# Patient Record
Sex: Female | Born: 1965 | Race: White | Hispanic: No | Marital: Married | Smoking: Former smoker
Health system: Southern US, Community
[De-identification: ages and names within clinical notes are randomized; demographics above are authoritative.]

## PROBLEM LIST (undated history)

## (undated) ENCOUNTER — Ambulatory Visit: Discharge status: Absent without leave | Payer: Medicare Other

## (undated) DIAGNOSIS — J439 Emphysema, unspecified: Secondary | ICD-10-CM

## (undated) DIAGNOSIS — M1990 Unspecified osteoarthritis, unspecified site: Secondary | ICD-10-CM

## (undated) DIAGNOSIS — N3010 Interstitial cystitis (chronic) without hematuria: Secondary | ICD-10-CM

## (undated) DIAGNOSIS — K219 Gastro-esophageal reflux disease without esophagitis: Secondary | ICD-10-CM

## (undated) DIAGNOSIS — F329 Major depressive disorder, single episode, unspecified: Secondary | ICD-10-CM

## (undated) DIAGNOSIS — F419 Anxiety disorder, unspecified: Secondary | ICD-10-CM

## (undated) DIAGNOSIS — F50029 Anorexia nervosa, binge eating/purging type, unspecified: Secondary | ICD-10-CM

## (undated) DIAGNOSIS — G709 Myoneural disorder, unspecified: Secondary | ICD-10-CM

## (undated) DIAGNOSIS — IMO0002 Reserved for concepts with insufficient information to code with codable children: Secondary | ICD-10-CM

## (undated) DIAGNOSIS — J069 Acute upper respiratory infection, unspecified: Secondary | ICD-10-CM

## (undated) DIAGNOSIS — L309 Dermatitis, unspecified: Secondary | ICD-10-CM

## (undated) DIAGNOSIS — N189 Chronic kidney disease, unspecified: Secondary | ICD-10-CM

## (undated) DIAGNOSIS — T7840XA Allergy, unspecified, initial encounter: Secondary | ICD-10-CM

## (undated) DIAGNOSIS — I10 Essential (primary) hypertension: Secondary | ICD-10-CM

## (undated) DIAGNOSIS — F319 Bipolar disorder, unspecified: Secondary | ICD-10-CM

## (undated) DIAGNOSIS — F429 Obsessive-compulsive disorder, unspecified: Secondary | ICD-10-CM

## (undated) DIAGNOSIS — K228 Other specified diseases of esophagus: Secondary | ICD-10-CM

## (undated) DIAGNOSIS — I499 Cardiac arrhythmia, unspecified: Secondary | ICD-10-CM

## (undated) DIAGNOSIS — F32A Depression, unspecified: Secondary | ICD-10-CM

## (undated) DIAGNOSIS — K2281 Esophageal polyp: Secondary | ICD-10-CM

## (undated) DIAGNOSIS — F5002 Anorexia nervosa, binge eating/purging type: Secondary | ICD-10-CM

## (undated) HISTORY — DX: Esophageal polyp: K22.81

## (undated) HISTORY — PX: CHOLECYSTECTOMY: SHX55

## (undated) HISTORY — DX: Anxiety disorder, unspecified: F41.9

## (undated) HISTORY — DX: Allergy, unspecified, initial encounter: T78.40XA

## (undated) HISTORY — PX: OTHER SURGICAL HISTORY: SHX169

## (undated) HISTORY — PX: BLADDER SURGERY: SHX569

## (undated) HISTORY — DX: Myoneural disorder, unspecified: G70.9

## (undated) HISTORY — DX: Reserved for concepts with insufficient information to code with codable children: IMO0002

## (undated) HISTORY — DX: Gastro-esophageal reflux disease without esophagitis: K21.9

## (undated) HISTORY — DX: Chronic kidney disease, unspecified: N18.9

## (undated) HISTORY — DX: Acute upper respiratory infection, unspecified: J06.9

## (undated) HISTORY — PX: VOCAL CORD LATERALIZATION, ENDOSCOPIC APPROACH W/ MLB: SHX2664

## (undated) HISTORY — DX: Emphysema, unspecified: J43.9

## (undated) HISTORY — DX: Depression, unspecified: F32.A

## (undated) HISTORY — PX: COSMETIC SURGERY: SHX468

## (undated) HISTORY — DX: Dermatitis, unspecified: L30.9

---
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
## 1997-11-20 ENCOUNTER — Other Ambulatory Visit: Admission: RE | Admit: 1997-11-20 | Discharge: 1997-11-20 | Payer: Self-pay | Admitting: Obstetrics & Gynecology

## 1998-04-23 ENCOUNTER — Encounter: Admission: RE | Admit: 1998-04-23 | Discharge: 1998-04-23 | Payer: Self-pay | Admitting: Otolaryngology

## 1998-04-29 ENCOUNTER — Ambulatory Visit (HOSPITAL_BASED_OUTPATIENT_CLINIC_OR_DEPARTMENT_OTHER): Admission: RE | Admit: 1998-04-29 | Discharge: 1998-04-29 | Payer: Self-pay | Admitting: Otolaryngology

## 1998-05-13 ENCOUNTER — Encounter: Admission: RE | Admit: 1998-05-13 | Discharge: 1998-06-07 | Payer: Self-pay | Admitting: Otolaryngology

## 1999-03-26 ENCOUNTER — Other Ambulatory Visit: Admission: RE | Admit: 1999-03-26 | Discharge: 1999-03-26 | Payer: Self-pay | Admitting: Obstetrics and Gynecology

## 2000-06-08 ENCOUNTER — Other Ambulatory Visit: Admission: RE | Admit: 2000-06-08 | Discharge: 2000-06-08 | Payer: Self-pay | Admitting: Obstetrics and Gynecology

## 2000-12-24 ENCOUNTER — Emergency Department (HOSPITAL_COMMUNITY): Admission: EM | Admit: 2000-12-24 | Discharge: 2000-12-25 | Payer: Self-pay

## 2003-04-03 ENCOUNTER — Other Ambulatory Visit: Admission: RE | Admit: 2003-04-03 | Discharge: 2003-04-03 | Payer: Self-pay | Admitting: Obstetrics & Gynecology

## 2003-08-13 ENCOUNTER — Encounter (INDEPENDENT_AMBULATORY_CARE_PROVIDER_SITE_OTHER): Payer: Self-pay | Admitting: Specialist

## 2003-08-13 ENCOUNTER — Ambulatory Visit (HOSPITAL_COMMUNITY): Admission: RE | Admit: 2003-08-13 | Discharge: 2003-08-13 | Payer: Self-pay | Admitting: Obstetrics & Gynecology

## 2004-07-04 ENCOUNTER — Other Ambulatory Visit: Admission: RE | Admit: 2004-07-04 | Discharge: 2004-07-04 | Payer: Self-pay | Admitting: Obstetrics & Gynecology

## 2004-11-28 ENCOUNTER — Encounter (INDEPENDENT_AMBULATORY_CARE_PROVIDER_SITE_OTHER): Payer: Self-pay | Admitting: Specialist

## 2004-11-28 ENCOUNTER — Ambulatory Visit (HOSPITAL_COMMUNITY): Admission: RE | Admit: 2004-11-28 | Discharge: 2004-11-28 | Payer: Self-pay | Admitting: Urology

## 2004-11-28 ENCOUNTER — Ambulatory Visit (HOSPITAL_BASED_OUTPATIENT_CLINIC_OR_DEPARTMENT_OTHER): Admission: RE | Admit: 2004-11-28 | Discharge: 2004-11-28 | Payer: Self-pay | Admitting: Urology

## 2005-10-22 ENCOUNTER — Emergency Department (HOSPITAL_COMMUNITY): Admission: EM | Admit: 2005-10-22 | Discharge: 2005-10-22 | Payer: Self-pay | Admitting: Emergency Medicine

## 2006-06-05 ENCOUNTER — Inpatient Hospital Stay (HOSPITAL_COMMUNITY): Admission: AD | Admit: 2006-06-05 | Discharge: 2006-06-06 | Payer: Self-pay | Admitting: Obstetrics & Gynecology

## 2006-06-05 ENCOUNTER — Encounter (INDEPENDENT_AMBULATORY_CARE_PROVIDER_SITE_OTHER): Payer: Self-pay | Admitting: Obstetrics & Gynecology

## 2008-04-05 ENCOUNTER — Encounter: Admission: RE | Admit: 2008-04-05 | Discharge: 2008-04-05 | Payer: Self-pay | Admitting: Family Medicine

## 2010-03-15 ENCOUNTER — Emergency Department (HOSPITAL_COMMUNITY)
Admission: EM | Admit: 2010-03-15 | Discharge: 2010-03-15 | Disposition: A | Discharge status: Home / Self Care | Payer: BC Managed Care – PPO | Attending: Emergency Medicine | Admitting: Emergency Medicine

## 2010-03-15 DIAGNOSIS — Z79899 Other long term (current) drug therapy: Secondary | ICD-10-CM | POA: Insufficient documentation

## 2010-03-15 DIAGNOSIS — H9209 Otalgia, unspecified ear: Secondary | ICD-10-CM | POA: Insufficient documentation

## 2010-03-15 DIAGNOSIS — F411 Generalized anxiety disorder: Secondary | ICD-10-CM | POA: Insufficient documentation

## 2010-06-06 NOTE — Op Note (Signed)
[name], [name]                ACCOUNT NO.:  [id number]   MEDICAL RECORD NO.:  [id number]          PATIENT TYPE:  AMB   LOCATION:  [facility]                         FACILITY:  [facility]   PHYSICIAN:  Mark C. Vernie Ammons, M.D.  DATE OF BIRTH:  [birth year]   DATE OF PROCEDURE:  11/28/2004  DATE OF DISCHARGE:                                 OPERATIVE REPORT   PREOPERATIVE DIAGNOSIS:  Interstitial cystitis.   POSTOPERATIVE DIAGNOSIS:  Interstitial cystitis.   PROCEDURE:  Cystoscopy, urethral dilatation, hydrodistention and bladder  biopsy.   SURGEON:  Mark C. Vernie Ammons, M.D.   ANESTHESIA:  General.   SPECIMENS:  Cold cup biopsies of the right wall, left wall and posterior  floor of the bladder.   BLOOD LOSS:  Minimal.   DRAINS:  None.   COMPLICATIONS:  None.   INDICATIONS:  The patient is a 45 year old white female with frequency,  bladder discomfort with filling and a PUF score of 17. Cystoscopically she  was found to have no bladder lesions in the office. Her urodynamic study  revealed sensory urgency with low bladder capacity of 261 mL. She has been  tried on oral agents as well as intravesical installations without  improvement. She is brought to the OR today for the above-named procedures.  The patient understands the risks, complications, alternatives and has  elected to proceed.   DESCRIPTION OF OPERATION:  After informed consent, the patient was brought  to the major OR, placed on the table, administered general anesthesia and  then moved to the dorsal lithotomy position. The genitalia was sterilely  prepped and draped and the 21-French cystoscope with 12 degrees lens was  inserted in the bladder. The bladder was fully inspected and initially noted  to be free of any tumor, stones or inflammatory lesions. I then filled her  bladder to capacity under anesthesia with the water placed at 100 cm.  Drainage of the bladder then revealed bloody terminal effluent and  reinspection  revealed glomerulations and petechial hemorrhages. The bladder  capacity under anesthesia was 475 mL. I then inserted a 24-French Foley  catheter and hydrodistended the bladder to 100 cmH2O pressure for  approximately 10 minutes. I then drained the bladder, reinspected and noted  no perforation. Cold cup biopsies were then used to obtain biopsies from the  left wall, right wall and posterior floor of the bladder. These locations  were then cauterized with the Bugbee electrocautery. The bladder was then  drained and the cystoscope removed. Urethral dilatation was then undertaken.   The urethra was dilated with Sissy Hoff sounds from 20 to 30 [place] without  difficulty. I then instilled a mixture of Pyridium, Marcaine and Kenalog in  the bladder and injected Kenalog in the subtrigonal region using digital  guidance through the vagina. Gauze packing was placed in the vagina and the  patient was awakened and taken to the recovery room in stable satisfactory  condition. She tolerated the procedure well with no intraoperative  complications. Of note, at the end of her hydrodistention at continuous 100  cmH2O pressure, her bladder capacity was 500 mL.  She will be given a prescription for Pyridium Plus #40 and Tylox for pain  #36. She will return to my office in 4 weeks, sooner if needed.      Mark C. Vernie Ammons, M.D.  Electronically Signed     MCO/MEDQ  D:  11/28/2004  T:  11/28/2004  Job:  [id number]

## 2010-06-06 NOTE — Op Note (Signed)
NAME:  Jennifer [name], Jennifer [name]                            ACCOUNT NO.:  [id number]   MEDICAL RECORD NO.:  [id number]                   PATIENT TYPE:  AMB   LOCATION:  [facility]                                  FACILITY:  [facility]   PHYSICIAN:  Ilda Mori, M.D.                DATE OF BIRTH:  Jul 17, 1965   DATE OF PROCEDURE:  08/13/2003  DATE OF DISCHARGE:                                 OPERATIVE REPORT   PREOPERATIVE DIAGNOSIS:  Menorrhagia, possible intrauterine polyps.   POSTOPERATIVE DIAGNOSIS:  Menorrhagia.   OPERATION PERFORMED:  Hysterectomy, dilation and curettage.   SURGEON:  Ilda Mori, M.D.   ANESTHESIA:  General.   ESTIMATED BLOOD LOSS:  10 mL.   FINDINGS:  Uterine cavity appeared normal.  There were no polyps or fibroids  seen.  The tubal ostia were noted and appeared to be perfectly free.  The  uterus sounded to 9 cm.   INDICATIONS FOR PROCEDURE:  The patient is a 45 year old gravida 1, ab 1 who  has noted increasingly heavy periods over the last three months.  Several of  these periods were so heavy that they required her leaving work and were  accompanied with significant flooding and soiling of her clothes and  actually at one point, her bathroom floor.  Her hemoglobin remained in the  11 to 12 range despite these heavy episodes of bleeding.  She was treated  with hormonal therapy with some result but she felt that she was still  bleeding excessively.  A sonohistogram was performed in the office which  revealed possible small intrauterine polyps.  On the basis of the heavy  bleeding despite hormonal therapy and the findings of possible intrauterine  polyps, decisions was made to proceed with hysteroscopy.   DESCRIPTION OF PROCEDURE:  The patient was taken to the operating room and  placed in the supine position where general anesthesia was induced.  She was  then placed in dorsal lithotomy position and prepped and draped in sterile  fashion.  Bladder was emptied.   Examination under anesthesia revealed a  normal sized anterior uterus.  The anterior lip of the cervix was grasped  with a single toothed tenaculum.  The internal os was dilated to 23 [place].  The uterus was sounded to 9 cm.  A hysteroscope was introduced into the  intrauterine cavity and the endometrial cavity was viewed with the findings  noted above.  Following this, a polyp forcep was introduced and indeed, no  polyps were found and a sharp curettage was used to systemically curettage  the entire endometrial cavity.  The procedure was then terminated and the  patient left the operating room in good condition.  Ilda Mori, M.D.    RK/MEDQ  D:  08/13/2003  T:  08/13/2003  Job:  [id number]

## 2010-08-01 ENCOUNTER — Other Ambulatory Visit: Payer: Self-pay | Admitting: Obstetrics & Gynecology

## 2011-10-09 ENCOUNTER — Emergency Department (HOSPITAL_COMMUNITY): Payer: BC Managed Care – PPO

## 2011-10-09 ENCOUNTER — Encounter (HOSPITAL_COMMUNITY): Payer: Self-pay

## 2011-10-09 ENCOUNTER — Emergency Department (HOSPITAL_COMMUNITY)
Admission: EM | Admit: 2011-10-09 | Discharge: 2011-10-09 | Disposition: A | Discharge status: Home / Self Care | Payer: BC Managed Care – PPO | Attending: Emergency Medicine | Admitting: Emergency Medicine

## 2011-10-09 DIAGNOSIS — D259 Leiomyoma of uterus, unspecified: Secondary | ICD-10-CM | POA: Insufficient documentation

## 2011-10-09 DIAGNOSIS — Z79899 Other long term (current) drug therapy: Secondary | ICD-10-CM | POA: Insufficient documentation

## 2011-10-09 DIAGNOSIS — Z9089 Acquired absence of other organs: Secondary | ICD-10-CM | POA: Insufficient documentation

## 2011-10-09 HISTORY — DX: Anorexia nervosa, binge eating/purging type: F50.02

## 2011-10-09 HISTORY — DX: Anorexia nervosa, binge eating/purging type, unspecified: F50.029

## 2011-10-09 LAB — URINALYSIS, ROUTINE W REFLEX MICROSCOPIC
Bilirubin Urine: NEGATIVE
Glucose, UA: NEGATIVE mg/dL
Hgb urine dipstick: NEGATIVE
Protein, ur: NEGATIVE mg/dL

## 2011-10-09 LAB — CBC WITH DIFFERENTIAL/PLATELET
Eosinophils Relative: 1 % (ref 0–5)
HCT: 36.9 % (ref 36.0–46.0)
Lymphocytes Relative: 21 % (ref 12–46)
Lymphs Abs: 1.8 10*3/uL (ref 0.7–4.0)
MCV: 88.7 fL (ref 78.0–100.0)
Monocytes Absolute: 0.7 10*3/uL (ref 0.1–1.0)
RBC: 4.16 MIL/uL (ref 3.87–5.11)
RDW: 14.1 % (ref 11.5–15.5)
WBC: 8.3 10*3/uL (ref 4.0–10.5)

## 2011-10-09 LAB — WET PREP, GENITAL
Trich, Wet Prep: NONE SEEN
Yeast Wet Prep HPF POC: NONE SEEN

## 2011-10-09 LAB — PREGNANCY, URINE: Preg Test, Ur: NEGATIVE

## 2011-10-09 MED ORDER — HYDROCODONE-ACETAMINOPHEN 5-325 MG PO TABS: 1.0000 | ORAL_TABLET | ORAL | Status: AC | PRN

## 2011-10-09 MED ORDER — MORPHINE SULFATE 4 MG/ML IJ SOLN
4.0000 mg | Freq: Once | INTRAMUSCULAR | Status: AC
  Administered 2011-10-09: 4 mg via INTRAVENOUS
  Filled 2011-10-09: qty 1

## 2011-10-09 MED ORDER — SODIUM CHLORIDE 0.9 % IV SOLN
INTRAVENOUS | Status: DC
  Administered 2011-10-09: 12:00:00 via INTRAVENOUS

## 2011-10-09 MED ORDER — ONDANSETRON HCL 4 MG/2ML IJ SOLN
4.0000 mg | Freq: Once | INTRAMUSCULAR | Status: AC
  Administered 2011-10-09: 4 mg via INTRAVENOUS
  Filled 2011-10-09: qty 2

## 2011-10-09 NOTE — ED Notes (Signed)
Pt reports that she has not had a period since June, started her period on Wednesday and since has been having severe pain, cramping.  Ab has been swelling x4 months, went from size 8 to 12.   Last bm last night.  Vomited after dinner last night.

## 2011-10-10 NOTE — ED Provider Notes (Signed)
History     CSN: [id number]  Arrival date & time 10/09/11  1025   First MD Initiated Contact with Patient 10/09/11 1115      Chief Complaint  Patient presents with  . Abdominal Pain    (Consider location/radiation/quality/duration/timing/severity/associated sxs/prior treatment) HPI Comments: Jennifer [name] presents with low pelvic pain and cramping with her menses which started 2 days ago.  She reports moderate menstrual flow along with several missed periods,  Her last occuring 3 months ago.  She has noticed increased bloating in her lower abdomen over the past several months.  She reports she has had irregular periods for the past year.  She denies pregnancy,  Fevers, chills, vaginal discharge and changes in bowels,  Her last bm was last night and normal.  She does have chronic intermittent urinary symptoms with flares of her interstitial cystitis which is currently quiescent.  She denies nausea at this time,  But did have an episode of emesis last night when her pelvic cramping was particularly severe.  She has constant pressure with intermittent sharp stabs of non radiating pain in her lower pelvis.  The history is provided by the spouse and the patient.    Past Medical History  Diagnosis Date  . Binge-eating and purging type anorexia nervosa     Past Surgical History  Procedure Date  . Cholecystectomy   . Vocal cord lateralization, endoscopic approach w/ mlb     No family history on file.  History  Substance Use Topics  . Smoking status: Current Every Day Smoker    Types: Cigarettes  . Smokeless tobacco: Not on file  . Alcohol Use: No    OB History    Grav Para Term Preterm Abortions TAB SAB Ect Mult Living                  Review of Systems  Constitutional: Negative for fever and unexpected weight change.  HENT: Negative for congestion, sore throat and neck pain.   Eyes: Negative.   Respiratory: Negative for chest tightness and shortness of breath.     Cardiovascular: Negative for chest pain.  Gastrointestinal: Positive for vomiting. Negative for nausea, abdominal pain, diarrhea and constipation.  Genitourinary: Positive for dysuria, menstrual problem and pelvic pain. Negative for vaginal discharge.  Musculoskeletal: Negative for joint swelling and arthralgias.  Skin: Negative.  Negative for rash and wound.  Neurological: Negative for dizziness, weakness, light-headedness, numbness and headaches.  Hematological: Negative.   Psychiatric/Behavioral: The patient is nervous/anxious.     Allergies  Other and Penicillins  Home Medications   Current Outpatient Rx  Name Route Sig Dispense Refill  . XANAX PO Oral Take 1 tablet by mouth daily as needed. For anxiety    . ARIPIPRAZOLE 20 MG PO TABS Oral Take 20 mg by mouth daily.    Marland Kitchen CLOMIPRAMINE HCL 50 MG PO CAPS Oral Take 2,500 mg by mouth every morning.    . IBUPROFEN 200 MG PO TABS Oral Take 400 mg by mouth every 6 (six) hours as needed. For pain    . TRANEXAMIC ACID 650 MG PO TABS Oral Take 1,300 mg by mouth 3 (three) times daily.    Marland Kitchen HYDROCODONE-ACETAMINOPHEN 5-325 MG PO TABS Oral Take 1 tablet by mouth every 4 (four) hours as needed for pain. 15 tablet 0    BP 126/74  Pulse 77  Temp 98.6 F (37 C) (Oral)  Resp 16  Ht 5' 4.25" (1.632 m)  Wt 154 lb (69.854 kg)  BMI 26.23 kg/m2  SpO2 99%  LMP 10/07/2011  Physical Exam  Nursing note and vitals reviewed. Constitutional: She appears well-developed and well-nourished.  HENT:  Head: Normocephalic and atraumatic.  Eyes: Conjunctivae normal are normal.  Neck: Normal range of motion.  Cardiovascular: Normal rate, regular rhythm, normal heart sounds and intact distal pulses.   Pulmonary/Chest: Effort normal and breath sounds normal. She has no wheezes.  Abdominal: Soft. Bowel sounds are normal. There is tenderness in the suprapubic area. There is no rebound and no CVA tenderness.  Genitourinary: Vagina normal. Uterus is enlarged  and tender. Cervix exhibits no motion tenderness. Right adnexum displays no mass, no tenderness and no fullness. Left adnexum displays no mass, no tenderness and no fullness.  Musculoskeletal: Normal range of motion.  Neurological: She is alert.  Skin: Skin is warm and dry.  Psychiatric: She has a normal mood and affect.    ED Course  Procedures (including critical care time)  Labs Reviewed  URINALYSIS, ROUTINE W REFLEX MICROSCOPIC - Abnormal; Notable for the following:    Color, Urine STRAW (*)     Specific Gravity, Urine <1.005 (*)     All other components within normal limits  WET PREP, GENITAL - Abnormal; Notable for the following:    WBC, Wet Prep HPF POC FEW (*)     All other components within normal limits  CBC WITH DIFFERENTIAL  RPR  PREGNANCY, URINE  GC/CHLAMYDIA PROBE AMP, GENITAL   US Transvaginal Non-ob  10/09/2011  *RADIOLOGY REPORT*  Clinical Data: Abdominal pain.  TRANSABDOMINAL AND TRANSVAGINAL ULTRASOUND OF PELVIS Technique:  Both transabdominal and transvaginal ultrasound examinations of the pelvis were performed. Transabdominal technique was performed for global imaging of the pelvis including uterus, ovaries, adnexal regions, and pelvic cul-de-sac.  It was necessary to proceed with endovaginal exam following the transabdominal exam to visualize the uterus and ovaries in better detail.  Comparison:  None  Findings:  Uterus: Large, rounded, heterogeneous mass in the anterior aspect of the uterine fundus.  This measures 4.7 x 3.5 x 3.5 cm in maximum dimensions.  This has prominent internal blood flow with color Doppler.  The remainder of the uterus has a normal appearance.  Endometrium: Normal, measuring 2.7 mm in maximum thickness transvaginally.  This is not in contact with the fundal uterine mass.  Right ovary:  Normal appearance/no adnexal mass  Left ovary: Normal appearance/no adnexal mass  Other findings: No free fluid  IMPRESSION: 4.7 cm fundal uterine mass, most  likely representing a fibroid. Otherwise, normal examination.   Original Report Authenticated By: Darrol Angel, M.D.    US Pelvis Complete  10/09/2011  *RADIOLOGY REPORT*  Clinical Data: Abdominal pain.  TRANSABDOMINAL AND TRANSVAGINAL ULTRASOUND OF PELVIS Technique:  Both transabdominal and transvaginal ultrasound examinations of the pelvis were performed. Transabdominal technique was performed for global imaging of the pelvis including uterus, ovaries, adnexal regions, and pelvic cul-de-sac.  It was necessary to proceed with endovaginal exam following the transabdominal exam to visualize the uterus and ovaries in better detail.  Comparison:  None  Findings:  Uterus: Large, rounded, heterogeneous mass in the anterior aspect of the uterine fundus.  This measures 4.7 x 3.5 x 3.5 cm in maximum dimensions.  This has prominent internal blood flow with color Doppler.  The remainder of the uterus has a normal appearance.  Endometrium: Normal, measuring 2.7 mm in maximum thickness transvaginally.  This is not in contact with the fundal uterine mass.  Right ovary:  Normal appearance/no adnexal  mass  Left ovary: Normal appearance/no adnexal mass  Other findings: No free fluid  IMPRESSION: 4.7 cm fundal uterine mass, most likely representing a fibroid. Otherwise, normal examination.   Original Report Authenticated By: Darrol Angel, M.D.      1. Fibroid, uterine       MDM  Pt prescribed hydrocodone prn pelvic pain.  She was referred back to her gynecologist Dr Arlyce Dice.  Pt states she actually has an appt with his office in 4 days for an [place].  Advised she call him to let him know the results of todays [place] and to f/u with him.  Pt agreeable and understands plan.        Burgess Amor, PA 10/10/11 0830

## 2011-10-12 NOTE — ED Provider Notes (Signed)
Medical screening examination/treatment/procedure(s) were performed by non-physician practitioner and as supervising physician I was immediately available for consultation/collaboration.   Gearldine Looney L Breyon Blass, MD 10/12/11 0941 

## 2013-02-15 ENCOUNTER — Ambulatory Visit (INDEPENDENT_AMBULATORY_CARE_PROVIDER_SITE_OTHER): Payer: BC Managed Care – PPO | Admitting: Surgery

## 2013-02-15 ENCOUNTER — Encounter (INDEPENDENT_AMBULATORY_CARE_PROVIDER_SITE_OTHER): Payer: Self-pay

## 2013-02-15 ENCOUNTER — Encounter (INDEPENDENT_AMBULATORY_CARE_PROVIDER_SITE_OTHER): Payer: Self-pay | Admitting: Surgery

## 2013-02-15 VITALS — BP 124/81 | HR 80 | Temp 97.5°F | Resp 16 | Ht 64.0 in | Wt 174.6 lb

## 2013-02-15 DIAGNOSIS — L0231 Cutaneous abscess of buttock: Secondary | ICD-10-CM | POA: Insufficient documentation

## 2013-02-15 DIAGNOSIS — L03317 Cellulitis of buttock: Principal | ICD-10-CM

## 2013-02-15 HISTORY — DX: Cellulitis of buttock: L02.31

## 2013-02-15 MED ORDER — DOXYCYCLINE HYCLATE 50 MG PO CAPS: 100.0000 mg | ORAL_CAPSULE | Freq: Two times a day (BID) | ORAL | Status: DC

## 2013-02-15 NOTE — Patient Instructions (Signed)
Remove packing in 48 hours and cover with maxipad.  Take antibiotics until gone. Return 2 weeks.  Keep covered with pad or gauze.

## 2013-02-15 NOTE — Progress Notes (Signed)
Subjective:     Patient ID: Jennifer [name], female   DOB: 04-11-1965, 48 y.o.   MRN: [id number]  HPI Patient sent request of Noelle Redmon, PA-Cdo to swelling, redness and fluctuance left buttock distal to the anal verge.this is been a recurrent problem for her over the last few months. She's been treated with multiple rounds of antibiotics which help but once she is off antibiotics here as well as up and becomes red and painful. She has no history of this ever being drained.   Review of Systems  Constitutional: Negative for fever and chills.  HENT: Negative.   Respiratory: Negative.   Cardiovascular: Negative.   Gastrointestinal: Positive for rectal pain.  Musculoskeletal: Negative.   Skin: Negative.   Hematological: Negative.   Psychiatric/Behavioral: Negative.        Objective:   Physical Exam  Constitutional: She is oriented to person, place, and time. She appears well-developed and well-nourished.  Neck: Neck supple.  Genitourinary:     Musculoskeletal: Normal range of motion.  Lymphadenopathy:    She has no cervical adenopathy.  Neurological: She is alert and oriented to person, place, and time.  Psychiatric: She has a normal mood and affect. Her behavior is normal. Judgment and thought content normal.       Assessment:     Cellulitis with abscess left buttock    Plan:     Recommended incision and drainage. Discussed with patient and husband. They agree to proceed. Using Betadine as prep, the area cleaned and 1% lidocaine with epinephrine used for local anesthesia. Incision made over the area cavity entered with turbid appearing fluid but not frank pus. This was packed with quarter-inch packing. Dry dressing applied. Patient instructed remove packing in 48 hours. Doxycycline 100 mg by mouth twice a day for next 10 days. Cover with dry dressing. Out of work for the next 2 days. Return to clinic in 2 weeks for recheck. Call with worsening symptoms, fever or chills.

## 2013-02-27 ENCOUNTER — Encounter (INDEPENDENT_AMBULATORY_CARE_PROVIDER_SITE_OTHER): Payer: BC Managed Care – PPO | Admitting: Surgery

## 2013-03-06 ENCOUNTER — Encounter (INDEPENDENT_AMBULATORY_CARE_PROVIDER_SITE_OTHER): Payer: BC Managed Care – PPO | Admitting: Surgery

## 2013-03-07 ENCOUNTER — Other Ambulatory Visit (INDEPENDENT_AMBULATORY_CARE_PROVIDER_SITE_OTHER): Payer: Self-pay | Admitting: Surgery

## 2013-03-07 ENCOUNTER — Telehealth (INDEPENDENT_AMBULATORY_CARE_PROVIDER_SITE_OTHER): Payer: Self-pay

## 2013-03-07 NOTE — Telephone Encounter (Signed)
Patient states her wound has healed and she did not ask for refill of Doxycyline. She is doing good

## 2013-03-10 ENCOUNTER — Encounter (INDEPENDENT_AMBULATORY_CARE_PROVIDER_SITE_OTHER): Payer: Self-pay | Admitting: Surgery

## 2013-03-10 ENCOUNTER — Ambulatory Visit (INDEPENDENT_AMBULATORY_CARE_PROVIDER_SITE_OTHER): Payer: BC Managed Care – PPO | Admitting: Surgery

## 2013-03-10 VITALS — BP 116/74 | HR 80 | Temp 99.1°F | Resp 14 | Ht 64.25 in | Wt 174.0 lb

## 2013-03-10 DIAGNOSIS — L03317 Cellulitis of buttock: Principal | ICD-10-CM

## 2013-03-10 DIAGNOSIS — L0231 Cutaneous abscess of buttock: Secondary | ICD-10-CM

## 2013-03-10 NOTE — Progress Notes (Signed)
Subjective:     Patient ID: Jennifer [name], female   DOB: Apr 05, 1965, 48 y.o.   MRN: [id number]  HPI Patient returns 2 weeks after incision and drainage left buttock abscess. He is having mild discomfort but it is much better. She has completed her antibiotics. She feels  fairly well.  Review of Systems  Constitutional: Negative for fever and chills.  Respiratory: Negative.   Cardiovascular: Negative.   Psychiatric/Behavioral: Negative.        Objective:   Physical Exam  Constitutional: She appears well-developed and well-nourished.  HENT:  Head: Normocephalic.  Nose: Nose normal.  Genitourinary:          Assessment:     Abscess cellulitis left buttock improved    Plan:     Return as needed.

## 2013-03-10 NOTE — Patient Instructions (Signed)
Return as needed

## 2013-05-23 ENCOUNTER — Other Ambulatory Visit: Payer: Self-pay

## 2013-08-08 ENCOUNTER — Ambulatory Visit (INDEPENDENT_AMBULATORY_CARE_PROVIDER_SITE_OTHER): Payer: BC Managed Care – PPO | Admitting: General Surgery

## 2013-08-08 ENCOUNTER — Encounter (INDEPENDENT_AMBULATORY_CARE_PROVIDER_SITE_OTHER): Payer: Self-pay | Admitting: General Surgery

## 2013-08-08 VITALS — BP 128/81 | HR 76 | Temp 97.1°F | Resp 18 | Ht 64.0 in | Wt 172.2 lb

## 2013-08-08 DIAGNOSIS — L03317 Cellulitis of buttock: Principal | ICD-10-CM

## 2013-08-08 DIAGNOSIS — L0231 Cutaneous abscess of buttock: Secondary | ICD-10-CM

## 2013-08-08 MED ORDER — HYDROCODONE-ACETAMINOPHEN 5-325 MG PO TABS: 1.0000 | ORAL_TABLET | Freq: Four times a day (QID) | ORAL | Status: DC | PRN

## 2013-08-08 MED ORDER — DOXYCYCLINE HYCLATE 100 MG PO TABS: 100.0000 mg | ORAL_TABLET | Freq: Two times a day (BID) | ORAL | Status: DC

## 2013-08-08 NOTE — Patient Instructions (Signed)
Abscess Care After An abscess (also called a boil or furuncle) is an infected area that contains a collection of pus. Signs and symptoms of an abscess include pain, tenderness, redness, or hardness, or you may feel a moveable soft area under your skin. An abscess can occur anywhere in the body. The infection may spread to surrounding tissues causing cellulitis. A cut (incision) by the surgeon was made over your abscess and the pus was drained out. Gauze may have been packed into the space to provide a drain that will allow the cavity to heal from the inside outwards. The boil may be painful for 5 to 7 days. Most people with a boil do not have high fevers. Your abscess, if seen early, may not have localized, and may not have been lanced. If not, another appointment may be required for this if it does not get better on its own or with medications.  HOME CARE INSTRUCTIONS   Only take over-the-counter or prescription medicines for pain, discomfort, or fever as directed by your caregiver.   When you bathe, soak and then remove gauze & iodoform packs. You may then wash the wound gently with mild soapy water. Cover with gauze and change daily. Expect some blood tinged drainage. Marland Kitchen   SEEK IMMEDIATE MEDICAL CARE IF:   You develop increased pain, swelling, redness, drainage, or bleeding in the wound site.   You develop signs of generalized infection including muscle aches, chills, fever, or a general ill feeling.   An oral temperature above 102 F (38.9 C) develops, not controlled by medication.  See your caregiver for a recheck if you develop any of the symptoms described above. If medications (antibiotics) were prescribed, take them as directed.

## 2013-08-08 NOTE — Progress Notes (Signed)
Subjective:     Patient ID: Jennifer [name], female   DOB: 04-12-1965, 48 y.o.   MRN: [id number]  HPI 48 year old Caucasian female comes in with recurrent left inferior buttock swelling and discomfort. She initially saw Dr. Brantley Stage back in February of this year. She underwent incision and drainage in the office. She states that this has been an ongoing problem off and on for the past 4 years. She'll have intermittent swelling and a fair amount of pain in the area. Initially she was managed with multiple courses of antibiotics over the years. Most the time it'll resolve with antibiotics. It always occurs in the same location. It has never spontaneously drained. She has never had a colonoscopy. She denies any diarrhea or constipation. She denies any melena or hematochezia. She endorses weight gain. There is no family history of Crohn's or ulcerative colitis. She has had problems with uterine fibroids.  PMHx, PSHx, SOCHx, FAMHx, ALL reviewed and unchanged  Review of Systems A 8 point Review of systems was performed and all systems are negative except for what is mentioned in the history of present illness     Objective:   Physical Exam  Vitals reviewed. Constitutional: She is oriented to person, place, and time. She appears well-developed and well-nourished. No distress.  HENT:  Head: Normocephalic and atraumatic.  Right Ear: External ear normal.  Left Ear: External ear normal.  Eyes: Conjunctivae are normal. No scleral icterus.  Neck: No tracheal deviation present.  Cardiovascular: Normal rate and normal heart sounds.   Pulmonary/Chest: Effort normal and breath sounds normal. No stridor. No respiratory distress. She has no wheezes.  Abdominal: Soft. She exhibits no distension. There is no tenderness. There is no rebound.    Genitourinary:     Old L inf buttock scar with surrounding cellulitis for about 3cm circumferentially. Induration inf and medial to old scar. DRE - good tone. No  masses. Anoscopy - mild hemorrhoids, no fluctuance  Musculoskeletal: She exhibits no edema and no tenderness.  Neurological: She is alert and oriented to person, place, and time. She exhibits normal muscle tone.  Skin: Skin is warm and dry. No rash noted. She is not diaphoretic. No erythema.  Psychiatric: She has a normal mood and affect. Her behavior is normal. Judgment and thought content normal.       Assessment:     Recurrent left buttock abscess with cellulitis     Plan:     Its location is at least 4 cm from the anus so it is a little bit far to be consistent with a fistula. Moreover it doesn't have the classic fistula appearance. It has never spontaneously drained. I'm not sure of the etiology of her recurrent cellulitis and abscess. She does have some hair in the area.   Given the cellulitis and induration I recommended lancing the area again. Although I did not expect much purulent fluid. After obtaining verbal consent the area was prepped with chlor prep. 5 cc of 1% Xylocaine with epinephrine mixed with sodium bicarbonate was infiltrated around the area of the old incision. Then using an 11 blade I incised the old incision through the deep dermis and subcutaneous tissue. I was able to using a small pair of hemostats open up into a large cavity. It tracked superiorly. There wasn't much drainage. No cultures were obtained. Quarter-inch iodoform gauze was packed into the wound and cover with a gauze. She was given wound care instructions.  I gave her a prescription for doxycycline for  one week and a prescription for hydrocodone. We discussed importance of avoiding constipation.  If this occurs again I think the best course of action would be to get an MRI of her pelvis.  Leighton Ruff. Redmond Pulling, MD, FACS General, Bariatric, & Minimally Invasive Surgery [facility], [place]

## 2013-08-10 ENCOUNTER — Telehealth (INDEPENDENT_AMBULATORY_CARE_PROVIDER_SITE_OTHER): Payer: Self-pay

## 2013-08-10 NOTE — Telephone Encounter (Signed)
Pt s/pRecurrent left buttock abscess with cellulitis. Pt was given a Rx for doxycycline. Pt states that she has not been able to take this she has been vomiting since she started. Pt would like to know if Dr Redmond Pulling can call her in another abx. Advised pt that I would send Dr Redmond Pulling and let him know of her complications. Pt verbalized understanding. We will contact her as soon as we receive a response.

## 2013-08-11 MED ORDER — SULFAMETHOXAZOLE-TRIMETHOPRIM 800-160 MG PO TABS: 1.0000 | ORAL_TABLET | Freq: Two times a day (BID) | ORAL | Status: DC

## 2013-08-11 NOTE — Addendum Note (Signed)
Addended by: Redmond Pulling Marry Kusch M on: 08/11/2013 08:40 AM   Modules accepted: Orders, Medications

## 2013-08-11 NOTE — Telephone Encounter (Signed)
Interesting - she took doxycycline awhile back and didn't report any problems. pls let pt know to stop doxycycline and I sent in Rx for bactrim

## 2013-08-11 NOTE — Telephone Encounter (Signed)
Informed pt of Dr Dois Davenport message below. Pt verbalized understanding

## 2013-09-15 ENCOUNTER — Encounter (INDEPENDENT_AMBULATORY_CARE_PROVIDER_SITE_OTHER): Payer: BC Managed Care – PPO | Admitting: General Surgery

## 2013-09-21 ENCOUNTER — Encounter (INDEPENDENT_AMBULATORY_CARE_PROVIDER_SITE_OTHER): Payer: BC Managed Care – PPO | Admitting: General Surgery

## 2014-01-16 ENCOUNTER — Encounter (HOSPITAL_COMMUNITY): Payer: Self-pay

## 2014-01-16 ENCOUNTER — Emergency Department (HOSPITAL_COMMUNITY)
Admission: EM | Admit: 2014-01-16 | Discharge: 2014-01-16 | Disposition: A | Discharge status: Home / Self Care | Payer: BC Managed Care – PPO | Attending: Emergency Medicine | Admitting: Emergency Medicine

## 2014-01-16 DIAGNOSIS — Z720 Tobacco use: Secondary | ICD-10-CM | POA: Diagnosis not present

## 2014-01-16 DIAGNOSIS — R55 Syncope and collapse: Secondary | ICD-10-CM | POA: Insufficient documentation

## 2014-01-16 DIAGNOSIS — Z8659 Personal history of other mental and behavioral disorders: Secondary | ICD-10-CM | POA: Diagnosis not present

## 2014-01-16 DIAGNOSIS — Z79899 Other long term (current) drug therapy: Secondary | ICD-10-CM | POA: Insufficient documentation

## 2014-01-16 DIAGNOSIS — Z880 Allergy status to penicillin: Secondary | ICD-10-CM | POA: Diagnosis not present

## 2014-01-16 DIAGNOSIS — R110 Nausea: Secondary | ICD-10-CM | POA: Insufficient documentation

## 2014-01-16 LAB — CBC WITH DIFFERENTIAL/PLATELET
Basophils Absolute: 0 10*3/uL (ref 0.0–0.1)
Basophils Relative: 0 % (ref 0–1)
EOS ABS: 0.2 10*3/uL (ref 0.0–0.7)
EOS PCT: 2 % (ref 0–5)
HEMATOCRIT: 41.6 % (ref 36.0–46.0)
HEMOGLOBIN: 13.8 g/dL (ref 12.0–15.0)
LYMPHS ABS: 3.8 10*3/uL (ref 0.7–4.0)
LYMPHS PCT: 40 % (ref 12–46)
MCH: 29.7 pg (ref 26.0–34.0)
MCHC: 33.2 g/dL (ref 30.0–36.0)
MCV: 89.5 fL (ref 78.0–100.0)
MONO ABS: 0.7 10*3/uL (ref 0.1–1.0)
MONOS PCT: 7 % (ref 3–12)
Neutro Abs: 4.9 10*3/uL (ref 1.7–7.7)
Neutrophils Relative %: 51 % (ref 43–77)
PLATELETS: 237 10*3/uL (ref 150–400)
RBC: 4.65 MIL/uL (ref 3.87–5.11)
RDW: 13.4 % (ref 11.5–15.5)
WBC: 9.6 10*3/uL (ref 4.0–10.5)

## 2014-01-16 LAB — COMPREHENSIVE METABOLIC PANEL
ALBUMIN: 4.7 g/dL (ref 3.5–5.2)
ALT: 19 U/L (ref 0–35)
AST: 24 U/L (ref 0–37)
Alkaline Phosphatase: 85 U/L (ref 39–117)
Anion gap: 6 (ref 5–15)
BUN: 20 mg/dL (ref 6–23)
CALCIUM: 9.7 mg/dL (ref 8.4–10.5)
CO2: 28 mmol/L (ref 19–32)
CREATININE: 0.74 mg/dL (ref 0.50–1.10)
Chloride: 105 mEq/L (ref 96–112)
GFR calc Af Amer: >90 mL/min (ref >90)
Glucose, Bld: 122 mg/dL — ABNORMAL HIGH (ref 70–99)
Potassium: 3.7 mmol/L (ref 3.5–5.1)
Sodium: 139 mmol/L (ref 135–145)
Total Bilirubin: 0.6 mg/dL (ref 0.3–1.2)
Total Protein: 7.6 g/dL (ref 6.0–8.3)

## 2014-01-16 LAB — TROPONIN I: Troponin I: <0.03 ng/mL (ref <0.031)

## 2014-01-16 NOTE — ED Provider Notes (Signed)
CSN: [id number]     Arrival date & time 01/16/14  1957 History   First MD Initiated Contact with Patient 01/16/14 2031     Chief Complaint  Patient presents with  . Loss of Consciousness     (Consider location/radiation/quality/duration/timing/severity/associated sxs/prior Treatment) Patient is a 48 y.o. female presenting with syncope. The history is provided by the patient.  Loss of Consciousness Associated symptoms: nausea   Associated symptoms: no chest pain, no confusion, no diaphoresis, no fever, no shortness of breath and no vomiting    patient passed out while visiting her mother in the emergency department. Patient started to feel very lightheaded put her head between her legs and passed out while sitting in a chair did not fall no head injury. Patient does states she didn't have much to eat today. It was associated with some nausea but no vomiting. Patient now feels fine. No chest pain no shortness of breath. No recent history of passing out.  Past Medical History  Diagnosis Date  . Binge-eating and purging type anorexia nervosa    Past Surgical History  Procedure Laterality Date  . Cholecystectomy    . Vocal cord lateralization, endoscopic approach w/ mlb    . Bladder surgery     No family history on file. History  Substance Use Topics  . Smoking status: Current Every Day Smoker    Types: Cigarettes  . Smokeless tobacco: Not on file  . Alcohol Use: No   OB History    No data available     Review of Systems  Constitutional: Negative for fever and diaphoresis.  HENT: Negative for congestion.   Eyes: Negative for visual disturbance.  Respiratory: Negative for shortness of breath.   Cardiovascular: Positive for syncope. Negative for chest pain.  Gastrointestinal: Positive for nausea. Negative for vomiting and abdominal pain.  Genitourinary: Negative for dysuria.  Musculoskeletal: Negative for back pain and neck pain.  Skin: Negative for rash.  Neurological:  Positive for syncope.  Hematological: Does not bruise/bleed easily.  Psychiatric/Behavioral: Negative for confusion.      Allergies  Codeine; Other; and Penicillins  Home Medications   Prior to Admission medications   Medication Sig Start Date End Date Taking? Authorizing Provider  ABILIFY 20 MG tablet Take 20 mg by mouth daily. 01/04/14  Yes Historical Provider, MD  ALPRAZolam Duanne Moron) 1 MG tablet Take 1 mg by mouth 3 (three) times daily as needed. For anxiety 11/26/13  Yes Historical Provider, MD  clomiPRAMINE (ANAFRANIL) 50 MG capsule Take 50 mg by mouth every morning.    Yes Historical Provider, MD  rizatriptan (MAXALT) 10 MG tablet Take 10 mg by mouth as needed. For migraine 12/20/13  Yes Historical Provider, MD  HYDROcodone-acetaminophen (NORCO) 5-325 MG per tablet Take 1-2 tablets by mouth every 6 (six) hours as needed. Patient not taking: Reported on 01/16/2014 08/08/13   Gayland Curry, MD  sulfamethoxazole-trimethoprim (BACTRIM DS,SEPTRA DS) 800-160 MG per tablet Take 1 tablet by mouth 2 (two) times daily. Patient not taking: Reported on 01/16/2014 08/11/13   Gayland Curry, MD   BP 111/71 mmHg  Pulse 83  Temp(Src) 97.5 F (36.4 C) (Oral)  Resp 22  Ht 5\' 4"  (1.626 m)  Wt 160 lb (72.576 kg)  BMI 27.45 kg/m2  SpO2 97% Physical Exam  Constitutional: She is oriented to person, place, and time. She appears well-developed and well-nourished. No distress.  HENT:  Head: Normocephalic and atraumatic.  Mouth/Throat: Oropharynx is clear and moist.  Eyes: Conjunctivae and  EOM are normal. Pupils are equal, round, and reactive to light.  Neck: Normal range of motion. Neck supple.  Cardiovascular: Normal rate, regular rhythm and normal heart sounds.   Pulmonary/Chest: Effort normal and breath sounds normal. No respiratory distress.  Abdominal: Soft. Bowel sounds are normal. She exhibits no distension.  Musculoskeletal: Normal range of motion.  Neurological: She is alert and oriented to  person, place, and time. No cranial nerve deficit. She exhibits normal muscle tone. Coordination normal.  Skin: Skin is warm. No rash noted.  Nursing note and vitals reviewed.   ED Course  Procedures (including critical care time) Labs Review Labs Reviewed  COMPREHENSIVE METABOLIC PANEL - Abnormal; Notable for the following:    Glucose, Bld 122 (*)    All other components within normal limits  CBC WITH DIFFERENTIAL  TROPONIN I  CBG MONITORING, ED   Results for orders placed or performed during the hospital encounter of 01/16/14  CBC with Differential  Result Value Ref Range   WBC 9.6 4.0 - 10.5 K/uL   RBC 4.65 3.87 - 5.11 MIL/uL   Hemoglobin 13.8 12.0 - 15.0 g/dL   HCT 41.6 36.0 - 46.0 %   MCV 89.5 78.0 - 100.0 fL   MCH 29.7 26.0 - 34.0 pg   MCHC 33.2 30.0 - 36.0 g/dL   RDW 13.4 11.5 - 15.5 %   Platelets 237 150 - 400 K/uL   Neutrophils Relative % 51 43 - 77 %   Neutro Abs 4.9 1.7 - 7.7 K/uL   Lymphocytes Relative 40 12 - 46 %   Lymphs Abs 3.8 0.7 - 4.0 K/uL   Monocytes Relative 7 3 - 12 %   Monocytes Absolute 0.7 0.1 - 1.0 K/uL   Eosinophils Relative 2 0 - 5 %   Eosinophils Absolute 0.2 0.0 - 0.7 K/uL   Basophils Relative 0 0 - 1 %   Basophils Absolute 0.0 0.0 - 0.1 K/uL  Comprehensive metabolic panel  Result Value Ref Range   Sodium 139 135 - 145 mmol/L   Potassium 3.7 3.5 - 5.1 mmol/L   Chloride 105 96 - 112 mEq/L   CO2 28 19 - 32 mmol/L   Glucose, Bld 122 (H) 70 - 99 mg/dL   BUN 20 6 - 23 mg/dL   Creatinine, Ser 0.74 0.50 - 1.10 mg/dL   Calcium 9.7 8.4 - 10.5 mg/dL   Total Protein 7.6 6.0 - 8.3 g/dL   Albumin 4.7 3.5 - 5.2 g/dL   AST 24 0 - 37 U/L   ALT 19 0 - 35 U/L   Alkaline Phosphatase 85 39 - 117 U/L   Total Bilirubin 0.6 0.3 - 1.2 mg/dL   GFR calc non Af Amer >90 >90 mL/min   GFR calc Af Amer >90 >90 mL/min   Anion gap 6 5 - 15  Troponin I  Result Value Ref Range   Troponin I <0.03 <0.031 ng/mL     Imaging Review No results found.   EKG  Interpretation   Date/Time:  Tuesday January 16 2014 20:03:28 EST Ventricular Rate:  72 PR Interval:  151 QRS Duration: 99 QT Interval:  388 QTC Calculation: 425 R Axis:   73 Text Interpretation:  Sinus rhythm No previous ECGs available wandering  baseline Confirmed by Vandana Haman  MD, Sarrinah Gardin [phone]) on 01/16/2014 8:48:02  PM      MDM   Final diagnoses:  Vasovagal syncope    The patient was visiting her mother in the emergency department who  is sick. When patient started to feel very lightheaded and dizzy put her head between her legs but did pass out while sitting in a chair. Patient also she did not eat much today. No chest pain no shortness of breath. No headache. No focal neuro findings. Patient felt fine before hand.  How workup seems to be consistent with a vasovagal syncopal episode. Patient's labs without any significant abnormalities. EKG without any abnormalities. Patient feeling much better with fluid hydration. Patient's vital signs are normal.    Fredia Sorrow, MD 01/16/14 [phone]

## 2014-01-16 NOTE — Discharge Instructions (Signed)
Return for any recurrent passing out. Go home and rest hydrate yourself well. Workup here in the emergency department was negative.

## 2014-01-16 NOTE — ED Notes (Signed)
Pt was visiting with her mother in another ed room when she said she started feeling lightheaded and dizzy and pt was able to be assisted into a chair.  Pt states she has not eaten much today.

## 2014-01-16 NOTE — ED Notes (Signed)
Patient verbalizes understanding of discharge instructions, home care, and follow up care if needed. Patient out of department at this time with spouse at this time.

## 2014-01-17 LAB — CBG MONITORING, ED: GLUCOSE-CAPILLARY: 95 mg/dL (ref 70–99)

## 2014-04-24 ENCOUNTER — Telehealth: Payer: Self-pay | Admitting: Cardiology

## 2014-04-24 NOTE — Telephone Encounter (Signed)
Received records from [place] for appointment with Dr Percival Spanish on 06/07/14.  Records given to [facility] (medical records) for Dr Hochrein's schedule on 06/07/14.  lp

## 2014-04-27 ENCOUNTER — Other Ambulatory Visit: Payer: Self-pay | Admitting: *Deleted

## 2014-04-27 DIAGNOSIS — R002 Palpitations: Secondary | ICD-10-CM

## 2014-04-27 NOTE — Progress Notes (Signed)
24 hour holter monitor ordered by Dr Kelton Pillar for palpitations

## 2014-05-01 ENCOUNTER — Encounter: Payer: Self-pay | Admitting: *Deleted

## 2014-05-01 ENCOUNTER — Other Ambulatory Visit: Payer: Self-pay

## 2014-05-01 ENCOUNTER — Encounter (INDEPENDENT_AMBULATORY_CARE_PROVIDER_SITE_OTHER): Payer: BC Managed Care – PPO

## 2014-05-01 DIAGNOSIS — R002 Palpitations: Secondary | ICD-10-CM

## 2014-05-01 NOTE — Progress Notes (Signed)
Patient ID: Jennifer [name], female   DOB: [birth year], 49 y.o.   MRN: [id number] [employer] 24 hour holter monitor applied to patient.

## 2014-05-07 ENCOUNTER — Telehealth: Payer: Self-pay | Admitting: Cardiology

## 2014-05-07 NOTE — Telephone Encounter (Signed)
Spoke with patient and informed her of preliminary monitor report and she voiced understanding. Informed her that we can see if she can get a sooner new patient appointment with either Dr. Percival Spanish or a cardiologist in [place] as this is most convenient for her.   Will defer message to scheduler and Dr. Percival Spanish to review

## 2014-05-07 NOTE — Telephone Encounter (Signed)
Patient returned call. She c/o chest pain on and off - she first noticed this a few weeks ago, which prompted a visit to her PCP. She noticed it yesterday and woke up with chest pain this AM. She describes the pain as a squeezing, tightness with occasional shooting pains thru chest. She woke up this AM and felt sick/nauseous and felt like her heart was racing. She took 1/2 xanax this AM. 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 She wore a 24 hour monitor last week, placed at [facility], for palpitations/tachycardia - she reports her HR was 160s-180s one day while she was at school working.   Will defer message to monitor team at [facility] office to obtain report and send to Dr. Percival Spanish for review.  Patient has not had a cardiology eval yet but has OV in May with Dr. Percival Spanish

## 2014-05-07 NOTE — Telephone Encounter (Signed)
Left message to call back  

## 2014-05-07 NOTE — Telephone Encounter (Signed)
Pt called in stating that she had a monitor placed last week and since then she has been having periodic chest pain. She would like to know if she should come here to be seen or her PCP. 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 Please call  Thanks

## 2014-05-07 NOTE — Telephone Encounter (Signed)
Spoke with monitor staff at [facility]. Monitor showed SR and some ST. Average HR 88bpm No PVCS/extra beats. No arrhythmias

## 2014-05-07 NOTE — Telephone Encounter (Signed)
LMTCB

## 2014-05-08 ENCOUNTER — Emergency Department (HOSPITAL_COMMUNITY): Payer: BC Managed Care – PPO

## 2014-05-08 ENCOUNTER — Encounter (HOSPITAL_COMMUNITY): Payer: Self-pay

## 2014-05-08 ENCOUNTER — Telehealth: Payer: Self-pay | Admitting: Cardiology

## 2014-05-08 ENCOUNTER — Emergency Department (HOSPITAL_COMMUNITY)
Admission: EM | Admit: 2014-05-08 | Discharge: 2014-05-08 | Disposition: A | Discharge status: Home / Self Care | Payer: BC Managed Care – PPO | Attending: Emergency Medicine | Admitting: Emergency Medicine

## 2014-05-08 DIAGNOSIS — R002 Palpitations: Secondary | ICD-10-CM | POA: Diagnosis not present

## 2014-05-08 DIAGNOSIS — R0789 Other chest pain: Secondary | ICD-10-CM | POA: Diagnosis not present

## 2014-05-08 DIAGNOSIS — Z79899 Other long term (current) drug therapy: Secondary | ICD-10-CM | POA: Diagnosis not present

## 2014-05-08 DIAGNOSIS — M79601 Pain in right arm: Secondary | ICD-10-CM | POA: Diagnosis not present

## 2014-05-08 DIAGNOSIS — Z880 Allergy status to penicillin: Secondary | ICD-10-CM | POA: Insufficient documentation

## 2014-05-08 DIAGNOSIS — Z720 Tobacco use: Secondary | ICD-10-CM | POA: Insufficient documentation

## 2014-05-08 DIAGNOSIS — R079 Chest pain, unspecified: Secondary | ICD-10-CM | POA: Diagnosis present

## 2014-05-08 DIAGNOSIS — Z8659 Personal history of other mental and behavioral disorders: Secondary | ICD-10-CM | POA: Diagnosis not present

## 2014-05-08 LAB — CBC
HCT: 38.6 % (ref 36.0–46.0)
Hemoglobin: 12.8 g/dL (ref 12.0–15.0)
MCH: 29.8 pg (ref 26.0–34.0)
MCHC: 33.2 g/dL (ref 30.0–36.0)
MCV: 90 fL (ref 78.0–100.0)
PLATELETS: 175 10*3/uL (ref 150–400)
RBC: 4.29 MIL/uL (ref 3.87–5.11)
RDW: 13.4 % (ref 11.5–15.5)
WBC: 7 10*3/uL (ref 4.0–10.5)

## 2014-05-08 LAB — BASIC METABOLIC PANEL
Anion gap: 8 (ref 5–15)
BUN: 14 mg/dL (ref 6–23)
CALCIUM: 9.2 mg/dL (ref 8.4–10.5)
CO2: 28 mmol/L (ref 19–32)
CREATININE: 0.64 mg/dL (ref 0.50–1.10)
Chloride: 103 mmol/L (ref 96–112)
Glucose, Bld: 96 mg/dL (ref 70–99)
Potassium: 3.9 mmol/L (ref 3.5–5.1)
Sodium: 139 mmol/L (ref 135–145)

## 2014-05-08 LAB — TROPONIN I
Troponin I: <0.03 ng/mL (ref <0.031)
Troponin I: <0.03 ng/mL (ref <0.031)

## 2014-05-08 LAB — TSH: TSH: 0.99 u[IU]/mL (ref 0.350–4.500)

## 2014-05-08 MED ORDER — METOPROLOL SUCCINATE ER 25 MG PO TB24
12.5000 mg | ORAL_TABLET | ORAL | Status: AC
  Administered 2014-05-08: 12.5 mg via ORAL
  Filled 2014-05-08: qty 0.5

## 2014-05-08 MED ORDER — METOPROLOL SUCCINATE ER 25 MG PO TB24: 12.5000 mg | ORAL_TABLET | Freq: Every day | ORAL | Status: DC

## 2014-05-08 NOTE — Telephone Encounter (Signed)
Closed encounter °

## 2014-05-08 NOTE — Discharge Instructions (Signed)
Chest Pain (Nonspecific) °It is often hard to give a specific diagnosis for the cause of chest pain. There is always a chance that your pain could be related to something serious, such as a heart attack or a blood clot in the lungs. You need to follow up with your health care provider for further evaluation. °CAUSES  °· Heartburn. °· Pneumonia or bronchitis. °· Anxiety or stress. °· Inflammation around your heart (pericarditis) or lung (pleuritis or pleurisy). °· A blood clot in the lung. °· A collapsed lung (pneumothorax). It can develop suddenly on its own (spontaneous pneumothorax) or from trauma to the chest. °· Shingles infection (herpes zoster virus). °The chest wall is composed of bones, muscles, and cartilage. Any of these can be the source of the pain. °· The bones can be bruised by injury. °· The muscles or cartilage can be strained by coughing or overwork. °· The cartilage can be affected by inflammation and become sore (costochondritis). °DIAGNOSIS  °Lab tests or other studies may be needed to find the cause of your pain. Your health care provider may have you take a test called an ambulatory electrocardiogram (ECG). An ECG records your heartbeat patterns over a 24-hour period. You may also have other tests, such as: °· Transthoracic echocardiogram (TTE). During echocardiography, sound waves are used to evaluate how blood flows through your heart. °· Transesophageal echocardiogram (TEE). °· Cardiac monitoring. This allows your health care provider to monitor your heart rate and rhythm in real time. °· Holter monitor. This is a portable device that records your heartbeat and can help diagnose heart arrhythmias. It allows your health care provider to track your heart activity for several days, if needed. °· Stress tests by exercise or by giving medicine that makes the heart beat faster. °TREATMENT  °· Treatment depends on what may be causing your chest pain. Treatment may include: °· Acid blockers for  heartburn. °· Anti-inflammatory medicine. °· Pain medicine for inflammatory conditions. °· Antibiotics if an infection is present. °· You may be advised to change lifestyle habits. This includes stopping smoking and avoiding alcohol, caffeine, and chocolate. °· You may be advised to keep your head raised (elevated) when sleeping. This reduces the chance of acid going backward from your stomach into your esophagus. °Most of the time, nonspecific chest pain will improve within 2-3 days with rest and mild pain medicine.  °HOME CARE INSTRUCTIONS  °· If antibiotics were prescribed, take them as directed. Finish them even if you start to feel better. °· For the next few days, avoid physical activities that bring on chest pain. Continue physical activities as directed. °· Do not use any tobacco products, including cigarettes, chewing tobacco, or electronic cigarettes. °· Avoid drinking alcohol. °· Only take medicine as directed by your health care provider. °· Follow your health care provider's suggestions for further testing if your chest pain does not go away. °· Keep any follow-up appointments you made. If you do not go to an appointment, you could develop lasting (chronic) problems with pain. If there is any problem keeping an appointment, call to reschedule. °SEEK MEDICAL CARE IF:  °· Your chest pain does not go away, even after treatment. °· You have a rash with blisters on your chest. °· You have a fever. °SEEK IMMEDIATE MEDICAL CARE IF:  °· You have increased chest pain or pain that spreads to your arm, neck, jaw, back, or abdomen. °· You have shortness of breath. °· You have an increasing cough, or you cough   up blood. °· You have severe back or abdominal pain. °· You feel nauseous or vomit. °· You have severe weakness. °· You faint. °· You have chills. °This is an emergency. Do not wait to see if the pain will go away. Get medical help at once. Call your local emergency services (911 in [place] Do not drive  yourself to the hospital. °MAKE SURE YOU:  °· Understand these instructions. °· Will watch your condition. °· Will get help right away if you are not doing well or get worse. °Document Released: 10/15/2004 Document Revised: 01/10/2013 Document Reviewed: 08/11/2007 °ExitCare® Patient Information ©2015 [employer]. This information is not intended to replace advice given to you by your health care provider. Make sure you discuss any questions you have with your health care provider. ° ° °Palpitations °A palpitation is the feeling that your heartbeat is irregular or is faster than normal. It may feel like your heart is fluttering or skipping a beat. Palpitations are usually not a serious problem. However, in some cases, you may need further medical evaluation. °CAUSES  °Palpitations can be caused by: °· Smoking. °· Caffeine or other stimulants, such as diet pills or energy drinks. °· Alcohol. °· Stress and anxiety. °· Strenuous physical activity. °· Fatigue. °· Certain medicines. °· Heart disease, especially if you have a history of irregular heart rhythms (arrhythmias), such as atrial fibrillation, atrial flutter, or supraventricular tachycardia. °· An improperly working pacemaker or defibrillator. °DIAGNOSIS  °To find the cause of your palpitations, your health care provider will take your medical history and perform a physical exam. Your health care provider may also have you take a test called an ambulatory electrocardiogram (ECG). An ECG records your heartbeat patterns over a 24-hour period. You may also have other tests, such as: °· Transthoracic echocardiogram (TTE). During echocardiography, sound waves are used to evaluate how blood flows through your heart. °· Transesophageal echocardiogram (TEE). °· Cardiac monitoring. This allows your health care provider to monitor your heart rate and rhythm in real time. °· Holter monitor. This is a portable device that records your heartbeat and can help diagnose heart  arrhythmias. It allows your health care provider to track your heart activity for several days, if needed. °· Stress tests by exercise or by giving medicine that makes the heart beat faster. °TREATMENT  °Treatment of palpitations depends on the cause of your symptoms and can vary greatly. Most cases of palpitations do not require any treatment other than time, relaxation, and monitoring your symptoms. Other causes, such as atrial fibrillation, atrial flutter, or supraventricular tachycardia, usually require further treatment. °HOME CARE INSTRUCTIONS  °· Avoid: °¨ Caffeinated coffee, tea, soft drinks, diet pills, and energy drinks. °¨ Chocolate. °¨ Alcohol. °· Stop smoking if you smoke. °· Reduce your stress and anxiety. Things that can help you relax include: °¨ A method of controlling things in your body, such as your heartbeats, with your mind (biofeedback). °¨ Yoga. °¨ Meditation. °¨ Physical activity such as swimming, jogging, or walking. °· Get plenty of rest and sleep. °SEEK MEDICAL CARE IF:  °· You continue to have a fast or irregular heartbeat beyond 24 hours. °· Your palpitations occur more often. °SEEK IMMEDIATE MEDICAL CARE IF: °· You have chest pain or shortness of breath. °· You have a severe headache. °· You feel dizzy or you faint. °MAKE SURE YOU: °· Understand these instructions. °· Will watch your condition. °· Will get help right away if you are not doing well or get worse. °  Document Released: 01/03/2000 Document Revised: 01/10/2013 Document Reviewed: 03/06/2011 °ExitCare® Patient Information ©2015 [employer]. This information is not intended to replace advice given to you by your health care provider. Make sure you discuss any questions you have with your health care provider. ° ° °

## 2014-05-08 NOTE — ED Notes (Signed)
MD at bedside. 

## 2014-05-08 NOTE — ED Notes (Signed)
Pt was giving 324 asa prior to arrival

## 2014-05-08 NOTE — ED Provider Notes (Signed)
CSN: [id number]     Arrival date & time 05/08/14  5465 History  This chart was scribe for Jola Schmidt, MD by Judithann Sauger, ED Scribe. The patient was seen in room APA09/APA09 and the patient's care was started at 10:53 AM.    Chief Complaint  Patient presents with  . Chest Pain   The history is provided by the patient. No language interpreter was used.   HPI Comments: AVORY RAHIMI is a 49 y.o. female who presents to the Emergency Department complaining of intermittent CP onset a few weeks ago.  Many of the episodes before his been associated with palpitations.  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 her heart rate was noted to be 180 and at another time her heart rate was noted to be 160.  She was recently placed on Holter monitor by cardiologist palpation and this demonstrated only normal sinus rhythm and sinus tachycardia without arrhythmias or palpitations per the medical record.  She has not seen the cardiologist to this point.  This was a 24-hour monitor.  She states that at this time she also did have some symptoms of chest pain but they were not as severe as many of her episodes that are associated with palpitations.  She does admit to caffeine intake.  She was recently on some sort of diet pill which she has since stopped.  She does not regularly exerts herself.  She smokes tobacco.  She denies high blood pressure, high cholesterol, diabetes.  No family history of early heart disease.  She denies associated shortness of breath.  No diaphoresis.  Denies nausea or vomiting.  She did have some chest discomfort today and was felt like a sharp sensation in her right chest with radiation to her right arm.  Currently she is feeling better in the emergency department.  Past Medical History  Diagnosis Date  . Binge-eating and purging type anorexia nervosa    Past Surgical History  Procedure Laterality Date  . Cholecystectomy     . Vocal cord lateralization, endoscopic approach w/ mlb    . Bladder surgery     No family history on file. History  Substance Use Topics  . Smoking status: Current Every Day Smoker    Types: Cigarettes  . Smokeless tobacco: Not on file  . Alcohol Use: No   OB History    No data available     Review of Systems  Constitutional: Negative for fever and chills.  Cardiovascular: Positive for chest pain.  Musculoskeletal: Positive for arthralgias (inner right arm pain).  All other systems reviewed and are negative.     Allergies  Codeine; Other; and Penicillins  Home Medications   Prior to Admission medications   Medication Sig Start Date End Date Taking? Authorizing Provider  ABILIFY 20 MG tablet Take 20 mg by mouth daily. 01/04/14   Historical Provider, MD  ALPRAZolam Duanne Moron) 1 MG tablet Take 1 mg by mouth 3 (three) times daily as needed. For anxiety 11/26/13   Historical Provider, MD  clomiPRAMINE (ANAFRANIL) 50 MG capsule Take 50 mg by mouth every morning.     Historical Provider, MD  HYDROcodone-acetaminophen (NORCO) 5-325 MG per tablet Take 1-2 tablets by mouth every 6 (six) hours as needed. Patient not taking: Reported on 01/16/2014 08/08/13   Greer Pickerel, MD  rizatriptan (MAXALT) 10 MG tablet Take 10 mg by mouth as needed. For migraine 12/20/13  Historical Provider, MD  sulfamethoxazole-trimethoprim (BACTRIM DS,SEPTRA DS) 800-160 MG per tablet Take 1 tablet by mouth 2 (two) times daily. Patient not taking: Reported on 01/16/2014 08/11/13   Greer Pickerel, MD   BP 116/84 mmHg  Pulse 69  Temp(Src) 98.8 F (37.1 C) (Oral)  Resp 23  Ht 5\' 4"  (1.626 m)  Wt 150 lb (68.04 kg)  BMI 25.73 kg/m2  SpO2 100% Physical Exam  Constitutional: She is oriented to person, place, and time. She appears well-developed and well-nourished. No distress.  HENT:  Head: Normocephalic and atraumatic.  Eyes: EOM are normal.  Neck: Normal range of motion.  Cardiovascular: Normal rate, regular  rhythm and normal heart sounds.   Pulmonary/Chest: Effort normal and breath sounds normal.  Abdominal: Soft. She exhibits no distension. There is no tenderness.  Musculoskeletal: Normal range of motion.  Neurological: She is alert and oriented to person, place, and time.  Skin: Skin is warm and dry.  Psychiatric: She has a normal mood and affect. Judgment normal.  Nursing note and vitals reviewed.   ED Course  Procedures (including critical care time) DIAGNOSTIC STUDIES: Oxygen Saturation is 100% on RA, normal by my interpretation.    COORDINATION OF CARE: 11:06 AM- Pt advised of plan for treatment and pt agrees.    Labs Review Labs Reviewed  CBC  BASIC METABOLIC PANEL  TROPONIN I  TSH  TROPONIN I    Imaging Review Dg Chest 2 View  05/08/2014   CLINICAL DATA:  Right arm pain, mid sternal pain, smoker  EXAM: CHEST  2 VIEW  COMPARISON:  03/26/2009  FINDINGS: Cardiomediastinal silhouette is stable. No acute infiltrate or pleural effusion. No pulmonary edema. Mild degenerative changes mid thoracic spine.  IMPRESSION: No active cardiopulmonary disease.   Electronically Signed   By: Lahoma Crocker M.D.   On: 05/08/2014 11:25  I personally reviewed the imaging tests through PACS system I reviewed available ER/hospitalization records through the EMR    EKG Interpretation   Date/Time:  Tuesday May 08 2014 10:05:09 EDT Ventricular Rate:  70 PR Interval:  158 QRS Duration: 79 QT Interval:  374 QTC Calculation: 403 R Axis:   77 Text Interpretation:  Sinus rhythm No significant change was found  Confirmed by Salem Mastrogiovanni  MD, Myrakle Wingler ([id number]) on 05/08/2014 10:37:57 AM      MDM   Final diagnoses:  Chest pain, unspecified chest pain type  Palpitations    Palpitations are concerning for something such as A. fib or a flutter.  Despite her negative Holter monitor which is only worn for 24 hours I'll place her on low-dose beta blocker for rate control.  I recommended that she stop all  caffeine.  I recommended she not eat chocolate and avoid all other stimulants.  Patient understands importance of follow-up with a cardiologist.  She likely needs an echocardiogram as an outpatient.  She understands to return to the ER for any new or worsening symptoms.  No indication for additional workup in the emergency department today.  No indication for admission.  Patient is feeling great at this time and has no symptoms.  Doubt PE.  Doubt cardiomyopathy.  I personally performed the services described in this documentation, which was scribed in my presence. The recorded information has been reviewed and is accurate.      Jola Schmidt, MD 05/08/14 1538

## 2014-05-08 NOTE — ED Notes (Signed)
Complain of pain in right arm, shoulder and mid sternal chest pain. Pt states she has a history of a fast heart beat and is wearing a monitor now

## 2014-05-09 NOTE — Telephone Encounter (Signed)
Patient is scheduled as new patient with Dr. Bronson Ing on 05/25/14 LMTCB to follow up with patient on ED visit

## 2014-05-09 NOTE — Telephone Encounter (Signed)
OK to follow in [place] if that appt is sooner.   She was in the ED yesterday and there were no acute findings.

## 2014-05-25 ENCOUNTER — Ambulatory Visit (INDEPENDENT_AMBULATORY_CARE_PROVIDER_SITE_OTHER): Payer: BC Managed Care – PPO | Admitting: Cardiovascular Disease

## 2014-05-25 ENCOUNTER — Encounter: Payer: Self-pay | Admitting: Cardiovascular Disease

## 2014-05-25 VITALS — BP 108/78 | HR 86 | Ht 64.0 in | Wt 149.2 lb

## 2014-05-25 DIAGNOSIS — R002 Palpitations: Secondary | ICD-10-CM

## 2014-05-25 DIAGNOSIS — R000 Tachycardia, unspecified: Secondary | ICD-10-CM | POA: Diagnosis not present

## 2014-05-25 DIAGNOSIS — K219 Gastro-esophageal reflux disease without esophagitis: Secondary | ICD-10-CM

## 2014-05-25 DIAGNOSIS — R079 Chest pain, unspecified: Secondary | ICD-10-CM

## 2014-05-25 DIAGNOSIS — R0789 Other chest pain: Secondary | ICD-10-CM | POA: Insufficient documentation

## 2014-05-25 DIAGNOSIS — R072 Precordial pain: Secondary | ICD-10-CM

## 2014-05-25 DIAGNOSIS — F502 Bulimia nervosa: Secondary | ICD-10-CM

## 2014-05-25 HISTORY — DX: Palpitations: R00.2

## 2014-05-25 MED ORDER — OMEPRAZOLE 20 MG PO CPDR: 20.0000 mg | DELAYED_RELEASE_CAPSULE | Freq: Every day | ORAL | Status: DC

## 2014-05-25 MED ORDER — METOPROLOL SUCCINATE ER 25 MG PO TB24
25.0000 mg | ORAL_TABLET | Freq: Every day | ORAL | Status: DC
Start: 2014-05-25 — End: 2015-03-27

## 2014-05-25 NOTE — Progress Notes (Signed)
Patient ID: Jennifer [name], female   DOB: 03-16-1965, 49 y.o.   MRN: [id number]       CARDIOLOGY CONSULT NOTE  Patient ID: Jennifer [name] MRN: [id number] DOB/AGE: 1965/05/13 49 y.o.  Admit date: (Not on file) Primary Physician REDMON,NOELLE, PA-C  Reason for Consultation: palpitations and chest pain  HPI: The patient is a 49 year old woman who has been referred for chest pain and palpitations. She was recently evaluated in the ED on 4/19. ECG demonstrated normal sinus rhythm with no ischemic ST segment or T-wave abnormalities, nor any arrhythmias. She recently wore a Holter monitor which demonstrated sinus rhythm and sinus tachycardia with a maximum heart rate of 148 bpm. I reviewed all relevant labs and studies. Troponin, CBC, and basic metabolic panel as well as TSH were all normal. Chest x-ray was also normal.  Her problems began several months ago. She has been having retrosternal chest pain radiating to the right side of the chest and rapid palpitations. 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 Over spring break, she experienced retrosternal chest pain which she describes as a "severe burning sensation". A fellow teacher attached a Smart watch which demonstrated her heart rate to be 180 bpm. 20 minutes later it was 160 bpm. She has sought treatment at an urgent care on at least 2 occasions.   She's been having associated nausea and vomiting. Last Christmas, she had a syncopal episode when she took her mother to the ED but she had been on a diet pill and hadn't eaten in over 5 hours. She tells me that she had severe bulimia back in high school and had it for at least 10 years. Her palpitations usually occur in the morning. She used to drink 4 cups of caffeinated coffee daily but now only drinks decaffeinated coffee. She was recent started on Toprol-XL 12.5 mg daily and has had some alleviation of her symptoms.  Had GERD as a teenager and a hiatal hernia as a child.   Soc:  Married. 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 She smokes 4-5 cigarettes daily.     Allergies  Allergen Reactions  . Codeine Hives and Shortness Of Breath  . Meloxicam Other (See Comments)    Possible chest tightness - instructed by MD not to take  . Other Nausea Only    CODIENE-unable to enter under "Agent" for some reason  . Penicillins Hives, Swelling and Rash    Current Outpatient Prescriptions  Medication Sig Dispense Refill  . ABILIFY 20 MG tablet Take 20 mg by mouth daily.    Marland Kitchen ALPRAZolam (XANAX) 1 MG tablet Take 0.5-1 mg by mouth 3 (three) times daily as needed. For anxiety    . clomiPRAMINE (ANAFRANIL) 50 MG capsule Take 50 mg by mouth every morning.     . metoprolol succinate (TOPROL-XL) 25 MG 24 hr tablet Take 0.5 tablets (12.5 mg total) by mouth daily. 30 tablet 0  . Multiple Vitamin (MULTI-VITAMIN PO) Take 1 tablet by mouth daily.    . naproxen sodium (ANAPROX) 220 MG tablet Take 440 mg by mouth daily as needed (headache / pain).    . rizatriptan (MAXALT) 10 MG tablet Take 10 mg by mouth as needed. For migraine     No current facility-administered medications for this visit.    Past Medical History  Diagnosis Date  . Binge-eating and purging type anorexia nervosa     Past Surgical History  Procedure Laterality Date  . Cholecystectomy    . Vocal  cord lateralization, endoscopic approach w/ mlb    . Bladder surgery      History   Social History  . Marital Status: Married    Spouse Name: N/A  . Number of Children: N/A  . Years of Education: N/A   Occupational History  . Not on file.   Social History Main Topics  . Smoking status: Current Every Day Smoker -- 0.25 packs/day    Types: Cigarettes    Start date: 01/20/1988  . Smokeless tobacco: Not on file  . Alcohol Use: No  . Drug Use: No  . Sexual Activity: Yes    Birth Control/ Protection: None   Other Topics Concern  . Not on file   Social History Narrative     No family history of  premature CAD in 1st degree relatives.  Prior to Admission medications   Medication Sig Start Date End Date Taking? Authorizing Provider  ABILIFY 20 MG tablet Take 20 mg by mouth daily. 01/04/14  Yes Historical Provider, MD  ALPRAZolam Duanne Moron) 1 MG tablet Take 0.5-1 mg by mouth 3 (three) times daily as needed. For anxiety 11/26/13  Yes Historical Provider, MD  clomiPRAMINE (ANAFRANIL) 50 MG capsule Take 50 mg by mouth every morning.    Yes Historical Provider, MD  metoprolol succinate (TOPROL-XL) 25 MG 24 hr tablet Take 0.5 tablets (12.5 mg total) by mouth daily. 05/08/14  Yes Jola Schmidt, MD  Multiple Vitamin (MULTI-VITAMIN PO) Take 1 tablet by mouth daily.   Yes Historical Provider, MD  naproxen sodium (ANAPROX) 220 MG tablet Take 440 mg by mouth daily as needed (headache / pain).   Yes Historical Provider, MD  rizatriptan (MAXALT) 10 MG tablet Take 10 mg by mouth as needed. For migraine 12/20/13  Yes Historical Provider, MD     Review of systems complete and found to be negative unless listed above in HPI     Physical exam Blood pressure 108/78, pulse 86, height 5\' 4"  (1.626 m), weight 149 lb 3.2 oz (67.677 kg), SpO2 99 %. General: NAD Neck: No JVD, no thyromegaly or thyroid nodule.  Lungs: Faint, intermittent, expiratory wheezes at bases. CV: Nondisplaced PMI. Regular rate and rhythm, normal S1/S2, no S3/S4, no murmur.  No peripheral edema.  No carotid bruit.  Normal pedal pulses.  Abdomen: Soft, nontender, no hepatosplenomegaly, no distention.  Skin: Intact without lesions or rashes.  Neurologic: Alert and oriented x 3.  Psych: Normal affect. Extremities: No clubbing or cyanosis.  HEENT: Normal.   ECG: Most recent ECG reviewed.  Labs:   Lab Results  Component Value Date   WBC 7.0 05/08/2014   HGB 12.8 05/08/2014   HCT 38.6 05/08/2014   MCV 90.0 05/08/2014   PLT 175 05/08/2014   No results for input(s): NA, K, CL, CO2, BUN, CREATININE, CALCIUM, PROT, BILITOT, ALKPHOS,  ALT, AST, GLUCOSE in the last 168 hours.  Invalid input(s): LABALBU Lab Results  Component Value Date   TROPONINI <0.03 05/08/2014   No results found for: CHOL No results found for: HDL No results found for: LDLCALC No results found for: TRIG No results found for: CHOLHDL No results found for: LDLDIRECT       Studies: No results found.  ASSESSMENT AND PLAN:  1. Tachycardia/palpitations: While Holter monitoring demonstrated sinus tachycardia, I will obtain a 30 day event monitor to evaluate for other supraventricular arrhythmias such as AVNRT. 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 To help better alleviate her symptoms, I will increase Toprol-XL to  25 mg daily. I will obtain an echocardiogram to evaluate for structural heart disease.  2. Chest pain: Atypical for an ischemic heart disease etiology. Only risk factor appears to be tobacco abuse. She has been using naproxen chronically and had bulimia for at least 10 years. She likely has esophageal and gastric inflammation and perhaps ulceration. I will refer her to gastroenterology for an EGD. I will start low-dose omeprazole 20 mg daily. I have asked her to stop naproxen.  3. Tobacco abuse: Cessation counseling given.  Dispo: f/u 6-8 weeks.   Signed: Kate Sable, M.D., F.A.C.C.  05/25/2014, 8:26 AM

## 2014-05-25 NOTE — Patient Instructions (Signed)
Your physician recommends that you schedule a follow-up appointment in: June with Dr. Bronson Ing  Your physician has recommended that you wear an event monitor for 30 days. Event monitors are medical devices that record the heart's electrical activity. Doctors most often [place] these monitors to diagnose arrhythmias. Arrhythmias are problems with the speed or rhythm of the heartbeat. The monitor is a small, portable device. You can wear one while you do your normal daily activities. This is usually used to diagnose what is causing palpitations/syncope (passing out).  You have been referred to GI   Your physician has requested that you have an echocardiogram. Echocardiography is a painless test that uses sound waves to create images of your heart. It provides your doctor with information about the size and shape of your heart and how well your heart's chambers and valves are working. This procedure takes approximately one hour. There are no restrictions for this procedure.  Your physician has recommended you make the following change in your medication:   Stop taking  Naproxen Increase Toprol-XL to 25 mg Daily  Thank you for choosing [facility]!

## 2014-05-28 ENCOUNTER — Encounter: Payer: Self-pay | Admitting: Internal Medicine

## 2014-05-29 ENCOUNTER — Ambulatory Visit: Payer: BC Managed Care – PPO

## 2014-06-07 ENCOUNTER — Ambulatory Visit: Payer: BC Managed Care – PPO | Admitting: Cardiology

## 2014-06-12 ENCOUNTER — Ambulatory Visit: Payer: BC Managed Care – PPO | Admitting: Nurse Practitioner

## 2014-07-09 ENCOUNTER — Ambulatory Visit (HOSPITAL_COMMUNITY)
Admission: RE | Admit: 2014-07-09 | Discharge: 2014-07-09 | Disposition: A | Discharge status: Home / Self Care | Payer: BC Managed Care – PPO | Source: Ambulatory Visit | Attending: Cardiovascular Disease | Admitting: Cardiovascular Disease

## 2014-07-09 DIAGNOSIS — R079 Chest pain, unspecified: Secondary | ICD-10-CM

## 2014-07-09 DIAGNOSIS — R000 Tachycardia, unspecified: Secondary | ICD-10-CM

## 2014-07-12 ENCOUNTER — Ambulatory Visit: Payer: BC Managed Care – PPO | Admitting: Cardiovascular Disease

## 2014-07-18 ENCOUNTER — Ambulatory Visit: Payer: BC Managed Care – PPO | Admitting: Nurse Practitioner

## 2014-07-19 ENCOUNTER — Ambulatory Visit: Payer: BC Managed Care – PPO | Admitting: Cardiovascular Disease

## 2014-08-28 ENCOUNTER — Encounter: Payer: Self-pay | Admitting: Cardiovascular Disease

## 2014-08-28 ENCOUNTER — Ambulatory Visit (INDEPENDENT_AMBULATORY_CARE_PROVIDER_SITE_OTHER): Payer: BC Managed Care – PPO | Admitting: Cardiovascular Disease

## 2014-08-28 ENCOUNTER — Encounter (INDEPENDENT_AMBULATORY_CARE_PROVIDER_SITE_OTHER): Payer: Self-pay

## 2014-08-28 VITALS — BP 122/72 | HR 94 | Ht 64.0 in | Wt 144.0 lb

## 2014-08-28 DIAGNOSIS — R072 Precordial pain: Secondary | ICD-10-CM

## 2014-08-28 DIAGNOSIS — K219 Gastro-esophageal reflux disease without esophagitis: Secondary | ICD-10-CM

## 2014-08-28 DIAGNOSIS — R002 Palpitations: Secondary | ICD-10-CM | POA: Diagnosis not present

## 2014-08-28 DIAGNOSIS — R000 Tachycardia, unspecified: Secondary | ICD-10-CM

## 2014-08-28 DIAGNOSIS — Z716 Tobacco abuse counseling: Secondary | ICD-10-CM

## 2014-08-28 NOTE — Patient Instructions (Signed)
Your physician wants you to follow-up in: 6 months with Dr Virgina Jock will receive a reminder letter in the mail two months in advance. If you don't receive a letter, please call our office to schedule the follow-up appointment.    Your physician recommends that you continue on your current medications as directed. Please refer to the Current Medication list given to you today.  Thank you for choosing [facility] !        Smoking Cessation Quitting smoking is important to your health and has many advantages. However, it is not always easy to quit since nicotine is a very addictive drug. Oftentimes, people try 3 times or more before being able to quit. This document explains the best ways for you to prepare to quit smoking. Quitting takes hard work and a lot of effort, but you can do it. ADVANTAGES OF QUITTING SMOKING  You will live longer, feel better, and live better.  Your body will feel the impact of quitting smoking almost immediately.  Within 20 minutes, blood pressure decreases. Your pulse returns to its normal level.  After 8 hours, carbon monoxide levels in the blood return to normal. Your oxygen level increases.  After 24 hours, the chance of having a heart attack starts to decrease. Your breath, hair, and body stop smelling like smoke.  After 48 hours, damaged nerve endings begin to recover. Your sense of taste and smell improve.  After 72 hours, the body is virtually free of nicotine. Your bronchial tubes relax and breathing becomes easier.  After 2 to 12 weeks, lungs can hold more air. Exercise becomes easier and circulation improves.  The risk of having a heart attack, stroke, cancer, or lung disease is greatly reduced.  After 1 year, the risk of coronary heart disease is cut in half.  After 5 years, the risk of stroke falls to the same as a nonsmoker.  After 10 years, the risk of lung cancer is cut in half and the risk of other cancers  decreases significantly.  After 15 years, the risk of coronary heart disease drops, usually to the level of a nonsmoker.  If you are pregnant, quitting smoking will improve your chances of having a healthy baby.  The people you live with, especially any children, will be healthier.  You will have extra money to spend on things other than cigarettes. QUESTIONS TO THINK ABOUT BEFORE ATTEMPTING TO QUIT You may want to talk about your answers with your health care provider.  Why do you want to quit?  If you tried to quit in the past, what helped and what did not?  What will be the most difficult situations for you after you quit? How will you plan to handle them?  Who can help you through the tough times? Your family? Friends? A health care provider?  What pleasures do you get from smoking? What ways can you still get pleasure if you quit? Here are some questions to ask your health care provider:  How can you help me to be successful at quitting?  What medicine do you think would be best for me and how should I take it?  What should I do if I need more help?  What is smoking withdrawal like? How can I get information on withdrawal? GET READY  Set a quit date.  Change your environment by getting rid of all cigarettes, ashtrays, matches, and lighters in your home, car, or work. Do not let people smoke  in your home.  Review your past attempts to quit. Think about what worked and what did not. GET SUPPORT AND ENCOURAGEMENT You have a better chance of being successful if you have help. You can get support in many ways.  Tell your family, friends, and coworkers that you are going to quit and need their support. Ask them not to smoke around you.  Get individual, group, or telephone counseling and support. Programs are available at [employer] and health centers. Call your local health department for information about programs in your area.  Spiritual beliefs and practices may  help some smokers quit.  Download a "quit meter" on your computer to keep track of quit statistics, such as how long you have gone without smoking, cigarettes not smoked, and money saved.  Get a self-help book about quitting smoking and staying off tobacco. Lovelady yourself from urges to smoke. Talk to someone, go for a walk, or occupy your time with a task.  Change your normal routine. Take a different route to work. Drink tea instead of coffee. Eat breakfast in a different place.  Reduce your stress. Take a hot bath, exercise, or read a book.  Plan something enjoyable to do every day. Reward yourself for not smoking.  Explore interactive web-based programs that specialize in helping you quit. GET MEDICINE AND USE IT CORRECTLY Medicines can help you stop smoking and decrease the urge to smoke. Combining medicine with the above behavioral methods and support can greatly increase your chances of successfully quitting smoking.  Nicotine replacement therapy helps deliver nicotine to your body without the negative effects and risks of smoking. Nicotine replacement therapy includes nicotine gum, lozenges, inhalers, nasal sprays, and skin patches. Some may be available over-the-counter and others require a prescription.  Antidepressant medicine helps people abstain from smoking, but how this works is unknown. This medicine is available by prescription.  Nicotinic receptor partial agonist medicine simulates the effect of nicotine in your brain. This medicine is available by prescription. Ask your health care provider for advice about which medicines to use and how to use them based on your health history. Your health care provider will tell you what side effects to look out for if you choose to be on a medicine or therapy. Carefully read the information on the package. Do not use any other product containing nicotine while using a nicotine replacement product.    RELAPSE OR DIFFICULT SITUATIONS Most relapses occur within the first 3 months after quitting. Do not be discouraged if you start smoking again. Remember, most people try several times before finally quitting. You may have symptoms of withdrawal because your body is used to nicotine. You may crave cigarettes, be irritable, feel very hungry, cough often, get headaches, or have difficulty concentrating. The withdrawal symptoms are only temporary. They are strongest when you first quit, but they will go away within 10-14 days. To reduce the chances of relapse, try to:  Avoid drinking alcohol. Drinking lowers your chances of successfully quitting.  Reduce the amount of caffeine you consume. Once you quit smoking, the amount of caffeine in your body increases and can give you symptoms, such as a rapid heartbeat, sweating, and anxiety.  Avoid smokers because they can make you want to smoke.  Do not let weight gain distract you. Many smokers will gain weight when they quit, usually less than 10 pounds. Eat a healthy diet and stay active. You can always lose the weight gained  after you quit.  Find ways to improve your mood other than smoking. FOR MORE INFORMATION  www.smokefree.gov  Document Released: 12/30/2000 Document Revised: 05/22/2013 Document Reviewed: 04/16/2011 [facility] Patient Information 2015 [place], [place]. This information is not intended to replace advice given to you by your health care provider. Make sure you discuss any questions you have with your health care provider.

## 2014-08-28 NOTE — Progress Notes (Signed)
Patient ID: Jennifer [name], female   DOB: 15-Apr-1965, 49 y.o.   MRN: [id number]      SUBJECTIVE: The patient returns for follow-up after undergoing cardiovascular testing performed for the evaluation of tachycardia and palpitations.  30 day event monitoring demonstrated sinus rhythm and sinus tachycardia.  Echocardiogram demonstrated normal left ventricular function, LVEF 55-60%.  She is feeling much better now and seldom has palpitations. Since she stopped taking naproxen and started taking Tylenol as I had suggested, she no longer has chest pain. She tells me she has varicose veins in both legs but denies a dragging sensation and also denies claudication pain. She sometimes has foot numbness.   She wants to try to quit smoking as she understands its deleterious effects on her health.   Review of Systems: As per "subjective", otherwise negative.  Allergies  Allergen Reactions  . Codeine Hives and Shortness Of Breath  . Meloxicam Other (See Comments)    Possible chest tightness - instructed by MD not to take  . Other Nausea Only    CODIENE-unable to enter under "Agent" for some reason  . Penicillins Hives, Swelling and Rash    Current Outpatient Prescriptions  Medication Sig Dispense Refill  . ABILIFY 20 MG tablet Take 20 mg by mouth daily.    . clomiPRAMINE (ANAFRANIL) 50 MG capsule Take 50 mg by mouth every morning.     . metoprolol succinate (TOPROL-XL) 25 MG 24 hr tablet Take 1 tablet (25 mg total) by mouth daily. 30 tablet 11  . Multiple Vitamin (MULTI-VITAMIN PO) Take 1 tablet by mouth daily.    Marland Kitchen omeprazole (PRILOSEC) 20 MG capsule Take 1 capsule (20 mg total) by mouth daily. 30 capsule 11  . rizatriptan (MAXALT) 10 MG tablet Take 10 mg by mouth as needed. For migraine    . ALPRAZolam (XANAX) 1 MG tablet Take 0.5-1 mg by mouth 3 (three) times daily as needed. For anxiety    . RESTASIS 0.05 % ophthalmic emulsion Place 1 drop into both eyes 2 (two) times daily.      No  current facility-administered medications for this visit.    Past Medical History  Diagnosis Date  . Binge-eating and purging type anorexia nervosa     Past Surgical History  Procedure Laterality Date  . Cholecystectomy    . Vocal cord lateralization, endoscopic approach w/ mlb    . Bladder surgery      History   Social History  . Marital Status: Married    Spouse Name: N/A  . Number of Children: N/A  . Years of Education: N/A   Occupational History  . Not on file.   Social History Main Topics  . Smoking status: Current Every Day Smoker -- 0.25 packs/day    Types: Cigarettes    Start date: 01/20/1988  . Smokeless tobacco: Never Used  . Alcohol Use: No  . Drug Use: No  . Sexual Activity: Yes    Birth Control/ Protection: None   Other Topics Concern  . Not on file   Social History Narrative     Filed Vitals:   08/28/14 0821  BP: 122/72  Pulse: 94  Height: 5\' 4"  (1.626 m)  Weight: 144 lb (65.318 kg)  SpO2: 99%    PHYSICAL EXAM General: NAD HEENT: Normal. Neck: No JVD, no thyromegaly. Lungs: Clear to auscultation bilaterally with normal respiratory effort. CV: Nondisplaced PMI.  Regular rate and rhythm, normal S1/S2, no S3/S4, no murmur. No pretibial or periankle edema.  No  carotid bruit.  Normal pedal pulses.  Abdomen: Soft, nontender, no hepatosplenomegaly, no distention.  Neurologic: Alert and oriented x 3.  Psych: Normal affect. Skin: Normal. Musculoskeletal: Normal range of motion, no gross deformities. Extremities: No clubbing or cyanosis.   ECG: Most recent ECG reviewed.      ASSESSMENT AND PLAN: 1. 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 To help better alleviate her symptoms, I previously increased Toprol-XL to 25 mg daily. She is now symptomatically stable.  2. Chest pain/GERD: Prior symptoms were atypical for an ischemic heart disease etiology. Only risk factor appears to be tobacco  abuse. Since she stopped taking naproxen and started taking Tylenol as I had suggested, she no longer has chest pain. I will continue low-dose omeprazole 20 mg daily.  3. Tobacco abuse: Cessation counseling given. 15 minutes spent doing face-to-face counseling. Has tried Wellbutrin and nicotine patches in the past without success. Appears motivated to quit. 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  Dispo: f/u 6 months.   Kate Sable, M.D., F.A.C.C.

## 2014-10-08 ENCOUNTER — Telehealth: Payer: Self-pay | Admitting: Cardiovascular Disease

## 2014-10-08 NOTE — Telephone Encounter (Signed)
Pt states that since she has started taking her Metoprolol she is extremely exhausted, pls leave a message on VM if pt does not answer

## 2014-10-08 NOTE — Telephone Encounter (Signed)
PT states that she wakes up after sleeping well the night before still exhausted, and by 10-11 am in teh morning she feels like she is going to pass out from exhaustion. 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 She is so tired she cannot function/ concentrate. She does not have any other symptoms- She would like to know if she can take something else for her racing heart. Please advise.

## 2014-10-09 MED ORDER — DILTIAZEM HCL 30 MG PO TABS: 30.0000 mg | ORAL_TABLET | Freq: Two times a day (BID) | ORAL | Status: DC

## 2014-10-09 NOTE — Telephone Encounter (Signed)
Spoke with Pt. All instructions given. Pt. Voiced understanding. Pt. States she will call the office next week.

## 2014-10-09 NOTE — Telephone Encounter (Signed)
Can stop metoprolol, try diltiazem 30mg  twice a day and call us back in a week with update   Zandra Abts MD

## 2014-10-17 ENCOUNTER — Telehealth: Payer: Self-pay | Admitting: Cardiovascular Disease

## 2014-10-17 NOTE — Telephone Encounter (Signed)
Pt called stating that the medication switch to Cardizem is making her feel better

## 2014-10-17 NOTE — Telephone Encounter (Signed)
Will forward to Dr. Koneswaran  

## 2014-10-23 ENCOUNTER — Telehealth: Payer: Self-pay

## 2014-10-23 NOTE — Telephone Encounter (Signed)
Pt called to say she is very anxious, took her Xanax and was worried the cardizem was making her tired. 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

## 2014-10-23 NOTE — Telephone Encounter (Signed)
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

## 2015-02-21 ENCOUNTER — Ambulatory Visit: Payer: BC Managed Care – PPO | Admitting: Cardiovascular Disease

## 2015-03-27 ENCOUNTER — Ambulatory Visit (INDEPENDENT_AMBULATORY_CARE_PROVIDER_SITE_OTHER): Payer: BC Managed Care – PPO | Admitting: Cardiovascular Disease

## 2015-03-27 VITALS — BP 108/62 | HR 90 | Ht 64.0 in | Wt 140.0 lb

## 2015-03-27 DIAGNOSIS — R000 Tachycardia, unspecified: Secondary | ICD-10-CM | POA: Diagnosis not present

## 2015-03-27 DIAGNOSIS — R002 Palpitations: Secondary | ICD-10-CM | POA: Diagnosis not present

## 2015-03-27 DIAGNOSIS — F419 Anxiety disorder, unspecified: Secondary | ICD-10-CM

## 2015-03-27 DIAGNOSIS — F418 Other specified anxiety disorders: Secondary | ICD-10-CM | POA: Diagnosis not present

## 2015-03-27 DIAGNOSIS — F329 Major depressive disorder, single episode, unspecified: Secondary | ICD-10-CM

## 2015-03-27 DIAGNOSIS — F32A Depression, unspecified: Secondary | ICD-10-CM

## 2015-03-27 NOTE — Progress Notes (Signed)
Patient ID: Jennifer [name], female   DOB: [birth year], 50 y.o.   MRN: [id number]      SUBJECTIVE: The patient returns for follow-up of tachycardia and palpitations. She has been undergoing a lot of anxiety and stress and is depressed. She thinks her heart is okay. She has taken a 6 month leave of absence from work. She is tearful. She denies suicidal ideation.   Review of Systems: As per "subjective", otherwise negative.  Allergies  Allergen Reactions  . Codeine Hives and Shortness Of Breath  . Meloxicam Other (See Comments)    Possible chest tightness - instructed by MD not to take  . Other Nausea Only    CODIENE-unable to enter under "Agent" for some reason  . Penicillins Hives, Swelling and Rash    Current Outpatient Prescriptions  Medication Sig Dispense Refill  . ABILIFY 20 MG tablet Take 20 mg by mouth daily.    Marland Kitchen ALPRAZolam (XANAX) 1 MG tablet Take 0.5-1 mg by mouth 3 (three) times daily as needed. For anxiety    . clomiPRAMINE (ANAFRANIL) 50 MG capsule Take 50 mg by mouth every morning.     . diltiazem (CARDIZEM) 30 MG tablet Take 1 tablet (30 mg total) by mouth 2 (two) times daily. 60 tablet 6  . Multiple Vitamin (MULTI-VITAMIN PO) Take 1 tablet by mouth daily.    . RESTASIS 0.05 % ophthalmic emulsion Place 1 drop into both eyes 2 (two) times daily.     . rizatriptan (MAXALT) 10 MG tablet Take 10 mg by mouth as needed. For migraine     No current facility-administered medications for this visit.    Past Medical History  Diagnosis Date  . Binge-eating and purging type anorexia nervosa     Past Surgical History  Procedure Laterality Date  . Cholecystectomy    . Vocal cord lateralization, endoscopic approach w/ mlb    . Bladder surgery      Social History   Social History  . Marital Status: Married    Spouse Name: N/A  . Number of Children: N/A  . Years of Education: N/A   Occupational History  . Not on file.   Social History Main Topics  . Smoking status:  Current Every Day Smoker -- 0.25 packs/day    Types: Cigarettes    Start date: 01/20/1988  . Smokeless tobacco: Never Used  . Alcohol Use: No  . Drug Use: No  . Sexual Activity: Yes    Birth Control/ Protection: None   Other Topics Concern  . Not on file   Social History Narrative     Filed Vitals:   03/27/15 1608  BP: 108/62  Pulse: 90  Height: 5\' 4"  (1.626 m)  Weight: 140 lb (63.504 kg)  SpO2: 95%    PHYSICAL EXAM General: Tearful. HEENT: Normal. Neck: No JVD, no thyromegaly. Lungs: Clear to auscultation bilaterally with normal respiratory effort. CV: Nondisplaced PMI.  Regular rate and rhythm, normal S1/S2, no S3/S4, no murmur. No pretibial or periankle edema.    Abdomen: Soft, nontender, no distention.  Neurologic: Alert and oriented.  Psych: Tearful. Skin: Normal. Musculoskeletal: No gross deformities.  ECG: Most recent ECG reviewed.      ASSESSMENT AND PLAN: 1. Tachycardia/palpitations: It appears symptoms are due to severe anxiety and stress. 30 day event monitor demonstrated an inappropriate sinus tachycardia in 05/2014. To help better alleviate her symptoms, I previously increased Toprol-XL to 25 mg daily. She initially felt better but then complained of fatigue. This was  then switched to diltiazem but this caused her to feel fatigued as well. She is seeking to get treatment for anxiety and stress.  Dispo: f/u prn.  Kate Sable, M.D., F.A.C.C.

## 2015-03-27 NOTE — Patient Instructions (Signed)
Medication Instructions:  Your physician recommends that you continue on your current medications as directed. Please refer to the Current Medication list given to you today.   Labwork: NONE  Testing/Procedures: NONE  Follow-Up: Your physician recommends that you schedule a follow-up appointment in: AS NEEDED      Any Other Special Instructions Will Be Listed Below (If Applicable).     If you need a refill on your cardiac medications before your next appointment, please call your pharmacy.   

## 2015-12-03 ENCOUNTER — Encounter (HOSPITAL_COMMUNITY): Payer: Self-pay

## 2015-12-03 ENCOUNTER — Emergency Department (HOSPITAL_COMMUNITY)
Admission: EM | Admit: 2015-12-03 | Discharge: 2015-12-04 | Disposition: A | Discharge status: Other Acute Inpatient Hospital | Payer: BC Managed Care – PPO | Attending: Emergency Medicine | Admitting: Emergency Medicine

## 2015-12-03 DIAGNOSIS — Z79899 Other long term (current) drug therapy: Secondary | ICD-10-CM | POA: Diagnosis not present

## 2015-12-03 DIAGNOSIS — F3010 Manic episode without psychotic symptoms, unspecified: Secondary | ICD-10-CM

## 2015-12-03 DIAGNOSIS — F3160 Bipolar disorder, current episode mixed, unspecified: Secondary | ICD-10-CM | POA: Diagnosis not present

## 2015-12-03 DIAGNOSIS — F3112 Bipolar disorder, current episode manic without psychotic features, moderate: Secondary | ICD-10-CM | POA: Diagnosis not present

## 2015-12-03 DIAGNOSIS — Z9889 Other specified postprocedural states: Secondary | ICD-10-CM | POA: Diagnosis not present

## 2015-12-03 DIAGNOSIS — F419 Anxiety disorder, unspecified: Secondary | ICD-10-CM

## 2015-12-03 DIAGNOSIS — F429 Obsessive-compulsive disorder, unspecified: Secondary | ICD-10-CM | POA: Diagnosis not present

## 2015-12-03 DIAGNOSIS — R945 Abnormal results of liver function studies: Secondary | ICD-10-CM | POA: Diagnosis not present

## 2015-12-03 DIAGNOSIS — F319 Bipolar disorder, unspecified: Secondary | ICD-10-CM | POA: Diagnosis present

## 2015-12-03 DIAGNOSIS — F17210 Nicotine dependence, cigarettes, uncomplicated: Secondary | ICD-10-CM | POA: Insufficient documentation

## 2015-12-03 DIAGNOSIS — R7989 Other specified abnormal findings of blood chemistry: Secondary | ICD-10-CM

## 2015-12-03 DIAGNOSIS — Z720 Tobacco use: Secondary | ICD-10-CM

## 2015-12-03 HISTORY — DX: Bipolar disorder, unspecified: F31.9

## 2015-12-03 HISTORY — DX: Obsessive-compulsive disorder, unspecified: F42.9

## 2015-12-03 HISTORY — DX: Interstitial cystitis (chronic) without hematuria: N30.10

## 2015-12-03 LAB — CBC WITH DIFFERENTIAL/PLATELET
BASOS ABS: 0 10*3/uL (ref 0.0–0.1)
Basophils Relative: 0 %
EOS ABS: 0.2 10*3/uL (ref 0.0–0.7)
EOS PCT: 3 %
HCT: 39 % (ref 36.0–46.0)
Hemoglobin: 13.3 g/dL (ref 12.0–15.0)
Lymphocytes Relative: 30 %
Lymphs Abs: 2.1 10*3/uL (ref 0.7–4.0)
MCH: 30.3 pg (ref 26.0–34.0)
MCHC: 34.1 g/dL (ref 30.0–36.0)
MCV: 88.8 fL (ref 78.0–100.0)
Monocytes Absolute: 0.6 10*3/uL (ref 0.1–1.0)
Monocytes Relative: 8 %
Neutro Abs: 4.3 10*3/uL (ref 1.7–7.7)
Neutrophils Relative %: 59 %
PLATELETS: 163 10*3/uL (ref 150–400)
RBC: 4.39 MIL/uL (ref 3.87–5.11)
RDW: 13.3 % (ref 11.5–15.5)
WBC: 7.2 10*3/uL (ref 4.0–10.5)

## 2015-12-03 LAB — ACETAMINOPHEN LEVEL

## 2015-12-03 LAB — COMPREHENSIVE METABOLIC PANEL
ALT: 76 U/L — AB (ref 14–54)
AST: 48 U/L — AB (ref 15–41)
Albumin: 4.4 g/dL (ref 3.5–5.0)
Alkaline Phosphatase: 68 U/L (ref 38–126)
Anion gap: 11 (ref 5–15)
BUN: 14 mg/dL (ref 6–20)
CHLORIDE: 103 mmol/L (ref 101–111)
CO2: 24 mmol/L (ref 22–32)
CREATININE: 0.67 mg/dL (ref 0.44–1.00)
Calcium: 9.5 mg/dL (ref 8.9–10.3)
GFR calc Af Amer: >60 mL/min (ref >60)
GFR calc non Af Amer: >60 mL/min (ref >60)
Glucose, Bld: 119 mg/dL — ABNORMAL HIGH (ref 65–99)
Potassium: 3.9 mmol/L (ref 3.5–5.1)
SODIUM: 138 mmol/L (ref 135–145)
Total Bilirubin: 0.5 mg/dL (ref 0.3–1.2)
Total Protein: 7 g/dL (ref 6.5–8.1)

## 2015-12-03 LAB — SALICYLATE LEVEL

## 2015-12-03 LAB — RAPID URINE DRUG SCREEN, HOSP PERFORMED
AMPHETAMINES: NOT DETECTED
Barbiturates: NOT DETECTED
Benzodiazepines: POSITIVE — AB
Cocaine: NOT DETECTED
OPIATES: NOT DETECTED
TETRAHYDROCANNABINOL: NOT DETECTED

## 2015-12-03 LAB — HCG, QUANTITATIVE, PREGNANCY: hCG, Beta Chain, Quant, S: 1 m[IU]/mL (ref <5)

## 2015-12-03 LAB — ETHANOL: Alcohol, Ethyl (B): <5 mg/dL (ref <5)

## 2015-12-03 MED ORDER — ZOLPIDEM TARTRATE 5 MG PO TABS: 5.0000 mg | ORAL_TABLET | Freq: Every evening | ORAL | Status: DC | PRN

## 2015-12-03 MED ORDER — ASENAPINE MALEATE 5 MG SL SUBL: 10.0000 mg | SUBLINGUAL_TABLET | Freq: Once | SUBLINGUAL | Status: DC

## 2015-12-03 MED ORDER — ALPRAZOLAM 0.5 MG PO TABS
0.5000 mg | ORAL_TABLET | Freq: Three times a day (TID) | ORAL | Status: DC | PRN
  Administered 2015-12-03: 0.5 mg via ORAL
  Filled 2015-12-03: qty 1

## 2015-12-03 MED ORDER — ALUM & MAG HYDROXIDE-SIMETH 200-200-20 MG/5ML PO SUSP: 30.0000 mL | ORAL | Status: DC | PRN

## 2015-12-03 MED ORDER — CLOMIPRAMINE HCL 25 MG PO CAPS
100.0000 mg | ORAL_CAPSULE | Freq: Every morning | ORAL | Status: DC
  Administered 2015-12-04: 100 mg via ORAL
  Filled 2015-12-03: qty 4

## 2015-12-03 MED ORDER — LORAZEPAM 1 MG PO TABS
1.0000 mg | ORAL_TABLET | Freq: Four times a day (QID) | ORAL | Status: DC | PRN
  Administered 2015-12-04: 1 mg via ORAL
  Filled 2015-12-03: qty 1

## 2015-12-03 MED ORDER — ARIPIPRAZOLE 10 MG PO TABS
10.0000 mg | ORAL_TABLET | ORAL | Status: DC
  Administered 2015-12-04: 10 mg via ORAL
  Filled 2015-12-03: qty 1

## 2015-12-03 MED ORDER — FLUVOXAMINE MALEATE 100 MG PO TABS
100.0000 mg | ORAL_TABLET | Freq: Every day | ORAL | Status: DC
  Administered 2015-12-04: 100 mg via ORAL
  Filled 2015-12-03: qty 1

## 2015-12-03 MED ORDER — NAPROXEN SODIUM 275 MG PO TABS
412.5000 mg | ORAL_TABLET | Freq: Two times a day (BID) | ORAL | Status: DC | PRN
  Filled 2015-12-03: qty 2

## 2015-12-03 MED ORDER — ACETAMINOPHEN 325 MG PO TABS: 650.0000 mg | ORAL_TABLET | ORAL | Status: DC | PRN

## 2015-12-03 MED ORDER — HYDROXYZINE HCL 25 MG PO TABS
25.0000 mg | ORAL_TABLET | Freq: Four times a day (QID) | ORAL | Status: DC | PRN
  Administered 2015-12-04: 50 mg via ORAL
  Filled 2015-12-03: qty 2

## 2015-12-03 MED ORDER — ONDANSETRON HCL 4 MG PO TABS: 4.0000 mg | ORAL_TABLET | Freq: Three times a day (TID) | ORAL | Status: DC | PRN

## 2015-12-03 MED ORDER — ASENAPINE MALEATE 5 MG SL SUBL
5.0000 mg | SUBLINGUAL_TABLET | Freq: Once | SUBLINGUAL | Status: AC
  Administered 2015-12-03: 5 mg via SUBLINGUAL
  Filled 2015-12-03: qty 1

## 2015-12-03 MED ORDER — NICOTINE 21 MG/24HR TD PT24
21.0000 mg | MEDICATED_PATCH | Freq: Every day | TRANSDERMAL | Status: DC
  Filled 2015-12-03: qty 1

## 2015-12-03 MED ORDER — CYCLOSPORINE 0.05 % OP EMUL
1.0000 [drp] | Freq: Two times a day (BID) | OPHTHALMIC | Status: DC
  Administered 2015-12-03 – 2015-12-04 (×2): 1 [drp] via OPHTHALMIC
  Filled 2015-12-03 (×3): qty 1

## 2015-12-03 MED ORDER — HYDROXYZINE PAMOATE 25 MG PO CAPS
25.0000 mg | ORAL_CAPSULE | Freq: Four times a day (QID) | ORAL | Status: DC | PRN
  Filled 2015-12-03: qty 2

## 2015-12-03 NOTE — ED Notes (Signed)
Bed: [id number] Expected date:  Expected time:  Means of arrival:  Comments: Held for 29

## 2015-12-03 NOTE — ED Notes (Signed)
Pt has 3 belongings bags (clothing, purse, and sliver/glitter bag).

## 2015-12-03 NOTE — ED Notes (Signed)
Pt presents w/ a note from psychiatry PA.  Per note, Pt needs to be evaluated for severe bipolar mixed.  Pt is currently prescribed: -Luvox 100mg  q AM -Abilify 10mg  q day -Anafranil 50mg  q hs -Xanax 1.0mg  1/2 tid PRN

## 2015-12-03 NOTE — ED Notes (Signed)
Ice water given per pt request

## 2015-12-03 NOTE — ED Triage Notes (Signed)
Pt presents for psych evaluation.  Pt was directed to ED by [name].  Sts she has had several medication changes recently.  Pt reports that she has been manic x 2-3 weeks.  Hx of bipolar disorder, OCD, and anorexia.  Denies SI/HI/AVH.

## 2015-12-03 NOTE — ED Provider Notes (Signed)
[facility] DEPT Provider Note   CSN: [id number] Arrival date & time: 12/03/15  1044     History   Chief Complaint Chief Complaint  Patient presents with  . Psychiatric Evaluation    HPI Jennifer [name] is a 50 y.o. female with a PMHx of bipolar disorder, OCD, binge-eating and purging type anorexia, and a PSHx of cholecystectomy, who presents to the ED sent in from [facility] for evaluation of her bipolar disorder. Patient reports she's had several medication changes recently and she feels that her anxiety is gradually worsening. She noticed that this started approximately 1-2 months ago, and has become much more significant over the last several weeks. Symptoms include anxiety, stating that she feels "like her skin is crawling" and has periods of screaming fits, as well as insomnia and difficulty sleeping. She denies SI/HI/AVH, denies alcohol or drug use, but admits to being a cigarette smoker. She is currently taking Anafranil 100 mg every morning, Abilify 10 mg every morning, Luvox 100 mg every morning all of which she took at 5:30 this morning; she also takes Xanax 0.5 mg TID PRN anxiety and Vistaril 25-50 mg every 6 hours as needed for anxiety which she last took yesterday afternoon. Here voluntarily requesting help with her psychiatric issues.    The history is provided by the patient and medical records. No language interpreter was used.  Mental Health Problem  Presenting symptoms: no hallucinations, no homicidal ideas and no suicidal thoughts   Onset quality:  Gradual Duration:  2 months Timing:  Constant Progression:  Worsening Chronicity:  Recurrent Context: recent medication change   Treatment compliance:  All of the time Time since last psychoactive medication taken:  6 hours Relieved by:  None tried Worsened by:  Nothing Ineffective treatments:  None tried Associated symptoms: anxiety   Associated symptoms: no abdominal pain and no chest pain   Risk  factors: hx of mental illness     Past Medical History:  Diagnosis Date  . Binge-eating and purging type anorexia nervosa   . Bipolar disorder ([place])   . Cystitis, interstitial   . OCD (obsessive compulsive disorder)     Patient Active Problem List   Diagnosis Date Noted  . Palpitations 05/25/2014  . Chest pain 05/25/2014  . Cellulitis and abscess of buttock 02/15/2013    Past Surgical History:  Procedure Laterality Date  . BLADDER SURGERY    . CHOLECYSTECTOMY    . VOCAL CORD LATERALIZATION, ENDOSCOPIC APPROACH W/ MLB      OB History    No data available       Home Medications    Prior to Admission medications   Medication Sig Start Date End Date Taking? Authorizing Provider  ABILIFY 20 MG tablet Take 10 mg by mouth every morning.  01/04/14  Yes Historical Provider, MD  ALPRAZolam Duanne Moron) 1 MG tablet Take 0.5 mg by mouth 3 (three) times daily as needed for anxiety. For anxiety 11/26/13  Yes Historical Provider, MD  clomiPRAMINE (ANAFRANIL) 50 MG capsule Take 100 mg by mouth every morning.    Yes Historical Provider, MD  fluvoxaMINE (LUVOX) 100 MG tablet Take 100 mg by mouth every morning. 11/22/15  Yes Historical Provider, MD  hydrOXYzine (VISTARIL) 25 MG capsule Take 25-50 mg by mouth every 6 (six) hours as needed for anxiety.  11/28/15  Yes Historical Provider, MD  naproxen sodium (ANAPROX) 220 MG tablet Take 440 mg by mouth every 12 (twelve) hours as needed (pain).   Yes Historical Provider,  MD  RESTASIS 0.05 % ophthalmic emulsion Place 1 drop into both eyes 2 (two) times daily.  07/24/14  Yes Historical Provider, MD  diltiazem (CARDIZEM) 30 MG tablet Take 1 tablet (30 mg total) by mouth 2 (two) times daily. Patient not taking: Reported on 12/03/2015 10/09/14   Arnoldo Lenis, MD    Family History History reviewed. No pertinent family history.  Social History Social History  Substance Use Topics  . Smoking status: Current Every Day Smoker    Packs/day: 0.50     Types: Cigarettes    Start date: 01/20/1988  . Smokeless tobacco: Never Used  . Alcohol use No     Allergies   Codeine; Meloxicam; Other; and Penicillins   Review of Systems Review of Systems  Constitutional: Negative for chills and fever.  Respiratory: Negative for shortness of breath.   Cardiovascular: Negative for chest pain.  Gastrointestinal: Negative for abdominal pain, constipation, diarrhea, nausea and vomiting.  Genitourinary: Negative for dysuria, hematuria, vaginal bleeding and vaginal discharge.  Musculoskeletal: Negative for arthralgias and myalgias.  Skin: Negative for color change.  Allergic/Immunologic: Negative for immunocompromised state.  Neurological: Negative for weakness and numbness.  Psychiatric/Behavioral: Positive for sleep disturbance. Negative for confusion, hallucinations, homicidal ideas and suicidal ideas. The patient is nervous/anxious.    10 Systems reviewed and are negative for acute change except as noted in the HPI.   Physical Exam Updated Vital Signs BP 126/81 (BP Location: Right Arm)   Pulse 83   Temp 98.4 F (36.9 C) (Oral)   Resp 16   LMP 10/07/2011   SpO2 100%   Physical Exam  Constitutional: She is oriented to person, place, and time. Vital signs are normal. She appears well-developed and well-nourished.  Non-toxic appearance. No distress.  Afebrile, nontoxic, NAD  HENT:  Head: Normocephalic and atraumatic.  Mouth/Throat: Oropharynx is clear and moist and mucous membranes are normal.  Eyes: Conjunctivae and EOM are normal. Right eye exhibits no discharge. Left eye exhibits no discharge.  Neck: Normal range of motion. Neck supple.  Cardiovascular: Normal rate, regular rhythm, normal heart sounds and intact distal pulses.  Exam reveals no gallop and no friction rub.   No murmur heard. Pulmonary/Chest: Effort normal and breath sounds normal. No respiratory distress. She has no decreased breath sounds. She has no wheezes. She has no  rhonchi. She has no rales.  Abdominal: Soft. Normal appearance and bowel sounds are normal. She exhibits no distension. There is no tenderness. There is no rigidity, no rebound, no guarding, no CVA tenderness, no tenderness at McBurney's point and negative Murphy's sign.  Musculoskeletal: Normal range of motion.  Neurological: She is alert and oriented to person, place, and time. She has normal strength. No sensory deficit.  Skin: Skin is warm, dry and intact. No rash noted.  Psychiatric: Her mood appears anxious. Her speech is rapid and/or pressured. She is not actively hallucinating. She expresses no homicidal and no suicidal ideation. She expresses no suicidal plans and no homicidal plans.  Rapid and pressured speech, very anxious appearing. Denies SI/HI/AVH  Nursing note and vitals reviewed.    ED Treatments / Results  Labs (all labs ordered are listed, but only abnormal results are displayed) Labs Reviewed  COMPREHENSIVE METABOLIC PANEL - Abnormal; Notable for the following:       Result Value   Glucose, Bld 119 (*)    AST 48 (*)    ALT 76 (*)    All other components within normal limits  RAPID URINE  DRUG SCREEN, HOSP PERFORMED - Abnormal; Notable for the following:    Benzodiazepines POSITIVE (*)    All other components within normal limits  ACETAMINOPHEN LEVEL - Abnormal; Notable for the following:    Acetaminophen (Tylenol), Serum <10 (*)    All other components within normal limits  CBC WITH DIFFERENTIAL/PLATELET  ETHANOL  SALICYLATE LEVEL  HCG, QUANTITATIVE, PREGNANCY    EKG  EKG Interpretation None       Radiology No results found.  Procedures Procedures (including critical care time)  Medications Ordered in ED Medications  ARIPiprazole (ABILIFY) tablet 10 mg (not administered)  ALPRAZolam (XANAX) tablet 0.5 mg (0.5 mg Oral Given 12/03/15 1258)  clomiPRAMINE (ANAFRANIL) capsule 100 mg (not administered)  fluvoxaMINE (LUVOX) tablet 100 mg (not  administered)  naproxen sodium (ANAPROX) tablet 412.5 mg (not administered)  cycloSPORINE (RESTASIS) 0.05 % ophthalmic emulsion 1 drop (1 drop Both Eyes Not Given 12/03/15 1241)  hydrOXYzine (ATARAX/VISTARIL) tablet 25-50 mg (not administered)  alum & mag hydroxide-simeth (MAALOX/MYLANTA) 200-200-20 MG/5ML suspension 30 mL (not administered)  ondansetron (ZOFRAN) tablet 4 mg (not administered)  nicotine (NICODERM CQ - dosed in mg/24 hours) patch 21 mg (not administered)  zolpidem (AMBIEN) tablet 5 mg (not administered)  acetaminophen (TYLENOL) tablet 650 mg (not administered)     Initial Impression / Assessment and Plan / ED Course  I have reviewed the triage vital signs and the nursing notes.  Pertinent labs & imaging results that were available during my care of the patient were reviewed by me and considered in my medical decision making (see chart for details).  Clinical Course     50 y.o. female here for bipolar disorder issues, [facility]. Has been having more anxiousness and manic behaviors over the last 1-2 months, several med changes have been tried and she feels it's getting worse, [facility] provider sent her here for ongoing care/management. Denies SI/HI/AVH. Denies other complaints. Somewhat rapid and pressured speech but very cooperative. Will get clearance labs and then TTS consult to help with this patient's care.   1:36 PM EtOH neg. CBC w/diff WNL. CMP with mildly elevated LFTs but otherwise neg. Salicylate and acetaminophen levels WNL. HCG neg. UDS with +benzos otherwise unremarkable. Pt medically cleared at this time. Psych hold orders and home med orders placed. Please see TTS notes for further documentation of care/dispo. Pt stable at time of med clearance.    Final Clinical Impressions(s) / ED Diagnoses   Final diagnoses:  Manic behavior ([place])  Bipolar affective disorder, remission status unspecified ([place])  Tobacco use  Anxiety  Elevated LFTs    New  Prescriptions New Prescriptions   No medications on file     Zacarias Pontes, PA-C 12/03/15 [place], MD 12/04/15 2039

## 2015-12-03 NOTE — ED Notes (Signed)
Patient transferred from [place].  She is extremely anxious, hyper-verbal and restless.  She had multiple questions about what was going to happen.  She stated she has been seeing the same PA for a long time and they have made multiple medication changes.  Husband is present for part of the interview and states that he is really worried about her being on Xanax, states she stumbles around the house, slurs her speech and has fallen.  Patient states the Xanax makes her sleep so hard that she often urinates in her sleep.  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  She does not feel she is able to go back to work at this point.

## 2015-12-03 NOTE — BH Assessment (Signed)
[facility] Assessment Progress Note  Per Waylan Boga, DNP, this pt requires psychiatric hospitalization at this time.  The following facilities have been contacted to seek placement for this pt, with results as noted:  Beds available, information sent, decision pending:  [facility]   At capacity:  [facility]   Jalene Mullet, [place] Triage Specialist [phone]

## 2015-12-03 NOTE — ED Notes (Signed)
Per request, Pt given ice water, crackers, and cheese.

## 2015-12-03 NOTE — BH Assessment (Signed)
Assessment Note  Jennifer [name] is an 50 y.o. female with history of Bipolar Disorder, OCD, and eating disorder (purge/anorexia). Patient presents to [facility] referred by outpatient psychiatric provider [facility]). Patient is under the care of psychiatrist, Dr. Lynder Parents. Patient brought to [facility] voluntarily. She was driven to [facility] by her mother. Patient's sister was at bedside when this writer entered the room to complete the TTS assessment. Patient asked sister to leave.   Writer met with patient face to face. Sts that her medications prescribed by Dr. Lynder Parents are not working. Sts, "I feel like my skin in crawling". Sts 5-6 weeks ago she noticed a change in her "thought process". She was having difficult thinking and felt increasingly anxious. 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 Patient's medications were changed as well as dosage changes. She is unable to recall what medications were changed or dosages.   Patient denies SI. Sts that she has tried to harm herself in the past (early 20's). Trigger for past suicide attempts were related to a eating disorder. Sts that her eating disorder is not a constant but reoccurs when she becomes anxious. She purged last week 3-4 times. Patient denies self mutilating behaviors. Sts that she is depressed with symptoms such as loss of interest in usual pleasures, fatigue, and guilt. Appetite is fair. Patient sleeps 8 hours of sleep per night. No family history of mental health illness.   Patient denies HI. No history of assaultive or aggressive behaviors. No legal issues. No AVH's. No alcohol or drug use reported. She has family history of alcoholism (paternal side of family). Patient has a lot of family support (sister, spouse, mother, etc.).   Diagnosis: Bipolar Disorder, Manic, Severe; Depressive Disorder, Severe; OCD  Past Medical History:  Past Medical History:   Diagnosis Date  . Binge-eating and purging type anorexia nervosa   . Bipolar disorder ([place])   . Cystitis, interstitial   . OCD (obsessive compulsive disorder)     Past Surgical History:  Procedure Laterality Date  . BLADDER SURGERY    . CHOLECYSTECTOMY    . VOCAL CORD LATERALIZATION, ENDOSCOPIC APPROACH W/ MLB      Family History: History reviewed. No pertinent family history.  Social History:  reports that she has been smoking Cigarettes.  She started smoking about 27 years ago. She has been smoking about 0.50 packs per day. She has never used smokeless tobacco. She reports that she does not drink alcohol or use drugs.  Additional Social History:     CIWA: CIWA-Ar BP: 126/81 Pulse Rate: 83 COWS:    Allergies:  Allergies  Allergen Reactions  . Codeine Hives and Shortness Of Breath  . Meloxicam Other (See Comments)    Possible chest tightness - instructed by MD not to take  . Other Nausea Only    CODIENE-unable to enter under "Agent" for some reason  . Penicillins Hives, Swelling and Rash    Has patient had a PCN reaction causing immediate rash, facial/tongue/throat swelling, SOB or lightheadedness with hypotension: yes Has patient had a PCN reaction causing severe rash involving mucus membranes or skin necrosis: no Has patient had a PCN reaction that required hospitalization: no Has patient had a PCN reaction occurring within the last 10 years: no If all of the above answers are "NO", then may proceed with Cephalosporin use.;     Home Medications:  (Not in a hospital admission)  OB/GYN Status:  Patient's last menstrual period was 10/07/2011.  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) Is patient pregnant?: No Pregnancy Status:  No Living Arrangements: Other (Comment), Spouse/significant other Can pt return to current living arrangement?: Yes Admission Status: Voluntary Is patient capable of signing voluntary admission?: Yes Referral Source: Self/Family/Friend Insurance type:  ([place] )  Medical Screening Exam ([place]) Medical Exam completed:  (n/a)  Crisis Care Plan Living Arrangements: Other (Comment), Spouse/significant other Legal Guardian: Other: (no legal guardian ) Name of Psychiatrist:  ([name]; Clarene Critchley Hurst/Carey Cottle) Name of Therapist:  [name] )  Education Status Is patient currently in school?: No Current Grade:  (n/a) Highest grade of school patient has completed:  (BS in elementary education; 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 : No Suicidal Intent: No Has patient had any suicidal intent within the past 6 months prior to admission? : No Is patient at risk for suicide?: No Suicidal Plan?: No Has patient had any suicidal plan within the past 6 months prior to admission? : No Access to Means: No Previous Attempts/Gestures: Yes How many times?:  ("68 or 50 yrs old ...when I was anorexic") Other Self Harm Risks:  (denies ) Triggers for Past Attempts: Other (Comment) (eating disorder) Intentional Self Injurious Behavior: None Family Suicide History: No Recent stressful life event(s): Other (Comment), Financial Problems ("My job.Marland KitchenMarland KitchenI was taken out of work due to my mental illness") Persecutory voices/beliefs?: No Depression: Yes Depression Symptoms: Feeling angry/irritable, Feeling worthless/self pity, Loss of interest in usual pleasures, Guilt, Fatigue, Tearfulness, Isolating, Insomnia Substance abuse history and/or treatment for substance abuse?: No Suicide prevention  information given to non-admitted patients: Not applicable  Risk to Others within the past 6 months Homicidal Ideation: No Does patient have any lifetime risk of violence toward others beyond the six months prior to admission? : No Thoughts of Harm to Others: No Current Homicidal Intent: No Current Homicidal Plan: No Access to Homicidal Means: No Identified Victim:  (n/a) History of harm to others?: No Assessment of Violence: None Noted Violent Behavior Description:  (currently calm and cooperative ) Does patient have access to weapons?: No Criminal Charges Pending?: No Does patient have a court date: No Is patient on probation?: No  Psychosis Hallucinations: None noted Delusions: None noted  Mental Status Report Appearance/Hygiene: Disheveled Eye Contact: Good Motor Activity: Restlessness Speech: Rapid, Pressured, Logical/coherent Level of Consciousness: Restless Mood: Anxious Affect: Anxious Anxiety Level: Severe Thought Processes: Coherent, Relevant Judgement: Impaired Orientation: Person, Place, Time, Situation Obsessive Compulsive Thoughts/Behaviors: None  Cognitive Functioning Concentration: Decreased Memory: Recent Intact, Remote Intact IQ: Average Insight: Poor Impulse Control: Fair Appetite: Poor (hx of anorexia/purging; last week purged 3-4x's) Weight Loss:  ("I get hungry ..but my meds makes my appetite go up and down) Weight Gain:  (denies ) Sleep: No Change Total Hours of Sleep:  (8 hrs per night ) Vegetative Symptoms: None  ADLScreening [facility] Assessment Services) Patient's cognitive ability adequate to safely complete daily activities?: Yes Patient able to express need for assistance with ADLs?: Yes Independently performs ADLs?: Yes (appropriate for  developmental age)  Prior Inpatient Therapy Prior Inpatient Therapy: Yes Prior Therapy Dates:  ("yrs ago"..."I was in my 20's") Prior Therapy Facilty/Provider(s):  [facility] psychiatric floor (6th  floor)) Reason for Treatment:  (eating disorder, OCD, depression, med managment, anxiety )  Prior Outpatient Therapy Prior Outpatient Therapy: Yes Prior Therapy Dates:  (current) Prior Therapy Facilty/Provider(s):  [facility] Dr. Lynder Parents, Nada Libman; Carin Primrose) Reason for Treatment:  (Eye Movement Densenization Therapy with [name]) Does patient have an ACCT team?: No Does patient have Intensive In-House Services?  : No Does patient have [facility] services? : No Does patient have P4CC services?: No  ADL Screening (condition at time of admission) Patient's cognitive ability adequate to safely complete daily activities?: Yes Patient able to express need for assistance with ADLs?: Yes Independently performs ADLs?: Yes (appropriate for developmental age)             Advance Directives ([place]) Does patient have an advance directive?: No    Additional Information 1:1 In Past 12 Months?: No CIRT Risk: No Elopement Risk: No Does patient have medical clearance?: Yes     Disposition:  Disposition Initial Assessment Completed for this Encounter: Yes  On Site Evaluation by:   Reviewed with Physician:    Waldon Merl 12/03/2015 2:37 PM

## 2015-12-03 NOTE — ED Notes (Signed)
Gave report to psych ED

## 2015-12-03 NOTE — ED Notes (Signed)
Patient resting in bed. Pt pleasant and cooperative with care, and brightens on approach. Pt denies suicidal/homicidal ideations or plans to harm herself. Pt's husband in to visit and is noted to appear supportive and attentive to patient. Pt states "That Saphris medication really helped me to feel calm without knocking me out. I like that better than the Xanax". Pt denies needs currently.

## 2015-12-04 ENCOUNTER — Encounter (HOSPITAL_COMMUNITY): Payer: Self-pay | Admitting: *Deleted

## 2015-12-04 ENCOUNTER — Inpatient Hospital Stay (HOSPITAL_COMMUNITY)
Admission: AD | Admit: 2015-12-04 | Discharge: 2015-12-08 | DRG: 885 | Disposition: A | Discharge status: Home / Self Care | Payer: BC Managed Care – PPO | Source: Intra-hospital | Attending: Psychiatry | Admitting: Psychiatry

## 2015-12-04 DIAGNOSIS — F319 Bipolar disorder, unspecified: Secondary | ICD-10-CM | POA: Diagnosis present

## 2015-12-04 DIAGNOSIS — F17210 Nicotine dependence, cigarettes, uncomplicated: Secondary | ICD-10-CM | POA: Diagnosis present

## 2015-12-04 DIAGNOSIS — F3110 Bipolar disorder, current episode manic without psychotic features, unspecified: Secondary | ICD-10-CM

## 2015-12-04 DIAGNOSIS — F429 Obsessive-compulsive disorder, unspecified: Secondary | ICD-10-CM | POA: Diagnosis present

## 2015-12-04 DIAGNOSIS — Z9889 Other specified postprocedural states: Secondary | ICD-10-CM

## 2015-12-04 DIAGNOSIS — Z79899 Other long term (current) drug therapy: Secondary | ICD-10-CM | POA: Diagnosis not present

## 2015-12-04 DIAGNOSIS — Z888 Allergy status to other drugs, medicaments and biological substances status: Secondary | ICD-10-CM | POA: Diagnosis not present

## 2015-12-04 DIAGNOSIS — F3160 Bipolar disorder, current episode mixed, unspecified: Secondary | ICD-10-CM | POA: Diagnosis present

## 2015-12-04 DIAGNOSIS — Z880 Allergy status to penicillin: Secondary | ICD-10-CM | POA: Diagnosis not present

## 2015-12-04 DIAGNOSIS — F3112 Bipolar disorder, current episode manic without psychotic features, moderate: Secondary | ICD-10-CM | POA: Diagnosis not present

## 2015-12-04 HISTORY — DX: Bipolar disorder, current episode manic without psychotic features, unspecified: F31.10

## 2015-12-04 HISTORY — DX: Bipolar disorder, unspecified: F31.9

## 2015-12-04 MED ORDER — MAGNESIUM HYDROXIDE 400 MG/5ML PO SUSP
30.0000 mL | Freq: Every day | ORAL | Status: DC | PRN
  Administered 2015-12-06: 30 mL via ORAL
  Filled 2015-12-04: qty 30

## 2015-12-04 MED ORDER — ASENAPINE MALEATE 5 MG SL SUBL: 5.0000 mg | SUBLINGUAL_TABLET | Freq: Every day | SUBLINGUAL | Status: DC

## 2015-12-04 MED ORDER — LORAZEPAM 1 MG PO TABS
1.0000 mg | ORAL_TABLET | Freq: Four times a day (QID) | ORAL | Status: DC | PRN
  Administered 2015-12-04: 1 mg via ORAL
  Filled 2015-12-04: qty 1

## 2015-12-04 MED ORDER — NICOTINE 21 MG/24HR TD PT24
21.0000 mg | MEDICATED_PATCH | Freq: Every day | TRANSDERMAL | Status: DC
  Administered 2015-12-05: 21 mg via TRANSDERMAL
  Filled 2015-12-04 (×6): qty 1

## 2015-12-04 MED ORDER — ASENAPINE MALEATE 5 MG SL SUBL
5.0000 mg | SUBLINGUAL_TABLET | Freq: Two times a day (BID) | SUBLINGUAL | Status: DC
  Administered 2015-12-04 – 2015-12-06 (×4): 5 mg via SUBLINGUAL
  Filled 2015-12-04 (×7): qty 1

## 2015-12-04 MED ORDER — HYDROXYZINE HCL 10 MG PO TABS
10.0000 mg | ORAL_TABLET | Freq: Three times a day (TID) | ORAL | Status: DC | PRN
  Administered 2015-12-04: 10 mg via ORAL
  Filled 2015-12-04: qty 1

## 2015-12-04 MED ORDER — CYCLOSPORINE 0.05 % OP EMUL
1.0000 [drp] | Freq: Two times a day (BID) | OPHTHALMIC | Status: DC
  Administered 2015-12-05 – 2015-12-08 (×7): 1 [drp] via OPHTHALMIC
  Filled 2015-12-04 (×13): qty 1

## 2015-12-04 MED ORDER — ALUM & MAG HYDROXIDE-SIMETH 200-200-20 MG/5ML PO SUSP: 30.0000 mL | ORAL | Status: DC | PRN

## 2015-12-04 MED ORDER — FLUVOXAMINE MALEATE 100 MG PO TABS
100.0000 mg | ORAL_TABLET | Freq: Every day | ORAL | Status: DC
  Administered 2015-12-05: 100 mg via ORAL
  Filled 2015-12-04 (×2): qty 1

## 2015-12-04 MED ORDER — HYDROXYZINE HCL 10 MG PO TABS
10.0000 mg | ORAL_TABLET | Freq: Three times a day (TID) | ORAL | Status: DC | PRN
  Administered 2015-12-05: 10 mg via ORAL
  Filled 2015-12-04: qty 1

## 2015-12-04 MED ORDER — ONDANSETRON HCL 4 MG PO TABS: 4.0000 mg | ORAL_TABLET | Freq: Three times a day (TID) | ORAL | Status: DC | PRN

## 2015-12-04 MED ORDER — ASENAPINE MALEATE 5 MG SL SUBL: 5.0000 mg | SUBLINGUAL_TABLET | Freq: Two times a day (BID) | SUBLINGUAL | Status: DC

## 2015-12-04 MED ORDER — CLOMIPRAMINE HCL 25 MG PO CAPS: 100.0000 mg | ORAL_CAPSULE | Freq: Every day | ORAL | Status: DC

## 2015-12-04 MED ORDER — ASENAPINE MALEATE 5 MG SL SUBL
5.0000 mg | SUBLINGUAL_TABLET | Freq: Two times a day (BID) | SUBLINGUAL | Status: DC
  Administered 2015-12-04: 5 mg via SUBLINGUAL
  Filled 2015-12-04: qty 1

## 2015-12-04 MED ORDER — ACETAMINOPHEN 325 MG PO TABS: 650.0000 mg | ORAL_TABLET | ORAL | Status: DC | PRN

## 2015-12-04 MED ORDER — CLOMIPRAMINE HCL 25 MG PO CAPS
100.0000 mg | ORAL_CAPSULE | Freq: Every day | ORAL | Status: DC
  Filled 2015-12-04 (×3): qty 4

## 2015-12-04 NOTE — ED Notes (Signed)
Pt compliant with medication regimen. Pt behavior labile.  Pt anxious, rapid speech, tearful at times. Pt denies SI/HI. Special checks q 15 mins in place for safety. Video monitoring in place.

## 2015-12-04 NOTE — Progress Notes (Signed)
12/04/15 1309:  Pt was on the phone when LRT was making rounds, declined activities.  Victorino Sparrow, LRT/CTRS

## 2015-12-04 NOTE — BH Assessment (Signed)
Per Dr. Darleene Cleaver and Waylan Boga, DNP, patient meets criteria for INPT treatment. Patient accepted to [facility] [id number]. Support paperwork completed. Nursing report [phone]. Pending [place] transport to [facility].

## 2015-12-04 NOTE — ED Notes (Signed)
Nursing report given to Jinny Sanders, RN at [facility] Unit - reports will call this nurse when unit is ready for pt.

## 2015-12-04 NOTE — Consult Note (Signed)
[place] [facility] Consult   Reason for Consult:  Anxiety, obsessive thoughts, lack of sleep Referring Physician:  EDP Patient Identification: Jennifer [name] MRN:  [id number] Principal Diagnosis: Bipolar affective disorder [facility]) Diagnosis:   Patient Active Problem List   Diagnosis Date Noted  . OCD (obsessive compulsive disorder) [F42.9] 12/04/2015    Priority: High  . Bipolar affective disorder ([place]) [F31.9] 12/04/2015    Priority: High  . Palpitations [R00.2] 05/25/2014  . Chest pain [R07.9] 05/25/2014  . Cellulitis and abscess of buttock [L02.31, L03.317] 02/15/2013    Total Time spent with patient: 45 minutes  Subjective:   Jennifer [name] is a 50 y.o. female patient admitted with worsening anxiety.  HPI: Jennifer [name] is an 50 y.o. female who reports history of mental illness dating back to age 66. Patient currently receives care at [facility] for  Bipolar Disorder, OCD, and Bulimia. Patient presents to [facility] referred by outpatient psychiatric provider [name], PA-C. Patient reports that she has become more anxious, apprehensive, worried with associated racing thoughts, difficulty sleeping and irritability. She reports that her medications has been changed multiple times in the last few months and as a result, "I feel like my skin is crawling and my thought process is kind of disorganized".  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 Patient denies delusions, psychosis, SI/HI, drug or alcohol abuse.  Past Psychiatric History: as above  Risk to Self: Suicidal Ideation: No Suicidal Intent: No Is patient at risk for suicide?: No Suicidal Plan?: No Access to Means: No How many times?:  ("44 or 50 yrs old ...when I was anorexic") Other Self Harm Risks:  (denies ) Triggers for Past Attempts: Other (Comment) (eating disorder) Intentional Self Injurious Behavior: None Risk to Others:  Homicidal Ideation: No Thoughts of Harm to Others: No Current Homicidal Intent: No Current Homicidal Plan: No Access to Homicidal Means: No Identified Victim:  (n/a) History of harm to others?: No Assessment of Violence: None Noted Violent Behavior Description:  (currently calm and cooperative ) Does patient have access to weapons?: No Criminal Charges Pending?: No Does patient have a court date: No Prior Inpatient Therapy: Prior Inpatient Therapy: Yes Prior Therapy Dates:  ("yrs ago"..."I was in my 20's") Prior Therapy Facilty/Provider(s):  [facility] psychiatric floor (6th floor)) Reason for Treatment:  (eating disorder, OCD, depression, med managment, anxiety ) Prior Outpatient Therapy: Prior Outpatient Therapy: Yes Prior Therapy Dates:  (current) Prior Therapy Facilty/Provider(s):  [facility] Dr. Lynder Parents, Nada Libman; Carin Primrose) Reason for Treatment:  (Eye Movement Densenization Therapy with [name]) Does patient have an ACCT team?: No Does patient have Intensive In-House Services?  : No Does patient have [facility] services? : No Does patient have P4CC services?: No  Past Medical History:  Past Medical History:  Diagnosis Date  . Binge-eating and purging type anorexia nervosa   . Bipolar disorder ([place])   . Cystitis, interstitial   . OCD (obsessive compulsive disorder)     Past Surgical History:  Procedure Laterality Date  . BLADDER SURGERY    . CHOLECYSTECTOMY    . VOCAL CORD LATERALIZATION, ENDOSCOPIC APPROACH W/ MLB     Family History: History reviewed. No pertinent family history. Family Psychiatric  History: Alcoholism and Anorexia Social History:  History  Alcohol Use No     History  Drug Use No    Social History   Social History  .  Marital status: Married    Spouse name: N/A  . Number of children: N/A  . Years of education: N/A   Social History Main Topics  . Smoking status: Current Every Day Smoker    Packs/day: 0.50     Types: Cigarettes    Start date: 01/20/1988  . Smokeless tobacco: Never Used  . Alcohol use No  . Drug use: No  . Sexual activity: Yes    Birth control/ protection: None   Other Topics Concern  . None   Social History Narrative  . None   Additional Social History:    Allergies:   Allergies  Allergen Reactions  . Codeine Hives and Shortness Of Breath  . Meloxicam Other (See Comments)    Possible chest tightness - instructed by MD not to take  . Other Nausea Only    CODIENE-unable to enter under "Agent" for some reason  . Penicillins Hives, Swelling and Rash    Has patient had a PCN reaction causing immediate rash, facial/tongue/throat swelling, SOB or lightheadedness with hypotension: yes Has patient had a PCN reaction causing severe rash involving mucus membranes or skin necrosis: no Has patient had a PCN reaction that required hospitalization: no Has patient had a PCN reaction occurring within the last 10 years: no If all of the above answers are "NO", then may proceed with Cephalosporin use.;     Labs:  Results for orders placed or performed during the hospital encounter of 12/03/15 (from the past 48 hour(s))  CBC with Differential/Platelet     Status: None   Collection Time: 12/03/15 12:00 PM  Result Value Ref Range   WBC 7.2 4.0 - 10.5 K/uL   RBC 4.39 3.87 - 5.11 MIL/uL   Hemoglobin 13.3 12.0 - 15.0 g/dL   HCT 39.0 36.0 - 46.0 %   MCV 88.8 78.0 - 100.0 fL   MCH 30.3 26.0 - 34.0 pg   MCHC 34.1 30.0 - 36.0 g/dL   RDW 13.3 11.5 - 15.5 %   Platelets 163 150 - 400 K/uL   Neutrophils Relative % 59 %   Neutro Abs 4.3 1.7 - 7.7 K/uL   Lymphocytes Relative 30 %   Lymphs Abs 2.1 0.7 - 4.0 K/uL   Monocytes Relative 8 %   Monocytes Absolute 0.6 0.1 - 1.0 K/uL   Eosinophils Relative 3 %   Eosinophils Absolute 0.2 0.0 - 0.7 K/uL   Basophils Relative 0 %   Basophils Absolute 0.0 0.0 - 0.1 K/uL  Comprehensive metabolic panel     Status: Abnormal   Collection Time:  12/03/15 12:00 PM  Result Value Ref Range   Sodium 138 135 - 145 mmol/L   Potassium 3.9 3.5 - 5.1 mmol/L   Chloride 103 101 - 111 mmol/L   CO2 24 22 - 32 mmol/L   Glucose, Bld 119 (H) 65 - 99 mg/dL   BUN 14 6 - 20 mg/dL   Creatinine, Ser 0.67 0.44 - 1.00 mg/dL   Calcium 9.5 8.9 - 10.3 mg/dL   Total Protein 7.0 6.5 - 8.1 g/dL   Albumin 4.4 3.5 - 5.0 g/dL   AST 48 (H) 15 - 41 U/L   ALT 76 (H) 14 - 54 U/L   Alkaline Phosphatase 68 38 - 126 U/L   Total Bilirubin 0.5 0.3 - 1.2 mg/dL   GFR calc non Af Amer >60 >60 mL/min   GFR calc Af Amer >60 >60 mL/min    Comment: (NOTE) The eGFR has been calculated using  the CKD EPI equation. This calculation has not been validated in all clinical situations. eGFR's persistently <60 mL/min signify possible Chronic Kidney Disease.    Anion gap 11 5 - 15  Ethanol     Status: None   Collection Time: 12/03/15 12:00 PM  Result Value Ref Range   Alcohol, Ethyl (B) <5 <5 mg/dL    Comment:        LOWEST DETECTABLE LIMIT FOR SERUM ALCOHOL IS 5 mg/dL FOR MEDICAL PURPOSES ONLY   Salicylate level     Status: None   Collection Time: 12/03/15 12:00 PM  Result Value Ref Range   Salicylate Lvl <1.6 2.8 - 30.0 mg/dL  Acetaminophen level     Status: Abnormal   Collection Time: 12/03/15 12:00 PM  Result Value Ref Range   Acetaminophen (Tylenol), Serum <10 (L) 10 - 30 ug/mL    Comment:        THERAPEUTIC CONCENTRATIONS VARY SIGNIFICANTLY. A RANGE OF 10-30 ug/mL MAY BE AN EFFECTIVE CONCENTRATION FOR MANY PATIENTS. HOWEVER, SOME ARE BEST TREATED AT CONCENTRATIONS OUTSIDE THIS RANGE. ACETAMINOPHEN CONCENTRATIONS >150 ug/mL AT 4 HOURS AFTER INGESTION AND >50 ug/mL AT 12 HOURS AFTER INGESTION ARE OFTEN ASSOCIATED WITH TOXIC REACTIONS.   hCG, quantitative, pregnancy     Status: None   Collection Time: 12/03/15 12:00 PM  Result Value Ref Range   hCG, Beta Chain, Quant, S 1 <5 mIU/mL    Comment:          GEST. AGE      CONC.  (mIU/mL)   <=1 WEEK         5 - 50     2 WEEKS       50 - 500     3 WEEKS       100 - 10,000     4 WEEKS     1,000 - 30,000     5 WEEKS     3,500 - 115,000   6-8 WEEKS     12,000 - 270,000    12 WEEKS     15,000 - 220,000        FEMALE AND NON-PREGNANT FEMALE:     LESS THAN 5 mIU/mL   Rapid urine drug screen (hospital performed)     Status: Abnormal   Collection Time: 12/03/15 12:42 PM  Result Value Ref Range   Opiates NONE DETECTED NONE DETECTED   Cocaine NONE DETECTED NONE DETECTED   Benzodiazepines POSITIVE (A) NONE DETECTED   Amphetamines NONE DETECTED NONE DETECTED   Tetrahydrocannabinol NONE DETECTED NONE DETECTED   Barbiturates NONE DETECTED NONE DETECTED    Comment:        DRUG SCREEN FOR MEDICAL PURPOSES ONLY.  IF CONFIRMATION IS NEEDED FOR ANY PURPOSE, NOTIFY LAB WITHIN 5 DAYS.        LOWEST DETECTABLE LIMITS FOR URINE DRUG SCREEN Drug Class       Cutoff (ng/mL) Amphetamine      1000 Barbiturate      200 Benzodiazepine   109 Tricyclics       604 Opiates          300 Cocaine          300 THC              50     Current Facility-Administered Medications  Medication Dose Route Frequency Provider Last Rate Last Dose  . acetaminophen (TYLENOL) tablet 650 mg  650 mg Oral Q4H PRN Mercedes Camprubi-Soms, PA-C      .  alum & mag hydroxide-simeth (MAALOX/MYLANTA) 200-200-20 MG/5ML suspension 30 mL  30 mL Oral PRN Mercedes Camprubi-Soms, PA-C      . asenapine (SAPHRIS) sublingual tablet 5 mg  5 mg Sublingual BID Mckinzi Eriksen, MD      . clomiPRAMINE (ANAFRANIL) capsule 100 mg  100 mg Oral QHS Timia Casselman, MD      . cycloSPORINE (RESTASIS) 0.05 % ophthalmic emulsion 1 drop  1 drop Both Eyes BID Mercedes Camprubi-Soms, PA-C   1 drop at 12/04/15 0904  . fluvoxaMINE (LUVOX) tablet 100 mg  100 mg Oral Daily Mercedes Camprubi-Soms, PA-C   100 mg at 12/04/15 0903  . hydrOXYzine (ATARAX/VISTARIL) tablet 10 mg  10 mg Oral TID PRN Corena Pilgrim, MD      . LORazepam (ATIVAN) tablet 1 mg  1 mg Oral Q6H  PRN [name], NP   1 mg at 12/04/15 1017  . naproxen sodium (ANAPROX) tablet 412.5 mg  412.5 mg Oral Q12H PRN Mercedes Camprubi-Soms, PA-C      . nicotine (NICODERM CQ - dosed in mg/24 hours) patch 21 mg  21 mg Transdermal Daily Mercedes Camprubi-Soms, PA-C      . ondansetron (ZOFRAN) tablet 4 mg  4 mg Oral Q8H PRN Mercedes Camprubi-Soms, PA-C      . zolpidem (AMBIEN) tablet 5 mg  5 mg Oral QHS PRN Mercedes Camprubi-Soms, PA-C       Current Outpatient Prescriptions  Medication Sig Dispense Refill  . ABILIFY 20 MG tablet Take 10 mg by mouth every morning.     Marland Kitchen ALPRAZolam (XANAX) 1 MG tablet Take 0.5 mg by mouth 3 (three) times daily as needed for anxiety. For anxiety    . clomiPRAMINE (ANAFRANIL) 50 MG capsule Take 100 mg by mouth every morning.     . fluvoxaMINE (LUVOX) 100 MG tablet Take 100 mg by mouth every morning.    . hydrOXYzine (VISTARIL) 25 MG capsule Take 25-50 mg by mouth every 6 (six) hours as needed for anxiety.     . naproxen sodium (ANAPROX) 220 MG tablet Take 440 mg by mouth every 12 (twelve) hours as needed (pain).    . RESTASIS 0.05 % ophthalmic emulsion Place 1 drop into both eyes 2 (two) times daily.     Marland Kitchen diltiazem (CARDIZEM) 30 MG tablet Take 1 tablet (30 mg total) by mouth 2 (two) times daily. (Patient not taking: Reported on 12/03/2015) 60 tablet 6    Musculoskeletal: Strength & Muscle Tone: within normal limits Gait & Station: normal Patient leans: N/A  Psychiatric Specialty Exam: Physical Exam  Psychiatric: Judgment and thought content normal. Her mood appears anxious. Her speech is rapid and/or pressured. She is agitated and hyperactive. Cognition and memory are normal. She exhibits a depressed mood.    Review of Systems  Constitutional: Negative.   HENT: Negative.   Eyes: Negative.   Respiratory: Negative.   Cardiovascular: Negative.   Gastrointestinal: Negative.   Genitourinary: Negative.   Musculoskeletal: Negative.   Skin: Negative.    Neurological: Negative.   Endo/Heme/Allergies: Negative.   Psychiatric/Behavioral: Positive for depression. The patient is nervous/anxious and has insomnia.     Blood pressure 115/68, pulse 80, temperature 98.5 F (36.9 C), resp. rate 18, last menstrual period 10/07/2011, SpO2 100 %.There is no height or weight on file to calculate BMI.  General Appearance: Casual  Eye Contact:  Good  Speech:  Pressured  Volume:  Increased  Mood:  Anxious and Irritable  Affect:  Tearful  Thought Process:  Disorganized  Orientation:  Full (Time, Place, and Person)  Thought Content:  Logical  Suicidal Thoughts:  No  Homicidal Thoughts:  No  Memory:  Immediate;   Good Recent;   Good Remote;   Good  Judgement:  Intact  Insight:  Fair  Psychomotor Activity:  Increased and Restlessness  Concentration:  Concentration: Poor and Attention Span: Poor  Recall:  Good  Fund of Knowledge:  Good  Language:  Good  Akathisia:  No  Handed:  Right  AIMS (if indicated):     Assets:  Communication Skills Desire for Improvement Physical Health Social Support  ADL's:  Intact  Cognition:  WNL  Sleep:   poor     Treatment Plan Summary: Daily contact with patient to assess and evaluate symptoms and progress in treatment, Medication management. Discontinue Abilify-increased restlessness Start Saphris 78m S/L bid for mood Continue Luvox 1018mdaily for OCD Continue Anafranil 10027mhs for OCD Start Hydroxyzine 10 mg tid prn anxiety  Disposition: Recommend psychiatric Inpatient admission when medically cleared. Supportive therapy provided about ongoing stressors.  AkiCorena PilgrimD 12/04/2015 11:19 AM

## 2015-12-04 NOTE — Progress Notes (Signed)
Admission Note:  50 year female who presents voluntary, in no acute distress, for the treatment of Anxiety and Depression. Patient appears anxious with rapid, pressured speech. Patient was cooperative with admission process. Patient currently denies SI and contracts for safety upon admission. Patient denies current AVH.  Patient reports hx of AVH due to medication "5 weeks ago". Patient has since been taken off of that medication and has had no more episodes of AVH.  Patient reports frequent crying spells due to uncontrolled anxiety.  Patient reports OCD is getting out of control and verbalizes paranoia in regards to daily events such as worrying if her husband will make it home from work.  Patient reports paranoia is not normal behavior for her.  Patient identifies multiple stressors such as occupational concerns, financial issues, family conflict, and health issues.  Patient lives with her husband and identifies her husband and sister as her support systems. Patient has plans to return to her private residence with husband on discharge.  While at [facility], patient would like to "Get the anxiety under control. More stable" and "not be so worried and fear about arranging things in certain ways".  Skin was assessed and found to be clear of any abnormal marks apart from a superficial scratch to left arm. Patient searched and no contraband found, POC and unit policies explained and understanding verbalized. Consents obtained. Food and fluids offered and fluids accepted. Patient had no additional questions or concerns.

## 2015-12-04 NOTE — Tx Team (Signed)
Initial Treatment Plan 12/04/2015 6:07 PM [name] [id number]    PATIENT STRESSORS: Financial difficulties Health problems Marital or family conflict Occupational concerns   PATIENT STRENGTHS: Ability for insight Average or above average intelligence Communication skills Motivation for treatment/growth Supportive family/friends   PATIENT IDENTIFIED PROBLEMS: Depression  Anxiety  "Get the anxiety under control. More stable condition"  "Not be so worried and fear about arranging things in certain ways"               DISCHARGE CRITERIA:  Ability to meet basic life and health needs Improved stabilization in mood, thinking, and/or behavior Motivation to continue treatment in a less acute level of care Need for constant or close observation no longer present  PRELIMINARY DISCHARGE PLAN: Outpatient therapy Return to previous living arrangement Return to previous work or school arrangements  PATIENT/FAMILY INVOLVEMENT: This treatment plan has been presented to and reviewed with the patient, [name].  The patient and family have been given the opportunity to ask questions and make suggestions.  Dustin Flock, RN 12/04/2015, 6:07 PM

## 2015-12-04 NOTE — ED Notes (Signed)
[facility] on unit to transfer pt to [facility] Adult unit per MD order. Pt signed for personal property and property given to [facility] for transfer. Pt signed e-signature. Ambulatory off unit. 

## 2015-12-04 NOTE — ED Notes (Signed)
Received call from [facility], reports they are ready for pt at this time.

## 2015-12-04 NOTE — Progress Notes (Signed)
Adult Psychoeducational Group Note  Date:  12/04/2015 Time:  11:42 PM  Group Topic/Focus:  Wrap-Up Group:   The focus of this group is to help patients review their daily goal of treatment and discuss progress on daily workbooks.   Participation Level:  Active  Participation Quality:  Appropriate  Affect:  Appropriate  Cognitive:  Alert, Appropriate and Oriented  Insight: Appropriate  Engagement in Group:  Engaged  Modes of Intervention:  Discussion  Patient attended wrap-up group and did not wish to participated  Jennifer [name] W Jennifer [name] 0000000, 11:42 PM

## 2015-12-05 DIAGNOSIS — Z880 Allergy status to penicillin: Secondary | ICD-10-CM

## 2015-12-05 DIAGNOSIS — Z888 Allergy status to other drugs, medicaments and biological substances status: Secondary | ICD-10-CM

## 2015-12-05 DIAGNOSIS — F3160 Bipolar disorder, current episode mixed, unspecified: Principal | ICD-10-CM

## 2015-12-05 DIAGNOSIS — Z79899 Other long term (current) drug therapy: Secondary | ICD-10-CM

## 2015-12-05 MED ORDER — LORAZEPAM 1 MG PO TABS
1.0000 mg | ORAL_TABLET | Freq: Four times a day (QID) | ORAL | Status: AC | PRN
  Administered 2015-12-06: 1 mg via ORAL
  Filled 2015-12-05: qty 1

## 2015-12-05 MED ORDER — LORAZEPAM 1 MG PO TABS
1.0000 mg | ORAL_TABLET | Freq: Three times a day (TID) | ORAL | Status: AC
  Administered 2015-12-06 – 2015-12-07 (×3): 1 mg via ORAL
  Filled 2015-12-05 (×2): qty 1

## 2015-12-05 MED ORDER — THIAMINE HCL 100 MG/ML IJ SOLN: 100.0000 mg | Freq: Once | INTRAMUSCULAR | Status: DC

## 2015-12-05 MED ORDER — CLOMIPRAMINE HCL 25 MG PO CAPS
25.0000 mg | ORAL_CAPSULE | Freq: Every day | ORAL | Status: DC
  Filled 2015-12-05: qty 1

## 2015-12-05 MED ORDER — ADULT MULTIVITAMIN W/MINERALS CH
1.0000 | ORAL_TABLET | Freq: Every day | ORAL | Status: DC
  Administered 2015-12-05 – 2015-12-08 (×4): 1 via ORAL
  Filled 2015-12-05 (×7): qty 1

## 2015-12-05 MED ORDER — FLUVOXAMINE MALEATE 50 MG PO TABS
50.0000 mg | ORAL_TABLET | Freq: Every day | ORAL | Status: DC
  Administered 2015-12-06 – 2015-12-08 (×3): 50 mg via ORAL
  Filled 2015-12-05 (×5): qty 1

## 2015-12-05 MED ORDER — LORAZEPAM 1 MG PO TABS
1.0000 mg | ORAL_TABLET | Freq: Two times a day (BID) | ORAL | Status: AC
  Administered 2015-12-07 – 2015-12-08 (×2): 1 mg via ORAL
  Filled 2015-12-05 (×3): qty 1

## 2015-12-05 MED ORDER — LOPERAMIDE HCL 2 MG PO CAPS: 2.0000 mg | ORAL_CAPSULE | ORAL | Status: AC | PRN

## 2015-12-05 MED ORDER — LORAZEPAM 1 MG PO TABS
1.0000 mg | ORAL_TABLET | Freq: Once | ORAL | Status: AC
  Administered 2015-12-05: 1 mg via ORAL
  Filled 2015-12-05: qty 1

## 2015-12-05 MED ORDER — HYDROXYZINE HCL 25 MG PO TABS
25.0000 mg | ORAL_TABLET | Freq: Four times a day (QID) | ORAL | Status: AC | PRN
  Administered 2015-12-05 – 2015-12-06 (×2): 25 mg via ORAL
  Filled 2015-12-05 (×2): qty 1

## 2015-12-05 MED ORDER — LORAZEPAM 1 MG PO TABS
1.0000 mg | ORAL_TABLET | Freq: Four times a day (QID) | ORAL | Status: AC
  Administered 2015-12-05 – 2015-12-06 (×4): 1 mg via ORAL
  Filled 2015-12-05 (×4): qty 1

## 2015-12-05 MED ORDER — VITAMIN B-1 100 MG PO TABS
100.0000 mg | ORAL_TABLET | Freq: Every day | ORAL | Status: DC
  Administered 2015-12-06 – 2015-12-08 (×3): 100 mg via ORAL
  Filled 2015-12-05 (×5): qty 1

## 2015-12-05 MED ORDER — LORAZEPAM 1 MG PO TABS: 1.0000 mg | ORAL_TABLET | Freq: Every day | ORAL | Status: DC

## 2015-12-05 MED ORDER — VITAMIN B-1 100 MG PO TABS
100.0000 mg | ORAL_TABLET | Freq: Once | ORAL | Status: AC
  Administered 2015-12-05: 100 mg via ORAL
  Filled 2015-12-05 (×2): qty 1

## 2015-12-05 MED ORDER — ONDANSETRON 4 MG PO TBDP: 4.0000 mg | ORAL_TABLET | Freq: Four times a day (QID) | ORAL | Status: AC | PRN

## 2015-12-05 NOTE — Tx Team (Signed)
Interdisciplinary Treatment and Diagnostic Plan Update  12/05/2015 Time of Session: 12:06 PM  Jennifer [name] MRN: [id number]  Principal Diagnosis: Mixed bipolar I disorder ([place])  Secondary Diagnoses: Principal Problem:   Mixed bipolar I disorder (Jennifer [name]) Active Problems:   Bipolar affective disorder, current episode manic without psychotic symptoms (Jennifer [name])   Current Medications:  Current Facility-Administered Medications  Medication Dose Route Frequency Provider Last Rate Last Dose  . acetaminophen (TYLENOL) tablet 650 mg  650 mg Oral Q4H PRN [name], NP      . alum & mag hydroxide-simeth (MAALOX/MYLANTA) 200-200-20 MG/5ML suspension 30 mL  30 mL Oral PRN [name], NP      . asenapine (SAPHRIS) sublingual tablet 5 mg  5 mg Sublingual BID [name], NP   5 mg at 12/05/15 0754  . cycloSPORINE (RESTASIS) 0.05 % ophthalmic emulsion 1 drop  1 drop Both Eyes BID [name], NP   1 drop at 12/05/15 0755  . [START ON 12/06/2015] fluvoxaMINE (LUVOX) tablet 50 mg  50 mg Oral Daily [name], MD      . hydrOXYzine (ATARAX/VISTARIL) tablet 25 mg  25 mg Oral Q6H PRN [name], MD      . loperamide (IMODIUM) capsule 2-4 mg  2-4 mg Oral PRN [name], MD      . LORazepam (ATIVAN) tablet 1 mg  1 mg Oral Q6H PRN [name], MD      . LORazepam (ATIVAN) tablet 1 mg  1 mg Oral QID [name], MD   1 mg at 12/05/15 1143   Followed by  . [START ON 12/06/2015] LORazepam (ATIVAN) tablet 1 mg  1 mg Oral TID [name], MD       Followed by  . [START ON 12/07/2015] LORazepam (ATIVAN) tablet 1 mg  1 mg Oral BID [name], MD       Followed by  . [START ON 12/09/2015] LORazepam (ATIVAN) tablet 1 mg  1 mg Oral Daily [name], MD      . magnesium hydroxide (MILK OF MAGNESIA) suspension 30 mL  30 mL Oral Daily PRN [name], NP      . multivitamin with minerals tablet 1 tablet  1 tablet Oral Daily [name], MD   1 tablet at  12/05/15 1143  . nicotine (NICODERM CQ - dosed in mg/24 hours) patch 21 mg  21 mg Transdermal Daily [name], NP   21 mg at 12/05/15 0752  . ondansetron (ZOFRAN-ODT) disintegrating tablet 4 mg  4 mg Oral Q6H PRN [name], MD      . thiamine (B-1) injection 100 mg  100 mg Intramuscular Once [name], MD      . Derrill Memo ON 12/06/2015] thiamine (VITAMIN B-1) tablet 100 mg  100 mg Oral Daily [name], MD      . thiamine (VITAMIN B-1) tablet 100 mg  100 mg Oral Once [name], NP        PTA Medications: Prescriptions Prior to Admission  Medication Sig Dispense Refill Last Dose  . ABILIFY 20 MG tablet Take 10 mg by mouth every morning.    12/03/2015 at Unknown time  . ALPRAZolam (XANAX) 1 MG tablet Take 0.5 mg by mouth 3 (three) times daily as needed for anxiety. For anxiety   12/02/2015 at Unknown time  . clomiPRAMINE (ANAFRANIL) 50 MG capsule Take 100 mg by mouth every morning.  12/03/2015 at Unknown time  . diltiazem (CARDIZEM) 30 MG tablet Take 1 tablet (30 mg total) by mouth 2 (two) times daily. (Patient not taking: Reported on 12/03/2015) 60 tablet 6 Not Taking at Unknown time  . fluvoxaMINE (LUVOX) 100 MG tablet Take 100 mg by mouth every morning.   12/03/2015 at Unknown time  . hydrOXYzine (VISTARIL) 25 MG capsule Take 25-50 mg by mouth every 6 (six) hours as needed for anxiety.    12/02/2015 at 1400  . naproxen sodium (ANAPROX) 220 MG tablet Take 440 mg by mouth every 12 (twelve) hours as needed (pain).   Past Week at Unknown time  . RESTASIS 0.05 % ophthalmic emulsion Place 1 drop into both eyes 2 (two) times daily.    12/03/2015 at Unknown time    Treatment Modalities: Medication Management, Group therapy, Case management,  1 to 1 session with clinician, Psychoeducation, Recreational therapy.  Patient Stressors: Financial difficulties Health problems Marital or family conflict Occupational concerns  Patient Strengths: Ability for insight Average  or above average Architect for treatment/growth Supportive family/friends  Physician Treatment Plan for Primary Diagnosis: Mixed bipolar I disorder (Jennifer [name]) Long Term Goal(s): Improvement in symptoms so as ready for discharge  Short Term Goals: Ability to verbalize feelings will improve Ability to disclose and discuss suicidal ideas Ability to demonstrate self-control will improve Ability to identify and develop effective coping behaviors will improve Ability to verbalize feelings will improve Ability to disclose and discuss suicidal ideas Ability to demonstrate self-control will improve Ability to identify and develop effective coping behaviors will improve  Medication Management: Evaluate patient's response, side effects, and tolerance of medication regimen.  Therapeutic Interventions: 1 to 1 sessions, Unit Group sessions and Medication administration.  Evaluation of Outcomes: Not Met  Physician Treatment Plan for Secondary Diagnosis: Principal Problem:   Mixed bipolar I disorder (Jennifer [name]) Active Problems:   Bipolar affective disorder, current episode manic without psychotic symptoms (Jennifer [name])   Long Term Goal(s): Improvement in symptoms so as ready for discharge  Short Term Goals: Ability to verbalize feelings will improve Ability to disclose and discuss suicidal ideas Ability to demonstrate self-control will improve Ability to identify and develop effective coping behaviors will improve Ability to verbalize feelings will improve Ability to disclose and discuss suicidal ideas Ability to demonstrate self-control will improve Ability to identify and develop effective coping behaviors will improve  Medication Management: Evaluate patient's response, side effects, and tolerance of medication regimen.  Therapeutic Interventions: 1 to 1 sessions, Unit Group sessions and Medication administration.  Evaluation of Outcomes: Not Met   RN Treatment Plan  for Primary Diagnosis: Mixed bipolar I disorder (Jennifer [name]) Long Term Goal(s): Knowledge of disease and therapeutic regimen to maintain health will improve  Short Term Goals: Ability to participate in decision making will improve, Ability to verbalize feelings will improve and Ability to identify and develop effective coping behaviors will improve  Medication Management: RN will administer medications as ordered by provider, will assess and evaluate patient's response and provide education to patient for prescribed medication. RN will report any adverse and/or side effects to prescribing provider.  Therapeutic Interventions: 1 on 1 counseling sessions, Psychoeducation, Medication administration, Evaluate responses to treatment, Monitor vital signs and CBGs as ordered, Perform/monitor CIWA, COWS, AIMS and Fall Risk screenings as ordered, Perform wound care treatments as ordered.  Evaluation of Outcomes: Not Met   LCSW Treatment Plan for Primary Diagnosis: Mixed bipolar I disorder ([place]) Long Term Goal(s): Safe transition to appropriate next level  of care at discharge, Engage patient in therapeutic group addressing interpersonal concerns.  Short Term Goals: Engage patient in aftercare planning with referrals and resources, Increase emotional regulation, Facilitate acceptance of mental health diagnosis and concerns and Increase skills for wellness and recovery  Therapeutic Interventions: Assess for all discharge needs, 1 to 1 time with Social worker, Explore available resources and support systems, Assess for adequacy in community support network, Educate family and significant other(s) on suicide prevention, Complete Psychosocial Assessment, Interpersonal group therapy.  Evaluation of Outcomes: Not Met   Progress in Treatment: Attending groups: Pt is new to milieu, continuing to assess  Participating in groups: Pt is new to milieu, continuing to assess  Taking medication as prescribed: Yes, MD  continues to assess for medication changes as needed Toleration medication: Yes, no side effects reported at this time Family/Significant other contact made: Yes with husband Patient understands diagnosis: Continuing to assess Discussing patient identified problems/goals with staff: Yes Medical problems stabilized or resolved: Yes Denies suicidal/homicidal ideation: Yes Issues/concerns per patient self-inventory: None Other: N/A  New problem(s) identified: None identified at this time.   New Short Term/Long Term Goal(s): None identified at this time.   Discharge Plan or Barriers: Pt will return home and follow-up with outpatient services.   Reason for Continuation of Hospitalization: Anxiety Mania Medication stabilization Withdrawal symptoms  Estimated Length of Stay: 3-5 days  Attendees: Patient: 12/05/2015  12:06 PM  Physician: Dr. Parke Poisson 12/05/2015  12:06 PM  Nursing: Desma Paganini, RN 12/05/2015  12:06 PM  RN Care Manager: Lars Pinks, RN 12/05/2015  12:06 PM  Social Worker: Adriana Reams, LCSW; Erasmo Downer Drinkard, LCSW 12/05/2015  12:06 PM  Recreational Therapist:  12/05/2015  12:06 PM  Other: Catalina Pizza, NP; Samuel Jester, NP 12/05/2015  12:06 PM  Other:  12/05/2015  12:06 PM  Other: 12/05/2015  12:06 PM    Scribe for Treatment Team: Gladstone Lighter, LCSW 12/05/2015 12:06 PM

## 2015-12-05 NOTE — Progress Notes (Signed)
D: Pt denies SI/HI/AVH. Pt is pleasant and cooperative. Pt stated she felt a little better this evening.   A: Pt was offered support and encouragement. Pt was given scheduled medications. Pt was encourage to attend groups. Q 15 minute checks were done for safety.   R:Pt attends groups and interacts well with peers and staff. Pt is taking medication. Pt has no complaints.Pt receptive to treatment and safety maintained on unit.
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
## 2015-12-05 NOTE — [facility] Group Notes (Signed)
[facility] Group Notes:  (Nursing/MHT/Case Management/Adjunct)  Date:  12/05/2015  Time:  6:50 PM  Type of Therapy:  Psychoeducational Skills  Participation Level:  Did Not Attend  Participation Quality:  N/A  Affect:  N/A  Cognitive:  N/A  Insight:  None  Engagement in Group:  None  Modes of Intervention:  Activity, Discussion and Education  Summary of Progress/Problems: Patient was out of group due to meeting with counselor.  Gaylan Gerold E 12/05/2015, 6:50 PM

## 2015-12-05 NOTE — H&P (Addendum)
Psychiatric Admission Assessment Adult  Patient Identification: Jennifer [name] MRN:  [id number] Date of Evaluation:  12/05/2015 Chief Complaint:   " I feel like I am falling apart " Principal Diagnosis:  Bipolar Disorder, Mixed  Diagnosis:  Bipolar Disorder, Mixed  Patient Active Problem List   Diagnosis Date Noted  . OCD (obsessive compulsive disorder) [F42.9] 12/04/2015  . Bipolar affective disorder ([place]) [F31.9] 12/04/2015  . Bipolar affective disorder, current episode manic without psychotic symptoms ([place]) [F31.10] 12/04/2015  . Palpitations [R00.2] 05/25/2014  . Chest pain [R07.9] 05/25/2014  . Cellulitis and abscess of buttock [L02.31, L03.317] 02/15/2013   History of Present Illness: 50 year old female. She presented to ED due to worsening anxiety, a subjective feeling of " skin crawling", and difficulty functioning in her daily activities , including work, which she attributes to anxiety. She states her psychiatric medications " seem not be working anymore". She states " these last few months have been very difficult, I feel like I am falling apart". She denies any suicidal ideations, but has had some passive thoughts of dying due " to how bad my anxiety has been".  Patient presents with pressured speech and lability, often tearful during session.  Patient has been on psychiatric medications for years, mainly Xanax, Abilify,Luvox, and more recently ( 4 weeks or so) on Luvox . States she has been taking as prescribed, but does acknowledge she has been taking more Xanax than prescribed " trying to feel better ". She states that she realized that " I could be getting addicted to Xanax , so I stopped it" - last  took it 3-4 days ago. States, however, she was not using every day.  Currently no tremors, no diaphoresis, vitals stable, but reports feeling of " skin crawling", and presents somewhat restless , occasionally stutters .  Associated Signs/Symptoms: Depression Symptoms:  depressed  mood, anhedonia, insomnia, recurrent thoughts of death, erratic appetite  Reports a sense of excessive energy, which she attributes to anxiety, poor sleep (Hypo) Manic Symptoms:  Pressured speech , racing thoughts Anxiety Symptoms:  States she has been worrying excessively, has had panic attacks, mild agoraphobia Psychotic Symptoms: denies psychotic symptoms  PTSD Symptoms: Denies  Total Time spent with patient: 45 minutes  Past Psychiatric History: she has had several prior psychiatric admissions, for Eating Disorder- as a teenager and as a young adult ( [age], approximately  77)  for Anorexia,Bulimia. Last psychiatric admission more than 20 years ago  No history of suicide attempts or of self cutting, denies history of violence . No history of psychosis, describes long history of anxiety, describes both excessive worrying and panic. States she  has been diagnosed with Bipolar Disorder and with OCD.  Is the patient at risk to self? Yes.    Has the patient been a risk to self in the past 6 months? No.  Has the patient been a risk to self within the distant past? No.  Is the patient a risk to others? No.  Has the patient been a risk to others in the past 6 months? No.  Has the patient been a risk to others within the distant past? No.   Prior Inpatient Therapy:  as above  Prior Outpatient Therapy:  Dr. Curt Jews at [facility] is her outpatient psychiatrist , and sees [name] for psychotherapy.  Alcohol Screening: 1. How often do you have a drink containing alcohol?: Never 9. Have you or someone else been injured as a result of your drinking?: No  10. Has a relative or friend or a doctor or another health worker been concerned about your drinking or suggested you cut down?: No Alcohol Use Disorder Identification Test Final Score (AUDIT): 0 Brief Intervention: AUDIT score less than 7 or less-screening does not suggest unhealthy drinking-brief intervention not  indicated Substance Abuse History in the last 12 months:  Denies alcohol abuse, no drub abuse  Consequences of Substance Abuse: Denies  Previous Psychotropic Medications: has been on Abilify 10 mgrs daily,for years, Anafranil 100 mgrs QDAY , for years, Xanax 0.5 mgrs TID , for years ,  Luvox 100 mgrs QDAY ( x 5 weeks) , Vistaril PRNs  Psychological Evaluations:  No  Past Medical History:  Past Medical History:  Diagnosis Date  . Binge-eating and purging type anorexia nervosa   . Bipolar disorder ([place])   . Cystitis, interstitial   . OCD (obsessive compulsive disorder)     Past Surgical History:  Procedure Laterality Date  . BLADDER SURGERY    . CHOLECYSTECTOMY    . VOCAL CORD LATERALIZATION, ENDOSCOPIC APPROACH W/ MLB     Family History: father deceased, mother alive, remarried, has one sister Family Psychiatric  History: father was alcoholic, who passed away from complications of alcoholism when patient was 29, states there is a strong history of alcohol dependence in paternal family, a cousin committed suicide . Tobacco Screening:  Smokes half a pack per day Social History: married x 12 years, second husband, no children, works as Education officer, museum, currently on medical leave, denies legal issues, describes some financial difficulties  History  Alcohol Use No     History  Drug Use No    Additional Social History: Marital status: Married Number of Years Married: 12 What types of issues is patient dealing with in the relationship?: husband is very smart; but they drifted apart, however he is supportive and they love each other Does patient have children?: No  Allergies:   Allergies  Allergen Reactions  . Codeine Hives and Shortness Of Breath  . Meloxicam Other (See Comments)    Possible chest tightness - instructed by MD not to take  . Other Nausea Only    CODIENE-unable to enter under "Agent" for some reason  . Penicillins Hives, Swelling and Rash    Has patient had a PCN  reaction causing immediate rash, facial/tongue/throat swelling, SOB or lightheadedness with hypotension: yes Has patient had a PCN reaction causing severe rash involving mucus membranes or skin necrosis: no Has patient had a PCN reaction that required hospitalization: no Has patient had a PCN reaction occurring within the last 10 years: no If all of the above answers are "NO", then may proceed with Cephalosporin use.;    Lab Results:  Results for orders placed or performed during the hospital encounter of 12/03/15 (from the past 48 hour(s))  CBC with Differential/Platelet     Status: None   Collection Time: 12/03/15 12:00 PM  Result Value Ref Range   WBC 7.2 4.0 - 10.5 K/uL   RBC 4.39 3.87 - 5.11 MIL/uL   Hemoglobin 13.3 12.0 - 15.0 g/dL   HCT 39.0 36.0 - 46.0 %   MCV 88.8 78.0 - 100.0 fL   MCH 30.3 26.0 - 34.0 pg   MCHC 34.1 30.0 - 36.0 g/dL   RDW 13.3 11.5 - 15.5 %   Platelets 163 150 - 400 K/uL   Neutrophils Relative % 59 %   Neutro Abs 4.3 1.7 - 7.7 K/uL   Lymphocytes Relative 30 %  Lymphs Abs 2.1 0.7 - 4.0 K/uL   Monocytes Relative 8 %   Monocytes Absolute 0.6 0.1 - 1.0 K/uL   Eosinophils Relative 3 %   Eosinophils Absolute 0.2 0.0 - 0.7 K/uL   Basophils Relative 0 %   Basophils Absolute 0.0 0.0 - 0.1 K/uL  Comprehensive metabolic panel     Status: Abnormal   Collection Time: 12/03/15 12:00 PM  Result Value Ref Range   Sodium 138 135 - 145 mmol/L   Potassium 3.9 3.5 - 5.1 mmol/L   Chloride 103 101 - 111 mmol/L   CO2 24 22 - 32 mmol/L   Glucose, Bld 119 (H) 65 - 99 mg/dL   BUN 14 6 - 20 mg/dL   Creatinine, Ser 0.67 0.44 - 1.00 mg/dL   Calcium 9.5 8.9 - 10.3 mg/dL   Total Protein 7.0 6.5 - 8.1 g/dL   Albumin 4.4 3.5 - 5.0 g/dL   AST 48 (H) 15 - 41 U/L   ALT 76 (H) 14 - 54 U/L   Alkaline Phosphatase 68 38 - 126 U/L   Total Bilirubin 0.5 0.3 - 1.2 mg/dL   GFR calc non Af Amer >60 >60 mL/min   GFR calc Af Amer >60 >60 mL/min    Comment: (NOTE) The eGFR has been  calculated using the CKD EPI equation. This calculation has not been validated in all clinical situations. eGFR's persistently <60 mL/min signify possible Chronic Kidney Disease.    Anion gap 11 5 - 15  Ethanol     Status: None   Collection Time: 12/03/15 12:00 PM  Result Value Ref Range   Alcohol, Ethyl (B) <5 <5 mg/dL    Comment:        LOWEST DETECTABLE LIMIT FOR SERUM ALCOHOL IS 5 mg/dL FOR MEDICAL PURPOSES ONLY   Salicylate level     Status: None   Collection Time: 12/03/15 12:00 PM  Result Value Ref Range   Salicylate Lvl <5.7 2.8 - 30.0 mg/dL  Acetaminophen level     Status: Abnormal   Collection Time: 12/03/15 12:00 PM  Result Value Ref Range   Acetaminophen (Tylenol), Serum <10 (L) 10 - 30 ug/mL    Comment:        THERAPEUTIC CONCENTRATIONS VARY SIGNIFICANTLY. A RANGE OF 10-30 ug/mL MAY BE AN EFFECTIVE CONCENTRATION FOR MANY PATIENTS. HOWEVER, SOME ARE BEST TREATED AT CONCENTRATIONS OUTSIDE THIS RANGE. ACETAMINOPHEN CONCENTRATIONS >150 ug/mL AT 4 HOURS AFTER INGESTION AND >50 ug/mL AT 12 HOURS AFTER INGESTION ARE OFTEN ASSOCIATED WITH TOXIC REACTIONS.   hCG, quantitative, pregnancy     Status: None   Collection Time: 12/03/15 12:00 PM  Result Value Ref Range   hCG, Beta Chain, Quant, S 1 <5 mIU/mL    Comment:          GEST. AGE      CONC.  (mIU/mL)   <=1 WEEK        5 - 50     2 WEEKS       50 - 500     3 WEEKS       100 - 10,000     4 WEEKS     1,000 - 30,000     5 WEEKS     3,500 - 115,000   6-8 WEEKS     12,000 - 270,000    12 WEEKS     15,000 - 220,000        FEMALE AND NON-PREGNANT FEMALE:     LESS THAN 5  mIU/mL   Rapid urine drug screen (hospital performed)     Status: Abnormal   Collection Time: 12/03/15 12:42 PM  Result Value Ref Range   Opiates NONE DETECTED NONE DETECTED   Cocaine NONE DETECTED NONE DETECTED   Benzodiazepines POSITIVE (A) NONE DETECTED   Amphetamines NONE DETECTED NONE DETECTED   Tetrahydrocannabinol NONE DETECTED NONE  DETECTED   Barbiturates NONE DETECTED NONE DETECTED    Comment:        DRUG SCREEN FOR MEDICAL PURPOSES ONLY.  IF CONFIRMATION IS NEEDED FOR ANY PURPOSE, NOTIFY LAB WITHIN 5 DAYS.        LOWEST DETECTABLE LIMITS FOR URINE DRUG SCREEN Drug Class       Cutoff (ng/mL) Amphetamine      1000 Barbiturate      200 Benzodiazepine   878 Tricyclics       676 Opiates          300 Cocaine          300 THC              50     Blood Alcohol level:  Lab Results  Component Value Date   ETH <5 72/09/4707    Metabolic Disorder Labs:  No results found for: HGBA1C, MPG No results found for: PROLACTIN No results found for: CHOL, TRIG, HDL, CHOLHDL, VLDL, LDLCALC  Current Medications: Current Facility-Administered Medications  Medication Dose Route Frequency Provider Last Rate Last Dose  . acetaminophen (TYLENOL) tablet 650 mg  650 mg Oral Q4H PRN [name], NP      . alum & mag hydroxide-simeth (MAALOX/MYLANTA) 200-200-20 MG/5ML suspension 30 mL  30 mL Oral PRN [name], NP      . asenapine (SAPHRIS) sublingual tablet 5 mg  5 mg Sublingual BID [name], NP   5 mg at 12/05/15 0754  . clomiPRAMINE (ANAFRANIL) capsule 100 mg  100 mg Oral QHS [name], NP      . cycloSPORINE (RESTASIS) 0.05 % ophthalmic emulsion 1 drop  1 drop Both Eyes BID [name], NP   1 drop at 12/05/15 0755  . fluvoxaMINE (LUVOX) tablet 100 mg  100 mg Oral Daily [name], NP   100 mg at 12/05/15 0754  . hydrOXYzine (ATARAX/VISTARIL) tablet 10 mg  10 mg Oral TID PRN [name], NP   10 mg at 12/05/15 0150  . LORazepam (ATIVAN) tablet 1 mg  1 mg Oral Q6H PRN [name], NP   1 mg at 12/04/15 2314  . magnesium hydroxide (MILK OF MAGNESIA) suspension 30 mL  30 mL Oral Daily PRN [name], NP      . nicotine (NICODERM CQ - dosed in mg/24 hours) patch 21 mg  21 mg Transdermal Daily [name], NP   21 mg at 12/05/15 0752  . ondansetron (ZOFRAN) tablet 4 mg  4 mg Oral Q8H PRN  [name], NP       PTA Medications: Prescriptions Prior to Admission  Medication Sig Dispense Refill Last Dose  . ABILIFY 20 MG tablet Take 10 mg by mouth every morning.    12/03/2015 at Unknown time  . ALPRAZolam (XANAX) 1 MG tablet Take 0.5 mg by mouth 3 (three) times daily as needed for anxiety. For anxiety   12/02/2015 at Unknown time  . clomiPRAMINE (ANAFRANIL) 50 MG capsule Take 100 mg by mouth every morning.    12/03/2015 at Unknown time  . diltiazem (  CARDIZEM) 30 MG tablet Take 1 tablet (30 mg total) by mouth 2 (two) times daily. (Patient not taking: Reported on 12/03/2015) 60 tablet 6 Not Taking at Unknown time  . fluvoxaMINE (LUVOX) 100 MG tablet Take 100 mg by mouth every morning.   12/03/2015 at Unknown time  . hydrOXYzine (VISTARIL) 25 MG capsule Take 25-50 mg by mouth every 6 (six) hours as needed for anxiety.    12/02/2015 at 1400  . naproxen sodium (ANAPROX) 220 MG tablet Take 440 mg by mouth every 12 (twelve) hours as needed (pain).   Past Week at Unknown time  . RESTASIS 0.05 % ophthalmic emulsion Place 1 drop into both eyes 2 (two) times daily.    12/03/2015 at Unknown time    Musculoskeletal: Strength & Muscle Tone: within normal limits Gait & Station: normal Patient leans: N/A  Psychiatric Specialty Exam: Physical Exam  ROS  Blood pressure 117/81, pulse 87, temperature 97.8 F (36.6 C), temperature source Oral, resp. rate 16, height 5' 4"  (1.626 m), weight 147 lb 8 oz (66.9 kg), last menstrual period 10/07/2011.Body mass index is 25.32 kg/m.  General Appearance: Well Groomed  Eye Contact:  Good  Speech:  Pressured  Volume:  Normal  Mood:  labile and anxious   Affect:  Labile , intermittently tearful  Thought Process:  Reports " racing thoughts ", no overt loose associations   Orientation:  Full (Time, Place, and Person)  Thought Content:  no hallucinations, no delusions, not internally preoccupied   Suicidal Thoughts:  No denies any current suicidal or  ideations, denies self injurious ideations, contracts for safety on the unit  Homicidal Thoughts:  No denies any homicidal ideations  Memory:  recent and remote grossly intact   Judgement:  Fair  Insight:  Fair  Psychomotor Activity:  Vaguely restless  Concentration:  Concentration: Fair and Attention Span: Fair  Recall:  Good  Fund of Knowledge:  Good  Language:  Good  Akathisia:  Negative  Handed:  Right  AIMS (if indicated):     Assets:  Desire for Improvement Resilience Social Support  ADL's:  Intact  Cognition:  WNL  Sleep:  Number of Hours: 1    Treatment Plan Summary: Daily contact with patient to assess and evaluate symptoms and progress in treatment, Medication management, Plan inpatient admission and medications as below  Observation Level/Precautions:  15 minute checks  Laboratory:  HgbA1C, Lipid Panel, Prolactin, EKG , TSH, Hep C, Hep B serologies ( due to elevated LFTs)  Psychotherapy:  [place], group therapy   Medications:  We discussed options- has been started on Saphris which she states she feels is helping. Will D/C Clomipramine and D/C Luvox, as could be increasing mood instability, start Ativan Detox Protocol to minimize risk of BZD withdrawal  Consultations: as needed  Discharge Concerns:  -  Estimated LOS: 5-6 days   Other:     Physician Treatment Plan for Primary Diagnosis:  Bipolar Disorder, Mixed  Long Term Goal(s): Improvement in symptoms so as ready for discharge  Short Term Goals: Ability to verbalize feelings will improve, Ability to disclose and discuss suicidal ideas, Ability to demonstrate self-control will improve and Ability to identify and develop effective coping behaviors will improve  Physician Treatment Plan for Secondary Diagnosis: Active Problems:   Bipolar affective disorder, current episode manic without psychotic symptoms ([place])  Long Term Goal(s): Improvement in symptoms so as ready for discharge  Short Term Goals: Ability to  verbalize feelings will improve, Ability to disclose and  discuss suicidal ideas, Ability to demonstrate self-control will improve and Ability to identify and develop effective coping behaviors will improve  I certify that inpatient services furnished can reasonably be expected to improve the patient's condition.    Neita Garnet, MD 11/16/201710:28 AM

## 2015-12-05 NOTE — Progress Notes (Signed)
Nutrition Education Note  Pt attended group focusing on general, healthful nutrition education.  RD emphasized the importance of eating regular meals and snacks throughout the day. Consuming sugar-free beverages and incorporating fruits and vegetables into diet when possible. Provided examples of healthy snacks. Patient encouraged to leave group with a goal to improve nutrition/healthy eating.   Diet Order: Diet Heart Room service appropriate? Yes; Fluid consistency: Thin Pt is also offered choice of unit snacks mid-morning and mid-afternoon.  Pt is eating as desired.   If additional nutrition issues arise, please consult RD.    Jarome Matin, MS, RD, LDN Inpatient Clinical Dietitian Pager # [phone] After hours/weekend pager # [phone]

## 2015-12-05 NOTE — BHH Group Notes (Signed)
[facility] Mental Health Association Group Therapy 12/05/2015 1:15pm  Type of Therapy: Mental Health Association Presentation  Participation Level: Active  Participation Quality: Attentive  Affect: Appropriate  Cognitive: Oriented  Insight: Developing/Improving  Engagement in Therapy: Engaged  Modes of Intervention: Discussion, Education and Socialization  Summary of Progress/Problems: Mental Health Association ([place]) Speaker came to talk about his personal journey with substance abuse and addiction. The pt processed ways by which to relate to the speaker. [place] speaker provided handouts and educational information pertaining to groups and services offered by the [facility]. Pt was engaged in speaker's presentation and was receptive to resources provided.    Adriana Reams, LCSW 12/05/2015 1:28 PM

## 2015-12-05 NOTE — Progress Notes (Signed)
D:Pt has been anxious on the unit and speaks rapidly. She rates depression as a 4 and anxiety as a 7 on 0-10 scale with 10 being the most. Pt is pleasant and redirectable. Pt had been taking xanax is is currently on the ativan protocol.  A:Offered support, encouragement and 15 minute checks. R:Pt denies si and hi. Safety maintained on the unit.
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
## 2015-12-05 NOTE — BHH Suicide Risk Assessment (Signed)
[place] INPATIENT:  Family/Significant Other Suicide Prevention Education  Suicide Prevention Education:  Education Completed; Maebh Nour, Pt's husband [phone],  has been identified by the patient as the family member/significant other with whom the patient will be residing, and identified as the person(s) who will aid the patient in the event of a mental health crisis (suicidal ideations/suicide attempt).  With written consent from the patient, the family member/significant other has been provided the following suicide prevention education, prior to the and/or following the discharge of the patient.  The suicide prevention education provided includes the following:  Suicide risk factors  Suicide prevention and interventions  National Suicide Hotline telephone number  [facility] assessment telephone number  [facility] Emergency Assistance [place] and/or Residential Mobile Crisis Unit telephone number  Request made of family/significant other to:  Remove weapons (e.g., guns, rifles, knives), all items previously/currently identified as safety concern.    Remove drugs/medications (over-the-counter, prescriptions, illicit drugs), all items previously/currently identified as a safety concern.  The family member/significant other verbalizes understanding of the suicide prevention education information provided.  The family member/significant other agrees to remove the items of safety concern listed above.  [place] Lighter 12/05/2015, 11:52 AM

## 2015-12-06 LAB — LIPID PANEL
CHOL/HDL RATIO: 2.8 ratio
Cholesterol: 170 mg/dL (ref 0–200)
HDL: 61 mg/dL (ref >40)
LDL CALC: 90 mg/dL (ref 0–99)
Triglycerides: 95 mg/dL (ref <150)
VLDL: 19 mg/dL (ref 0–40)

## 2015-12-06 LAB — TSH: TSH: 1.392 u[IU]/mL (ref 0.350–4.500)

## 2015-12-06 MED ORDER — ASENAPINE MALEATE 5 MG SL SUBL
5.0000 mg | SUBLINGUAL_TABLET | Freq: Every day | SUBLINGUAL | Status: DC
Start: 2015-12-07 — End: 2015-12-08
  Administered 2015-12-07 – 2015-12-08 (×2): 5 mg via SUBLINGUAL
  Filled 2015-12-06 (×4): qty 1

## 2015-12-06 MED ORDER — ASENAPINE MALEATE 5 MG SL SUBL
10.0000 mg | SUBLINGUAL_TABLET | Freq: Every day | SUBLINGUAL | Status: DC
  Administered 2015-12-06 – 2015-12-07 (×2): 10 mg via SUBLINGUAL
  Filled 2015-12-06 (×4): qty 2

## 2015-12-06 NOTE — Progress Notes (Signed)
Recreation Therapy Notes  Date: 12/06/15 Time: 0930 Location: [address] Dayroom  Group Topic: Stress Management  Goal Area(s) Addresses:  Patient will verbalize importance of using healthy stress management.  Patient will identify positive emotions associated with healthy stress management.   Intervention: Stress Management  Activity :  Floating on a Cloud.  LRT introduced the stress management technique of guided imagery.  LRT read a script to allow patients to engage in the technique.  Patients were to follow along as LRT read script.  Education:  Stress Management, Discharge Planning.   Education Outcome: Acknowledges edcuation/In group clarification offered/Needs additional education  Clinical Observations/Feedback: Pt did not attend group.   Victorino Sparrow, LRT/CTRS         Victorino Sparrow A 12/06/2015 12:34 PM

## 2015-12-06 NOTE — Progress Notes (Signed)
Data. Patient denies SI/HI/AVH. Patient interacting well with staff and other patients. Patient reports she is seeing a,  "little girl outside the (unit) window, who was waving at me".. She also reports that see turned her head last evening and her roommate had, "two heads". Then when the lights were turned off, she states she saw, "shapes and a light show". She also reports  Being, "constipated", and received MOM. On her self assessment she reports 5/10 for depression, 4/10 for hopelessness and 5-6/10 for anxiety. Her goal for today is: "I want to work on coping skills for how to be less anxious".  Action. Emotional support and encouragement offered. Education provided on medication, indications and side effect. Q 15 minute checks done for safety. Response. Safety on the unit maintained through 15 minute checks.  Medications taken as prescribed. Attended groups. Remained calm and appropriate through out shift.

## 2015-12-06 NOTE — [facility] Group Notes (Signed)
[place] LCSW Group Therapy 12/06/2015 1:15pm  Type of Therapy: Group Therapy- Feelings Around Relapse and Recovery  Pt did not attend, declined invitation.   Adriana Reams, LCSW [phone] 12/06/2015 1:58 PM

## 2015-12-06 NOTE — Progress Notes (Signed)
[facility] MD Progress Note  12/06/2015 10:42 AM Jennifer [name]  MRN:  [id number] Subjective:  Patient reports she is feeling "OK" today, but reports yesterday had some visual hallucinations which were frightening to her- reports having seen people outside of her window, and seeing a cup moving on table . She states she has had auditory hallucinations in the past, but denies any prior history of visual hallucinations. At this time describes some ongoing anxiety, mood lability, but states she feels better than prior to admission and denies any suicidal ideations. Objective : I have discussed case with treatment team and have met with patient . She presents partially improved compared to admission , with a less labile affect and slower, less pressured speech. No disruptive or overtly agitated behaviors on unit . Some group attendance  Denies medication side effects. 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 At this time denies hallucinations, and does not present internally preoccupied . Currently no clear symptoms of BZD withdrawal- no tremors, no diaphoresis, no psychomotor agitation and vitals are stable. Sleep remains fair, but states it was better than prior night- states she had " about 4 or 5 hours of solid sleep". Labs reviewed- TSH, Lipid panel WNL Principal Problem: Mixed bipolar I disorder (HCC) Diagnosis:   Patient Active Problem List   Diagnosis Date Noted  . Mixed bipolar I disorder ([place]) [F31.60] 12/05/2015  . OCD (obsessive compulsive disorder) [F42.9] 12/04/2015  . Bipolar affective disorder ([place]) [F31.9] 12/04/2015  . Bipolar affective disorder, current episode manic without psychotic symptoms ([place]) [F31.10] 12/04/2015  . Palpitations [R00.2] 05/25/2014  . Chest pain [R07.9]  05/25/2014  . Cellulitis and abscess of buttock [L02.31, L03.317] 02/15/2013   Total Time spent with patient: 20 minutes  Past Medical History:  Past Medical History:  Diagnosis Date  . Binge-eating and purging type anorexia nervosa   . Bipolar disorder ([place])   . Cystitis, interstitial   . OCD (obsessive compulsive disorder)     Past Surgical History:  Procedure Laterality Date  . BLADDER SURGERY    . CHOLECYSTECTOMY    . VOCAL CORD LATERALIZATION, ENDOSCOPIC APPROACH W/ MLB     Family History: History reviewed. No pertinent family history.  Social History:  History  Alcohol Use No     History  Drug Use No    Social History   Social History  . Marital status: Married    Spouse name: N/A  . Number of children: N/A  . Years of education: N/A   Social History Main Topics  . Smoking status: Current Every Day Smoker    Packs/day: 0.50    Types: Cigarettes    Start date: 01/20/1988  . Smokeless tobacco: Never Used  . Alcohol use No  . Drug use: No  . Sexual activity: Yes    Birth control/ protection: None   Other Topics Concern  . None   Social History Narrative  . None   Additional Social History:   Sleep: Poor, partially improved compared to admission  Appetite:  Good  Current Medications: Current Facility-Administered Medications  Medication Dose Route Frequency Provider Last Rate Last Dose  . acetaminophen (TYLENOL) tablet 650 mg  650 mg Oral Q4H PRN [name], NP      . alum & mag hydroxide-simeth (MAALOX/MYLANTA) 200-200-20 MG/5ML suspension 30 mL  30 mL Oral PRN [name], NP      . asenapine (SAPHRIS) sublingual tablet 5 mg  5 mg Sublingual BID [name], NP   5 mg at 12/06/15 0804  . cycloSPORINE (RESTASIS) 0.05 % ophthalmic emulsion 1 drop  1 drop Both Eyes BID [name], NP   1 drop at 12/06/15 0811  . fluvoxaMINE (LUVOX) tablet 50 mg  50 mg Oral Daily [name], MD   50 mg at 12/06/15 0804  . hydrOXYzine  (ATARAX/VISTARIL) tablet 25 mg  25 mg Oral Q6H PRN [name], MD   25 mg at 12/05/15 2306  . loperamide (IMODIUM) capsule 2-4 mg  2-4 mg Oral PRN [name], MD      . LORazepam (ATIVAN) tablet 1 mg  1 mg Oral Q6H PRN [name], MD      . LORazepam (ATIVAN) tablet 1 mg  1 mg Oral TID [name], MD       Followed by  . [START ON 12/07/2015] LORazepam (ATIVAN) tablet 1 mg  1 mg Oral BID [name], MD       Followed by  . [START ON 12/09/2015] LORazepam (ATIVAN) tablet 1 mg  1 mg Oral Daily [name], MD      . magnesium hydroxide (MILK OF MAGNESIA) suspension 30 mL  30 mL Oral Daily PRN [name], NP   30 mL at 12/06/15 0843  . multivitamin with minerals tablet 1 tablet  1 tablet Oral Daily [name], MD   1 tablet at 12/06/15 0804  . nicotine (NICODERM CQ - dosed in mg/24 hours) patch 21 mg  21 mg Transdermal Daily [name], NP   21 mg at 12/05/15 0752  . ondansetron (ZOFRAN-ODT) disintegrating tablet 4 mg  4 mg Oral Q6H PRN [name], MD      . thiamine (B-1) injection 100 mg  100 mg Intramuscular Once [name], MD      . thiamine (VITAMIN B-1) tablet 100 mg  100 mg Oral Daily [name], MD   100 mg at 12/06/15 1610    Lab Results:  Results for orders placed or performed during the hospital encounter of 12/04/15 (from the past 48 hour(s))  Lipid panel     Status: None   Collection Time: 12/06/15  6:09 AM  Result Value Ref Range   Cholesterol 170 0 - 200 mg/dL   Triglycerides 95 <150 mg/dL   HDL 61 >40 mg/dL   Total CHOL/HDL Ratio 2.8 RATIO   VLDL 19 0 - 40 mg/dL   LDL Cholesterol 90 0 - 99 mg/dL    Comment:        Total Cholesterol/HDL:CHD Risk Coronary Heart Disease Risk Table                     Men   Women  1/2 Average Risk   3.4   3.3  Average Risk       5.0   4.4  2 X Average Risk   9.6   7.1  3 X Average Risk  23.4   11.0        Use the calculated Patient Ratio above and the CHD Risk Table to  determine the patient's CHD Risk.        ATP III CLASSIFICATION (LDL):  <100     mg/dL   Optimal  100-129  mg/dL  Near or Above                    Optimal  130-159  mg/dL   Borderline  698-028  mg/dL   High  >721     mg/dL   Very High Performed at [facility]   TSH     Status: None   Collection Time: 12/06/15  6:09 AM  Result Value Ref Range   TSH 1.392 0.350 - 4.500 uIU/mL    Comment: Performed by a 3rd Generation assay with a functional sensitivity of <=0.01 uIU/mL. Performed at [facility]     Blood Alcohol level:  Lab Results  Component Value Date   [facility] <5 12/03/2015    Metabolic Disorder Labs: No results found for: HGBA1C, MPG No results found for: PROLACTIN Lab Results  Component Value Date   CHOL 170 12/06/2015   TRIG 95 12/06/2015   HDL 61 12/06/2015   CHOLHDL 2.8 12/06/2015   VLDL 19 12/06/2015   LDLCALC 90 12/06/2015    Physical Findings: AIMS: Facial and Oral Movements Muscles of Facial Expression: None, normal Lips and Perioral Area: None, normal Jaw: None, normal Tongue: None, normal,Extremity Movements Upper (arms, wrists, hands, fingers): None, normal Lower (legs, knees, ankles, toes): None, normal, Trunk Movements Neck, shoulders, hips: None, normal, Overall Severity Severity of abnormal movements (highest score from questions above): None, normal Incapacitation due to abnormal movements: None, normal Patient's awareness of abnormal movements (rate only patient's report): No Awareness, Dental Status Current problems with teeth and/or dentures?: No Does patient usually wear dentures?: No  CIWA:  CIWA-Ar Total: 0 COWS:     Musculoskeletal: Strength & Muscle Tone: within normal limits- no tremors , no diaphoresis, vitals stable Gait & Station: normal Patient leans: N/A  Psychiatric Specialty Exam: Physical Exam  ROS denies headache, denies , denies chest pain, no shortness of breath, no vomiting .   Blood  pressure 111/71, pulse 96, temperature 98.6 F (37 C), temperature source Oral, resp. rate 18, height 5\' 4"  (1.626 m), weight 147 lb 8 oz (66.9 kg), last menstrual period 10/07/2011.Body mass index is 25.32 kg/m.  General Appearance: improved grooming   Eye Contact:  Good  Speech:  Remains increased, but not pressured, improved compared to admission  Volume:  Normal  Mood:  states she is feeling better, remains labile   Affect:  partially improved, less labile  Thought Process:  Better organized  Orientation:  Fully alert and attentive   Thought Content:  describes new onset visual hallucinations yesterday- states she saw people outside her window, and a cup moving accross a table- these experiences were yesterday , denies current visual or auditory hallucinations and does not appear internally preoccupied , no delusions expressed at this time  Suicidal Thoughts:  No- denies any suicidal ideations, denies any self injurious ideations, contracts for safety on the unit   Homicidal Thoughts:  No denies any homicidal or violent ideations  Memory:  recent and remote grossly intact   Judgement:  Fair  Insight:  Fair  Psychomotor Activity:  Normal- no tremors, no diaphoresis, no psychomotor restlessness   Concentration:  Concentration: Good and Attention Span: Good  Recall:  Good  Fund of Knowledge:  Good  Language:  Good  Akathisia:  Negative  Handed:  Right  AIMS (if indicated):     Assets:  Desire for Improvement Resilience Social Support  ADL's:  Intact  Cognition:  WNL  Sleep:  Number of Hours: 1  Assessment - patient reports feeling better, and does present calmer,less pressured, less labile, and reports improving sleep, although still poor to fair. She is not presenting with any clear symptoms of BZD withdrawal at this time, and vitals are stable. Of note, she reports a history of auditory hallucinations during past decompensations but denies any prior history of visual  hallucinations, which she experienced yesterday. Denies medication side effects, and is tolerating Saphris trial well thus far.  Treatment Plan Summary: Daily contact with patient to assess and evaluate symptoms and progress in treatment, Medication management, Plan inpatient treatment  and medications as below  Encourage group and milieu participation to work on coping skills and symptom reduction  Increase Saphris to 5 mgrs QAM SL and 10 mgrs QHS SL to address ongoing mood disorder and psychotic symptoms Continue Luvox 50 mgrs QDAY for history of OCD, anxiety Continue Ativan detox protocol to minimize risk of BZD withdrawal We discussed possible Head MRI , based on her history of new onset visual hallucinations- it should be noted patient denies and has no other neurological symptoms. She states she prefers not to have /defer  this test at this time.  Neita Garnet, MD 12/06/2015, 10:42 AM

## 2015-12-06 NOTE — Plan of Care (Signed)
Problem: Self-Concept: Goal: Level of anxiety will decrease Outcome: Progressing Patient has had brighter affect and been smiling this shift.

## 2015-12-06 NOTE — Progress Notes (Signed)
D: Patient reports seeing group of people through the window staring at her. Patient stated "I called my room mate to look through the window but the room mate states "I did not see anybody". Patient requested that the curtain taped to the wall so she can not look or see outside. Patient also complained that "I sometimes feels like I'm gonna trip when I walk". Denies pain, endorses VH stated "I see lights all over the room. Denies AH, SI. No behavioral issues noted.  A: Staff offered support as needed. Staff encouraged patient to call for help when needed. Due/prn meds given as ordered. Routine checks for safety maintained. Will continue to monitor patient for safety and stability. R: Patient remain safe.

## 2015-12-07 LAB — HEMOGLOBIN A1C
Hgb A1c MFr Bld: 5.4 % (ref 4.8–5.6)
Mean Plasma Glucose: 108 mg/dL

## 2015-12-07 LAB — HEPATITIS C ANTIBODY

## 2015-12-07 LAB — PROLACTIN: PROLACTIN: 13.7 ng/mL (ref 4.8–23.3)

## 2015-12-07 LAB — HEPATITIS B SURFACE ANTIGEN: HEP B S AG: NEGATIVE

## 2015-12-07 MED ORDER — TRAZODONE HCL 50 MG PO TABS
50.0000 mg | ORAL_TABLET | Freq: Every evening | ORAL | Status: DC | PRN
Start: 2015-12-07 — End: 2015-12-08
  Administered 2015-12-07: 50 mg via ORAL
  Filled 2015-12-07: qty 1

## 2015-12-07 NOTE — [facility] Group Notes (Signed)
Patient attend group. Her day was a 8. Patient said she learned 3 to 5 triggers. Anxiety for today, she need to work on her financial concerns, family concerns and how to work toward having a better day. She has been writing down her feelings in her journal, to  keep track her thoughts.

## 2015-12-07 NOTE — [facility] Group Notes (Signed)
Adult Psychoeducational Group Note  Date:  12/07/2015 Time:  10:15 PM  Group Topic/Focus:  Wrap-Up Group:   The focus of this group is to help patients review their daily goal of treatment and discuss progress on daily workbooks.   Participation Level:  Active  Participation Quality:  Appropriate and Attentive  Affect:  Appropriate  Cognitive:  Alert  Insight: Appropriate  Engagement in Group:  Engaged  Modes of Intervention:  Discussion and Education  Additional Comments:  Patient reported working to obtaining an adequate amount of sleep to assist with her treatment due to constantly feeling anxious.  Patient reported intending to work to sleep and utilizing relaxation techniques, such as reading to assist in the process.    [name] N 12/07/2015, 10:15 PM

## 2015-12-07 NOTE — [facility] Group Notes (Signed)
[place] Group Notes: (Clinical Social Work)   12/07/2015      Type of Therapy:  Group Therapy   Participation Level:  Did Not Attend despite MHT prompting   Selmer Dominion, LCSW 12/07/2015, 12:13 PM

## 2015-12-07 NOTE — Progress Notes (Signed)
D: Pt +ve VH, denies SI/HI/AH. Pt is pleasant and cooperative. Pt stated she had a good day, felt a little lonely. Pt appears to be minimizing her AVH to this writer, pt appears to present like she is preoccupied at times on the unit.   A: Pt was offered support and encouragement. Pt was given scheduled medications. Pt was encourage to attend groups. Q 15 minute checks were done for safety.   R:Pt attends groups and interacts well with peers and staff. Pt is taking medication. Pt has no complaints at this time.Pt receptive to treatment and safety maintained on unit.

## 2015-12-07 NOTE — Progress Notes (Signed)
[facility] MD Progress Note  12/07/2015 10:28 AM Jennifer [name]  MRN:  [id number] Subjective:  Patient reports she has been having ongoing symptoms of sadness, depression, but states she has been feeling better than before admission , and she states she does feel " like I am more stable". She reports insomnia. At this time denies suicidal ideations, and is future oriented - states " I just want to continue getting better so I can hopefully go home soon". Denies medication side effects. No akathisia reported . Objective : I have discussed case with treatment team and have met with patient . Patient presents improved compared to admission, but reports some ongoing sense of depression, which she attributes at least partially to being away from family and pet dog . 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 No disruptive or agitated behaviors, visible on unit, going to groups. Denies any further auditory or visual hallucinations, and does not present internally preoccupied or psychotic. Denies medication side effects. Reports ongoing insomnia. At this time no symptoms of BZD withdrawal- no tremors, no diaphoresis, no psychomotor restlessness or agitation. Labs reviewed as below.  Principal Problem: Mixed bipolar I disorder ([place]) Diagnosis:   Patient Active Problem List   Diagnosis Date Noted  . Mixed bipolar I disorder ([place]) [F31.60] 12/05/2015  . OCD (obsessive compulsive disorder) [F42.9] 12/04/2015  . Bipolar affective disorder ([place]) [F31.9] 12/04/2015  . Bipolar affective disorder, current episode manic without psychotic symptoms ([place]) [F31.10] 12/04/2015  . Palpitations [R00.2] 05/25/2014  . Chest pain [R07.9] 05/25/2014  . Cellulitis and abscess of buttock [L02.31, L03.317] 02/15/2013   Total Time spent with patient: 20 minutes  Past Medical History:  Past Medical History:  Diagnosis Date  .  Binge-eating and purging type anorexia nervosa   . Bipolar disorder ([place])   . Cystitis, interstitial   . OCD (obsessive compulsive disorder)     Past Surgical History:  Procedure Laterality Date  . BLADDER SURGERY    . CHOLECYSTECTOMY    . VOCAL CORD LATERALIZATION, ENDOSCOPIC APPROACH W/ MLB     Family History: History reviewed. No pertinent family history.  Social History:  History  Alcohol Use No     History  Drug Use No    Social History   Social History  . Marital status: Married    Spouse name: N/A  . Number of children: N/A  . Years of education: N/A   Social History Main Topics  . Smoking status: Current Every Day Smoker    Packs/day: 0.50    Types: Cigarettes    Start date: 01/20/1988  . Smokeless tobacco: Never Used  . Alcohol use No  . Drug use: No  . Sexual activity: Yes    Birth control/ protection: None   Other Topics Concern  . None   Social History Narrative  . None   Additional Social History:   Sleep: Poor  Appetite:  Good  Current Medications: Current Facility-Administered Medications  Medication Dose Route Frequency Provider Last Rate Last Dose  . acetaminophen (TYLENOL) tablet 650 mg  650 mg Oral Q4H PRN [name], NP      . alum & mag hydroxide-simeth (MAALOX/MYLANTA) 200-200-20 MG/5ML suspension 30 mL  30 mL Oral PRN [name], NP      . asenapine (SAPHRIS) sublingual tablet 10 mg  10 mg Sublingual QHS [name], MD   10 mg at 12/06/15 2121  .  asenapine (SAPHRIS) sublingual tablet 5 mg  5 mg Sublingual Daily [name], MD   5 mg at 12/07/15 0850  . cycloSPORINE (RESTASIS) 0.05 % ophthalmic emulsion 1 drop  1 drop Both Eyes BID [name], NP   1 drop at 12/07/15 0849  . fluvoxaMINE (LUVOX) tablet 50 mg  50 mg Oral Daily [name], MD   50 mg at 12/07/15 0849  . hydrOXYzine (ATARAX/VISTARIL) tablet 25 mg  25 mg Oral Q6H PRN [name], MD   25 mg at 12/06/15 2121  . loperamide (IMODIUM) capsule  2-4 mg  2-4 mg Oral PRN [name], MD      . LORazepam (ATIVAN) tablet 1 mg  1 mg Oral Q6H PRN [name], MD   1 mg at 12/06/15 2121  . LORazepam (ATIVAN) tablet 1 mg  1 mg Oral BID [name], MD       Followed by  . [START ON 12/09/2015] LORazepam (ATIVAN) tablet 1 mg  1 mg Oral Daily Marquitta Persichetti A Wilbert Schouten, MD      . magnesium hydroxide (MILK OF MAGNESIA) suspension 30 mL  30 mL Oral Daily PRN [name], NP   30 mL at 12/06/15 0843  . multivitamin with minerals tablet 1 tablet  1 tablet Oral Daily [name], MD   1 tablet at 12/07/15 0849  . nicotine (NICODERM CQ - dosed in mg/24 hours) patch 21 mg  21 mg Transdermal Daily [name], NP   21 mg at 12/05/15 0752  . ondansetron (ZOFRAN-ODT) disintegrating tablet 4 mg  4 mg Oral Q6H PRN [name], MD      . thiamine (B-1) injection 100 mg  100 mg Intramuscular Once Myer Peer Zylee Marchiano, MD      . thiamine (VITAMIN B-1) tablet 100 mg  100 mg Oral Daily [name], MD   100 mg at 12/07/15 0849  . traZODone (DESYREL) tablet 50 mg  50 mg Oral QHS PRN [name], MD        Lab Results:  Results for orders placed or performed during the hospital encounter of 12/04/15 (from the past 48 hour(s))  Hemoglobin A1c     Status: None   Collection Time: 12/06/15  6:09 AM  Result Value Ref Range   Hgb A1c MFr Bld 5.4 4.8 - 5.6 %    Comment: (NOTE)         Pre-diabetes: 5.7 - 6.4         Diabetes: >6.4         Glycemic control for adults with diabetes: <7.0    Mean Plasma Glucose 108 mg/dL    Comment: (NOTE) Performed At: [facility] [place], [place] [id number] Lindon Romp MD [id number] Performed at [facility]   Lipid panel     Status: None   Collection Time: 12/06/15  6:09 AM  Result Value Ref Range   Cholesterol 170 0 - 200 mg/dL   Triglycerides 95 <150 mg/dL   HDL 61 >40 mg/dL   Total CHOL/HDL Ratio 2.8 RATIO   VLDL 19 0 - 40 mg/dL   LDL  Cholesterol 90 0 - 99 mg/dL    Comment:        Total Cholesterol/HDL:CHD Risk Coronary Heart Disease Risk Table                     Men   Women  1/2 Average Risk   3.4   3.3  Average Risk       5.0   4.4  2 X Average Risk   9.6   7.1  3 X Average Risk  23.4   11.0        Use the calculated Patient Ratio above and the CHD Risk Table to determine the patient's CHD Risk.        ATP III CLASSIFICATION (LDL):  <100     mg/dL   Optimal  100-129  mg/dL   Near or Above                    Optimal  130-159  mg/dL   Borderline  160-189  mg/dL   High  >190     mg/dL   Very High Performed at [facility]   Prolactin     Status: None   Collection Time: 12/06/15  6:09 AM  Result Value Ref Range   Prolactin 13.7 4.8 - 23.3 ng/mL    Comment: (NOTE) Performed At: [facility] [place], [place] [id number] Lindon Romp MD IW:[id number] Performed at [facility]   TSH     Status: None   Collection Time: 12/06/15  6:09 AM  Result Value Ref Range   TSH 1.392 0.350 - 4.500 uIU/mL    Comment: Performed by a 3rd Generation assay with a functional sensitivity of <=0.01 uIU/mL. Performed at [facility]   Hepatitis C antibody     Status: None   Collection Time: 12/06/15  6:09 AM  Result Value Ref Range   HCV Ab <0.1 0.0 - 0.9 s/co ratio    Comment: (NOTE)                                  Negative:     < 0.8                             Indeterminate: 0.8 - 0.9                                  Positive:     > 0.9 The CDC recommends that a positive HCV antibody result be followed up with a HCV Nucleic Acid Amplification test (174081). Performed At: [facility] [place], [place] [id number] Lindon Romp MD [id number] Performed at [facility]   Hepatitis B surface antigen     Status: None   Collection Time: 12/06/15  6:09 AM  Result Value Ref Range   Hepatitis B Surface Ag  Negative Negative    Comment: (NOTE) Performed At: [facility] [place], [place] [id number] Lindon Romp MD [id number] Performed at [facility]     Blood Alcohol level:  Lab Results  Component Value Date   [facility] <5 96/28/3662    Metabolic Disorder Labs: Lab Results  Component Value Date   HGBA1C 5.4 12/06/2015   MPG 108 12/06/2015   Lab Results  Component Value Date   PROLACTIN 13.7 12/06/2015   Lab Results  Component Value Date   CHOL 170 12/06/2015   TRIG 95 12/06/2015   HDL 61 12/06/2015   CHOLHDL 2.8 12/06/2015   VLDL 19 12/06/2015  [place] 90 12/06/2015    Physical Findings: AIMS: Facial and Oral Movements Muscles of Facial Expression: None, normal Lips and Perioral Area: None, normal Jaw: None, normal Tongue: None, normal,Extremity Movements Upper (arms, wrists, hands, fingers): None, normal Lower (legs, knees, ankles, toes): None, normal, Trunk Movements Neck, shoulders, hips: None, normal, Overall Severity Severity of abnormal movements (highest score from questions above): None, normal Incapacitation due to abnormal movements: None, normal Patient's awareness of abnormal movements (rate only patient's report): No Awareness, Dental Status Current problems with teeth and/or dentures?: No Does patient usually wear dentures?: No  CIWA:  CIWA-Ar Total: 2 COWS:     Musculoskeletal: Strength & Muscle Tone: within normal limits- no tremors , no diaphoresis, vitals stable Gait & Station: normal Patient leans: N/A  Psychiatric Specialty Exam: Physical Exam  ROS denies headache, denies , denies chest pain, no shortness of breath, no vomiting . No RUQ pain or discomfort   Blood pressure 116/86, pulse 97, temperature 97.9 F (36.6 C), temperature source Oral, resp. rate 18, height _0  (1.626 m), weight 147 lb 8 oz (66.9 kg), last menstrual period 10/07/2011.Body mass index is 25.32 kg/m.  General Appearance:  improved grooming   Eye Contact:  Good  Speech:  Normal   Volume:  Normal  Mood: reports persistent depression,but states she is feeling better than on admission  Affect:  Constricted, but reactive and improved during session  Thought Process:  Linear, well organized   Orientation:  Fully alert and attentive   Thought Content:  Denies auditory or visual hallucinations, no delusions, not internally preoccupied   Suicidal Thoughts:  No- denies any suicidal ideations, denies any self injurious ideations, contracts for safety on the unit   Homicidal Thoughts:  No denies any homicidal or violent ideations  Memory:  recent and remote grossly intact   Judgement:  Improving   Insight:  Improving   Psychomotor Activity:  Normal- no tremors, no diaphoresis, no psychomotor restlessness   Concentration:  Concentration: Good and Attention Span: Good  Recall:  Good  Fund of Knowledge:  Good  Language:  Good  Akathisia:  Negative  Handed:  Right  AIMS (if indicated):     Assets:  Desire for Improvement Resilience Social Support  ADL's:  Intact  Cognition:  WNL  Sleep:  Number of Hours: 6.25   Assessment -patient presents some persistent depression, but improved compared to admission. No suicidal ideations . No further hallucinations and states visual hallucinations she had reported have not persisted and consisted of  one isolated episode two days ago . Insomnia is an ongoing issue. No current significant withdrawal symptoms. Tolerating medications well  Hep B/C serologies negative  Treatment Plan Summary: Daily contact with patient to assess and evaluate symptoms and progress in treatment, Medication management, Plan inpatient treatment  and medications as below  Encourage group and milieu participation to work on coping skills and symptom reduction  Continue S[place]ris  5 mgrs QAM SL and 10 mgrs QHS SL to address  mood disorder and psychotic symptoms Continue Luvox 50 mgrs QDAY for history of  OCD, anxiety Continue Ativan detox protocol to minimize risk of BZD withdrawal Start Trazodone 50 mgrs QHS PRN for insomnia- side effects discussed  Repeat LFTs in AM  Neita Garnet, MD 12/07/2015, 10:28 AMPatient ID: [name], female   DOB: 15-Mar-1965, 50 y.o.   MRN: [id number]

## 2015-12-07 NOTE — Progress Notes (Signed)
D: Patient's self inventory sheet: patient has poor sleep, recieved sleep medication.good  Appetite, normal energy level, poor concentration. Rated depression 5/10 (from no sleep per patient), hopeless 2/10, anxiety 2/10. SI/HI/AVH: Denies all, including AVH which she says she has not had for more than 24 hours. Physical complaints are jerks oor tremors when trying to sleep. Goal is "I need sleep meds". Plans to work on "ask for them again".   A: Medications administered, assessed medication knowledge and education given on medication regimen. Pt met with MD and agreed to try Trazadone for sleep. Emotional support and encouragement given patient. R: Denies SI and HI , contracts for safety. Safety maintained with 15 minute checks.

## 2015-12-08 LAB — HEPATIC FUNCTION PANEL
ALBUMIN: 4.9 g/dL (ref 3.5–5.0)
ALK PHOS: 70 U/L (ref 38–126)
ALT: 37 U/L (ref 14–54)
AST: 28 U/L (ref 15–41)
BILIRUBIN TOTAL: 0.5 mg/dL (ref 0.3–1.2)
Bilirubin, Direct: 0.1 mg/dL (ref 0.1–0.5)
Indirect Bilirubin: 0.4 mg/dL (ref 0.3–0.9)
Total Protein: 7.9 g/dL (ref 6.5–8.1)

## 2015-12-08 MED ORDER — ASENAPINE MALEATE 5 MG SL SUBL: 5.0000 mg | SUBLINGUAL_TABLET | Freq: Every day | SUBLINGUAL | 0 refills | Status: DC

## 2015-12-08 MED ORDER — NICOTINE 21 MG/24HR TD PT24: 21.0000 mg | MEDICATED_PATCH | Freq: Every day | TRANSDERMAL | 0 refills | Status: DC

## 2015-12-08 MED ORDER — TRAZODONE HCL 50 MG PO TABS: 50.0000 mg | ORAL_TABLET | Freq: Every evening | ORAL | 0 refills | Status: DC | PRN

## 2015-12-08 MED ORDER — FLUVOXAMINE MALEATE 50 MG PO TABS: 50.0000 mg | ORAL_TABLET | Freq: Every day | ORAL | 0 refills | Status: DC

## 2015-12-08 MED ORDER — ASENAPINE MALEATE 5 MG SL SUBL: 10.0000 mg | SUBLINGUAL_TABLET | Freq: Every day | SUBLINGUAL | 0 refills | Status: DC

## 2015-12-08 MED ORDER — HYDROXYZINE HCL 25 MG PO TABS: 25.0000 mg | ORAL_TABLET | Freq: Four times a day (QID) | ORAL | 0 refills | Status: DC | PRN

## 2015-12-08 NOTE — Progress Notes (Signed)
Patient reports that she has had a good day today and feels that her medications are working better for her. She has been informed and is aware that she has trazadone available for sleep and is encouraged to take it tonight. She has been up in the dayroom briefly and has been inn her room reading she reports. Support given and safety maintained on unit with 15 min checks

## 2015-12-08 NOTE — Progress Notes (Signed)
Patient in and out of bed and at nurses station with complaints of not being able to sleep. Patient requesting snacks, wanting to stay up and read, checking the time several times during the night. Patient offered a repeat dose of trazodone if ordered; however she declined medication. Support and encouragement offered and Q 15 minute checks continued. Corene Cornea NP aware.

## 2015-12-08 NOTE — Discharge Summary (Signed)
Physician Discharge Summary Note  Patient:  Jennifer [name] is an 50 y.o., female MRN:  [id number] DOB:  Oct 10, 1965 Patient phone:  [phone] (home)  Patient address:   [address] [place] [address],  Total Time spent with patient: 30 minutes  Date of Admission:  12/04/2015 Date of Discharge: 12/08/2015  Reason for Admission:    Principal Problem: Mixed bipolar I disorder [facility]) Discharge Diagnoses: Patient Active Problem List   Diagnosis Date Noted  . Mixed bipolar I disorder ([place]) [F31.60] 12/05/2015  . OCD (obsessive compulsive disorder) [F42.9] 12/04/2015  . Bipolar affective disorder ([place]) [F31.9] 12/04/2015  . Bipolar affective disorder, current episode manic without psychotic symptoms ([place]) [F31.10] 12/04/2015  . Palpitations [R00.2] 05/25/2014  . Chest pain [R07.9] 05/25/2014  . Cellulitis and abscess of buttock [L02.31, L03.317] 02/15/2013    Past Psychiatric History:  See HPI  Past Medical History:  Past Medical History:  Diagnosis Date  . Binge-eating and purging type anorexia nervosa   . Bipolar disorder ([place])   . Cystitis, interstitial   . OCD (obsessive compulsive disorder)     Past Surgical History:  Procedure Laterality Date  . BLADDER SURGERY    . CHOLECYSTECTOMY    . VOCAL CORD LATERALIZATION, ENDOSCOPIC APPROACH W/ MLB     Family History: History reviewed. No pertinent family history. Family Psychiatric  History: see HPI Social History:  History  Alcohol Use No     History  Drug Use No    Social History   Social History  . Marital status: Married    Spouse name: N/A  . Number of children: N/A  . Years of education: N/A   Social History Main Topics  . Smoking status: Current Every Day Smoker    Packs/day: 0.50    Types: Cigarettes    Start date: 01/20/1988  . Smokeless tobacco: Never Used  . Alcohol use No  . Drug use: No  . Sexual activity: Yes    Birth control/ protection: None   Other Topics Concern  . None   Social  History Narrative  . None    Hospital Course:   Jennifer [name], 50 year old female presented to ED due to worsening anxiety, a subjective feeling of " skin crawling", and difficulty functioning in her daily activities, stated her psychiatric medications " seem not be working anymore."   Jennifer [name] was admitted for Mixed bipolar I disorder ([place]) and crisis management.  Patient was treated with medications with their indications listed below in detail under Medication List.  Medical problems were identified and treated as needed.  Home medications were restarted as appropriate.  Improvement was monitored by observation and Jennifer [name] daily report of symptom reduction.  Emotional and mental status was monitored by daily self inventory reports completed by Jennifer [name] and clinical staff.  Patient reported continued improvement, denied any new concerns.  Patient had been compliant on medications and denied side effects.  Support and encouragement was provided.    Patient encouraged to attend groups to help with recognizing triggers of emotional crises and de-stabilizations.  Patient encouraged to attend group to help identify the positive things in life that would help in dealing with feelings of loss, depression and unhealthy or abusive tendencies.         Jennifer [name] was evaluated by the treatment team for stability and plans for continued recovery upon discharge.  Patient was offered further treatment options upon discharge including Residential, Intensive Outpatient and Outpatient  treatment. Patient will follow up with agency listed below for medication management and counseling.  Encouraged patient to maintain satisfactory support network and home environment.  Advised to adhere to medication compliance and outpatient treatment follow up.  Prescriptions provided.       Jennifer [name] motivation was an integral factor for scheduling further treatment.  Employment, transportation, bed  availability, health status, family support, and any pending legal issues were also considered during patient's hospital stay.  Upon completion of this admission the patient was both mentally and medically stable for discharge denying suicidal/homicidal ideation, auditory/visual/tactile hallucinations, delusional thoughts and paranoia.      Physical Findings: AIMS: Facial and Oral Movements Muscles of Facial Expression: None, normal Lips and Perioral Area: None, normal Jaw: None, normal Tongue: None, normal,Extremity Movements Upper (arms, wrists, hands, fingers): None, normal Lower (legs, knees, ankles, toes): None, normal, Trunk Movements Neck, shoulders, hips: None, normal, Overall Severity Severity of abnormal movements (highest score from questions above): None, normal Incapacitation due to abnormal movements: None, normal Patient's awareness of abnormal movements (rate only patient's report): No Awareness, Dental Status Current problems with teeth and/or dentures?: No Does patient usually wear dentures?: No  CIWA:  CIWA-Ar Total: 2 COWS:     Musculoskeletal: Strength & Muscle Tone: within normal limits Gait & Station: normal Patient leans: N/A  Psychiatric Specialty Exam: Physical Exam  Nursing note and vitals reviewed.   ROS  Blood pressure 104/78, pulse (!) 122, temperature 98.5 F (36.9 C), temperature source Oral, resp. rate 18, height 5\' 4"  (1.626 m), weight 66.9 kg (147 lb 8 oz), last menstrual period 10/07/2011.Body mass index is 25.32 kg/m.   Have you used any form of tobacco in the last 30 days? (Cigarettes, Smokeless Tobacco, Cigars, and/or Pipes): Yes  Has this patient used any form of tobacco in the last 30 days? (Cigarettes, Smokeless Tobacco, Cigars, and/or Pipes) Yes, N/A  Blood Alcohol level:  Lab Results  Component Value Date   ETH <5 [id number]    Metabolic Disorder Labs:  Lab Results  Component Value Date   HGBA1C 5.4 12/06/2015   MPG 108  12/06/2015   Lab Results  Component Value Date   PROLACTIN 13.7 12/06/2015   Lab Results  Component Value Date   CHOL 170 12/06/2015   TRIG 95 12/06/2015   HDL 61 12/06/2015   CHOLHDL 2.8 12/06/2015   VLDL 19 12/06/2015   LDLCALC 90 12/06/2015    See Psychiatric Specialty Exam and Suicide Risk Assessment completed by Attending Physician prior to discharge.  Discharge destination:  Home  Is patient on multiple antipsychotic therapies at discharge:  No   Has Patient had three or more failed trials of antipsychotic monotherapy by history:  No  Recommended Plan for Multiple Antipsychotic Therapies: NA     Medication List    STOP taking these medications   ABILIFY 20 MG tablet Generic drug:  ARIPiprazole   ALPRAZolam 1 MG tablet Commonly known as:  XANAX   clomiPRAMINE 50 MG capsule Commonly known as:  ANAFRANIL   diltiazem 30 MG tablet Commonly known as:  CARDIZEM   hydrOXYzine 25 MG capsule Commonly known as:  VISTARIL   naproxen sodium 220 MG tablet Commonly known as:  ANAPROX   RESTASIS 0.05 % ophthalmic emulsion Generic drug:  cycloSPORINE     TAKE these medications     Indication  asenapine 5 MG Subl 24 hr tablet Commonly known as:  SAPHRIS Place 2 tablets (10 mg total) under the tongue  at bedtime.  Indication:  mood stabilization   asenapine 5 MG Subl 24 hr tablet Commonly known as:  SAPHRIS Place 1 tablet (5 mg total) under the tongue daily. Start taking on:  12/09/2015  Indication:  mood stabilization   fluvoxaMINE 50 MG tablet Commonly known as:  LUVOX Take 1 tablet (50 mg total) by mouth daily. Start taking on:  12/09/2015 What changed:  medication strength  how much to take  when to take this  Indication:  Depression   hydrOXYzine 25 MG tablet Commonly known as:  ATARAX/VISTARIL Take 1 tablet (25 mg total) by mouth every 6 (six) hours as needed (anxiety/agitation or CIWA < or = 10).  Indication:  Anxiety Neurosis   nicotine 21  mg/24hr patch Commonly known as:  NICODERM CQ - dosed in mg/24 hours Place 1 patch (21 mg total) onto the skin daily. Start taking on:  12/09/2015  Indication:  Nicotine Addiction   traZODone 50 MG tablet Commonly known as:  DESYREL Take 1 tablet (50 mg total) by mouth at bedtime as needed for sleep.  Indication:  Trouble Sleeping      Follow-up Information    [name] Follow up.   Why:  11/30 at 10:00am with [facility] for therapy (this was the first available appointment due to the holiday; you are on a cancellation list as well if an appt comes available). 12/1 at 11:00am for medication management with [facility] information: [address] [place], [place]  [address]  Phone [phone] Fax [phone]          Follow-up recommendations:  Activity:  as tol Diet:  as tol  Comments:  1.  Take all your medications as prescribed.   2.  Report any adverse side effects to outpatient provider. 3.  Patient instructed to not use alcohol or illegal drugs while on prescription medicines. 4.  In the event of worsening symptoms, instructed patient to call 911, the crisis hotline or go to nearest emergency room for evaluation of symptoms.  Signed: Janett Labella, NP [facility] 12/08/2015, 9:34 AM   Patient seen, Suicide Assessment Completed.  Disposition Plan Reviewed

## 2015-12-08 NOTE — BHH Suicide Risk Assessment (Addendum)
[facility] Discharge Suicide Risk Assessment   Principal Problem: Mixed bipolar I disorder [facility]) Discharge Diagnoses:  Patient Active Problem List   Diagnosis Date Noted  . Mixed bipolar I disorder ([place]) [F31.60] 12/05/2015  . OCD (obsessive compulsive disorder) [F42.9] 12/04/2015  . Bipolar affective disorder ([place]) [F31.9] 12/04/2015  . Bipolar affective disorder, current episode manic without psychotic symptoms ([place]) [F31.10] 12/04/2015  . Palpitations [R00.2] 05/25/2014  . Chest pain [R07.9] 05/25/2014  . Cellulitis and abscess of buttock [L02.31, L03.317] 02/15/2013    Total Time spent with patient: 30 minutes  Musculoskeletal: Strength & Muscle Tone: within normal limits Gait & Station: normal Patient leans: N/A  Psychiatric Specialty Exam: ROS denies headache, no chest pain, no shortness of breath, no nausea, no vomiting   Blood pressure 104/78, pulse (!) 122, temperature 98.5 F (36.9 C), temperature source Oral, resp. rate 18, height 5\' 4"  (1.626 m), weight 147 lb 8 oz (66.9 kg), last menstrual period 10/07/2011.Body mass index is 25.32 kg/m.  General Appearance: Well Groomed  Eye Contact::  Good  Speech:  Normal Rate409  Volume:  Normal  Mood:  improved, states " I feel better than I have in a long time"  Affect:  Appropriate and reactive , brighter affect   Thought Process:  Linear  Orientation:  Full (Time, Place, and Person)  Thought Content:  denies hallucinations, no delusions, not internally preoccupied   Suicidal Thoughts:  No denies any suicidal ideations or any self injurious ideations   Homicidal Thoughts:  No denies any homicidal or violent ideations  Memory:  recent and remote grossly intact   Judgement:  Other:  improving   Insight:  improving   Psychomotor Activity:  Normal  Concentration:  Good  Recall:  Good  Fund of Knowledge:Good  Language: Good  Akathisia:  Negative  Handed:  Right  AIMS (if indicated):   no abnormal or involuntary movements noted    Assets:  Desire for Improvement Resilience Social Support  Sleep:  Number of Hours: 2.5  Cognition: WNL  ADL's:  Intact   Mental Status Per Nursing Assessment::   On Admission:  NA  Demographic Factors:  50 year old married female, lives with husband, Education officer, museum, currently taking time off work  Loss Factors: Job related stressors, financial difficulties   Historical Factors: History of Bipolar Disorder, has also been diagnosed with OCD in the past , history of eating disorder in the past   Risk Reduction Factors:   Sense of responsibility to family, Living with another person, especially a relative, Positive social support and Positive coping skills or problem solving skills  Continued Clinical Symptoms:  At this time patient is improved compared to admission- she presents alert, attentive, well related, mood improved and states she feels better than she has in a significant period of time, affect more reactive, brighter, no thought disorder, no suicidal ideations, no homicidal ideations, no hallucinations, no delusions, future oriented, looking forward to returning home and stating 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 At this time denies medication side effects, although reports Trazodone was not well tolerated , and did not help her insomnia.  Of note, patient has reported history of occasional " jerk" type movements at night time, states this is partly to not being able to sleep well in hospital environment and reacting to environmental lights and noises - currently not presenting with any abnormal or involuntary  movements or akathisia. Medication side effects have been reviewed, to include risk of akathisia or TD  Behavior on unit calm, in good control.  Cognitive Features That Contribute To Risk:  No gross cognitive deficits noted upon discharge. Is alert , attentive, and oriented x 3   Suicide Risk:  Mild:  Suicidal ideation of limited frequency, intensity, duration, and specificity.  There are no identifiable plans, no associated intent, mild dysphoria and related symptoms, good self-control (both objective and subjective assessment), few other risk factors, and identifiable protective factors, including available and accessible social support.  Follow-up Information    [name] Follow up.   Why:  11/30 at 10:00am with [facility] for therapy (this was the first available appointment due to the holiday; you are on a cancellation list as well if an appt comes available). 12/1 at 11:00am for medication management with [facility] information: [address] [place],   [address]  Phone [phone] Fax [phone]          Plan Of Care/Follow-up recommendations:  Activity:  as tolerated  Diet:  Regular Tests:  NA Other:  see below  Patient is requesting discharge and there are no current grounds for involuntary commitment  She is leaving unit in good spirits She is planning on returning home- states husband will pick her up later today  Plans to follow up as above and with [facility] ( PCP) for medical follow ups and treatment as needed   Neita Garnet, MD 12/08/2015, 9:28 AM

## 2015-12-08 NOTE — Progress Notes (Signed)
D: Patient's self inventory sheet: patient has fair sleep, received sleep medication without relief.good  Appetite, normal energy level, good concentration. Rated depression 0/10, hopeless 0/10, anxiety 0/10. SI/HI/AVH: Denies all, states that she has not had any hallucinations since the day she was admitted. Physical complaints are denied. Goal is "going home and getting back into my routine. Coping skills for my trigger points". Plans to work on "follow all rules and procedures".   A: Medications administered, assessed medication knowledge and education given on medication regimen.  Emotional support and encouragement given patient. R: Denies SI and HI , contracts for safety. Safety maintained with 15 minute checks.

## 2015-12-08 NOTE — BHH Group Notes (Signed)
Adult Therapy Group Note  Date: 12/08/2015  Time:  9:00-10:00AM  Group Topic/Focus: Healthy Engineer, manufacturing Esteem:   The focus of this group was to assist patients in identifying their current healthy supports and unhealthy supports, then discussing how to add additional healthy supports and reduce existing unhealthy ones.  The healthy additions included supports such as 12-step groups, individual therapy, psychiatrists, faith activities,  sponsors, group therapy, support groups, classes on mental health, and more.    Participation Level:  Active  Participation Quality:  Attentive and Sharing  Affect:  Blunted  Cognitive:  Appropriate  Insight: Good  Engagement in Group:  Engaged  Modes of Intervention:  Discussion and Support  Additional Comments:  The patient expressed that a current health support is her doctor's office at [place] as well as her EMDR therapist.  Her unhealthy support has until recently been her husband, but he is willing to understand and recently has started understanding more about her mental illness.  Other unhealthy supports include her mother-in-law and father-in-law who have been in conflict with her husband and parents about how to treat her.  Maretta Los, LCSW 12/08/2015  1:21 PM

## 2015-12-08 NOTE — Progress Notes (Signed)
Patient to discharged ambulatory to husband in lobby. All discharge paperwork given and signed, valuables returned. Prescriptions given. Patient able to verbalize understanding. Patient stable, denies SI/HI/AVH. Patient given opportunity to express concerns and ask questions.

## 2015-12-08 NOTE — Progress Notes (Signed)
  [facility] Adult Case Management Discharge Plan :  Will you be returning to the same living situation after discharge:  Yes,  with husband At discharge, do you have transportation home?: Yes,  family Do you have the ability to pay for your medications: Yes,  no issues  Release of information consent forms completed and in the chart;  Patient's signature needed at discharge.  Patient to Follow up at: Follow-up Information    [name] Follow up.   Why:  11/30 at 10:00am with [facility] for therapy (this was the first available appointment due to the holiday; you are on a cancellation list as well if an appt comes available). 12/1 at 11:00am for medication management with [facility] information: [address], [place], [place]  [address]  Phone [phone] Fax [phone]          Next level of care provider has access to [place] and Suicide Prevention discussed: Yes,  with patient and with husband  Have you used any form of tobacco in the last 30 days? (Cigarettes, Smokeless Tobacco, Cigars, and/or Pipes): Yes  Has patient been referred to the Quitline?: Yes, faxed on 12/08/15  Patient has been referred for addiction treatment: N/A  Maretta Los 12/08/2015, 2:13 PM

## 2016-06-11 LAB — BASIC METABOLIC PANEL
Creatinine: 0.9 (ref 0.5–1.1)
Glucose: 91
Potassium: 3.9 (ref 3.4–5.3)
SODIUM: 141 (ref 137–147)

## 2016-06-11 LAB — CBC AND DIFFERENTIAL: WBC: 6.5

## 2016-09-28 HISTORY — PX: ESOPHAGOGASTRODUODENOSCOPY: SHX1529

## 2017-01-14 ENCOUNTER — Other Ambulatory Visit: Payer: Self-pay | Admitting: Physician Assistant

## 2017-01-14 DIAGNOSIS — R1013 Epigastric pain: Secondary | ICD-10-CM

## 2017-01-14 DIAGNOSIS — Z86018 Personal history of other benign neoplasm: Secondary | ICD-10-CM

## 2017-01-14 DIAGNOSIS — R112 Nausea with vomiting, unspecified: Secondary | ICD-10-CM

## 2017-01-14 DIAGNOSIS — R1030 Lower abdominal pain, unspecified: Secondary | ICD-10-CM

## 2017-01-18 ENCOUNTER — Ambulatory Visit
Admission: RE | Admit: 2017-01-18 | Discharge: 2017-01-18 | Disposition: A | Discharge status: Home / Self Care | Payer: BC Managed Care – PPO | Source: Ambulatory Visit | Attending: Physician Assistant | Admitting: Physician Assistant

## 2017-01-18 DIAGNOSIS — Z86018 Personal history of other benign neoplasm: Secondary | ICD-10-CM

## 2017-01-18 DIAGNOSIS — R112 Nausea with vomiting, unspecified: Secondary | ICD-10-CM

## 2017-01-18 DIAGNOSIS — R1030 Lower abdominal pain, unspecified: Secondary | ICD-10-CM

## 2017-01-18 DIAGNOSIS — R1013 Epigastric pain: Secondary | ICD-10-CM

## 2017-01-18 MED ORDER — IOPAMIDOL (ISOVUE-300) INJECTION 61%
100.0000 mL | Freq: Once | INTRAVENOUS | Status: AC | PRN
  Administered 2017-01-18: 100 mL via INTRAVENOUS

## 2017-06-10 LAB — HM PAP SMEAR
HM Pap smear: NORMAL
HPV DNA: NEGATIVE

## 2017-09-27 DIAGNOSIS — F411 Generalized anxiety disorder: Secondary | ICD-10-CM | POA: Insufficient documentation

## 2017-10-19 ENCOUNTER — Ambulatory Visit: Payer: Self-pay | Admitting: Physician Assistant

## 2017-10-21 ENCOUNTER — Ambulatory Visit: Payer: BC Managed Care – PPO | Admitting: Psychiatry

## 2017-10-21 DIAGNOSIS — F411 Generalized anxiety disorder: Secondary | ICD-10-CM

## 2017-10-21 DIAGNOSIS — F3160 Bipolar disorder, current episode mixed, unspecified: Secondary | ICD-10-CM

## 2017-10-21 NOTE — Progress Notes (Signed)
      [facility] Counselor/Therapist Progress Note   Patient ID: Jennifer [name], MRN: [id number]  Date: 10/21/2017  Timespent: 50 minutes  Treatment Type: Individual  Subjective: Patient was present for session.  Patient was very agitated and upset during session.  She explained that she continues to have lots of issues with her husband.  Patient reported she is realizing that he is trying to keep her from interacting with her friends or do things in the evenings without him.  She realized during the day he watches her on a camera in their living area.  Patient stated she is not sure what she is going to do she is very upset and confused.  Patient was encouraged to take things one step at a time.  She was encouraged first to figure out what her options were before she made any decisions.  Patient was also encouraged to do some things that will help her prepare to leave the marriage if she needs to.  Different options of things that she could do around the house that would be helpful whether or not she stated left were discussed with patient.  Patient was encouraged to start focusing on completing those tasks to give herself more things to engage in during the day as well as movement.  Patient was also reminded of the importance of her CBT filters.  Patient identified some cognitive distortions that she was already having.  Different ways to deal with those were addressed in session and plans developed.  Interventions:CBT, Solution Focused and Supportive  Mental Status Exam:   Appearance:   Well Groomed     Behavior:  Agitated  Motor:  Restlestness  Speech/Language:   Normal Rate  Affect:  Appropriate  Mood:  anxious  Thought process:  Intact  Thought content:    Logical  Perceptual disturbances:    Normal  Orientation:  Full (Time, Place, and Person)  Attention:  Good  Concentration:  down slightly  Memory:  Immediate  Fund of knowledge:   Good  Insight:    Fair  Judgment:   Good   Impulse Control:  fair    Reported Symptoms: Panic attacks, anxiety, stomach issues including vomiting, sleep issues, and crying spells  Risk Assessment: Danger to Self:  Patient reported several times she has no intention of harming self.  She did report she is ready for different life Self-injurious Behavior: No Danger to Others: No Duty to Warn:no Physical Aggression / Violence:No  Access to Firearms a concern: No  Gang Involvement:No   Diagnosis:   ICD-10-CM   1. Generalized anxiety disorder F41.1   2. Mixed bipolar I disorder ([place]) F31.60      Plan: 1.  Patient is to practice skills discussed in session. 2.  Patient is to follow plans to engage in activities at the house during the day. 3.  Patient is to continue volunteering with the senior services. 4.  Patient is to take medications as directed. 5.  Patient is to participate in individual therapy 2-4 times per month as needed.  And follow-up with medication management appointments as needed. 6.  Patient is to go to the emergency room if she starts having suicidal ideation and does not feel she can maintain safety.  [name], [place]

## 2017-10-22 ENCOUNTER — Encounter: Payer: Self-pay | Admitting: Physician Assistant

## 2017-10-22 ENCOUNTER — Ambulatory Visit: Payer: BC Managed Care – PPO | Admitting: Physician Assistant

## 2017-10-22 DIAGNOSIS — G4700 Insomnia, unspecified: Secondary | ICD-10-CM | POA: Diagnosis not present

## 2017-10-22 DIAGNOSIS — F429 Obsessive-compulsive disorder, unspecified: Secondary | ICD-10-CM

## 2017-10-22 DIAGNOSIS — F319 Bipolar disorder, unspecified: Secondary | ICD-10-CM

## 2017-10-22 DIAGNOSIS — F411 Generalized anxiety disorder: Secondary | ICD-10-CM | POA: Diagnosis not present

## 2017-10-22 MED ORDER — HYDROXYZINE HCL 50 MG PO TABS: ORAL_TABLET | ORAL | 2 refills | Status: DC

## 2017-10-22 MED ORDER — FLUVOXAMINE MALEATE 100 MG PO TABS: ORAL_TABLET | ORAL | 1 refills | Status: DC

## 2017-10-22 MED ORDER — GABAPENTIN 600 MG PO TABS: 600.0000 mg | ORAL_TABLET | Freq: Four times a day (QID) | ORAL | 5 refills | Status: DC

## 2017-10-22 MED ORDER — CARIPRAZINE HCL 4.5 MG PO CAPS: 4.5000 mg | ORAL_CAPSULE | Freq: Every day | ORAL | 2 refills | Status: AC

## 2017-10-22 MED ORDER — CLONAZEPAM 0.5 MG PO TABS: 0.5000 mg | ORAL_TABLET | Freq: Two times a day (BID) | ORAL | 0 refills | Status: DC

## 2017-10-22 NOTE — Progress Notes (Signed)
[facility] Med Check  Patient ID: Jennifer [name],  MRN: [id number]  PCP: Lennie Odor, PA-C  Date of Evaluation: 10/22/2017 Time spent:15 minutes   HISTORY/CURRENT STATUS: HPI Dennys presents today for routine follow-up.  Since the last visit, she continues to have problems with vomiting which seems to be more linked to anxiety than anything else at this point. states she is not binging or purging frequently at all but does not give specifics.  We have increased her gabapentin which does seem to help with the anxiety somewhat.  She was having a lot of restlessness and tremor and jumpiness of her legs that was uncontrollable, since her last visit.  We decreased to the [place] which caused the symptoms to resolve. States she is still having a lot of problems with her husband.  He is very controlling and last night they had a conversation she told him she was planning to leave.  He is begging her to stay and that he will do anything possible to keep her.  He is even now saying that he will go for marriage counseling.  Patient states she does not know if she will stay she is very confused about what to do.  She is sleeping pretty well. She is not taking the Klonopin very often but does help when needed.  She had to call the on-call provider a few weeks ago to get instructions for severe panic attack.  At their advice, she increased the Klonopin to 2 pills equaling 1 mg which did help the panic attack at the time. Denies increased energy with decreased need for sleep, denies impulsivity, risky behavior, increased spending, increased libido, grandiosity. Denies specific suicidal thoughts with a plan but does state there are times when she wished she was not here to have to put up with everything that she puts up with. Individual Medical History/ Review of Systems: Changes? :No  Allergies: Codeine; Sonata [zaleplon]; Meloxicam; Other; and Penicillins  Current Medications:  Current Outpatient  Medications:  .  asenapine (SAPHRIS) 5 MG SUBL 24 hr tablet, Place 1 tablet (5 mg total) under the tongue daily., Disp: 30 tablet, Rfl: 0 .  asenapine (SAPHRIS) 5 MG SUBL 24 hr tablet, Place 2 tablets (10 mg total) under the tongue at bedtime., Disp: 60 tablet, Rfl: 0 .  Cariprazine HCl (VRAYLAR) 4.5 MG CAPS, Take 1 capsule (4.5 mg total) by mouth daily for 1 dose., Disp: 30 capsule, Rfl: 2 .  clonazePAM (KLONOPIN) 0.5 MG tablet, Take 1 tablet (0.5 mg total) by mouth 2 (two) times daily., Disp: 30 tablet, Rfl: 0 .  fluvoxaMINE (LUVOX) 100 MG tablet, Take 3 po qd, Disp: 270 tablet, Rfl: 1 .  gabapentin (NEURONTIN) 600 MG tablet, Take 1 tablet (600 mg total) by mouth 4 (four) times daily., Disp: 120 tablet, Rfl: 5 .  hydrOXYzine (ATARAX/VISTARIL) 50 MG tablet, 1-2 po q6hr prn, Disp: 90 tablet, Rfl: 2 .  nicotine (NICODERM CQ - DOSED IN MG/24 HOURS) 21 mg/24hr patch, Place 1 patch (21 mg total) onto the skin daily., Disp: 28 patch, Rfl: 0 .  traZODone (DESYREL) 50 MG tablet, Take 1 tablet (50 mg total) by mouth at bedtime as needed for sleep., Disp: 30 tablet, Rfl: 0 Medication Side Effects: Other: akathisia with high dose of Vraylar  Family Medical/ Social History: Changes? No  MENTAL HEALTH EXAM:  Last menstrual period 10/07/2011.There is no height or weight on file to calculate BMI.  General Appearance: Well Groomed  Eye Contact:  Good  Speech:  Clear and Coherent  Volume:  Normal  Mood:  Anxious  Affect:  Appropriate  Thought Process:  Coherent  Orientation:  Full (Time, Place, and Person)  Thought Content: Logical   Suicidal Thoughts:  Yes.  without intent/plan  Homicidal Thoughts:  No  Memory:  Immediate  Judgement:  Good  Insight:  Good  Psychomotor Activity:  Normal  Concentration:  Concentration: Good  Recall:  Good  Fund of Knowledge: Good  Language: Good  Akathisia:  No  AIMS (if indicated): not done  Assets:  Communication Skills  ADL's:  Intact  Cognition: WNL   Prognosis:  Good    DIAGNOSES:    ICD-10-CM   1. Bipolar I disorder ([place]) F31.9   2. Obsessive-compulsive disorder, unspecified type F42.9   3. Generalized anxiety disorder F41.1   4. Insomnia, unspecified type G47.00     RECOMMENDATIONS: Continue the current medications including the changes made between last visit and now. Vraylar is now 4.5 g daily.  Gabapentin 600 mg 1 4 times daily, or she may take 2 at a time, just not over 4 pills a day.  Continue Luvox 100 mg 3 daily, Klonopin 0.5 mg 1 or 2 p.o. twice daily as needed, hydroxyzine 50 mg 1 3 times daily as needed or may take 2 at a time if needed but not commonly.  Trazodone 50 mg 1-2 nightly as needed. Contract for safety. Continue seeing [name], [facility] for psychotherapy.  Return office visit in 1 month.    [name], PA-C

## 2017-11-04 ENCOUNTER — Ambulatory Visit: Payer: BC Managed Care – PPO | Admitting: Psychiatry

## 2017-11-11 ENCOUNTER — Telehealth: Payer: Self-pay | Admitting: Psychiatry

## 2017-11-11 ENCOUNTER — Other Ambulatory Visit: Payer: Self-pay | Admitting: Physician Assistant

## 2017-11-11 MED ORDER — TRAZODONE HCL 100 MG PO TABS: 100.0000 mg | ORAL_TABLET | Freq: Every evening | ORAL | 0 refills | Status: DC | PRN

## 2017-11-11 MED ORDER — CLONAZEPAM 0.5 MG PO TABS: 0.5000 mg | ORAL_TABLET | Freq: Two times a day (BID) | ORAL | 0 refills | Status: DC | PRN

## 2017-11-11 NOTE — Telephone Encounter (Signed)
Return patient's call.  Patient explained she was very upset because she had received a diagnosis from her physician that was very scary to her.  Patient was encouraged to think through what she can do to manage the diagnosis and to focus more on the immediate of taking care of it than anything else at this time.  Patient went on to explain it has increased her anxiety and depression.  Patient stated she was safe and did not want to go to a hospital at this time.  Patient was encouraged to consider partial hospitalization since she had extreme panic.  Patient agreed to talk with her husband about the possibility and see if finances would allow it.  Patient was given the number to call [facility] to discuss partial hospitalization program.

## 2017-11-18 ENCOUNTER — Ambulatory Visit: Payer: BC Managed Care – PPO | Admitting: Psychiatry

## 2017-11-26 ENCOUNTER — Ambulatory Visit: Payer: BC Managed Care – PPO | Admitting: Physician Assistant

## 2017-11-26 ENCOUNTER — Encounter: Payer: Self-pay | Admitting: Physician Assistant

## 2017-11-26 DIAGNOSIS — F319 Bipolar disorder, unspecified: Secondary | ICD-10-CM

## 2017-11-26 DIAGNOSIS — G4700 Insomnia, unspecified: Secondary | ICD-10-CM

## 2017-11-26 DIAGNOSIS — F411 Generalized anxiety disorder: Secondary | ICD-10-CM

## 2017-11-26 MED ORDER — BREXPIPRAZOLE 1 MG PO TABS: 1.0000 mg | ORAL_TABLET | Freq: Every day | ORAL | 1 refills | Status: DC

## 2017-11-26 MED ORDER — FLUVOXAMINE MALEATE 100 MG PO TABS: ORAL_TABLET | ORAL | 1 refills | Status: DC

## 2017-11-26 NOTE — Progress Notes (Signed)
[facility] Check  Patient ID: Jennifer [name],  MRN: [id number]  PCP: Lennie Odor, PA-C  Date of Evaluation: 11/26/2017 Time spent:25 minutes  Chief Complaint:  Chief Complaint    Follow-up    Seen along for routine follow-up.  HISTORY/CURRENT STATUS: HPI patient states that she is feeling good today.  There have been no issues that have helped up since the last visit.  She has been diagnosed with HSV-2.  She is on treatment and is feeling better after Valtrex.  She is upset with her husband because she was not told that he had this before they were married 10 years ago.  But states "it is okay I can change the past."  Biggest problem physically at this point is joint pain.  She states her right TMJ hurts all the time.  She has seen her dentist and they have given her for an lower mouth guards to wear at night but she is unable to do so because she pulls them out during the night.  She states she wakes up in her right jaw hurts really bad.  She is thinking that it is the medication that I am prescribing.  We weaned her off of the Vraylar this past week and she is now on [place].  We are not sure if there is improvement yet or not.  And I am uncertain that the medication is the cause of the problem.  She also has bilateral wrist pain bilateral elbow pain, bilateral knee pain.  I have asked her to see her PCP which she has done and had labs drawn.  The results are as below.  She denies any abnormal mouth or tongue movements.  No tics are reported.  Patient denies increased energy with decreased need for sleep, no increased talkativeness, no racing thoughts, no impulsivity or risky behaviors, no increased spending, no increased libido, no grandiosity.  Patient denies loss of interest in usual activities and is able to enjoy things.  Denies decreased energy or motivation.  Appetite has not changed.  No extreme sadness, tearfulness, or feelings of hopelessness.  Denies any changes in  concentration, making decisions or remembering things.  Denies suicidal or homicidal thoughts.  Anxiety seems to be well controlled and she is rarely having to use the Klonopin.  Individual Medical History/ Review of Systems: Changes? :Yes See above  Past medications for mental health diagnoses include: Risperdal, Seroquel, Prozac, Zoloft, Latuda, Wellbutrin, Lamictal, Depakote, Xanax, Ambien, trazodone, Trileptal, Luvox, Topamax, Elavil, Pamelor, BuSpar, doxazosin, prazosin, lithium, Vraylar, [name]  Allergies: Codeine; Sonata [zaleplon]; Meloxicam; Other; and Penicillins  Current Medications:  Current Outpatient Medications:  .  clonazePAM (KLONOPIN) 0.5 MG tablet, Take 1 tablet (0.5 mg total) by mouth 2 (two) times daily as needed for anxiety., Disp: 20 tablet, Rfl: 0 .  fluvoxaMINE (LUVOX) 100 MG tablet, Take 3 po qd, Disp: 270 tablet, Rfl: 1 .  gabapentin (NEURONTIN) 600 MG tablet, Take 1 tablet (600 mg total) by mouth 4 (four) times daily., Disp: 120 tablet, Rfl: 5 .  hydrOXYzine (ATARAX/VISTARIL) 50 MG tablet, 1-2 po q6hr prn, Disp: 90 tablet, Rfl: 2 .  traZODone (DESYREL) 100 MG tablet, Take 1 tablet (100 mg total) by mouth at bedtime as needed for sleep., Disp: 60 tablet, Rfl: 0 .  Brexpiprazole ([name]) 1 MG TABS, Take 1 tablet (1 mg total) by mouth daily., Disp: 30 tablet, Rfl: 1 .  nicotine (NICODERM CQ - DOSED IN MG/24 HOURS) 21 mg/24hr patch, Place 1 patch (21 mg  total) onto the skin daily., Disp: 28 patch, Rfl: 0 Medication Side Effects: joint pain  Family Medical/ Social History: Changes? No  MENTAL HEALTH EXAM:  Last menstrual period 10/07/2011.There is no height or weight on file to calculate BMI.  General Appearance: Well Groomed  Eye Contact:  Good  Speech:  Clear and Coherent  Volume:  Normal  Mood:  Euthymic  Affect:  Appropriate  Thought Process:  Goal Directed  Orientation:  Full (Time, Place, and Person)  Thought Content: Logical   Suicidal Thoughts:  No   Homicidal Thoughts:  No  Memory:  WNL  Judgement:  Good  Insight:  Good  Psychomotor Activity:  Normal  Concentration:  Concentration: Good  Recall:  Good  Fund of Knowledge: Good  Language: Good  Assets:  Desire for Improvement  ADL's:  Intact  Cognition: WNL  Prognosis:  Good  Labs 10/11/2017 CMP is within normal limits TSH is normal at 1.99  DIAGNOSES:    ICD-10-CM   1. Bipolar I disorder ([place]) F31.9   2. Insomnia, unspecified type G47.00   3. Generalized anxiety disorder F41.1     Receiving Psychotherapy: Yes With [name], [facility]   RECOMMENDATIONS: I spent 25 minutes with her and 50% of that time was spent discussing her medications and possible side effects.   We will continue [name] 1 mg daily, along with all other above medications. Continue using the mouthguard.  Use an anti-inflammatory as needed.  She knows to contact me if she starts having any abnormal muscle movements, rigidity, tremor.  In the meantime, will wait and hope that her symptoms improve with discontinuing the Vraylar. Continue psychotherapy with [name], LPC. Return in 2 weeks or sooner as needed.   [name], PA-C

## 2017-12-01 ENCOUNTER — Ambulatory Visit: Payer: BC Managed Care – PPO | Admitting: Psychiatry

## 2017-12-01 DIAGNOSIS — F411 Generalized anxiety disorder: Secondary | ICD-10-CM

## 2017-12-01 NOTE — Progress Notes (Signed)
      [facility] Counselor/Therapist Progress Note   Patient ID: Jennifer [name], MRN: [id number]  Date: 12/01/2017  Timespent: 48 minutes   Treatment Type: Individual   Reported Symptoms: Panic attacks, Sleep disturbance, Fatigue, Physical aches and pain and anxiety   Mental Status Exam:    Appearance:   Well Groomed     Behavior:  Appropriate  Motor:  Normal  Speech/Language:   Normal Rate  Affect:  Appropriate  Mood:  anxious  Thought process:  circumstantial  Thought content:    WNL  Sensory/Perceptual disturbances:    pain  Orientation:  oriented to person, place and time/date  Attention:  Good  Concentration:  Good  Memory:  Immediate;   Poor  Fund of knowledge:   Good  Insight:    Good  Judgment:   Fair  Impulse Control:  Fair     Risk Assessment: Danger to Self:  No Self-injurious Behavior: No Danger to Others: No Duty to Warn:no Physical Aggression / Violence:No  Access to Firearms a concern: No  Gang Involvement:No    Subjective: Patient was present for session.  Patient reported she was not feeling well and has had lots of different pain issues recently.  Patient explained she  had to miss appointments due to sickness and she is continuing to feel miserable.  Patient explained that much of that has to do with the issue created by her husband.  Patient explained since that time she has accepted the relationship is going to be more like roommates and she does not have to get so agitated over the things he says and does.  Patient was encouraged to continue that perspective and realize she cannot change him so she has to figure out how to make their situation functional.  Patient went on to share that she is still going to the senior center and that has been positive for her mood.  She is hoping that when she is healthy she will be able to join the gym that just up and down the street.  Patient went on to say she she is trying to put the right stuff in her  brain daily but recognizing she needs to get back to her CBT workbook.  Patient was encouraged to remember regularly that her perspective/CBT filters makes a huge difference in her mood so she has to work on putting in the right information.  Ways to help her do that on a daily basis were discussed with patient.   Interventions: Cognitive Behavioral Therapy and Solution-Oriented/Positive Psychology   Diagnosis:   ICD-10-CM   1. Generalized anxiety disorder F41.1      Plan: 1.  Patient to continue to engage in individual counseling 2-4 times a month or as needed. 2.  Patient to identify and apply CBT, coping skills learned in session to decrease  anxiety symptoms. 3.  Patient to contact this office, go to the local ED or call 911 if a crisis or emergency develops between visits.   [name], [place]

## 2017-12-06 ENCOUNTER — Telehealth: Payer: Self-pay | Admitting: Physician Assistant

## 2017-12-06 ENCOUNTER — Other Ambulatory Visit: Payer: Self-pay | Admitting: Physician Assistant

## 2017-12-06 NOTE — Telephone Encounter (Signed)
Please call pt and let her know I've discussed her case with Dr. Clovis Pu and he doesn't think the joint pain is coming from the meds.  Continue the Ibuprofen and will see what the labs show and I'll see her Wed.

## 2017-12-06 NOTE — Telephone Encounter (Signed)
Pt called and wanted you to know that she still doesn't feel any better. Talked to the dr. On call over the weekend. She will see you on wednesday

## 2017-12-07 LAB — CBC WITH DIFFERENTIAL/PLATELET
BASOS PCT: 0.9 %
Basophils Absolute: 60 cells/uL (ref 0–200)
Eosinophils Absolute: 221 cells/uL (ref 15–500)
Eosinophils Relative: 3.3 %
HCT: 38.6 % (ref 35.0–45.0)
HEMOGLOBIN: 13.3 g/dL (ref 11.7–15.5)
Lymphs Abs: 2157 cells/uL (ref 850–3900)
MCH: 29.8 pg (ref 27.0–33.0)
MCHC: 34.5 g/dL (ref 32.0–36.0)
MCV: 86.5 fL (ref 80.0–100.0)
MONOS PCT: 9.3 %
MPV: 11.7 fL (ref 7.5–12.5)
NEUTROS ABS: 3638 {cells}/uL (ref 1500–7800)
Neutrophils Relative %: 54.3 %
PLATELETS: 204 10*3/uL (ref 140–400)
RBC: 4.46 10*6/uL (ref 3.80–5.10)
RDW: 13 % (ref 11.0–15.0)
Total Lymphocyte: 32.2 %
WBC mixed population: 623 cells/uL (ref 200–950)
WBC: 6.7 10*3/uL (ref 3.8–10.8)

## 2017-12-07 LAB — COMPLETE METABOLIC PANEL WITH GFR
AG Ratio: 2 (calc) (ref 1.0–2.5)
ALBUMIN MSPROF: 4.3 g/dL (ref 3.6–5.1)
ALT: 48 U/L — ABNORMAL HIGH (ref 6–29)
AST: 39 U/L — ABNORMAL HIGH (ref 10–35)
Alkaline phosphatase (APISO): 73 U/L (ref 33–130)
BILIRUBIN TOTAL: 0.3 mg/dL (ref 0.2–1.2)
BUN: 14 mg/dL (ref 7–25)
CALCIUM: 9.4 mg/dL (ref 8.6–10.4)
CO2: 27 mmol/L (ref 20–32)
Chloride: 103 mmol/L (ref 98–110)
Creat: 0.76 mg/dL (ref 0.50–1.05)
GFR, EST AFRICAN AMERICAN: 105 mL/min/{1.73_m2} (ref >=60)
GFR, EST NON AFRICAN AMERICAN: 90 mL/min/{1.73_m2} (ref >=60)
GLUCOSE: 95 mg/dL (ref 65–99)
Globulin: 2.2 g/dL (calc) (ref 1.9–3.7)
Potassium: 4.4 mmol/L (ref 3.5–5.3)
Sodium: 137 mmol/L (ref 135–146)
TOTAL PROTEIN: 6.5 g/dL (ref 6.1–8.1)

## 2017-12-07 LAB — LACTATE DEHYDROGENASE: LDH: 212 U/L (ref 120–250)

## 2017-12-07 LAB — CK: Total CK: 91 U/L (ref 29–143)

## 2017-12-07 NOTE — Telephone Encounter (Signed)
Pt acknowledged information and will discuss at appt.

## 2017-12-08 ENCOUNTER — Ambulatory Visit: Payer: BC Managed Care – PPO | Admitting: Physician Assistant

## 2017-12-08 ENCOUNTER — Other Ambulatory Visit: Payer: Self-pay | Admitting: Physician Assistant

## 2017-12-08 MED ORDER — BREXPIPRAZOLE 2 MG PO TABS: 2.0000 mg | ORAL_TABLET | Freq: Every day | ORAL | 1 refills | Status: DC

## 2017-12-08 MED ORDER — BREXPIPRAZOLE 2 MG PO TABS: 30.0000 mg | ORAL_TABLET | Freq: Every day | ORAL | 1 refills | Status: DC

## 2017-12-14 ENCOUNTER — Encounter: Payer: Self-pay | Admitting: Psychiatry

## 2017-12-14 ENCOUNTER — Ambulatory Visit: Payer: BC Managed Care – PPO | Admitting: Psychiatry

## 2017-12-14 DIAGNOSIS — F411 Generalized anxiety disorder: Secondary | ICD-10-CM | POA: Diagnosis not present

## 2017-12-14 DIAGNOSIS — F3132 Bipolar disorder, current episode depressed, moderate: Secondary | ICD-10-CM

## 2017-12-14 NOTE — Progress Notes (Signed)
      [facility] Counselor/Therapist Progress Note  Patient ID: Jennifer [name], MRN: [id number],    Date: 12/14/2017  Time Spent: 49 minutes   Treatment Type: Individual Therapy  Reported Symptoms: Depressed mood, Anxious Mood and Somatic complaints  Mental Status Exam:  Appearance:   Well Groomed     Behavior:  Appropriate  Motor:  Normal  Speech/Language:   Normal Rate  Affect:  Congruent  Mood:  normal  Thought process:  normal  Thought content:    WNL  Sensory/Perceptual disturbances:    WNL  Orientation:  oriented to person, place and time/date  Attention:  Good  Concentration:  Good  Memory:  Immediate;   Poor  Fund of knowledge:   Good  Insight:    Good  Judgment:   Good  Impulse Control:  Good   Risk Assessment: Danger to Self:  No Self-injurious Behavior: No Danger to Others: No Duty to Warn:no Physical Aggression / Violence:No  Access to Firearms a concern: No  Gang Involvement:No   Subjective: Patient was present for session.  She reported she felt the medication was helping some but she had concerns about the future co-pays.  She reported she was working on that with the front staff.  Patient also shared that she has been trying to work on her thoughts and has new workbook that is trying to help her.  Patient was reminded that negative thoughts lead to negative chemistry in the brain and positive thoughts lead to positive brain chemistry so it is important she focuses on the positive thoughts.  Patient shared she is been having some dreams that were disturbing to her.  She wanted to work on them with E MDR.  Patient chose the picture of [place] Cerise being in her dream, suds level 8- cognition "I am not good enough" she felt hurt in her stomach.  Patient was able to reduce the level of disturbance to 2.  She acknowledged that she and he just did not need to be together and that she does not need to feel bad about that.  The importance of her continuing to work on the right  messages and trying to have more interaction with others were discussed with patient.  She agreed to try and follow through with plans.  Patient was given information about [facility] treatment facility to research and consider if needed in the future.  Interventions: Solution-Oriented/Positive Psychology,EMDR  Diagnosis:   ICD-10-CM   1. Generalized anxiety disorder F41.1   2. Bipolar affective disorder, currently depressed, moderate ([place]) F31.32     Plan: 1.  Patient to continue to engage in individual counseling 2-4 times a month or as needed. 2.  Patient to identify and apply CBT, coping skills learned in session to decrease depression and anxiety symptoms. 3.  Patient to contact this office, go to the local ED or call 911 if a crisis or emergency develops between visits.  [name], [place]

## 2017-12-29 ENCOUNTER — Telehealth: Payer: Self-pay | Admitting: Psychiatry

## 2017-12-29 ENCOUNTER — Ambulatory Visit: Payer: BC Managed Care – PPO | Admitting: Psychiatry

## 2017-12-29 NOTE — Telephone Encounter (Signed)
Patient called upset.  She shared that there have been several little incidents that have gotten her feeling overwhelmed.  Did some positive self talk and grounding exercises with patient.  Patient was able to remind herself of the truth and that she is okay.  She reported feeling calmer at the end of conversation and that she would be okay.  Her husband was calling so she had to get off the phone and talk with him.  She agreed to try and make sure she attends next session.

## 2018-01-10 ENCOUNTER — Telehealth: Payer: Self-pay | Admitting: Physician Assistant

## 2018-01-10 NOTE — Telephone Encounter (Signed)
Given information, she's going to add melatonin back in, it's been awhile since she's tried it. Says her OV is 01/31/2017. Verbalized understanding.

## 2018-01-10 NOTE — Telephone Encounter (Signed)
Pt. Called and said that she has been taking 400mg  of the trazadone. Not sleeping but two or three hours a night. Pt. Is having hallucintaions, seeing two dogs when she only has one dog. Pt is also falling down a lot. Doesn't know if it is from the rexalti

## 2018-01-10 NOTE — Telephone Encounter (Signed)
She should only take up to 200 mg of Trazodone. Is she doing Melatonin?  Add it if not. [name] might be keeping her up. Decrease [name] back to 1mg  for 4 days and then stop.  When is her next appt with me?  If not within the next month, need to make one asap.

## 2018-01-14 ENCOUNTER — Ambulatory Visit: Payer: BC Managed Care – PPO | Admitting: Psychiatry

## 2018-01-19 ENCOUNTER — Telehealth: Payer: Self-pay | Admitting: Psychiatry

## 2018-01-19 MED ORDER — PROPRANOLOL HCL 10 MG PO TABS: 10.0000 mg | ORAL_TABLET | Freq: Two times a day (BID) | ORAL | 0 refills | Status: DC | PRN

## 2018-01-19 MED ORDER — MIRTAZAPINE 15 MG PO TABS: ORAL_TABLET | ORAL | 0 refills | Status: DC

## 2018-01-19 NOTE — Telephone Encounter (Signed)
Pt reports that she has not been able to sleep for over 2 weeks and that she has slept only 2-3 hours the last several nights. She reports taking two Klonopin 0.5 mg tabs and sleeping only 3 hours. She reports that her step-father has been in ICU and is being tx'd to hospice. She reports racing thoughts, severe anxiety, HA's, n/v and "dry heeves." She reports that her legs have been twitching and she has been severely restless and she must be "moving all the time." Reports that these s/s have been unrelieved with Trazodone, Hydroxyzine, or Gabapentin 1200 mg. Pt reports that she has experienced VH. She denies SI.   Discussed that she may be experiencing akathisia secondary to [place], which she reports stopping about a week ago. Discussed that Propranolol may help relieve akathisia. Advised her to stop Propranolol if she feels dizzy or light-headed. Discussed that Remeron may be helpful for insomnia, anxiety, and n/v. Discussed that it can cause weight gain long-term. Pt agrees to trial of Remeron short-term to possibly help stabilize acute s/s. Advised her not to take Trazodone when taking Remeron. Scripts sent to [facility] in [place] since pt reports that her regular pharmacy is closed.

## 2018-01-20 ENCOUNTER — Other Ambulatory Visit: Payer: Self-pay | Admitting: Physician Assistant

## 2018-01-20 ENCOUNTER — Other Ambulatory Visit: Payer: Self-pay

## 2018-01-20 ENCOUNTER — Telehealth: Payer: Self-pay | Admitting: Physician Assistant

## 2018-01-20 MED ORDER — OLANZAPINE 5 MG PO TABS: 5.0000 mg | ORAL_TABLET | Freq: Every day | ORAL | 1 refills | Status: DC

## 2018-01-20 NOTE — Telephone Encounter (Signed)
Let's add Zyprexa.  I pend'ed it so please send after confirming her pharmacy.Yes on the Remeron.  Tell her to try not to read any side effects of any of these medicines because they all have them.

## 2018-01-20 NOTE — Telephone Encounter (Signed)
Pt appears to be hyperverbal on the phone. Was afraid to take Remeron because side effect is dry eyes, and she already has issues with her eyes being dry. Encouraged her to try tonight. Pt also asking if she should take something in place of [name], not sure she can wait till office visit on 01/13.   Please advise.

## 2018-01-20 NOTE — Telephone Encounter (Signed)
See other phone message  

## 2018-01-20 NOTE — Telephone Encounter (Signed)
Jennifer [name] has talked with her already.

## 2018-01-20 NOTE — Telephone Encounter (Signed)
Pt aware and rx sent to her pharmacy.

## 2018-01-20 NOTE — Telephone Encounter (Signed)
PATIENT HAS ENOUGH REXALTI UNTIL VISIT WITH TERESA, DOES NOT WANT TO TAKE OTHER MEDICATION, WANTS TO STAY ON REXALTI

## 2018-01-20 NOTE — Telephone Encounter (Signed)
FYI

## 2018-01-20 NOTE — Telephone Encounter (Signed)
Ok thanks [place].  Traci please let pt know to continue all the above and she and I will discuss further at [place] on 1/13. And of course call if has problems before then.

## 2018-01-20 NOTE — Telephone Encounter (Signed)
Jennifer [name] called the on call Dr. Wilburn Mylar with restless leg issues, but she reports that mood is not stabilized and she thinks perhaps she needs to be on a bipolar med.  Please call to discuss.  [phone].  Has appt 01/31/18.  She does also have an appt w/ [facility] tomorrow.

## 2018-01-21 ENCOUNTER — Ambulatory Visit: Payer: BC Managed Care – PPO | Admitting: Psychiatry

## 2018-01-21 ENCOUNTER — Encounter: Payer: Self-pay | Admitting: Psychiatry

## 2018-01-21 DIAGNOSIS — F3160 Bipolar disorder, current episode mixed, unspecified: Secondary | ICD-10-CM | POA: Diagnosis not present

## 2018-01-21 DIAGNOSIS — F411 Generalized anxiety disorder: Secondary | ICD-10-CM

## 2018-01-21 NOTE — Progress Notes (Signed)
[facility] Counselor/Therapist Progress Note  Patient ID: Jennifer [name], MRN: [id number],    Date: 01/21/2018  Time Spent: 51 minutes   Treatment Type: Individual Therapy  Reported Symptoms: Depressed mood, Anxious Mood, Sleep disturbance and Fatigue, focusing issues, pain issues, vomiting, racing thoughts, panic attacks, hallucinations  Mental Status Exam:  Appearance:   Casual     Behavior:  Sharing  Motor:  Normal  Speech/Language:   Normal Rate  Affect:  Congruent  Mood:  anxious  Thought process:  circumstantial   Thought content:    WNL  Sensory/Perceptual disturbances:    WNL but reported having some hallucinations over the week none current  Orientation:  oriented to person, place and time/date  Attention:  Good  Concentration:  Good  Memory:  WNL  Fund of knowledge:   Good  Insight:    Fair  Judgment:   Fair  Impulse Control:  Good   Risk Assessment: Danger to Self:  No Self-injurious Behavior: No Danger to Others: No Duty to Warn:no Physical Aggression / Violence:No  Access to Firearms a concern: No  Gang Involvement:No   Subjective: Patient was present for session.  Patient reported that things have been very difficult for her recently.  She shared that she has been unable to sleep for about a month and is going through some medication changes.  Patient explains she started having some visual hallucinations but is not having any currently and feels that they are connected to not sleeping.  She went on to share that she started back on her [name] and feels that it has slowed down her thoughts so they are no longer racing and she is hopeful that she will be getting more sleep.  Last night she would sleep an hour at a time and then wake up.  Discussed the importance of working on her thoughts prior to sleep.  Reminded patient the importance of getting on a regular schedule working and exercise and movement to help her body be more tired.  Continuing to work on  taking her medication as directed as well as repeating her positive affirmations regularly.  Patient was also encouraged to stay focused on putting in her spiritual affirmations since that seems to help her the most.  Patient was reminded that if things do not get better she could still go to the residential treatment facility discussed with her.  Patient stated at this point her husband is saying it is not an option for them but agreed that she will pursue it more if things were to get worse.  Patient also reported her stepfather is dying and she realizes that the stress from that has probably impacted her sleep as well.  Discussed the importance of making sure she tells him everything she needs to prior to his death.  Patient reported she is having positive time with him even though he can get very irritable quickly.  Patient was encouraged to remind herself that the irritability is coming from the pain and not to take it personal even though that is very difficult for her.  Interventions: Cognitive Behavioral Therapy and Solution-Oriented/Positive Psychology  Diagnosis:   ICD-10-CM   1. Generalized anxiety disorder F41.1   2. Mixed bipolar I disorder ([place]) F31.60     Plan: 1.  Patient to continue to engage in individual counseling 2-4 times a month or as needed. 2.  Patient to identify and apply CBT, coping skills learned in session to decrease depression and anxiety  symptoms. 3.  Patient to contact this office, go to the local ED or call 911 if a crisis or emergency develops between visits.  [name], [place]

## 2018-01-27 ENCOUNTER — Ambulatory Visit: Payer: BC Managed Care – PPO | Admitting: Psychiatry

## 2018-01-31 ENCOUNTER — Ambulatory Visit: Payer: BC Managed Care – PPO | Admitting: Physician Assistant

## 2018-01-31 ENCOUNTER — Encounter: Payer: Self-pay | Admitting: Physician Assistant

## 2018-01-31 DIAGNOSIS — F3160 Bipolar disorder, current episode mixed, unspecified: Secondary | ICD-10-CM | POA: Diagnosis not present

## 2018-01-31 DIAGNOSIS — F429 Obsessive-compulsive disorder, unspecified: Secondary | ICD-10-CM

## 2018-01-31 DIAGNOSIS — F411 Generalized anxiety disorder: Secondary | ICD-10-CM | POA: Diagnosis not present

## 2018-01-31 MED ORDER — BREXPIPRAZOLE 1 MG PO TABS: 1.0000 mg | ORAL_TABLET | Freq: Every day | ORAL | 2 refills | Status: DC

## 2018-01-31 MED ORDER — CLONAZEPAM 0.5 MG PO TABS: 0.5000 mg | ORAL_TABLET | Freq: Two times a day (BID) | ORAL | 1 refills | Status: DC | PRN

## 2018-01-31 NOTE — Progress Notes (Signed)
[facility] Med Check  Patient ID: Jennifer [name],  MRN: [id number]  PCP: Lennie Odor, PA-C  Date of Evaluation: 02/01/2018 Time spent:15 minutes  Chief Complaint:  Chief Complaint    Medication Refill      HISTORY/CURRENT STATUS: HPI  For routine med check.  Her step-father died last week.  She's been sick with flu, sinusitis, bronchitis, strep.  She's had diarrhea and had a fever last night, and vomited last night. Better now. "I don't know why I keep getting sick."   It's hard to know if she's better, the same, or worse.  But she did have akathisia sx since LOV.  She was advised to change to a different atypical antipsychotic but she did not want to because she is done well on the current 1.  The dose was decreased and she does not have that feeling of wanting to crawl out of her skin.  She has been sewing some which is a good sign that she is enjoying things.  She is unsure if energy and motivation are good or not because she is been so sick and has not felt like doing anything.  Her stepdad died last week.  It has been very hard for her but states he had been suffering and it was time for him to go.  Individual Medical History/ Review of Systems: Changes? :Yes  see above.   Past medications for mental health diagnoses include: Risperdal, Seroquel, Prozac, Zoloft, Latuda, Wellbutrin, Lamictal, Depakote, Xanax, Ambien, trazodone, Trileptal, Luvox, Topamax, Elavil, Pamelor, BuSpar, doxazosin, prazosin, lithium, Vraylar, [name] Allergies: Codeine; Sonata [zaleplon]; Meloxicam; Other; and Penicillins  Current Medications:  Current Outpatient Medications:  .  clonazePAM (KLONOPIN) 0.5 MG tablet, Take 1 tablet (0.5 mg total) by mouth 2 (two) times daily as needed for anxiety., Disp: 20 tablet, Rfl: 1 .  fluvoxaMINE (LUVOX) 100 MG tablet, Take 3 po qd, Disp: 270 tablet, Rfl: 1 .  gabapentin (NEURONTIN) 600 MG tablet, Take 1 tablet (600 mg total) by mouth 4 (four) times daily.,  Disp: 120 tablet, Rfl: 5 .  hydrOXYzine (ATARAX/VISTARIL) 50 MG tablet, 1-2 po q6hr prn, Disp: 90 tablet, Rfl: 2 .  traZODone (DESYREL) 100 MG tablet, Take 1 tablet (100 mg total) by mouth at bedtime as needed for sleep., Disp: 60 tablet, Rfl: 0 .  Brexpiprazole ([name]) 1 MG TABS, Take 1 tablet (1 mg total) by mouth daily., Disp: 30 tablet, Rfl: 2 .  nicotine (NICODERM CQ - DOSED IN MG/24 HOURS) 21 mg/24hr patch, Place 1 patch (21 mg total) onto the skin daily., Disp: 28 patch, Rfl: 0 Medication Side Effects: none  Family Medical/ Social History: Changes? Yes step dad died.   MENTAL HEALTH EXAM:  Last menstrual period 10/07/2011.There is no height or weight on file to calculate BMI.  General Appearance: Casual and Well Groomed  Eye Contact:  Good  Speech:  Clear and Coherent  Volume:  Normal  Mood:  Euthymic  Affect:  Appropriate  Thought Process:  Goal Directed  Orientation:  Full (Time, Place, and Person)  Thought Content: Logical   Suicidal Thoughts:  No  Homicidal Thoughts:  No  Memory:  WNL  Judgement:  Good  Insight:  Good  Psychomotor Activity:  Normal  Concentration:  Concentration: Good  Recall:  Good  Fund of Knowledge: Good  Language: Good  Assets:  Desire for Improvement  ADL's:  Intact  Cognition: WNL  Prognosis:  Good    DIAGNOSES:    ICD-10-CM   1. Mixed  bipolar I disorder ([place]) F31.60   2. Obsessive-compulsive disorder, unspecified type F42.9   3. Generalized anxiety disorder F41.1     Receiving Psychotherapy: Yes With [name], LPC.  RECOMMENDATIONS: Continue [name] 1 mg p.o. daily. Continue Klonopin 0.5 mg twice daily as needed. Continue Luvox 300 mg nightly. Continue Neurontin 600 mg 4 times daily. Continue trazodone 100 mg nightly as needed sleep Continue psychotherapy with [name], LPC  return in 4 weeks or sooner as needed.  [name], PA-C

## 2018-02-03 ENCOUNTER — Telehealth: Payer: Self-pay | Admitting: Psychiatry

## 2018-02-03 NOTE — Telephone Encounter (Signed)
Returned patient's call.  She reported that she was having a hard time due to sickness and the death of her stepfather.  She stated that she was trying to diet but it hasn't been going well.  Patient was encouraged to think about what she needs to be telling herself to get through this time.  She reported she was able to get worked in for an appointment with Clinician next week.

## 2018-02-07 ENCOUNTER — Encounter: Payer: Self-pay | Admitting: Psychiatry

## 2018-02-07 ENCOUNTER — Ambulatory Visit: Payer: BC Managed Care – PPO | Admitting: Psychiatry

## 2018-02-07 DIAGNOSIS — F411 Generalized anxiety disorder: Secondary | ICD-10-CM | POA: Diagnosis not present

## 2018-02-07 DIAGNOSIS — F3160 Bipolar disorder, current episode mixed, unspecified: Secondary | ICD-10-CM | POA: Diagnosis not present

## 2018-02-07 NOTE — Progress Notes (Signed)
      [facility] Counselor/Therapist Progress Note  Patient ID: Jennifer [name], MRN: [id number],    Date: 02/07/2018  Time Spent: 51 minutes  Treatment Type: Individual Therapy  Reported Symptoms: Depressed mood, Anxious Mood, Panic Attacks, Ruminations, Sleep disturbance and Fatigue  Mental Status Exam:  Appearance:   Well Groomed     Behavior:  Appropriate  Motor:  Normal  Speech/Language:   Normal Rate  Affect:  Congruent  Mood:  normal  Thought process:  circumstantial  Thought content:    WNL  Sensory/Perceptual disturbances:    WNL  Orientation:  oriented to person, place and time/date  Attention:  Good  Concentration:  Good  Memory:  Immediate;   [place] of knowledge:   Good  Insight:    Fair  Judgment:   Good  Impulse Control:  Good   Risk Assessment: Danger to Self:  No Self-injurious Behavior: No Danger to Others: No Duty to Warn:no Physical Aggression / Violence:No  Access to Firearms a concern: No  Gang Involvement:No   Subjective: Patient was present for session.  She reported she was doing much better currently.  She shared that she had strep throat, the flu, bronchitis, and a sinus infection all at the same time during her stepfather's viewing and funeral.  Patient explained that it left a lot of her feelings and frustrations because she could not get going and make it to all the advance.  Shared that her husband seemed to have the most difficulty with that which left her very frustrated.  Patient stated she was finally able to confront him and that helped her to feel some better.  Patient also explained that she goes back and forth on what she wants to do with the relationship on a regular basis.  Patient shared that she was very depressed during that time  and consider going to a treatment facility again.  She stated that at this time she felt safe and that she did not need to go anywhere.  Patient went on to explain she feels that he is realizing she has  to get out of the house and spend more time with friends, so that is helping.  Patient went on to share that she is having some conflicts with her mother that are bothering her.  Different ways to talk herself through those interactions were addressed with patient as well.  Patient was encouraged to stay focused on her self-care through diet exercise and her self talk.  She was reminded that she does best when she does not sit at home but gets out and interacts with others.  Patient is hopeful that as she gets healthier she will be able to do that again.  Patient also agreed to continue working on her journaling and her positive affirmations on a daily basis.  Interventions: Solution-Oriented/Positive Psychology  Diagnosis:   ICD-10-CM   1. Generalized anxiety disorder F41.1   2. Mixed bipolar I disorder ([place]) F31.60     Plan: 1.  Patient to continue to engage in individual counseling 2-4 times a month or as needed. 2.  Patient to identify and apply CBT, coping skills learned in session to decrease depression and anxiety symptoms. 3.  Patient to contact this office, go to the local ED or call 911 if a crisis or emergency develops between visits.  [name], [place]

## 2018-02-10 ENCOUNTER — Ambulatory Visit: Payer: BC Managed Care – PPO | Admitting: Psychiatry

## 2018-02-24 ENCOUNTER — Ambulatory Visit: Payer: BC Managed Care – PPO | Admitting: Psychiatry

## 2018-03-02 ENCOUNTER — Ambulatory Visit: Payer: BC Managed Care – PPO | Admitting: Physician Assistant

## 2018-03-02 ENCOUNTER — Encounter: Payer: Self-pay | Admitting: Physician Assistant

## 2018-03-02 DIAGNOSIS — F3160 Bipolar disorder, current episode mixed, unspecified: Secondary | ICD-10-CM

## 2018-03-02 DIAGNOSIS — G4700 Insomnia, unspecified: Secondary | ICD-10-CM

## 2018-03-02 DIAGNOSIS — F411 Generalized anxiety disorder: Secondary | ICD-10-CM | POA: Diagnosis not present

## 2018-03-02 MED ORDER — HYDROXYZINE HCL 50 MG PO TABS: ORAL_TABLET | ORAL | 5 refills | Status: DC

## 2018-03-02 MED ORDER — TRAZODONE HCL 100 MG PO TABS: 100.0000 mg | ORAL_TABLET | Freq: Every evening | ORAL | 5 refills | Status: DC | PRN

## 2018-03-02 MED ORDER — BREXPIPRAZOLE 1 MG PO TABS: 1.0000 mg | ORAL_TABLET | Freq: Every day | ORAL | 5 refills | Status: DC

## 2018-03-02 NOTE — Progress Notes (Signed)
[facility]  Patient ID: Jennifer [name],  MRN: [id number]  PCP: Lennie Odor, PA-C  Date of Evaluation: 03/02/2018 Time spent:15 minutes  Chief Complaint:  Chief Complaint    Follow-up      HISTORY/CURRENT STATUS: HPI Here for 1 month med check.   "I'm having a good day today.  But yesterday wasn't a good day. I have days I cry all day.  But I think the meds are doing pretty good I guess.  I have more good days than bad right now.  I'm real nervous b/c my disability hearing is next week.  I am afraid I will start throwing up if I get really nervous during the hearing.  I have warned my lawyer about that already."    She is able to enjoy some things including her sewing.  She will sometimes go a couple of days or even a week without doing it when she is having a bad day.  Like yesterday she did not want to get out of bed, she did not shower and wanted to be by herself.  Patient denies increased energy with decreased need for sleep, does have talkativeness, no racing thoughts, no impulsivity or risky behaviors, no increased spending, no increased libido, no grandiosity.  Every few days she has been binging more but no purging in the past few days.  No laxative use.  Denies muscle or joint pain, stiffness, or dystonia.  Denies dizziness, syncope, seizures, numbness, tingling, tremor, tics, unsteady gait, slurred speech, confusion.   Individual Medical History/ Review of Systems: Changes? :No  except has had more migraines in the past month.   Past medications for mental health diagnoses include: Risperdal, Seroquel, Prozac, Zoloft, Latuda, Wellbutrin, Lamictal, Depakote, Xanax, Ambien, trazodone, Trileptal, Luvox, Topamax, Elavil, Pamelor, BuSpar, doxazosin, prazosin, lithium, Vraylar, [name] Allergies: Codeine; Sonata [zaleplon]; Meloxicam; Other; and Penicillins  Allergies: Codeine; Sonata [zaleplon]; Meloxicam; Other; and Penicillins  Current Medications:   Current Outpatient Medications:  .  Brexpiprazole ([name]) 1 MG TABS, Take 1 tablet (1 mg total) by mouth daily., Disp: 30 tablet, Rfl: 5 .  clonazePAM (KLONOPIN) 0.5 MG tablet, Take 1 tablet (0.5 mg total) by mouth 2 (two) times daily as needed for anxiety., Disp: 20 tablet, Rfl: 1 .  fluvoxaMINE (LUVOX) 100 MG tablet, Take 3 po qd, Disp: 270 tablet, Rfl: 1 .  gabapentin (NEURONTIN) 600 MG tablet, Take 1 tablet (600 mg total) by mouth 4 (four) times daily., Disp: 120 tablet, Rfl: 5 .  hydrOXYzine (ATARAX/VISTARIL) 50 MG tablet, 1-2 po q6hr prn, Disp: 90 tablet, Rfl: 5 .  traZODone (DESYREL) 100 MG tablet, Take 1-2 tablets (100-200 mg total) by mouth at bedtime as needed for sleep., Disp: 60 tablet, Rfl: 5 .  nicotine (NICODERM CQ - DOSED IN MG/24 HOURS) 21 mg/24hr patch, Place 1 patch (21 mg total) onto the skin daily., Disp: 28 patch, Rfl: 0 Medication Side Effects: none  Family Medical/ Social History: Changes? No  MENTAL HEALTH EXAM:  Last menstrual period 10/07/2011.There is no height or weight on file to calculate BMI.  General Appearance: Casual and Well Groomed  Eye Contact:  Good  Speech:  Clear and Coherent and Talkative  Volume:  Normal  Mood:  Euthymic  Affect:  Appropriate  Thought Process:  Goal Directed  Orientation:  Full (Time, Place, and Person)  Thought Content: Logical   Suicidal Thoughts:  No  Homicidal Thoughts:  No  Memory:  WNL  Judgement:  Good  Insight:  Good  Psychomotor Activity:  Normal  Concentration:  Concentration: Good  Recall:  Good  Fund of Knowledge: Good  Language: Good  Assets:  Desire for Improvement  ADL's:  Intact  Cognition: WNL  Prognosis:  Good    DIAGNOSES:    ICD-10-CM   1. Mixed bipolar I disorder (HCC) F31.60   2. Insomnia, unspecified type G47.00   3. Generalized anxiety disorder F41.1     Receiving Psychotherapy: Yes With [name], LPC.   RECOMMENDATIONS: Overall, I think she is responding to her medications  well. Continue [name] 1 mg daily. Continue Klonopin 0.5 mg twice daily PRN. Continue Luvox 100 mg 3 daily. Continue gabapentin 600 mg 4 times daily. Continue hydroxyzine 50 mg 1-2 every 6 hours as needed anxiety. Continue trazodone 100 mg 1-2 nightly as needed. I agree with her need of disability and hope the hearing next week goes well. Return in 4 weeks.   [name], PA-C

## 2018-03-10 ENCOUNTER — Ambulatory Visit: Payer: BC Managed Care – PPO | Admitting: Psychiatry

## 2018-03-14 ENCOUNTER — Telehealth: Payer: Self-pay | Admitting: Physician Assistant

## 2018-03-14 NOTE — Telephone Encounter (Signed)
No I don't think it's the meds.  In fact, some of these meds help prevent migraines b/c they help you sleep better, decrease anxiety, and depression.  Have her talk to her neurologist or PCP about them.  I hope she feels better soon.

## 2018-03-14 NOTE — Telephone Encounter (Signed)
Caran called to report that she has had migraines for over a week with bad nausea.  She doesn't want to change medications because they seem to helping mentally.  But do you think the meds are cause the problem?  Please call to discuss. Appt 3/19

## 2018-03-14 NOTE — Telephone Encounter (Signed)
Her message didn't get sent correctly on what she was explaining, she has had increased nausea and vomiting. Just wanted to make you aware in case it is coming from Rexult, takes 1mg  at hs. Sounds like she also has increased stressors, court for disability which is causing migraines and nausea as well. Instructed to call back if symptoms persist. Pt agreed.

## 2018-03-15 ENCOUNTER — Telehealth: Payer: Self-pay | Admitting: Physician Assistant

## 2018-03-15 NOTE — Telephone Encounter (Signed)
She's been on the [place] for months.  I do not think it's the cause and I don't want her to stop it. Has she been binging/purging?  I'm glad she's seeing GI tomorrow.

## 2018-03-15 NOTE — Telephone Encounter (Signed)
Jennifer [name] still feeling very bad. Vomiting, having dry heaves, and hurting. Zofran doesn't seem to help. Made appt with Gertie Fey tomorrow. Please advise.

## 2018-03-16 NOTE — Telephone Encounter (Signed)
Spoke with pt and she has appt today with GI for her hiatal hernia. Admitted to binging/purging last week due to stress from court case. Instructed to continue medications as prescribed and to call back with an update. Pt does feel better this morning.

## 2018-03-17 ENCOUNTER — Ambulatory Visit: Payer: BC Managed Care – PPO | Admitting: Psychiatry

## 2018-03-17 ENCOUNTER — Encounter: Payer: Self-pay | Admitting: Psychiatry

## 2018-03-17 ENCOUNTER — Ambulatory Visit: Payer: BC Managed Care – PPO | Admitting: Physician Assistant

## 2018-03-17 DIAGNOSIS — F422 Mixed obsessional thoughts and acts: Secondary | ICD-10-CM

## 2018-03-17 DIAGNOSIS — F509 Eating disorder, unspecified: Secondary | ICD-10-CM

## 2018-03-17 DIAGNOSIS — F411 Generalized anxiety disorder: Secondary | ICD-10-CM

## 2018-03-17 DIAGNOSIS — G4700 Insomnia, unspecified: Secondary | ICD-10-CM | POA: Diagnosis not present

## 2018-03-17 DIAGNOSIS — F3160 Bipolar disorder, current episode mixed, unspecified: Secondary | ICD-10-CM

## 2018-03-17 DIAGNOSIS — F429 Obsessive-compulsive disorder, unspecified: Secondary | ICD-10-CM | POA: Diagnosis not present

## 2018-03-17 MED ORDER — LURASIDONE HCL 40 MG PO TABS: 40.0000 mg | ORAL_TABLET | Freq: Every day | ORAL | 1 refills | Status: DC

## 2018-03-17 NOTE — Progress Notes (Signed)
[facility]  Patient ID: Jennifer [name],  MRN: [id number]  PCP: Lennie Odor, PA-C  Date of Evaluation: 03/17/18 Time spent:25 minutes  Chief Complaint:  Chief Complaint    Follow-up      HISTORY/CURRENT STATUS: HPI Here for routine med check.  Jennifer [name], was having an 'episode' where "I thought I wasn't going to come down. I was overwhelmed and couldn't calm down.  I'm feeling better today.  Earnest Bailey talked to me and helped me get through it."    Patient denies loss of interest in usual activities and is able to enjoy things.  She continues to do so which is something she likes.  Denies decreased energy or motivation.  Appetite has not changed.  Yesterday she had sadness, was tearful.  But better now.  Denies any changes in concentration, making decisions or remembering things.  Denies suicidal or homicidal thoughts.  States she has been isolating a little bit more than normal.  Not wanting to go to church like she has been.  "I get like this sometimes though and I am trying not to isolate too much."   Patient denies increased energy with decreased need for sleep, but is more talkative, no racing thoughts, no impulsivity or risky behaviors, is really worried about disability, no increased spending, no increased libido, no grandiosity.  She had the disability hearing last week.  States she cried through it and almost vomited a couple of times she was so nervous.  Hopefully she will know something within a couple of months.  OCD is about the same.  Washes her hands "5,000 times a day." But it's no worse.  It is much better than it has been in the past and she does feel like she is responding to the SSRI.   Has joint pain in hands, LFF and wrist is worst . No dystonia. No muscle pain.  Denies dizziness, syncope, seizures, numbness, tingling, tremor, tics, unsteady gait, slurred speech, confusion.   Individual Medical History/ Review of Systems: Changes? :Yes  has a anal  fissure.  Worsening of binge/purge.  She's concerned that the [name] is making her have the urge.  She's read it can cause gambling and increase risk of binging and purging.   Her 'nerves have been torn up.' She had her hearing for disability. Has had more migraines.   Past medications for mental health diagnoses include: Risperdal, Seroquel, Prozac, Zoloft, Latuda, Wellbutrin, Lamictal, Depakote, Xanax, Ambien, trazodone, Trileptal, Luvox, Topamax, Elavil, Pamelor, BuSpar, doxazosin, prazosin, lithium, Vraylar, [name]  Allergies: Codeine; Sonata [zaleplon]; Meloxicam; Other; and Penicillins  Current Medications:  Current Outpatient Medications:  .  clonazePAM (KLONOPIN) 0.5 MG tablet, Take 1 tablet (0.5 mg total) by mouth 2 (two) times daily as needed for anxiety., Disp: 20 tablet, Rfl: 1 .  fluvoxaMINE (LUVOX) 100 MG tablet, Take 3 po qd, Disp: 270 tablet, Rfl: 1 .  gabapentin (NEURONTIN) 600 MG tablet, Take 1 tablet (600 mg total) by mouth 4 (four) times daily., Disp: 120 tablet, Rfl: 5 .  hydrOXYzine (ATARAX/VISTARIL) 50 MG tablet, 1-2 po q6hr prn, Disp: 90 tablet, Rfl: 5 .  traZODone (DESYREL) 100 MG tablet, Take 1-2 tablets (100-200 mg total) by mouth at bedtime as needed for sleep., Disp: 60 tablet, Rfl: 5 .  lurasidone (LATUDA) 40 MG TABS tablet, Take 1 tablet (40 mg total) by mouth daily with breakfast. Must take with food., Disp: 30 tablet, Rfl: 1 Medication Side Effects: none  Family Medical/ Social History: Changes? No  MENTAL  HEALTH EXAM:  Last menstrual period 10/07/2011.There is no height or weight on file to calculate BMI.  General Appearance: Casual and Well Groomed  Eye Contact:  Good  Speech:  Clear and Coherent  Volume:  Normal  Mood:  Euthymic  Affect:  Appropriate  Thought Process:  Goal Directed  Orientation:  Full (Time, Place, and Person)  Thought Content: Logical   Suicidal Thoughts:  No  Homicidal Thoughts:  No  Memory:  WNL  Judgement:  Good  Insight:   Good  Psychomotor Activity:  Normal  Concentration:  Concentration: Good  Recall:  Good  Fund of Knowledge: Good  Language: Good  Assets:  Desire for Improvement  ADL's:  Intact  Cognition: WNL  Prognosis:  Good    DIAGNOSES:    ICD-10-CM   1. Mixed bipolar I disorder (HCC) F31.60   2. Insomnia, unspecified type G47.00   3. Generalized anxiety disorder F41.1   4. Obsessive-compulsive disorder, unspecified type F42.9   5. Eating disorder, unspecified type F50.9     Receiving Psychotherapy: Yes    RECOMMENDATIONS: Rosea really wants to change the [name] to something else.  Maybe back to [place] Filter states she did well on it.  I do not have her paper chart in front of me so I cannot remember exactly the reason we went off of it and switched to [place].  It could have been a dose problem.  I recommend that we use Latuda instead of changing back to the [place].  She has not been on [place] that she can remember and I do not have it documented.  I have cautioned her however, to remember at this time she is having joint pain in her hands and that the nausea and vomiting, binge/purge is already a problem and if these continue it does not mean that the Latuda is causing the problem.  She has to remember that she has an eating disorder already.  She is also complaining of really dry skin and itching.  I have reminded her that if she does have continued itching and dryness that is not necessarily a reaction to the drug.  If she does get a rash tongue swelling, shortness of breath, of course call immediately or go to the ER. Start Latuda 20 mg q. evening with food (that point was stressed and she cannot throw up after she takes it or it will not help.  She verbalizes understanding) for 3 days and then 40 mg q. evening with food. Stop [name] after 3 days. Continue Klonopin 0.5 mg 1/2-1 twice daily as needed. Continue Luvox 100 mg 3 daily. Continue gabapentin 600 mg 4 times daily. Continue hydroxyzine  50 mg 4 times daily as needed. Continue trazodone 100 mg 1-2 nightly as needed. Continue psychotherapy with [name], LPC. Return in approximately 4 weeks.   [name], PA-C   This record has been created using [employer].  Chart creation errors have been sought, but may not always have been located and corrected. Such creation errors do not reflect on the standard of medical care.

## 2018-03-17 NOTE — Progress Notes (Signed)
      [facility] Counselor/Therapist Progress Note  Patient ID: Jennifer [name], MRN: [id number],    Date: 03/17/2018  Time Spent: 48 minutes  Treatment Type: Individual Therapy  Reported Symptoms: anxiety, obsessive thoughts, itching, crying spells, depression  Mental Status Exam:  Appearance:   Well Groomed     Behavior:  Appropriate  Motor:  Restlestness  Speech/Language:   Normal Rate  Affect:  Congruent  Mood:  anxious  Thought process:  circumstantial  Thought content:    Rumination  Sensory/Perceptual disturbances:    Flashback  Orientation:  oriented to person, place and time/date  Attention:  Good  Concentration:  Good  Memory:  Immediate;   [place] of knowledge:   Fair  Insight:    Fair  Judgment:   Fair  Impulse Control:  Fair   Risk Assessment: Danger to Self:  No Self-injurious Behavior: No Danger to Others: No Duty to Warn:no Physical Aggression / Violence:No  Access to Firearms a concern: No  Gang Involvement:No   Subjective: Patient was present for session.  Patient reported she has been struggling greatly.  She shared she is having lots of sickness some of what she is causing some of which she is not caused.  Patient explained she is going through new medication change and she is hopeful that things will get better.  Patient was encouraged to develop a new treatment plan in session and figure out how to get more focused.  It was agreed that more CBT skills would be discussed and practiced with patient.  Reviewed some of the different skills discussed in previous session with patient.  The importance of her focusing on her thoughts were to addressed.  Patient was given a list of cognitive distortions and encouraged just to start writing down the cognitive distortions as she recognizes them at this time.  It was agreed that ways to alter them would be discussed at further sessions.  Patient explained she gets very overwhelmed when too much homework is given  at one session.  Patient was able to recognize some of the patterns she has with cognitive distortions as she read the list and agreed to start writing them down regularly to discuss in session.  Interventions: Cognitive Behavioral Therapy and Solution-Oriented/Positive Psychology  Diagnosis:   ICD-10-CM   1. Mixed obsessional thoughts and acts F42.2   2. Generalized anxiety disorder F41.1     Plan: 1.  Patient to continue to engage in individual counseling 2-4 times a month or as needed. 2.  Patient to identify and apply CBT, coping skills learned in session to decrease depression and anxiety symptoms. 3.  Patient to contact this office, go to the local ED or call 911 if a crisis or emergency develops between visits.  [name], [place]    This record has been created using [employer].  Chart creation errors have been sought, but may not always have been located and corrected. Such creation errors do not reflect on the standard of medical care.

## 2018-03-18 ENCOUNTER — Encounter: Payer: Self-pay | Admitting: Physician Assistant

## 2018-03-21 ENCOUNTER — Telehealth: Payer: Self-pay | Admitting: Physician Assistant

## 2018-03-21 NOTE — Telephone Encounter (Signed)
Left voicemail with information and to call back tomorrow

## 2018-03-21 NOTE — Telephone Encounter (Signed)
Miyeko called to report that the Jennifer [name] is not working.  It is causing nausea, drunken stooper, wetting herself.  She went back to [place] and did much better.  Please call to discuss.  Next appt 04/07/18

## 2018-03-21 NOTE — Telephone Encounter (Signed)
Reassure her.  Continue [name].  Drink plenty of fluids and I think she has nausea medicine already said go ahead and take that if needed.  If she continues with enuresis, she should see her PCP to make sure she does not have a urinary tract infection.  That can cause these symptoms as well.

## 2018-03-22 NOTE — Telephone Encounter (Signed)
She has called again today multiple times crying. She's all over the place. Says latuda made her extremely sick on Thursday and Friday night, she only took for 2 nights. Instructed to continue [name] with the fluids per provider, says she can't do that. Wants to come in for an appointment, explained she was just seen on Thursday,26th . Recommend PCP, she says it's not anything related to that. Says to let you know that you know about everything.

## 2018-03-22 NOTE — Telephone Encounter (Signed)
Thank you :)

## 2018-03-22 NOTE — Telephone Encounter (Signed)
I called her back, FYI.

## 2018-03-28 ENCOUNTER — Telehealth: Payer: Self-pay

## 2018-03-28 NOTE — Telephone Encounter (Signed)
Prior authorization submitted for Latuda 40mg  approved effective 03/18/2018-03/18/2021  [place] aware

## 2018-03-31 ENCOUNTER — Ambulatory Visit: Payer: BC Managed Care – PPO | Admitting: Psychiatry

## 2018-04-07 ENCOUNTER — Ambulatory Visit: Payer: BC Managed Care – PPO | Admitting: Physician Assistant

## 2018-04-11 ENCOUNTER — Ambulatory Visit: Payer: BC Managed Care – PPO | Admitting: Psychiatry

## 2018-04-18 ENCOUNTER — Encounter: Payer: Self-pay | Admitting: Physician Assistant

## 2018-04-18 ENCOUNTER — Ambulatory Visit (INDEPENDENT_AMBULATORY_CARE_PROVIDER_SITE_OTHER): Payer: BC Managed Care – PPO | Admitting: Physician Assistant

## 2018-04-18 DIAGNOSIS — F3160 Bipolar disorder, current episode mixed, unspecified: Secondary | ICD-10-CM

## 2018-04-18 DIAGNOSIS — F411 Generalized anxiety disorder: Secondary | ICD-10-CM

## 2018-04-18 DIAGNOSIS — F422 Mixed obsessional thoughts and acts: Secondary | ICD-10-CM

## 2018-04-18 MED ORDER — GABAPENTIN 600 MG PO TABS: 600.0000 mg | ORAL_TABLET | Freq: Four times a day (QID) | ORAL | 5 refills | Status: DC

## 2018-04-18 MED ORDER — FLUVOXAMINE MALEATE 100 MG PO TABS: ORAL_TABLET | ORAL | 1 refills | Status: DC

## 2018-04-18 MED ORDER — CLONAZEPAM 0.5 MG PO TABS: 0.5000 mg | ORAL_TABLET | Freq: Two times a day (BID) | ORAL | 1 refills | Status: DC | PRN

## 2018-04-18 MED ORDER — LURASIDONE HCL 40 MG PO TABS: 40.0000 mg | ORAL_TABLET | Freq: Every day | ORAL | 1 refills | Status: DC

## 2018-04-18 NOTE — Progress Notes (Signed)
[facility]  Patient ID: Jennifer [name],  MRN: [id number]  PCP: Lennie Odor, PA-C  Date of Evaluation: 04/18/2018 Time spent:15 minutes  Chief Complaint:  Chief Complaint    Follow-up     Virtual Visit via Telephone Note  I connected with Jennifer [name] on 04/18/18 at  2:00 PM EDT by telephone and verified that I am speaking with the correct person using two identifiers.   I discussed the limitations, risks, security and privacy concerns of performing an evaluation and management service by telephone and the availability of in person appointments. I also discussed with the patient that there may be a patient responsible charge related to this service. The patient expressed understanding and agreed to proceed.     HISTORY/CURRENT STATUS: HPI For routine med check.  Jennifer [name] reports that she has been sick with a sinus/respiratory infection for the past couple of weeks.  She did have a fever initially but that improved.  She has seen her PCP and was put on Septra and Singulair.  She states she is feeling some better today.  At the last visit, we changed back from [place] to [place].  States that she thinks it is working really well and wants to continue it.  She is eating when she takes it in has no history of purging since the last visit.  Also denies binging.  As far as her mood goes, she states she is doing pretty well. Patient denies loss of interest in usual activities and is able to enjoy things.  Denies decreased energy or motivation.  Appetite has not changed.  No extreme sadness, tearfulness, or feelings of hopelessness.  Denies any changes in concentration, making decisions or remembering things.  Denies suicidal or homicidal thoughts.  Patient denies increased energy with decreased need for sleep, no increased talkativeness, no racing thoughts, no impulsivity or risky behaviors, no increased spending, no increased libido, no grandiosity.  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  She is happy that she can be helpful in that way, during the coronavirus pandemic.  Denies muscle or joint pain, stiffness, or dystonia.  Denies dizziness, syncope, seizures, numbness, tingling, tremor, tics, unsteady gait, slurred speech, confusion.   Individual Medical History/ Review of Systems: Changes? :Yes has respiratory/sinus infection  Past medications for mental health diagnoses include: Risperdal, Seroquel, Prozac, Zoloft, Latuda, Wellbutrin, Lamictal, Depakote, Xanax, Ambien, trazodone, Trileptal, Luvox, Topamax, Elavil, Pamelor, BuSpar, doxazosin, prazosin, lithium, Vraylar, [name]  Allergies: Codeine; Sonata [zaleplon]; Meloxicam; Other; and Penicillins  Current Medications:  Current Outpatient Medications:  .  gabapentin (NEURONTIN) 600 MG tablet, Take 1 tablet (600 mg total) by mouth 4 (four) times daily., Disp: 120 tablet, Rfl: 5 .  hydrOXYzine (ATARAX/VISTARIL) 50 MG tablet, 1-2 po q6hr prn, Disp: 90 tablet, Rfl: 5 .  lurasidone (LATUDA) 40 MG TABS tablet, Take 1 tablet (40 mg total) by mouth daily with breakfast. Must take with food., Disp: 30 tablet, Rfl: 1 .  traZODone (DESYREL) 100 MG tablet, Take 1-2 tablets (100-200 mg total) by mouth at bedtime as needed for sleep., Disp: 60 tablet, Rfl: 5 .  clonazePAM (KLONOPIN) 0.5 MG tablet, Take 1 tablet (0.5 mg total) by mouth 2 (two) times daily as needed for anxiety., Disp: 20 tablet, Rfl: 1 .  fluvoxaMINE (LUVOX) 100 MG tablet, Take 3 po qd, Disp: 270 tablet, Rfl: 1 Medication Side Effects: none  Family Medical/ Social History: Changes? No  MENTAL HEALTH EXAM:  Last menstrual period 10/07/2011.There is no height or weight on file to calculate BMI.  General Appearance: Phone visit, unable to assess  Eye Contact:  Unable to assess  Speech:  Clear and Coherent and Talkative  Volume:  Normal  Mood:  Euthymic  Affect:  Unable to assess   Thought Process:  Goal Directed  Orientation:  Full (Time, Place, and Person)  Thought Content: Logical   Suicidal Thoughts:  No  Homicidal Thoughts:  No  Memory:  WNL  Judgement:  Good  Insight:  Good  Psychomotor Activity:  Unable to assess  Concentration:  Concentration: Good  Recall:  Good  Fund of Knowledge: Good  Language: Good  Assets:  Desire for Improvement  ADL's:  Intact  Cognition: WNL  Prognosis:  Good    DIAGNOSES:    ICD-10-CM   1. Mixed obsessional thoughts and acts F42.2   2. Mixed bipolar I disorder (HCC) F31.60   3. Generalized anxiety disorder F41.1     Receiving Psychotherapy: Yes With [name], [facility]   RECOMMENDATIONS: Continue Klonopin 0.5 mg 1 p.o. twice daily as needed. Continue Luvox 300 mg nightly. Continue gabapentin 600 mg 1 4 times daily. Continue hydroxyzine 50 mg every 6 hours as needed. Continue Latuda 40 mg daily with food.  Importance was stressed. Continue trazodone 100 to 200 mg nightly as needed sleep. Continue psychotherapy with [name], LPC. Return in 4 weeks or sooner as needed.  [name], PA-C   This record has been created using [employer].  Chart creation errors have been sought, but may not always have been located and corrected. Such creation errors do not reflect on the standard of medical care.

## 2018-04-28 ENCOUNTER — Other Ambulatory Visit: Payer: Self-pay

## 2018-04-28 ENCOUNTER — Ambulatory Visit (INDEPENDENT_AMBULATORY_CARE_PROVIDER_SITE_OTHER): Payer: Medicare Other | Admitting: Psychiatry

## 2018-04-28 DIAGNOSIS — F411 Generalized anxiety disorder: Secondary | ICD-10-CM

## 2018-04-28 DIAGNOSIS — F3160 Bipolar disorder, current episode mixed, unspecified: Secondary | ICD-10-CM | POA: Diagnosis not present

## 2018-04-28 NOTE — Progress Notes (Signed)
[facility] Counselor/Therapist Progress Note  Patient ID: Jennifer [name], MRN: [id number],    Date: 04/30/2018  Time Spent: 49 minutes  Start time 11:00 AM  End time11:49 I connected with patient by a video enabled telemedicine application or telephone, with their informed consent, and verified patient privacy and that I am speaking with the correct person using two identifiers.  I was located at home and patient at her home.  Treatment Type: Individual Therapy  Reported Symptoms: anxiety, sadness, sleep issues, irritability, crying spells  Mental Status Exam:  Appearance:   Casual     Behavior:  Sharing  Motor:  Normal  Speech/Language:   Normal Rate  Affect:  Appropriate  Mood:  sad  Thought process:  circumstantial  Thought content:    Rumination  Sensory/Perceptual disturbances:    WNL  Orientation:  oriented to person, place, time/date and situation  Attention:  Good  Concentration:  Good  Memory:  Immediate;   [place] of knowledge:   Fair  Insight:    Fair  Judgment:   Fair  Impulse Control:  Fair   Risk Assessment: Danger to Self:  No Self-injurious Behavior: No Danger to Others: No Duty to Warn:no Physical Aggression / Violence:No  Access to Firearms a concern: No  Gang Involvement:No   Subjective: Met with pt via virtual session by [employer].  Pt reported that she has not been doing well.  She explained she has been sick and is still not feeling great.  She has also been very upset over her relationship with her husband as well as other relationships.  Patient explained she has been frustrated with the way that her husband treats her and called their minister, who talked with him.  He told pt that he said ugly things about her which upset her.  Pt was encouraged to realize he was giving her his interpretation of the conversation so she had to filter what he told her with that understanding.  Also, she had the option to choose going to another church and not  communicating with him again if that was her choice.  Pt went on to share things have been better with her husband recently and she is trying to see he has positives.  Encouraged pt to work on focusing more on the positives with him and her life rather than focusing on the negatives.  Discussed the CBT homework that she had been given at last session.  She shared that she is getting more aware of her cognitive distortions.  The importance of going to the next step and putting in the truth was discussed with pt.  Strategies to help her do that were addressed in session and pt was able to develop some plans she felt positive about.  Pt agreed to keep working in the CBT workbook and will address more at next session.  Interventions: Cognitive Behavioral Therapy and Solution-Oriented/Positive Psychology  Diagnosis:   ICD-10-CM   1. Mixed bipolar I disorder (HCC) F31.60   2. Generalized anxiety disorder F41.1     Plan: 1.  Patient to continue to engage in individual counseling 2-4 times a month or as needed. 2.  Patient to identify and apply CBT, coping skills learned in session to decrease depression and anxiety symptoms. 3.  Patient to contact this office, go to the local ED or call 911 if a crisis or emergency develops between visits.  Jennifer [name], [facility]   This record has been created using  Editor, commissioning.  Chart creation errors have been sought, but may not always have been located and corrected. Such creation errors do not reflect on the standard of medical care.

## 2018-04-30 ENCOUNTER — Encounter: Payer: Self-pay | Admitting: Psychiatry

## 2018-05-04 ENCOUNTER — Emergency Department (HOSPITAL_COMMUNITY)
Admission: EM | Admit: 2018-05-04 | Discharge: 2018-05-04 | Disposition: A | Discharge status: Home / Self Care | Payer: Medicare Other | Attending: Emergency Medicine | Admitting: Emergency Medicine

## 2018-05-04 ENCOUNTER — Encounter (HOSPITAL_COMMUNITY): Payer: Self-pay | Admitting: Emergency Medicine

## 2018-05-04 ENCOUNTER — Other Ambulatory Visit: Payer: Self-pay

## 2018-05-04 DIAGNOSIS — G43009 Migraine without aura, not intractable, without status migrainosus: Secondary | ICD-10-CM | POA: Insufficient documentation

## 2018-05-04 DIAGNOSIS — Z79899 Other long term (current) drug therapy: Secondary | ICD-10-CM | POA: Insufficient documentation

## 2018-05-04 DIAGNOSIS — F17210 Nicotine dependence, cigarettes, uncomplicated: Secondary | ICD-10-CM | POA: Diagnosis not present

## 2018-05-04 DIAGNOSIS — R51 Headache: Secondary | ICD-10-CM | POA: Diagnosis present

## 2018-05-04 LAB — CBC WITH DIFFERENTIAL/PLATELET
Abs Immature Granulocytes: 0.02 10*3/uL (ref 0.00–0.07)
Basophils Absolute: 0.1 10*3/uL (ref 0.0–0.1)
Basophils Relative: 1 %
Eosinophils Absolute: 0.2 10*3/uL (ref 0.0–0.5)
Eosinophils Relative: 2 %
HCT: 40.8 % (ref 36.0–46.0)
Hemoglobin: 13.7 g/dL (ref 12.0–15.0)
Immature Granulocytes: 0 %
Lymphocytes Relative: 34 %
Lymphs Abs: 3.3 10*3/uL (ref 0.7–4.0)
MCH: 30 pg (ref 26.0–34.0)
MCHC: 33.6 g/dL (ref 30.0–36.0)
MCV: 89.3 fL (ref 80.0–100.0)
Monocytes Absolute: 1 10*3/uL (ref 0.1–1.0)
Monocytes Relative: 10 %
Neutro Abs: 5.3 10*3/uL (ref 1.7–7.7)
Neutrophils Relative %: 53 %
Platelets: 184 10*3/uL (ref 150–400)
RBC: 4.57 MIL/uL (ref 3.87–5.11)
RDW: 12.8 % (ref 11.5–15.5)
WBC: 9.9 10*3/uL (ref 4.0–10.5)
nRBC: 0 % (ref 0.0–0.2)

## 2018-05-04 LAB — BASIC METABOLIC PANEL
Anion gap: 10 (ref 5–15)
BUN: 16 mg/dL (ref 6–20)
CO2: 24 mmol/L (ref 22–32)
Calcium: 9.5 mg/dL (ref 8.9–10.3)
Chloride: 104 mmol/L (ref 98–111)
Creatinine, Ser: 0.98 mg/dL (ref 0.44–1.00)
GFR calc Af Amer: >60 mL/min (ref >60)
GFR calc non Af Amer: >60 mL/min (ref >60)
Glucose, Bld: 92 mg/dL (ref 70–99)
Potassium: 3.9 mmol/L (ref 3.5–5.1)
Sodium: 138 mmol/L (ref 135–145)

## 2018-05-04 MED ORDER — DIPHENHYDRAMINE HCL 50 MG/ML IJ SOLN
25.0000 mg | Freq: Once | INTRAMUSCULAR | Status: AC
  Administered 2018-05-04: 25 mg via INTRAVENOUS
  Filled 2018-05-04: qty 1

## 2018-05-04 MED ORDER — KETOROLAC TROMETHAMINE 30 MG/ML IJ SOLN
30.0000 mg | Freq: Once | INTRAMUSCULAR | Status: AC
  Administered 2018-05-04: 30 mg via INTRAVENOUS
  Filled 2018-05-04: qty 1

## 2018-05-04 MED ORDER — METOCLOPRAMIDE HCL 5 MG/ML IJ SOLN
10.0000 mg | Freq: Once | INTRAMUSCULAR | Status: AC
  Administered 2018-05-04: 19:00:00 10 mg via INTRAVENOUS
  Filled 2018-05-04: qty 2

## 2018-05-04 MED ORDER — SODIUM CHLORIDE 0.9 % IV BOLUS
1000.0000 mL | Freq: Once | INTRAVENOUS | Status: AC
  Administered 2018-05-04: 19:00:00 1000 mL via INTRAVENOUS

## 2018-05-04 NOTE — ED Triage Notes (Signed)
Patient reports headache intermittently since Sunday. Was seen by physician today and was given an injection of demerol, reports headache has not eased up.

## 2018-05-04 NOTE — ED Provider Notes (Signed)
[facility] EMERGENCY DEPARTMENT Provider Note   CSN: [id number] Arrival date & time: 05/04/18  1740    History   Chief Complaint Chief Complaint  Patient presents with  . Headache    HPI Jennifer [name] is a 53 y.o. female.     The history is provided by the patient. No language interpreter was used.  Headache  Pain location:  Generalized Quality:  Sharp Radiates to:  Does not radiate Onset quality:  Gradual Duration:  1 hour Timing:  Constant Progression:  Worsening Chronicity:  New Similar to prior headaches: yes   Context: not activity   Relieved by:  Nothing Worsened by:  Nothing Ineffective treatments:  None tried Associated symptoms: no focal weakness    Pt reports she has had a migraine today.  Pt reports she had a shot of phenergan and demerol at urgent care with no relief.  Past Medical History:  Diagnosis Date  . Binge-eating and purging type anorexia nervosa   . Bipolar disorder ([place])   . Cystitis, interstitial   . OCD (obsessive compulsive disorder)     Patient Active Problem List   Diagnosis Date Noted  . Generalized anxiety disorder 09/27/2017  . Mixed bipolar I disorder ([place]) 12/05/2015  . OCD (obsessive compulsive disorder) 12/04/2015  . Bipolar affective disorder ([place]) 12/04/2015  . Bipolar affective disorder, current episode manic without psychotic symptoms ([place]) 12/04/2015  . Palpitations 05/25/2014  . Chest pain 05/25/2014  . Cellulitis and abscess of buttock 02/15/2013    Past Surgical History:  Procedure Laterality Date  . BLADDER SURGERY    . CHOLECYSTECTOMY    . VOCAL CORD LATERALIZATION, ENDOSCOPIC APPROACH W/ MLB       OB History    Gravida  3   Para      Term      Preterm      AB  3   Living  0     SAB  1   TAB  2   Ectopic      Multiple      Live Births               Home Medications    Prior to Admission medications   Medication Sig Start Date End Date Taking? Authorizing Provider   clonazePAM (KLONOPIN) 0.5 MG tablet Take 1 tablet (0.5 mg total) by mouth 2 (two) times daily as needed for anxiety. 04/18/18   [name] T, PA-C  fluvoxaMINE (LUVOX) 100 MG tablet Take 3 po qd 04/18/18   [name] T, PA-C  gabapentin (NEURONTIN) 600 MG tablet Take 1 tablet (600 mg total) by mouth 4 (four) times daily. 04/18/18   [name] T, PA-C  hydrOXYzine (ATARAX/VISTARIL) 50 MG tablet 1-2 po q6hr prn 03/02/18   [name] T, PA-C  lurasidone (LATUDA) 40 MG TABS tablet Take 1 tablet (40 mg total) by mouth daily with breakfast. Must take with food. 04/18/18   Addison Lank, PA-C  traZODone (DESYREL) 100 MG tablet Take 1-2 tablets (100-200 mg total) by mouth at bedtime as needed for sleep. 03/02/18   Addison Lank PA-C    Family History History reviewed. No pertinent family history.  Social History Social History   Tobacco Use  . Smoking status: Current Every Day Smoker    Packs/day: 0.50    Types: Cigarettes    Start date: 01/20/1988  . Smokeless tobacco: Never Used  Substance Use Topics  . Alcohol use: No    Alcohol/week: 0.0  standard drinks  . Drug use: No     Allergies   Codeine; Sonata [zaleplon]; Meloxicam; Other; and Penicillins   Review of Systems Review of Systems  Neurological: Positive for headaches. Negative for focal weakness.  All other systems reviewed and are negative.    Physical Exam Updated Vital Signs BP (!) 142/102 (BP Location: Right Arm)   Pulse (!) 102   Temp 98.1 F (36.7 C) (Oral)   Resp 16   Ht 5\' 4"  (1.626 m)   Wt 75.8 kg   LMP 10/07/2011   SpO2 97%   BMI 28.67 kg/m   Physical Exam Vitals signs and nursing note reviewed.  Constitutional:      Appearance: She is well-developed.  HENT:     Head: Normocephalic.  Eyes:     Extraocular Movements: Extraocular movements intact.  Neck:     Musculoskeletal: Normal range of motion.  Cardiovascular:     Rate and Rhythm: Normal rate.     Heart sounds: Normal heart sounds.   Pulmonary:     Effort: Pulmonary effort is normal.  Abdominal:     General: There is no distension.  Musculoskeletal: Normal range of motion.  Skin:    General: Skin is warm.  Neurological:     Mental Status: She is alert and oriented to person, place, and time.  Psychiatric:        Mood and Affect: Mood normal.      ED Treatments / Results  Labs (all labs ordered are listed, but only abnormal results are displayed) Labs Reviewed  CBC WITH DIFFERENTIAL/PLATELET  BASIC METABOLIC PANEL    EKG None  Radiology No results found.  Procedures Procedures (including critical care time)  Medications Ordered in ED Medications  sodium chloride 0.9 % bolus 1,000 mL (0 mLs Intravenous Stopped 05/04/18 1923)  metoCLOPramide (REGLAN) injection 10 mg (10 mg Intravenous Given 05/04/18 1836)  diphenhydrAMINE (BENADRYL) injection 25 mg (25 mg Intravenous Given 05/04/18 1835)  ketorolac (TORADOL) 30 MG/ML injection 30 mg (30 mg Intravenous Given 05/04/18 1832)     Initial Impression / Assessment and Plan / ED Course  I have reviewed the triage vital signs and the nursing notes.  Pertinent labs & imaging results that were available during my care of the patient were reviewed by me and considered in my medical decision making (see chart for details).        MDM  Pt given Iv fluids x 1 liter, torodol, reglan and benadryl.  Pt reports her headache is much better.   Final Clinical Impressions(s) / ED Diagnoses   Final diagnoses:  Migraine without aura and without status migrainosus, not intractable    ED Discharge Orders    None    An After Visit Summary was printed and given to the patient.    Sidney Ace 05/04/18 Verlee Rossetti, MD 05/07/18 [phone]

## 2018-05-04 NOTE — Discharge Instructions (Signed)
Return if any problems.

## 2018-05-05 ENCOUNTER — Ambulatory Visit: Payer: BC Managed Care – PPO | Admitting: Physician Assistant

## 2018-05-06 ENCOUNTER — Telehealth: Payer: Self-pay | Admitting: Physician Assistant

## 2018-05-06 NOTE — Telephone Encounter (Signed)
I spoke w/ pt.  She's already taken Zyrtec and feels better w/ allergies. Instructed to cont it prn.

## 2018-05-06 NOTE — Telephone Encounter (Signed)
Patient called and said that she has really bad allergies and she needs to know what medicine she can take that will not interfere with the latuda.? Please call her and let her know

## 2018-05-16 ENCOUNTER — Encounter: Payer: Self-pay | Admitting: Physician Assistant

## 2018-05-16 ENCOUNTER — Other Ambulatory Visit: Payer: Self-pay

## 2018-05-16 ENCOUNTER — Ambulatory Visit: Payer: BC Managed Care – PPO | Admitting: Physician Assistant

## 2018-05-16 DIAGNOSIS — G4700 Insomnia, unspecified: Secondary | ICD-10-CM | POA: Diagnosis not present

## 2018-05-16 DIAGNOSIS — F422 Mixed obsessional thoughts and acts: Secondary | ICD-10-CM | POA: Diagnosis not present

## 2018-05-16 DIAGNOSIS — F411 Generalized anxiety disorder: Secondary | ICD-10-CM

## 2018-05-16 DIAGNOSIS — F509 Eating disorder, unspecified: Secondary | ICD-10-CM

## 2018-05-16 DIAGNOSIS — F3160 Bipolar disorder, current episode mixed, unspecified: Secondary | ICD-10-CM | POA: Diagnosis not present

## 2018-05-16 MED ORDER — LURASIDONE HCL 40 MG PO TABS: 40.0000 mg | ORAL_TABLET | Freq: Every day | ORAL | 5 refills | Status: DC

## 2018-05-16 MED ORDER — FLUVOXAMINE MALEATE 100 MG PO TABS: ORAL_TABLET | ORAL | 1 refills | Status: DC

## 2018-05-16 NOTE — Progress Notes (Signed)
[facility] Med Check  Patient ID: Jennifer [name],  MRN: [id number]  PCP: Lennie Odor, PA-C  Date of Evaluation: 05/16/2018 Time spent:15 minutes  Chief Complaint:  Chief Complaint    Follow-up     Virtual Visit via Telephone Note  I connected with patient by a video enabled telemedicine application or telephone, with their informed consent, and verified patient privacy and that I am speaking with the correct person using two identifiers.  I am private, in my home and the patient is home.  I discussed the limitations, risks, security and privacy concerns of performing an evaluation and management service by telephone and the availability of in person appointments. I also discussed with the patient that there may be a patient responsible charge related to this service. The patient expressed understanding and agreed to proceed.   I discussed the assessment and treatment plan with the patient. The patient was provided an opportunity to ask questions and all were answered. The patient agreed with the plan and demonstrated an understanding of the instructions.   The patient was advised to call back or seek an in-person evaluation if the symptoms worsen or if the condition fails to improve as anticipated.  I provided 15 minutes of non-face-to-face time during this encounter.  HISTORY/CURRENT STATUS: HPI For routine med check.  She's doing pretty well right now.  A few weeks ago, she tried to decrease Luvox.  "I thought it would be okay to go down to 200 mg.  I just want to see how well a day without taking the higher dose.  After a week or so, I started feeling really low so I went back 300 mg."  Other than severe seasonal allergies, feels well physically and mentally.  She is able to enjoy things like reading and sewing.  She has been making masks for the hospital because of the need due to the coronavirus pandemic.  Her energy and motivation are good.  She is not crying easily right  now although she did cry more when she decreased the Luvox.  She is tired of being isolated due to the pandemic and would like to go out and do things.  Anxiety is pretty well controlled with the hydroxyzine.  She rarely takes Klonopin.  She does report that her memory can be bad but it does seem to be a little worse when she is more anxious.  Denies binging and purging recently.  She sleeps well, and not too much.  Patient denies increased energy with decreased need for sleep, no increased talkativeness, no racing thoughts, no impulsivity or risky behaviors, no increased spending, no increased libido, no grandiosity.  Denies muscle or joint pain, stiffness, or dystonia.  Denies dizziness, syncope, seizures, numbness, tingling, tremor, tics, unsteady gait, slurred speech, confusion.   Individual Medical History/ Review of Systems: Changes? :No    Past medications for mental health diagnoses include: Risperdal, Seroquel, Prozac, Zoloft, Latuda, Wellbutrin, Lamictal, Depakote, Xanax, Ambien, trazodone, Trileptal, Luvox, Topamax, Elavil, Pamelor, BuSpar, doxazosin, prazosin, lithium, Vraylar, [name]  Allergies: Codeine; Sonata [zaleplon]; Meloxicam; Other; and Penicillins  Current Medications:  Current Outpatient Medications:  .  clonazePAM (KLONOPIN) 0.5 MG tablet, Take 1 tablet (0.5 mg total) by mouth 2 (two) times daily as needed for anxiety., Disp: 20 tablet, Rfl: 1 .  fluvoxaMINE (LUVOX) 100 MG tablet, Take 3 po qd, Disp: 270 tablet, Rfl: 1 .  gabapentin (NEURONTIN) 600 MG tablet, Take 1 tablet (600 mg total) by mouth 4 (four) times daily., Disp:  120 tablet, Rfl: 5 .  hydrOXYzine (ATARAX/VISTARIL) 50 MG tablet, 1-2 po q6hr prn, Disp: 90 tablet, Rfl: 5 .  lurasidone (LATUDA) 40 MG TABS tablet, Take 1 tablet (40 mg total) by mouth daily with breakfast. Must take with food., Disp: 30 tablet, Rfl: 5 .  traZODone (DESYREL) 100 MG tablet, Take 1-2 tablets (100-200 mg total) by mouth at  bedtime as needed for sleep., Disp: 60 tablet, Rfl: 5 Medication Side Effects: none  Family Medical/ Social History: Changes? Yes shelter in place is getting to her.   MENTAL HEALTH EXAM:  Last menstrual period 10/07/2011.There is no height or weight on file to calculate BMI.  General Appearance: telephone visit, unable to assess  Eye Contact:  unable to assesss  Speech:  Clear and Coherent  Volume:  Normal  Mood:  Euthymic  Affect:  unable to assess  Thought Process:  Goal Directed  Orientation:  Full (Time, Place, and Person)  Thought Content: Logical   Suicidal Thoughts:  No  Homicidal Thoughts:  No  Memory:  WNL  Judgement:  Good  Insight:  Good  Psychomotor Activity:  unable to assess  Concentration:  Concentration: Good  Recall:  Good  Fund of Knowledge: Good  Language: Good  Assets:  Desire for Improvement  ADL's:  Intact  Cognition: WNL  Prognosis:  Good    DIAGNOSES:    ICD-10-CM   1. Mixed bipolar I disorder (HCC) F31.60   2. Generalized anxiety disorder F41.1   3. Mixed obsessional thoughts and acts F42.2   4. Insomnia, unspecified type G47.00   5. Eating disorder, unspecified type F50.9     Receiving Psychotherapy: Yes  With [name], [facility]   RECOMMENDATIONS:  Discussed the fact that she should not go off or change doses of any of her medications without discussing with me first. Continue Luvox 100 mg, 3 p.o. nightly. Continue Klonopin 0.5 mg twice daily as needed.  She rarely takes.  PDMP was reviewed. Continue gabapentin 600 mg 4 times daily. Continue hydroxyzine 50 mg, 1-2 every 6 hours as needed anxiety. Continue Latuda 40 mg daily with food. Continue trazodone 100 mg, 1-2 nightly as needed sleep. Continue psychotherapy with [name], LPC. Return in 6 weeks.  [name], PA-C   This record has been created using [employer].  Chart creation errors have been sought, but may not always have been located and corrected. Such creation  errors do not reflect on the standard of medical care.

## 2018-05-17 ENCOUNTER — Telehealth: Payer: Self-pay | Admitting: Psychiatry
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
## 2018-05-19 ENCOUNTER — Ambulatory Visit: Payer: BC Managed Care – PPO | Admitting: Psychiatry

## 2018-05-23 ENCOUNTER — Telehealth: Payer: Self-pay | Admitting: Psychiatry

## 2018-05-23 NOTE — Telephone Encounter (Signed)
Returned patient's call.  She reported that she has been very irritable recently.  Patient was encouraged to get back to using coping skills that she knows to help her manage her emotions more appropriately.  Reminded her that everybody is struggling with the current situation and it is intensifying emotions and negative manners.  That is what is important for her to be more proactive than reactive with her emotions and to release them regularly before she gets explosive point.  Patient agreed to try and work on her CBT book and to practice her coping skills more regularly.

## 2018-05-26 ENCOUNTER — Other Ambulatory Visit: Payer: Self-pay

## 2018-05-26 ENCOUNTER — Ambulatory Visit (INDEPENDENT_AMBULATORY_CARE_PROVIDER_SITE_OTHER): Payer: Medicare Other | Admitting: Psychiatry

## 2018-05-26 DIAGNOSIS — F3160 Bipolar disorder, current episode mixed, unspecified: Secondary | ICD-10-CM

## 2018-05-26 NOTE — Progress Notes (Signed)
[facility] Counselor/Therapist Progress Note  Patient ID: CORY RAMA, MRN: [id number],    Date: 05/26/2018  Time Spent: 60 minutes    9:00am to 10:00am  Treatment Type: Individual Therapy   Virtual Visit Note: Connected with patient by a video enabled telemedicine/telehealth application or telephone, with their informed consent, and verified patient privacy and that I am speaking with the correct person using two identifiers. I discussed the limitations, risks, security and privacy concerns of performing psychotherapy and management service by telephone and the availability of in person appointments. I also discussed with the patient that there may be a patient responsible charge related to this service. The patient expressed understanding and agreed to proceed. I discussed the treatment planning with the patient. The patient was provided an opportunity to ask questions and all were answered. The patient agreed with the plan and demonstrated an understanding of the instructions. The patient was advised to call  our office if  symptoms worsen or feel they are in a crisis state and need immediate contact.  Therapist Location: [name] Patient Location: home    Reported Symptoms:  "Anxiety, sadness, irritable, erratic sleep at times, feelings that change sometimes, fears the dark and large groups and fear of eating too much food, fear of conflict, fear of disappointing others whom I love   Mental Status Exam:  Appearance:   n/a    (telehealth)  Behavior:  Sharing and Rigid  Motor:  n/a    telehealth  Speech/Language:   talking almost non-stop but conversational tone, not manic  Affect:  n/a    telehealth  Mood:  anxious  Thought process:  circumstantial  Thought content:    Some obsessive thoughts and ruuminating  Sensory/Perceptual disturbances:    WNL  Orientation:  oriented to person, place, time/date, situation, day of week, month of year and year   Attention:  Good  Concentration:  Good  Memory:  Immediate;   Fair Recent;   Fair "fair" per patient  Fund of knowledge:   Fair  Insight:    Fair  Judgment:   Fair  Impulse Control:  Fair   Risk Assessment: Danger to Self:  No Self-injurious Behavior: No Danger to Others: No   Duty to Warn:no Physical Aggression / Violence:No  Access to Firearms a concern: No  Gang Involvement:No    Any changes in medications since last therapy appointment: Patient reports no changes in her meds since last therapy appt with prior therapist.  Any new health concerns since last therapy appointment: Patient reports no additional health concerns since last therapy appt.  Subjective:    Patient being transferred to this therapist from prior therapist due to insurance coverage changing. Talks continuously but not manic. Actually give me some good information in trying to help patient going forward.  Talks about family relationships including husband [name], mother-in-law, mother, dad is deceased, siblings.  Doesn't really have a "close or best friend" and often feels "less than" when is comes to interacting with others, "even though I was popular in high school."   Talked about goals.  Has been using CBT workbook and we agreed upon as assignment using the CBT book. Patient clarified her assignment and seemed motivated to complete it.  My goals include: I want to be more positive. I want to pray more often for myself. Get back into fitness, do more walking.  Start doing Yoga. Want to have less anger towards people who are not nice to me.  Want to attend church more regularly because that can help me. To get along better with my husband "most of the time"   Interventions: Cognitive Behavioral Therapy, Solution-Oriented/Positive Psychology and Ego-Supportive  Diagnosis:   ICD-10-CM   1. Mixed bipolar I disorder ([place]) F31.60     Plan:   Patient to continue strategies she was already practicing  some to help her manage her emotions in productive ways.  Goal review with patient. To move forward with her assignment with CBT book including notetaking for further discussion next session, especially how this assignment intersects with her goals under Flowsheet section of her chart here.   Shanon Ace, LCSW

## 2018-05-31 ENCOUNTER — Other Ambulatory Visit: Payer: Self-pay

## 2018-05-31 ENCOUNTER — Ambulatory Visit (INDEPENDENT_AMBULATORY_CARE_PROVIDER_SITE_OTHER): Payer: Medicare Other | Admitting: Psychiatry

## 2018-05-31 DIAGNOSIS — F3160 Bipolar disorder, current episode mixed, unspecified: Secondary | ICD-10-CM | POA: Diagnosis not present

## 2018-05-31 NOTE — Progress Notes (Signed)
[facility] Counselor/Therapist Progress Note  Patient ID: Jennifer [name], MRN: [id number],    Date: 05/31/2018  Time Spent: 60 minutes    10:00am to 11:00am  Treatment Type: Individual Therapy   Virtual Visit Note: Connected with patient by a video enabled telemedicine/telehealth application or telephone, with their informed consent, and verified patient privacy and that I am speaking with the correct person using two identifiers. I discussed the limitations, risks, security and privacy concerns of performing psychotherapy and management service by telephone and the availability of in person appointments. I also discussed with the patient that there may be a patient responsible charge related to this service. The patient expressed understanding and agreed to proceed. I discussed the treatment planning with the patient. The patient was provided an opportunity to ask questions and all were answered. The patient agreed with the plan and demonstrated an understanding of the instructions. The patient was advised to call  our office if  symptoms worsen or feel they are in a crisis state and need immediate contact.  Therapist Location: [name] Patient Location: home    Reported Symptoms:  "Anxiety, sadness, irritable, erratic sleep at times, feelings that change sometimes, fears the dark and large groups and fear of eating too much food, fear of conflict, fear of disappointing others whom I love; some physical pain and saw her reg. Doctor who felt "it was just joint pain"   Mental Status Exam:  Appearance:   n/a    (telehealth)  Behavior:  Sharing and Rigid  Motor:  n/a    telehealth  Speech/Language:   talking almost non-stop but conversational tone, not manic  Affect:  n/a    telehealth  Mood:  anxious  Thought process:  circumstantial  Thought content:    Some obsessive thoughts and ruuminating  Sensory/Perceptual disturbances:    WNL  Orientation:  oriented to  person, place, time/date, situation, day of week, month of year and year  Attention:  Good  Concentration:  Good  Memory:  Immediate;   Fair Recent;   Fair "fair" per patient  Fund of knowledge:   Fair  Insight:    Fair  Judgment:   Fair  Impulse Control:  Fair   Risk Assessment: Danger to Self:  No Self-injurious Behavior: No Danger to Others: No   Duty to Warn:no Physical Aggression / Violence:No  Access to Firearms a concern: No  Gang Involvement:No    Any changes in medications since last therapy appointment: Patient reports no changes in her meds since last therapy appt with prior therapist.  Any new health concerns since last therapy appointment: Patient reports no additional health concerns since last therapy appt. ("except some more joint pain")  Subjective:   Admits today "I have a hard time focusing and my mind just works fast."  Did follow through on her homework in CBT book from last session.  Re-took the depression test and scored higher this time which" places her in moderate to severe category for depression".  Also reviewed the part on anxiety and phobias, and reports "she was all over the place because so many question related to her, especially generalized anxiety and specific phobia". " Seems like I have so many of all those traits and I don't know why I've been in therapy for so long?"  She processed this and quickly shifted to wanting to talk about her sleep issues, and then veered off to going back into the past a lot, then said "  I dont know where to start".  Suggested for now, we focus on the present and moving forward as this is where she can have more control and make changes. Explored this along with the next part of CBT book, which will be her next homework segment ( pg 49 Week one).  Tried to work some with patient on focusing and re-focusing---very difficult for patient.  Talked about goals.  Has been using CBT workbook and we agreed upon an assignments  using the CBT book. Patient clarified her assignment and seemed motivated to complete it.   *(Reviewing these each session for now.) Patient goals include: I want to be more positive. I want to pray more often for myself. Get back into fitness, do more walking.  Start doing Yoga. Want to have less anger towards people who are not nice to me. Want to attend church more regularly because that can help me. To get along better with my husband "most of the time"   Interventions: Cognitive Behavioral Therapy, Solution-Oriented/Positive Psychology and Ego-Supportive  Diagnosis:   ICD-10-CM   1. Mixed bipolar I disorder ([place]) F31.60     Plan:   Patient to continue strategies she was already practicing some to help her manage her emotions in productive ways.  Goal review with patient. To move forward with her assignment with CBT book including notetaking for further discussion next session, especially how this assignment intersects with her goals under Flowsheet section of her chart here.   Shanon Ace, LCSW

## 2018-06-02 ENCOUNTER — Ambulatory Visit: Payer: BC Managed Care – PPO | Admitting: Psychiatry

## 2018-06-08 ENCOUNTER — Encounter: Payer: Self-pay | Admitting: Physician Assistant

## 2018-06-08 ENCOUNTER — Other Ambulatory Visit: Payer: Self-pay

## 2018-06-08 ENCOUNTER — Ambulatory Visit (INDEPENDENT_AMBULATORY_CARE_PROVIDER_SITE_OTHER): Payer: Medicare Other | Admitting: Physician Assistant

## 2018-06-08 DIAGNOSIS — F411 Generalized anxiety disorder: Secondary | ICD-10-CM | POA: Diagnosis not present

## 2018-06-08 DIAGNOSIS — Z79899 Other long term (current) drug therapy: Secondary | ICD-10-CM

## 2018-06-08 DIAGNOSIS — F3160 Bipolar disorder, current episode mixed, unspecified: Secondary | ICD-10-CM

## 2018-06-08 DIAGNOSIS — F422 Mixed obsessional thoughts and acts: Secondary | ICD-10-CM | POA: Diagnosis not present

## 2018-06-08 DIAGNOSIS — F509 Eating disorder, unspecified: Secondary | ICD-10-CM

## 2018-06-08 DIAGNOSIS — G4700 Insomnia, unspecified: Secondary | ICD-10-CM

## 2018-06-08 NOTE — Progress Notes (Signed)
[facility] Med Check  Patient ID: Jennifer [name],  MRN: [id number]  PCP: Lennie Odor, PA-C  Date of Evaluation: 06/08/2018 Time spent:15 minutes  Chief Complaint:  Chief Complaint    Depression     Virtual Visit via Telephone Note  I connected with patient by a video enabled telemedicine application or telephone, with their informed consent, and verified patient privacy and that I am speaking with the correct person using two identifiers.  I am private, in my home and the patient is home.  I discussed the limitations, risks, security and privacy concerns of performing an evaluation and management service by telephone and the availability of in person appointments. I also discussed with the patient that there may be a patient responsible charge related to this service. The patient expressed understanding and agreed to proceed.   I discussed the assessment and treatment plan with the patient. The patient was provided an opportunity to ask questions and all were answered. The patient agreed with the plan and demonstrated an understanding of the instructions.   The patient was advised to call back or seek an in-person evaluation if the symptoms worsen or if the condition fails to improve as anticipated.  I provided 15 minutes of non-face-to-face time during this encounter.  HISTORY/CURRENT STATUS: HPI For routine med check but not feeling well.  Not staying asleep.  Usually only gets about 4 hours.  Is having racing thoughts, mood swings are bad right now, from highs to lows in a few hours, started about 2-3 weeks ago.  "I don't know what's wrong with me."  Crying, saying that the only thing that has ever helped is the Anafranil.  But was taken off that when she was hospitalized in 2017, "because it made my liver start to fail.  They told me I could never take that again."  States she is tired of changing medicines or increasing things.  Denies suicidal or homicidal thoughts.  Her  energy and motivation are low.  "I was fine until about 2 weeks ago when all of a sudden I started feeling really depressed again.  I am so tired of feeling like this.  I have not been wanting to do so or do anything that I used to do."  Patient denies increased energy with decreased need for sleep, no increased talkativeness, no racing thoughts, no impulsivity or risky behaviors, no increased spending, no increased libido, no grandiosity.  Denies dizziness, syncope, seizures, numbness, tingling, tremor, tics, unsteady gait, slurred speech, confusion.  She does complain of generalized muscle aches and pains that come and go and are not associated with anything in particular.  Individual Medical History/ Review of Systems: Changes? :No    Past medications for mental health diagnoses include: Risperdal, Seroquel, Prozac, Zoloft, Latuda, Wellbutrin, Lamictal, Depakote, Xanax, Ambien, trazodone, Trileptal, Luvox, Topamax, Elavil, Pamelor, BuSpar, doxazosin, prazosin, lithium, Vraylar, [name]  Allergies: Codeine; Sonata [zaleplon]; Meloxicam; Other; and Penicillins  Current Medications:  Current Outpatient Medications:  .  clonazePAM (KLONOPIN) 0.5 MG tablet, Take 1 tablet (0.5 mg total) by mouth 2 (two) times daily as needed for anxiety., Disp: 20 tablet, Rfl: 1 .  fluvoxaMINE (LUVOX) 100 MG tablet, Take 3 po qd, Disp: 270 tablet, Rfl: 1 .  gabapentin (NEURONTIN) 600 MG tablet, Take 1 tablet (600 mg total) by mouth 4 (four) times daily., Disp: 120 tablet, Rfl: 5 .  hydrOXYzine (ATARAX/VISTARIL) 50 MG tablet, 1-2 po q6hr prn, Disp: 90 tablet, Rfl: 5 .  lurasidone (LATUDA) 40 MG  TABS tablet, Take 1 tablet (40 mg total) by mouth daily with breakfast. Must take with food., Disp: 30 tablet, Rfl: 5 .  traZODone (DESYREL) 100 MG tablet, Take 1-2 tablets (100-200 mg total) by mouth at bedtime as needed for sleep., Disp: 60 tablet, Rfl: 5 Medication Side Effects: none  Family Medical/ Social History:  Changes? No  MENTAL HEALTH EXAM:  Last menstrual period 10/07/2011.There is no height or weight on file to calculate BMI.  General Appearance: unable to assess  Eye Contact:  unable to assess  Speech:  Clear and Coherent  Volume:  Normal  Mood:  Depressed  Affect:  unable to assess  Thought Process:  Goal Directed  Orientation:  Full (Time, Place, and Person)  Thought Content: Logical   Suicidal Thoughts:  No  Homicidal Thoughts:  No  Memory:  WNL  Judgement:  Good  Insight:  Good  Psychomotor Activity:  unable to assess  Concentration:  Concentration: Fair  Recall:  Good  Fund of Knowledge: Good  Language: Good  Assets:  Desire for Improvement  ADL's:  Intact  Cognition: WNL  Prognosis:  Good    DIAGNOSES:    ICD-10-CM   1. Mixed bipolar I disorder (H[facility]) F31.60   2. Encounter for long-term (current) use of medications Z79.899 Comprehensive metabolic panel    CBC with Differential/Platelet    Lipid panel    Hemoglobin A1c  3. Generalized anxiety disorder F41.1   4. Mixed obsessional thoughts and acts F42.2   5. Insomnia, unspecified type G47.00   6. Eating disorder, unspecified type F50.9     Receiving Psychotherapy: Yes Rinaldo Cloud, LCSW   RECOMMENDATIONS:  Continue Klonopin 0.5 mg twice daily as needed. She does not take it very often and for now, I recommend she use that in the evening hoping to help her relax and go to sleep. Discussed sleep hygiene. Continue Luvox 100 mg 3 p.o. nightly. Continue gabapentin 600 mg 1 4 times daily. Continue Latuda 40 mg daily with food. Continue trazodone 100 to 200 mg nightly as needed. We discussed the Anafranil.  When her labs were redrawn last fall she had a slight bump in the AST and ALT.  I recommend we redraw labs and depending on what they show, we may be able to restart the Anafranil.  I will discuss with Dr. Clovis Pu before making that change. Continue psychotherapy with Rinaldo Cloud, LCSW.  She has an appointment  already scheduled with me in a couple of weeks so we will keep that.  [name], PA-C   This record has been created using [employer].  Chart creation errors have been sought, but may not always have been located and corrected. Such creation errors do not reflect on the standard of medical care.

## 2018-06-15 ENCOUNTER — Ambulatory Visit (INDEPENDENT_AMBULATORY_CARE_PROVIDER_SITE_OTHER): Payer: Medicare Other | Admitting: Psychiatry

## 2018-06-15 ENCOUNTER — Other Ambulatory Visit: Payer: Self-pay

## 2018-06-15 DIAGNOSIS — F3160 Bipolar disorder, current episode mixed, unspecified: Secondary | ICD-10-CM

## 2018-06-15 NOTE — Progress Notes (Signed)
[facility] Counselor/Therapist Progress Note  Patient ID: Jennifer [name], MRN: [id number],    Date: 06/15/2018  Time Spent: 60 minutes    11:00am to 12:00noon  Treatment Type: Individual Therapy   Virtual Visit Note: Connected with patient by a video enabled telemedicine/telehealth application or telephone, with their informed consent, and verified patient privacy and that I am speaking with the correct person using two identifiers. I discussed the limitations, risks, security and privacy concerns of performing psychotherapy and management service by telephone and the availability of in person appointments. I also discussed with the patient that there may be a patient responsible charge related to this service. The patient expressed understanding and agreed to proceed. I discussed the treatment planning with the patient. The patient was provided an opportunity to ask questions and all were answered. The patient agreed with the plan and demonstrated an understanding of the instructions. The patient was advised to call  our office if  symptoms worsen or feel they are in a crisis state and need immediate contact.  Therapist Location: [name] Patient Location: home   Reported Symptoms:  Anxiety, some depression and tearfulness, OCD, some moodiness, fearfulness re: situations with neighbors, sadness "at times"   Mental Status Exam:  Appearance:   n/a    (telehealth)  Behavior:  Sharing and Rigid  Motor:  n/a    telehealth  Speech/Language:   talking almost non-stop but conversational tone, not manic  Affect:  n/a    telehealth  Mood:  anxious  Thought process:  circumstantial  Thought content:    Some obsessive thoughts and ruuminating  Sensory/Perceptual disturbances:    WNL  Orientation:  oriented to person, place, time/date, situation, day of week, month of year and year  Attention:  Good  Concentration:  Good  Memory:  Immediate;   Fair Recent;   Fair "fair"  per patient  Fund of knowledge:   Fair  Insight:    Fair  Judgment:   Fair  Impulse Control:  Fair   Risk Assessment: Danger to Self:  No Self-injurious Behavior: No Danger to Others: No   Duty to Warn:no Physical Aggression / Violence:No  Access to Firearms a concern: No  Gang Involvement:No    Any changes in medications since last therapy appointment: Patient reports no changes in her meds since last therapy appt with prior therapist.  Any new health concerns since last therapy appointment: Patient reports no additional health concerns since last therapy appt.  Subjective:   Hard time focusing today and moves from subject to subject.  Emotionally volatile with some tearfulness, frustration with self and "friends/relatives", difficulty focusing and following through as she speaks which she says "this is just how I am, my mind works."  Hospital doctor lately that I am a better friend to others than they are to me". Processed issues she's had within past few weeks with mother- in-law, especially over a recent bitter conversation.  States she cried and cried, and explained that "when I cry short periods of time about 10-15 minutes it helps" but "when I cry a longer time of an hour or so then it's not as helpful."     Followed up on her homework in CBT book from last session.  States that is benefits her as "I see my history and understand it better, and I realized things I want to do and not do for myself."  Also "I realized I am in God's care and need to stop  my panic".  This seemed to be reassuring to patient and we discussed how she can apply these beliefs when she is feeling more anxious.    Suggested again that for now, we focus on the present and moving forward as this is where she can have more control and make changes. Explored this along with the next part of CBT book, which will be her next homework segment ( to pag. 83, Week Two).  Tried to work some with patient on focusing and  re-focusing---very difficult for patient. Talked about goals.  Has been using CBT workbook and we agreed upon an assignments using the CBT book. Patient clarified her assignment and seemed motivated to complete it.  *(Reviewing these each session for now.) Patient goals include: (in addition to Goals on Flowsheet added and discussed 06/15/2018: I want to be more positive. I want to pray more often for myself. Get back into fitness, do more walking.  Start doing Yoga. Want to have less anger towards people who are not nice to me. Want to attend church more regularly because that can help me. To get along better with my husband "most of the time"   Interventions: Cognitive Behavioral Therapy, Solution-Oriented/Positive Psychology and Ego-Supportive  Diagnosis:   ICD-10-CM   1. Mixed bipolar I disorder ([place]) F31.60     Plan:   Patient to continue strategies she was already practicing some to help her manage her emotions in productive ways.  Goal review with patient. To move forward with her assignment with CBT book including notetaking for further discussion next session, especially how this assignment intersects with her goals under Flowsheet section of her chart here.   Shanon Ace, LCSW

## 2018-06-16 ENCOUNTER — Ambulatory Visit: Payer: BC Managed Care – PPO | Admitting: Psychiatry

## 2018-06-23 ENCOUNTER — Ambulatory Visit: Payer: Medicare Other | Admitting: Psychiatry

## 2018-06-28 ENCOUNTER — Ambulatory Visit: Payer: Medicare Other | Admitting: Psychiatry

## 2018-06-28 ENCOUNTER — Other Ambulatory Visit: Payer: Self-pay

## 2018-06-28 DIAGNOSIS — F3160 Bipolar disorder, current episode mixed, unspecified: Secondary | ICD-10-CM | POA: Diagnosis not present

## 2018-06-28 NOTE — Progress Notes (Signed)
[facility] Counselor/Therapist Progress Note  Patient ID: Jennifer [name], MRN: [id number],    Date: 06/28/2018  Time Spent: 60 minutes    10:00am to 11:00am  Treatment Type: Individual Therapy   Reported Symptoms:  Depression and sadness has been the strongest, Anxiety   Mental Status Exam:  Appearance:   Casual, neat  Behavior:  Sharing and Rigid  Motor:  n/a    telehealth  Speech/Language:   talking almost non-stop but conversational tone, not manic  Affect:  anxious  Mood:  anxious  Thought process:  circumstantial  Thought content:    Some obsessive thoughts and ruuminating  Sensory/Perceptual disturbances:    WNL  Orientation:  oriented to person, place, time/date, situation, day of week, month of year and year  Attention:  Good  Concentration:  Good  Memory:  Immediate;   Fair Recent;   Fair "fair" per patient  Fund of knowledge:   Fair  Insight:    Fair  Judgment:   Fair  Impulse Control:  Fair   Risk Assessment: Danger to Self:  No Self-injurious Behavior: No Danger to Others: No   Duty to Warn:no Physical Aggression / Violence:No  Access to Firearms a concern: No  Gang Involvement:No    Any changes in medications since last therapy appointment: Patient reports no changes in her meds since last therapy appt with prior therapist.  Any new health concerns since last therapy appointment: Patient reports no additional health concerns since last therapy appt.  Subjective:   Patient still having a difficult time focusing today and moves from subject to subject.  Reports her "worse symptoms this past couple weeks have been depression and sadness."  Shared her journaling that involved family dynamics, and how patient is feeling and thinking, and other issues that impact her.  Today states her biggest concern today is "my depression and sadness" about "when I my mom dies I won't have anybody except my husband to support me."  I asked about her mom's current  health and she responded that mom is currently fine. Very consumed today with these thoughts of how bad it will be for her when her mom is no longer here on earth.  Reports that she and mom don't always get along well, and feels mom doesn't treat her as well as her sister and some other family members. Talked about some strategies/activities she can use when she gets so consumed with these thoughts and feelings: going out for a walk, sewing, calling my friend, journaling.    Processed her homework (CBT) from last visit. Added to her coping list: wants to read her Bible every day, walk at least 20 minutes daily, sew some each day as she enjoys being creative. Discusses how these 3 activities help her.  "I sometimes see myself as a perfectionist, OCD, type person.  Strong tendency to dwell on past or perceived future events which all tend to be negative.  Is very open in talking about this and responds well to redirection towards being proactive versus always feeling she has to be reactive and doesn't have power to make good things happen.  Worked also on staying more in the Present versus past to too far into future imagining bad things will happen.     Talked about goals.  Has been using CBT workbook and we agreed upon an assignments using the CBT book. Patient clarified her assignment and seemed motivated to complete it.  *(Reviewing these each session for now.)  Patient goals include: (in addition to Goals on Flowsheet added and discussed 06/15/2018: I want to be more positive. I want to pray more often for myself. Get back into fitness, do more walking.  Start doing Yoga. Want to have less anger towards people who are not nice to me. Want to attend church more regularly because that can help me. To get along better with my husband "most of the time"   Interventions: Cognitive Behavioral Therapy, Solution-Oriented/Positive Psychology and Ego-Supportive  Diagnosis:   ICD-10-CM   1. Mixed bipolar  I disorder ([place]) F31.60     Plan:   Work to stay in the Present rather than past or future. Patient to continue strategies she was already practicing some to help her manage her emotions in productive ways.  Goal review with patient. To move forward with her assignment with CBT book including notetaking for further discussion next session, especially how this assignment intersects with her goals under Flowsheet section of her chart here.   Shanon Ace, LCSW

## 2018-06-30 ENCOUNTER — Other Ambulatory Visit: Payer: Self-pay

## 2018-06-30 ENCOUNTER — Encounter: Payer: Self-pay | Admitting: Physician Assistant

## 2018-06-30 ENCOUNTER — Ambulatory Visit: Payer: Medicare Other | Admitting: Psychiatry

## 2018-06-30 ENCOUNTER — Ambulatory Visit (INDEPENDENT_AMBULATORY_CARE_PROVIDER_SITE_OTHER): Payer: Medicare Other | Admitting: Physician Assistant

## 2018-06-30 DIAGNOSIS — F411 Generalized anxiety disorder: Secondary | ICD-10-CM | POA: Diagnosis not present

## 2018-06-30 DIAGNOSIS — G4700 Insomnia, unspecified: Secondary | ICD-10-CM

## 2018-06-30 DIAGNOSIS — F422 Mixed obsessional thoughts and acts: Secondary | ICD-10-CM | POA: Diagnosis not present

## 2018-06-30 DIAGNOSIS — F3160 Bipolar disorder, current episode mixed, unspecified: Secondary | ICD-10-CM

## 2018-06-30 DIAGNOSIS — F509 Eating disorder, unspecified: Secondary | ICD-10-CM

## 2018-06-30 NOTE — Progress Notes (Signed)
[facility] Check  Patient ID: Jennifer [name],  MRN: [id number]  PCP: Lennie Odor, PA-C  Date of Evaluation: 06/30/2018 Time spent:15 minutes  Chief Complaint:  Chief Complaint    Depression; Anxiety; Follow-up     Virtual Visit via Telephone Note  I connected with patient by a video enabled telemedicine application or telephone, with their informed consent, and verified patient privacy and that I am speaking with the correct person using two identifiers.  I am private, in my ofice and the patient is home.   I discussed the limitations, risks, security and privacy concerns of performing an evaluation and management service by telephone and the availability of in person appointments. I also discussed with the patient that there may be a patient responsible charge related to this service. The patient expressed understanding and agreed to proceed.   I discussed the assessment and treatment plan with the patient. The patient was provided an opportunity to ask questions and all were answered. The patient agreed with the plan and demonstrated an understanding of the instructions.   The patient was advised to call back or seek an in-person evaluation if the symptoms worsen or if the condition fails to improve as anticipated.  I provided 15 minutes of non-face-to-face time during this encounter.  HISTORY/CURRENT STATUS: HPI For routine med check.  Hasn't had her labs done.  States she will feel comfortable going back on the Anafranil whether her liver tests are normal or not.  She will always feel that it caused liver problems in the past, that is what she was told when she was in behavioral health a few years ago when they took her off of it.  States she will get her blood work drawn though because she knows we need the blood sugar and cholesterol panel.  For the most part, she is doing pretty good.  2 or 3 days ago she did purge once.  Then her stomach started hurting yesterday.   She is also realized that she has not been taking the sucralfate 3 times a day like she was supposed to.  She has increased the dose from once a day to 3 times a day and will see if that makes her feel better.  Patient denies loss of interest in usual activities and is able to enjoy things.  Denies decreased energy or motivation.  Appetite has not changed.  Denies any changes in concentration, making decisions or remembering things.  In fact she thinks that maybe she has been able to understand things a little bit better and remember them.  "Not a lot but a little."  She does feel sad about everything that is going on with the coronavirus and racial tension in the country but does not have extreme hopelessness.  Denies suicidal or homicidal thoughts.  Patient denies increased energy with decreased need for sleep, no increased talkativeness, no racing thoughts, no impulsivity or risky behaviors, no increased spending, no increased libido, no grandiosity.  Denies dizziness, syncope, seizures, numbness, tingling, tremor, tics, unsteady gait, slurred speech, confusion. Denies muscle or joint pain, stiffness, or dystonia.  Individual Medical History/ Review of Systems: Changes? :Yes She will be starting Emgality soon for migraine prevention.  Past medications for mental health diagnoses include: Risperdal, Seroquel, Prozac, Zoloft, Latuda, Wellbutrin, Lamictal, Depakote, Xanax, Ambien, trazodone, Trileptal, Luvox, Topamax, Elavil, Pamelor, BuSpar, doxazosin, prazosin, lithium, Vraylar, [name]  Allergies: Codeine, Sonata [zaleplon], Meloxicam, Other, and Penicillins  Current Medications:  Current Outpatient Medications:  .  clonazePAM (  KLONOPIN) 0.5 MG tablet, Take 1 tablet (0.5 mg total) by mouth 2 (two) times daily as needed for anxiety., Disp: 20 tablet, Rfl: 1 .  fluvoxaMINE (LUVOX) 100 MG tablet, Take 3 po qd, Disp: 270 tablet, Rfl: 1 .  gabapentin (NEURONTIN) 600 MG tablet, Take 1 tablet (600 mg  total) by mouth 4 (four) times daily., Disp: 120 tablet, Rfl: 5 .  Galcanezumab-gnlm (EMGALITY [place]), Inject into the skin., Disp: , Rfl:  .  hydrOXYzine (ATARAX/VISTARIL) 50 MG tablet, 1-2 po q6hr prn, Disp: 90 tablet, Rfl: 5 .  lurasidone (LATUDA) 40 MG TABS tablet, Take 1 tablet (40 mg total) by mouth daily with breakfast. Must take with food., Disp: 30 tablet, Rfl: 5 .  traZODone (DESYREL) 100 MG tablet, Take 1-2 tablets (100-200 mg total) by mouth at bedtime as needed for sleep., Disp: 60 tablet, Rfl: 5 .  omeprazole (PRILOSEC) 40 MG capsule, , Disp: , Rfl:  .  valACYclovir (VALTREX) 500 MG tablet, , Disp: , Rfl:  Medication Side Effects: none  Family Medical/ Social History: Changes? No  MENTAL HEALTH EXAM:  Last menstrual period 10/07/2011.There is no height or weight on file to calculate BMI.  General Appearance: unable to assess  Eye Contact:  unable to assess  Speech:  Clear and Coherent  Volume:  Normal  Mood:  Euthymic  Affect:  unable to assess  Thought Process:  Goal Directed  Orientation:  Full (Time, Place, and Person)  Thought Content: Logical   Suicidal Thoughts:  No  Homicidal Thoughts:  No  Memory:  WNL  Judgement:  Good  Insight:  Good  Psychomotor Activity:  unable to assess  Concentration:  Concentration: Good and Attention Span: Good  Recall:  Good  Fund of Knowledge: Good  Language: Good  Assets:  Desire for Improvement  ADL's:  Intact  Cognition: WNL  Prognosis:  Good    DIAGNOSES:    ICD-10-CM   1. Mixed bipolar I disorder (HCC)  F31.60   2. Mixed obsessional thoughts and acts  F42.2   3. Insomnia, unspecified type  G47.00   4. Generalized anxiety disorder  F41.1   5. Eating disorder, unspecified type  F50.9     Receiving Psychotherapy: Yes With Rinaldo Cloud, LCSW   RECOMMENDATIONS:  She will get her labs drawn within the next week. Continue Luvox 300 mg p.o. nightly. Continue Klonopin 0.5 mg twice daily.  PDMP was reviewed. Continue  gabapentin 600 mg 4 times daily. Continue hydroxyzine 50 mg 4 times daily as needed. Continue Latuda 40 mg daily with food. Continue trazodone 100 mg, 1-2 nightly as needed. Continue psychotherapy with Rinaldo Cloud, LCSW. Return in 6 weeks.  [name], PA-C   This record has been created using [employer].  Chart creation errors have been sought, but may not always have been located and corrected. Such creation errors do not reflect on the standard of medical care.

## 2018-07-07 ENCOUNTER — Other Ambulatory Visit: Payer: Self-pay

## 2018-07-07 ENCOUNTER — Encounter (HOSPITAL_COMMUNITY): Payer: Self-pay

## 2018-07-07 ENCOUNTER — Emergency Department (HOSPITAL_COMMUNITY)
Admission: EM | Admit: 2018-07-07 | Discharge: 2018-07-07 | Disposition: A | Discharge status: Home / Self Care | Payer: Medicare Other | Attending: Emergency Medicine | Admitting: Emergency Medicine

## 2018-07-07 ENCOUNTER — Emergency Department (HOSPITAL_COMMUNITY): Payer: Medicare Other

## 2018-07-07 DIAGNOSIS — R1084 Generalized abdominal pain: Secondary | ICD-10-CM

## 2018-07-07 DIAGNOSIS — F17210 Nicotine dependence, cigarettes, uncomplicated: Secondary | ICD-10-CM | POA: Diagnosis not present

## 2018-07-07 DIAGNOSIS — R1013 Epigastric pain: Secondary | ICD-10-CM | POA: Diagnosis present

## 2018-07-07 LAB — POC URINE PREG, ED: Preg Test, Ur: NEGATIVE

## 2018-07-07 LAB — CBC
HCT: 43.1 % (ref 36.0–46.0)
Hemoglobin: 14.2 g/dL (ref 12.0–15.0)
MCH: 29.2 pg (ref 26.0–34.0)
MCHC: 32.9 g/dL (ref 30.0–36.0)
MCV: 88.5 fL (ref 80.0–100.0)
Platelets: 205 10*3/uL (ref 150–400)
RBC: 4.87 MIL/uL (ref 3.87–5.11)
RDW: 13.6 % (ref 11.5–15.5)
WBC: 7.1 10*3/uL (ref 4.0–10.5)
nRBC: 0 % (ref 0.0–0.2)

## 2018-07-07 LAB — COMPREHENSIVE METABOLIC PANEL
ALT: 37 U/L (ref 0–44)
AST: 34 U/L (ref 15–41)
Albumin: 4.3 g/dL (ref 3.5–5.0)
Alkaline Phosphatase: 82 U/L (ref 38–126)
Anion gap: 13 (ref 5–15)
BUN: 15 mg/dL (ref 6–20)
CO2: 24 mmol/L (ref 22–32)
Calcium: 9.5 mg/dL (ref 8.9–10.3)
Chloride: 100 mmol/L (ref 98–111)
Creatinine, Ser: 0.86 mg/dL (ref 0.44–1.00)
GFR calc Af Amer: >60 mL/min (ref >60)
GFR calc non Af Amer: >60 mL/min (ref >60)
Glucose, Bld: 101 mg/dL — ABNORMAL HIGH (ref 70–99)
Potassium: 3.9 mmol/L (ref 3.5–5.1)
Sodium: 137 mmol/L (ref 135–145)
Total Bilirubin: 0.3 mg/dL (ref 0.3–1.2)
Total Protein: 7 g/dL (ref 6.5–8.1)

## 2018-07-07 LAB — URINALYSIS, ROUTINE W REFLEX MICROSCOPIC
Bacteria, UA: NONE SEEN
Bilirubin Urine: NEGATIVE
Glucose, UA: NEGATIVE mg/dL
Hgb urine dipstick: NEGATIVE
Ketones, ur: NEGATIVE mg/dL
Leukocytes,Ua: NEGATIVE
Nitrite: NEGATIVE
Protein, ur: NEGATIVE mg/dL
Specific Gravity, Urine: 1 — ABNORMAL LOW (ref 1.005–1.030)
pH: 5 (ref 5.0–8.0)

## 2018-07-07 LAB — LIPASE, BLOOD: Lipase: 49 U/L (ref 11–51)

## 2018-07-07 LAB — TROPONIN I: Troponin I: <0.03 ng/mL (ref <0.03)

## 2018-07-07 MED ORDER — DICYCLOMINE HCL 20 MG PO TABS: 20.0000 mg | ORAL_TABLET | Freq: Three times a day (TID) | ORAL | 0 refills | Status: DC | PRN

## 2018-07-07 MED ORDER — ONDANSETRON HCL 4 MG/2ML IJ SOLN
4.0000 mg | Freq: Once | INTRAMUSCULAR | Status: AC
  Administered 2018-07-07: 4 mg via INTRAVENOUS
  Filled 2018-07-07: qty 2

## 2018-07-07 MED ORDER — SODIUM CHLORIDE 0.9% FLUSH
3.0000 mL | Freq: Once | INTRAVENOUS | Status: AC
  Administered 2018-07-07: 3 mL via INTRAVENOUS

## 2018-07-07 MED ORDER — ALUM & MAG HYDROXIDE-SIMETH 200-200-20 MG/5ML PO SUSP
15.0000 mL | Freq: Once | ORAL | Status: AC
  Administered 2018-07-07: 15 mL via ORAL
  Filled 2018-07-07: qty 30

## 2018-07-07 MED ORDER — IOHEXOL 300 MG/ML  SOLN
100.0000 mL | Freq: Once | INTRAMUSCULAR | Status: AC | PRN
  Administered 2018-07-07: 100 mL via INTRAVENOUS

## 2018-07-07 MED ORDER — SODIUM CHLORIDE 0.9 % IV BOLUS
500.0000 mL | Freq: Once | INTRAVENOUS | Status: AC
  Administered 2018-07-07: 500 mL via INTRAVENOUS

## 2018-07-07 MED ORDER — DICYCLOMINE HCL 10 MG PO CAPS
10.0000 mg | ORAL_CAPSULE | Freq: Once | ORAL | Status: AC
  Administered 2018-07-07: 10 mg via ORAL
  Filled 2018-07-07: qty 1

## 2018-07-07 NOTE — ED Provider Notes (Signed)
Emergency Department Provider Note   I have reviewed the triage vital signs and the nursing notes.   HISTORY  Chief Complaint Abdominal Pain   HPI Jennifer [name] is a 53 y.o. female with PMH of Bipolar disorder and bulemia presents to the emergency department for evaluation of epigastric abdominal pain which has become increasingly severe.  She had vomiting last night which was not induced.  She has been following with gastroenterology but was unable to get a appointment until late June.  Patient states that she has a known hiatal hernia and has had pain related to this before but this is more severe today.  She denies fevers or chills.  No blood in the vomit.  No blood in the bowel movements.  No associated diarrhea.  She has been compliant with her medications including medication for reflux.  She took that along with trazodone this morning with no relief in symptoms so presented to the emergency department.  Denies radiation of pain up into the chest or shortness of breath. No other modifying factors.   Past Medical History:  Diagnosis Date  . Binge-eating and purging type anorexia nervosa   . Bipolar disorder ([place])   . Cystitis, interstitial   . OCD (obsessive compulsive disorder)     Patient Active Problem List   Diagnosis Date Noted  . Generalized anxiety disorder 09/27/2017  . Mixed bipolar I disorder ([place]) 12/05/2015  . OCD (obsessive compulsive disorder) 12/04/2015  . Bipolar affective disorder ([place]) 12/04/2015  . Bipolar affective disorder, current episode manic without psychotic symptoms ([place]) 12/04/2015  . Palpitations 05/25/2014  . Chest pain 05/25/2014  . Cellulitis and abscess of buttock 02/15/2013    Past Surgical History:  Procedure Laterality Date  . BLADDER SURGERY    . CHOLECYSTECTOMY    . VOCAL CORD LATERALIZATION, ENDOSCOPIC APPROACH W/ MLB      Allergies Codeine, Sonata [zaleplon], Meloxicam, Other, and Penicillins  No family history on file.   Social History Social History   Tobacco Use  . Smoking status: Current Every Day Smoker    Packs/day: 0.50    Types: Cigarettes    Start date: 01/20/1988    Last attempt to quit: 05/15/2016    Years since quitting: 2.1  . Smokeless tobacco: Never Used  Substance Use Topics  . Alcohol use: No    Alcohol/week: 0.0 standard drinks  . Drug use: No    Review of Systems  Constitutional: No fever/chills Eyes: No visual changes. ENT: No sore throat. Cardiovascular: Denies chest pain. Respiratory: Denies shortness of breath. Gastrointestinal: Positive epigastric abdominal pain. Positive nausea and vomiting.  No diarrhea.  No constipation. Genitourinary: Negative for dysuria. Musculoskeletal: Negative for back pain. Skin: Negative for rash. Neurological: Negative for headaches, focal weakness or numbness.  10-point ROS otherwise negative.  ____________________________________________   PHYSICAL EXAM:  VITAL SIGNS: ED Triage Vitals  Enc Vitals Group     BP 07/07/18 0830 (!) 117/92     Pulse Rate 07/07/18 0830 80     Resp 07/07/18 0830 16     Temp 07/07/18 0830 98.3 F (36.8 C)     Temp Source 07/07/18 0830 Oral     SpO2 --      Weight 07/07/18 0831 175 lb (79.4 kg)     Height 07/07/18 0831 5\' 4"  (1.626 m)   Constitutional: Alert and oriented. Well appearing and in no acute distress. Eyes: Conjunctivae are normal.  Head: Atraumatic. Nose: No congestion/rhinnorhea. Mouth/Throat: Mucous membranes are moist.  Neck: No stridor.  Cardiovascular: Normal rate, regular rhythm. Good peripheral circulation. Grossly normal heart sounds.   Respiratory: Normal respiratory effort.  No retractions. Lungs CTAB. Gastrointestinal: Soft with focal epigastric and RUQ tenderness. No rebound or guarding. No distention.  Musculoskeletal: No lower extremity tenderness nor edema. No gross deformities of extremities. Neurologic:  Normal speech and language. No gross focal neurologic deficits are  appreciated.  Skin:  Skin is warm, dry and intact. No rash noted.  ____________________________________________   LABS (all labs ordered are listed, but only abnormal results are displayed)  Labs Reviewed  COMPREHENSIVE METABOLIC PANEL - Abnormal; Notable for the following components:      Result Value   Glucose, Bld 101 (*)    All other components within normal limits  URINALYSIS, ROUTINE W REFLEX MICROSCOPIC - Abnormal; Notable for the following components:   APPearance TURBID (*)    Specific Gravity, Urine 1.000 (*)    All other components within normal limits  LIPASE, BLOOD  CBC  TROPONIN I  POC URINE PREG, ED   ____________________________________________  EKG   EKG Interpretation  Date/Time:  Thursday July 07 2018 09:49:02 EDT Ventricular Rate:  70 PR Interval:    QRS Duration: 114 QT Interval:  414 QTC Calculation: 447 R Axis:   66 Text Interpretation:  Sinus rhythm Borderline intraventricular conduction delay Baseline wander in lead(s) V2 No STEMI  Confirmed by Nanda Quinton [phone]) on 07/07/2018 10:03:31 AM       ____________________________________________  RADIOLOGY  Ct Abdomen Pelvis W Contrast  Result Date: 07/07/2018 CLINICAL DATA:  Acute abdominal pain with vomiting. EXAM: CT ABDOMEN AND PELVIS WITH CONTRAST TECHNIQUE: Multidetector CT imaging of the abdomen and pelvis was performed using the standard protocol following bolus administration of intravenous contrast. CONTRAST:  165mL OMNIPAQUE IOHEXOL 300 MG/ML  SOLN COMPARISON:  CT abdomen pelvis 01/18/2017 FINDINGS: Lower chest: Mild scarring in the lung bases. No acute infiltrate or effusion. Moderate hiatal hernia Hepatobiliary: Postop cholecystectomy. No focal liver lesion. No biliary dilatation. Pancreas: Negative Spleen: Negative Adrenals/Urinary Tract: Adrenal glands are unremarkable. Kidneys are normal, without renal calculi, focal lesion, or hydronephrosis. Bladder is unremarkable. Small cyst left  lower pole Stomach/Bowel: Moderately large hiatal hernia. Negative for bowel obstruction. No bowel mass or edema. Normal appendix. Negative for diverticulitis. Vascular/Lymphatic: No significant vascular findings are present. No enlarged abdominal or pelvic lymph nodes. Reproductive: 4 cm fibroid anterior fundus of the uterus. No adnexal mass. Other: No free fluid. Musculoskeletal: No acute abnormality IMPRESSION: 1. No cause for acute abdominal pain 2. Postop cholecystectomy 3. 4 cm uterine fibroid. Electronically Signed   By: Franchot Gallo M.D.   On: 07/07/2018 12:23    ____________________________________________   PROCEDURES  Procedure(s) performed:   Procedures  None  ____________________________________________   INITIAL IMPRESSION / ASSESSMENT AND PLAN / ED COURSE  Pertinent labs & imaging results that were available during my care of the patient were reviewed by me and considered in my medical decision making (see chart for details).   Patient with past medical history of bulimia and bipolar disorder presents with epigastric abdominal pain.  She has mild tenderness in the right upper quadrant and epigastric region.  She has had a cholecystectomy.  She is followed by GI.  Given her focal abdominal findings and pain not managed well at home I do plan for CT imaging to rule out surgical pathology.  Patient's last CT scan was 2 years prior.   CT imaging and labs reviewed.  No acute  findings.  Patient is significantly improved after Bentyl.  Plan to add this to her home regimen and have her follow with both her PCP and gastroenterology.  Patient verbalizes understanding and feels ready for discharge.  ____________________________________________  FINAL CLINICAL IMPRESSION(S) / ED DIAGNOSES  Final diagnoses:  Generalized abdominal pain     MEDICATIONS GIVEN DURING THIS VISIT:  Medications  sodium chloride flush (NS) 0.9 % injection 3 mL (3 mLs Intravenous Given 07/07/18 0928)   sodium chloride 0.9 % bolus 500 mL (0 mLs Intravenous Stopped 07/07/18 1151)  dicyclomine (BENTYL) capsule 10 mg (10 mg Oral Given 07/07/18 0930)  ondansetron (ZOFRAN) injection 4 mg (4 mg Intravenous Given 07/07/18 0930)  alum & mag hydroxide-simeth (MAALOX/MYLANTA) 200-200-20 MG/5ML suspension 15 mL (15 mLs Oral Given 07/07/18 0930)  iohexol (OMNIPAQUE) 300 MG/ML solution 100 mL (100 mLs Intravenous Contrast Given 07/07/18 1115)     NEW OUTPATIENT MEDICATIONS STARTED DURING THIS VISIT:  New Prescriptions   DICYCLOMINE (BENTYL) 20 MG TABLET    Take 1 tablet (20 mg total) by mouth 3 (three) times daily as needed for spasms (abdominal cramping).    Note:  This document was prepared using Dragon voice recognition software and may include unintentional dictation errors.  Nanda Quinton, MD Emergency Medicine    , [facility], MD 07/07/18 1242

## 2018-07-07 NOTE — Discharge Instructions (Signed)

## 2018-07-07 NOTE — ED Triage Notes (Signed)
Pt reports that she ate breakfast this morning and began with severe abdominal pain. Reports vomited last night

## 2018-07-08 ENCOUNTER — Ambulatory Visit
Admission: RE | Admit: 2018-07-08 | Discharge: 2018-07-08 | Disposition: A | Discharge status: Home / Self Care | Payer: Medicare Other | Source: Ambulatory Visit | Attending: Physician Assistant | Admitting: Physician Assistant

## 2018-07-08 ENCOUNTER — Other Ambulatory Visit: Payer: Self-pay | Admitting: Physician Assistant

## 2018-07-08 DIAGNOSIS — M25562 Pain in left knee: Secondary | ICD-10-CM

## 2018-07-11 ENCOUNTER — Other Ambulatory Visit: Payer: Self-pay

## 2018-07-11 DIAGNOSIS — Z20822 Contact with and (suspected) exposure to covid-19: Secondary | ICD-10-CM

## 2018-07-11 NOTE — Progress Notes (Signed)
lab

## 2018-07-12 ENCOUNTER — Other Ambulatory Visit: Payer: Medicare Other

## 2018-07-13 ENCOUNTER — Other Ambulatory Visit: Payer: Medicare Other

## 2018-07-13 ENCOUNTER — Ambulatory Visit: Payer: Self-pay | Admitting: *Deleted

## 2018-07-13 NOTE — Telephone Encounter (Signed)
I went to be tested for COVID-19 and I'm checking on my results.   Monday test was done at 8:00. I checked and let her know her results were not in yet and that it can take up to 72 hrs before her results are ready.   She is on Mychart so she is checking there too.

## 2018-07-14 ENCOUNTER — Telehealth: Payer: Self-pay

## 2018-07-14 ENCOUNTER — Ambulatory Visit: Payer: Medicare Other | Admitting: Psychiatry

## 2018-07-14 ENCOUNTER — Other Ambulatory Visit: Payer: Self-pay

## 2018-07-14 ENCOUNTER — Ambulatory Visit (INDEPENDENT_AMBULATORY_CARE_PROVIDER_SITE_OTHER): Payer: Medicare Other | Admitting: Psychiatry

## 2018-07-14 DIAGNOSIS — F3160 Bipolar disorder, current episode mixed, unspecified: Secondary | ICD-10-CM | POA: Diagnosis not present

## 2018-07-14 NOTE — Progress Notes (Signed)
[facility] Counselor/Therapist Progress Note  Patient ID: Jennifer [name], MRN: [id number],    Date: 07/14/2018  Time Spent: 60 minutes    11:00am to 12:00noon  Treatment Type: Individual Therapy   Reported Symptoms:  Depression and Anxiety, some physical concerns (gastro)   Mental Status Exam:  Appearance:    n/a     telehealth  Behavior:  Sharing and Rigid  Motor:  n/a    telehealth  Speech/Language:   talking almost non-stop but conversational tone, not manic  Affect:   n/a    telehealth  Mood:  anxious  Thought process:  circumstantial  Thought content:    Some obsessive thoughts and ruuminating  Sensory/Perceptual disturbances:    WNL  Orientation:  oriented to person, place, time/date, situation, day of week, month of year and year  Attention:  Good  Concentration:  Good  Memory:  Immediate;   Fair Recent;   Fair "fair" per patient  Fund of knowledge:   Fair  Insight:    Fair  Judgment:   Fair  Impulse Control:  Fair   Risk Assessment: Danger to Self:  No Self-injurious Behavior: No Danger to Others: No   Duty to Warn:no Physical Aggression / Violence:No  Access to Firearms a concern: No  Gang Involvement:No    Any changes in medications since last therapy appointment: Patient reports 1 medication added since last therapy appt with prior therapist and that Dicyclomine HCL 20mg  (1 tab prn for stomach spasms up to 3 per day) per hospital ER. Was tested for COVID-19 earlier this week and hopes to get results soon. No symptoms except some coughing "but I started back smoking due to anxiety".  Any new health concerns since last therapy appointment: Patient reports no additional health concerns except info shared above since last therapy appt. "Except some gastrointestinal issues and I'm in contact with me Dr for that. "  Subjective:    Patient anxious today from the start, mostly due to gastro problems and an upcoming colonoscopy July 27th. Also has pending  COVID-19 results to come in sometime this week.  States she really hasn't had any symptoms except coughing "but I also started back smoking and I think the coughing is a result of my smoking."  Some obsessiveness in her thoughts.  Talking a lot as is the case when her anxiety is elevated some. Frustrated that she had not completed her homework, and we focused on the part she had done, encouraging her to not be so punitive about it not being "totally complete." States shes's having trouble sticking to her strategies she has listed.  Suggested she use a schedule instead of a list, and that the schedule not be rigid.   Really needed the time today to process her anxieties and to feel heard.      (Review from prior session, per pt.) : Repeated that she wants her coping list to include reading her Bible some every day, walk at least 20 minutes daily, sew some each day as she enjoys being creative. Discusses how these 3 activities help her.  "I sometimes see myself as a perfectionist, OCD, type person.  Strong tendency to dwell on past or perceived future events which all tend to be negative.  Is very open in talking about this and responds well to redirection towards being proactive versus always feeling she has to be reactive and doesn't have power to make good things happen.  Worked also on staying more in  the Present versus past to too far into future imagining bad things will happen.     Talked about goals.  Has been using CBT workbook and we agreed for her to complete prior assignment and also to follow through with the schedule she is going to make to help her stay focused on strategies and tasks.  *(Reviewing these each session for now.) Patient goals include: (in addition to Goals on Flowsheet added and discussed 06/15/2018: I want to be more positive. I want to pray more often for myself. Get back into fitness, do more walking.  Start doing Yoga. Want to have less anger towards people who are not  nice to me. Want to attend church more regularly because that can help me. To get along better with my husband "most of the time"   Interventions: Cognitive Behavioral Therapy, Solution-Oriented/Positive Psychology and Ego-Supportive  Diagnosis:   ICD-10-CM   1. Mixed bipolar I disorder ([place])  F31.60     Plan:   Work to stay in the Present rather than past or future. Patient to continue/and increase strategies she was already practicing some to help her manage her emotions in productive ways.  Goal review with patient. To move forward with her incomplete assignment with CBT book including notetaking for further discussion next session, especially how this assignment intersects with her goals under Flowsheet section of her chart here.   Shanon Ace, LCSW

## 2018-07-14 NOTE — Telephone Encounter (Signed)
Checking on COVID 19 results - not available yet.

## 2018-07-14 NOTE — Telephone Encounter (Signed)
Pt. Checking on COVID 19 results - not available yet.

## 2018-07-15 ENCOUNTER — Telehealth: Payer: Self-pay | Admitting: *Deleted

## 2018-07-15 LAB — NOVEL CORONAVIRUS, NAA: SARS-CoV-2, NAA: NOT DETECTED

## 2018-07-15 NOTE — Telephone Encounter (Signed)
Pt called to get results of COVID test obtained 07/11/2018; results not available; explained to pt that results take 72 hours or 3 business days; this can be delayed based on the number of tests that must be completed; pt encouraged to check my chart for results; she verbalized understanding.

## 2018-07-21 ENCOUNTER — Ambulatory Visit: Payer: Medicare Other | Admitting: Psychiatry

## 2018-07-27 ENCOUNTER — Other Ambulatory Visit: Payer: Self-pay | Admitting: Family Medicine

## 2018-07-27 DIAGNOSIS — M25562 Pain in left knee: Secondary | ICD-10-CM

## 2018-07-28 ENCOUNTER — Ambulatory Visit
Admission: RE | Admit: 2018-07-28 | Discharge: 2018-07-28 | Disposition: A | Discharge status: Home / Self Care | Payer: Medicare Other | Source: Ambulatory Visit | Attending: Family Medicine | Admitting: Family Medicine

## 2018-07-28 ENCOUNTER — Other Ambulatory Visit: Payer: Self-pay

## 2018-07-28 ENCOUNTER — Ambulatory Visit: Payer: Medicare Other | Admitting: Psychiatry

## 2018-07-28 ENCOUNTER — Ambulatory Visit (INDEPENDENT_AMBULATORY_CARE_PROVIDER_SITE_OTHER): Payer: Medicare Other | Admitting: Psychiatry

## 2018-07-28 DIAGNOSIS — F3160 Bipolar disorder, current episode mixed, unspecified: Secondary | ICD-10-CM

## 2018-07-28 DIAGNOSIS — M25562 Pain in left knee: Secondary | ICD-10-CM

## 2018-07-28 NOTE — Progress Notes (Signed)
[facility] Counselor/Therapist Progress Note  Patient ID: Jennifer [name], MRN: [id number],    Date: 07/28/2018  Time Spent: 60 minutes   11:00am to 12:00noon  Treatment Type: Individual Therapy   Virtual Visit Note Connected with patient by a video enabled telemedicine/telehealth application or telephone, with their informed consent, and verified patient privacy and that I am speaking with the correct person using two identifiers. I discussed the limitations, risks, security and privacy concerns of performing psychotherapy and management service by telephone and the availability of in person appointments. I also discussed with the patient that there may be a patient responsible charge related to this service. The patient expressed understanding and agreed to proceed. I discussed the treatment planning with the patient. The patient was provided an opportunity to ask questions and all were answered. The patient agreed with the plan and demonstrated an understanding of the instructions. The patient was advised to call  our office if  symptoms worsen or feel they are in a crisis state and need immediate contact.   Therapist Location: [name] Patient Location: home    Reported Symptoms:  Depression and Anxiety, some physical concerns (knee- Baker's cyst?)   Mental Status Exam:  Appearance:    n/a     telehealth  Behavior:  Sharing  Motor:  n/a    telehealth  Speech/Language:   Talking clearly and is coherent  Affect:   n/a    telehealth  Mood:  anxious  Thought process:  Some circumstantiality;   Thought content:    Some obsessive thoughts mostly about physical health issues  Sensory/Perceptual disturbances:    WNL  Orientation:  oriented to person, place, time/date, situation, day of week, month of year and year  Attention:  Good  Concentration:  Good  Memory:  Immediate;   Fair Recent;   Fair "fair" per patient  Fund of knowledge:   Fair  Insight:    Fair   Judgment:   Fair  Impulse Control:  Fair   Risk Assessment: Danger to Self:  No Self-injurious Behavior: No Danger to Others: No   Duty to Warn:no Physical Aggression / Violence:No  Access to Firearms a concern: No  Gang Involvement:No    Any new med changes or health concerns since last therapy appointment: Patient reports no changes in meds.  She did have an appt earlier today about her knee, "may be a Baker's cyst, does have follow up appt with doctor  Subjective:   Patient anxious today from the start, mostly due to knee problems.  Did have appt today and thinks it may be a Baker's cyst.  Talking a lot as is the case when her anxiety is elevated some.  Processed homework from prior session, focusing on self-talk, encouraging her to not be so punitive about it not being "totally complete."  Having difficulty in follow through on her strategies so we discussed how she can remind herself especially re: self talk and communication strategies. States using a schedule as mentioned last session, has helped, and yet be flexible with it rather than punitive towards herself as that tend to recycle her negative self-talk.  (Review from prior session, per pt.) : Repeated that she wants her coping list to include reading her Bible some every day, walk at least 20 minutes daily, sew some each day as she enjoys being creative. Discusses how these 3 activities help her.  "I sometimes see myself as a perfectionist, OCD, type person.  Strong tendency  to dwell on past or perceived future events which all tend to be negative.  Is very open in talking about this and responds well to redirection towards being proactive versus always feeling she has to be reactive and doesn't have power to make good things happen.  Worked also on staying more in the Present versus past to too far into future imagining bad things will happen.     Review of goals (with some progress noted) including list below. Continues using  CBT workbook and we agreed for her to complete prior assignment and also to follow through with the schedule she is going to make to help her stay focused on strategies and tasks.  *(Reviewing these each session for now.) Patient goals include: (in addition to Goals on Flowsheet added and discussed 06/15/2018: I want to be more positive. I want to pray more often for myself. Get back into fitness, do more walking.  Start doing Yoga. Want to have less anger towards people who are not nice to me. Want to attend church more regularly because that can help me. To get along better with my husband "most of the time"   Interventions: Cognitive Behavioral Therapy, Solution-Oriented/Positive Psychology and Ego-Supportive  Diagnosis:   ICD-10-CM   1. Mixed bipolar I disorder ([place])  F31.60     Plan:   Work to stay in the Present rather than past or future. Patient to continue/and increase strategies she was already practicing some to help her manage her emotions in productive ways.  Goal review with patient. To move forward with her incomplete assignment with CBT book including notetaking for further discussion next session, especially how this assignment intersects with her goals under Flowsheet section of her chart here.   Shanon Ace, LCSW

## 2018-08-04 ENCOUNTER — Other Ambulatory Visit: Payer: Self-pay

## 2018-08-04 ENCOUNTER — Ambulatory Visit: Payer: Medicare Other | Admitting: Psychiatry

## 2018-08-04 DIAGNOSIS — F3160 Bipolar disorder, current episode mixed, unspecified: Secondary | ICD-10-CM

## 2018-08-04 NOTE — Progress Notes (Signed)
[facility] Counselor/Therapist Progress Note  Patient ID: Jennifer [name], MRN: [id number],    Date: 08/04/2018  Time Spent: 60 minutes   11:00am to 12:00noon  Treatment Type: Individual Therapy     Reported Symptoms:  Depression and Anxiety, some physical concerns (knee- Baker's cyst?)   Mental Status Exam:  Appearance:   casual  Behavior:  Sharing, pleasant although anxious  Motor:  normal  Speech/Language:   Talking clearly and is coherent  Affect:  anxious  Mood:  anxious  Thought process:  Some circumstantiality but less than last visit  Thought content:    Some obsessive thoughts mostly about physical health issues  Sensory/Perceptual disturbances:    WNL  Orientation:  oriented to person, place, time/date, situation, day of week, month of year and year  Attention:  Good  Concentration:  Good  Memory:  Immediate;   Fair Recent;   Fair "fair" per patient  Fund of knowledge:   Fair  Insight:    Fair  Judgment:   Fair  Impulse Control:  Fair   Risk Assessment: Danger to Self:  No Self-injurious Behavior: No Danger to Others: No   Duty to Warn:no Physical Aggression / Violence:No  Access to Firearms a concern: No  Gang Involvement:No    Any new med changes or health concerns since last therapy appointment: Patient reports no changes in meds.  She did have an appt earlier today about her knee, "may be a Baker's cyst, does have follow up appt with doctor.   **UPDATE-patient says doctor ruled out Baker's cyst on her knee and is referring her to an orthopedic doctor.  Subjective:   As noted above Patient has been referred to orthopedic doctor to follow up more on knee issue that she was anxious about last session. Talking a lot as is the case when her anxiety is elevated some, but is staying on track a little more today  in conversation rather than jumping around quite as much from subject to subject, although expressed tearfulness as she spoke about lack of  personal contacts. Did review homework from prior session, focusing on self-talk, encouraging her to not be so punitive about it not being "totally complete." Also processed some homework TD:[id number] and her wanting to have some control when she feels diminished control in situations.  Discussed this some more today. Having difficulty in follow through on her strategies so we discussed how she can remind herself especially re: self talk and communication strategies. Also focused on how her faith can be of more support to her.  I "get upset and go into the past a lot."  Looked at how going to the past tends to undermine her attempts and desire to stay more in the present and moving forward. States using a schedule as mentioned last session, has helped, and yet be flexible with it rather than punitive towards herself as that tend to recycle her negative self-talk. Spoke about this punitive tendency that happens often in her conversation with patient, and how to decrease it.   (Review again from earlier session, per pt as "I need to remind myself of this often.) : Repeated that she wants her coping list to include reading her Bible some every day, walk at least 20 minutes daily, sew some each day as she enjoys being creative. Discusses how these 3 activities help her.  "I sometimes see myself as a perfectionist, OCD, type person.  Strong tendency to dwell on past or perceived future  events which all tend to be negative.  Is very open in talking about this and responds well to redirection towards being proactive versus always feeling she has to be reactive and doesn't have power to make good things happen.  Worked also on staying more in the Present versus past to too far into future imagining bad things will happen.     Review of goals (with some progress noted) including list below. Continues using CBT workbook and we agreed for her to complete prior assignment and also to follow through with the schedule she  is going to make to help her stay focused on strategies and tasks.  *(Reviewing these each session for now as patient finds it helpful.) Patient goals include: (in addition to Goals on Flowsheet added and discussed 06/15/2018: I want to be more positive. I want to pray more often for myself. Get back into fitness, do more walking.  Start doing Yoga. Want to have less anger towards people who are not nice to me. Want to attend church more regularly because that can help me. To get along better with my husband "most of the time"   Interventions: Cognitive Behavioral Therapy, Solution-Oriented/Positive Psychology and Ego-Supportive  Diagnosis:   ICD-10-CM   1. Mixed bipolar I disorder ([place])  F31.60     Plan:   Work to stay in the Present rather than past or future. Patient to continue/and increase strategies she was already practicing some to help her manage her emotions in productive ways.  Goal review with patient. To move forward with her with CBT book including notetaking for further discussion how it relates to her goals next session, especially how this assignment intersects with her goals under Flowsheets. (especially the calming skills).   Shanon Ace, LCSW

## 2018-08-08 ENCOUNTER — Ambulatory Visit: Payer: Medicare Other | Admitting: Physician Assistant

## 2018-08-11 ENCOUNTER — Other Ambulatory Visit: Payer: Self-pay

## 2018-08-11 ENCOUNTER — Encounter: Payer: Self-pay | Admitting: Orthopaedic Surgery

## 2018-08-11 ENCOUNTER — Ambulatory Visit (INDEPENDENT_AMBULATORY_CARE_PROVIDER_SITE_OTHER): Payer: Medicare Other | Admitting: Psychiatry

## 2018-08-11 ENCOUNTER — Ambulatory Visit (INDEPENDENT_AMBULATORY_CARE_PROVIDER_SITE_OTHER): Payer: Medicare Other | Admitting: Orthopaedic Surgery

## 2018-08-11 ENCOUNTER — Ambulatory Visit: Payer: Medicare Other | Admitting: Psychiatry

## 2018-08-11 VITALS — Ht 64.0 in | Wt 177.0 lb

## 2018-08-11 DIAGNOSIS — F3160 Bipolar disorder, current episode mixed, unspecified: Secondary | ICD-10-CM

## 2018-08-11 DIAGNOSIS — G8929 Other chronic pain: Secondary | ICD-10-CM | POA: Diagnosis not present

## 2018-08-11 DIAGNOSIS — M25562 Pain in left knee: Secondary | ICD-10-CM | POA: Diagnosis not present

## 2018-08-11 NOTE — Progress Notes (Signed)
Subjective:    Patient ID: Jennifer [name], female    DOB: 1965/12/19, 53 y.o.   MRN: [id number]  HPI She has had increasing pain of the left knee over the last six to eight weeks. She has swelling, popping and giving way. She was seen at the Urgent Care on 07-08-2018.  X-rays were negative then. She continues to have pain and giving way.  She is concerned the knee is not getting better. Nothing seems to help it.     Review of Systems  Constitutional: Positive for activity change.  Musculoskeletal: Positive for arthralgias, gait problem and joint swelling.  All other systems reviewed and are negative.  For Review of Systems, all other systems reviewed and are negative.  The following is a summary of the past history medically, past history surgically, known current medicines, social history and family history.  This information is gathered electronically by the computer from prior information and documentation.  I review this each visit and have found including this information at this point in the chart is beneficial and informative.   Past Medical History:  Diagnosis Date  . Binge-eating and purging type anorexia nervosa   . Bipolar disorder ([place])   . Cystitis, interstitial   . OCD (obsessive compulsive disorder)     Past Surgical History:  Procedure Laterality Date  . BLADDER SURGERY    . CHOLECYSTECTOMY    . VOCAL CORD LATERALIZATION, ENDOSCOPIC APPROACH W/ MLB      Current Outpatient Medications on File Prior to Visit  Medication Sig Dispense Refill  . cycloSPORINE (RESTASIS) 0.05 % ophthalmic emulsion 1 drop 2 (two) times daily.    Marland Kitchen olopatadine (PATANOL) 0.1 % ophthalmic solution 1 drop 2 (two) times daily.    . clonazePAM (KLONOPIN) 0.5 MG tablet Take 1 tablet (0.5 mg total) by mouth 2 (two) times daily as needed for anxiety. 20 tablet 1  . dicyclomine (BENTYL) 20 MG tablet Take 1 tablet (20 mg total) by mouth 3 (three) times daily as needed for spasms (abdominal  cramping). 20 tablet 0  . fluticasone (FLONASE) 50 MCG/ACT nasal spray Place 1 spray into both nostrils daily.    . fluvoxaMINE (LUVOX) 100 MG tablet Take 3 po qd 270 tablet 1  . gabapentin (NEURONTIN) 600 MG tablet Take 1 tablet (600 mg total) by mouth 4 (four) times daily. 120 tablet 5  . hydrOXYzine (ATARAX/VISTARIL) 50 MG tablet 1-2 po q6hr prn (Patient not taking: Reported on 08/11/2018) 90 tablet 5  . lurasidone (LATUDA) 40 MG TABS tablet Take 1 tablet (40 mg total) by mouth daily with breakfast. Must take with food. 30 tablet 5  . omeprazole (PRILOSEC) 40 MG capsule 40 mg 2 (two) times a day.     . rizatriptan (MAXALT) 10 MG tablet Take 10 mg by mouth as needed for migraine. May repeat in 2 hours if needed    . sucralfate (CARAFATE) 1 g tablet Take 1 tablet by mouth 2 (two) times a day.    . traZODone (DESYREL) 100 MG tablet Take 1-2 tablets (100-200 mg total) by mouth at bedtime as needed for sleep. 60 tablet 5  . valACYclovir (VALTREX) 500 MG tablet 500 mg daily.      No current facility-administered medications on file prior to visit.     Social History   Socioeconomic History  . Marital status: Married    Spouse name: Not on file  . Number of children: Not on file  . Years of education: Not  on file  . Highest education level: Not on file  Occupational History  . Not on file  Social Needs  . Financial resource strain: Not on file  . Food insecurity    Worry: Not on file    Inability: Not on file  . Transportation needs    Medical: Not on file    Non-medical: Not on file  Tobacco Use  . Smoking status: Current Every Day Smoker    Packs/day: 0.50    Types: Cigarettes    Start date: 01/20/1988    Last attempt to quit: 05/15/2016    Years since quitting: 2.2  . Smokeless tobacco: Never Used  Substance and Sexual Activity  . Alcohol use: No    Alcohol/week: 0.0 standard drinks  . Drug use: No  . Sexual activity: Yes    Birth control/protection: None  Lifestyle  .  Physical activity    Days per week: Not on file    Minutes per session: Not on file  . Stress: Not on file  Relationships  . Social Herbalist on phone: Not on file    Gets together: Not on file    Attends religious service: Not on file    Active member of club or organization: Not on file    Attends meetings of clubs or organizations: Not on file    Relationship status: Not on file  . Intimate partner violence    Fear of current or ex partner: Not on file    Emotionally abused: Not on file    Physically abused: Not on file    Forced sexual activity: Not on file  Other Topics Concern  . Not on file  Social History Narrative   Still having marital discord.  Last night she had a conversation with her husband and told him she was planning to leave him.  He got upset and said he would do anything to with him.  He has called their pastor and set up a counseling session for Sunday.  She states "I do not know if I will even go.  I feel like I have been trying for a long time and he has not been trying at all"    No family history on file.  Ht 5\' 4"  (1.626 m)   Wt 177 lb (80.3 kg)   LMP 10/07/2011 Comment: neg preg  BMI 30.38 kg/m   Body mass index is 30.38 kg/m.      Objective:   Physical Exam Vitals signs reviewed.  Constitutional:      Appearance: She is well-developed.  HENT:     Head: Normocephalic and atraumatic.  Eyes:     Conjunctiva/sclera: Conjunctivae normal.     Pupils: Pupils are equal, round, and reactive to light.  Neck:     Musculoskeletal: Normal range of motion and neck supple.  Cardiovascular:     Rate and Rhythm: Normal rate and regular rhythm.  Pulmonary:     Effort: Pulmonary effort is normal.  Abdominal:     Palpations: Abdomen is soft.  Musculoskeletal:     Left knee: She exhibits swelling. Tenderness found. Medial joint line tenderness noted.       Legs:  Skin:    General: Skin is warm and dry.  Neurological:     Mental Status:  She is alert and oriented to person, place, and time.     Cranial Nerves: No cranial nerve deficit.     Motor: No abnormal muscle tone.  Coordination: Coordination normal.     Deep Tendon Reflexes: Reflexes are normal and symmetric. Reflexes normal.  Psychiatric:        Behavior: Behavior normal.        Thought Content: Thought content normal.        Judgment: Judgment normal.      I have reviewed the records of 07-08-2018 and the x-rays and report.     Assessment & Plan:   Encounter Diagnosis  Name Primary?  . Chronic pain of left knee Yes   I will get MRI of the left knee.  I am concerned about meniscus tear of the left knee.  Return after MRI.  Call if any problem.  Precautions discussed.   Electronically Signed Sanjuana Kava, MD 7/23/20208:46 AM

## 2018-08-11 NOTE — Progress Notes (Signed)
[facility] Counselor/Therapist Progress Note  Patient ID: Jennifer [name], MRN: [id number],    Date: 08/11/2018  Time Spent: 58 minutes   11:04am to 12:02pm  Treatment Type: Individual Therapy    Reported Symptoms:  Depression (not as bad today) and Anxiety, some physical concerns (to have MRI re: knee per orthopedic doctor   Mental Status Exam:  Appearance:   casual  Behavior:  Sharing, pleasant although anxious  Motor:  normal  Speech/Language:   Talking clearly and is coherent  Affect:  Anxious, tearful intermittently  Mood:  anxious, irritable  Thought process:  Some circumstantiality but less than last visit  Thought content:    Some obsessive thoughts mostly about physical health issues  Sensory/Perceptual disturbances:    WNL  Orientation:  oriented to person, place, time/date, situation, day of week, month of year and year  Attention:  Good  Concentration:  Good  Memory:  Immediate;   Fair Recent;   Fair "fair" per patient  Fund of knowledge:   Fair  Insight:    Fair  Judgment:   Fair  Impulse Control:  Fair   Risk Assessment: Danger to Self:  No Self-injurious Behavior: No Danger to Others: No  Duty to Warn:no Physical Aggression / Violence:No  Access to Firearms a concern: No  Gang Involvement:No    Any new med changes or health concerns since last therapy appointment: To be scheduled soon for MRI on her knee per orthopedic doctor   Subjective:    Talking a lot today as is the case when her anxiety is elevated some, sharing how she feels her husband talks down to her and is disrespectful at times.  She explains that there's nothing she can do, but after discussing this, she realized she does have options to keep herself exposed to him when he is being disrespectful, and can calmly walk away rather that "feel like a victim" and letting herself remain exposed to anyone being disrespectful to her.  Worked this session on her self-talk, self-limiting, and  letting go of "being a victim" when people do not treat her with respect (often times people within her family).  Used role play with patient to clearly demonstrate more assertive and self-caring ways of managing the inappropriate and disrespectful behavior of others. Patient very engaged in this and seemed to gain some ideas and skills that can benefit her.    Reviewed prior homework, focusing on self-talk, encouraging her to have more self confidence in working on these issues.  Also focused on how her faith can be of more support to her.  I "get upset and go into the past a lot."  Looked at how going to the past tends to undermine her attempts and desire to stay more in the present and moving forward with new skills to deal with stressors in the present.  States using a schedule as mentioned in prior session, has helped, and yet be flexible with it rather than punitive towards herself as that tends to recycle her negative self-talk. Spoke about this punitive tendency that happens often in her conversation with patient, and how to decrease it.   (Review again from earlier session, per pt as "I need to remind myself of this often.) : Repeated that she wants her coping list to include reading her Bible some every day, walk at least 20 minutes daily, sew some each day as she enjoys being creative. Discusses how these 3 activities help her.  "I sometimes  see myself as a perfectionist, OCD, type person.  Strong tendency to dwell on past or perceived future events which all tend to be negative.  Is very open in talking about this and responds well to redirection towards being proactive versus always feeling she has to be reactive and doesn't have power to make good things happen.  Worked also on staying more in the Present versus past to too far into future imagining bad things will happen.     Review of goals (with some progress noted) including list below. Continues using CBT workbook and we agreed for her to  complete prior assignment and also to follow through with the schedule she is going to make to help her stay focused on strategies and tasks.  *(Reviewing these each session for now as patient finds it helpful.) Patient goals include: (in addition to Goals on Flowsheet added and discussed 06/15/2018: I want to be more positive. I want to pray more often for myself. Get back into fitness, do more walking.  Start doing Yoga. Want to have less anger towards people who are not nice to me. Want to attend church more regularly because that can help me. To get along better with my husband "most of the time"   Interventions: Cognitive Behavioral Therapy, Solution-Oriented/Positive Psychology and Ego-Supportive  Diagnosis:   ICD-10-CM   1. Mixed bipolar I disorder ([place])  F31.60     Plan:   Work to stay in the present rather than past or future. Patient to continue/and increase strategies she was already practicing some to help her manage her emotions in productive ways, and also the role plays we did today.  Goal review with patient. To move forward with her with CBT book including notetaking for further discussion how it relates to her goals next session, especially how this assignment intersects with her goals under Flowsheets. (especially the calming skills).   Shanon Ace, LCSW

## 2018-08-16 ENCOUNTER — Telehealth: Payer: Self-pay | Admitting: Radiology

## 2018-08-16 NOTE — Telephone Encounter (Signed)
Patient has [facility] Medicare, no auth required for MRI scan, I have called to schedule Jennifer [name] on 08/22/2018 2 pm patient should arrive at 1:30  And wear a mask.   If she develops any cough or fever, she should RS  She will also need a follow up visit with Dr Luna Glasgow.  She was driving, and states she will call back in a bit to get the information.

## 2018-08-16 NOTE — Telephone Encounter (Signed)
Jennifer [name] spoke to her, and advised.

## 2018-08-17 ENCOUNTER — Telehealth: Payer: Self-pay

## 2018-08-17 NOTE — Telephone Encounter (Signed)
Patient called stating her left knee is hurting much worse. Stated she had used the Aspercreme with Lidocaine and it helps for a little while. I suggested her to try the Voltaren Gel and see if that would help. She asked if you could prescribe something for the pain.  PATIENT USES [facility]

## 2018-08-17 NOTE — Telephone Encounter (Signed)
Patient called back, said that she is in severe pain and cannot wait until tomorrow for Dr Luna Glasgow to advise on this.  I advised if she is hurting that badly to go to the ED.  She understands.  She also voiced concern about taking/using the voltaren at all because of her history of liver damage already and GI ulcers.  She will let us know what the ED advises.

## 2018-08-18 ENCOUNTER — Other Ambulatory Visit: Payer: Self-pay

## 2018-08-18 ENCOUNTER — Telehealth: Payer: Self-pay

## 2018-08-18 ENCOUNTER — Ambulatory Visit (INDEPENDENT_AMBULATORY_CARE_PROVIDER_SITE_OTHER): Payer: Medicare Other | Admitting: Psychiatry

## 2018-08-18 DIAGNOSIS — F3160 Bipolar disorder, current episode mixed, unspecified: Secondary | ICD-10-CM | POA: Diagnosis not present

## 2018-08-18 MED ORDER — TRAMADOL HCL 50 MG PO TABS: 50.0000 mg | ORAL_TABLET | Freq: Four times a day (QID) | ORAL | 0 refills | Status: AC | PRN

## 2018-08-18 NOTE — Progress Notes (Signed)
[facility] Counselor/Therapist Progress Note  Patient ID: Jennifer [name], MRN: [id number],    Date: 08/18/2018  Time Spent: 60 minutes   11:00am to 12:00pm  Treatment Type: Individual Therapy   Virtual Visit Note Connected with patient by a video enabled telemedicine/telehealth application or telephone, with their informed consent, and verified patient privacy and that I am speaking with the correct person using two identifiers. I discussed the limitations, risks, security and privacy concerns of performing psychotherapy and management service by telephone and the availability of in person appointments. I also discussed with the patient that there may be a patient responsible charge related to this service. The patient expressed understanding and agreed to proceed. I discussed the treatment planning with the patient. The patient was provided an opportunity to ask questions and all were answered. The patient agreed with the plan and demonstrated an understanding of the instructions. The patient was advised to call  our office if  symptoms worsen or feel they are in a crisis state and need immediate contact.   Therapist Location: [name] Patient Location: home     Reported Symptoms:  Depression some worse due to physical pain with her knee and to have MRI re: per orthopedic doctor), some increased arguments with husband, anxiety , second guessing herself, self-negating talk   Mental Status Exam:  Appearance:    n/a   teletherapy  Behavior:  Sharing  Motor:  normal  Speech/Language:   Talking clearly and is coherent  Affect:   n/a   teletherapy  Mood:  anxious, irritable  Thought process:  Some circumstantiality but less than last visit  Thought content:    Some obsessive thoughts mostly about physical health issues  Sensory/Perceptual disturbances:    WNL  Orientation:  oriented to person, place, time/date, situation, day of week, month of year and year   Attention:  Good  Concentration:  Good  Memory:  Immediate;   Fair Recent;   Fair "fair" per patient  Fund of knowledge:   Fair  Insight:    Fair  Judgment:   Fair  Impulse Control:  Fair   Risk Assessment: Danger to Self:  No Self-injurious Behavior: No Danger to Others: No  Duty to Warn:no Physical Aggression / Violence:No  Access to Firearms a concern: No  Gang Involvement:No    Any new med changes or health concerns since last therapy appointment: To be scheduled soon for MRI on her knee per orthopedic doctor   Subjective:   Patient reports more depression and part of that she thinks is because of her knee pain, while waiting for MRI to be scheduled.  More arguments with husband recently over money and afterwards she reports "I binged and purged, I do that sometimes under stress."  Discussed this with patient and she explained that she "only does it under extreme stress."  Looked closer at this with patient and talked through it more, including her explaining how she has been able to decrease the behavior.  Commits to working harder, moving forward, to decrease this behavior more. Tearful off and on today feeling alone and frequently using self-negating talk.  Looked at and reminded her of what she knows she can do that will help her feel less alone and experience less emotional hurt.  Emphasis on her resources through some of her prior homework, the CBT workbook that we use some, stepping outside for a while, and also changing up her self talk to be more positive versus  negative.    Reviewed homework, focusing on self-talk, encouraging her to have more self confidence in working on these issues.  Also focused on how her faith can be of more support to her.  I "get upset and go into the past a lot."  Wants to be able to stay in present more as she states she has spent a lot of therapy time previously talking through her past, and it's the present that "bothers me more now." Looked at  how going to the past tends to undermine her attempts and desire to stay more in the present and moving forward with new skills to deal with stressors in the present.  States using a schedule as mentioned in prior session, has helped, and yet be flexible with it rather than rigid as that tends to recycle her negative self-talk. Spoke about this punitive tendency that happens often in her conversation with patient, and how to decrease it.   (Review again from earlier session, per pt as "I need to remind myself of this often.) : Repeated that she wants her coping list to include reading her Bible some every day, walk at least 20 minutes daily, sew some each day as she enjoys being creative. Discusses how these 3 activities help her.  "I sometimes see myself as a perfectionist, OCD, type person.  Strong tendency to dwell on past or perceived future events which all tend to be negative.  Is very open in talking about this and responds well to redirection towards being proactive versus always feeling she has to be reactive and doesn't have power to make good things happen.  Worked also on staying more in the Present versus past to too far into future imagining bad things will happen.     Review of goals (with some progress noted) including list below. Continues using CBT workbook and we agreed for her to complete prior assignment and also to follow through with the schedule she is going to make to help her stay focused on strategies and tasks.  *(Reviewing these each session for now as patient finds it helpful.) Patient goals include: (in addition to Goals on Flowsheet added and discussed 06/15/2018: I want to be more positive. I want to pray more often for myself. Get back into fitness, do more walking.  Start doing Yoga. Want to have less anger towards people who are not nice to me. Want to attend church more regularly because that can help me. To get along better with my husband "most of the  time"   Interventions: Cognitive Behavioral Therapy, Solution-Oriented/Positive Psychology and Ego-Supportive  Diagnosis:   ICD-10-CM   1. Mixed bipolar I disorder ([place])  F31.60     Plan:   Work to stay in the present rather than past or future. Patient to continue/and increase strategies she was already practicing some to help her manage her emotions in productive ways, and stay in the present.  Goal review with patient. To move forward with her with CBT book including notetaking for further discussion how it relates to her goals next session, especially how this assignment intersects with her goals under Flowsheets. (especially the calming skills).   Shanon Ace, LCSW

## 2018-08-18 NOTE — Telephone Encounter (Signed)
Patient called this morning to check if a prescription for something for pain had been done. I didn't see   anything, please advise.  PATIENT USES [facility]

## 2018-08-18 NOTE — Telephone Encounter (Signed)
Spoke with patient and relayed that Dr. Luna Glasgow had sent in prescription for Toradol to [facility].

## 2018-08-22 ENCOUNTER — Other Ambulatory Visit: Payer: Self-pay

## 2018-08-22 ENCOUNTER — Ambulatory Visit (HOSPITAL_COMMUNITY)
Admission: RE | Admit: 2018-08-22 | Discharge: 2018-08-22 | Disposition: A | Discharge status: Home / Self Care | Payer: Medicare Other | Source: Ambulatory Visit | Attending: Orthopaedic Surgery | Admitting: Orthopaedic Surgery

## 2018-08-22 DIAGNOSIS — G8929 Other chronic pain: Secondary | ICD-10-CM

## 2018-08-22 DIAGNOSIS — M25562 Pain in left knee: Secondary | ICD-10-CM | POA: Insufficient documentation

## 2018-08-23 ENCOUNTER — Ambulatory Visit (INDEPENDENT_AMBULATORY_CARE_PROVIDER_SITE_OTHER): Payer: Medicare Other | Admitting: Orthopaedic Surgery

## 2018-08-23 ENCOUNTER — Encounter: Payer: Self-pay | Admitting: Orthopaedic Surgery

## 2018-08-23 ENCOUNTER — Telehealth: Payer: Self-pay | Admitting: Orthopaedic Surgery

## 2018-08-23 VITALS — BP 119/76 | HR 72 | Ht 64.0 in | Wt 173.0 lb

## 2018-08-23 DIAGNOSIS — M25562 Pain in left knee: Secondary | ICD-10-CM

## 2018-08-23 DIAGNOSIS — G8929 Other chronic pain: Secondary | ICD-10-CM

## 2018-08-23 NOTE — Progress Notes (Signed)
Patient Jennifer [name], female DOB:Jun 17, 1965, 53 y.o. QAS:[id number]  No chief complaint on file.   HPI  Jennifer [name] is a 53 y.o. female who has left knee pain. She had MRI which showed: IMPRESSION: 1. Nondisplaced tear of the posterior medial meniscus. 2. Intact cruciate ligaments. 3. 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  I have explained the findings to her.  I have recommended arthroscopy of the knee. She wants to wait a while.  I told her to call the office when she decides she would like the procedure and have Dr. Aline Brochure see her.  She agrees.    Body mass index is 29.7 kg/m.  ROS  Review of Systems  Constitutional: Positive for activity change.  Musculoskeletal: Positive for arthralgias, gait problem and joint swelling.  All other systems reviewed and are negative.   All other systems reviewed and are negative.  The following is a summary of the past history medically, past history surgically, known current medicines, social history and family history.  This information is gathered electronically by the computer from prior information and documentation.  I review this each visit and have found including this information at this point in the chart is beneficial and informative.    Past Medical History:  Diagnosis Date  . Binge-eating and purging type anorexia nervosa   . Bipolar disorder ([place])   . Cystitis, interstitial   . OCD (obsessive compulsive disorder)     Past Surgical History:  Procedure Laterality Date  . BLADDER SURGERY    . CHOLECYSTECTOMY    . VOCAL CORD LATERALIZATION, ENDOSCOPIC APPROACH W/ MLB      History reviewed. No pertinent family history.  Social History Social History   Tobacco Use  . Smoking status: Current Every Day Smoker    Packs/day: 0.50    Types: Cigarettes    Start date: 01/20/1988    Last attempt to quit: 05/15/2016     Years since quitting: 2.2  . Smokeless tobacco: Never Used  Substance Use Topics  . Alcohol use: No    Alcohol/week: 0.0 standard drinks  . Drug use: No    Allergies  Allergen Reactions  . Codeine Hives and Shortness Of Breath  . Sonata [[place]] Other (See Comments)    Hallucinations  . Meloxicam Other (See Comments)    Possible chest tightness - instructed by MD not to take  . Other Nausea Only    CODIENE-unable to enter under "Agent" for some reason  . Penicillins Hives, Swelling and Rash    Has patient had a PCN reaction causing immediate rash, facial/tongue/throat swelling, SOB or lightheadedness with hypotension: yes Has patient had a PCN reaction causing severe rash involving mucus membranes or skin necrosis: no Has patient had a PCN reaction that required hospitalization: no Has patient had a PCN reaction occurring within the last 10 years: no If all of the above answers are "NO", then may proceed with Cephalosporin use.;     Current Outpatient Medications  Medication Sig Dispense Refill  . clonazePAM (KLONOPIN) 0.5 MG tablet Take 1 tablet (0.5 mg total) by mouth 2 (two) times daily as needed for anxiety. 20 tablet 1  . cycloSPORINE (RESTASIS) 0.05 % ophthalmic emulsion 1 drop 2 (two) times daily.    Marland Kitchen dicyclomine (BENTYL) 20 MG tablet Take 1 tablet (20 mg total) by mouth 3 (three) times daily as needed for spasms (abdominal cramping). 20 tablet 0  .  fluticasone (FLONASE) 50 MCG/ACT nasal spray Place 1 spray into both nostrils daily.    . fluvoxaMINE (LUVOX) 100 MG tablet Take 3 po qd 270 tablet 1  . gabapentin (NEURONTIN) 600 MG tablet Take 1 tablet (600 mg total) by mouth 4 (four) times daily. 120 tablet 5  . Galcanezumab-gnlm (EMGALITY [place]) Inject into the skin.    . hydrOXYzine (ATARAX/VISTARIL) 50 MG tablet 1-2 po q6hr prn 90 tablet 5  . lurasidone (LATUDA) 40 MG TABS tablet Take 1 tablet (40 mg total) by mouth daily with breakfast. Must take with food. 30 tablet 5   . olopatadine (PATANOL) 0.1 % ophthalmic solution 1 drop 2 (two) times daily.    Marland Kitchen omeprazole (PRILOSEC) 40 MG capsule 40 mg 2 (two) times a day.     . rizatriptan (MAXALT) 10 MG tablet Take 10 mg by mouth as needed for migraine. May repeat in 2 hours if needed    . sucralfate (CARAFATE) 1 g tablet Take 1 tablet by mouth 2 (two) times a day.    . traMADol (ULTRAM) 50 MG tablet Take 1 tablet (50 mg total) by mouth every 6 (six) hours as needed for up to 5 days. One tablet every six hours as needed for pain. 20 tablet 0  . traZODone (DESYREL) 100 MG tablet Take 1-2 tablets (100-200 mg total) by mouth at bedtime as needed for sleep. 60 tablet 5  . valACYclovir (VALTREX) 500 MG tablet 500 mg daily.      No current facility-administered medications for this visit.      Physical Exam  Blood pressure 119/76, pulse 72, height 5\' 4"  (1.626 m), weight 173 lb (78.5 kg), last menstrual period 10/07/2011.  Constitutional: overall normal hygiene, normal nutrition, well developed, normal grooming, normal body habitus. Assistive device:none  Musculoskeletal: gait and station Limp left, muscle tone and strength are normal, no tremors or atrophy is present.  .  Neurological: coordination overall normal.  Deep tendon reflex/nerve stretch intact.  Sensation normal.  Cranial nerves II-XII intact.   Skin:   Normal overall no scars, lesions, ulcers or rashes. No psoriasis.  Psychiatric: Alert and oriented x 3.  Recent memory intact, remote memory unclear.  Normal mood and affect. Well groomed.  Good eye contact.  Cardiovascular: overall no swelling, no varicosities, no edema bilaterally, normal temperatures of the legs and arms, no clubbing, cyanosis and good capillary refill.  Lymphatic: palpation is normal.  Left knee has pain, effusion. ROM 0 to 110, positive medial [place], limp left, NV intact.  All other systems reviewed and are negative   The patient has been educated about the nature of the  problem(s) and counseled on treatment options.  The patient appeared to understand what I have discussed and is in agreement with it.  Encounter Diagnosis  Name Primary?  . Chronic pain of left knee Yes    PLAN Call if any problems.  Precautions discussed.  Continue current medications.   Return to clinic To call Dr. Aline Brochure when she wants to have arthroscopy of the left knee.   Electronically Signed Sanjuana Kava, MD 8/4/202010:42 AM

## 2018-08-23 NOTE — Telephone Encounter (Signed)
Patient has called to ask if knee injections would help temporarily, as she is not quite ready to have the surgical evaluation yet. Please advise.

## 2018-08-24 NOTE — Telephone Encounter (Signed)
Yes, it may help but ultimately she will need surgery.  You can schedule to be seen if she wants.

## 2018-08-25 ENCOUNTER — Ambulatory Visit: Payer: Medicare Other | Admitting: Psychiatry

## 2018-08-25 ENCOUNTER — Ambulatory Visit (INDEPENDENT_AMBULATORY_CARE_PROVIDER_SITE_OTHER): Payer: Medicare Other | Admitting: Psychiatry

## 2018-08-25 ENCOUNTER — Other Ambulatory Visit: Payer: Self-pay

## 2018-08-25 DIAGNOSIS — F3160 Bipolar disorder, current episode mixed, unspecified: Secondary | ICD-10-CM

## 2018-08-25 NOTE — Progress Notes (Signed)
[facility] Counselor/Therapist Progress Note  Patient ID: Jennifer [name], MRN: [id number],    Date: 08/25/2018  Time Spent: 60 minutes   11:00am to 12:00pm  Treatment Type: Individual Therapy   Virtual Visit Note Connected with patient by a video enabled telemedicine/telehealth application or telephone, with their informed consent, and verified patient privacy and that I am speaking with the correct person using two identifiers. I discussed the limitations, risks, security and privacy concerns of performing psychotherapy and management service by telephone and the availability of in person appointments. I also discussed with the patient that there may be a patient responsible charge related to this service. The patient expressed understanding and agreed to proceed. I discussed the treatment planning with the patient. The patient was provided an opportunity to ask questions and all were answered. The patient agreed with the plan and demonstrated an understanding of the instructions. The patient was advised to call  our office if  symptoms worsen or feel they are in a crisis state and need immediate contact.   Therapist Location: [name] Patient Location: home   Reported Symptoms:  Depression some worse due to physical pain with her knee and to have MRI re: per orthopedic doctor), some increased arguments with husband, anxiety , second guessing herself, self-negating talk, looking at the "negatives"   Mental Status Exam:  Appearance:    n/a   teletherapy  Behavior:  Sharing  Motor:  normal  Speech/Language:   Talking clearly and is coherent  Affect:   n/a   teletherapy  Mood:  anxious, irritable  Thought process:  Some circumstantiality but less than last visit  Thought content:    Some obsessive thoughts mostly about physical health issues  Sensory/Perceptual disturbances:    WNL  Orientation:  oriented to person, place, time/date, situation, day of week, month of  year and year  Attention:  Good  Concentration:  Good  Memory:  Immediate;   Fair Recent;   Fair "fair" per patient  Fund of knowledge:   Fair  Insight:    Fair  Judgment:   Fair  Impulse Control:  Fair   Risk Assessment: Danger to Self:  No Self-injurious Behavior: No Danger to Others: No  Duty to Warn:no Physical Aggression / Violence:No  Access to Firearms a concern: No  Gang Involvement:No    Any new med changes or health concerns since last therapy appointment: None reported  Subjective:   Patient reports more depression and part of that she thinks is because of her knee pain and med creates some nausea.  Hadn't slept as well past couple nights. Some recent arguments with husband but not as bad as previous week.   Tearful off and on today feeling helpless and frequently using self-negating talk, focusing on negatives.  Looked at and reminded her of what she knows she can do that will help her feel less alone and experience less emotional pain.  Emphasis on her resources through some of her prior homework, the CBT workbook that we use some, stepping outside for a while, and also changing up her self talk to be more positive versus negative. Encouraged less assuming in so many situations as this tends to always go in a negative direction for her.  Reviewed most recent homework, focusing again on self-talk, encouraging her to have more self confidence in working on these issues, leaving off the "I can't".   Also reminded and  focused on how her faith can be of  more support to her.  I "get upset and go into the past a lot."  Wants to be able to stay in present more as she states she has spent a lot of therapy time previously talking through her past, and it's the present that "bothers me more now." Looked at how going to the past tends to undermine her attempts and desire to stay more in the present and moving forward with new skills to deal with stressors in the present.    (Review  again from earlier session, per pt as "I need to remind myself of this often.) : Repeated that she wants her coping list to include reading her Bible some every day, sew some each day as she enjoys being creative. Discusses how these activities help her.  "I sometimes see myself as a perfectionist, OCD, type person.  Strong tendency to dwell on past or perceived future events which all tend to be negative.  Is very open in talking about this and responds well to redirection towards being proactive versus always feeling she has to be reactive and doesn't have power to make good things happen.  Worked also on staying more in the Present versus past to too far into future imagining bad things will happen.  (Unable to do the usual recommended walking, due to her current knee issues."   Review of goals (with some progress noted) including list below. Continues using CBT workbook and we agreed for her to complete prior assignment and also to follow through with the schedule she is going to make to help her stay focused on strategies and tasks.  *(Reviewing these each session for now as patient finds it helpful.) Patient goals include: (in addition to Goals on Flowsheet added and discussed 06/15/2018: I want to be more positive. I want to pray more often for myself.     Get back into fitness, do more walking.  Start doing Yoga. (hold off til knee pain is gone 08/25/18) Want to have less anger towards people who are not nice to me. Want to attend church more regularly because that can help me. (when church is open) To get along better with my husband "most of the time"   Interventions: Cognitive Behavioral Therapy, Solution-Oriented/Positive Psychology and Ego-Supportive  Diagnosis:   ICD-10-CM   1. Mixed bipolar I disorder ([place])  F31.60     Plan:   Work to stay in the present rather than past or future. Patient to continue/and increase strategies she was already practicing some to help her manage her  emotions in productive ways, and stay in the present.  Goal review with patient. To move forward with her with CBT book including notetaking for further discussion how it relates to her goals next session, especially how this assignment intersects with her goals under Flowsheets. (especially the calming skills).   Shanon Ace, LCSW

## 2018-08-25 NOTE — Telephone Encounter (Signed)
Called back to patient to offer; patient will schedule.

## 2018-09-01 ENCOUNTER — Other Ambulatory Visit: Payer: Self-pay

## 2018-09-01 ENCOUNTER — Ambulatory Visit: Payer: Medicare Other | Admitting: Psychiatry

## 2018-09-01 DIAGNOSIS — F3160 Bipolar disorder, current episode mixed, unspecified: Secondary | ICD-10-CM

## 2018-09-01 NOTE — Progress Notes (Signed)
[facility] Counselor/Therapist Progress Note  Patient ID: Jennifer [name], MRN: [id number],    Date: 09/01/2018  Time Spent: 60 minutes     11:00am to 12:00noon  Treatment Type: Individual Therapy   Virtual Visit Note Connected with patient by a video enabled telemedicine/telehealth application or telephone, with their informed consent, and verified patient privacy and that I am speaking with the correct person using two identifiers. I discussed the limitations, risks, security and privacy concerns of performing psychotherapy and management service by telephone and the availability of in person appointments. I also discussed with the patient that there may be a patient responsible charge related to this service. The patient expressed understanding and agreed to proceed. I discussed the treatment planning with the patient. The patient was provided an opportunity to ask questions and all were answered. The patient agreed with the plan and demonstrated an understanding of the instructions. The patient was advised to call  our office if  symptoms worsen or feel they are in a crisis state and need immediate contact.   Therapist Location: [name] Patient Location: home   Reported Symptoms:  Depression, anxiety , second guessing herself, self-negating talk, looking at the "negatives", cognitive distortions   Mental Status Exam:  Appearance:    n/a   teletherapy  Behavior:  Sharing  Motor:  normal  Speech/Language:   Talking clearly and is coherent  Affect:   n/a   teletherapy  Mood:  anxious, irritable  Thought process:  Some circumstantiality  Thought content:    rumination  Sensory/Perceptual disturbances:    WNL  Orientation:  oriented to person, place, time/date, situation, day of week, month of year and year  Attention:  Good  Concentration:  Good  Memory:  Immediate;   Fair Recent;   Fair "fair" per patient  Fund of knowledge:   Fair  Insight:    Fair   Judgment:   Fair  Impulse Control:  Fair   Risk Assessment: Danger to Self:  No Self-injurious Behavior: No Danger to Others: No  Duty to Warn:no Physical Aggression / Violence:No  Access to Firearms a concern: No  Gang Involvement:No    Any new med changes or health concerns since last therapy appointment: None reported  Subjective:   Patient reports symptoms as noted above.  Depression has been about the same but "my anxiety has been worse."  I can't stop thinking about all the people in my  family dying and I'll be all alone."  "I've been worried about this ever since my stepfather died in 02-21-2018.   Some recent arguments with husband but not as bad as previous week. Tearful today not feeling very connected to family.  Feels she gets neglected a lot and "put down" a lot.  Often over-personalizes, overthinks, and end up feeling hurt or minimized.  Looked at this more intentionally with patient and cited examples, with her citing some as well.  Practiced how to interrupt the overthinking and over-personalizing and always assuming the worst. New homework assigned re: Identifying Negative and Self-Sabotaging Thought Patterns.   Reminded her of what she knows she can do that will help her feel less alone and experience less emotional pain.  Emphasis on her resources through some of her prior homework, the CBT workbook that we use some, stepping outside for a while, and also changing up her self talk to be more positive versus negative. Encouraged less assuming in so many situations as this tends to  always go in a negative direction for her.  Had patient focus more on how her faith can be of more support to her.  I "get upset and go into the past a lot."  Wants to be able to stay in present more as she states she has spent a lot of therapy time previously talking through her past, and it's the present that "bothers me more now."  (Review again from earlier session, per pt as "I need to  remind myself of this often.) : Repeated that she wants her coping list to include reading her Bible some every day, sew some each day as she enjoys being creative. Discusses how these activities help her.  "I sometimes see myself as a perfectionist, OCD, type person.  Strong tendency to dwell on past or perceived future events which all tend to be negative.  Is very open in talking about this and responds well to redirection towards being proactive versus always feeling she has to be reactive and doesn't have power to make good things happen.  Worked also on staying more in the Present versus past to too far into future imagining bad things will happen.  (Unable to do the usual recommended walking, due to her current knee issues."   Review of goals (with some progress noted) including list below. Continues using CBT workbook  with the schedule she is going to make to help her stay focused on strategies and tasks.  *(Reviewing these each session for now as patient finds it helpful.) Patient goals include: (in addition to Goals on Flowsheet added and discussed 06/15/2018: I want to be more positive. I want to pray more often for myself.     Get back into fitness, do more walking.  Start doing Yoga. (hold off til knee pain is gone 08/25/18) Want to have less anger towards people who are not nice to me. Want to attend church more regularly because that can help me. (when church is open) To get along better with my husband "most of the time"   Interventions: Cognitive Behavioral Therapy, Solution-Oriented/Positive Psychology and Ego-Supportive  Diagnosis:   ICD-10-CM   1. Mixed bipolar I disorder ([place])  F31.60     Plan:   Continue work to stay in the present rather than past or future. Patient to continue/and increase strategies she was already practicing some to help her manage her emotions in productive ways, and stay in the present.  Goal review with patient. To move forward with her with CBT  book including notetaking for further discussion how it relates to her goals next session, especially how this assignment intersects with her goals under Flowsheets. (especially the calming skills). Next session in 1-2 wks.   Shanon Ace, LCSW

## 2018-09-02 ENCOUNTER — Telehealth: Payer: Self-pay | Admitting: Physician Assistant

## 2018-09-02 NOTE — Telephone Encounter (Signed)
I spoke w/ pt.  She's done some sort of Detox that was 'created by a real doctor with all natural ingredients. Then today I had a melt-down. My stomach was upset and I had to take a Bentyl and I just don't understand why I can't use something all natural and it mess me up.'  Got really anxious worried about what was happening in her body.  She took a trazodone to calm down and it has helped.  Advised her to stop taking that, it could be interacting with her prescription medications.  Encouraged her not to get upset when she has irritable bowel syndrome type symptoms.  Told her that the symptoms can come and go for no rhyme or reason.  She should contact her PCP or GI if the GI symptoms do not improve.

## 2018-09-02 NOTE — Telephone Encounter (Signed)
Pt called. Having some sore of reaction to meds. Itching really bad. Had to end up taking a Trazadone to calm down. Have questions about meds. Please return call today

## 2018-09-08 ENCOUNTER — Ambulatory Visit: Payer: Medicare Other | Admitting: Psychiatry

## 2018-09-09 ENCOUNTER — Ambulatory Visit (INDEPENDENT_AMBULATORY_CARE_PROVIDER_SITE_OTHER): Payer: Medicare Other | Admitting: Psychiatry

## 2018-09-09 ENCOUNTER — Other Ambulatory Visit: Payer: Self-pay

## 2018-09-09 DIAGNOSIS — F3160 Bipolar disorder, current episode mixed, unspecified: Secondary | ICD-10-CM

## 2018-09-09 NOTE — Progress Notes (Signed)
[facility] Counselor/Therapist Progress Note  Patient ID: Jennifer [name], MRN: [id number],    Date: 09/09/2018  Time Spent: 60 minutes     11:00am to 12:00noon  Treatment Type: Individual Therapy    Reported Symptoms:  Depression, anxiety , second guessing herself, self-negating talk, looking at the "negatives", cognitive distortions   Mental Status Exam:  Appearance:    n/a   teletherapy  Behavior:  Sharing  Motor:  normal  Speech/Language:   Talking clearly and is coherent  Affect:   n/a   teletherapy  Mood:  anxious, irritable  Thought process:  Some circumstantiality  Thought content:    rumination  Sensory/Perceptual disturbances:    WNL  Orientation:  oriented to person, place, time/date, situation, day of week, month of year and year  Attention:  Good  Concentration:  Good  Memory:  Immediate;   Fair Recent;   Fair "fair" per patient  Fund of knowledge:   Fair  Insight:    Fair  Judgment:   Fair  Impulse Control:  Fair   Risk Assessment: Danger to Self:  No Self-injurious Behavior: No Danger to Others: No  Duty to Warn:no Physical Aggression / Violence:No  Access to Firearms a concern: No  Gang Involvement:No    Any new med changes or health concerns since last therapy appointment: None reported  Subjective:   Patient reports symptoms as noted above. On edge with some tearfulness today and states she is more anxious, but denies that anything has happened to cause this.  Some racing thoughts today and able to slow down (some) during session especially when encouraged to do so. She seems to not focus intently on any strategy to help her manage symptoms better and as a result overloads herself with things she thinks may help and then feels overwhelmed so does nothing different.  Looked at this tendency with patient and talked through a better approach today.  Supported her wanting to get better and use helpful resources, but her focus needs to be more refined  rather than trying to fix everything at once with an overabundance of strategies/ideas that might help.   Reviewed some CBT and picked from several worksheets she already had and selected which we would actually use today.  Patient picked 2--1 related to cognitive distortions and the other one related more to self-concept and how she treats herself. Worked on both of these and she is to keep her focus mostly on these for the upcoming week and not other resources.  This should allow for more concentrated efforts on goals.  Still over-personalizes, overthinks, and end up feeling hurt or minimized. Practiced how to interrupt the overthinking and over-personalizing and always assuming the worst. New homework assigned re: Identifying Negative and Self-Sabotaging Thought Patterns/Distortions and and an Updated Self Care Plan to include more positive self-talk.   Wants to be able to stay in present more as she states she has spent a lot of therapy time previously talking through her past, and it's the present that "bothers me more now."  (Review again from earlier session, per pt as "I need to remind myself of this often.) : Repeated that she wants her coping list to include reading her Bible some every day, sew some each day as she enjoys being creative. Discusses how these activities help her.  "I sometimes see myself as a perfectionist, OCD, type person.  Strong tendency to dwell on past or perceived future events which all tend to be negative.  Is very open in talking about this and responds well to redirection towards being proactive versus always feeling she has to be reactive and doesn't have power to make good things happen.  Worked also on staying more in the Present versus past to too far into future imagining bad things will happen.   Review of goals (with some progress noted) including list below.  *(Reviewing these each session for now as patient finds it helpful.) Patient goals include: (in addition  to Goals on Flowsheet added and discussed 06/15/2018: I want to be more positive. I want to pray more often for myself.     Get back into fitness, do more walking.  Start doing Yoga. (hold off til knee pain is gone 08/25/18) Want to have less anger towards people who are not nice to me. Want to attend church more regularly because that can help me. (when church is open) To get along better with my husband "most of the time"   Interventions: Cognitive Behavioral, Solution-Focused  Diagnosis:   ICD-10-CM   1. Mixed bipolar I disorder ([place])  F31.60     Plan:   Continue work to stay in the present rather than past or future. Patient to continue/and increase strategies she was already practicing some to help her manage her emotions in productive ways, and stay in the present.  Goal review with patient and progress noted. Next session in 1-2 wks.   Shanon Ace, LCSW

## 2018-09-12 ENCOUNTER — Other Ambulatory Visit: Payer: Self-pay

## 2018-09-12 ENCOUNTER — Ambulatory Visit (INDEPENDENT_AMBULATORY_CARE_PROVIDER_SITE_OTHER): Payer: Medicare Other | Admitting: Family Medicine

## 2018-09-12 VITALS — BP 125/84 | HR 77 | Temp 98.3°F | Ht 64.0 in | Wt 176.6 lb

## 2018-09-12 DIAGNOSIS — F411 Generalized anxiety disorder: Secondary | ICD-10-CM | POA: Diagnosis not present

## 2018-09-12 DIAGNOSIS — F32A Depression, unspecified: Secondary | ICD-10-CM

## 2018-09-12 DIAGNOSIS — F502 Bulimia nervosa: Secondary | ICD-10-CM | POA: Diagnosis not present

## 2018-09-12 DIAGNOSIS — J029 Acute pharyngitis, unspecified: Secondary | ICD-10-CM

## 2018-09-12 DIAGNOSIS — R7309 Other abnormal glucose: Secondary | ICD-10-CM

## 2018-09-12 DIAGNOSIS — G43909 Migraine, unspecified, not intractable, without status migrainosus: Secondary | ICD-10-CM | POA: Insufficient documentation

## 2018-09-12 DIAGNOSIS — G43C0 Periodic headache syndromes in child or adult, not intractable: Secondary | ICD-10-CM

## 2018-09-12 DIAGNOSIS — F329 Major depressive disorder, single episode, unspecified: Secondary | ICD-10-CM | POA: Diagnosis not present

## 2018-09-12 DIAGNOSIS — K219 Gastro-esophageal reflux disease without esophagitis: Secondary | ICD-10-CM | POA: Insufficient documentation

## 2018-09-12 DIAGNOSIS — H9201 Otalgia, right ear: Secondary | ICD-10-CM

## 2018-09-12 MED ORDER — FAMOTIDINE 20 MG PO TABS: 20.0000 mg | ORAL_TABLET | Freq: Two times a day (BID) | ORAL | 5 refills | Status: DC

## 2018-09-12 MED ORDER — OMEPRAZOLE 40 MG PO CPDR: 40.0000 mg | DELAYED_RELEASE_CAPSULE | Freq: Two times a day (BID) | ORAL | 5 refills | Status: DC

## 2018-09-12 MED ORDER — NYSTATIN 100000 UNIT/ML MT SUSP: 5.0000 mL | Freq: Four times a day (QID) | OROMUCOSAL | 0 refills | Status: DC

## 2018-09-12 NOTE — Patient Instructions (Addendum)
ENT-referral Restart prilosec(omprazole) and pepcid Neuro referral Nystatin for thrush  5.6% A1c(diabetic test) PRE DIABETES 5.8-6.4%, DIABETES 6.5% and higher Watch carbohydrates Preventing Type 2 Diabetes Mellitus Type 2 diabetes (type 2 diabetes mellitus) is a long-term (chronic) disease that affects blood sugar (glucose) levels. Normally, a hormone called insulin allows glucose to enter cells in the body. The cells use glucose for energy. In type 2 diabetes, one or both of these problems may be present:  The body does not make enough insulin.  The body does not respond properly to insulin that it makes (insulin resistance). Insulin resistance or lack of insulin causes excess glucose to build up in the blood instead of going into cells. As a result, high blood glucose (hyperglycemia) develops, which can cause many complications. Being overweight or obese and having an inactive (sedentary) lifestyle can increase your risk for diabetes. Type 2 diabetes can be delayed or prevented by making certain nutrition and lifestyle changes. What nutrition changes can be made?   Eat healthy meals and snacks regularly. Keep a healthy snack with you for when you get hungry between meals, such as fruit or a handful of nuts.  Eat lean meats and proteins that are low in saturated fats, such as chicken, fish, egg whites, and beans. Avoid processed meats.  Eat plenty of fruits and vegetables and plenty of grains that have not been processed (whole grains). It is recommended that you eat: ? 1?2 cups of fruit every day. ? 2?3 cups of vegetables every day. ? 6?8 oz of whole grains every day, such as oats, whole wheat, bulgur, brown rice, quinoa, and millet.  Eat low-fat dairy products, such as milk, yogurt, and cheese.  Eat foods that contain healthy fats, such as nuts, avocado, olive oil, and canola oil.  Drink water throughout the day. Avoid drinks that contain added sugar, such as soda or sweet  tea.  Follow instructions from your health care provider about specific eating or drinking restrictions.  Control how much food you eat at a time (portion size). ? Check food labels to find out the serving sizes of foods. ? Use a kitchen scale to weigh amounts of foods.  Saute or steam food instead of frying it. Cook with water or broth instead of oils or butter.  Limit your intake of: ? Salt (sodium). Have no more than 1 tsp (2,400 mg) of sodium a day. If you have heart disease or high blood pressure, have less than ? tsp (1,500 mg) of sodium a day. ? Saturated fat. This is fat that is solid at room temperature, such as butter or fat on meat. What lifestyle changes can be made? Activity   Do moderate-intensity physical activity for at least 30 minutes on at least 5 days of the week, or as much as told by your health care provider.  Ask your health care provider what activities are safe for you. A mix of physical activities may be best, such as walking, swimming, cycling, and strength training.  Try to add physical activity into your day. For example: ? Park in spots that are farther away than usual, so that you walk more. For example, park in a far corner of the parking lot when you go to the office or the grocery store. ? Take a walk during your lunch break. ? Use stairs instead of elevators or escalators. Weight Loss  Lose weight as directed. Your health care provider can determine how much weight loss is best for you and  can help you lose weight safely.  If you are overweight or obese, you may be instructed to lose at least 5?7 % of your body weight. Alcohol and Tobacco   Limit alcohol intake to no more than 1 drink a day for nonpregnant women and 2 drinks a day for men. One drink equals 12 oz of beer, 5 oz of wine, or 1 oz of hard liquor.  Do not use any tobacco products, such as cigarettes, chewing tobacco, and e-cigarettes. If you need help quitting, ask your health care  provider. Work With [facility] Provider  Have your blood glucose tested regularly, as told by your health care provider.  Discuss your risk factors and how you can reduce your risk for diabetes.  Get screening tests as told by your health care provider. You may have screening tests regularly, especially if you have certain risk factors for type 2 diabetes.  Make an appointment with a diet and nutrition specialist (registered dietitian). A registered dietitian can help you make a healthy eating plan and can help you understand portion sizes and food labels. Why are these changes important?  It is possible to prevent or delay type 2 diabetes and related health problems by making lifestyle and nutrition changes.  It can be difficult to recognize signs of type 2 diabetes. The best way to avoid possible damage to your body is to take actions to prevent the disease before you develop symptoms. What can happen if changes are not made?  Your blood glucose levels may keep increasing. Having high blood glucose for a long time is dangerous. Too much glucose in your blood can damage your blood vessels, heart, kidneys, nerves, and eyes.  You may develop prediabetes or type 2 diabetes. Type 2 diabetes can lead to many chronic health problems and complications, such as: ? Heart disease. ? Stroke. ? Blindness. ? Kidney disease. ? Depression. ? Poor circulation in the feet and legs, which could lead to surgical removal (amputation) in severe cases. Where to find support  Ask your health care provider to recommend a registered dietitian, diabetes educator, or weight loss program.  Look for local or online weight loss groups.  Join a gym, fitness club, or outdoor activity group, such as a walking club. Where to find more information To learn more about diabetes and diabetes prevention, visit:  American Diabetes Association (ADA): www.diabetes.[employer] of Diabetes and Digestive  and Kidney Diseases: FindSpin.nl To learn more about healthy eating, visit:  The U.S. Department of Agriculture Scientist, research (physical sciences)), Choose My Plate: http://wiley-williams.com/  Office of Disease Prevention and Health Promotion (ODPHP), Dietary Guidelines: SurferLive.at Summary  You can reduce your risk for type 2 diabetes by increasing your physical activity, eating healthy foods, and losing weight as directed.  Talk with your health care provider about your risk for type 2 diabetes. Ask about any blood tests or screening tests that you need to have. This information is not intended to replace advice given to you by your health care provider. Make sure you discuss any questions you have with your health care provider. Document Released: 04/29/2015 Document Revised: 04/29/2018 Document Reviewed: 02/26/2015 Elsevier Patient Education  2020 [employer].
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
## 2018-09-13 ENCOUNTER — Ambulatory Visit: Payer: Medicare Other | Admitting: Physician Assistant

## 2018-09-14 ENCOUNTER — Telehealth: Payer: Self-pay | Admitting: Physician Assistant

## 2018-09-14 ENCOUNTER — Telehealth: Payer: Self-pay | Admitting: Family Medicine

## 2018-09-14 NOTE — Telephone Encounter (Signed)
C/o abd pain, has seen new Dr. In Linna Hoff about the stomach issues.  Had a 'breakdown' today b/c worried about her A1C that was 5.6 recently last week.  "Do you think the Anette Guarneri is what is causing it?  Should I go off of it?  I just do not know what to do."  Patient sounds very upset but then calms down and even said that she needs to keep taking the Latuda at least for a month to see how it works.  And I agree with that plan.  Her A1c at 5.6 is not worrisome.  I tried to reassure her of that.  She has not binged and purged except once in this past week which is good for her.  She is getting help from Rinaldo Cloud, LCSW concerning this problem and finding counseling very beneficial.  No changes made at this time.

## 2018-09-14 NOTE — Telephone Encounter (Signed)
Use prilosec and carafate as instructed by gastro. Call gastro for appt to discuss further treatment options

## 2018-09-14 NOTE — Telephone Encounter (Signed)
Patient is calling and states yesterday after she ate breakfast her stomach started hurting bad. She states the ER gave her Dicycloverine 20mg  for stomach cramps and she took that yesterday. She states today it has done the same thing. She states she is doing everything she is supposed to do for stomach ulcers but she is unsure what she needs to do as Dicycloverine is not helping. She would like advice.

## 2018-09-14 NOTE — Telephone Encounter (Signed)
Routing to Dr. Corum for Advice? 

## 2018-09-14 NOTE — Telephone Encounter (Signed)
Patient stated she had A1-C tested it was 5.6 and she has some concerns bc she takes [place], wants to speak with Helene Kelp regarding this

## 2018-09-14 NOTE — Telephone Encounter (Signed)
Not sure she can stay on Latuda.  Not eating well either.

## 2018-09-15 ENCOUNTER — Other Ambulatory Visit: Payer: Self-pay

## 2018-09-15 ENCOUNTER — Other Ambulatory Visit: Payer: Self-pay | Admitting: Family Medicine

## 2018-09-15 ENCOUNTER — Ambulatory Visit (INDEPENDENT_AMBULATORY_CARE_PROVIDER_SITE_OTHER): Payer: Medicare Other | Admitting: Psychiatry

## 2018-09-15 DIAGNOSIS — F3160 Bipolar disorder, current episode mixed, unspecified: Secondary | ICD-10-CM | POA: Diagnosis not present

## 2018-09-15 DIAGNOSIS — K219 Gastro-esophageal reflux disease without esophagitis: Secondary | ICD-10-CM

## 2018-09-15 NOTE — Progress Notes (Signed)
[facility] Counselor/Therapist Progress Note  Patient ID: GENESEE AFFELDT, MRN: [id number],    Date: 09/15/2018  Time Spent: 60 minutes     11:00am to 12:00noon  Treatment Type: Individual Therapy    Reported Symptoms:  Depression, anxiety , second guessing herself, self-negating talk, looking at the "negatives", cognitive distortions   Mental Status Exam:  Appearance:    neat  Behavior:  Sharing  Motor:  normal  Speech/Language:   Talking clearly and is coherent  Affect:   anxious, depressed, tearful  Mood:  anxious, irritable  Thought process:  Some circumstantiality  Thought content:    rumination  Sensory/Perceptual disturbances:    WNL  Orientation:  oriented to person, place, time/date, situation, day of week, month of year and year  Attention:  Good  Concentration:  Good  Memory:  Immediate;   Fair Recent;   Fair "fair" per patient  Fund of knowledge:   Fair  Insight:    Fair  Judgment:   Fair  Impulse Control:  Fair   Risk Assessment: Danger to Self:  No Self-injurious Behavior: No Danger to Others: No  Duty to Warn:no Physical Aggression / Violence:No  Access to Firearms a concern: No  Gang Involvement:No    Any new med changes or health concerns since last therapy appointment: None reported  Subjective:   Patient's symptoms listed above including anxiety, depression, negative self-talk, occasional binging and purging, looking mostly at "the negatives", and cognitive distortions. Tearful frequently today, mostly about her problems with husband that she experiences as being over-controlling, self-negating, assuming the worst, and saying she just can't change.  I agreed with her that she can't change him, but can work on changing things within herself. Denies any SI.  When tearfulness ended, we talked more about what she can change and not change withiin herself.  Patient responded to this and worked the rest of session on coming up with strategies that  she has used before with at least short term benefit.  Explored these with patient since they have proven to be helpful on prior occasions.   Today is mostly upset over relationship with husband and how she feels he limits her.  She reviews again how she feels he talks down to me, he throws things in the past back up in my face, hollers at me sometimes, he micro-manages things, however she also reports some positives about husband and some of the things he does for her.  After discussing these again and we worked to  help patient connect with the things she can control and what she can't control in all of what she described above.  She agrees to work harder to use strategies to help her manage her anxious and depressed mood, and to try to persevere and not give up on a strategy after only a short-term effort with it. To continue trying to stop the negative self-talk, and want to continue journal using the CBT charts that we've used in sessions before with benefit.  Encouraged her to continue work of not over-loading herself to where she feels overwhelmed, even with the use of strategies. Also agrees to strategies we specifically discussed in regards to binging and purging and to make that a priority. Relaxation exercises also reviewed with patient as these have been helpful to her previously.   (Continue) New homework assigned re: Identifying Negative and Self-Sabotaging Thought Patterns/Distortions and and an Updated Self Care Plan to include more positive self-talk.  (Review again from earlier session,  per pt as "I need to remind myself of this often.) : Repeated that she wants her coping list to include reading her Bible some every day, sew some each day as she enjoys being creative. Discusses how these activities help her.  "I sometimes see myself as a perfectionist, OCD, type person.  Strong tendency to dwell on past or perceived future events which all tend to be negative.  Is very open in talking about  this and responds well to redirection towards being proactive versus always feeling she has to be reactive and doesn't have power to make good things happen.  Worked also on staying more in the Present versus past to too far into future imagining bad things will happen.   Review of goals (with some progress noted) including list below.  *(Reviewing these each session for now as patient finds it helpful.) Patient goals include: (in addition to Goals on Flowsheet added and discussed 06/15/2018: I want to be more positive. I want to pray more often for myself.     Get back into fitness, do more walking.  Start doing Yoga. (hold off til knee pain is gone 08/25/18) Want to have less anger towards people who are not nice to me. Want to attend church more regularly because that can help me. (when church is open) To get along better with my husband "most of the time"   Interventions: Cognitive Behavioral, Solution-Focused  Diagnosis:   ICD-10-CM   1. 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 Patient to continue/and increase strategies she was already practicing some to help her manage her emotions in productive ways, and stay in the present.  Goal review per Flowsheets with patient, and progress noted. Next session in 1-2 wks.   Shanon Ace, LCSW

## 2018-09-15 NOTE — Telephone Encounter (Signed)
Referral has been sent to [facility]  

## 2018-09-15 NOTE — Telephone Encounter (Signed)
She is asking to be referred to a new Gastro she is not pleased with current Dr. Please advise?

## 2018-09-15 NOTE — Telephone Encounter (Signed)
Referral made for second opinion

## 2018-09-19 ENCOUNTER — Encounter: Payer: Self-pay | Admitting: Internal Medicine

## 2018-09-19 ENCOUNTER — Telehealth (HOSPITAL_COMMUNITY): Payer: Self-pay | Admitting: Psychiatry

## 2018-09-19 ENCOUNTER — Ambulatory Visit: Payer: Medicare Other | Admitting: Orthopedic Surgery

## 2018-09-19 ENCOUNTER — Telehealth (HOSPITAL_COMMUNITY): Payer: Self-pay | Admitting: Professional

## 2018-09-20 ENCOUNTER — Telehealth (HOSPITAL_COMMUNITY): Payer: Self-pay | Admitting: Professional

## 2018-09-20 ENCOUNTER — Ambulatory Visit: Payer: Medicare Other | Admitting: Orthopaedic Surgery

## 2018-09-22 ENCOUNTER — Telehealth (HOSPITAL_COMMUNITY): Payer: Self-pay | Admitting: Professional

## 2018-09-22 ENCOUNTER — Ambulatory Visit: Payer: Medicare Other | Admitting: Psychiatry

## 2018-09-22 ENCOUNTER — Other Ambulatory Visit (HOSPITAL_COMMUNITY): Payer: Self-pay | Attending: Psychiatry | Admitting: Professional

## 2018-09-22 ENCOUNTER — Other Ambulatory Visit: Payer: Self-pay

## 2018-09-22 DIAGNOSIS — F502 Bulimia nervosa: Secondary | ICD-10-CM

## 2018-09-29 ENCOUNTER — Ambulatory Visit (INDEPENDENT_AMBULATORY_CARE_PROVIDER_SITE_OTHER): Payer: Medicare Other | Admitting: Psychiatry

## 2018-09-29 ENCOUNTER — Other Ambulatory Visit: Payer: Self-pay

## 2018-09-29 DIAGNOSIS — F3160 Bipolar disorder, current episode mixed, unspecified: Secondary | ICD-10-CM | POA: Diagnosis not present

## 2018-09-29 NOTE — Progress Notes (Signed)
[facility] Counselor/Therapist Progress Note   Patient ID: AARVI STABLES, MRN: [id number],    Date: 09/29/2018  Time Spent: 60 minutes    11:00am to 12:00pm  Treatment Type: Individual Therapy  Reported Symptoms: anxiety, depression, reports binging and purging 3 times in past week  Mental Status Exam:  Appearance:   Casual     Behavior:  Appropriate and Sharing  Motor:  Normal  Speech/Language:   speech is some faster, but not manic at this time; did say she was taking meds.  Affect:  anxious  Mood:  anxious and some depression  Thought process:  some circumstantiality  Thought content:    WNL  Sensory/Perceptual disturbances:    WNL  Orientation:  oriented to person, place, time/date, situation, day of week, month of year and year  Attention:  Fair  Concentration:  Fair  Memory:  patiet reports recent memory problems  Fund of knowledge:   Good  Insight:    Fluctuates between Fair and Poor  Judgment:   Fluctuates between Fair and Poor  Impulse Control:  Fair to Poor   Risk Assessment: Danger to Self:  Denies any SI Self-injurious Behavior: No Danger to Others: No Duty to Warn:no Physical Aggression / Violence:No  Access to Firearms a concern: No  Gang Involvement:No   Subjective: Patient in today reporting depression, anxiety, and that she had recently had some re-occurrences of eating disordered behavior on 3 occasions. Stated that she used to have them more "a while back" and " I did it this past week because I have been stressed."  Talked about stress with husband, mother, and that she thinks her stress led to her binge and purge. Talked with her more about this as well as healthierways to manage her stress.  Patient states that she can control herself and refrain from using the eating disordered behaviors to cope, and instead, turn to some of her other coping skills that are not destructive. Anxious and again today, over-focuses on herself and some of her  coping styles.  Denies any SI or thoughts of other self-harm.   Interventions: Cognitive Behavioral Therapy   Diagnosis:   ICD-10-CM   1. Mixed bipolar I disorder ([place])  F31.60     Plan:   Patient is not signing tx goals on computer screen due  COVID .  Treatment Goals  Long term goal: Develop the ability to recognize, accept, and cope with feelings of depression and anxiety successfully in positive and healthy ways.  Progressing  Patient states she's motivated and is currently trying to better recognize triggers to her depression and anxiety, and to stop them.  Short term goal Learn and implement calming skills to reduce overall tension and moments of increased anxiety.  Strategies: 1)Practice calming strategies that she has used in therapy office on occasions with benefit. Remain open to learning new ones. 2)Take her medications as prescribed. 3) Will learn and practice CBT strategies relating to changing her anxious and depressed thoughts to more hopeful and empowering thoughts.  Progressing Patient progressing as she is working on the 2 strategies above, and also on her own is researching additional strategies.  Next appt in 1-2 weeks                                                                                                                                                                                     Shanon Ace, LCSW

## 2018-10-04 ENCOUNTER — Other Ambulatory Visit: Payer: Self-pay

## 2018-10-04 ENCOUNTER — Ambulatory Visit (INDEPENDENT_AMBULATORY_CARE_PROVIDER_SITE_OTHER): Payer: Medicare Other | Admitting: Neurology

## 2018-10-04 ENCOUNTER — Telehealth: Payer: Self-pay | Admitting: Family Medicine

## 2018-10-04 ENCOUNTER — Encounter: Payer: Self-pay | Admitting: Neurology

## 2018-10-04 VITALS — BP 120/75 | HR 79 | Temp 97.7°F | Ht 64.25 in | Wt 177.0 lb

## 2018-10-04 DIAGNOSIS — F422 Mixed obsessional thoughts and acts: Secondary | ICD-10-CM

## 2018-10-04 DIAGNOSIS — F3160 Bipolar disorder, current episode mixed, unspecified: Secondary | ICD-10-CM | POA: Diagnosis not present

## 2018-10-04 DIAGNOSIS — F502 Bulimia nervosa: Secondary | ICD-10-CM

## 2018-10-04 DIAGNOSIS — G43C0 Periodic headache syndromes in child or adult, not intractable: Secondary | ICD-10-CM | POA: Diagnosis not present

## 2018-10-04 MED ORDER — DIVALPROEX SODIUM ER 500 MG PO TB24: 500.0000 mg | ORAL_TABLET | Freq: Every day | ORAL | 5 refills | Status: DC

## 2018-10-04 NOTE — Telephone Encounter (Signed)
She says that she sees her ENT that prescribes the medicine that makes her nausea and hopefully she will give her something but that appointment is not until 9/28
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
## 2018-10-04 NOTE — Telephone Encounter (Signed)
Patient is calling and states that her previously PCP would give her zofran 4mg  to help with her nausea that is caused by other medications that she takes. She would like to know if Dr. Holly Bodily can refill this.

## 2018-10-04 NOTE — Telephone Encounter (Signed)
Routing to Dr. Corum for Advice? 

## 2018-10-04 NOTE — Telephone Encounter (Signed)
Pt needs to obtain medication from ENT

## 2018-10-05 LAB — COMPREHENSIVE METABOLIC PANEL
ALT: 20 IU/L (ref 0–32)
AST: 23 IU/L (ref 0–40)
Albumin/Globulin Ratio: 2 (ref 1.2–2.2)
Albumin: 4.8 g/dL (ref 3.8–4.9)
Alkaline Phosphatase: 100 IU/L (ref 39–117)
BUN/Creatinine Ratio: 12 (ref 9–23)
BUN: 12 mg/dL (ref 6–24)
Bilirubin Total: 0.2 mg/dL (ref 0.0–1.2)
CO2: 24 mmol/L (ref 20–29)
Calcium: 9.8 mg/dL (ref 8.7–10.2)
Chloride: 100 mmol/L (ref 96–106)
Creatinine, Ser: 0.97 mg/dL (ref 0.57–1.00)
GFR calc Af Amer: 77 mL/min/{1.73_m2} (ref >59)
GFR calc non Af Amer: 67 mL/min/{1.73_m2} (ref >59)
Globulin, Total: 2.4 g/dL (ref 1.5–4.5)
Glucose: 95 mg/dL (ref 65–99)
Potassium: 4.4 mmol/L (ref 3.5–5.2)
Sodium: 139 mmol/L (ref 134–144)
Total Protein: 7.2 g/dL (ref 6.0–8.5)

## 2018-10-05 NOTE — Telephone Encounter (Signed)
Patient is aware of recommendation 

## 2018-10-06 ENCOUNTER — Other Ambulatory Visit: Payer: Self-pay

## 2018-10-06 ENCOUNTER — Ambulatory Visit (INDEPENDENT_AMBULATORY_CARE_PROVIDER_SITE_OTHER): Payer: Medicare Other | Admitting: Psychiatry

## 2018-10-06 ENCOUNTER — Telehealth: Payer: Self-pay | Admitting: Neurology

## 2018-10-06 DIAGNOSIS — F3160 Bipolar disorder, current episode mixed, unspecified: Secondary | ICD-10-CM | POA: Diagnosis not present

## 2018-10-06 MED ORDER — ONDANSETRON 4 MG PO TBDP: 4.0000 mg | ORAL_TABLET | Freq: Three times a day (TID) | ORAL | 1 refills | Status: DC | PRN

## 2018-10-06 NOTE — H&P (View-Only) (Signed)
Referring Provider:  Maryruth Hancock, MD Primary Care Physician:  Maryruth Hancock, MD Primary Gastroenterologist:  Dr. Gala Romney  Chief Complaint  Patient presents with   Gastroesophageal Reflux    Saw [facility] and last EGD ?2017    HPI:   Jennifer [name] is a 53 y.o. female presenting today at the request of Dr. Holly Bodily for GERD.   Patient was seen in the ED on 07/07/2018 for epigastric abdominal pain.  Labs at that time with lipase 49 (normal), CMP normal other than glucose of 101, CBC normal, and negative troponin.  CT abdomen and pelvis without any acute findings to explain epigastric pain.  She did have a moderately large hiatal hernia.   Today she states she has been having a lot of trouble with pain in her upper abdomen and burning in her chest and throat.  Acid reflux and burning in her chest/throat occurs 2-3 times a week.  Pain in epigastric area has been present for several months and is present more often than not.  Goes from dull/aching to stabbing if it is severe.  Was seen in the ED for this in June as described above and tried Bentyl but this did not provide significant relief.  Called her PCP who advised she take Pepcid and Carafate.  Patient think she misunderstood this as she stopped omeprazole and started Pepcid and Carafate.  States she has been on omeprazole for several years.  Was still having some breakthrough reflux symptoms with omeprazole.  Triggers of epigastric pain include spices and spaghetti.  Other times will occur without known trigger.  She does admit to a history of bingeing and purging for most of her adult life.  She is seeing a psychiatrist and a therapist for this. If she purges she will have severe epigastric pain with eating the rest of the day.  No purging for 8 days. Denies hematemesis. Feels that her stomach looks distended at times.  She has had some nausea since starting Depakote.  Has been prescribed Zofran which helps.  No sensation of food hanging in her  throat or esophagus.  Reports a sensation of thickness in her throat which she has been told is related to her medications and is supposed to be seeing ENT for this.  Thinks she may have black stools intermittently. "Really dark." Has been going on a few months.   No ibuprofen other NSAIDs for the last couple months. Was taking ibuprofen 2 a day prior to this for several months. Has started using Tylenol.   Prior EGD in 2017 with [facility]-Per patient ulcers, gastritis, and esophagitis identified.  Diarrhea since starting Latuda and Depakote. Stools are mushy. Will have 4-5 a day. No urgency. No nocturnal BMs.   No blood in the stool. Typically will be after meals. Prior to meds, would have about 2 a day. Were more formed. No recent antibiotics or hospitalizations. Doesn't drink well water. No sick contacts.   Colonoscopy attempted a few months ago. States she could not get through the prep because she was having significant abdominal pain. Was told to take pepto. She asked to reschedule.     Past Medical History:  Diagnosis Date   Anxiety    Binge-eating and purging type anorexia nervosa    Bipolar disorder ([place])    Cystitis, interstitial    Depression    Esophageal polyp    about 20 years ago   GERD (gastroesophageal reflux disease)    OCD (obsessive compulsive  disorder)     Past Surgical History:  Procedure Laterality Date   BLADDER SURGERY     CHOLECYSTECTOMY     ESOPHAGOGASTRODUODENOSCOPY  2017   VOCAL CORD LATERALIZATION, ENDOSCOPIC APPROACH W/ MLB      Current Outpatient Medications  Medication Sig Dispense Refill   clonazePAM (KLONOPIN) 0.5 MG tablet Take 1 tablet (0.5 mg total) by mouth 2 (two) times daily as needed for anxiety. 20 tablet 1   cycloSPORINE (RESTASIS) 0.05 % ophthalmic emulsion 1 drop 2 (two) times daily.     dicyclomine (BENTYL) 20 MG tablet Take 1 tablet (20 mg total) by mouth 3 (three) times daily as needed for spasms (abdominal  cramping). 20 tablet 0   divalproex (DEPAKOTE ER) 500 MG 24 hr tablet Take 1 tablet (500 mg total) by mouth daily. 30 tablet 5   famotidine (PEPCID) 20 MG tablet Take 1 tablet (20 mg total) by mouth 2 (two) times daily. 60 tablet 5   fluticasone (FLONASE) 50 MCG/ACT nasal spray Place 1 spray into both nostrils daily.     fluvoxaMINE (LUVOX) 100 MG tablet Take 3 po qd 270 tablet 1   gabapentin (NEURONTIN) 600 MG tablet Take 1 tablet (600 mg total) by mouth 4 (four) times daily. 120 tablet 5   hydrOXYzine (ATARAX/VISTARIL) 50 MG tablet 1-2 po q6hr prn 90 tablet 5   lurasidone (LATUDA) 40 MG TABS tablet Take 1 tablet (40 mg total) by mouth daily with breakfast. Must take with food. 30 tablet 5   olopatadine (PATANOL) 0.1 % ophthalmic solution 1 drop 2 (two) times daily.     ondansetron (ZOFRAN ODT) 4 MG disintegrating tablet Take 1 tablet (4 mg total) by mouth every 8 (eight) hours as needed for nausea or vomiting. 30 tablet 1   ondansetron (ZOFRAN) 4 MG tablet Take 4-8 mg by mouth every 8 (eight) hours as needed for nausea or vomiting.     sucralfate (CARAFATE) 1 g tablet Take 1 tablet by mouth 2 (two) times a day.     traZODone (DESYREL) 100 MG tablet Take 1-2 tablets (100-200 mg total) by mouth at bedtime as needed for sleep. 60 tablet 5   valACYclovir (VALTREX) 500 MG tablet 500 mg daily.      pantoprazole (PROTONIX) 40 MG tablet Take 1 tablet (40 mg total) by mouth 2 (two) times daily before a meal. 90 tablet 3   No current facility-administered medications for this visit.     Allergies as of 10/07/2018 - Review Complete 10/07/2018  Allergen Reaction Noted   Codeine Hives and Shortness Of Breath 02/15/2013   Sonata [zaleplon] Other (See Comments) 10/22/2017   Meloxicam Other (See Comments) 05/08/2014   Other Nausea Only 10/09/2011   Topamax [topiramate] Other (See Comments) 10/04/2018   Emgality [galcanezumab-gnlm] Rash 10/04/2018   Penicillins Hives, Swelling, and  Rash 10/09/2011    Family History  Problem Relation Age of Onset   Colon cancer Neg Hx    Colon polyps Neg Hx     Social History   Socioeconomic History   Marital status: Married    Spouse name: Not on file   Number of children: Not on file   Years of education: Not on file   Highest education level: Not on file  Occupational History   Not on file  Social Needs   Financial resource strain: Not on file   Food insecurity    Worry: Not on file    Inability: Not on file   Transportation needs  Medical: Not on file    Non-medical: Not on file  Tobacco Use   Smoking status: Current Every Day Smoker    Packs/day: 0.50    Types: Cigarettes    Start date: 01/20/1988   Smokeless tobacco: Never Used  Substance and Sexual Activity   Alcohol use: No    Alcohol/week: 0.0 standard drinks   Drug use: No   Sexual activity: Yes    Birth control/protection: None  Lifestyle   Physical activity    Days per week: Not on file    Minutes per session: Not on file   Stress: Not on file  Relationships   Social connections    Talks on phone: Not on file    Gets together: Not on file    Attends religious service: Not on file    Active member of club or organization: Not on file    Attends meetings of clubs or organizations: Not on file    Relationship status: Not on file   Intimate partner violence    Fear of current or ex partner: Not on file    Emotionally abused: Not on file    Physically abused: Not on file    Forced sexual activity: Not on file  Other Topics Concern   Not on file  Social History Narrative   Still having marital discord.  Last night she had a conversation with her husband and told him she was planning to leave him.  He got upset and said he would do anything to with him.  He has called their pastor and set up a counseling session for Sunday.  She states "I do not know if I will even go.  I feel like I have been trying for a long time and he has  not been trying at all"    Review of Systems: Gen: Denies any fever, chills, lightheadedness, dizziness, presyncope, syncope.  No unintentional weight loss.  CV: Denies chest pain, heart palpitations, peripheral edema  Resp: Denies shortness of breath.  Admits to cough. GI: See HPI GU : Denies urinary burning, urinary hesitancy. Admits to frequency and feeling thirsty. States she is being monitored for diabetes.  MS: Admits to joint pain in knees.  Derm: Denies rash Psych: Admits to depression and anxiety.  Heme: Denies bruising, bleeding  Physical Exam: BP 123/72    Pulse 76    Temp 97.6 F (36.4 C) (Oral)    Ht 5\' 4"  (1.626 m)    Wt 178 lb 9.6 oz (81 kg)    LMP 10/07/2011 Comment: neg preg   BMI 30.66 kg/m  General:   Alert and oriented. Pleasant and cooperative. Well-nourished and well-developed.  Head:  Normocephalic and atraumatic. Eyes:  Without icterus, sclera clear and conjunctiva pink.  Ears:  Normal auditory acuity. Nose:  No deformity, discharge,  or lesions. Lungs:  Clear to auscultation bilaterally. No wheezes, rales, or rhonchi. No distress.  Heart:  S1, S2 present without murmurs appreciated.  Abdomen:  +BS, soft, non-distended. Moderate tenderness to light palpation in the epigastric area. Minimal diffuse tenderness to moderate palpation. No HSM noted. No masses appreciated.  Rectal:  Deferred  Msk:  Symmetrical without gross deformities. Normal posture. Pulses:  Normal pulses noted. Extremities:  Without edema. Neurologic:  Alert and  oriented x4;  grossly normal neurologically. Skin:  Intact without significant lesions or rashes. Psych: Normal mood and affect.

## 2018-10-06 NOTE — Telephone Encounter (Signed)
Called the patient and informed her that I saw she had zofran on her med list. Patient states that she doesn't have any of the medication and reached out to who normally ordered this in past and they advised she discuss this with Dr Brett Fairy. Patient states that she has had issues in the past with starting new medications and having some nausea until she gets used to things. Patient asked if she would be willing to call something in. Patient uses [facility]. Informed her that I would inform Dr Brett Fairy of this and call her back if she doesn't want to otherwise she will send something into the pharmacy she mentioned. Pt verbalized understanding and was appreciative.  Discussed with Dr Brett Fairy she agreed and script is sent for the patient

## 2018-10-06 NOTE — Progress Notes (Signed)
Referring Provider:  Maryruth Hancock, MD Primary Care Physician:  Maryruth Hancock, MD Primary Gastroenterologist:  Dr. Gala Romney  Chief Complaint  Patient presents with   Gastroesophageal Reflux    [facility] and last EGD ?2017    HPI:   Jennifer [name] is a 53 y.o. female presenting today at the request of Dr. Holly Bodily for GERD.   Patient was seen in the ED on 07/07/2018 for epigastric abdominal pain.  Labs at that time with lipase 49 (normal), CMP normal other than glucose of 101, CBC normal, and negative troponin.  CT abdomen and pelvis without any acute findings to explain epigastric pain.  She did have a moderately large hiatal hernia.   Today she states she has been having a lot of trouble with pain in her upper abdomen and burning in her chest and throat.  Acid reflux and burning in her chest/throat occurs 2-3 times a week.  Pain in epigastric area has been present for several months and is present more often than not.  Goes from dull/aching to stabbing if it is severe.  Was seen in the ED for this in June as described above and tried Bentyl but this did not provide significant relief.  Called her PCP who advised she take Pepcid and Carafate.  Patient think she misunderstood this as she stopped omeprazole and started Pepcid and Carafate.  States she has been on omeprazole for several years.  Was still having some breakthrough reflux symptoms with omeprazole.  Triggers of epigastric pain include spices and spaghetti.  Other times will occur without known trigger.  She does admit to a history of bingeing and purging for most of her adult life.  She is seeing a psychiatrist and a therapist for this. If she purges she will have severe epigastric pain with eating the rest of the day.  No purging for 8 days. Denies hematemesis. Feels that her stomach looks distended at times.  She has had some nausea since starting Depakote.  Has been prescribed Zofran which helps.  No sensation of food hanging in her  throat or esophagus.  Reports a sensation of thickness in her throat which she has been told is related to her medications and is supposed to be seeing ENT for this.  Thinks she may have black stools intermittently. "Really dark." Has been going on a few months.   No ibuprofen other NSAIDs for the last couple months. Was taking ibuprofen 2 a day prior to this for several months. Has started using Tylenol.   Prior EGD in 2017 with [facility]-Per patient ulcers, gastritis, and esophagitis identified.  Diarrhea since starting Latuda and Depakote. Stools are mushy. Will have 4-5 a day. No urgency. No nocturnal BMs.   No blood in the stool. Typically will be after meals. Prior to meds, would have about 2 a day. Were more formed. No recent antibiotics or hospitalizations. Doesn't drink well water. No sick contacts.   Colonoscopy attempted a few months ago. States she could not get through the prep because she was having significant abdominal pain. Was told to take pepto. She asked to reschedule.     Past Medical History:  Diagnosis Date   Anxiety    Binge-eating and purging type anorexia nervosa    Bipolar disorder ([place])    Cystitis, interstitial    Depression    Esophageal polyp    about 20 years ago   GERD (gastroesophageal reflux disease)    OCD (obsessive compulsive  disorder)     Past Surgical History:  Procedure Laterality Date   BLADDER SURGERY     CHOLECYSTECTOMY     ESOPHAGOGASTRODUODENOSCOPY  2017   VOCAL CORD LATERALIZATION, ENDOSCOPIC APPROACH W/ MLB      Current Outpatient Medications  Medication Sig Dispense Refill   clonazePAM (KLONOPIN) 0.5 MG tablet Take 1 tablet (0.5 mg total) by mouth 2 (two) times daily as needed for anxiety. 20 tablet 1   cycloSPORINE (RESTASIS) 0.05 % ophthalmic emulsion 1 drop 2 (two) times daily.     dicyclomine (BENTYL) 20 MG tablet Take 1 tablet (20 mg total) by mouth 3 (three) times daily as needed for spasms (abdominal  cramping). 20 tablet 0   divalproex (DEPAKOTE ER) 500 MG 24 hr tablet Take 1 tablet (500 mg total) by mouth daily. 30 tablet 5   famotidine (PEPCID) 20 MG tablet Take 1 tablet (20 mg total) by mouth 2 (two) times daily. 60 tablet 5   fluticasone (FLONASE) 50 MCG/ACT nasal spray Place 1 spray into both nostrils daily.     fluvoxaMINE (LUVOX) 100 MG tablet Take 3 po qd 270 tablet 1   gabapentin (NEURONTIN) 600 MG tablet Take 1 tablet (600 mg total) by mouth 4 (four) times daily. 120 tablet 5   hydrOXYzine (ATARAX/VISTARIL) 50 MG tablet 1-2 po q6hr prn 90 tablet 5   lurasidone (LATUDA) 40 MG TABS tablet Take 1 tablet (40 mg total) by mouth daily with breakfast. Must take with food. 30 tablet 5   olopatadine (PATANOL) 0.1 % ophthalmic solution 1 drop 2 (two) times daily.     ondansetron (ZOFRAN ODT) 4 MG disintegrating tablet Take 1 tablet (4 mg total) by mouth every 8 (eight) hours as needed for nausea or vomiting. 30 tablet 1   ondansetron (ZOFRAN) 4 MG tablet Take 4-8 mg by mouth every 8 (eight) hours as needed for nausea or vomiting.     sucralfate (CARAFATE) 1 g tablet Take 1 tablet by mouth 2 (two) times a day.     traZODone (DESYREL) 100 MG tablet Take 1-2 tablets (100-200 mg total) by mouth at bedtime as needed for sleep. 60 tablet 5   valACYclovir (VALTREX) 500 MG tablet 500 mg daily.      pantoprazole (PROTONIX) 40 MG tablet Take 1 tablet (40 mg total) by mouth 2 (two) times daily before a meal. 90 tablet 3   No current facility-administered medications for this visit.     Allergies as of 10/07/2018 - Review Complete 10/07/2018  Allergen Reaction Noted   Codeine Hives and Shortness Of Breath 02/15/2013   Sonata [zaleplon] Other (See Comments) 10/22/2017   Meloxicam Other (See Comments) 05/08/2014   Other Nausea Only 10/09/2011   Topamax [topiramate] Other (See Comments) 10/04/2018   Emgality [galcanezumab-gnlm] Rash 10/04/2018   Penicillins Hives, Swelling, and  Rash 10/09/2011    Family History  Problem Relation Age of Onset   Colon cancer Neg Hx    Colon polyps Neg Hx     Social History   Socioeconomic History   Marital status: Married    Spouse name: Not on file   Number of children: Not on file   Years of education: Not on file   Highest education level: Not on file  Occupational History   Not on file  Social Needs   Financial resource strain: Not on file   Food insecurity    Worry: Not on file    Inability: Not on file   Transportation needs  Medical: Not on file    Non-medical: Not on file  Tobacco Use   Smoking status: Current Every Day Smoker    Packs/day: 0.50    Types: Cigarettes    Start date: 01/20/1988   Smokeless tobacco: Never Used  Substance and Sexual Activity   Alcohol use: No    Alcohol/week: 0.0 standard drinks   Drug use: No   Sexual activity: Yes    Birth control/protection: None  Lifestyle   Physical activity    Days per week: Not on file    Minutes per session: Not on file   Stress: Not on file  Relationships   Social connections    Talks on phone: Not on file    Gets together: Not on file    Attends religious service: Not on file    Active member of club or organization: Not on file    Attends meetings of clubs or organizations: Not on file    Relationship status: Not on file   Intimate partner violence    Fear of current or ex partner: Not on file    Emotionally abused: Not on file    Physically abused: Not on file    Forced sexual activity: Not on file  Other Topics Concern   Not on file  Social History Narrative   Still having marital discord.  Last night she had a conversation with her husband and told him she was planning to leave him.  He got upset and said he would do anything to with him.  He has called their pastor and set up a counseling session for Sunday.  She states "I do not know if I will even go.  I feel like I have been trying for a long time and he has  not been trying at all"    Review of Systems: Gen: Denies any fever, chills, lightheadedness, dizziness, presyncope, syncope.  No unintentional weight loss.  CV: Denies chest pain, heart palpitations, peripheral edema  Resp: Denies shortness of breath.  Admits to cough. GI: See HPI GU : Denies urinary burning, urinary hesitancy. Admits to frequency and feeling thirsty. States she is being monitored for diabetes.  MS: Admits to joint pain in knees.  Derm: Denies rash Psych: Admits to depression and anxiety.  Heme: Denies bruising, bleeding  Physical Exam: BP 123/72    Pulse 76    Temp 97.6 F (36.4 C) (Oral)    Ht 5\' 4"  (1.626 m)    Wt 178 lb 9.6 oz (81 kg)    LMP 10/07/2011 Comment: neg preg   BMI 30.66 kg/m  General:   Alert and oriented. Pleasant and cooperative. Well-nourished and well-developed.  Head:  Normocephalic and atraumatic. Eyes:  Without icterus, sclera clear and conjunctiva pink.  Ears:  Normal auditory acuity. Nose:  No deformity, discharge,  or lesions. Lungs:  Clear to auscultation bilaterally. No wheezes, rales, or rhonchi. No distress.  Heart:  S1, S2 present without murmurs appreciated.  Abdomen:  +BS, soft, non-distended. Moderate tenderness to light palpation in the epigastric area. Minimal diffuse tenderness to moderate palpation. No HSM noted. No masses appreciated.  Rectal:  Deferred  Msk:  Symmetrical without gross deformities. Normal posture. Pulses:  Normal pulses noted. Extremities:  Without edema. Neurologic:  Alert and  oriented x4;  grossly normal neurologically. Skin:  Intact without significant lesions or rashes. Psych: Normal mood and affect.

## 2018-10-06 NOTE — Progress Notes (Signed)
[facility] Counselor/Therapist Progress Note  Patient ID: Jennifer [name], MRN: [id number],    Date: 10/06/2018  Time Spent: 60 minutes    11:00am to 12:00noon  Treatment Type: Individual Therapy  Reported Symptoms: anxiety, depression, migraines, jumps to conclusions, some racing thoughts earlier in week but better now, no occurrences of binging and purging over last 7 days. Mental Status Exam:  Appearance:   n/a   telehealth     Behavior:  Sharing  Motor:  n/a  telehealth  Speech/Language:   Clear and coherent  Affect:  n/a   telehealth  Mood:  anxious and depressed  Thought process:  goal directed  Thought content:    WNL  Sensory/Perceptual disturbances:    WNL  Orientation:  oriented to person, place, time/date, situation, day of week, month of year and year  Attention:  Good  Concentration:  Fair  Memory:  patient reports some memory issues that she feels may relate to her meds some; states that she has spoken with all  her doctors about this.  Fund of knowledge:   Good  Insight:    Fair  Judgment:   Fair  Impulse Control:  Fair   Risk Assessment: Danger to Self:  No Self-injurious Behavior: No Danger to Others: No Duty to Warn:no Physical Aggression / Violence:No  Access to Firearms a concern: No  Gang Involvement:No   Subjective:  Patient today reporting not feeling well today so had to convert to telehealth.  Anxious, depressed, worrying, jumping to conclusions, headache.  states her memory is worse "on my meds" and reports she has talked with her doctors (PCP Dr. Holly Bodily in [place], neurologist Dr. Brett Fairy, and Ellis Savage).  No binging and purging in last 7 days.  States she has been staying in bed throughout the night rather than getting up and eating. Has reported before that her binging and purging would happen during the night.  Patient says she had "done it more often recently and "I believe I have done well stopping it this week.". Did discuss with her  that if she is not able to remain free of the binging and purging, she will need to see an eating disorder specialist. Using some mindfulness and deep breathing exercises that she states is helpful.  Also says reading her Bible and praying has helped ground more.  Plans to use yoga more as that has helped her in past.  Goal review and progress noted with patient.  Interventions: Cognitive Behavioral Therapy and Mindfulness Meditation  Diagnosis:   ICD-10-CM   1. Mixed bipolar I disorder ([place])  F31.60     Plan: Treatment goals may remain the same for a while until met, however progress or lack of, will be noted each session in "Progress" sections.  Treatment Goals Patient is not signing tx goals on computer screen due COVID.  Long term goal: Develop the ability to recognize, accept, and cope with feelings of depression and anxiety successfully in positive and healthy ways.  Progressing  Patient working to identify and recognize triggers to her anxiety and depression and has had some success this week, especially as she dealt with anxiety over interrupting and working to stop her recent binging and purging.  Has gone 7 days without binging and purging these past 7 days. Is to keep working on this over the next week prior to next appt.  Short term goal Learn and implement calming skills to reduce overall tension and moments of increased anxiety.  Strategies: 1)Practice calming strategies that she has used in therapy office on occasions with benefit. Remain open to learning new ones. 2)Take her medications as prescribed. 3) Will learn and practice CBT strategies relating to changing her anxious and depressed thoughts to more hopeful and empowering thoughts. 4) Continue taking her medications as prescribed by her doctors.  Progressing Patient did show progress this week despite it being a difficult week emotionally.  Her progress includes being able to effectively use some calming  exercises (as noted above in first stragegy) along with a little better self-regulation as she stopped her recent bout of binging and purging for the past 7 days since her last appt.  Talked about what helped and what didn't help, as well as validated her self-regulation. She is to continue this work and we will assess again next session.  Goal review and progress noted with patient.                                                                                                                                                  Next appt in 1-2 weeks.   Shanon Ace, LCSW

## 2018-10-06 NOTE — Telephone Encounter (Signed)
Pt called stating that she is nauseated and vomiting possibly due to the divalproex (DEPAKOTE ER) 500 MG 24 hr tablet and she would like to know if something can be called in for her to help her with the nausea until her body gets used to the Depakote. Please advise.

## 2018-10-07 ENCOUNTER — Other Ambulatory Visit: Payer: Self-pay

## 2018-10-07 ENCOUNTER — Other Ambulatory Visit: Payer: Self-pay | Admitting: *Deleted

## 2018-10-07 ENCOUNTER — Encounter: Payer: Self-pay | Admitting: Gastroenterology

## 2018-10-07 ENCOUNTER — Ambulatory Visit (INDEPENDENT_AMBULATORY_CARE_PROVIDER_SITE_OTHER): Payer: Medicare Other | Admitting: Gastroenterology

## 2018-10-07 ENCOUNTER — Telehealth: Payer: Self-pay | Admitting: *Deleted

## 2018-10-07 ENCOUNTER — Encounter: Payer: Self-pay | Admitting: *Deleted

## 2018-10-07 VITALS — BP 123/72 | HR 76 | Temp 97.6°F | Ht 64.0 in | Wt 178.6 lb

## 2018-10-07 DIAGNOSIS — R1013 Epigastric pain: Secondary | ICD-10-CM

## 2018-10-07 DIAGNOSIS — F502 Bulimia nervosa: Secondary | ICD-10-CM

## 2018-10-07 DIAGNOSIS — R197 Diarrhea, unspecified: Secondary | ICD-10-CM | POA: Insufficient documentation

## 2018-10-07 DIAGNOSIS — K219 Gastro-esophageal reflux disease without esophagitis: Secondary | ICD-10-CM

## 2018-10-07 HISTORY — DX: Epigastric pain: R10.13

## 2018-10-07 MED ORDER — PANTOPRAZOLE SODIUM 40 MG PO TBEC: 40.0000 mg | DELAYED_RELEASE_TABLET | Freq: Two times a day (BID) | ORAL | 3 refills | Status: DC

## 2018-10-07 NOTE — Patient Instructions (Addendum)
Please complete IFOBT when you see what you feel like is black stool. This will check for blood in your stool.   We will get you scheduled for an upper endoscopy with Dr. Gala Romney in the near future.  Please start taking Protonix 40 mg twice daily.  30 minutes before breakfast and 30 minutes before dinner.  You may continue to take Pepcid as needed for breakthrough upper abdominal pain or reflux symptoms.  Continue to avoid all NSAIDs including ibuprofen, Aleve, Advil, Goody powders.  Take Tylenol for pain.  Please follow a low fat/GERD diet.  See handout below.  You may continue to use Bentyl as needed.  This may help with your diarrhea that has started since you started taking Latuda and Depakote.  You could try taking this before meals.  We will plan to see you back after your procedure.  Call if you have questions or concerns prior.  Aliene Altes, PA-C [facility]     Food Choices for Gastroesophageal Reflux Disease, Adult When you have gastroesophageal reflux disease (GERD), the foods you eat and your eating habits are very important. Choosing the right foods can help ease your discomfort. Think about working with a nutrition specialist (dietitian) to help you make good choices. What are tips for following this plan?  Meals  Choose healthy foods that are low in fat, such as fruits, vegetables, whole grains, low-fat dairy products, and lean meat, fish, and poultry.  Eat small meals often instead of 3 large meals a day. Eat your meals slowly, and in a place where you are relaxed. Avoid bending over or lying down until 2-3 hours after eating.  Avoid eating meals 2-3 hours before bed.  Avoid drinking a lot of liquid with meals.  Cook foods using methods other than frying. Bake, grill, or broil food instead.  Avoid or limit: ? Chocolate. ? Peppermint or spearmint. ? Alcohol. ? Pepper. ? Black and decaffeinated coffee. ? Black and decaffeinated tea. ? Bubbly  (carbonated) soft drinks. ? Caffeinated energy drinks and soft drinks.  Limit high-fat foods such as: ? Fatty meat or fried foods. ? Whole milk, cream, butter, or ice cream. ? Nuts and nut butters. ? Pastries, donuts, and sweets made with butter or shortening.  Avoid foods that cause symptoms. These foods may be different for everyone. Common foods that cause symptoms include: ? Tomatoes. ? Oranges, lemons, and limes. ? Peppers. ? Spicy food. ? Onions and garlic. ? Vinegar. Lifestyle  Maintain a healthy weight. Ask your doctor what weight is healthy for you. If you need to lose weight, work with your doctor to do so safely.  Exercise for at least 30 minutes for 5 or more days each week, or as told by your doctor.  Wear loose-fitting clothes.  Do not smoke. If you need help quitting, ask your doctor.  Sleep with the head of your bed higher than your feet. Use a wedge under the mattress or blocks under the bed frame to raise the head of the bed. Summary  When you have gastroesophageal reflux disease (GERD), food and lifestyle choices are very important in easing your symptoms.  Eat small meals often instead of 3 large meals a day. Eat your meals slowly, and in a place where you are relaxed.  Limit high-fat foods such as fatty meat or fried foods.  Avoid bending over or lying down until 2-3 hours after eating.  Avoid peppermint and spearmint, caffeine, alcohol, and chocolate. This information is not intended  to replace advice given to you by your health care provider. Make sure you discuss any questions you have with your health care provider. Document Released: 07/07/2011 Document Revised: 04/28/2018 Document Reviewed: 02/11/2016 Elsevier Patient Education  2020 [employer].

## 2018-10-07 NOTE — Assessment & Plan Note (Addendum)
53 year old female with past medical history significant for GERD, anxiety, depression, bipolar disorder, anorexia, and now bulimia who presents with uncontrolled GERD and epigastric pain.  Reflux symptoms occurring 2-3 times a week.  Epigastric pain has been present daily for several months.  Is dull/aching at times but also gets severe and is stabbing.  Was seen in the ED in June 2020 for the same with lipase normal, CMP normal, CBC normal, CT abdomen and pelvis without acute findings.  Moderately large hiatal hernia was identified.  Has been taking omeprazole for years and was started on Pepcid and Carafate recently. Misunderstood her PCP and has been off omeprazole about 2-3 weeks since starring pepcid and carafate. Continues with symptoms. Long history of bingeing and purging. Has been without purging for 8 days, but will have severe epigastric pain the days she does purge. Denies hematemesis. Reports possible intermittent black stools. Denies brbpr. No unintentional weight loss. Has stopped NSAIDs for the last couple months, but was taking 2 ibuprofen daily for several months prior. No sensation of food hanging in her throat. Reports sensation of "thickness" in her throat which she has been told is related to her medications and is supposed to see ENT soon. Reports prior EGD in 2017 with [facility] with ulcers, gastritis, and esophagitis.   I suspect NSAIDs and regular purging are playing a role in patients ongoing symptoms. Differentials include gastritis, duodenitis, esophagitis, H. pylori, and PUD. Can't exclude malignancy.  Proceed with EGD with Dr. Gala Romney with propofol in the near future. The risks, benefits, and alternatives have been discussed in detail with patient. They have stated understanding and desire to proceed.  IFOBT due to ?black stools. Start Protonix 40 mg BID 30 minutes before breakfast and dinner.  Pepcid 20 mg as needed for breakthrough symptoms.  May complete course of carafate as  prescribed by PCP.  Follow GERD diet. Handout provided.  Avoid all NSAIDs Requesting records from [facility].  Follow-up after procedure.

## 2018-10-07 NOTE — Telephone Encounter (Signed)
Notification or Prior Authorization is not required for the requested services  This [employer] Medicare Advantage members plan does not currently require a prior authorization for these services. If you have general questions about the prior authorization requirements, please call us at [phone] or visit [email or web address] > Clinician Resources > Advance and Admission Notification Requirements. The number above acknowledges your notification. Please write this number down for future reference. Notification is not a guarantee of coverage or payment.

## 2018-10-07 NOTE — Telephone Encounter (Signed)
Lmovm. PRE-OP and covid-19 scheduled for 9/25 at 12:45pm for pre-op and 1:45pm for covid-19. Left detailed message on VM (okay per DPR)

## 2018-10-07 NOTE — Assessment & Plan Note (Signed)
Addressed under abdominal pain, epigastric. 

## 2018-10-07 NOTE — Assessment & Plan Note (Addendum)
Patient reporting diarrhea since starting Latuda and Depakote. Stools are mushy, 4-5 a day. No urgency. No nocturnal BMs.   No blood in the stool. Typically will be after meals. No recent antibiotics or hospitalizations. Doesn't drink well water. No sick contacts.   Suspect med effect. She is also without a gallbladder which may be playing a role. Patient had been prescribed Bentyl for her abdominal pain in the ED. Has not been taking as it doesn't help the abdominal pain. Advised she try taking before meals to see if this decreases her diarrhea.  Follow low fat diet.

## 2018-10-09 NOTE — Progress Notes (Signed)
CC'ED TO PCP 

## 2018-10-11 ENCOUNTER — Ambulatory Visit (INDEPENDENT_AMBULATORY_CARE_PROVIDER_SITE_OTHER): Payer: Medicare Other | Admitting: Gastroenterology

## 2018-10-11 ENCOUNTER — Encounter: Payer: Self-pay | Admitting: Gastroenterology

## 2018-10-11 ENCOUNTER — Other Ambulatory Visit: Payer: Self-pay

## 2018-10-11 ENCOUNTER — Telehealth: Payer: Self-pay

## 2018-10-11 DIAGNOSIS — K921 Melena: Secondary | ICD-10-CM

## 2018-10-11 LAB — IFOBT (OCCULT BLOOD): IFOBT: NEGATIVE

## 2018-10-11 NOTE — Telephone Encounter (Signed)
Opened in error

## 2018-10-12 ENCOUNTER — Telehealth: Payer: Self-pay | Admitting: Internal Medicine

## 2018-10-12 NOTE — Telephone Encounter (Signed)
[phone]  Patient called and said she has something on her butt cheek that needs to be drained,  Please call her,  Her pcp is not in the office. Told her this is not something I believe our office could do at her procedure

## 2018-10-12 NOTE — Telephone Encounter (Signed)
Spoke with pt. Pt has c/o inflamed area near her rectum. C/o nausea, redness, mildly warm to the touch. Pt states that this area has been drained several times before. She has seen a PCP for it, a Gyn doctor and isn't sure if she spoke with a dermatologist. When pt saw her Gyn doctor, she was told the inflamed area is closer to her rectum than her vaginal area and she should see a gastroenterologist. Pt isn't sure what she should do about this inflamed area. When asked, if pt was running a fever, pt said she is going to take her temp and document if she has a fever. Pt is aware that she may need to proceed to an urgent care or ED since her PCP isn't available today. Please advise.

## 2018-10-13 ENCOUNTER — Ambulatory Visit (INDEPENDENT_AMBULATORY_CARE_PROVIDER_SITE_OTHER): Payer: Medicare Other | Admitting: Psychiatry

## 2018-10-13 ENCOUNTER — Encounter (HOSPITAL_COMMUNITY): Payer: Self-pay

## 2018-10-13 ENCOUNTER — Other Ambulatory Visit: Payer: Self-pay

## 2018-10-13 DIAGNOSIS — F3160 Bipolar disorder, current episode mixed, unspecified: Secondary | ICD-10-CM | POA: Diagnosis not present

## 2018-10-13 NOTE — Progress Notes (Signed)
[facility] Counselor/Therapist Progress Note  Patient ID: ALFIE ALDERFER, MRN: [id number],    Date: 10/13/2018  Time Spent: 60 minutes  11:00am to 12:00noon  Virtual Visit Video Note Connected with patient by a video enabled telemedicine/telehealth application or telephone, with their informed consent, and verified patient privacy and that I am speaking with the correct person using two identifiers. I discussed the limitations, risks, security and privacy concerns of performing psychotherapy and management service by telephone and the availability of in person appointments. I also discussed with the patient that there may be a patient responsible charge related to this service. The patient expressed understanding and agreed to proceed. I discussed the treatment planning with the patient. The patient was provided an opportunity to ask questions and all were answered. The patient agreed with the plan and demonstrated an understanding of the instructions. The patient was advised to call  our office if  symptoms worsen or feel they are in a crisis state and need immediate contact.   Therapist Location: [name] Patient Location: home   Treatment Type: Individual Therapy  Reported Symptoms: depression, anxiety, frustration  Mental Status Exam:  Appearance:   Casual     Behavior:  Appropriate and Sharing  Motor:  Normal  Speech/Language:   Normal Rate  Affect:  Depressed and anxious  Mood:  anxious, depressed and frustrated   Thought process:  normal  Thought content:    WNL  Sensory/Perceptual disturbances:    WNL  Orientation:  oriented to person, place, time/date, situation, day of week, month of year and year  Attention:  Good  Concentration:  Good  Memory:  Patient reports some memory issues and she has communicated with her doctors about this  Fund of knowledge:   Good  Insight:    Fair  Judgment:   Fair  Impulse Control:  Fair   Risk Assessment: Danger  to Self:  No Self-injurious Behavior: No Danger to Others: No Duty to Warn:no Physical Aggression / Violence:No  Access to Firearms a concern: No  Gang Involvement:No   Subjective: Patient today reports depression, frustration, and anxiety.  Depression "has not been quite as bad this week".  Still dealing with some health issues and to have an endoscopy next week.  Upset with husband that he is not going with her for this procedure. Communication issues with her mom, and patient ends up feeling hurt.   Interventions: Cognitive Behavioral Therapy  Diagnosis:   ICD-10-CM   1. Mixed bipolar I disorder ([place])  F31.60      Plan: Treatment goals may remain the same for a while until met, however progress or lack of, will be noted each session in "Progress" sections.  Treatment Goals Patient is not signing tx goals on computer screen due COVID.  Long term goal: Develop the ability to recognize, accept, and cope with feelings of depression and anxiety successfully in positive and healthy ways.   Short term goal Learn and implement calming skills to reduce overall tension and moments of increased anxiety.  Strategies: 1)Practice calming strategies that she has used in therapy office on occasions with benefit. Remain open to learning new ones. (breathing, 5 senses, counting, pause button) 2)Take her medications as prescribed. 3) Will learn and practice CBT strategies relating to changing her anxious and depressed thoughts to more hopeful and empowering thoughts. 4) Continue taking her medications as prescribed by her doctors.  Progressing Worked today more on triggers to her anxiety and depression and  being able to interrupt them.  Triggers for her include some social situations, arguments with husband, negative thought about self.  Rehearsed how she can monitor her automatic thoughts better and have some success in interrupting the triggers.  Often feels "I can't do this and we  worked on her motivation and being open to having some possible success by changing her thoughts. Has not had further binging and purging this week. Denies any SI.   To remain on meds as prescribed.  Continue work on self-regulation and has shown some improvement at times.  Goal review and progress noted with patient.   Next appt within 2 wks.    Shanon Ace, LCSW

## 2018-10-13 NOTE — Telephone Encounter (Signed)
If patient can be seen by her PCP ASAP that would be the best thing. She needs someone to look at this. I can not make recommendations over the phone. If she can not see PCP ASAP, we can try to schedule her for the first available with a provider, but it doesn't appear there are any available openings within the next week.

## 2018-10-13 NOTE — Telephone Encounter (Signed)
Noted. Spoke with pt. Pt is going to see if she can get in with her PCP. Pt states she has a busy week with appointments. She will contact our office back, if she isn't able to be seen.

## 2018-10-14 ENCOUNTER — Encounter (HOSPITAL_COMMUNITY)
Admission: RE | Admit: 2018-10-14 | Discharge: 2018-10-14 | Disposition: A | Discharge status: Home / Self Care | Payer: Medicare Other | Source: Ambulatory Visit | Attending: Internal Medicine | Admitting: Internal Medicine

## 2018-10-14 ENCOUNTER — Other Ambulatory Visit (HOSPITAL_COMMUNITY)
Admission: RE | Admit: 2018-10-14 | Discharge: 2018-10-14 | Disposition: A | Discharge status: Home / Self Care | Payer: Medicare Other | Source: Ambulatory Visit | Attending: Internal Medicine | Admitting: Internal Medicine

## 2018-10-14 DIAGNOSIS — Z20828 Contact with and (suspected) exposure to other viral communicable diseases: Secondary | ICD-10-CM | POA: Insufficient documentation

## 2018-10-14 DIAGNOSIS — Z01812 Encounter for preprocedural laboratory examination: Secondary | ICD-10-CM | POA: Insufficient documentation

## 2018-10-14 LAB — SARS CORONAVIRUS 2 (TAT 6-24 HRS): SARS Coronavirus 2: NEGATIVE

## 2018-10-17 ENCOUNTER — Ambulatory Visit (HOSPITAL_COMMUNITY)
Admission: RE | Admit: 2018-10-17 | Discharge: 2018-10-17 | Disposition: A | Discharge status: Home / Self Care | Payer: Medicare Other | Attending: Internal Medicine | Admitting: Internal Medicine

## 2018-10-17 ENCOUNTER — Encounter (HOSPITAL_COMMUNITY)
Admission: RE | Disposition: A | Discharge status: Home / Self Care | Payer: Self-pay | Source: Home / Self Care | Attending: Internal Medicine

## 2018-10-17 ENCOUNTER — Other Ambulatory Visit: Payer: Self-pay

## 2018-10-17 ENCOUNTER — Ambulatory Visit (HOSPITAL_COMMUNITY): Payer: Medicare Other | Admitting: Anesthesiology

## 2018-10-17 DIAGNOSIS — F17210 Nicotine dependence, cigarettes, uncomplicated: Secondary | ICD-10-CM | POA: Diagnosis not present

## 2018-10-17 DIAGNOSIS — F319 Bipolar disorder, unspecified: Secondary | ICD-10-CM | POA: Diagnosis not present

## 2018-10-17 DIAGNOSIS — R1013 Epigastric pain: Secondary | ICD-10-CM | POA: Diagnosis not present

## 2018-10-17 DIAGNOSIS — K449 Diaphragmatic hernia without obstruction or gangrene: Secondary | ICD-10-CM | POA: Diagnosis not present

## 2018-10-17 DIAGNOSIS — F419 Anxiety disorder, unspecified: Secondary | ICD-10-CM | POA: Diagnosis not present

## 2018-10-17 DIAGNOSIS — K210 Gastro-esophageal reflux disease with esophagitis: Secondary | ICD-10-CM | POA: Insufficient documentation

## 2018-10-17 DIAGNOSIS — Z8719 Personal history of other diseases of the digestive system: Secondary | ICD-10-CM | POA: Diagnosis not present

## 2018-10-17 DIAGNOSIS — K209 Esophagitis, unspecified: Secondary | ICD-10-CM | POA: Diagnosis not present

## 2018-10-17 DIAGNOSIS — F429 Obsessive-compulsive disorder, unspecified: Secondary | ICD-10-CM | POA: Diagnosis not present

## 2018-10-17 DIAGNOSIS — Z79899 Other long term (current) drug therapy: Secondary | ICD-10-CM | POA: Insufficient documentation

## 2018-10-17 HISTORY — PX: ESOPHAGOGASTRODUODENOSCOPY (EGD) WITH PROPOFOL: SHX5813

## 2018-10-17 SURGERY — ESOPHAGOGASTRODUODENOSCOPY (EGD) WITH PROPOFOL
Anesthesia: General

## 2018-10-17 MED ORDER — PROMETHAZINE HCL 25 MG/ML IJ SOLN: 6.2500 mg | INTRAMUSCULAR | Status: DC | PRN

## 2018-10-17 MED ORDER — FENTANYL CITRATE (PF) 100 MCG/2ML IJ SOLN
INTRAMUSCULAR | Status: AC
  Filled 2018-10-17: qty 2

## 2018-10-17 MED ORDER — PROPOFOL 500 MG/50ML IV EMUL
INTRAVENOUS | Status: DC | PRN
  Administered 2018-10-17: 200 ug/kg/min via INTRAVENOUS

## 2018-10-17 MED ORDER — PROPOFOL 10 MG/ML IV BOLUS
INTRAVENOUS | Status: DC | PRN
  Administered 2018-10-17: 100 mg via INTRAVENOUS

## 2018-10-17 MED ORDER — MIDAZOLAM HCL 2 MG/2ML IJ SOLN: 0.5000 mg | Freq: Once | INTRAMUSCULAR | Status: DC | PRN

## 2018-10-17 MED ORDER — PROPOFOL 10 MG/ML IV BOLUS
INTRAVENOUS | Status: AC
  Filled 2018-10-17: qty 40

## 2018-10-17 MED ORDER — LACTATED RINGERS IV SOLN
INTRAVENOUS | Status: DC
  Administered 2018-10-17: 08:00:00 via INTRAVENOUS

## 2018-10-17 MED ORDER — LIDOCAINE HCL (PF) 2 % IJ SOLN
INTRAMUSCULAR | Status: AC
  Filled 2018-10-17: qty 10

## 2018-10-17 MED ORDER — LIDOCAINE HCL (CARDIAC) PF 100 MG/5ML IV SOSY
PREFILLED_SYRINGE | INTRAVENOUS | Status: DC | PRN
  Administered 2018-10-17: 50 mg via INTRAVENOUS

## 2018-10-17 MED ORDER — FENTANYL CITRATE (PF) 100 MCG/2ML IJ SOLN
INTRAMUSCULAR | Status: DC | PRN
  Administered 2018-10-17: 50 ug via INTRAVENOUS

## 2018-10-17 NOTE — Transfer of Care (Signed)
Immediate Anesthesia Transfer of Care Note  Patient: Jennifer [name]  Procedure(s) Performed: ESOPHAGOGASTRODUODENOSCOPY (EGD) WITH PROPOFOL (N/A )  Patient Location: PACU  Anesthesia Type:MAC  Level of Consciousness: awake, alert  and oriented  Airway & Oxygen Therapy: Patient Spontanous Breathing and Patient connected to nasal cannula oxygen  Post-op Assessment: Report given to RN, Post -op Vital signs reviewed and stable and Patient moving all extremities X 4  Post vital signs: Reviewed and stable  Last Vitals:  Vitals Value Taken Time  BP    Temp    Pulse 91 10/17/18 0829  Resp    SpO2 81 % 10/17/18 0829  Vitals shown include unvalidated device data.  Last Pain:  Vitals:   10/17/18 0745  PainSc: 0-No pain         Complications: No apparent anesthesia complications

## 2018-10-17 NOTE — Anesthesia Preprocedure Evaluation (Signed)
Anesthesia Evaluation  Patient identified by MRN, date of birth, ID band Patient awake    Reviewed: Allergy & Precautions, NPO status , Patient's Chart, lab work & pertinent test results  Airway Mallampati: II  TM Distance: >3 FB Neck ROM: Full    Dental no notable dental hx. (+) Teeth Intact   Pulmonary neg pulmonary ROS, Current SmokerPatient did not abstain from smoking.,    Pulmonary exam normal breath sounds clear to auscultation       Cardiovascular Exercise Tolerance: Good negative cardio ROS Normal cardiovascular examI Rhythm:Regular Rate:Normal     Neuro/Psych  Headaches, PSYCHIATRIC DISORDERS Anxiety Depression Bipolar Disorder Reports 6 IP psych stays -last 2017 H/o anorexia in past   GI/Hepatic Neg liver ROS, GERD  Medicated and Controlled,  Endo/Other  negative endocrine ROS  Renal/GU negative Renal ROS  negative genitourinary   Musculoskeletal negative musculoskeletal ROS (+)   Abdominal   Peds negative pediatric ROS (+)  Hematology negative hematology ROS (+)   Anesthesia Other Findings   Reproductive/Obstetrics negative OB ROS                             Anesthesia Physical Anesthesia Plan  ASA: II  Anesthesia Plan: General   Post-op Pain Management:    Induction: Intravenous  PONV Risk Score and Plan: 2 and Propofol infusion, TIVA and Treatment may vary due to age or medical condition  Airway Management Planned: Nasal Cannula and Simple Face Mask  Additional Equipment:   Intra-op Plan:   Post-operative Plan:   Informed Consent: I have reviewed the patients History and Physical, chart, labs and discussed the procedure including the risks, benefits and alternatives for the proposed anesthesia with the patient or authorized representative who has indicated his/her understanding and acceptance.     Dental advisory given  Plan Discussed with:  CRNA  Anesthesia Plan Comments: (Plan Full PPE use  Plan GA with GETA as needed d/w pt -WTP with same after Q&A)        Anesthesia Quick Evaluation

## 2018-10-17 NOTE — Interval H&P Note (Signed)
History and Physical Interval Note:  10/17/2018 8:05 AM  Jennifer [name]  has presented today for surgery, with the diagnosis of GERD, epigastric pain.  The various methods of treatment have been discussed with the patient and family. After consideration of risks, benefits and other options for treatment, the patient has consented to  Procedure(s) with comments: ESOPHAGOGASTRODUODENOSCOPY (EGD) WITH PROPOFOL (N/A) - 8:15am as a surgical intervention.  The patient's history has been reviewed, patient examined, no change in status, stable for surgery.  I have reviewed the patient's chart and labs.  Questions were answered to the patient's satisfaction.     Sible Straley Epigastric pain improved with Protonix and Carafate.  No dysphagia.  Diagnostic EGD today per plan.  The risks, benefits, limitations, alternatives and imponderables have been reviewed with the patient. Potential for esophageal dilation, biopsy, etc. have also been reviewed.  Questions have been answered. All parties agreeable.

## 2018-10-17 NOTE — Op Note (Signed)
[facility] Patient Name: Jennifer [name] Procedure Date: 10/17/2018 8:15 AM MRN: [id number] Date of Birth: Mar 29, 1965 Attending MD: Norvel Richards , MD CSN: [id number] Age: 53 Admit Type: Outpatient Procedure:                Upper GI endoscopy Indications:              Dyspepsia Providers:                Norvel Richards, MD, Janeece Riggers, RN, Nelma Rothman, Technician Referring MD:              Medicines:                Propofol per Anesthesia Complications:            No immediate complications. Estimated Blood Loss:     Estimated blood loss: none. Procedure:                Pre-Anesthesia Assessment:                           - Prior to the procedure, a History and Physical                            was performed, and patient medications and                            allergies were reviewed. The patient's tolerance of                            previous anesthesia was also reviewed. The risks                            and benefits of the procedure and the sedation                            options and risks were discussed with the patient.                            All questions were answered, and informed consent                            was obtained. Prior Anticoagulants: The patient has                            taken no previous anticoagulant or antiplatelet                            agents. ASA Grade Assessment: II - A patient with                            mild systemic disease. After reviewing the risks  and benefits, the patient was deemed in                            satisfactory condition to undergo the procedure.                           After obtaining informed consent, the endoscope was                            passed under direct vision. Throughout the                            procedure, the patient's blood pressure, pulse, and                            oxygen saturations were monitored  continuously. The                            [id number]-[id number]) scope was introduced through the                            mouth, and advanced to the second part of duodenum.                            The upper GI endoscopy was accomplished without                            difficulty. The patient tolerated the procedure                            well. Scope In: 8:20:38 AM Scope Out: 8:23:00 AM Total Procedure Duration: 0 hours 2 minutes 22 seconds  Findings:      Esophagitis was found. Couple of erosions within 1 cm of the GE       junction. No Barrett's epithelium seen.      A small hiatal hernia was present.      The exam was otherwise without abnormality.      The duodenal bulb and second portion of the duodenum were normal. Impression:               -Mild erosive reflux esophagitis. Symptoms already                            much improved on Protonix and Carafate. Recent                            hemoglobin 14.2. stool occult blood negative.                            Ingestion of Pepto-Bismol likely related to the                            complaint of black stools.                           -  Small hiatal hernia.                           - The examination was otherwise normal.                           - Normal duodenal bulb and second portion of the                            duodenum.                           - No specimens collected. Moderate Sedation:      Moderate (conscious) sedation was administered by the endoscopy nurse       and supervised by the endoscopist. The following parameters were       monitored: oxygen saturation, heart rate, blood pressure, respiratory       rate, EKG, adequacy of pulmonary ventilation, and response to care. Recommendation:           - Patient has a contact number available for                            emergencies. The signs and symptoms of potential                            delayed complications were discussed with the                             patient. Return to normal activities tomorrow.                            Written discharge instructions were provided to the                            patient.                           - Resume previous diet.                           - Continue present medications. Finish current                            prescription of Carafate then discontinue. Continue                            Protonix 40 mg daily indefinitely                           - Advance diet as tolerated.                           -Office visit with [place] in 3 months Procedure Code(s):        --- Professional ---  [id number], Esophagogastroduodenoscopy, flexible,                            transoral; diagnostic, including collection of                            specimen(s) by brushing or washing, when performed                            (separate procedure) Diagnosis Code(s):        --- Professional ---                           K20.9, Esophagitis, unspecified                           K44.9, Diaphragmatic hernia without obstruction or                            gangrene                           R10.13, Epigastric pain CPT copyright 2019 [employer]. All rights reserved. The codes documented in this report are preliminary and upon coder review may  be revised to meet current compliance requirements. [name]. [name], MD Norvel Richards, MD 10/17/2018 8:40:34 AM This report has been signed electronically. Number of Addenda: 0

## 2018-10-17 NOTE — Anesthesia Postprocedure Evaluation (Signed)
Anesthesia Post Note Late Entry for [phone]  Patient: TANNETTE CLEMENSON  Procedure(s) Performed: ESOPHAGOGASTRODUODENOSCOPY (EGD) WITH PROPOFOL (N/A )  Anesthesia Type: General Level of consciousness: awake and alert and oriented Pain management: pain level controlled Vital Signs Assessment: post-procedure vital signs reviewed and stable Respiratory status: spontaneous breathing Cardiovascular status: stable Postop Assessment: no apparent nausea or vomiting Anesthetic complications: no     Last Vitals:  Vitals:   10/17/18 0838 10/17/18 0847  BP: 112/85 111/75  Pulse: 72 71  Resp: 16 18  Temp:  (!) 36.4 C  SpO2: 95% 96%    Last Pain:  Vitals:   10/17/18 0847  TempSrc: Oral  PainSc: 0-No pain                 ADAMS, AMY A

## 2018-10-17 NOTE — Discharge Instructions (Signed)
Gastroesophageal Reflux Disease, Adult Gastroesophageal reflux (GER) happens when acid from the stomach flows up into the tube that connects the mouth and the stomach (esophagus). Normally, food travels down the esophagus and stays in the stomach to be digested. However, when a person has GER, food and stomach acid sometimes move back up into the esophagus. If this becomes a more serious problem, the person may be diagnosed with a disease called gastroesophageal reflux disease (GERD). GERD occurs when the reflux: Happens often. Causes frequent or severe symptoms. Causes problems such as damage to the esophagus. When stomach acid comes in contact with the esophagus, the acid may cause soreness (inflammation) in the esophagus. Over time, GERD may create small holes (ulcers) in the lining of the esophagus. What are the causes? This condition is caused by a problem with the muscle between the esophagus and the stomach (lower esophageal sphincter, or LES). Normally, the LES muscle closes after food passes through the esophagus to the stomach. When the LES is weakened or abnormal, it does not close properly, and that allows food and stomach acid to go back up into the esophagus. The LES can be weakened by certain dietary substances, medicines, and medical conditions, including: Tobacco use. Pregnancy. Having a hiatal hernia. Alcohol use. Certain foods and beverages, such as coffee, chocolate, onions, and peppermint. What increases the risk? You are more likely to develop this condition if you: Have an increased body weight. Have a connective tissue disorder. Use NSAID medicines. What are the signs or symptoms? Symptoms of this condition include: Heartburn. Difficult or painful swallowing. The feeling of having a lump in the throat. Abitter taste in the mouth. Bad breath. Having a large amount of saliva. Having an upset or bloated stomach. Belching. Chest pain. Different conditions can cause  chest pain. Make sure you see your health care provider if you experience chest pain. Shortness of breath or wheezing. Ongoing (chronic) cough or a night-time cough. Wearing away of tooth enamel. Weight loss. How is this diagnosed? Your health care provider will take a medical history and perform a physical exam. To determine if you have mild or severe GERD, your health care provider may also monitor how you respond to treatment. You may also have tests, including: A test to examine your stomach and esophagus with a small camera (endoscopy). A test thatmeasures the acidity level in your esophagus. A test thatmeasures how much pressure is on your esophagus. A barium swallow or modified barium swallow test to show the shape, size, and functioning of your esophagus. How is this treated? The goal of treatment is to help relieve your symptoms and to prevent complications. Treatment for this condition may vary depending on how severe your symptoms are. Your health care provider may recommend: Changes to your diet. Medicine. Surgery. Follow these instructions at home: Eating and drinking  Follow a diet as recommended by your health care provider. This may involve avoiding foods and drinks such as: Coffee and tea (with or without caffeine). Drinks that containalcohol. Energy drinks and sports drinks. Carbonated drinks or sodas. Chocolate and cocoa. Peppermint and mint flavorings. Garlic and onions. Horseradish. Spicy and acidic foods, including peppers, chili powder, curry powder, vinegar, hot sauces, and barbecue sauce. Citrus fruit juices and citrus fruits, such as oranges, lemons, and limes. Tomato-based foods, such as red sauce, chili, salsa, and pizza with red sauce. Fried and fatty foods, such as donuts, french fries, potato chips, and high-fat dressings. High-fat meats, such as hot dogs and fatty  cuts of red and white meats, such as rib eye steak, sausage, ham, and bacon. High-fat  dairy items, such as whole milk, butter, and cream cheese. Eat small, frequent meals instead of large meals. Avoid drinking large amounts of liquid with your meals. Avoid eating meals during the 2-3 hours before bedtime. Avoid lying down right after you eat. Do not exercise right after you eat. Lifestyle  Do not use any products that contain nicotine or tobacco, such as cigarettes, e-cigarettes, and chewing tobacco. If you need help quitting, ask your health care provider. Try to reduce your stress by using methods such as yoga or meditation. If you need help reducing stress, ask your health care provider. If you are overweight, reduce your weight to an amount that is healthy for you. Ask your health care provider for guidance about a safe weight loss goal. General instructions Pay attention to any changes in your symptoms. Take over-the-counter and prescription medicines only as told by your health care provider. Do not take aspirin, ibuprofen, or other NSAIDs unless your health care provider told you to do so. Wear loose-fitting clothing. Do not wear anything tight around your waist that causes pressure on your abdomen. Raise (elevate) the head of your bed about 6 inches (15 cm). Avoid bending over if this makes your symptoms worse. Keep all follow-up visits as told by your health care provider. This is important. Contact a health care provider if: You have: New symptoms. Unexplained weight loss. Difficulty swallowing or it hurts to swallow. Wheezing or a persistent cough. A hoarse voice. Your symptoms do not improve with treatment. Get help right away if you: Have pain in your arms, neck, jaw, teeth, or back. Feel sweaty, dizzy, or light-headed. Have chest pain or shortness of breath. Vomit and your vomit looks like blood or coffee grounds. Faint. Have stool that is bloody or black. Cannot swallow, drink, or eat. Summary Gastroesophageal reflux happens when acid from the stomach  flows up into the esophagus. GERD is a disease in which the reflux happens often, causes frequent or severe symptoms, or causes problems such as damage to the esophagus. Treatment for this condition may vary depending on how severe your symptoms are. Your health care provider may recommend diet and lifestyle changes, medicine, or surgery. Contact a health care provider if you have new or worsening symptoms. Take over-the-counter and prescription medicines only as told by your health care provider. Do not take aspirin, ibuprofen, or other NSAIDs unless your health care provider told you to do so. Keep all follow-up visits as told by your health care provider. This is important. This information is not intended to replace advice given to you by your health care provider. Make sure you discuss any questions you have with your health care provider. Document Released: 10/15/2004 Document Revised: 07/14/2017 Document Reviewed: 07/14/2017 Elsevier Patient Education  2020 [place]. Upper Endoscopy, Adult, Care After This sheet gives you information about how to care for yourself after your procedure. Your health care provider may also give you more specific instructions. If you have problems or questions, contact your health care provider. What can I expect after the procedure? After the procedure, it is common to have:  A sore throat.  Mild stomach pain or discomfort.  Bloating.  Nausea. Follow these instructions at home:   Follow instructions from your health care provider about what to eat or drink after your procedure.  Return to your normal activities as told by your health care provider.  Ask your health care provider what activities are safe for you.  Take over-the-counter and prescription medicines only as told by your health care provider.  Do not drive for 24 hours if you were given a sedative during your procedure.  Keep all follow-up visits as told by your health care provider.  This is important. Contact a health care provider if you have:  A sore throat that lasts longer than one day.  Trouble swallowing. Get help right away if:  You vomit blood or your vomit looks like coffee grounds.  You have: ? A fever. ? Bloody, black, or tarry stools. ? A severe sore throat or you cannot swallow. ? Difficulty breathing. ? Severe pain in your chest or abdomen. Summary  After the procedure, it is common to have a sore throat, mild stomach discomfort, bloating, and nausea.  Do not drive for 24 hours if you were given a sedative during the procedure.  Follow instructions from your health care provider about what to eat or drink after your procedure.  Return to your normal activities as told by your health care provider. This information is not intended to replace advice given to you by your health care provider. Make sure you discuss any questions you have with your health care provider. Document Released: 07/07/2011 Document Revised: 06/29/2017 Document Reviewed: 06/07/2017 Elsevier Patient Education  [place]. EGD Discharge instructions Please read the instructions outlined below and refer to this sheet in the next few weeks. These discharge instructions provide you with general information on caring for yourself after you leave the hospital. Your doctor may also give you specific instructions. While your treatment has been planned according to the most current medical practices available, unavoidable complications occasionally occur. If you have any problems or questions after discharge, please call your doctor. ACTIVITY  You may resume your regular activity but move at a slower pace for the next 24 hours.   Take frequent rest periods for the next 24 hours.   Walking will help expel (get rid of) the air and reduce the bloated feeling in your abdomen.   No driving for 24 hours (because of the anesthesia (medicine) used during the test).   You may  shower.   Do not sign any important legal documents or operate any machinery for 24 hours (because of the anesthesia used during the test).  NUTRITION  Drink plenty of fluids.   You may resume your normal diet.   Begin with a light meal and progress to your normal diet.   Avoid alcoholic beverages for 24 hours or as instructed by your caregiver.  MEDICATIONS  You may resume your normal medications unless your caregiver tells you otherwise.  WHAT YOU CAN EXPECT TODAY  You may experience abdominal discomfort such as a feeling of fullness or gas pains.  FOLLOW-UP  Your doctor will discuss the results of your test with you.  SEEK IMMEDIATE MEDICAL ATTENTION IF ANY OF THE FOLLOWING OCCUR:  Excessive nausea (feeling sick to your stomach) and/or vomiting.   Severe abdominal pain and distention (swelling).   Trouble swallowing.   Temperature over 101 F (37.8 C).   Rectal bleeding or vomiting of blood.    GERD information provided  Continue Protonix 40 mg daily indefinitely  Complete current prescription of Carafate and then stop  Office visit with [place] in 3 months  At patient's request I called mother, Terrence Dupont, at [phone].  Was unable to reach her.

## 2018-10-18 ENCOUNTER — Encounter: Payer: Self-pay | Admitting: Physician Assistant

## 2018-10-18 ENCOUNTER — Ambulatory Visit (INDEPENDENT_AMBULATORY_CARE_PROVIDER_SITE_OTHER): Payer: Medicare Other | Admitting: Physician Assistant

## 2018-10-18 DIAGNOSIS — F422 Mixed obsessional thoughts and acts: Secondary | ICD-10-CM | POA: Diagnosis not present

## 2018-10-18 DIAGNOSIS — F3160 Bipolar disorder, current episode mixed, unspecified: Secondary | ICD-10-CM | POA: Diagnosis not present

## 2018-10-18 DIAGNOSIS — F411 Generalized anxiety disorder: Secondary | ICD-10-CM

## 2018-10-18 DIAGNOSIS — G4700 Insomnia, unspecified: Secondary | ICD-10-CM

## 2018-10-18 DIAGNOSIS — F509 Eating disorder, unspecified: Secondary | ICD-10-CM

## 2018-10-18 DIAGNOSIS — G43909 Migraine, unspecified, not intractable, without status migrainosus: Secondary | ICD-10-CM

## 2018-10-18 NOTE — Progress Notes (Signed)
[facility] Check  Patient ID: Jennifer [name],  MRN: [id number]  PCP: Maryruth Hancock, MD  Date of Evaluation: 10/18/2018 Time spent:30 minutes  Chief Complaint:  Chief Complaint    Follow-up     Virtual Visit via Telephone Note  I connected with patient by a video enabled telemedicine application or telephone, with their informed consent, and verified patient privacy and that I am speaking with the correct person using two identifiers.  I am private, in my office and the patient is home.  I discussed the limitations, risks, security and privacy concerns of performing an evaluation and management service by telephone and the availability of in person appointments. I also discussed with the patient that there may be a patient responsible charge related to this service. The patient expressed understanding and agreed to proceed.   I discussed the assessment and treatment plan with the patient. The patient was provided an opportunity to ask questions and all were answered. The patient agreed with the plan and demonstrated an understanding of the instructions.   The patient was advised to call back or seek an in-person evaluation if the symptoms worsen or if the condition fails to improve as anticipated.  I provided 30 minutes of non-face-to-face time during this encounter.  HISTORY/CURRENT STATUS:  HPI For routine med check.  Is 19 days purge free!  "I did it on my own.  There was only 1 hospital in [place] that would take me and my insurance, for eating d/o.  I couldn't do that, so I quit purging on my own."  Dr. Brett Fairy, neuro, put her on Depakote ER 2 weeks ago for migraines and she's only had 1 migraine since then.  The rescue medicine helped a lot quicker than it had in the past too.  "I am really pleased that it is helping that much.  And the doctor told me it might help with my mood some too.  I think it did in the beginning.  But it is not really now.  I had more energy and  felt more at peace and content when I first started taking it but now I feel more unsettled.  I am not having full-blown mania but I do feel more irritable."    Denies increased energy with decreased need for sleep.  She is having some trouble sleeping though.  The trazodone usually helps really well.  And adding the Depakote has also made her drowsy and she is able to easier.  She is not having as many racing thoughts.  She denies impulsivity or risky behaviors.  No hallucinations.  No grandiosity.  No increased spending.  She is able to enjoy things.  There are days when she does not want to do much though.  She feels that those times are as much due to her physically not feeling well as much as the mental health issues.  She does continue to so and read for hobbies.  Her energy and motivation are pretty good most of the time.  She is not isolating any more than is necessary due to the coronavirus pandemic.  Denies suicidal or homicidal thoughts.  Denies dizziness, syncope, seizures, numbness, tingling, tremor, tics, unsteady gait, slurred speech, confusion. Denies muscle or joint pain, stiffness, or dystonia.  Says the OCD has kicked up more since the last visit.  It is mostly related to her counting.  She has to do things in increments of 5, like checking the doors or windows to make sure  they are locked.  Individual Medical History/ Review of Systems: Changes? :Yes  had endoscopy yesterday.  Dx w/ GERD, esophagitis, hiatal hernia   Past medications for mental health diagnoses include: Risperdal, Seroquel, Prozac, Zoloft, Latuda, Wellbutrin, Lamictal, Depakote, Xanax, Ambien, trazodone, Trileptal, Luvox, Topamax, Elavil, Pamelor, BuSpar, doxazosin, prazosin, lithium, Vraylar, [name]  Allergies: Codeine, Sonata [zaleplon], Meloxicam, Other, Topamax [topiramate], Emgality [galcanezumab-gnlm], and Penicillins   Current Medications:  Current Outpatient Medications:  .  acetaminophen (TYLENOL) 500  MG tablet, Take 1,000 mg by mouth every 6 (six) hours as needed for moderate pain or headache., Disp: , Rfl:  .  clonazePAM (KLONOPIN) 0.5 MG tablet, Take 1 tablet (0.5 mg total) by mouth 2 (two) times daily as needed for anxiety., Disp: 20 tablet, Rfl: 1 .  cycloSPORINE (RESTASIS) 0.05 % ophthalmic emulsion, Place 1 drop into both eyes 2 (two) times daily. , Disp: , Rfl:  .  dicyclomine (BENTYL) 20 MG tablet, Take 1 tablet (20 mg total) by mouth 3 (three) times daily as needed for spasms (abdominal cramping)., Disp: 20 tablet, Rfl: 0 .  divalproex (DEPAKOTE ER) 500 MG 24 hr tablet, Take 1 tablet (500 mg total) by mouth daily., Disp: 30 tablet, Rfl: 5 .  famotidine (PEPCID) 20 MG tablet, Take 1 tablet (20 mg total) by mouth 2 (two) times daily., Disp: 60 tablet, Rfl: 5 .  fluticasone (FLONASE) 50 MCG/ACT nasal spray, Place 1 spray into both nostrils daily as needed for allergies. , Disp: , Rfl:  .  fluvoxaMINE (LUVOX) 100 MG tablet, Take 3 po qd (Patient taking differently: Take 300 mg by mouth daily. ), Disp: 270 tablet, Rfl: 1 .  gabapentin (NEURONTIN) 600 MG tablet, Take 1 tablet (600 mg total) by mouth 4 (four) times daily. (Patient taking differently: Take 1,200 mg by mouth 2 (two) times daily. ), Disp: 120 tablet, Rfl: 5 .  hydrOXYzine (ATARAX/VISTARIL) 50 MG tablet, 1-2 po q6hr prn (Patient taking differently: Take 50-100 mg by mouth every 6 (six) hours as needed for anxiety. ), Disp: 90 tablet, Rfl: 5 .  lurasidone (LATUDA) 40 MG TABS tablet, Take 1 tablet (40 mg total) by mouth daily with breakfast. Must take with food. (Patient taking differently: Take 40 mg by mouth daily after supper. ), Disp: 30 tablet, Rfl: 5 .  olopatadine (PATANOL) 0.1 % ophthalmic solution, Place 1 drop into both eyes 2 (two) times daily. , Disp: , Rfl:  .  ondansetron (ZOFRAN ODT) 4 MG disintegrating tablet, Take 1 tablet (4 mg total) by mouth every 8 (eight) hours as needed for nausea or vomiting., Disp: 30 tablet,  Rfl: 1 .  pantoprazole (PROTONIX) 40 MG tablet, Take 1 tablet (40 mg total) by mouth 2 (two) times daily before a meal., Disp: 90 tablet, Rfl: 3 .  rizatriptan (MAXALT) 10 MG tablet, Take 10 mg by mouth as needed for migraine. May repeat in 2 hours if needed, Disp: , Rfl:  .  sucralfate (CARAFATE) 1 g tablet, Take 1 tablet by mouth 2 (two) times a day., Disp: , Rfl:  .  traZODone (DESYREL) 100 MG tablet, Take 1-2 tablets (100-200 mg total) by mouth at bedtime as needed for sleep., Disp: 60 tablet, Rfl: 5 .  valACYclovir (VALTREX) 500 MG tablet, Take 500 mg by mouth daily as needed (outbreak). , Disp: , Rfl:  Medication Side Effects: none  Family Medical/ Social History: Changes? No  MENTAL HEALTH EXAM:  Last menstrual period 10/07/2011.There is no height or weight on file to calculate  BMI.  General Appearance: Unable to assess  Eye Contact:  Unable to assess  Speech:  Clear and Coherent  Volume:  Normal  Mood:  Euthymic  Affect:  Unable to assess  Thought Process:  Goal Directed  Orientation:  Full (Time, Place, and Person)  Thought Content: Logical   Suicidal Thoughts:  No  Homicidal Thoughts:  No  Memory:  WNL  Judgement:  Good  Insight:  Good  Psychomotor Activity:  Unable to assess  Concentration:  Concentration: Good  Recall:  Good  Fund of Knowledge: Good  Language: Good  Assets:  Desire for Improvement  ADL's:  Intact  Cognition: WNL  Prognosis:  Good    DIAGNOSES:    ICD-10-CM   1. Mixed bipolar I disorder (HCC)  F31.60   2. Mixed obsessional thoughts and acts  F42.2   3. Insomnia, unspecified type  G47.00   4. Eating disorder, unspecified type  F50.9   5. Generalized anxiety disorder  F41.1   6. Migraine without status migrainosus, not intractable, unspecified migraine type  G43.909     Receiving Psychotherapy: Yes With Rinaldo Cloud, LCSW   RECOMMENDATIONS:  I spent 30 minutes with her phone to phone contact and 50% of that time was in counseling.  We  discussed the new diagnoses of gastritis that is most likely related to the purging.  I am happy that she has been able to go 19 days without purging and encouraged her to continue. We also discussed the Depakote.  I think that is a great treatment for multiple reasons.  I have been hesitant to prescribe that due to her eating disorder and fear of weight gain but hopefully she is under much better control now that she sees the improvement that the drug can give her.  I have sent a note to Dr. Brett Fairy asking if it is okay to increase the Depakote ER to 1000 mg.  If it is okay with her then I will get a valproic acid level in 1 to 2 weeks after the dose has been increased.  The patient understands this plan and will stay on 500 mg for now. Continue Luvox 300 mg nightly for now.  As far as the worsening of the OCD again, I think she should work on behavioral issues that have caused that to be worse.  She will discuss that with Rinaldo Cloud at the next visit. Continue Klonopin 0.5 mg twice daily as needed. Continue gabapentin 600 mg, 2 p.o. around lunchtime and then 2 p.o. nightly. Continue hydroxyzine 50 mg 1 or 2 every 6 hours as needed anxiety. Continue Latuda 40 mg q. evening with food. Continue trazodone 100 mg, 1-2 nightly as needed. Sleep hygiene discussed. Continue psychotherapy with Rinaldo Cloud, LCSW. Return in 4 to 6 weeks.  [name], PA-C

## 2018-10-19 ENCOUNTER — Encounter (HOSPITAL_COMMUNITY): Payer: Self-pay | Admitting: Internal Medicine

## 2018-10-20 ENCOUNTER — Ambulatory Visit: Payer: Medicare Other | Admitting: Psychiatry

## 2018-10-20 ENCOUNTER — Other Ambulatory Visit: Payer: Self-pay

## 2018-10-21 ENCOUNTER — Telehealth: Payer: Self-pay | Admitting: Physician Assistant

## 2018-10-21 NOTE — Telephone Encounter (Signed)
Pt called to report she is having a very sharp pin sticking hurting burning pain in right toe. It comes and goes. Was not sure if this could be due to some med she is taking. Please advise

## 2018-10-24 ENCOUNTER — Other Ambulatory Visit: Payer: Self-pay

## 2018-10-24 ENCOUNTER — Ambulatory Visit (INDEPENDENT_AMBULATORY_CARE_PROVIDER_SITE_OTHER): Payer: Medicare Other | Admitting: Otolaryngology

## 2018-10-24 DIAGNOSIS — H9209 Otalgia, unspecified ear: Secondary | ICD-10-CM

## 2018-10-24 DIAGNOSIS — R490 Dysphonia: Secondary | ICD-10-CM | POA: Diagnosis not present

## 2018-10-24 DIAGNOSIS — J381 Polyp of vocal cord and larynx: Secondary | ICD-10-CM

## 2018-10-24 NOTE — Telephone Encounter (Signed)
I haven't heard back from the neurologist. Let's leave things as they are for now, since the Trazodone is helping that problem.   The meds she's on won't affect her toe, that I'm aware of.

## 2018-10-25 NOTE — Telephone Encounter (Signed)
Patient agreed and will call back with further concerns

## 2018-10-26 ENCOUNTER — Emergency Department (HOSPITAL_COMMUNITY)
Admission: EM | Admit: 2018-10-26 | Discharge: 2018-10-26 | Discharge status: Against Medical Advice | Payer: Medicare Other | Attending: Emergency Medicine | Admitting: Emergency Medicine

## 2018-10-26 ENCOUNTER — Encounter (HOSPITAL_COMMUNITY): Payer: Self-pay | Admitting: Emergency Medicine

## 2018-10-26 ENCOUNTER — Telehealth: Payer: Self-pay | Admitting: Internal Medicine

## 2018-10-26 ENCOUNTER — Other Ambulatory Visit: Payer: Self-pay

## 2018-10-26 DIAGNOSIS — R1013 Epigastric pain: Secondary | ICD-10-CM | POA: Insufficient documentation

## 2018-10-26 DIAGNOSIS — F17210 Nicotine dependence, cigarettes, uncomplicated: Secondary | ICD-10-CM | POA: Insufficient documentation

## 2018-10-26 DIAGNOSIS — Z5321 Procedure and treatment not carried out due to patient leaving prior to being seen by health care provider: Secondary | ICD-10-CM | POA: Diagnosis not present

## 2018-10-26 DIAGNOSIS — R110 Nausea: Secondary | ICD-10-CM | POA: Insufficient documentation

## 2018-10-26 DIAGNOSIS — Z79899 Other long term (current) drug therapy: Secondary | ICD-10-CM | POA: Insufficient documentation

## 2018-10-26 MED ORDER — SODIUM CHLORIDE 0.9% FLUSH
3.0000 mL | Freq: Once | INTRAVENOUS | Status: AC
  Administered 2018-10-27: 3 mL via INTRAVENOUS

## 2018-10-26 NOTE — Telephone Encounter (Signed)
Pt called with multiple questions and needs to speak with the nurse. [phone]

## 2018-10-26 NOTE — ED Triage Notes (Signed)
Pt c/o abd pain with n/v. Pt states she has taken her meds with no relief.

## 2018-10-26 NOTE — Telephone Encounter (Signed)
Spoke with pt. She was in a lot of pain. Pt wasn't able to talk without breaking up her sentences due to the pain. Pt states she's going to go to the ED.

## 2018-10-26 NOTE — ED Triage Notes (Signed)
Epigastric abd pain since 2030 last night.  Has taken mylanta, pepcid and bentyl with no relief.

## 2018-10-27 ENCOUNTER — Emergency Department (HOSPITAL_COMMUNITY)
Admission: EM | Admit: 2018-10-27 | Discharge: 2018-10-27 | Disposition: A | Discharge status: Home / Self Care | Payer: Medicare Other | Source: Home / Self Care | Attending: Emergency Medicine | Admitting: Emergency Medicine

## 2018-10-27 ENCOUNTER — Ambulatory Visit: Payer: Medicare Other | Admitting: Psychiatry

## 2018-10-27 DIAGNOSIS — R110 Nausea: Secondary | ICD-10-CM

## 2018-10-27 DIAGNOSIS — R1013 Epigastric pain: Secondary | ICD-10-CM

## 2018-10-27 LAB — CBC WITH DIFFERENTIAL/PLATELET
Abs Immature Granulocytes: 0.01 10*3/uL (ref 0.00–0.07)
Basophils Absolute: 0.1 10*3/uL (ref 0.0–0.1)
Basophils Relative: 1 %
Eosinophils Absolute: 0.2 10*3/uL (ref 0.0–0.5)
Eosinophils Relative: 3 %
HCT: 41.1 % (ref 36.0–46.0)
Hemoglobin: 13.5 g/dL (ref 12.0–15.0)
Immature Granulocytes: 0 %
Lymphocytes Relative: 41 %
Lymphs Abs: 3.1 10*3/uL (ref 0.7–4.0)
MCH: 29.5 pg (ref 26.0–34.0)
MCHC: 32.8 g/dL (ref 30.0–36.0)
MCV: 89.7 fL (ref 80.0–100.0)
Monocytes Absolute: 0.8 10*3/uL (ref 0.1–1.0)
Monocytes Relative: 11 %
Neutro Abs: 3.4 10*3/uL (ref 1.7–7.7)
Neutrophils Relative %: 44 %
Platelets: 201 10*3/uL (ref 150–400)
RBC: 4.58 MIL/uL (ref 3.87–5.11)
RDW: 13.5 % (ref 11.5–15.5)
WBC: 7.5 10*3/uL (ref 4.0–10.5)
nRBC: 0 % (ref 0.0–0.2)

## 2018-10-27 LAB — COMPREHENSIVE METABOLIC PANEL
ALT: 15 U/L (ref 0–44)
AST: 19 U/L (ref 15–41)
Albumin: 4.4 g/dL (ref 3.5–5.0)
Alkaline Phosphatase: 77 U/L (ref 38–126)
Anion gap: 8 (ref 5–15)
BUN: 10 mg/dL (ref 6–20)
CO2: 28 mmol/L (ref 22–32)
Calcium: 9 mg/dL (ref 8.9–10.3)
Chloride: 99 mmol/L (ref 98–111)
Creatinine, Ser: 0.82 mg/dL (ref 0.44–1.00)
GFR calc Af Amer: >60 mL/min (ref >60)
GFR calc non Af Amer: >60 mL/min (ref >60)
Glucose, Bld: 101 mg/dL — ABNORMAL HIGH (ref 70–99)
Potassium: 3.6 mmol/L (ref 3.5–5.1)
Sodium: 135 mmol/L (ref 135–145)
Total Bilirubin: 0.2 mg/dL — ABNORMAL LOW (ref 0.3–1.2)
Total Protein: 7.3 g/dL (ref 6.5–8.1)

## 2018-10-27 LAB — URINALYSIS, ROUTINE W REFLEX MICROSCOPIC
Bilirubin Urine: NEGATIVE
Glucose, UA: NEGATIVE mg/dL
Hgb urine dipstick: NEGATIVE
Ketones, ur: NEGATIVE mg/dL
Leukocytes,Ua: NEGATIVE
Nitrite: NEGATIVE
Protein, ur: NEGATIVE mg/dL
Specific Gravity, Urine: 1.011 (ref 1.005–1.030)
pH: 6 (ref 5.0–8.0)

## 2018-10-27 LAB — LIPASE, BLOOD: Lipase: 34 U/L (ref 11–51)

## 2018-10-27 MED ORDER — SODIUM CHLORIDE 0.9 % IV BOLUS
1000.0000 mL | Freq: Once | INTRAVENOUS | Status: AC
  Administered 2018-10-27: 1000 mL via INTRAVENOUS

## 2018-10-27 MED ORDER — ALUM & MAG HYDROXIDE-SIMETH 200-200-20 MG/5ML PO SUSP
30.0000 mL | Freq: Once | ORAL | Status: AC
  Administered 2018-10-27: 02:00:00 30 mL via ORAL
  Filled 2018-10-27: qty 30

## 2018-10-27 MED ORDER — FAMOTIDINE IN NACL 20-0.9 MG/50ML-% IV SOLN
20.0000 mg | Freq: Once | INTRAVENOUS | Status: AC
  Administered 2018-10-27: 20 mg via INTRAVENOUS
  Filled 2018-10-27: qty 50

## 2018-10-27 MED ORDER — ONDANSETRON HCL 4 MG/2ML IJ SOLN
4.0000 mg | Freq: Once | INTRAMUSCULAR | Status: AC
  Administered 2018-10-27: 4 mg via INTRAVENOUS
  Filled 2018-10-27: qty 2

## 2018-10-27 MED ORDER — LIDOCAINE VISCOUS HCL 2 % MT SOLN
15.0000 mL | Freq: Once | OROMUCOSAL | Status: AC
  Administered 2018-10-27: 02:00:00 15 mL via ORAL
  Filled 2018-10-27: qty 15

## 2018-10-27 NOTE — Psych (Signed)
Virtual Visit via Video Note  I connected with Jennifer [name] on 09/22/18 at 10:00 AM EDT by a video enabled telemedicine application and verified that I am speaking with the correct person using two identifiers.   I discussed the limitations of evaluation and management by telemedicine and the availability of in person appointments. The patient expressed understanding and agreed to proceed.    I discussed the assessment and treatment plan with the patient. The patient was provided an opportunity to ask questions and all were answered. The patient agreed with the plan and demonstrated an understanding of the instructions.   The patient was advised to call back or seek an in-person evaluation if the symptoms worsen or if the condition fails to improve as anticipated.  I provided 40 minutes of non-face-to-face time during this encounter.   Jennifer [name] J Jennifer [name], LCMHCA, LCASA   Pt reports to [facility] per counselor, Jennifer [name]. Pt is tangential throughout the CCA and is very tearful. Pt reports she is struggling in her marriage; "I feel like he is gas lighting me. He never does anything wrong." Pt reports she has tried to leave 5x. Pt reports she has been purging but not binging and her therapist is threatening to discharge her if she does not stop. Pt reports her eating disorder is her main concern. Pt shares she has family problems with her niece and mother due to niece "cussed me out and my mother takes her side. My mother talks to me like I'm stupid." Pt continues to report her eating disorder is her biggest problem and she wants help for it. [facility] is not equipped for treatment for eating disorders. Pt reports she would like a group for eating disorders. Pt has Medicare which limits providers. Cln provides follow-ups for Jennifer [name], at [place] [phone]) and [facility] [phone]).   Jennifer [name], LCMHCA, LCASA

## 2018-10-27 NOTE — ED Provider Notes (Signed)
[facility] EMERGENCY DEPARTMENT Provider Note   CSN: [id number] Arrival date & time: 10/26/18  2219    History   Chief Complaint Chief Complaint  Patient presents with  . Abdominal Pain    HPI Jennifer [name] is a 53 y.o. female.   The history is provided by the patient.  Abdominal Pain She has history of bipolar disorder, GERD and had recent endoscopy showing esophagitis currently being treated with sucralfate and pantoprazole.  Throughout the day, she has had some epigastric pain which got worse through the day.  There was associated nausea with dry heaves but no vomiting.  There is no radiation of pain to the back or chest or shoulder.  Pain has actually subsided since she has been in the ED.  Current pain is rated at 3/10, prior to coming to the ED pain was at 9/10.  Past Medical History:  Diagnosis Date  . Anxiety   . Binge-eating and purging type anorexia nervosa   . Bipolar disorder ([place])   . Cystitis, interstitial   . Depression   . Esophageal polyp    about 20 years ago  . GERD (gastroesophageal reflux disease)   . OCD (obsessive compulsive disorder)     Patient Active Problem List   Diagnosis Date Noted  . Abdominal pain, epigastric 10/07/2018  . Diarrhea 10/07/2018  . Periodic headache syndrome, not intractable 09/12/2018  . Bulimia 09/12/2018  . Gastroesophageal reflux disease 09/12/2018  . Sore throat 09/12/2018  . Migraine without status migrainosus, not intractable 09/12/2018  . Generalized anxiety disorder 09/27/2017  . Mixed bipolar I disorder ([place]) 12/05/2015  . OCD (obsessive compulsive disorder) 12/04/2015  . Bipolar affective disorder ([place]) 12/04/2015  . Bipolar affective disorder, current episode manic without psychotic symptoms ([place]) 12/04/2015  . Palpitations 05/25/2014  . Chest pain 05/25/2014  . Cellulitis and abscess of buttock 02/15/2013  . Depression 08/09/1975    Past Surgical History:  Procedure Laterality Date  . BLADDER  SURGERY    . CHOLECYSTECTOMY    . ESOPHAGOGASTRODUODENOSCOPY  2017  . ESOPHAGOGASTRODUODENOSCOPY (EGD) WITH PROPOFOL N/A 10/17/2018   Procedure: ESOPHAGOGASTRODUODENOSCOPY (EGD) WITH PROPOFOL;  Surgeon: Daneil Dolin, MD;  Location: [facility] ENDO SUITE;  Service: Endoscopy;  Laterality: N/A;  8:15am  . VOCAL CORD LATERALIZATION, ENDOSCOPIC APPROACH W/ MLB       OB History    Gravida  3   Para      Term      Preterm      AB  3   Living  0     SAB  1   TAB  2   Ectopic      Multiple      Live Births               Home Medications    Prior to Admission medications   Medication Sig Start Date End Date Taking? Authorizing Provider  acetaminophen (TYLENOL) 500 MG tablet Take 1,000 mg by mouth every 6 (six) hours as needed for moderate pain or headache.    [provider]  clonazePAM (KLONOPIN) 0.5 MG tablet Take 1 tablet (0.5 mg total) by mouth 2 (two) times daily as needed for anxiety. 04/18/18   [name] T, PA-C  cycloSPORINE (RESTASIS) 0.05 % ophthalmic emulsion Place 1 drop into both eyes 2 (two) times daily.     [provider]  dicyclomine (BENTYL) 20 MG tablet Take 1 tablet (20 mg total) by mouth 3 (three) times daily as needed  for spasms (abdominal cramping). 07/07/18   Long, Wonda Olds, MD  divalproex (DEPAKOTE ER) 500 MG 24 hr tablet Take 1 tablet (500 mg total) by mouth daily. 10/04/18   Dohmeier, Asencion Partridge, MD  famotidine (PEPCID) 20 MG tablet Take 1 tablet (20 mg total) by mouth 2 (two) times daily. 09/12/18   Corum, Rex Kras, MD  fluticasone (FLONASE) 50 MCG/ACT nasal spray Place 1 spray into both nostrils daily as needed for allergies.     [provider]  fluvoxaMINE (LUVOX) 100 MG tablet Take 3 po qd Patient taking differently: Take 300 mg by mouth daily.  05/16/18   [name] T, PA-C  gabapentin (NEURONTIN) 600 MG tablet Take 1 tablet (600 mg total) by mouth 4 (four) times daily. Patient taking differently: Take 1,200 mg by mouth 2  (two) times daily.  04/18/18   [name] T, PA-C  hydrOXYzine (ATARAX/VISTARIL) 50 MG tablet 1-2 po q6hr prn Patient taking differently: Take 50-100 mg by mouth every 6 (six) hours as needed for anxiety.  03/02/18   [name] T, PA-C  lurasidone (LATUDA) 40 MG TABS tablet Take 1 tablet (40 mg total) by mouth daily with breakfast. Must take with food. Patient taking differently: Take 40 mg by mouth daily after supper.  05/16/18   [name] T, PA-C  olopatadine (PATANOL) 0.1 % ophthalmic solution Place 1 drop into both eyes 2 (two) times daily.     [provider]  ondansetron (ZOFRAN ODT) 4 MG disintegrating tablet Take 1 tablet (4 mg total) by mouth every 8 (eight) hours as needed for nausea or vomiting. 10/06/18   Dohmeier, Asencion Partridge, MD  pantoprazole (PROTONIX) 40 MG tablet Take 1 tablet (40 mg total) by mouth 2 (two) times daily before a meal. 10/07/18   Erenest Rasher, PA-C  rizatriptan (MAXALT) 10 MG tablet Take 10 mg by mouth as needed for migraine. May repeat in 2 hours if needed    [provider]  sucralfate (CARAFATE) 1 g tablet Take 1 tablet by mouth 2 (two) times a day. 04/16/18   [provider]  traZODone (DESYREL) 100 MG tablet Take 1-2 tablets (100-200 mg total) by mouth at bedtime as needed for sleep. 03/02/18   Addison Lank, PA-C  valACYclovir (VALTREX) 500 MG tablet Take 500 mg by mouth daily as needed (outbreak).  01/03/18   [provider]    Family History Family History  Problem Relation Age of Onset  . Colon cancer Neg Hx   . Colon polyps Neg Hx     Social History Social History   Tobacco Use  . Smoking status: Current Every Day Smoker    Packs/day: 0.50    Years: 20.00    Pack years: 10.00    Types: Cigarettes    Start date: 01/20/1988  . Smokeless tobacco: Never Used  Substance Use Topics  . Alcohol use: No    Alcohol/week: 0.0 standard drinks  . Drug use: No     Allergies   Codeine, Sonata [zaleplon],  Meloxicam, Other, Topamax [topiramate], Emgality [galcanezumab-gnlm], and Penicillins   Review of Systems Review of Systems  Gastrointestinal: Positive for abdominal pain.  All other systems reviewed and are negative.    Physical Exam Updated Vital Signs BP 126/81   Pulse 84   Temp 98.1 F (36.7 C)   Resp 17   LMP 10/07/2011 Comment: neg preg  SpO2 100%   Physical Exam Vitals signs and nursing note reviewed.    53 year  old female, resting comfortably and in no acute distress. Vital signs are normal. Oxygen saturation is 100%, which is normal. Head is normocephalic and atraumatic. PERRLA, EOMI. Oropharynx is clear. Neck is nontender and supple without adenopathy or JVD. Back is nontender and there is no CVA tenderness. Lungs are clear without rales, wheezes, or rhonchi. Chest is nontender. Heart has regular rate and rhythm without murmur. Abdomen is soft, flat, with mild to moderate epigastric tenderness.  There is no rebound or guarding.  There are no masses or hepatosplenomegaly and peristalsis is hyopoactive. Extremities have no cyanosis or edema, full range of motion is present. Skin is warm and dry without rash. Neurologic: Mental status is normal, cranial nerves are intact, there are no motor or sensory deficits.  ED Treatments / Results  Labs (all labs ordered are listed, but only abnormal results are displayed) Labs Reviewed  COMPREHENSIVE METABOLIC PANEL - Abnormal; Notable for the following components:      Result Value   Glucose, Bld 101 (*)    Total Bilirubin 0.2 (*)    All other components within normal limits  LIPASE, BLOOD  CBC WITH DIFFERENTIAL/PLATELET  URINALYSIS, ROUTINE W REFLEX MICROSCOPIC   Procedures Procedures   Medications Ordered in ED Medications  sodium chloride 0.9 % bolus 1,000 mL (has no administration in time range)  alum & mag hydroxide-simeth (MAALOX/MYLANTA) 200-200-20 MG/5ML suspension 30 mL (has no administration in time  range)    And  lidocaine (XYLOCAINE) 2 % viscous mouth solution 15 mL (has no administration in time range)  famotidine (PEPCID) IVPB 20 mg premix (has no administration in time range)  ondansetron (ZOFRAN) injection 4 mg (has no administration in time range)  sodium chloride flush (NS) 0.9 % injection 3 mL (3 mLs Intravenous Given 10/27/18 0233)     Initial Impression / Assessment and Plan / ED Course  I have reviewed the triage vital signs and the nursing notes.  Pertinent lab results that were available during my care of the patient were reviewed by me and considered in my medical decision making (see chart for details).  Epigastric discomfort probably exacerbation of esophagitis, possible gastritis or peptic ulcer disease.  Old records are reviewed confirming recent endoscopy showing esophagitis.  Screening labs are reassuring.  She is given IV fluids, a GI cocktail and an dose of famotidine and will be reassessed.  She feels much better after above-noted treatment.  On further questioning, she is only taking sucralfate once a day.  She does have famotidine at home.  She is advised to continue taking pantoprazole twice a day, start taking famotidine twice a day, and increase sucralfate to 3 times a day.  She is encouraged to stop smoking.  Follow-up with her gastroenterologist.  Final Clinical Impressions(s) / ED Diagnoses   Final diagnoses:  Epigastric pain  Nausea    ED Discharge Orders    None       Delora Fuel, MD [id number] [phone]

## 2018-10-27 NOTE — Discharge Instructions (Addendum)
Try to stop smoking.  Continue taking pantoprazole twice a day.  Increase sucralfate to 3 times a day.  Start taking famotidine twice a day for the next week.

## 2018-10-31 ENCOUNTER — Ambulatory Visit (INDEPENDENT_AMBULATORY_CARE_PROVIDER_SITE_OTHER)
Admission: EM | Admit: 2018-10-31 | Discharge: 2018-10-31 | Disposition: A | Discharge status: Home / Self Care | Payer: Medicare Other | Source: Home / Self Care

## 2018-10-31 ENCOUNTER — Other Ambulatory Visit: Payer: Self-pay

## 2018-10-31 ENCOUNTER — Encounter (HOSPITAL_COMMUNITY): Payer: Self-pay | Admitting: Emergency Medicine

## 2018-10-31 ENCOUNTER — Emergency Department (HOSPITAL_COMMUNITY)
Admission: EM | Admit: 2018-10-31 | Discharge: 2018-10-31 | Disposition: A | Discharge status: Home / Self Care | Payer: Medicare Other | Attending: Emergency Medicine | Admitting: Emergency Medicine

## 2018-10-31 DIAGNOSIS — Z5321 Procedure and treatment not carried out due to patient leaving prior to being seen by health care provider: Secondary | ICD-10-CM | POA: Insufficient documentation

## 2018-10-31 DIAGNOSIS — R531 Weakness: Secondary | ICD-10-CM

## 2018-10-31 DIAGNOSIS — R110 Nausea: Secondary | ICD-10-CM

## 2018-10-31 DIAGNOSIS — R0789 Other chest pain: Secondary | ICD-10-CM | POA: Insufficient documentation

## 2018-10-31 DIAGNOSIS — R079 Chest pain, unspecified: Secondary | ICD-10-CM

## 2018-10-31 NOTE — Discharge Instructions (Addendum)
Recommending further evaluation and management in the ED.  Cannot rule out cardiac cause or blood clot in urgent care setting.  Patient and husband aware.  Will go by private vehicle to [facility] ED.

## 2018-10-31 NOTE — ED Triage Notes (Signed)
Pain in center of chest since around lunch time. Sent by urgent care for cardiac workup.  Had EKG at urgent care normal sinus rhythm.

## 2018-10-31 NOTE — ED Provider Notes (Signed)
  [place]   [id number] 10/31/18 Arrival Time: 1603  Abbreviated note:  CC: CHEST PAIN  SUBJECTIVE:  Jennifer [name] is a 53 y.o. female hx significant for anxiety, binge-eating/ purging, IC, depression, esophageal polyp, GERD, and OCD, who presents with complaint of abrupt chest that began this moring.  Denies a precipitating event, trauma.  Localizes chest pain to the substernal region.   Rates pain as 3/10.   Reports worsening symptoms with walking up the stairs. Reports radiating symptoms into RT side of neck.  Complains of associated nausea, SOB with climbing stairs, vomiting, palpitations, weakness, acute on chronic episodes of diarrhea.  Denies fever, chills, abdominal pain, changes in bladder habits, diaphoresis.  Does admit to tobacco use.  Has been trying to quit.    ROS: As per HPI.  All other pertinent ROS negative.    Past Medical History:  Diagnosis Date  . Anxiety   . Binge-eating and purging type anorexia nervosa   . Bipolar disorder ([place])   . Cystitis, interstitial   . Depression   . Esophageal polyp    about 20 years ago  . GERD (gastroesophageal reflux disease)   . OCD (obsessive compulsive disorder)     OBJECTIVE:  Vitals:   10/31/18 1630  BP: (!) 128/91  Pulse: 80  Resp: 20  Temp: 98.1 F (36.7 C)  SpO2: 95%    General appearance: alert; no distress Eyes: PERRLA; EOMI; conjunctiva normal HENT: normocephalic; atraumatic Neck: supple; no carotid bruits Lungs: clear to auscultation bilaterally without adventitious breath sounds Heart: regular rate and rhythm.  Clear S1 and S2 without rubs, gallops, or murmur.  Radial pulses and posterior tibialis pulse 2+ and equal bilaterally Chest Wall: No heaves, lift, or thrills Abdomen: soft, non-tender; bowel sounds normal;  no guarding Extremities: no cyanosis or edema; symmetrical with no gross deformities Skin: warm and dry Psychological: alert and cooperative; anxious mood and affect  ECG:  Orders placed or performed during the hospital encounter of 07/07/18  . ED EKG  . ED EKG  . EKG 12-Lead  . EKG 12-Lead  . EKG   EKG normal sinus rhythm without ST elevations, depressions, or prolonged PR interval.  No narrowing or widening of the QRS complexes.  Comparable to past EKG.   ASSESSMENT & PLAN:  1. Chest pain, unspecified type   2. Weakness   3. Nausea without vomiting     Recommending further evaluation and management in the ED.  Cannot rule out cardiac cause or blood clot in urgent care setting.  Patient and husband aware.  Will go by private vehicle to [facility] ED.      Lestine Box, PA-C 10/31/18 1720

## 2018-10-31 NOTE — ED Triage Notes (Signed)
Pt having nausea with some chest discomfort

## 2018-10-31 NOTE — ED Notes (Signed)
After triage pt states she wants to go home, states she just needs to go lay down.  Discussed with pt risk of leaving including death.  Pt states she still wants to leave.  Pt husband picked her up outside of the ED

## 2018-11-03 ENCOUNTER — Other Ambulatory Visit: Payer: Self-pay

## 2018-11-03 ENCOUNTER — Ambulatory Visit (INDEPENDENT_AMBULATORY_CARE_PROVIDER_SITE_OTHER): Payer: Medicare Other | Admitting: Psychiatry

## 2018-11-03 DIAGNOSIS — F3160 Bipolar disorder, current episode mixed, unspecified: Secondary | ICD-10-CM

## 2018-11-03 NOTE — Progress Notes (Signed)
[facility] Counselor/Therapist Progress Note  Patient ID: Jennifer [name], MRN: [id number],    Date: 11/03/2018  Time Spent: 11:00am to 12:00noon  Treatment Type: Individual Therapy  Reported Symptoms:  Depression, anxiety, some tearfulness, family concerns  Mental Status Exam:  Appearance:   Casual     Behavior:  Appropriate and Sharing  Motor:  Normal  Speech/Language:   Normal Rate  Affect:  anxious, depressed, some tearfulness  Mood:  depressed, anxious  Thought process:  goal directed  Thought content:    WNL  Sensory/Perceptual disturbances:    WNL  Orientation:  oriented to person, place, time/date, situation, day of week, month of year and year  Attention:  Good  Concentration:  Good  Memory:  WNL  Fund of knowledge:   Good  Insight:    Fair  Judgment:   Fair  Impulse Control:  Fair   Risk Assessment: Danger to Self:  No Self-injurious Behavior: No Danger to Others: No Duty to Warn:no Physical Aggression / Violence:No  Access to Firearms a concern: No  Gang Involvement:No    Subjective: Patient in today feeling stressed, anxious, and depressed.  "Husband makes me feel like I'm crazy, He gaslights me and doesn't want me talking to other."  Marital and extended family concerns.  Interventions: Solution-Oriented/Positive Psychology and Ego-Supportive  Diagnosis:   ICD-10-CM   1. Mixed bipolar I disorder ([place])  F31.60     Plan: Treatment goals may remain the same for a while until met, however progress or lack of, will be noted each session in "Progress" sections.  Treatment Goals Patient is not signing tx goals on computer screen due COVID.  Long term goal: Develop the ability to recognize, accept, and cope with feelings of depression and anxiety successfully in positive and healthy ways.  Short term goal Learn and implement calming skills to reduce overall tension and moments of increased anxiety.  Strategies: 1)Practice calming  strategies that she has used in therapy office on occasions with benefit. Remain open to learning new ones. (breathing, 5 senses, counting, pause button) 2)Take her medications as prescribed. 3) Will learn and practice CBT strategies relating to changing her anxious and depressed thoughts to more hopeful and empowering thoughts. 4) Continue taking her medications as prescribed by her doctors.  PROGRESS: Patient reports having had a very stressful couple of weeks.  Home life seems to go "up and down, either ok or a blowout".  She also has been sick physically (reports not COVID). Seemed excited to share her homework and listed triggers: (did it a bit differently but validated efforts) 1.husband hollering at me and talking over me, 2. Mom snapping at me verbally "and jabs me" 3. My sister not seeming to care about me  4. I hurt in my heart and am angry at times 5. Nobody calls on me when I'm sick to see if I'm ok 6.husband telling me I'm twisting my thoughts around 7. Husband telling me I'm not remembering accurately 8. Husband having rules like "dont get new countertop wet, wipe them down", "dont scratch our new sink  Patient hard to focus on any of her concerns for very long. She was encouraged to address her concerns one by one and had a little success with this.  Was still very anxious but finally after talking at length, she was able to calm down some and talk about her hurt she feels so deeply today and not feeling understood by others.  Was not able  today to be as focused on her goals as she "needed to talk and be heard as I haven't been here in 3 weeks due to being sick."  Tried to engage her in some discussion about goals before session ended, specifically her thought patterns being quite anxious, negative, or depressive and how these thoughts lead to her feelings on anxiety and depression.  Agrees to a homework assignment involving monitoring/interrupting/replacing negative thoughts. (has  done some of this in prior session but not able to retain it.)  Will follow up next session.  Goal review and positive points shared with patient.     Next visit in 2 weeks.   Shanon Ace, LCSW

## 2018-11-04 ENCOUNTER — Telehealth: Payer: Self-pay | Admitting: Internal Medicine

## 2018-11-04 NOTE — Telephone Encounter (Addendum)
Pt said she was seen in ED recently for chest pain. She was discharged on Protonix bid and carafate tid and famotidine bid. She still has episodes of pain that start at her hiatal hernia and goes down to her mid lower abdomen. At this time she said she has taken her second pepcid and is feeling better.  I made her an appointment to come in on 11/08/2018 at 9:00 am and see Neil Crouch, PA.  I advised her to go back to the ED if pain gets severe, as we cannot treat her over the phone. She said she will do so. She said she has not had Covid, not been exposed to it as far as she knows and does not have any symptoms. She is aware to wear a mask and no one to come with her. Sending FYI to Dr. Gala Romney and Cyril Mourning.

## 2018-11-04 NOTE — Telephone Encounter (Signed)
Pt called to say she was seen in the ER and is still having extreme pain and "she can't take it anymore". She doesn't know if it's the medications she's taking or what the problem is. She was crying and acting hysterical on the phone. Please advise and call her at [phone]

## 2018-11-05 NOTE — Telephone Encounter (Signed)
Communication noted.  

## 2018-11-07 ENCOUNTER — Ambulatory Visit
Admission: EM | Admit: 2018-11-07 | Discharge: 2018-11-07 | Disposition: A | Discharge status: Home / Self Care | Payer: Medicare Other | Attending: Emergency Medicine | Admitting: Emergency Medicine

## 2018-11-07 ENCOUNTER — Other Ambulatory Visit: Payer: Self-pay

## 2018-11-07 DIAGNOSIS — H9203 Otalgia, bilateral: Secondary | ICD-10-CM | POA: Diagnosis not present

## 2018-11-07 DIAGNOSIS — J029 Acute pharyngitis, unspecified: Secondary | ICD-10-CM

## 2018-11-07 DIAGNOSIS — Z20828 Contact with and (suspected) exposure to other viral communicable diseases: Secondary | ICD-10-CM

## 2018-11-07 DIAGNOSIS — R05 Cough: Secondary | ICD-10-CM

## 2018-11-07 DIAGNOSIS — J209 Acute bronchitis, unspecified: Secondary | ICD-10-CM

## 2018-11-07 DIAGNOSIS — Z20822 Contact with and (suspected) exposure to covid-19: Secondary | ICD-10-CM

## 2018-11-07 MED ORDER — BENZONATATE 100 MG PO CAPS: 100.0000 mg | ORAL_CAPSULE | Freq: Three times a day (TID) | ORAL | 0 refills | Status: DC

## 2018-11-07 MED ORDER — ALBUTEROL SULFATE HFA 108 (90 BASE) MCG/ACT IN AERS: 1.0000 | INHALATION_SPRAY | Freq: Four times a day (QID) | RESPIRATORY_TRACT | 0 refills | Status: DC | PRN

## 2018-11-07 NOTE — ED Triage Notes (Signed)
Pt presents to UC w/ c/o sore throat, ear ache, slight cough since last night.

## 2018-11-07 NOTE — ED Provider Notes (Signed)
[place]   [id number] 11/07/18 Arrival Time: 1011   CC: URI symptoms; COVID test  SUBJECTIVE: History from: patient.  Jennifer [name] is a 53 y.o. female who presents with sore throat, ear ache, and slight dry cough that began last night.  Denies sick exposure to COVID, flu or strep.  Denies recent travel.  Has tried OTC medications with minimal relief.  Symptoms are made worse with exertion.  Denies fever, chills, SOB, chest pain, nausea, changes in bowel or bladder habits.    Does admit to tobacco use.    ROS: As per HPI.  All other pertinent ROS negative.     Past Medical History:  Diagnosis Date   Anxiety    Binge-eating and purging type anorexia nervosa    Bipolar disorder (HCC)    Cystitis, interstitial    Depression    Esophageal polyp    about 20 years ago   GERD (gastroesophageal reflux disease)    OCD (obsessive compulsive disorder)    Past Surgical History:  Procedure Laterality Date   BLADDER SURGERY     CHOLECYSTECTOMY     ESOPHAGOGASTRODUODENOSCOPY  2017   ESOPHAGOGASTRODUODENOSCOPY (EGD) WITH PROPOFOL N/A 10/17/2018   Procedure: ESOPHAGOGASTRODUODENOSCOPY (EGD) WITH PROPOFOL;  Surgeon: Daneil Dolin, MD;  Location: [facility];  Service: Endoscopy;  Laterality: N/A;  8:15am   VOCAL CORD LATERALIZATION, ENDOSCOPIC APPROACH W/ MLB     Allergies  Allergen Reactions   Codeine Hives and Shortness Of Breath   Sonata [Zaleplon] Other (See Comments)    Hallucinations   Meloxicam Other (See Comments)    Possible chest tightness - instructed by MD not to take   Other Nausea Only    CODIENE-unable to enter under "Agent" for some reason   Topamax [Topiramate] Other (See Comments)    Low BP and dizziness   Emgality [Galcanezumab-Gnlm] Rash   Penicillins Hives, Swelling and Rash    Has patient had a PCN reaction causing immediate rash, facial/tongue/throat swelling, SOB or lightheadedness with hypotension: yes Has patient had a  PCN reaction causing severe rash involving mucus membranes or skin necrosis: no Has patient had a PCN reaction that required hospitalization: no Has patient had a PCN reaction occurring within the last 10 years: no If all of the above answers are "NO", then may proceed with Cephalosporin use.;    No current facility-administered medications on file prior to encounter.    Current Outpatient Medications on File Prior to Encounter  Medication Sig Dispense Refill   acetaminophen (TYLENOL) 500 MG tablet Take 1,000 mg by mouth every 6 (six) hours as needed for moderate pain or headache.     clonazePAM (KLONOPIN) 0.5 MG tablet Take 1 tablet (0.5 mg total) by mouth 2 (two) times daily as needed for anxiety. 20 tablet 1   cycloSPORINE (RESTASIS) 0.05 % ophthalmic emulsion Place 1 drop into both eyes 2 (two) times daily.      dicyclomine (BENTYL) 20 MG tablet Take 1 tablet (20 mg total) by mouth 3 (three) times daily as needed for spasms (abdominal cramping). 20 tablet 0   divalproex (DEPAKOTE ER) 500 MG 24 hr tablet Take 1 tablet (500 mg total) by mouth daily. 30 tablet 5   famotidine (PEPCID) 20 MG tablet Take 1 tablet (20 mg total) by mouth 2 (two) times daily. 60 tablet 5   fluticasone (FLONASE) 50 MCG/ACT nasal spray Place 1 spray into both nostrils daily as needed for allergies.      fluvoxaMINE (LUVOX)  100 MG tablet Take 3 po qd (Patient taking differently: Take 300 mg by mouth daily. ) 270 tablet 1   gabapentin (NEURONTIN) 600 MG tablet Take 1 tablet (600 mg total) by mouth 4 (four) times daily. (Patient taking differently: Take 1,200 mg by mouth 2 (two) times daily. ) 120 tablet 5   hydrOXYzine (ATARAX/VISTARIL) 50 MG tablet 1-2 po q6hr prn (Patient taking differently: Take 50-100 mg by mouth every 6 (six) hours as needed for anxiety. ) 90 tablet 5   lurasidone (LATUDA) 40 MG TABS tablet Take 1 tablet (40 mg total) by mouth daily with breakfast. Must take with food. (Patient taking  differently: Take 40 mg by mouth daily after supper. ) 30 tablet 5   olopatadine (PATANOL) 0.1 % ophthalmic solution Place 1 drop into both eyes 2 (two) times daily.      ondansetron (ZOFRAN ODT) 4 MG disintegrating tablet Take 1 tablet (4 mg total) by mouth every 8 (eight) hours as needed for nausea or vomiting. 30 tablet 1   pantoprazole (PROTONIX) 40 MG tablet Take 1 tablet (40 mg total) by mouth 2 (two) times daily before a meal. 90 tablet 3   rizatriptan (MAXALT) 10 MG tablet Take 10 mg by mouth as needed for migraine. May repeat in 2 hours if needed     sucralfate (CARAFATE) 1 g tablet Take 1 tablet by mouth 2 (two) times a day.     traZODone (DESYREL) 100 MG tablet Take 1-2 tablets (100-200 mg total) by mouth at bedtime as needed for sleep. 60 tablet 5   valACYclovir (VALTREX) 500 MG tablet Take 500 mg by mouth daily as needed (outbreak).      Social History   Socioeconomic History   Marital status: Married    Spouse name: Not on file   Number of children: Not on file   Years of education: Not on file   Highest education level: Not on file  Occupational History   Not on file  Social Needs   Financial resource strain: Not on file   Food insecurity    Worry: Not on file    Inability: Not on file   Transportation needs    Medical: Not on file    Non-medical: Not on file  Tobacco Use   Smoking status: Current Every Day Smoker    Packs/day: 0.50    Years: 20.00    Pack years: 10.00    Types: Cigarettes    Start date: 01/20/1988   Smokeless tobacco: Never Used  Substance and Sexual Activity   Alcohol use: No    Alcohol/week: 0.0 standard drinks   Drug use: No   Sexual activity: Yes    Birth control/protection: None  Lifestyle   Physical activity    Days per week: Not on file    Minutes per session: Not on file   Stress: Not on file  Relationships   Social connections    Talks on phone: Not on file    Gets together: Not on file    Attends  religious service: Not on file    Active member of club or organization: Not on file    Attends meetings of clubs or organizations: Not on file    Relationship status: Not on file   Intimate partner violence    Fear of current or ex partner: Not on file    Emotionally abused: Not on file    Physically abused: Not on file    Forced sexual activity: Not  on file  Other Topics Concern   Not on file  Social History Narrative   Still having marital discord.  Last night she had a conversation with her husband and told him she was planning to leave him.  He got upset and said he would do anything to with him.  He has called their pastor and set up a counseling session for Sunday.  She states "I do not know if I will even go.  I feel like I have been trying for a long time and he has not been trying at all"   Family History  Problem Relation Age of Onset   Healthy Mother    Healthy Father    Colon cancer Neg Hx    Colon polyps Neg Hx     OBJECTIVE:  Vitals:   11/07/18 1021  BP: 111/73  Pulse: 75  Resp: 16  Temp: 98.8 F (37.1 C)  TempSrc: Oral  SpO2: 99%     General appearance: alert; appears mildly fatigued, but nontoxic; speaking in full sentences and tolerating own secretions HEENT: NCAT; Ears: EACs clear, TMs pearly gray; Eyes: PERRL.  EOM grossly intact. Nose: nares patent without rhinorrhea, Throat: oropharynx clear, tonsils non erythematous or enlarged, uvula midline  Neck: supple without LAD Lungs: unlabored respirations, symmetrical air entry; cough: moderate; no respiratory distress; scattered wheezes throughout bilateral lung fields Heart: regular rate and rhythm.   Skin: warm and dry Psychological: alert and cooperative; normal mood and affect  ASSESSMENT & PLAN:  1. Suspected COVID-19 virus infection   2. Acute bronchitis, unspecified organism     Meds ordered this encounter  Medications   benzonatate (TESSALON) 100 MG capsule    Sig: Take 1 capsule (100  mg total) by mouth every 8 (eight) hours.    Dispense:  21 capsule    Refill:  0    Order Specific Question:   Supervising Provider    Answer:   Raylene Everts [id number]   albuterol (VENTOLIN HFA) 108 (90 Base) MCG/ACT inhaler    Sig: Inhale 1-2 puffs into the lungs every 6 (six) hours as needed for wheezing or shortness of breath.    Dispense:  18 g    Refill:  0    Order Specific Question:   Supervising Provider    Answer:   Raylene Everts [id number]    COVID testing ordered.  It will take between 5-7 days for test results.  Someone will contact you regarding abnormal results.    In the meantime: You should remain isolated in your home for 10 days from symptom onset AND greater than 72 hours after symptoms resolution (absence of fever without the use of fever-reducing medication and improvement in respiratory symptoms), whichever is longer Get plenty of rest and push fluids Tessalon Perles prescribed for cough You may use zyrtec-D as needed for nasal congestion, runny nose, and/or sore throat You may use flonase as needed for nasal congestion and runny nose Use medications daily for symptom relief Use OTC medications like ibuprofen or tylenol as needed fever or pain Call or go to the ED if you have any new or worsening symptoms such as fever, worsening cough, shortness of breath, chest tightness, chest pain, turning blue, changes in mental status, etc...   Albuterol inhaler also sent in.  Instructed patient to use as needed for wheezing and/or shortness of breath  Reviewed expectations re: course of current medical issues. Questions answered. Outlined signs and symptoms indicating need for more acute  intervention. Patient verbalized understanding. After Visit Summary given.         Lestine Box, PA-C 11/07/18 1624

## 2018-11-07 NOTE — Discharge Instructions (Signed)
COVID testing ordered.  It will take between 5-7 days for test results.  Someone will contact you regarding abnormal results.    In the meantime: You should remain isolated in your home for 10 days from symptom onset AND greater than 72 hours after symptoms resolution (absence of fever without the use of fever-reducing medication and improvement in respiratory symptoms), whichever is longer Get plenty of rest and push fluids Tessalon Perles prescribed for cough You may use zyrtec-D as needed for nasal congestion, runny nose, and/or sore throat You may use flonase as neededfor nasal congestion and runny nose Use medications daily for symptom relief Use OTC medications like ibuprofen or tylenol as needed fever or pain Call or go to the ED if you have any new or worsening symptoms such as fever, worsening cough, shortness of breath, chest tightness, chest pain, turning blue, changes in mental status, etc..Marland Kitchen

## 2018-11-08 ENCOUNTER — Encounter: Payer: Self-pay | Admitting: Gastroenterology

## 2018-11-08 ENCOUNTER — Encounter: Payer: Self-pay | Admitting: Internal Medicine

## 2018-11-08 ENCOUNTER — Ambulatory Visit (INDEPENDENT_AMBULATORY_CARE_PROVIDER_SITE_OTHER): Payer: Medicare Other | Admitting: Gastroenterology

## 2018-11-08 DIAGNOSIS — K2100 Gastro-esophageal reflux disease with esophagitis, without bleeding: Secondary | ICD-10-CM | POA: Diagnosis not present

## 2018-11-08 DIAGNOSIS — F502 Bulimia nervosa: Secondary | ICD-10-CM

## 2018-11-08 DIAGNOSIS — R1013 Epigastric pain: Secondary | ICD-10-CM

## 2018-11-08 LAB — NOVEL CORONAVIRUS, NAA: SARS-CoV-2, NAA: NOT DETECTED

## 2018-11-08 NOTE — Progress Notes (Signed)
Primary Care Physician:  Maryruth Hancock, MD Primary GI:  Garfield Cornea, MD   Patient Location: Home  Provider Location: [facility] office  Reason for Visit: abdominal pain  Persons present on the virtual encounter, with roles: Patient, myself (provider), Zara Council, LPN(updated meds and allergies)  Total time (minutes) spent on medical discussion: 30 minutes  Due to COVID-19, visit was conducted using [email or web address] method.  Visit was requested by patient.  Virtual Visit via [email or web address]  I connected with [name] on 11/08/18 at  9:00 AM EDT by [email or web address] and verified that I am speaking with the correct person using two identifiers.   I discussed the limitations, risks, security and privacy concerns of performing an evaluation and management service by telephone/video and the availability of in person appointments. I also discussed with the patient that there may be a patient responsible charge related to this service. The patient expressed understanding and agreed to proceed.  Chief Complaint  Patient presents with   Abdominal Pain    doing better; hurts area of hiatal hernia when occurs    HPI:   Jennifer [name] is a 53 y.o. female who presents for virtual visit regarding abdominal pain.  Patient's has medical history significant for anxiety, binge eating/purging, OCD, bipolar disorder, interstitial cystitis who presents for follow-up.  She was seen in the office back in September as a new patient.  Previously seen by [facility]. History of abdominal pain and/or chest pain with multiple ED visits in the past 4 months.  Previously noting increased symptoms of severe epigastric pain after purging.  Associated nausea.  Chronically on PPI therapy.  At time of last office visit her PPI was switched from omeprazole to pantoprazole and she was advised to take 40 mg twice daily.  She was also on Pepcid 20 mg as needed.  After couple of weeks of therapy her symptoms are much improved.  She had a EGD  that showed mild reflux esophagitis and small hiatal hernia.  CT abdomen pelvis with contrast in June showed moderately large hiatal hernia but otherwise no findings to explain acute abdominal pain.  She has a 4 cm fibroid in the anterior fundus of the uterus.  Labs in June, September, October with normal LFTs, lipase, CBC.  Since her endoscopy she has had about 4 episodes of recurrent epigastric pain.  Describes pain as sharp/stabbing, causes lightheadedness and nausea.  Excruciating at times.  Has been to the ED several times.  Take the Pepcid right when symptoms began, and/or Mylanta.  When severe symptoms she takes trazodone to try to fall asleep.  Generally when she wakes up her symptoms are improved.  Pain usually starts in the epigastrium and radiates downward into the stomach.  Always happens after meals.  In the past has been worse with purging.  States she has not induced vomiting in over 45 days at this point.  Continues to see psychiatry about every other week.  Typical reflux seems to be better controlled.  Last episode of abdominal pain was yesterday after drinking hot decaf tea trying to break up her congestion.  Was seen in urgent care yesterday and tested for Covid, results are pending.  She has intermittent diarrhea and constipation.  Can have significant diarrhea several days in a row and then constipation for 2 days.  Takes bentyl. History of fissures which flare at times.  Currently doing okay.  Has history of IBS.  Feels like her upper GI symptoms  may be related to food.  Plans to keep a food journal.  She wants to have a colonoscopy, was never able to complete colonoscopy through [facility].  States that she was having to severe upper abdominal pain after starting a clear liquid diet, never got to the [place].  Procedure had to be canceled.  Continues to smoke, has cut down to 1/2 pack/day.  Takes pantoprazole 40 mg before breakfast and evening meal.  Has been taking Carafate and  Pepcid at lunch.  Takes Pepcid as needed for abdominal pain.  Also takes Carafate at dinner.      Current Outpatient Medications  Medication Sig Dispense Refill   acetaminophen (TYLENOL) 500 MG tablet Take 1,000 mg by mouth every 6 (six) hours as needed for moderate pain or headache.     albuterol (VENTOLIN HFA) 108 (90 Base) MCG/ACT inhaler Inhale 1-2 puffs into the lungs every 6 (six) hours as needed for wheezing or shortness of breath. 18 g 0   benzonatate (TESSALON) 100 MG capsule Take 1 capsule (100 mg total) by mouth every 8 (eight) hours. 21 capsule 0   clonazePAM (KLONOPIN) 0.5 MG tablet Take 1 tablet (0.5 mg total) by mouth 2 (two) times daily as needed for anxiety. 20 tablet 1   cycloSPORINE (RESTASIS) 0.05 % ophthalmic emulsion Place 1 drop into both eyes 2 (two) times daily.      dicyclomine (BENTYL) 20 MG tablet Take 1 tablet (20 mg total) by mouth 3 (three) times daily as needed for spasms (abdominal cramping). 20 tablet 0   divalproex (DEPAKOTE ER) 500 MG 24 hr tablet Take 1 tablet (500 mg total) by mouth daily. 30 tablet 5   famotidine (PEPCID) 20 MG tablet Take 1 tablet (20 mg total) by mouth 2 (two) times daily. 60 tablet 5   fluticasone (FLONASE) 50 MCG/ACT nasal spray Place 1 spray into both nostrils daily as needed for allergies.      fluvoxaMINE (LUVOX) 100 MG tablet Take 3 po qd (Patient taking differently: Take 300 mg by mouth daily. ) 270 tablet 1   gabapentin (NEURONTIN) 600 MG tablet Take 1 tablet (600 mg total) by mouth 4 (four) times daily. (Patient taking differently: Take 1,200 mg by mouth 2 (two) times daily. ) 120 tablet 5   hydrOXYzine (ATARAX/VISTARIL) 50 MG tablet 1-2 po q6hr prn (Patient taking differently: Take 50-100 mg by mouth every 6 (six) hours as needed for anxiety. ) 90 tablet 5   lurasidone (LATUDA) 40 MG TABS tablet Take 1 tablet (40 mg total) by mouth daily with breakfast. Must take with food. (Patient taking differently: Take 40 mg by  mouth daily after supper. ) 30 tablet 5   olopatadine (PATANOL) 0.1 % ophthalmic solution Place 1 drop into both eyes 2 (two) times daily.      ondansetron (ZOFRAN ODT) 4 MG disintegrating tablet Take 1 tablet (4 mg total) by mouth every 8 (eight) hours as needed for nausea or vomiting. 30 tablet 1   pantoprazole (PROTONIX) 40 MG tablet Take 1 tablet (40 mg total) by mouth 2 (two) times daily before a meal. 90 tablet 3   rizatriptan (MAXALT) 10 MG tablet Take 10 mg by mouth as needed for migraine. May repeat in 2 hours if needed     sucralfate (CARAFATE) 1 g tablet Take 1 tablet by mouth 2 (two) times a day.     traZODone (DESYREL) 100 MG tablet Take 1-2 tablets (100-200 mg total) by mouth at bedtime as needed  for sleep. 60 tablet 5   valACYclovir (VALTREX) 500 MG tablet Take 500 mg by mouth daily as needed (outbreak).      No current facility-administered medications for this visit.     Past Medical History:  Diagnosis Date   Anxiety    Binge-eating and purging type anorexia nervosa    Bipolar disorder (HCC)    Cystitis, interstitial    Depression    Esophageal polyp    about 20 years ago   GERD (gastroesophageal reflux disease)    OCD (obsessive compulsive disorder)     Past Surgical History:  Procedure Laterality Date   BLADDER SURGERY     CHOLECYSTECTOMY     ESOPHAGOGASTRODUODENOSCOPY  2017   ESOPHAGOGASTRODUODENOSCOPY (EGD) WITH PROPOFOL N/A 10/17/2018   Dr. Gala Romney: mild reflux esophagitis, small hiatal hernia   VOCAL CORD LATERALIZATION, ENDOSCOPIC APPROACH W/ MLB      Family History  Problem Relation Age of Onset   Healthy Mother    Healthy Father    Colon cancer Neg Hx    Colon polyps Neg Hx     Social History   Socioeconomic History   Marital status: Married    Spouse name: Not on file   Number of children: Not on file   Years of education: Not on file   Highest education level: Not on file  Occupational History   Not on file    Social Needs   Financial resource strain: Not on file   Food insecurity    Worry: Not on file    Inability: Not on file   Transportation needs    Medical: Not on file    Non-medical: Not on file  Tobacco Use   Smoking status: Current Every Day Smoker    Packs/day: 0.50    Years: 20.00    Pack years: 10.00    Types: Cigarettes    Start date: 01/20/1988   Smokeless tobacco: Never Used  Substance and Sexual Activity   Alcohol use: No    Alcohol/week: 0.0 standard drinks   Drug use: No   Sexual activity: Yes    Birth control/protection: None  Lifestyle   Physical activity    Days per week: Not on file    Minutes per session: Not on file   Stress: Not on file  Relationships   Social connections    Talks on phone: Not on file    Gets together: Not on file    Attends religious service: Not on file    Active member of club or organization: Not on file    Attends meetings of clubs or organizations: Not on file    Relationship status: Not on file   Intimate partner violence    Fear of current or ex partner: Not on file    Emotionally abused: Not on file    Physically abused: Not on file    Forced sexual activity: Not on file  Other Topics Concern   Not on file  Social History Narrative   Still having marital discord.  Last night she had a conversation with her husband and told him she was planning to leave him.  He got upset and said he would do anything to with him.  He has called their pastor and set up a counseling session for Sunday.  She states "I do not know if I will even go.  I feel like I have been trying for a long time and he has not been trying at all"  ROS:  General: Negative for weight loss, fever, chills, fatigue, weakness. H/O bulimia. Eyes: Negative for vision changes.  ENT: Negative for hoarseness, difficulty swallowing , nasal congestion. CV: Negative for chest pain, angina, palpitations, dyspnea on exertion, peripheral edema.   Respiratory: Negative for dyspnea at rest, dyspnea on exertion, cough, sputum, wheezing.  GI: See history of present illness. GU:  Negative for dysuria, hematuria, urinary incontinence, urinary frequency, nocturnal urination.  MS: Negative for joint pain, low back pain.  Derm: Negative for rash or itching.  Neuro: Negative for weakness, abnormal sensation, seizure, frequent headaches, memory loss, confusion.  Psych: Negative for anxiety, depression, suicidal ideation, hallucinations.  Endo: Negative for unusual weight change.  Heme: Negative for bruising or bleeding. Allergy: Negative for rash or hives.   Observations/Objective: Pleasant moderately obese female lying in bed.  No acute distress. Talks easily without evidence of SOB. Good skin color. Points to epigastric area as site of pain. Otherwise exam not available.   Assessment and Plan: 53 year old female with history of hiatal hernia (on CT imaging described as moderately large but at time of EGD appeared small).  She has had intermittent episodes of epigastric pain which are worse with meals as well as when she was intentionally purging.  Fortunately she has been 45 days without purging at this point.  She has had about 4 episodes of epigastric pain since her endoscopy.  Symptoms often will radiate into the chest.  She had reflux esophagitis on EGD as well.  Her omeprazole was switched to pantoprazole 40 mg twice daily.  Carafate seems to help as well.  She believes her symptoms may be related to certain foods that she may be eating.  She intends to keep a food journal and turn this in after she has had a few episodes of abdominal pain.  She will continue pantoprazole 40 mg before breakfast and evening meal.  She will take Pepcid up to twice daily.  Continue Carafate as needed.  If symptoms continue to be a problem, may consider barium test to better measure her hiatal hernia.  Once her symptoms settle down we will work towards a  colonoscopy.  Plan to see her back in 3 months to reassess.   Follow Up Instructions:    I discussed the assessment and treatment plan with the patient. The patient was provided an opportunity to ask questions and all were answered. The patient agreed with the plan and demonstrated an understanding of the instructions. AVS mailed to patient's home address.   The patient was advised to call back or seek an in-person evaluation if the symptoms worsen or if the condition fails to improve as anticipated.  I provided 30 minutes of virtual face-to-face time during this encounter.   Neil Crouch, PA-C

## 2018-11-08 NOTE — Patient Instructions (Addendum)
1. Keep food journal. Turn in to me after you have had at least 2-3 episodes of abdominal pain so we can try to figure out what is causing your symptoms.  2. Congratulations on your 45 days of no purging! Keep up the good work! 3. Continue Protonix 40mg  30 minutes before breakfast and 30 minutes before your evening meal.  4. You can take Pepcid up to twice daily. You may consider taking one when you have pain. And take one at bedtime. 5. You can use Carafate as needed. This should be consider for short term use or only only takes you have symptoms.  6. If you continue to have symptoms, we will have you complete barium test to better measure your hiatal hernia. 7. Once we get your symptoms to settle down, we will work towards a colonoscopy. 8. We will plan to see you back in 3 months.    Food Choices for Gastroesophageal Reflux Disease, Adult When you have gastroesophageal reflux disease (GERD), the foods you eat and your eating habits are very important. Choosing the right foods can help ease the discomfort of GERD. Consider working with a diet and nutrition specialist (dietitian) to help you make healthy food choices. What general guidelines should I follow?  Eating plan  Choose healthy foods low in fat, such as fruits, vegetables, whole grains, low-fat dairy products, and lean meat, fish, and poultry.  Eat frequent, small meals instead of three large meals each day. Eat your meals slowly, in a relaxed setting. Avoid bending over or lying down until 2-3 hours after eating.  Limit high-fat foods such as fatty meats or fried foods.  Limit your intake of oils, butter, and shortening to less than 8 teaspoons each day.  Avoid the following: ? Foods that cause symptoms. These may be different for different people. Keep a food diary to keep track of foods that cause symptoms. ? Alcohol. ? Drinking large amounts of liquid with meals. ? Eating meals during the 2-3 hours before bed.  Cook foods  using methods other than frying. This may include baking, grilling, or broiling. Lifestyle  Maintain a healthy weight. Ask your health care provider what weight is healthy for you. If you need to lose weight, work with your health care provider to do so safely.  Exercise for at least 30 minutes on 5 or more days each week, or as told by your health care provider.  Avoid wearing clothes that fit tightly around your waist and chest.  Do not use any products that contain nicotine or tobacco, such as cigarettes and e-cigarettes. If you need help quitting, ask your health care provider.  Sleep with the head of your bed raised. Use a wedge under the mattress or blocks under the bed frame to raise the head of the bed. What foods are not recommended? The items listed may not be a complete list. Talk with your dietitian about what dietary choices are best for you. Grains Pastries or quick breads with added fat. [place] toast. Vegetables Deep fried vegetables. [place] fries. Any vegetables prepared with added fat. Any vegetables that cause symptoms. For some people this may include tomatoes and tomato products, chili peppers, onions and garlic, and horseradish. Fruits Any fruits prepared with added fat. Any fruits that cause symptoms. For some people this may include citrus fruits, such as oranges, grapefruit, pineapple, and lemons. Meats and other protein foods High-fat meats, such as fatty beef or pork, hot dogs, ribs, ham, sausage, salami and bacon.  Fried meat or protein, including fried fish and fried chicken. Nuts and nut butters. Dairy Whole milk and chocolate milk. Sour cream. Cream. Ice cream. Cream cheese. Milk shakes. Beverages Coffee and tea, with or without caffeine. Carbonated beverages. Sodas. Energy drinks. Fruit juice made with acidic fruits (such as orange or grapefruit). Tomato juice. Alcoholic drinks. Fats and oils Butter. Margarine. Shortening. Ghee. Sweets and desserts Chocolate  and cocoa. Donuts. Seasoning and other foods Pepper. Peppermint and spearmint. Any condiments, herbs, or seasonings that cause symptoms. For some people, this may include curry, hot sauce, or vinegar-based salad dressings. Summary  When you have gastroesophageal reflux disease (GERD), food and lifestyle choices are very important to help ease the discomfort of GERD.  Eat frequent, small meals instead of three large meals each day. Eat your meals slowly, in a relaxed setting. Avoid bending over or lying down until 2-3 hours after eating.  Limit high-fat foods such as fatty meat or fried foods. This information is not intended to replace advice given to you by your health care provider. Make sure you discuss any questions you have with your health care provider. Document Released: 01/05/2005 Document Revised: 04/28/2018 Document Reviewed: 01/07/2016 Elsevier Patient Education  [place].  Hiatal Hernia  A hiatal hernia occurs when part of the stomach slides above the muscle that separates the abdomen from the chest (diaphragm). A person can be born with a hiatal hernia (congenital), or it may develop over time. In almost all cases of hiatal hernia, only the top part of the stomach pushes through the diaphragm. Many people have a hiatal hernia with no symptoms. The larger the hernia, the more likely it is that you will have symptoms. In some cases, a hiatal hernia allows stomach acid to flow back into the tube that carries food from your mouth to your stomach (esophagus). This may cause heartburn symptoms. Severe heartburn symptoms may mean that you have developed a condition called gastroesophageal reflux disease (GERD). What are the causes? This condition is caused by a weakness in the opening (hiatus) where the esophagus passes through the diaphragm to attach to the upper part of the stomach. A person may be born with a weakness in the hiatus, or a weakness can develop over time. What  increases the risk? This condition is more likely to develop in:  Older people. Age is a major risk factor for a hiatal hernia, especially if you are over the age of 99.  Pregnant women.  People who are overweight.  People who have frequent constipation. What are the signs or symptoms? Symptoms of this condition usually develop in the form of GERD symptoms. Symptoms include:  Heartburn.  Belching.  Indigestion.  Trouble swallowing.  Coughing or wheezing.  Sore throat.  Hoarseness.  Chest pain.  Nausea and vomiting. How is this diagnosed? This condition may be diagnosed during testing for GERD. Tests that may be done include:  X-rays of your stomach or chest.  An upper gastrointestinal (GI) series. This is an X-ray exam of your GI tract that is taken after you swallow a chalky liquid that shows up clearly on the X-ray.  Endoscopy. This is a procedure to look into your stomach using a thin, flexible tube that has a tiny camera and light on the end of it. How is this treated? This condition may be treated by:  Dietary and lifestyle changes to help reduce GERD symptoms.  Medicines. These may include: ? Over-the-counter antacids. ? Medicines that make your  stomach empty more quickly. ? Medicines that block the production of stomach acid (H2 blockers). ? Stronger medicines to reduce stomach acid (proton pump inhibitors).  Surgery to repair the hernia, if other treatments are not helping. If you have no symptoms, you may not need treatment. Follow these instructions at home: Lifestyle and activity  Do not use any products that contain nicotine or tobacco, such as cigarettes and e-cigarettes. If you need help quitting, ask your health care provider.  Try to achieve and maintain a healthy body weight.  Avoid putting pressure on your abdomen. Anything that puts pressure on your abdomen increases the amount of acid that may be pushed up into your esophagus. ? Avoid  bending over, especially after eating. ? Raise the head of your bed by putting blocks under the legs. This keeps your head and esophagus higher than your stomach. ? Do not wear tight clothing around your chest or stomach. ? Try not to strain when having a bowel movement, when urinating, or when lifting heavy objects. Eating and drinking  Avoid foods that can worsen GERD symptoms. These may include: ? Fatty foods, like fried foods. ? Citrus fruits, like oranges or lemon. ? Other foods and drinks that contain acid, like orange juice or tomatoes. ? Spicy food. ? Chocolate.  Eat frequent small meals instead of three large meals a day. This helps prevent your stomach from getting too full. ? Eat slowly. ? Do not lie down right after eating. ? Do not eat 1-2 hours before bed.  Do not drink beverages with caffeine. These include cola, coffee, cocoa, and tea.  Do not drink alcohol. General instructions  Take over-the-counter and prescription medicines only as told by your health care provider.  Keep all follow-up visits as told by your health care provider. This is important. Contact a health care provider if:  Your symptoms are not controlled with medicines or lifestyle changes.  You are having trouble swallowing.  You have coughing or wheezing that will not go away. Get help right away if:  Your pain is getting worse.  Your pain spreads to your arms, neck, jaw, teeth, or back.  You have shortness of breath.  You sweat for no reason.  You feel sick to your stomach (nauseous) or you vomit.  You vomit blood.  You have bright red blood in your stools.  You have black, tarry stools. This information is not intended to replace advice given to you by your health care provider. Make sure you discuss any questions you have with your health care provider. Document Released: 03/28/2003 Document Revised: 12/18/2016 Document Reviewed: 08/10/2016 Elsevier Patient Education  2020  [employer].

## 2018-11-10 ENCOUNTER — Ambulatory Visit: Payer: Medicare Other | Admitting: Psychiatry

## 2018-11-10 ENCOUNTER — Telehealth: Payer: Self-pay | Admitting: Physician Assistant

## 2018-11-10 NOTE — Telephone Encounter (Signed)
Patient called crying stating she cannot wait for her appointment and she needs to see someone 10/27 and she says she had to fight for her life she hasn't purged in 45 days all on her own and she's not going to the hospital her anxiety is off the charts, she cannot sleep all the time but doesn't want to be a zombie

## 2018-11-11 NOTE — Telephone Encounter (Signed)
I spoke with Jennifer [name] and she was crying. She does not have any SI/HI going. Her anxiety is through the roof and she does not know what to do. She does not want to go to the emergency room unless she has no there choice. She does not want to increase the Gabapentin. She feels like she is on to much already. Her brain feels foggy on it. She feels as though she is over-medicated. With her anxiety being so bad she had to take a Klonopin today which she states she never usually has to. I looked in her chart and reminded her that she can take this twice a day for breakthrough anxiety if needed. She said the one she took today helped some and she laid down for a little while. She will not increase the Gabapentin at this time so she stated she will just use her PRN  Klonopin to get her through until her appt. With you on Tuesday. Is there anything else she can do until then?

## 2018-11-11 NOTE — Telephone Encounter (Signed)
If SI, go to ER.  I don't have any openings today.  Not sure about Monday and her appt is Tuesday.  She'll have to go to ER if needs emergent care.   If no SI, increase Gabapentin.  She's on 600 mg, 2 bid.  So increase by adding 300 mg around lunch. She can cut the tab in 1/2 for the afternoon dose.

## 2018-11-14 ENCOUNTER — Telehealth: Payer: Self-pay | Admitting: Neurology

## 2018-11-14 MED ORDER — SUMATRIPTAN SUCCINATE 50 MG PO TABS: ORAL_TABLET | ORAL | 0 refills | Status: DC

## 2018-11-14 NOTE — Telephone Encounter (Signed)
Since she doesn't want to make any med changes, there's nothing I can do.  Only refer to ER if SI/HI.  I'll discuss w/ her at appt.  No need to call her back.

## 2018-11-14 NOTE — Telephone Encounter (Signed)
Reviewed

## 2018-11-14 NOTE — Telephone Encounter (Signed)
Called the patient back and advised that per our records and maxalt wasn't working effectively. They patient is open to a new medication sumatriptan being called in. She also states she would like to continue the depakote because it is helping her. We will leave that medication as it. Pt verbalized understanding. Sumatriptan sent for the patient to the pharmacy.

## 2018-11-14 NOTE — Telephone Encounter (Signed)
Pt is wanting to know if she can get a script of Maxalt sent to [facility] [place], [place]

## 2018-11-14 NOTE — Telephone Encounter (Signed)
VPA- also known as depakote , can cause weight gain, but a once a day dose at bedtime is usually less likely to cause weight issues.  Maxalt was used for prn emergency only. I don't want to refill what reportedly hasn't worked.

## 2018-11-14 NOTE — Telephone Encounter (Signed)
Pt called wanting to inform provider that her divalproex (DEPAKOTE ER) 500 MG 24 hr tablet is making her eat to much and she is wanting to know if there is something else that she can take because she does not want to gain anymore weight. Please advise.

## 2018-11-15 ENCOUNTER — Ambulatory Visit: Payer: Medicare Other | Admitting: Family Medicine

## 2018-11-15 ENCOUNTER — Other Ambulatory Visit: Payer: Self-pay

## 2018-11-15 ENCOUNTER — Encounter: Payer: Self-pay | Admitting: Family Medicine

## 2018-11-15 ENCOUNTER — Ambulatory Visit: Payer: Medicare Other | Admitting: Physician Assistant

## 2018-11-15 VITALS — BP 120/83 | HR 75 | Temp 98.8°F | Ht 64.0 in | Wt 174.2 lb

## 2018-11-15 DIAGNOSIS — R0789 Other chest pain: Secondary | ICD-10-CM

## 2018-11-15 DIAGNOSIS — J329 Chronic sinusitis, unspecified: Secondary | ICD-10-CM | POA: Diagnosis not present

## 2018-11-15 NOTE — Patient Instructions (Addendum)
Mucinex-DM Drink plenty of water  Potassium Content of Foods  Potassium is a mineral found in many foods and drinks. It affects how the heart works, and helps keep fluids and minerals balanced in the body. The amount of potassium you need each day depends on your age and any medical conditions you may have. Talk to your health care provider or dietitian about how much potassium you need. The following lists of foods provide the general serving size for foods and the approximate amount of potassium in each serving, listed in milligrams (mg). Actual values may vary depending on the product and how it is processed. High in potassium The following foods and beverages have 200 mg or more of potassium per serving:  Apricots (raw) - 2 have 200 mg of potassium.  Apricots (dry) - 5 have 200 mg of potassium.  Artichoke - 1 medium has 345 mg of potassium.  Avocado -  fruit has 245 mg of potassium.  Banana - 1 medium fruit has 425 mg of potassium.  Enders or baked beans (canned) -  cup has 280 mg of potassium.  White beans (canned) -  cup has 595 mg potassium.  Beef roast - 3 oz has 320 mg of potassium.  Ground beef - 3 oz has 270 mg of potassium.  Beets (raw or cooked) -  cup has 260 mg of potassium.  Bran muffin - 2 oz has 300 mg of potassium.  Broccoli (cooked) -  cup has 230 mg of potassium.  Brussels sprouts -  cup has 250 mg of potassium.  Cantaloupe -  cup has 215 mg of potassium.  Cereal, 100% bran -  cup has 200-400 mg of potassium.  Cheeseburger -1 single fast food burger has 225-400 mg of potassium.  Chicken - 3 oz has 220 mg of potassium.  Clams (canned) - 3 oz has 535 mg of potassium.  Crab - 3 oz has 225 mg of potassium.  Dates - 5 have 270 mg of potassium.  Dried beans and peas -  cup has 300-475 mg of potassium.  Figs (dried) - 2 have 260 mg of potassium.  Fish (halibut, tuna, cod, snapper) - 3 oz has 480 mg of potassium.  Fish (salmon, haddock,  swordfish, perch) - 3 oz has 300 mg of potassium.  Fish (tuna, canned) - 3 oz has 200 mg of potassium.  [place] fries (fast food) - 3 oz has 470 mg of potassium.  Granola with fruit and nuts -  cup has 200 mg of potassium.  Grapefruit juice -  cup has 200 mg of potassium.  Honeydew melon -  cup has 200 mg of potassium.  Kale (raw) - 1 cup has 300 mg of potassium.  Kiwi - 1 medium fruit has 240 mg of potassium.  Kohlrabi, rutabaga, parsnips -  cup has 280 mg of potassium.  Lentils -  cup has 365 mg of potassium.  Mango - 1 each has 325 mg of potassium.  Milk (nonfat, low-fat, whole, buttermilk) - 1 cup has 350-380 mg of potassium.  Milk (chocolate) - 1 cup has 420 mg of potassium  Molasses - 1 Tbsp has 295 mg of potassium.  Mushrooms -  cup has 280 mg of potassium.  Nectarine - 1 each has 275 mg of potassium.  Nuts (almonds, peanuts, hazelnuts, [place], cashew, mixed) - 1 oz has 200 mg of potassium.  Nuts (pistachios) - 1 oz has 295 mg of potassium.  Orange - 1 fruit has 240 mg  of potassium.  Orange juice -  cup has 235 mg of potassium.  Papaya -  medium fruit has 390 mg of potassium.  Peanut butter (chunky) - 2 Tbsp has 240 mg of potassium.  Peanut butter (smooth) - 2 Tbsp has 210 mg of potassium.  Pear - 1 medium (200 mg) of potassium.  Pomegranate - 1 whole fruit has 400 mg of potassium.  Pomegranate juice -  cup has 215 mg of potassium.  Pork - 3 oz has 350 mg of potassium.  Potato chips (salted) - 1 oz has 465 mg of potassium.  Potato (baked with skin) - 1 medium has 925 mg of potassium.  Potato (boiled) -  cup has 255 mg of potassium.  Potato (Mashed) -  cup has 330 mg of potassium.  Prune juice -  cup has 370 mg of potassium.  Prunes - 5 have 305 mg of potassium.  Pudding (chocolate) -  cup has 230 mg of potassium.  Pumpkin (canned) -  cup has 250 mg of potassium.  Raisins (seedless) -  cup has 270 mg of potassium.  Seeds  (sunflower or pumpkin) - 1 oz has 240 mg of potassium.  Soy milk - 1 cup has 300 mg of potassium.  Spinach (cooked) - 1/2 cup has 420 mg of potassium.  Spinach (canned) -  cup has 370 mg of potassium.  Sweet potato (baked with skin) - 1 medium has 450 mg of potassium.  Swiss chard -  cup has 480 mg of potassium.  Tomato or vegetable juice -  cup has 275 mg of potassium.  Tomato (sauce or puree) -  cup has 400-550 mg of potassium.  Tomato (raw) - 1 medium has 290 mg of potassium.  Tomato (canned) -  cup has 200-300 mg of potassium.  [place] - 3 oz has 250 mg of potassium.  Wheat germ - 1 oz has 250 mg of potassium.  Winter squash -  cup has 250 mg of potassium.  Yogurt (plain or fruited) - 6 oz has 260-435 mg of potassium.  Zucchini -  cup has 220 mg of potassium. Moderate in potassium The following foods and beverages have 50-200 mg of potassium per serving:  Apple - 1 fruit has 150 mg of potassium  Apple juice -  cup has 150 mg of potassium  Applesauce -  cup has 90 mg of potassium  Apricot nectar -  cup has 140 mg of potassium  Asparagus (small spears) -  cup has 155 mg of potassium  Asparagus (large spears) - 6 have 155 mg of potassium  Bagel (cinnamon raisin) - 1 four-inch bagel has 130 mg of potassium  Bagel (egg or plain) - 1 four- inch bagel has 70 mg of potassium  Beans (green) -  cup has 90 mg of potassium  Beans (yellow) -  cup has 190 mg of potassium  Beer, regular - 12 oz has 100 mg of potassium  Beets (canned) -  cup has 125 mg of potassium  Blackberries -  cup has 115 mg of potassium  Blueberries -  cup has 60 mg of potassium  Bread (whole wheat) - 1 slice has 70 mg of potassium  Broccoli (raw) -  cup has 145 mg of potassium  Cabbage -  cup has 150 mg of potassium  Carrots (cooked or raw) -  cup has 180 mg of potassium  Cauliflower (raw) -  cup has 150 mg of potassium  Celery (raw) -  cup has  155 mg of  potassium  Cereal, bran flakes -  cup has 120-150 mg of potassium  Cheese (cottage) -  cup has 110 mg of potassium  Cherries - 10 have 150 mg of potassium  Chocolate - 1 oz bar has 165 mg of potassium  Coffee (brewed) - 6 oz has 90 mg of potassium  Corn -  cup or 1 ear has 195 mg of potassium  Cucumbers -  cup has 80 mg of potassium  Egg - 1 large egg has 60 mg of potassium  Eggplant -  cup has 60 mg of potassium  Endive (raw) -  cup has 80 mg of potassium  English muffin - 1 has 65 mg of potassium  Fish (ocean perch) - 3 oz has 192 mg of potassium  Frankfurter, beef or pork - 1 has 75 mg of potassium  Fruit cocktail -  cup has 115 mg of potassium  Grape juice -  cup has 170 mg of potassium  Grapefruit -  fruit has 175 mg of potassium  Grapes -  cup has 155 mg of potassium  Greens: kale, turnip, collard -  cup has 110-150 mg of potassium  Ice cream or frozen yogurt (chocolate) -  cup has 175 mg of potassium  Ice cream or frozen yogurt (vanilla) -  cup has 120-150 mg of potassium  Lemons, limes - 1 each has 80 mg of potassium  Lettuce - 1 cup has 100 mg of potassium  Mixed vegetables -  cup has 150 mg of potassium  Mushrooms, raw -  cup has 110 mg of potassium  Nuts (walnuts, pecans, or macadamia) - 1 oz has 125 mg of potassium  Oatmeal -  cup has 80 mg of potassium  Okra -  cup has 110 mg of potassium  Onions -  cup has 120 mg of potassium  Peach - 1 has 185 mg of potassium  Peaches (canned) -  cup has 120 mg of potassium  Pears (canned) -  cup has 120 mg of potassium  Peas, green (frozen) -  cup has 90 mg of potassium  Peppers (Green) -  cup has 130 mg of potassium  Peppers (Red) -  cup has 160 mg of potassium  Pineapple juice -  cup has 165 mg of potassium  Pineapple (fresh or canned) -  cup has 100 mg of potassium  Plums - 1 has 105 mg of potassium  Pudding, vanilla -  cup has 150 mg of potassium  Raspberries  -  cup has 90 mg of potassium  Rhubarb -  cup has 115 mg of potassium  Rice, wild -  cup has 80 mg of potassium  Shrimp - 3 oz has 155 mg of potassium  Spinach (raw) - 1 cup has 170 mg of potassium  Strawberries -  cup has 125 mg of potassium  Summer squash -  cup has 175-200 mg of potassium  Swiss chard (raw) - 1 cup has 135 mg of potassium  Tangerines - 1 fruit has 140 mg of potassium  Tea, brewed - 6 oz has 65 mg of potassium  Turnips -  cup has 140 mg of potassium  Watermelon -  cup has 85 mg of potassium  Wine (Red, table) - 5 oz has 180 mg of potassium  Wine (White, table) - 5 oz 100 mg of potassium Low in potassium The following foods and beverages have less than 50 mg of potassium per serving.  Bread (white) -  1 slice has 30 mg of potassium  Carbonated beverages - 12 oz has less than 5 mg of potassium  Cheese - 1 oz has 20-30 mg of potassium  Cranberries -  cup has 45 mg of potassium  Cranberry juice cocktail -  cup has 20 mg of potassium  Fats and oils - 1 Tbsp has less than 5 mg of potassium  Hummus - 1 Tbsp has 32 mg of potassium  Nectar (papaya, mango, or pear) -  cup has 35 mg of potassium  Rice (white or brown) -  cup has 50 mg of potassium  Spaghetti or macaroni (cooked) -  cup has 30 mg of potassium  Tortilla, flour or corn - 1 has 50 mg of potassium  Waffle - 1 four-inch waffle has 50 mg of potassium  Water chestnuts -  cup has 40 mg of potassium Summary  Potassium is a mineral found in many foods and drinks. It affects how the heart works, and helps keep fluids and minerals balanced in the body.  The amount of potassium you need each day depends on your age and any existing medical conditions you may have. Your health care provider or dietitian may recommend an amount of potassium that you should have each day. This information is not intended to replace advice given to you by your health care provider. Make sure you discuss  any questions you have with your health care provider. Document Released: 08/19/2004 Document Revised: 12/18/2016 Document Reviewed: 04/01/2016 Elsevier Patient Education  [place].

## 2018-11-15 NOTE — Progress Notes (Signed)
Established Patient Office Visit  Subjective:  Patient ID: Jennifer [name], female    DOB: Mar 25, 1965  Age: 53 y.o. MRN: [id number]  CC:  Chief Complaint  Patient presents with  . Follow-up    from Urgent Care   . Wheezing    hurting in right lung/upper respiratory     HPI [name] presents for pt with sinus pressure. Pt with cough-productive yellow intermittently, no fever Pt with no binge/purge x 1 month-seeing psy.  Neuro stopped maxalt and started imitrex yesterday for headaches Right posterior chest with tenderness with movement intermittently. NO SOB.   Past Medical History:  Diagnosis Date  . Anxiety   . Binge-eating and purging type anorexia nervosa   . Bipolar disorder ([place])   . Cystitis, interstitial   . Depression   . Esophageal polyp    about 20 years ago  . GERD (gastroesophageal reflux disease)   . OCD (obsessive compulsive disorder)     Past Surgical History:  Procedure Laterality Date  . BLADDER SURGERY    . CHOLECYSTECTOMY    . ESOPHAGOGASTRODUODENOSCOPY  2017  . ESOPHAGOGASTRODUODENOSCOPY (EGD) WITH PROPOFOL N/A 10/17/2018   Dr. Gala Romney: mild reflux esophagitis, small hiatal hernia  . VOCAL CORD LATERALIZATION, ENDOSCOPIC APPROACH W/ MLB      Family History  Problem Relation Age of Onset  . Healthy Mother   . Healthy Father   . Colon cancer Neg Hx   . Colon polyps Neg Hx     Social History   Socioeconomic History  . Marital status: Married    Spouse name: Not on file  . Number of children: Not on file  . Years of education: Not on file  . Highest education level: Not on file  Occupational History  . Not on file  Social Needs  . Financial resource strain: Not on file  . Food insecurity    Worry: Not on file    Inability: Not on file  . Transportation needs    Medical: Not on file    Non-medical: Not on file  Tobacco Use  . Smoking status: Current Every Day Smoker    Packs/day: 0.50    Years: 20.00    Pack years: 10.00   Types: Cigarettes    Start date: 01/20/1988  . Smokeless tobacco: Never Used  Substance and Sexual Activity  . Alcohol use: No    Alcohol/week: 0.0 standard drinks  . Drug use: No  . Sexual activity: Yes    Birth control/protection: None  Lifestyle  . Physical activity    Days per week: Not on file    Minutes per session: Not on file  . Stress: Not on file  Relationships  . Social Herbalist on phone: Not on file    Gets together: Not on file    Attends religious service: Not on file    Active member of club or organization: Not on file    Attends meetings of clubs or organizations: Not on file    Relationship status: Not on file  . Intimate partner violence    Fear of current or ex partner: Not on file    Emotionally abused: Not on file    Physically abused: Not on file    Forced sexual activity: Not on file  Other Topics Concern  . Not on file  Social History Narrative   Still having marital discord.  Last night she had a conversation with her husband and told him  she was planning to leave him.  He got upset and said he would do anything to with him.  He has called their pastor and set up a counseling session for Sunday.  She states "I do not know if I will even go.  I feel like I have been trying for a long time and he has not been trying at all"    Outpatient Medications Prior to Visit  Medication Sig Dispense Refill  . acetaminophen (TYLENOL) 500 MG tablet Take 1,000 mg by mouth every 6 (six) hours as needed for moderate pain or headache.    . albuterol (VENTOLIN HFA) 108 (90 Base) MCG/ACT inhaler Inhale 1-2 puffs into the lungs every 6 (six) hours as needed for wheezing or shortness of breath. 18 g 0  . benzonatate (TESSALON) 100 MG capsule Take 1 capsule (100 mg total) by mouth every 8 (eight) hours. 21 capsule 0  . clonazePAM (KLONOPIN) 0.5 MG tablet Take 1 tablet (0.5 mg total) by mouth 2 (two) times daily as needed for anxiety. 20 tablet 1  . cycloSPORINE  (RESTASIS) 0.05 % ophthalmic emulsion Place 1 drop into both eyes 2 (two) times daily.     Marland Kitchen dicyclomine (BENTYL) 20 MG tablet Take 1 tablet (20 mg total) by mouth 3 (three) times daily as needed for spasms (abdominal cramping). 20 tablet 0  . divalproex (DEPAKOTE ER) 500 MG 24 hr tablet Take 1 tablet (500 mg total) by mouth daily. 30 tablet 5  . famotidine (PEPCID) 20 MG tablet Take 1 tablet (20 mg total) by mouth 2 (two) times daily. 60 tablet 5  . fluticasone (FLONASE) 50 MCG/ACT nasal spray Place 1 spray into both nostrils daily as needed for allergies.     . fluvoxaMINE (LUVOX) 100 MG tablet Take 3 po qd (Patient taking differently: Take 300 mg by mouth daily. ) 270 tablet 1  . gabapentin (NEURONTIN) 600 MG tablet Take 1 tablet (600 mg total) by mouth 4 (four) times daily. (Patient taking differently: Take 1,200 mg by mouth 2 (two) times daily. ) 120 tablet 5  . hydrOXYzine (ATARAX/VISTARIL) 50 MG tablet 1-2 po q6hr prn (Patient taking differently: Take 50-100 mg by mouth every 6 (six) hours as needed for anxiety. ) 90 tablet 5  . lurasidone (LATUDA) 40 MG TABS tablet Take 1 tablet (40 mg total) by mouth daily with breakfast. Must take with food. (Patient taking differently: Take 40 mg by mouth daily after supper. ) 30 tablet 5  . olopatadine (PATANOL) 0.1 % ophthalmic solution Place 1 drop into both eyes 2 (two) times daily.     . ondansetron (ZOFRAN ODT) 4 MG disintegrating tablet Take 1 tablet (4 mg total) by mouth every 8 (eight) hours as needed for nausea or vomiting. 30 tablet 1  . pantoprazole (PROTONIX) 40 MG tablet Take 1 tablet (40 mg total) by mouth 2 (two) times daily before a meal. 90 tablet 3  . sucralfate (CARAFATE) 1 g tablet Take 1 tablet by mouth 2 (two) times a day.    . SUMAtriptan (IMITREX) 50 MG tablet Take 1 tablet and May repeat in 2 hours if headache persists or recurs. (Do not exceed more then 2 per 24 hour period) 10 tablet 0  . traZODone (DESYREL) 100 MG tablet Take 1-2  tablets (100-200 mg total) by mouth at bedtime as needed for sleep. 60 tablet 5  . valACYclovir (VALTREX) 500 MG tablet Take 500 mg by mouth daily as needed (outbreak).     Marland Kitchen  rizatriptan (MAXALT) 10 MG tablet Take 10 mg by mouth as needed for migraine. May repeat in 2 hours if needed     No facility-administered medications prior to visit.     Allergies  Allergen Reactions  . Codeine Hives and Shortness Of Breath  . Sonata [Zaleplon] Other (See Comments)    Hallucinations  . Meloxicam Other (See Comments)    Possible chest tightness - instructed by MD not to take  . Other Nausea Only    CODIENE-unable to enter under "Agent" for some reason  . Topamax [Topiramate] Other (See Comments)    Low BP and dizziness  . Emgality [Galcanezumab-Gnlm] Rash  . Penicillins Hives, Swelling and Rash    Has patient had a PCN reaction causing immediate rash, facial/tongue/throat swelling, SOB or lightheadedness with hypotension: yes Has patient had a PCN reaction causing severe rash involving mucus membranes or skin necrosis: no Has patient had a PCN reaction that required hospitalization: no Has patient had a PCN reaction occurring within the last 10 years: no If all of the above answers are "NO", then may proceed with Cephalosporin use.;     ROS Review of Systems  Constitutional: Positive for fatigue. Negative for fever.  HENT: Positive for congestion.   Respiratory: Positive for wheezing.   Cardiovascular: Negative.   Gastrointestinal:       No throwing up x 1 month  Psychiatric/Behavioral: The patient is nervous/anxious.        Seeing psy      Objective:    Physical Exam  Constitutional: She appears well-developed and well-nourished.  HENT:  Head: Normocephalic and atraumatic.  Right Ear: External ear normal.  Left Ear: External ear normal.  Nose: Nose normal.  Mouth/Throat: Oropharynx is clear and moist.  Eyes: Conjunctivae are normal.  Neck: Normal range of motion. Neck supple.   Cardiovascular: Normal rate, regular rhythm and normal heart sounds.  Pulmonary/Chest: Effort normal and breath sounds normal. She exhibits tenderness.  Psychiatric: She has a normal mood and affect. Her behavior is normal.    BP 120/83 (BP Location: Left Arm, Patient Position: Sitting, Cuff Size: Normal)   Pulse 75   Temp 98.8 F (37.1 C) (Oral)   Ht 5\' 4"  (1.626 m)   Wt 174 lb 3.2 oz (79 kg)   LMP 10/07/2011 Comment: neg preg  SpO2 96%   BMI 29.90 kg/m  Wt Readings from Last 3 Encounters:  11/15/18 174 lb 3.2 oz (79 kg)  10/31/18 178 lb (80.7 kg)  10/26/18 178 lb (80.7 kg)     Health Maintenance Due  Topic Date Due  . HIV Screening  09/02/1980  . TETANUS/TDAP  09/02/1984  . MAMMOGRAM  09/03/2015  . COLONOSCOPY  09/03/2015  . PAP SMEAR-Modifier  05/23/2016  . INFLUENZA VACCINE  08/20/2018    There are no preventive care reminders to display for this patient.  Lab Results  Component Value Date   TSH 1.392 12/06/2015   Lab Results  Component Value Date   WBC 7.5 10/27/2018   HGB 13.5 10/27/2018   HCT 41.1 10/27/2018   MCV 89.7 10/27/2018   PLT 201 10/27/2018   Lab Results  Component Value Date   NA 135 10/26/2018   K 3.6 10/26/2018   CO2 28 10/26/2018   GLUCOSE 101 (H) 10/26/2018   BUN 10 10/26/2018   CREATININE 0.82 10/26/2018   BILITOT 0.2 (L) 10/26/2018   ALKPHOS 77 10/26/2018   AST 19 10/26/2018   ALT 15 10/26/2018   PROT  7.3 10/26/2018   ALBUMIN 4.4 10/26/2018   CALCIUM 9.0 10/26/2018   ANIONGAP 8 10/26/2018   Lab Results  Component Value Date   CHOL 170 12/06/2015   Lab Results  Component Value Date   HDL 61 12/06/2015   Lab Results  Component Value Date   LDLCALC 90 12/06/2015   Lab Results  Component Value Date   TRIG 95 12/06/2015   Lab Results  Component Value Date   CHOLHDL 2.8 12/06/2015   Lab Results  Component Value Date   HGBA1C 5.4 12/06/2015      Assessment & Plan:  1. Sinusitis, unspecified chronicity,  unspecified location mucinex-call/mychart if symptoms worsen-no antibiotics started, increase fluids 2. Chest wall pain Posterior wall tenderness to palpation Follow-up:  prn LISA Hannah Beat, MD

## 2018-11-17 ENCOUNTER — Ambulatory Visit (INDEPENDENT_AMBULATORY_CARE_PROVIDER_SITE_OTHER): Payer: Medicare Other | Admitting: Psychiatry

## 2018-11-17 ENCOUNTER — Other Ambulatory Visit: Payer: Self-pay

## 2018-11-17 DIAGNOSIS — F3160 Bipolar disorder, current episode mixed, unspecified: Secondary | ICD-10-CM | POA: Diagnosis not present
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
## 2018-11-24 ENCOUNTER — Telehealth: Payer: Self-pay | Admitting: Internal Medicine

## 2018-11-24 NOTE — Telephone Encounter (Signed)
[phone] please call patient, she is still having rectal bleeding and wants to know what she should use. Has used desatin and a compound in the past for the issue .

## 2018-11-24 NOTE — Telephone Encounter (Signed)
Tried calling pt. VM is full, will call pt back.

## 2018-11-24 NOTE — Telephone Encounter (Signed)
OK to use cream as prescribed, will be good for fissures and hemorrhoids. Apply per instructions on prescription.   Plan as outlined at time of recent OV for other GI issues.

## 2018-11-24 NOTE — Telephone Encounter (Signed)
Pt was returning a call. [phone]

## 2018-11-24 NOTE — Telephone Encounter (Signed)
Spoke with pt. Pt states she is having some rectal bleeding. Bleeding isn't seen in the toilet but seem when she wipes. The bleeding started after having a bowel movement. Pt states she does strain and has had a history with hemrrhoids and fissures. Pt was previously prescribed Diltiazem 2 % cr HCL/lido 2 cr and asked to apply it to the rectum. Pt also puts a diaper rash cream from time to time. Pt states the the bleeding is very small when she wipes and she isn't sure if the Diltiazem 2%/lido cr should be used. Pt has another refill on the prescription and wants to know if she should refill it. Pt reports burning with mild tenderness at the rectum. Pt reports no itching at her return.

## 2018-11-28 ENCOUNTER — Other Ambulatory Visit: Payer: Self-pay | Admitting: Physician Assistant

## 2018-11-29 NOTE — Telephone Encounter (Signed)
She doesn't have to come in for ov. I was under impression that she was asking if she should refill a prescription another provider gave her in the past for Diltiazem/lidocaine/hydrocortisone.   I don't usually prescribe that combination but we can have [place] ap[facility] compound the Nitroglycerin 0.125%/lidocine/hydrocortisone cream to apply anorectally twice a day for three weeks as needed. One refill.  If no improvement, then recommend face to face visit.

## 2018-11-29 NOTE — Telephone Encounter (Signed)
Spoke with pt. She is scheduled for tomorrow with LSL. Pt wants to know if she can have an ointment called in for her so she doesn't have to come in for her appointment. Pt states she is almost out. Pt states that she doesn't mind coming in for her appointment if needed.

## 2018-11-30 ENCOUNTER — Ambulatory Visit: Payer: Medicare Other | Admitting: Gastroenterology

## 2018-12-01 ENCOUNTER — Ambulatory Visit: Payer: Medicare Other | Admitting: Psychiatry

## 2018-12-01 NOTE — Telephone Encounter (Signed)
Noted. Nitroglycerin 0.125%/lidocine/hydrocortisone cream to apply anorectally twice a day for three weeks as needed. One refill was called into [facility] as directed.

## 2018-12-01 NOTE — Telephone Encounter (Signed)
Jennifer [name], CMA  Mahala Menghini, PA-C        Spoke with pt. Pt's apt was cancelled per pts request. She is going to see how she is doing in 2-3 weeks after using the topical cr. If not better, pt will call back to schedule an appointment if needed. Please send cream into [place].      RECEIVED ABOVE MESSAGE FROM AM.  [name], please call in [facility] cream per instructions. It cannot be sent in electronically.

## 2018-12-05 ENCOUNTER — Telehealth: Payer: Self-pay | Admitting: Internal Medicine

## 2018-12-05 ENCOUNTER — Telehealth: Payer: Self-pay | Admitting: Neurology

## 2018-12-05 NOTE — Telephone Encounter (Signed)
Noted  

## 2018-12-05 NOTE — Telephone Encounter (Signed)
PLEASE CALL PATIENT [id number]  SAID HER DIARRHEA IS GETTING WORSE AND WORSE

## 2018-12-05 NOTE — Telephone Encounter (Signed)
Pt has called expressing great concern about how divalproex (DEPAKOTE ER) 500 MG 24 hr tablet is affecting her.  Pt is asking for a call from RN as soon as she is available to discuss how to come off of this as soon as possible

## 2018-12-05 NOTE — Telephone Encounter (Signed)
LSL, pt called with c/o constant diarrhea and needed help. Pt cancelled her appointment last week. Scheduled pt for tomorrow 12/06/2018 for 10:00. Pt said she had abdominal pain at first and when I asked if she was taking the Carafate pr for abdominal pain, pt said she doesn't have abdominal pain. Pt mentioned she had watery stool and when I asked her to describe her stool, pt said it's not watery but it's loose.

## 2018-12-05 NOTE — Telephone Encounter (Signed)
Called the patient back to discuss her concern. She states that she keeps having diarrhea and that she needs to come off the medication. She states that she has been up all night with severe diarrhea and she is slightly bleeding and she has fissures. She states that she thinks this is the only new thing. I advised the pt that would make Dr Dohmeier aware but when we spoke recently at the end of October she never mentioned diarrhea being a concern with the medication. Asked the pt, since she recently saw her GI MD 11/15/2018, Did she question if Depakote could be playing a factor in this. She said she didn't. I advised that I would recommend her following up with them and reaching out to get there thoughts and opinion because the patient has been on this medication since 10/04/2018 and she is complaining just now of horrible diarrhea and I am not sure that I would correlate the two. I would hate to do away with the Depakote since she has described her headaches being managed and her moods are managed effectively. Advised I would recommend before we decide to titrate off, having her reach out to her GI specialist and they can contact us too to discuss If needed. Patient did agree to this and will contact them because she was just started on protonix and she states she is just panic mode and this is bothering her and she doesn't know what it could be coming from. Patient will reach out to [place] after she discusses with GI.

## 2018-12-05 NOTE — Telephone Encounter (Signed)
Address tomorrow at [place]. History confusing. Last week we started on tx for anal fissure.

## 2018-12-06 ENCOUNTER — Ambulatory Visit: Payer: Medicare Other | Admitting: Gastroenterology

## 2018-12-06 ENCOUNTER — Other Ambulatory Visit: Payer: Self-pay

## 2018-12-06 ENCOUNTER — Encounter: Payer: Self-pay | Admitting: Gastroenterology

## 2018-12-06 VITALS — BP 123/80 | HR 71 | Temp 97.2°F | Ht 64.0 in | Wt 173.2 lb

## 2018-12-06 DIAGNOSIS — R1013 Epigastric pain: Secondary | ICD-10-CM

## 2018-12-06 DIAGNOSIS — R197 Diarrhea, unspecified: Secondary | ICD-10-CM | POA: Diagnosis not present

## 2018-12-06 DIAGNOSIS — K6289 Other specified diseases of anus and rectum: Secondary | ICD-10-CM

## 2018-12-06 HISTORY — DX: Other specified diseases of anus and rectum: K62.89

## 2018-12-06 NOTE — Progress Notes (Signed)
Primary Care Physician: Maryruth Hancock, MD  Primary Gastroenterologist:  Garfield Cornea, MD   Chief Complaint  Patient presents with  . Diarrhea    x 5-6 weeks. ? related to protonix. Did not take last night and stomach does not feel as upset today    HPI: Jennifer [name] is a 53 y.o. female here for next day appointment.  She called in yesterday complaining of diarrhea.  Last seen October 20 via virtual visit for abdominal pain.  Patient's past medical history significant for anxiety, binge eating/purging, OCD, bipolar disorder, interstitial cystitis.  Previously seen by [facility].  She has a history of abdominal pain and/or chest pain with multiple ED visits in the past several months.  Previously noted increased symptoms of severe epigastric pain after purging.  Back in September her omeprazole was switched to pantoprazole and she was advised to take twice daily.  EGD showed mild reflux esophagitis and small hiatal hernia.  CT abdomen pelvis with contrast in June showed moderately large hiatal hernia but otherwise no findings to explain acute abdominal pain.  She has a 4 cm fibroid in the anterior fundus of the uterus.  Labs in June, September, October with normal LFTs, lipase, CBC.  At time of her virtual visit last month she reported that she had not had any induced vomiting for over 45 days.  Felt like her reflux symptoms were much better.  Her abdominal pain had improved but has had approximately 4 episodes since her EGD.  She felt like it may be food related and planned to keep a food journal.  She was also taking Pepcid up to twice daily and Carafate at dinner along with pantoprazole 40 mg twice daily.  She has a history of IBS.  She takes Bentyl at times.  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  Recently called and was prescribed nitroglycerin/hydrocortisone/lidocaine cream.  Feels like it is helping.  She has never had a colonoscopy.  She was unable to complete a  colonoscopy through [facility].  She never got to the Suprep part, developed severe upper abdominal pain after consuming clear liquid diet and the procedure had to be canceled.  Today she presents with complaints of diarrhea for the past 6 weeks.  No solid stools. Loose to watery stools. Having a lot of rectal pain, ?fissures. Cream is helping. No recent antibiotics. No ill contacts.  Typically with IBS-D flares her symptoms only lasted for days.  She does note that her abdominal pain is much improved.  No heartburn.  Continues to abstain from purging. Brought food/symptom diary in today. Patient was concerned that Depakote (started in September 2020) had contributed to diarrhea.  She states her neurologist did not feel like it was contributing to her diarrhea.  She wonders if maybe is related to the pantoprazole.  She had been taking twice daily.  Yesterday she held her evening dose in the morning dose today and feels like her diarrhea has let up some.She is having bowel movements 7-10 times daily.  No melena or rectal bleeding.      Current Outpatient Medications  Medication Sig Dispense Refill  . acetaminophen (TYLENOL) 500 MG tablet Take 1,000 mg by mouth every 6 (six) hours as needed for moderate pain or headache.    . albuterol (VENTOLIN HFA) 108 (90 Base) MCG/ACT inhaler Inhale 1-2 puffs into the lungs every 6 (six) hours as needed for wheezing or shortness of breath. 18 g  0  . clonazePAM (KLONOPIN) 0.5 MG tablet Take 1 tablet (0.5 mg total) by mouth 2 (two) times daily as needed for anxiety. 20 tablet 1  . cycloSPORINE (RESTASIS) 0.05 % ophthalmic emulsion Place 1 drop into both eyes 2 (two) times daily.     Marland Kitchen dicyclomine (BENTYL) 20 MG tablet Take 1 tablet (20 mg total) by mouth 3 (three) times daily as needed for spasms (abdominal cramping). 20 tablet 0  . divalproex (DEPAKOTE ER) 500 MG 24 hr tablet Take 1 tablet (500 mg total) by mouth daily. 30 tablet 5  . famotidine (PEPCID) 20 MG tablet  Take 1 tablet (20 mg total) by mouth 2 (two) times daily. (Patient taking differently: Take 20 mg by mouth daily. ) 60 tablet 5  . fluticasone (FLONASE) 50 MCG/ACT nasal spray Place 1 spray into both nostrils daily as needed for allergies.     . fluvoxaMINE (LUVOX) 100 MG tablet Take 3 po qd (Patient taking differently: Take 300 mg by mouth daily. ) 270 tablet 1  . gabapentin (NEURONTIN) 600 MG tablet Take 2 tablets (1,200 mg total) by mouth 2 (two) times daily. 120 tablet 5  . hydrOXYzine (ATARAX/VISTARIL) 50 MG tablet 1-2 po q6hr prn (Patient taking differently: Take 50-100 mg by mouth every 6 (six) hours as needed for anxiety. ) 90 tablet 5  . lurasidone (LATUDA) 40 MG TABS tablet Take 1 tablet (40 mg total) by mouth daily with breakfast. Must take with food. (Patient taking differently: Take 40 mg by mouth daily after supper. ) 30 tablet 5  . olopatadine (PATANOL) 0.1 % ophthalmic solution Place 1 drop into both eyes 2 (two) times daily.     . ondansetron (ZOFRAN ODT) 4 MG disintegrating tablet Take 1 tablet (4 mg total) by mouth every 8 (eight) hours as needed for nausea or vomiting. 30 tablet 1  . pantoprazole (PROTONIX) 40 MG tablet Take 1 tablet (40 mg total) by mouth 2 (two) times daily before a meal. 90 tablet 3  . sucralfate (CARAFATE) 1 g tablet Take 1 tablet by mouth 2 (two) times a day. Mostly once a day    . SUMAtriptan (IMITREX) 50 MG tablet Take 1 tablet and May repeat in 2 hours if headache persists or recurs. (Do not exceed more then 2 per 24 hour period) 10 tablet 0  . traZODone (DESYREL) 100 MG tablet Take 1-2 tablets (100-200 mg total) by mouth at bedtime as needed for sleep. 60 tablet 5  . valACYclovir (VALTREX) 500 MG tablet Take 500 mg by mouth daily as needed (outbreak).      No current facility-administered medications for this visit.     Allergies as of 12/06/2018 - Review Complete 12/06/2018  Allergen Reaction Noted  . Codeine Hives and Shortness Of Breath 02/15/2013   . Sonata [zaleplon] Other (See Comments) 10/22/2017  . Meloxicam Other (See Comments) 05/08/2014  . Other Nausea Only 10/09/2011  . Topamax [topiramate] Other (See Comments) 10/04/2018  . Emgality [galcanezumab-gnlm] Rash 10/04/2018  . Penicillins Hives, Swelling, and Rash 10/09/2011   Past Medical History:  Diagnosis Date  . Anxiety   . Binge-eating and purging type anorexia nervosa   . Bipolar disorder ([place])   . Cystitis, interstitial   . Depression   . Esophageal polyp    about 20 years ago  . GERD (gastroesophageal reflux disease)   . OCD (obsessive compulsive disorder)    Past Surgical History:  Procedure Laterality Date  . BLADDER SURGERY    .  CHOLECYSTECTOMY    . ESOPHAGOGASTRODUODENOSCOPY  2017  . ESOPHAGOGASTRODUODENOSCOPY (EGD) WITH PROPOFOL N/A 10/17/2018   Dr. Gala Romney: mild reflux esophagitis, small hiatal hernia  . VOCAL CORD LATERALIZATION, ENDOSCOPIC APPROACH W/ MLB     Family History  Problem Relation Age of Onset  . Healthy Mother   . Healthy Father   . Colon cancer Neg Hx   . Colon polyps Neg Hx    Social History   Socioeconomic History  . Marital status: Married    Spouse name: Not on file  . Number of children: Not on file  . Years of education: Not on file  . Highest education level: Not on file  Occupational History  . Not on file  Social Needs  . Financial resource strain: Not on file  . Food insecurity    Worry: Not on file    Inability: Not on file  . Transportation needs    Medical: Not on file    Non-medical: Not on file  Tobacco Use  . Smoking status: Current Every Day Smoker    Packs/day: 0.50    Years: 20.00    Pack years: 10.00    Types: Cigarettes    Start date: 01/20/1988  . Smokeless tobacco: Never Used  Substance and Sexual Activity  . Alcohol use: No    Alcohol/week: 0.0 standard drinks  . Drug use: No  . Sexual activity: Yes    Birth control/protection: None  Lifestyle  . Physical activity    Days per week: Not on  file    Minutes per session: Not on file  . Stress: Not on file  Relationships  . Social Herbalist on phone: Not on file    Gets together: Not on file    Attends religious service: Not on file    Active member of club or organization: Not on file    Attends meetings of clubs or organizations: Not on file    Relationship status: Not on file  Other Topics Concern  . Not on file  Social History Narrative  . Not on file    ROS:  General: Negative for anorexia, weight loss, fever, chills, fatigue, weakness. ENT: Negative for hoarseness, difficulty swallowing , nasal congestion. CV: Negative for chest pain, angina, palpitations, dyspnea on exertion, peripheral edema.  Respiratory: Negative for dyspnea at rest, dyspnea on exertion, cough, sputum, wheezing.  GI: See history of present illness. GU:  Negative for dysuria, hematuria, urinary incontinence, urinary frequency, nocturnal urination.  Endo: Negative for unusual weight change.    Physical Examination:   BP 123/80   Pulse 71   Temp (!) 97.2 F (36.2 C) (Oral)   Ht 5\' 4"  (1.626 m)   Wt 173 lb 3.2 oz (78.6 kg)   LMP 10/07/2011 Comment: neg preg  BMI 29.73 kg/m   General: Well-nourished, well-developed in no acute distress.  Eyes: No icterus. Mouth: Oropharyngeal mucosa moist and pink , no lesions erythema or exudate. Lungs: Clear to auscultation bilaterally.  Heart: Regular rate and rhythm, no murmurs rubs or gallops.  Abdomen: Bowel sounds are normal, nontender, nondistended, no hepatosplenomegaly or masses, no abdominal bruits or hernia , no rebound or guarding.   Extremities: No lower extremity edema. No clubbing or deformities. Neuro: Alert and oriented x 4   Skin: Warm and dry, no jaundice.   Psych: Alert and cooperative, normal mood and affect.  Labs:  Lab Results  Component Value Date   CREATININE 0.82 10/26/2018   BUN  10 10/26/2018   NA 135 10/26/2018   K 3.6 10/26/2018   CL 99 10/26/2018   CO2  28 10/26/2018   Lab Results  Component Value Date   ALT 15 10/26/2018   AST 19 10/26/2018   ALKPHOS 77 10/26/2018   BILITOT 0.2 (L) 10/26/2018   Lab Results  Component Value Date   WBC 7.5 10/27/2018   HGB 13.5 10/27/2018   HCT 41.1 10/27/2018   MCV 89.7 10/27/2018   PLT 201 10/27/2018     Imaging Studies: No results found.

## 2018-12-06 NOTE — Patient Instructions (Addendum)
1. Stop pantoprazole (Protonix) for the next week.  Let me know if your diarrhea resolves.  If it does,  we will switch your pantoprazole to another medication to control your upper abdominal pain, reflux issues. 2. Complete stool studies.  We will contact you with results as available. 3. Continue rectal cream for 3 weeks.  4. Plan for colonoscopy after first of the year. See separate instructions.

## 2018-12-07 ENCOUNTER — Encounter: Payer: Self-pay | Admitting: Physician Assistant

## 2018-12-07 ENCOUNTER — Ambulatory Visit (INDEPENDENT_AMBULATORY_CARE_PROVIDER_SITE_OTHER): Payer: Medicare Other | Admitting: Physician Assistant

## 2018-12-07 DIAGNOSIS — F3132 Bipolar disorder, current episode depressed, moderate: Secondary | ICD-10-CM | POA: Diagnosis not present

## 2018-12-07 DIAGNOSIS — F422 Mixed obsessional thoughts and acts: Secondary | ICD-10-CM | POA: Diagnosis not present

## 2018-12-07 DIAGNOSIS — G4700 Insomnia, unspecified: Secondary | ICD-10-CM | POA: Diagnosis not present

## 2018-12-07 DIAGNOSIS — F509 Eating disorder, unspecified: Secondary | ICD-10-CM | POA: Diagnosis not present

## 2018-12-07 MED ORDER — FLUVOXAMINE MALEATE 100 MG PO TABS: ORAL_TABLET | ORAL | 1 refills | Status: DC

## 2018-12-07 NOTE — Assessment & Plan Note (Signed)
On exam today, likely palpable internal hemorrhoids with some tenderness.  She has mild tenderness on exam, making anorectal fissure less likely although given that she has been treating her rectal pain for over a week it may be that she has a partially healed fissure.  Continue nitroglycerin/hydrocortisone/lidocaine cream for total of 3 weeks.  Work towards colonoscopy as outlined.

## 2018-12-07 NOTE — Telephone Encounter (Signed)
Pt called back wanting to speak to RN to discuss how she is still feeling. Please advise.

## 2018-12-07 NOTE — Progress Notes (Signed)
[facility] Check  Patient ID: Jennifer [name],  MRN: [id number]  PCP: Maryruth Hancock, MD  Date of Evaluation: 12/07/2018 Time spent:15 minutes  Chief Complaint:  Chief Complaint    Depression; Anxiety; Follow-up     Virtual Visit via Telephone Note  I connected with patient by a video enabled telemedicine application or telephone, with their informed consent, and verified patient privacy and that I am speaking with the correct person using two identifiers.  I am private, in my office and the patient is home.  I discussed the limitations, risks, security and privacy concerns of performing an evaluation and management service by telephone and the availability of in person appointments. I also discussed with the patient that there may be a patient responsible charge related to this service. The patient expressed understanding and agreed to proceed.   I discussed the assessment and treatment plan with the patient. The patient was provided an opportunity to ask questions and all were answered. The patient agreed with the plan and demonstrated an understanding of the instructions.   The patient was advised to call back or seek an in-person evaluation if the symptoms worsen or if the condition fails to improve as anticipated.  I provided 15 minutes of non-face-to-face time during this encounter.  HISTORY/CURRENT STATUS:  HPI For routine med check.  A lot of stress, her best friend and pts husband have 'gotten into it.' She's threatened him, and 'she's scared me so I blocked her.'  I had to call the police and I was told to get a restraining order. It's been a mess. But it's kind of blowing over.  I mailed her key back to her and we haven't talked anymore.  Having diarrhea.  Seeing GI who took her off PPI, for a week, to see if that would help, plus they'll do stool test to see if something's wrong. It might be the Depakote but doesn't want to have to go off it.  It is helped with the  anxiety a lot.  "I just want you know what is going on."  She has not purged in 3 to 4 months!  Patient denies loss of interest in usual activities and is able to enjoy things.  Denies decreased energy or motivation.  Appetite has not changed.  No extreme sadness, tearfulness, or feelings of hopelessness.  Denies any changes in concentration, making decisions or remembering things.  Denies suicidal or homicidal thoughts.  States she does get sad if somebody says something hurtful to her but not depressed like she has been in the past.  Patient denies increased energy with decreased need for sleep, no increased talkativeness, no racing thoughts, no impulsivity or risky behaviors, no increased spending, no increased libido, no grandiosity.  Denies dizziness, syncope, seizures, numbness, tingling, tremor, tics, unsteady gait, slurred speech, confusion. Denies muscle or joint pain, stiffness, or dystonia.  Individual Medical History/ Review of Systems: Changes? :Yes    Past medications for mental health diagnoses include: Risperdal, Seroquel, Prozac, Zoloft, Latuda, Wellbutrin, Lamictal, Depakote, Xanax, Ambien, trazodone, Trileptal, Luvox, Topamax, Elavil, Pamelor, BuSpar, doxazosin, prazosin, lithium, Vraylar, [name]  Allergies: Codeine, Sonata [zaleplon], Meloxicam, Other, Topamax [topiramate], Emgality [galcanezumab-gnlm], and Penicillins   Current Medications:  Current Outpatient Medications:  .  acetaminophen (TYLENOL) 500 MG tablet, Take 1,000 mg by mouth every 6 (six) hours as needed for moderate pain or headache., Disp: , Rfl:  .  albuterol (VENTOLIN HFA) 108 (90 Base) MCG/ACT inhaler, Inhale 1-2 puffs into the lungs  every 6 (six) hours as needed for wheezing or shortness of breath., Disp: 18 g, Rfl: 0 .  clonazePAM (KLONOPIN) 0.5 MG tablet, Take 1 tablet (0.5 mg total) by mouth 2 (two) times daily as needed for anxiety., Disp: 20 tablet, Rfl: 1 .  cycloSPORINE (RESTASIS) 0.05 % ophthalmic  emulsion, Place 1 drop into both eyes 2 (two) times daily. , Disp: , Rfl:  .  dicyclomine (BENTYL) 20 MG tablet, Take 1 tablet (20 mg total) by mouth 3 (three) times daily as needed for spasms (abdominal cramping)., Disp: 20 tablet, Rfl: 0 .  divalproex (DEPAKOTE ER) 500 MG 24 hr tablet, Take 1 tablet (500 mg total) by mouth daily., Disp: 30 tablet, Rfl: 5 .  famotidine (PEPCID) 20 MG tablet, Take 1 tablet (20 mg total) by mouth 2 (two) times daily. (Patient taking differently: Take 20 mg by mouth daily. ), Disp: 60 tablet, Rfl: 5 .  fluticasone (FLONASE) 50 MCG/ACT nasal spray, Place 1 spray into both nostrils daily as needed for allergies. , Disp: , Rfl:  .  fluvoxaMINE (LUVOX) 100 MG tablet, Take 3 po qd, Disp: 270 tablet, Rfl: 1 .  gabapentin (NEURONTIN) 600 MG tablet, Take 2 tablets (1,200 mg total) by mouth 2 (two) times daily., Disp: 120 tablet, Rfl: 5 .  hydrOXYzine (ATARAX/VISTARIL) 50 MG tablet, 1-2 po q6hr prn (Patient taking differently: Take 50-100 mg by mouth every 6 (six) hours as needed for anxiety. ), Disp: 90 tablet, Rfl: 5 .  lurasidone (LATUDA) 40 MG TABS tablet, Take 1 tablet (40 mg total) by mouth daily with breakfast. Must take with food. (Patient taking differently: Take 40 mg by mouth daily after supper. ), Disp: 30 tablet, Rfl: 5 .  olopatadine (PATANOL) 0.1 % ophthalmic solution, Place 1 drop into both eyes 2 (two) times daily. , Disp: , Rfl:  .  ondansetron (ZOFRAN ODT) 4 MG disintegrating tablet, Take 1 tablet (4 mg total) by mouth every 8 (eight) hours as needed for nausea or vomiting., Disp: 30 tablet, Rfl: 1 .  pantoprazole (PROTONIX) 40 MG tablet, Take 1 tablet (40 mg total) by mouth 2 (two) times daily before a meal., Disp: 90 tablet, Rfl: 3 .  sucralfate (CARAFATE) 1 g tablet, Take 1 tablet by mouth 2 (two) times a day. Mostly once a day, Disp: , Rfl:  .  SUMAtriptan (IMITREX) 50 MG tablet, Take 1 tablet and May repeat in 2 hours if headache persists or recurs. (Do not  exceed more then 2 per 24 hour period), Disp: 10 tablet, Rfl: 0 .  traZODone (DESYREL) 100 MG tablet, Take 1-2 tablets (100-200 mg total) by mouth at bedtime as needed for sleep., Disp: 60 tablet, Rfl: 5 .  valACYclovir (VALTREX) 500 MG tablet, Take 500 mg by mouth daily as needed (outbreak). , Disp: , Rfl:  Medication Side Effects: none  Family Medical/ Social History: Changes? No  MENTAL HEALTH EXAM:  Last menstrual period 10/07/2011.There is no height or weight on file to calculate BMI.  General Appearance: Unable to assess  Eye Contact:  Unable to assess  Speech:  Clear and Coherent  Volume:  Normal  Mood:  Euthymic  Affect:  Unable to assess  Thought Process:  Goal Directed and Descriptions of Associations: Intact  Orientation:  Full (Time, Place, and Person)  Thought Content: Logical   Suicidal Thoughts:  No  Homicidal Thoughts:  No  Memory:  WNL  Judgement:  Good  Insight:  Good  Psychomotor Activity:  Unable to  assess  Concentration:  Concentration: Good  Recall:  Good  Fund of Knowledge: Good  Language: Good  Assets:  Desire for Improvement  ADL's:  Intact  Cognition: WNL  Prognosis:  Good    DIAGNOSES:    ICD-10-CM   1. Bipolar affective disorder, currently depressed, moderate ([place])  F31.32   2. Insomnia, unspecified type  G47.00   3. Mixed obsessional thoughts and acts  F42.2   4. Eating disorder, unspecified type  F50.9     Receiving Psychotherapy: Yes With Rinaldo Cloud, LCSW   RECOMMENDATIONS:  Continue Luvox 300 mg nightly. Continue Klonopin 0.5 mg twice daily as needed.  She takes this rarely. Continue gabapentin 600 mg, 2 p.o. around lunchtime and then 2 p.o. nightly. Continue Depakote ER 500 mg daily per neurology. Continue hydroxyzine 50 mg 1 or 2 every 6 hours as needed anxiety. Continue Latuda 40 mg q. evening with food. Continue trazodone 100 mg, 1-2 nightly as needed. Sleep hygiene discussed. Continue psychotherapy with Rinaldo Cloud,  LCSW. Return in 4 to 6 weeks.  [name], PA-C

## 2018-12-07 NOTE — Assessment & Plan Note (Signed)
Much improved since she stopped purging.  Interestingly on her EGD she only had mild reflux esophagitis and her mild hernia was felt to be small.  Previous CT suggested moderately large hiatal hernia.  Some of her symptoms may be related to purging in the setting of a hiatal hernia.  At this point she is improved.  Recommend refraining from purging.  Unfortunately she has developed diarrhea since starting pantoprazole, possibly side effect.  We will have her hold pantoprazole for the next week.  Let me know if your diarrhea resolves.  If it does, we will switch pantoprazole to another medication.

## 2018-12-07 NOTE — Assessment & Plan Note (Signed)
Patient with ongoing diarrhea.  Initially she thought it was related to [place].  Then she suspected Depakote.  Remains on both.  She believes symptoms are somewhat better with holding pantoprazole the last couple of doses.  We will let her hold pantoprazole for a week to see if it makes any difference in her diarrhea.  We will also check stool studies for completeness.  Further recommendations to follow.  She has never had a colonoscopy.  We will go ahead and schedule for after the first of the year.

## 2018-12-08 NOTE — Telephone Encounter (Signed)
Called the patient back and she wanted to update that she did go into the GI MD. She was told that she would stop the protonix and it can take up to 5 days for that to clear out of the symptoms. Pt states that she will call us if after being off the protonix for 7 days if the issues are continuing. Informed her that I would update the chart.

## 2018-12-12 ENCOUNTER — Telehealth: Payer: Self-pay

## 2018-12-12 LAB — C. DIFFICILE GDH AND TOXIN A/B
GDH ANTIGEN: NOT DETECTED
MICRO NUMBER:: 1120620
SPECIMEN QUALITY:: ADEQUATE
TOXIN A AND B: NOT DETECTED

## 2018-12-12 LAB — GASTROINTESTINAL PATHOGEN PANEL PCR
C. difficile Tox A/B, PCR: NOT DETECTED
Campylobacter, PCR: NOT DETECTED
Cryptosporidium, PCR: NOT DETECTED
E coli (ETEC) LT/ST PCR: NOT DETECTED
E coli (STEC) stx1/stx2, PCR: NOT DETECTED
E coli 0157, PCR: NOT DETECTED
Giardia lamblia, PCR: NOT DETECTED
Norovirus, PCR: NOT DETECTED
Rotavirus A, PCR: NOT DETECTED
Salmonella, PCR: NOT DETECTED
Shigella, PCR: NOT DETECTED

## 2018-12-12 MED ORDER — HYOSCYAMINE SULFATE 0.125 MG PO TABS: 0.1250 mg | ORAL_TABLET | ORAL | 0 refills | Status: DC | PRN

## 2018-12-12 NOTE — Addendum Note (Signed)
Addended by: Mahala Menghini on: 12/12/2018 06:18 PM   Modules accepted: Orders

## 2018-12-12 NOTE — Telephone Encounter (Addendum)
Stool pathogen panel pending.   Stool neg for C.diff.  Will send in RX for Levsin. She can take in addition to Bentyl to slow stools. HOLD if gets constipation.   I advised her to hold pantoprazole at last office visit. She will need to discuss holding depakote with neurologist but agree with waiting for stool studies to rule out infection before pursuing holding depakote.   Avoid daIry for now as it will make diarrhea worse.   IS SHE ON LIST TO BE SCHEDULED FOR TCS?

## 2018-12-12 NOTE — Telephone Encounter (Signed)
VM received from pt. Stating that the diarrhea has returned. A couple days ago in the morning, pt states the diarrhea was a little more formed. by he afternoon, pt states the diarrhea is watery. Pt is having rectal pain due to going to the bathroom so much. Pt is using the topical cream prescribed. Pt took her sample up to the lab last Thursday 12/07/17. Pt states the blood is seen on the tissue after she wipes. Pt also states she wants to d/c the Depakote and Pantoprazole. Pt doesn't know what to take to stop the diarrhea. Pt doesn't like to take Imodium since it is hard for stool to comes out when she takes. When asked if pt is eating anything different or if she feels the foods she eats causes the diarrhea, pt states she eats cereal with skim milk and she ate some cheese eggs that made her go to the bathroom. Pt states she is going to the bathroom so much, she can't give the amount of times she goes throughout the day.

## 2018-12-13 ENCOUNTER — Other Ambulatory Visit: Payer: Self-pay

## 2018-12-13 ENCOUNTER — Encounter: Payer: Self-pay | Admitting: Neurology

## 2018-12-13 ENCOUNTER — Ambulatory Visit (INDEPENDENT_AMBULATORY_CARE_PROVIDER_SITE_OTHER): Payer: Medicare Other | Admitting: Psychiatry

## 2018-12-13 DIAGNOSIS — F3132 Bipolar disorder, current episode depressed, moderate: Secondary | ICD-10-CM | POA: Diagnosis not present

## 2018-12-13 MED ORDER — RABEPRAZOLE SODIUM 20 MG PO TBEC: 20.0000 mg | DELAYED_RELEASE_TABLET | Freq: Every day | ORAL | 1 refills | Status: DC

## 2018-12-13 NOTE — Progress Notes (Signed)
[facility] Counselor/Therapist Progress Note  Patient ID: Jennifer [name], MRN: [id number],    Date: 12/13/2018  Time Spent:  60 minutes   11:00am to 12:00noon   Virtual Visit with Video Note Connected with patient by a video enabled telemedicine/telehealth application or telephone, with their informed consent, and verified patient privacy and that I am speaking with the correct person using two identifiers. I discussed the limitations, risks, security and privacy concerns of performing psychotherapy and management service by telephone and the availability of in person appointments. I also discussed with the patient that there may be a patient responsible charge related to this service. The patient expressed understanding and agreed to proceed. I discussed the treatment planning with the patient. The patient was provided an opportunity to ask questions and all were answered. The patient agreed with the plan and demonstrated an understanding of the instructions. The patient was advised to call  our office if  symptoms worsen or feel they are in a crisis state and need immediate contact.   Therapist Location: [name] Patient Location: home  Treatment Type: Individual Therapy  Reported Symptoms:  Anxiety, depression, fears,  physical  Issues (gastrointestinal, hemmroids) that have bothered me more recently and is getting treatment  Mental Status Exam:  Appearance:   Casual     Behavior:  Appropriate and Sharing  Motor:  Normal  Speech/Language:   A bit fast part of the conversation but was able to slow down more appropriately  Affect:  anxious, some depression  Mood:  anxious  Thought process:  goal directed  Thought content:    WNL, but with some ruminating  Sensory/Perceptual disturbances:    WNL  Orientation:  oriented to person, place, time/date, situation, day of week, month of year and year  Attention:  Fair  Concentration:  Fair  Memory:  WNL  Fund of  knowledge:   Good  Insight:    Fair  Judgment:   Fair  Impulse Control:  Fair   Risk Assessment: Danger to Self:  No Self-injurious Behavior: No Danger to Others: No Duty to Warn:no Physical Aggression / Violence:No  Access to Firearms a concern: No  Gang Involvement:No   Subjective:  Patient reporting anxiety and depression, along with physical problems noted above.  States I always am anxious and depressed at holidays. Concerns about issues with friend "Jennifer [name]" and talked through this some.  She agreed she needs to set boundaries and states she "is already just started that ."  Interventions: Cognitive Behavioral Therapy and Solution-Oriented/Positive Psychology  Diagnosis:   ICD-10-CM   1. Bipolar affective disorder, currently depressed, moderate ([place])  F31.32      Plan: Treatment goals may remain the same for a while until met, however progress or lack of, will be noted each session in "Progress" sections.  Treatment Goals Patient is not signing tx goals on computer screen due COVID.  Long term goal: Develop the ability to recognize, accept, and cope with feelings of depression and anxiety successfully in positive and healthy ways.  Short term goal Learn and implement calming skills to reduce overall tension and moments of increased anxiety.  Strategies: 1)Practice calming strategies that she has used in therapy office on occasions with benefit. Remain open to learning new ones.(breathing, 5 senses, counting, pause button) 2)Take her medications as prescribed. 3) Will learn and practice CBT strategies relating to changing her anxious and depressed thoughts to more hopeful and empowering thoughts. 4) Continue taking her medications as  prescribed by her doctors.  PROGRESS: Patient very anxious and stressed intially today. Vented freely over multiple topics that she said she had to talk about including a friend with which she is having issues and also her husband.   Is uncomfortable physically due to some medical problems currently and that is impacting mood as well.  After expressing herself about concerns with friend and husband, I encouraged her to look back at her goals and strategies as noted above, as they could be quite helpful in dealing with the relationship with her friend and husband, as well as patient's own well being. She continues to have difficulty focusing on goals and staying with it.  Due to her difficulty calming herself, I got her engaged in slow deliberate deep breathing exercises which she did and it did help for a while.  When she got quite anxious again, I dad her do the breathing exercises again this time for a bit longer, and encouraged her again to use this strategy daily (multiple times a day) when she is at home. As noted with the use of deep breathing exercises, we were able to work on her short-term goal and some on her long term goal (in terms of her anxious thoughts and the practice of being aware of them and interrupting them, which is problematic for patient, but we are working on trying to help her feel more calm and grounded during stressful times so as to do some interruptions and eventual replacements of anxious/negative thoughts.)  Goal review and some progress noted with patient, as well as her willingness to put forth more effort today.   Next appt within [place], LCSW

## 2018-12-13 NOTE — Telephone Encounter (Signed)
Stop pantoprazole. Start aciphex 20mg  daily before breakfast. rx sent.   Let's go ahead and get her on schedule or list to be scheduled for colonoscopy if she is not already. Instructions provided day of office visit.   If diarrhea is persistent, we will need TCS to help figure that out.

## 2018-12-13 NOTE — Telephone Encounter (Signed)
Called the patient to ask more information. Patient didn't not answer the phone and her VM was full and was unable to leave a message. I will send her a mychart message.   If patient calls back please question what exactly is she wanting for her stomach? If its for nausea the patient is already order zofran to help with that. Is she taking Zofran for nausea? If she is asking for something else, Given her GI history I would be hesitant to recommend alternative medications since she was having reaction to protonix and is coming off of that medication.   Please advise the patient that Dr Brett Fairy is out of the office until next Tuesday and it may be beneficial for her to reach out to her GI specialist in relation to stomach concerns.

## 2018-12-13 NOTE — Telephone Encounter (Signed)
Pt is calling in wanting to know if something can be given for her to help her stomach while on Depakote.

## 2018-12-13 NOTE — Telephone Encounter (Signed)
Patient already in our stack to be called once we receive march schedule

## 2018-12-13 NOTE — Telephone Encounter (Signed)
Pt notified of stool results. Pt is also aware that Levsin was called in and can be used in addition to the Bentyl. Hold for constipation. Pt is holding the Pantoprazole as directed at her last ov and will discuss with her doctor about d/c Depakote.   Pt is asking today what is she going to take the place of Pantoprazole. Pt states she has ulcers and a hiatal hernia and she doesn't want to be in worse shape. Pt states that when she was asked to d/c Pantoprazole, she was told she would be placed on something else.   Pt asked the nurse to hold off on scheduling her TCS until March. Pt states her stomach needs to be better before she has a TCS and she doesn't want it done until March.

## 2018-12-13 NOTE — Addendum Note (Signed)
Addended by: Mahala Menghini on: 12/13/2018 12:15 PM   Modules accepted: Orders

## 2018-12-13 NOTE — Telephone Encounter (Signed)
Spoke with pt. Pt is aware that she will start Aciphex 20 mg and it was sent to her pharmacy.

## 2018-12-17 ENCOUNTER — Encounter: Payer: Self-pay | Admitting: Gastroenterology

## 2018-12-19 ENCOUNTER — Telehealth: Payer: Self-pay

## 2018-12-19 ENCOUNTER — Encounter: Payer: Self-pay | Admitting: *Deleted

## 2018-12-19 ENCOUNTER — Other Ambulatory Visit: Payer: Self-pay

## 2018-12-19 DIAGNOSIS — K6289 Other specified diseases of anus and rectum: Secondary | ICD-10-CM

## 2018-12-19 DIAGNOSIS — R197 Diarrhea, unspecified: Secondary | ICD-10-CM

## 2018-12-19 MED ORDER — SUPREP BOWEL PREP KIT 17.5-3.13-1.6 GM/177ML PO SOLN: 1.0000 | ORAL | 0 refills | Status: DC

## 2018-12-19 NOTE — Telephone Encounter (Signed)
Called pt, TCS w/Propofol w/RMR scheduled for 03/23/19 at 7:30am. Rx for prep sent to pharmacy. Orders entered.

## 2018-12-29 ENCOUNTER — Other Ambulatory Visit: Payer: Self-pay

## 2018-12-29 ENCOUNTER — Ambulatory Visit (INDEPENDENT_AMBULATORY_CARE_PROVIDER_SITE_OTHER): Payer: Medicare Other | Admitting: Psychiatry

## 2018-12-29 DIAGNOSIS — F3132 Bipolar disorder, current episode depressed, moderate: Secondary | ICD-10-CM

## 2018-12-29 NOTE — Progress Notes (Signed)
[facility] Counselor/Therapist Progress Note  Patient ID: Jennifer [name], MRN: [id number],    Date: 12/29/2018  Time Spent:  60 minutes  11:00am to 12:00noon  Virtual Visit with Video Note Connected with patient by a video enabled telemedicine/telehealth application or telephone, with their informed consent, and verified patient privacy and that I am speaking with the correct person using two identifiers. I discussed the limitations, risks, security and privacy concerns of performing psychotherapy and management service by telephone and the availability of in person appointments. I also discussed with the patient that there may be a patient responsible charge related to this service. The patient expressed understanding and agreed to proceed. I discussed the treatment planning with the patient. The patient was provided an opportunity to ask questions and all were answered. The patient agreed with the plan and demonstrated an understanding of the instructions. The patient was advised to call  our office if  symptoms worsen or feel they are in a crisis state and need immediate contact.   Therapist Location: [name] Patient Location: home  Treatment Type: Individual Therapy  Reported Symptoms:  Anxiety, some obsessive thoughts, depressed, some tearfulness, no current SI, but has within past 2 weeks and reports that what helps is for her to get involved in an activity she enjoys such as sewing,   Mental Status Exam:  Appearance:   Casual     Behavior:  Appropriate and Sharing  Motor:  Normal  Speech/Language:   a bit pressured at times,  Affect:  anxious  Mood:  anxious and worried  Thought process:  goal directed  Thought content:    WNL  Sensory/Perceptual disturbances:    WNL  Orientation:  oriented to person, place, time/date, situation, day of week, month of year and year  Attention:  Good  Concentration:  Fair  Memory:  WNL  Fund of knowledge:   Good   Insight:    Fair  Judgment:   Fair  Impulse Control:  Fair   Risk Assessment: Danger to Self:  No Self-injurious Behavior: No Danger to Others: No Duty to Warn:no Physical Aggression / Violence:No  Access to Firearms a concern: No  Gang Involvement:No   Subjective: Patient today states that she still gets down a lot especially when husband "talks down to me" but "overall I may be a little better and sometimes gets along better with husband.  Not interested in marital therapy. Reports looking at positive quotes on facebook helps her. Thinking about "God loving me" helps too. Difficult to hang onto positives.  Interventions: Cognitive Behavioral Therapy and Solution-Oriented/Positive Psychology  Diagnosis:   ICD-10-CM   1. Bipolar affective disorder, currently depressed, moderate ([place])  F31.32      Plan: Treatment goals may remain the same for a while until met, however progress or lack of, will be noted each session in "Progress" sections.  Treatment Goals Patient is not signing tx goals on computer screen due COVID.  Long term goal: Develop the ability to recognize, accept, and cope with feelings of depression and anxiety successfully in positive and healthy ways.  Short term goal Learn and implement calming skills to reduce overall tension and moments of increased anxiety.  Strategies: 1)Practice calming strategies that she has used in therapy office on occasions with benefit. Remain open to learning new ones.(breathing, 5 senses, counting, pause button) 2)Take her medications as prescribed. 3) Will learn and practice CBT strategies relating to changing her anxious and depressed thoughts to more  hopeful and empowering thoughts.  PROGRESS: Patient reports that she is working on her therapy goals and admits it is hard for her to hold on to positives.  Gets frustrated easily and hard to stick with a strategy that doesn't produce results quickly. She is practicing  calming strategies (mindful deep breathing exercises, journaling, sewing) on her own and in session today as she has had a couple rough times since last appt, but also some at least slightly better times.  She reports remaining on her meds as prescribed.  Has had some physical issues that has impacted her emotional health, but doing some better physically. Is quick to assume something may go wrong versus right and expressing a lot of automatic thoughts today that reflect anxiety that seems to come from her negative assumptions. Worked on some negative/anxious/depressive thought identification and replacement. Patient able to do this but then has difficulty sticking with the more positive replacement thoughts without doubting them. Will continue to work with patient on this, and needing to get back to working on her cognitve distortions.  She is to get her CBT workbook back out and pick back up with the exercises we were using previously until next session.  Goal review and progress/effort noted with patient.     Next appt within 2-3 weeks.    Shanon Ace, LCSW

## 2019-01-01 ENCOUNTER — Ambulatory Visit
Admission: EM | Admit: 2019-01-01 | Discharge: 2019-01-01 | Disposition: A | Discharge status: Home / Self Care | Payer: Medicare Other | Attending: Emergency Medicine | Admitting: Emergency Medicine

## 2019-01-01 ENCOUNTER — Other Ambulatory Visit: Payer: Self-pay

## 2019-01-01 DIAGNOSIS — Z20828 Contact with and (suspected) exposure to other viral communicable diseases: Secondary | ICD-10-CM

## 2019-01-01 DIAGNOSIS — H66001 Acute suppurative otitis media without spontaneous rupture of ear drum, right ear: Secondary | ICD-10-CM

## 2019-01-01 DIAGNOSIS — Z20822 Contact with and (suspected) exposure to covid-19: Secondary | ICD-10-CM

## 2019-01-01 DIAGNOSIS — J3489 Other specified disorders of nose and nasal sinuses: Secondary | ICD-10-CM

## 2019-01-01 MED ORDER — AZITHROMYCIN 250 MG PO TABS: 250.0000 mg | ORAL_TABLET | Freq: Every day | ORAL | 0 refills | Status: DC

## 2019-01-01 NOTE — ED Triage Notes (Signed)
Pt presents to UC w/ c/o headache, sore throat, cough x2 days. Pt also has sinus pain on cheeks and right ear pain.

## 2019-01-01 NOTE — Discharge Instructions (Addendum)
COVID testing ordered.  It will take between 5-7 days for test results.  Someone will contact you regarding abnormal results.    In the meantime: You should remain isolated in your home for 10 days from symptom onset AND greater than 72 hours after symptoms resolution (absence of fever without the use of fever-reducing medication and improvement in respiratory symptoms), whichever is longer Get plenty of rest and push fluids Z-pak prescribed.  Take as directed and to completion Use OTC medications like ibuprofen or tylenol as needed fever or pain Call or go to the ED if you have any new or worsening symptoms such as fever, worsening cough, shortness of breath, chest tightness, chest pain, turning blue, changes in mental status, etc..Marland Kitchen

## 2019-01-01 NOTE — ED Provider Notes (Signed)
Jennifer [name]   [id number] 01/01/19 Arrival Time: 1829   CC: RT ear pain, sinus pain/ pressure; COVID symptoms  SUBJECTIVE: History from: patient.  Jennifer [name] is a 53 y.o. female who presents with RT maxillary sinus pain/ pressure and RT ear pain x 2 days.  Denies sick exposure to COVID, flu or strep.  Denies recent travel.  Has tried mucinex DM with relief.  Symptoms are made worse with laying on RT side.  Reports previous symptoms in the past sinus infection.  Complains of associated sore throat, and mild cough, that is chronic in nature.  Denies fever, chills, SOB, wheezing, chest pain, changes in bowel or bladder habits.     ROS: As per HPI.  All other pertinent ROS negative.     Past Medical History:  Diagnosis Date  . Anxiety   . Binge-eating and purging type anorexia nervosa   . Bipolar disorder ([place])   . Cystitis, interstitial   . Depression   . Esophageal polyp    about 20 years ago  . GERD (gastroesophageal reflux disease)   . OCD (obsessive compulsive disorder)    Past Surgical History:  Procedure Laterality Date  . BLADDER SURGERY    . CHOLECYSTECTOMY    . ESOPHAGOGASTRODUODENOSCOPY  09/28/2016   [facility]; Dr. Therisa Doyne; erosions in the esophagus, 5 cm hiatal hernia, nonbleeding erosive gastropathy s/p biopsied, normal duodenum.  Path with chronic inactive gastritis, no H. pylori or intestinal metaplasia.   Marland Kitchen ESOPHAGOGASTRODUODENOSCOPY (EGD) WITH PROPOFOL N/A 10/17/2018   Dr. Gala Romney: mild reflux esophagitis, small hiatal hernia  . VOCAL CORD LATERALIZATION, ENDOSCOPIC APPROACH W/ MLB     Allergies  Allergen Reactions  . Codeine Hives and Shortness Of Breath  . Sonata [Zaleplon] Other (See Comments)    Hallucinations  . Meloxicam Other (See Comments)    Possible chest tightness - instructed by MD not to take  . Other Nausea Only    CODIENE-unable to enter under "Agent" for some reason  . Topamax [Topiramate] Other (See Comments)    Low BP and  dizziness  . Emgality [Galcanezumab-Gnlm] Rash  . Penicillins Hives, Swelling and Rash    Has patient had a PCN reaction causing immediate rash, facial/tongue/throat swelling, SOB or lightheadedness with hypotension: yes Has patient had a PCN reaction causing severe rash involving mucus membranes or skin necrosis: no Has patient had a PCN reaction that required hospitalization: no Has patient had a PCN reaction occurring within the last 10 years: no If all of the above answers are "NO", then may proceed with Cephalosporin use.;    No current facility-administered medications on file prior to encounter.   Current Outpatient Medications on File Prior to Encounter  Medication Sig Dispense Refill  . acetaminophen (TYLENOL) 500 MG tablet Take 1,000 mg by mouth every 6 (six) hours as needed for moderate pain or headache.    . albuterol (VENTOLIN HFA) 108 (90 Base) MCG/ACT inhaler Inhale 1-2 puffs into the lungs every 6 (six) hours as needed for wheezing or shortness of breath. 18 g 0  . clonazePAM (KLONOPIN) 0.5 MG tablet Take 1 tablet (0.5 mg total) by mouth 2 (two) times daily as needed for anxiety. 20 tablet 1  . cycloSPORINE (RESTASIS) 0.05 % ophthalmic emulsion Place 1 drop into both eyes 2 (two) times daily.     Marland Kitchen dicyclomine (BENTYL) 20 MG tablet Take 1 tablet (20 mg total) by mouth 3 (three) times daily as needed for spasms (abdominal cramping). 20 tablet 0  .  divalproex (DEPAKOTE ER) 500 MG 24 hr tablet Take 1 tablet (500 mg total) by mouth daily. 30 tablet 5  . famotidine (PEPCID) 20 MG tablet Take 1 tablet (20 mg total) by mouth 2 (two) times daily. (Patient taking differently: Take 20 mg by mouth daily. ) 60 tablet 5  . fluticasone (FLONASE) 50 MCG/ACT nasal spray Place 1 spray into both nostrils daily as needed for allergies.     . fluvoxaMINE (LUVOX) 100 MG tablet Take 3 po qd 270 tablet 1  . gabapentin (NEURONTIN) 600 MG tablet Take 2 tablets (1,200 mg total) by mouth 2 (two) times  daily. 120 tablet 5  . hydrOXYzine (ATARAX/VISTARIL) 50 MG tablet 1-2 po q6hr prn (Patient taking differently: Take 50-100 mg by mouth every 6 (six) hours as needed for anxiety. ) 90 tablet 5  . hyoscyamine (LEVSIN) 0.125 MG tablet Take 1 tablet (0.125 mg total) by mouth every 4 (four) hours as needed (diarrhea). 120 tablet 0  . lurasidone (LATUDA) 40 MG TABS tablet Take 1 tablet (40 mg total) by mouth daily with breakfast. Must take with food. (Patient taking differently: Take 40 mg by mouth daily after supper. ) 30 tablet 5  . Na Sulfate-K Sulfate-Mg Sulf (SUPREP BOWEL PREP KIT) 17.5-3.13-1.6 GM/177ML SOLN Take 1 kit by mouth as directed. 354 mL 0  . olopatadine (PATANOL) 0.1 % ophthalmic solution Place 1 drop into both eyes 2 (two) times daily.     . ondansetron (ZOFRAN ODT) 4 MG disintegrating tablet Take 1 tablet (4 mg total) by mouth every 8 (eight) hours as needed for nausea or vomiting. 30 tablet 1  . pantoprazole (PROTONIX) 40 MG tablet Take 1 tablet (40 mg total) by mouth 2 (two) times daily before a meal. 90 tablet 3  . RABEprazole (ACIPHEX) 20 MG tablet Take 1 tablet (20 mg total) by mouth daily before breakfast. 30 tablet 1  . sucralfate (CARAFATE) 1 g tablet Take 1 tablet by mouth 2 (two) times a day. Mostly once a day    . SUMAtriptan (IMITREX) 50 MG tablet Take 1 tablet and May repeat in 2 hours if headache persists or recurs. (Do not exceed more then 2 per 24 hour period) 10 tablet 0  . traZODone (DESYREL) 100 MG tablet Take 1-2 tablets (100-200 mg total) by mouth at bedtime as needed for sleep. 60 tablet 5  . valACYclovir (VALTREX) 500 MG tablet Take 500 mg by mouth daily as needed (outbreak).      Social History   Socioeconomic History  . Marital status: Married    Spouse name: Not on file  . Number of children: Not on file  . Years of education: Not on file  . Highest education level: Not on file  Occupational History  . Not on file  Tobacco Use  . Smoking status: Current  Every Day Smoker    Packs/day: 0.50    Years: 20.00    Pack years: 10.00    Types: Cigarettes    Start date: 01/20/1988  . Smokeless tobacco: Never Used  Substance and Sexual Activity  . Alcohol use: No    Alcohol/week: 0.0 standard drinks  . Drug use: No  . Sexual activity: Yes    Birth control/protection: None  Other Topics Concern  . Not on file  Social History Narrative  . Not on file   Social Determinants of Health   Financial Resource Strain:   . Difficulty of Paying Living Expenses: Not on file  Food Insecurity:   .  Worried About Charity fundraiser in the Last Year: Not on file  . Ran Out of Food in the Last Year: Not on file  Transportation Needs:   . Lack of Transportation (Medical): Not on file  . Lack of Transportation (Non-Medical): Not on file  Physical Activity:   . Days of Exercise per Week: Not on file  . Minutes of Exercise per Session: Not on file  Stress:   . Feeling of Stress : Not on file  Social Connections:   . Frequency of Communication with Friends and Family: Not on file  . Frequency of Social Gatherings with Friends and Family: Not on file  . Attends Religious Services: Not on file  . Active Member of Clubs or Organizations: Not on file  . Attends Archivist Meetings: Not on file  . Marital Status: Not on file  Intimate Partner Violence:   . Fear of Current or Ex-Partner: Not on file  . Emotionally Abused: Not on file  . Physically Abused: Not on file  . Sexually Abused: Not on file   Family History  Problem Relation Age of Onset  . Healthy Mother   . Healthy Father   . Colon cancer Neg Hx   . Colon polyps Neg Hx     OBJECTIVE:  Vitals:   01/01/19 1432  BP: 131/85  Pulse: 71  Resp: 16  Temp: 98.6 F (37 C)  TempSrc: Oral  SpO2: 97%     General appearance: alert; appears mildly fatigued, but nontoxic; speaking in full sentences and tolerating own secretions HEENT: NCAT; Ears: EACs clear, LT TM pearly gray, RT TM  mildly injected; Eyes: PERRL.  EOM grossly intact. Sinuses: TTP over maxillary sinuses; Nose: nares patent without rhinorrhea, Throat: oropharynx clear, tonsils non erythematous or enlarged, uvula midline  Neck: supple without LAD Lungs: unlabored respirations, symmetrical air entry; cough: mild; no respiratory distress; CTAB Heart: regular rate and rhythm.   Skin: warm and dry Psychological: alert and cooperative; normal mood and affect  ASSESSMENT & PLAN:  1. Suspected COVID-19 virus infection   2. Non-recurrent acute suppurative otitis media of right ear without spontaneous rupture of tympanic membrane   3. Sinus pain     Meds ordered this encounter  Medications  . DISCONTD: azithromycin (ZITHROMAX) 250 MG tablet    Sig: Take 1 tablet (250 mg total) by mouth daily. Take first 2 tablets together, then 1 every day until finished.    Dispense:  6 tablet    Refill:  0    Order Specific Question:   Supervising Provider    Answer:   Raylene Everts [[id number]]  . azithromycin (ZITHROMAX) 250 MG tablet    Sig: Take 1 tablet (250 mg total) by mouth daily. Take first 2 tablets together, then 1 every day until finished.    Dispense:  6 tablet    Refill:  0    Order Specific Question:   Supervising Provider    Answer:   Raylene Everts [[id number]]    COVID testing ordered.  It will take between 5-7 days for test results.  Someone will contact you regarding abnormal results.    In the meantime: You should remain isolated in your home for 10 days from symptom onset AND greater than 72 hours after symptoms resolution (absence of fever without the use of fever-reducing medication and improvement in respiratory symptoms), whichever is longer Get plenty of rest and push fluids Z-pak prescribed.  Take as directed and to  completion Use OTC medications like ibuprofen or tylenol as needed fever or pain Call or go to the ED if you have any new or worsening symptoms such as fever, worsening cough,  shortness of breath, chest tightness, chest pain, turning blue, changes in mental status, etc...    Reviewed expectations re: course of current medical issues. Questions answered. Outlined signs and symptoms indicating need for more acute intervention. Patient verbalized understanding. After Visit Summary given.         Lestine Box, PA-C 01/01/19 1507

## 2019-01-02 ENCOUNTER — Ambulatory Visit: Payer: Medicare Other | Admitting: Orthopedic Surgery

## 2019-01-04 ENCOUNTER — Ambulatory Visit: Payer: Medicare Other | Admitting: Gastroenterology

## 2019-01-05 LAB — NOVEL CORONAVIRUS, NAA: SARS-CoV-2, NAA: NOT DETECTED

## 2019-01-09 ENCOUNTER — Other Ambulatory Visit: Payer: Self-pay | Admitting: Gastroenterology

## 2019-01-09 MED ORDER — DEXILANT 60 MG PO CPDR: 60.0000 mg | DELAYED_RELEASE_CAPSULE | Freq: Every day | ORAL | 5 refills | Status: DC

## 2019-01-10 ENCOUNTER — Ambulatory Visit: Payer: Medicare Other | Admitting: Family Medicine

## 2019-01-10 ENCOUNTER — Encounter: Payer: Self-pay | Admitting: Family Medicine

## 2019-01-10 ENCOUNTER — Ambulatory Visit (INDEPENDENT_AMBULATORY_CARE_PROVIDER_SITE_OTHER): Payer: Medicare Other | Admitting: Physician Assistant

## 2019-01-10 ENCOUNTER — Encounter: Payer: Self-pay | Admitting: Physician Assistant

## 2019-01-10 ENCOUNTER — Other Ambulatory Visit: Payer: Self-pay

## 2019-01-10 ENCOUNTER — Ambulatory Visit (INDEPENDENT_AMBULATORY_CARE_PROVIDER_SITE_OTHER): Payer: Medicare Other | Admitting: Psychiatry

## 2019-01-10 VITALS — BP 115/80 | HR 87 | Temp 98.4°F | Ht 64.0 in | Wt 177.6 lb

## 2019-01-10 DIAGNOSIS — B370 Candidal stomatitis: Secondary | ICD-10-CM | POA: Diagnosis not present

## 2019-01-10 DIAGNOSIS — F3132 Bipolar disorder, current episode depressed, moderate: Secondary | ICD-10-CM

## 2019-01-10 DIAGNOSIS — S0340XA Sprain of jaw, unspecified side, initial encounter: Secondary | ICD-10-CM

## 2019-01-10 DIAGNOSIS — F411 Generalized anxiety disorder: Secondary | ICD-10-CM

## 2019-01-10 DIAGNOSIS — F509 Eating disorder, unspecified: Secondary | ICD-10-CM

## 2019-01-10 DIAGNOSIS — F429 Obsessive-compulsive disorder, unspecified: Secondary | ICD-10-CM

## 2019-01-10 DIAGNOSIS — R351 Nocturia: Secondary | ICD-10-CM

## 2019-01-10 DIAGNOSIS — G4700 Insomnia, unspecified: Secondary | ICD-10-CM

## 2019-01-10 HISTORY — DX: Sprain of jaw, unspecified side, initial encounter: S03.40XA

## 2019-01-10 MED ORDER — CYCLOBENZAPRINE HCL 10 MG PO TABS: ORAL_TABLET | ORAL | 0 refills | Status: DC

## 2019-01-10 MED ORDER — CLONAZEPAM 0.5 MG PO TABS: 0.5000 mg | ORAL_TABLET | Freq: Two times a day (BID) | ORAL | 5 refills | Status: DC | PRN

## 2019-01-10 MED ORDER — LATUDA 60 MG PO TABS: 60.0000 mg | ORAL_TABLET | Freq: Every day | ORAL | 1 refills | Status: DC

## 2019-01-10 MED ORDER — NYSTATIN 100000 UNIT/ML MT SUSP: OROMUCOSAL | 0 refills | Status: DC

## 2019-01-10 MED ORDER — TRAZODONE HCL 100 MG PO TABS: 100.0000 mg | ORAL_TABLET | Freq: Every evening | ORAL | 5 refills | Status: DC | PRN

## 2019-01-10 NOTE — Progress Notes (Signed)
[facility]  Patient ID: Jennifer [name],  MRN: [id number]  PCP: Maryruth Hancock, MD  Date of Evaluation: 01/10/2019 Time spent:25 minutes  Chief Complaint:  Chief Complaint    Insomnia; Anxiety; Depression; Follow-up     Virtual Visit via Telephone Note  I connected with patient by a video enabled telemedicine application or telephone, with their informed consent, and verified patient privacy and that I am speaking with the correct person using two identifiers.  I am private, in my office and the patient is home.   I discussed the limitations, risks, security and privacy concerns of performing an evaluation and management service by telephone and the availability of in person appointments. I also discussed with the patient that there may be a patient responsible charge related to this service. The patient expressed understanding and agreed to proceed.   I discussed the assessment and treatment plan with the patient. The patient was provided an opportunity to ask questions and all were answered. The patient agreed with the plan and demonstrated an understanding of the instructions.   The patient was advised to call back or seek an in-person evaluation if the symptoms worsen or if the condition fails to improve as anticipated.  I provided 25 minutes of non-face-to-face time during this encounter.  HISTORY/CURRENT STATUS: HPI For routine med check. Not doing well.  Manie is more depressed right now.  Her stepfather died since last Christmas so this is the first Christmas without him.  She is not going to be able to see her mother for Christmas due to COVID restrictions.  She is very upset about that.  States she is having suicidal thoughts but feels that she will not do anything because she is thinking about people that she would leave behind and she does not want to hurt them.  She is also sick physically with upper respiratory infection and earache.  She is going to see her  PCP this afternoon for that.    She also continues having GI symptoms.  She has not purged in several months now.  She still has bowel problems and has had internal hemorrhoids although that is better now.  One big problem is wetting herself in the bed.  She is even having to wear Depends at night just to make sure she does not wet the bed.  When she takes a higher dose of trazodone to help sleep, it is worse.  But if she does not take the trazodone, she is unable to sleep.  She does not sleep through the night, even with the trazodone but when she wakes up, she goes to the bathroom and then is able to go back to sleep.  If she only takes 50 mg of trazodone she does not sleep as well but does not tend to wet the bed.  She is not sure why she is doing medicine will discuss with her PCP.  "I need to sleep though so it is what it is.  If I went myself, I just do.  That is what the depends are for."  Patient denies increased energy with decreased need for sleep, no increased talkativeness, no racing thoughts, no impulsivity or risky behaviors, no increased spending, no increased libido, no grandiosity.  Review of Systems  Constitutional: Positive for malaise/fatigue.  HENT: Positive for congestion and ear pain.   Eyes: Negative.   Respiratory: Negative.   Cardiovascular: Negative.   Gastrointestinal: Positive for diarrhea and nausea.  Genitourinary: Negative.  Nocturia.  See HPI  Musculoskeletal: Negative.   Skin: Negative.   Neurological: Positive for headaches.  Endo/Heme/Allergies: Negative.   Psychiatric/Behavioral: Positive for depression. The patient is nervous/anxious and has insomnia.    Individual Medical History/ Review of Systems: Changes? :Yes  see above  Past medications for mental health diagnoses include: Risperdal, Seroquel, Prozac, Zoloft, Latuda, Wellbutrin, Lamictal, Depakote, Xanax, Ambien, trazodone, Trileptal, Luvox, Topamax, Elavil, Pamelor, BuSpar, doxazosin,  prazosin, lithium, Vraylar, [name]  Allergies: Codeine, Sonata [zaleplon], Meloxicam, Other, Topamax [topiramate], Emgality [galcanezumab-gnlm], and Penicillins  Current Medications:  Current Outpatient Medications:  .  acetaminophen (TYLENOL) 500 MG tablet, Take 1,000 mg by mouth every 6 (six) hours as needed for moderate pain or headache., Disp: , Rfl:  .  albuterol (VENTOLIN HFA) 108 (90 Base) MCG/ACT inhaler, Inhale 1-2 puffs into the lungs every 6 (six) hours as needed for wheezing or shortness of breath., Disp: 18 g, Rfl: 0 .  clonazePAM (KLONOPIN) 0.5 MG tablet, Take 1 tablet (0.5 mg total) by mouth 2 (two) times daily as needed for anxiety., Disp: 20 tablet, Rfl: 5 .  cycloSPORINE (RESTASIS) 0.05 % ophthalmic emulsion, Place 1 drop into both eyes 2 (two) times daily. , Disp: , Rfl:  .  dexlansoprazole (DEXILANT) 60 MG capsule, Take 1 capsule (60 mg total) by mouth daily before breakfast., Disp: 30 capsule, Rfl: 5 .  dicyclomine (BENTYL) 20 MG tablet, Take 1 tablet (20 mg total) by mouth 3 (three) times daily as needed for spasms (abdominal cramping)., Disp: 20 tablet, Rfl: 0 .  divalproex (DEPAKOTE ER) 500 MG 24 hr tablet, Take 1 tablet (500 mg total) by mouth daily., Disp: 30 tablet, Rfl: 5 .  famotidine (PEPCID) 20 MG tablet, Take 1 tablet (20 mg total) by mouth 2 (two) times daily. (Patient taking differently: Take 20 mg by mouth daily. ), Disp: 60 tablet, Rfl: 5 .  fluticasone (FLONASE) 50 MCG/ACT nasal spray, Place 1 spray into both nostrils daily as needed for allergies. , Disp: , Rfl:  .  fluvoxaMINE (LUVOX) 100 MG tablet, Take 3 po qd, Disp: 270 tablet, Rfl: 1 .  gabapentin (NEURONTIN) 600 MG tablet, Take 2 tablets (1,200 mg total) by mouth 2 (two) times daily., Disp: 120 tablet, Rfl: 5 .  hydrOXYzine (ATARAX/VISTARIL) 50 MG tablet, 1-2 po q6hr prn (Patient taking differently: Take 50-100 mg by mouth every 6 (six) hours as needed for anxiety. ), Disp: 90 tablet, Rfl: 5 .   hyoscyamine (LEVSIN) 0.125 MG tablet, Take 1 tablet (0.125 mg total) by mouth every 4 (four) hours as needed (diarrhea)., Disp: 120 tablet, Rfl: 0 .  olopatadine (PATANOL) 0.1 % ophthalmic solution, Place 1 drop into both eyes 2 (two) times daily. , Disp: , Rfl:  .  ondansetron (ZOFRAN ODT) 4 MG disintegrating tablet, Take 1 tablet (4 mg total) by mouth every 8 (eight) hours as needed for nausea or vomiting., Disp: 30 tablet, Rfl: 1 .  sucralfate (CARAFATE) 1 g tablet, Take 1 tablet by mouth 2 (two) times a day. Mostly once a day, Disp: , Rfl:  .  SUMAtriptan (IMITREX) 50 MG tablet, Take 1 tablet and May repeat in 2 hours if headache persists or recurs. (Do not exceed more then 2 per 24 hour period), Disp: 10 tablet, Rfl: 0 .  traZODone (DESYREL) 100 MG tablet, Take 1-2 tablets (100-200 mg total) by mouth at bedtime as needed for sleep., Disp: 60 tablet, Rfl: 5 .  valACYclovir (VALTREX) 500 MG tablet, Take 500 mg by mouth  daily as needed (outbreak). , Disp: , Rfl:  .  azithromycin (ZITHROMAX) 250 MG tablet, Take 1 tablet (250 mg total) by mouth daily. Take first 2 tablets together, then 1 every day until finished. (Patient not taking: Reported on 01/10/2019), Disp: 6 tablet, Rfl: 0 .  Lurasidone HCl (LATUDA) 60 MG TABS, Take 1 tablet (60 mg total) by mouth daily with supper., Disp: 30 tablet, Rfl: 1 .  Na Sulfate-K Sulfate-Mg Sulf (SUPREP BOWEL PREP KIT) 17.5-3.13-1.6 GM/177ML SOLN, Take 1 kit by mouth as directed. (Patient not taking: Reported on 01/10/2019), Disp: 354 mL, Rfl: 0 .  pantoprazole (PROTONIX) 40 MG tablet, Take 1 tablet (40 mg total) by mouth 2 (two) times daily before a meal. (Patient not taking: Reported on 01/10/2019), Disp: 90 tablet, Rfl: 3 Medication Side Effects: none  Family Medical/ Social History: Changes? No  MENTAL HEALTH EXAM:  Last menstrual period 10/07/2011.There is no height or weight on file to calculate BMI.  General Appearance: unable to assess  Eye Contact:   unable to assess  Speech:  Clear and Coherent, Pressured and Talkative  Volume:  Normal  Mood:  Depressed  Affect:  Depressed and Tearful  Thought Process:  Goal Directed and Descriptions of Associations: Intact  Orientation:  Full (Time, Place, and Person)  Thought Content: Logical   Suicidal Thoughts:  Yes.  without intent/plan  Homicidal Thoughts:  No  Memory:  WNL  Judgement:  Good  Insight:  Good  Psychomotor Activity:  unable to assess  Concentration:  Concentration: Good  Recall:  Good  Fund of Knowledge: Good  Language: Good  Assets:  Desire for Improvement  ADL's:  Intact  Cognition: WNL  Prognosis:  Good    DIAGNOSES:    ICD-10-CM   1. Bipolar affective disorder, currently depressed, moderate ([place])  F31.32   2. Obsessive-compulsive disorder, unspecified type  F42.9   3. Eating disorder, unspecified type  F50.9   4. Insomnia, unspecified type  G47.00   5. Generalized anxiety disorder  F41.1   6. Nocturia  R35.1     Receiving Psychotherapy: Yes  With Rinaldo Cloud, LCSW   RECOMMENDATIONS:  We discussed the suicidal thoughts.  Contract for safety is in place.  She knows to call 911, our office, the suicide hotline, or go to [place] emergency room if the suicidal thoughts become active. I recommend that she discuss the nocturia with her PCP. Increase Latuda to 60 mg p.o. daily with supper. Continue Klonopin 0.5 mg, 1/2-1 twice daily as needed.  She uses it sparingly. Continue Depakote ER 500 mg nightly. Continue Luvox 100 mg, 3 p.o. nightly. Continue gabapentin 600 mg, 2 p.o. twice daily. Continue hydroxyzine 50 mg, 1-2 every 6 hours as needed, or she can cut in half as needed. Continue trazodone 100 mg, 1-2 nightly as needed sleep. Continue therapy with Rinaldo Cloud, LCSW. Return in 4 weeks.   [name], PA-C

## 2019-01-10 NOTE — Patient Instructions (Signed)
ENT if not improvement with throat pain Trial of flexeril recommended Nystatin for thrush

## 2019-01-10 NOTE — Progress Notes (Signed)
[facility] Counselor/Therapist Progress Note  Patient ID: Jennifer [name], MRN: [id number],    Date: 01/10/2019  Time Spent: 45  minutes 11:05am to 11:50 pm  Virtual Visit with Video Note Connected with patient by a video enabled telemedicine/telehealth application or telephone, with their informed consent, and verified patient privacy and that I am speaking with the correct person using two identifiers. I discussed the limitations, risks, security and privacy concerns of performing psychotherapy and management service by telephone and the availability of in person appointments. I also discussed with the patient that there may be a patient responsible charge related to this service. The patient expressed understanding and agreed to proceed. I discussed the treatment planning with the patient. The patient was provided an opportunity to ask questions and all were answered. The patient agreed with the plan and demonstrated an understanding of the instructions. The patient was advised to call  our office if  symptoms worsen or feel they are in a crisis state and need immediate contact.   Therapist Location: [name] Patient Location: home  Treatment Type: Individual Therapy  Reported Symptoms:  Anxious, obsessive thoughts, depression,  Mental Status Exam:  Appearance:   Casual     Behavior:  Appropriate and Sharing  Motor:  Normal  Speech/Language:   started out more rapid and then calmed down to more of  normal pace  Affect:  Depressed and anxious  Mood:  anxious and depressed  Thought process:  goal directed  Thought content:    WNL  Sensory/Perceptual disturbances:    WNL  Orientation:  oriented to person, place, time/date, situation, day of week, month of year and year  Attention:  Good  Concentration:  Fair  Memory:  "some forgetfulness"  Fund of knowledge:   Good  Insight:    Fair  Judgment:   Good  Impulse Control:  Fair   Risk Assessment: Danger to  Self:  No Self-injurious Behavior: No Danger to Others: No Duty to Warn:no Physical Aggression / Violence:No  Access to Firearms a concern: No  Gang Involvement:No   Subjective: Patient very emotional today, not feeling well physically and has appt with her doctor today at 1:40pm. Obsessive negative thoughts and looking for what may go wrong verus right.  Had prior SI and is denying it now.  States "I would not really hurt myself as I know it would hurt my family.  Hoping the new year 2021 will be a better year.   Interventions: Cognitive Behavioral Therapy, Solution-Oriented/Positive Psychology and Ego-Supportive  Diagnosis:   ICD-10-CM   1. Bipolar affective disorder, currently depressed, moderate ([place])  F31.32     Plan: Treatment goals may remain the same for a while until met, however progress or lack of, will be noted each session in "Progress" sections.  Treatment Goals Patient is not signing tx goals on computer screen due COVID.  Long term goal: Develop the ability to recognize, accept, and cope with feelings of depression and anxiety successfully in positive and healthy ways.  Short term goal Learn and implement calming skills to reduce overall tension and moments of increased anxiety.  Strategies: 1)Practice calming strategies that she has used in therapy office on occasions with benefit. Remain open to learning new ones.(breathing, 5 senses, counting, pause button) 2)Take her medications as prescribed. 3) Will learn and practice CBT strategies relating to changing her anxious and depressed thoughts to more hopeful and empowering thoughts.  PROGRESS: Patient today initially very tearful and for  several minutes spoke of all the negative feelings and thoughts she was having.  I specifically asked about any SI and she stated that she was not having current SI and that the most recent time of having such thoughts was last Saturday.  States she took a Klonopin and that  helped, "always does".  States she does still have meds.  Also discussed help available to her in case she were to have SI and need immediate care outside of normal work hours---explained how she can access hospital if needed outside of regular hours.  I asked patient to do an exercise with me where she focused on positives in her life and she was able to do this for a short period and actually had some good things on a list she completed, and ended up taking a good part of our session but was very much needed today to help her re-group and feel more grounded.  Discussed the significance of each of the positives on her list and this seemed to help calm patient. Also reviewed her goals with her, emphasizing her strengths.  Goal review and progress noted with patient.   Next appt within 2 weeks.   Shanon Ace, LCSW

## 2019-01-11 ENCOUNTER — Ambulatory Visit: Payer: Medicare Other | Admitting: Family Medicine

## 2019-01-13 NOTE — Progress Notes (Signed)
 Acute Office Visit  Subjective:    Patient ID: Jennifer [name], female    DOB: 11/08/1965, 53 y.o.   MRN: [id number]  Chief Complaint  Patient presents with  . Otalgia  . Sore Throat    HPI Patient is in today for sore throat related to the right ear.pt states not ringing in the ear. No loss of hearing. Pt states right ear painful. No fever. No congestion. Pt completed the antibiotics given by UC for similar problem. Note reviewed. NO fever. Throat irritation with mucous. COVID negative Past Medical History:  Diagnosis Date  . Anxiety   . Binge-eating and purging type anorexia nervosa   . Bipolar disorder (HCC)   . Cystitis, interstitial   . Depression   . Esophageal polyp    about 20 years ago  . GERD (gastroesophageal reflux disease)   . OCD (obsessive compulsive disorder)     Past Surgical History:  Procedure Laterality Date  . BLADDER SURGERY    . CHOLECYSTECTOMY    . ESOPHAGOGASTRODUODENOSCOPY  09/28/2016   [facility]; Dr. Karki; erosions in the esophagus, 5 cm hiatal hernia, nonbleeding erosive gastropathy s/p biopsied, normal duodenum.  Path with chronic inactive gastritis, no H. pylori or intestinal metaplasia.   . ESOPHAGOGASTRODUODENOSCOPY (EGD) WITH PROPOFOL N/A 10/17/2018   Dr. Rourk: mild reflux esophagitis, small hiatal hernia  . VOCAL CORD LATERALIZATION, ENDOSCOPIC APPROACH W/ MLB      Family History  Problem Relation Age of Onset  . Healthy Mother   . Healthy Father   . Colon cancer Neg Hx   . Colon polyps Neg Hx     Social History   Socioeconomic History  . Marital status: Married    Spouse name: Not on file  . Number of children: Not on file  . Years of education: Not on file  . Highest education level: Not on file  Occupational History  . Not on file  Tobacco Use  . Smoking status: Current Every Day Smoker    Packs/day: 0.50    Years: 20.00    Pack years: 10.00    Types: Cigarettes    Start date: 01/20/1988  . Smokeless tobacco: Never  Used  Substance and Sexual Activity  . Alcohol use: No    Alcohol/week: 0.0 standard drinks  . Drug use: No  . Sexual activity: Yes    Birth control/protection: None  Other Topics Concern  . Not on file  Social History Narrative  . Not on file   Social Determinants of Health   Financial Resource Strain:   . Difficulty of Paying Living Expenses: Not on file  Food Insecurity:   . Worried About Running Out of Food in the Last Year: Not on file  . Ran Out of Food in the Last Year: Not on file  Transportation Needs:   . Lack of Transportation (Medical): Not on file  . Lack of Transportation (Non-Medical): Not on file  Physical Activity:   . Days of Exercise per Week: Not on file  . Minutes of Exercise per Session: Not on file  Stress:   . Feeling of Stress : Not on file  Social Connections:   . Frequency of Communication with Friends and Family: Not on file  . Frequency of Social Gatherings with Friends and Family: Not on file  . Attends Religious Services: Not on file  . Active Member of Clubs or Organizations: Not on file  . Attends Club or Organization Meetings: Not on file  .   Marital Status: Not on file  Intimate Partner Violence:   . Fear of Current or Ex-Partner: Not on file  . Emotionally Abused: Not on file  . Physically Abused: Not on file  . Sexually Abused: Not on file    Outpatient Medications Prior to Visit  Medication Sig Dispense Refill  . acetaminophen (TYLENOL) 500 MG tablet Take 1,000 mg by mouth every 6 (six) hours as needed for moderate pain or headache.    . albuterol (VENTOLIN HFA) 108 (90 Base) MCG/ACT inhaler Inhale 1-2 puffs into the lungs every 6 (six) hours as needed for wheezing or shortness of breath. 18 g 0  . azithromycin (ZITHROMAX) 250 MG tablet Take 1 tablet (250 mg total) by mouth daily. Take first 2 tablets together, then 1 every day until finished. (Patient not taking: Reported on 01/10/2019) 6 tablet 0  . clonazePAM (KLONOPIN) 0.5 MG  tablet Take 1 tablet (0.5 mg total) by mouth 2 (two) times daily as needed for anxiety. 20 tablet 5  . cycloSPORINE (RESTASIS) 0.05 % ophthalmic emulsion Place 1 drop into both eyes 2 (two) times daily.     Marland Kitchen dexlansoprazole (DEXILANT) 60 MG capsule Take 1 capsule (60 mg total) by mouth daily before breakfast. 30 capsule 5  . dicyclomine (BENTYL) 20 MG tablet Take 1 tablet (20 mg total) by mouth 3 (three) times daily as needed for spasms (abdominal cramping). 20 tablet 0  . divalproex (DEPAKOTE ER) 500 MG 24 hr tablet Take 1 tablet (500 mg total) by mouth daily. 30 tablet 5  . famotidine (PEPCID) 20 MG tablet Take 1 tablet (20 mg total) by mouth 2 (two) times daily. (Patient taking differently: Take 20 mg by mouth daily. ) 60 tablet 5  . fluticasone (FLONASE) 50 MCG/ACT nasal spray Place 1 spray into both nostrils daily as needed for allergies.     . fluvoxaMINE (LUVOX) 100 MG tablet Take 3 po qd 270 tablet 1  . gabapentin (NEURONTIN) 600 MG tablet Take 2 tablets (1,200 mg total) by mouth 2 (two) times daily. 120 tablet 5  . hydrOXYzine (ATARAX/VISTARIL) 50 MG tablet 1-2 po q6hr prn (Patient taking differently: Take 50-100 mg by mouth every 6 (six) hours as needed for anxiety. ) 90 tablet 5  . hyoscyamine (LEVSIN) 0.125 MG tablet Take 1 tablet (0.125 mg total) by mouth every 4 (four) hours as needed (diarrhea). 120 tablet 0  . Lurasidone HCl (LATUDA) 60 MG TABS Take 1 tablet (60 mg total) by mouth daily with supper. 30 tablet 1  . Na Sulfate-K Sulfate-Mg Sulf (SUPREP BOWEL PREP KIT) 17.5-3.13-1.6 GM/177ML SOLN Take 1 kit by mouth as directed. (Patient not taking: Reported on 01/10/2019) 354 mL 0  . olopatadine (PATANOL) 0.1 % ophthalmic solution Place 1 drop into both eyes 2 (two) times daily.     . ondansetron (ZOFRAN ODT) 4 MG disintegrating tablet Take 1 tablet (4 mg total) by mouth every 8 (eight) hours as needed for nausea or vomiting. 30 tablet 1  . pantoprazole (PROTONIX) 40 MG tablet Take 1  tablet (40 mg total) by mouth 2 (two) times daily before a meal. (Patient not taking: Reported on 01/10/2019) 90 tablet 3  . sucralfate (CARAFATE) 1 g tablet Take 1 tablet by mouth 2 (two) times a day. Mostly once a day    . SUMAtriptan (IMITREX) 50 MG tablet Take 1 tablet and May repeat in 2 hours if headache persists or recurs. (Do not exceed more then 2 per 24 hour period) 10  tablet 0  . traZODone (DESYREL) 100 MG tablet Take 1-2 tablets (100-200 mg total) by mouth at bedtime as needed for sleep. 60 tablet 5  . valACYclovir (VALTREX) 500 MG tablet Take 500 mg by mouth daily as needed (outbreak).      No facility-administered medications prior to visit.    Allergies  Allergen Reactions  . Codeine Hives and Shortness Of Breath  . Sonata [Zaleplon] Other (See Comments)    Hallucinations  . Meloxicam Other (See Comments)    Possible chest tightness - instructed by MD not to take  . Other Nausea Only    CODIENE-unable to enter under "Agent" for some reason  . Topamax [Topiramate] Other (See Comments)    Low BP and dizziness  . Emgality [Galcanezumab-Gnlm] Rash  . Penicillins Hives, Swelling and Rash    Has patient had a PCN reaction causing immediate rash, facial/tongue/throat swelling, SOB or lightheadedness with hypotension: yes Has patient had a PCN reaction causing severe rash involving mucus membranes or skin necrosis: no Has patient had a PCN reaction that required hospitalization: no Has patient had a PCN reaction occurring within the last 10 years: no If all of the above answers are "NO", then may proceed with Cephalosporin use.;     Review of Systems  HENT: Positive for congestion, ear pain and sore throat. Negative for ear discharge, facial swelling, hearing loss, sinus pressure, sinus pain, tinnitus and trouble swallowing.        Tongue irritation  Eyes: Negative.   Respiratory: Negative.   Cardiovascular: Negative.   Gastrointestinal: Negative.   Genitourinary:  Negative.   Musculoskeletal: Positive for arthralgias and back pain.       Objective:    Physical Exam Constitutional:      Appearance: Normal appearance.  HENT:     Head: Normocephalic and atraumatic.     Jaw: Tenderness present.     Salivary Glands: Right salivary gland is not diffusely enlarged or tender. Left salivary gland is not diffusely enlarged or tender.     Right Ear: Tympanic membrane and ear canal normal.     Left Ear: Tympanic membrane and ear canal normal.     Nose: Congestion present.  Cardiovascular:     Rate and Rhythm: Normal rate and regular rhythm.  Pulmonary:     Effort: Pulmonary effort is normal.     Breath sounds: Normal breath sounds.  Musculoskeletal:     Cervical back: Normal range of motion.  Lymphadenopathy:     Cervical: No cervical adenopathy.  Neurological:     Mental Status: She is alert and oriented to person, place, and time.  Psychiatric:        Behavior: Behavior is uncooperative.     BP 115/80 (BP Location: Right Arm, Patient Position: Sitting, Cuff Size: Normal)   Pulse 87   Temp 98.4 F (36.9 C) (Oral)   Ht 5' 4" (1.626 m)   Wt 177 lb 9.6 oz (80.6 kg)   LMP 10/07/2011 Comment: neg preg  SpO2 95%   BMI 30.48 kg/m  Wt Readings from Last 3 Encounters:  01/10/19 177 lb 9.6 oz (80.6 kg)  12/06/18 173 lb 3.2 oz (78.6 kg)  11/15/18 174 lb 3.2 oz (79 kg)    Health Maintenance Due  Topic Date Due  . HIV Screening  09/02/1980  . TETANUS/TDAP  09/02/1984  . MAMMOGRAM  09/03/2015  . COLONOSCOPY  09/03/2015  . PAP SMEAR-Modifier  05/23/2016   Lab Results  Component Value Date  TSH 1.392 12/06/2015   Lab Results  Component Value Date   WBC 7.5 10/27/2018   HGB 13.5 10/27/2018   HCT 41.1 10/27/2018   MCV 89.7 10/27/2018   PLT 201 10/27/2018   Lab Results  Component Value Date   NA 135 10/26/2018   K 3.6 10/26/2018   CO2 28 10/26/2018   GLUCOSE 101 (H) 10/26/2018   BUN 10 10/26/2018   CREATININE 0.82 10/26/2018    BILITOT 0.2 (L) 10/26/2018   ALKPHOS 77 10/26/2018   AST 19 10/26/2018   ALT 15 10/26/2018   PROT 7.3 10/26/2018   ALBUMIN 4.4 10/26/2018   CALCIUM 9.0 10/26/2018   ANIONGAP 8 10/26/2018   Lab Results  Component Value Date   CHOL 170 12/06/2015   Lab Results  Component Value Date   HDL 61 12/06/2015   Lab Results  Component Value Date   LDLCALC 90 12/06/2015   Lab Results  Component Value Date   TRIG 95 12/06/2015   Lab Results  Component Value Date   CHOLHDL 2.8 12/06/2015   Lab Results  Component Value Date   HGBA1C 5.4 12/06/2015       Assessment & Plan:   1. Sprain of temporomandibular joint, initial encounter Flexeril, avoid chewing as much as possible-focus on pudding, soup  2. Oral thrush Nystatin-rx Meds ordered this encounter  Medications  . nystatin (MYCOSTATIN) 100000 UNIT/ML suspension    Sig: 5ml swish and spit qid    Dispense:  60 mL    Refill:  0  . cyclobenzaprine (FLEXERIL) 10 MG tablet    Sig: 1/2 po at bedtime for TMJ    Dispense:  30 tablet    Refill:  0      LEIGH , MD 

## 2019-01-16 ENCOUNTER — Other Ambulatory Visit: Payer: Self-pay | Admitting: Gastroenterology

## 2019-01-16 MED ORDER — SUCRALFATE 1 G PO TABS: 1.0000 g | ORAL_TABLET | Freq: Three times a day (TID) | ORAL | 0 refills | Status: DC

## 2019-01-26 ENCOUNTER — Telehealth (INDEPENDENT_AMBULATORY_CARE_PROVIDER_SITE_OTHER): Payer: Medicare PPO | Admitting: Family Medicine

## 2019-01-26 ENCOUNTER — Ambulatory Visit (HOSPITAL_COMMUNITY)
Admission: RE | Admit: 2019-01-26 | Discharge: 2019-01-26 | Disposition: A | Discharge status: Home / Self Care | Payer: Medicare PPO | Source: Ambulatory Visit | Attending: Family Medicine | Admitting: Family Medicine

## 2019-01-26 ENCOUNTER — Other Ambulatory Visit: Payer: Self-pay

## 2019-01-26 ENCOUNTER — Encounter (HOSPITAL_COMMUNITY): Payer: Self-pay

## 2019-01-26 DIAGNOSIS — R0789 Other chest pain: Secondary | ICD-10-CM

## 2019-01-26 DIAGNOSIS — R0781 Pleurodynia: Secondary | ICD-10-CM

## 2019-01-26 MED ORDER — NICOTINE 21 MG/24HR TD PT24: 21.0000 mg | MEDICATED_PATCH | Freq: Every day | TRANSDERMAL | 0 refills | Status: DC

## 2019-01-26 NOTE — Progress Notes (Signed)
Virtual Visit via Telephone Note  I connected with REMINI NARCISO on 01/26/19 at 11:20 AM EST by telephone and verified that I am speaking with the correct person using two identifiers.  Location: Patient: home Provider: clinic   I discussed the limitations, risks, security and privacy concerns of performing an evaluation and management service by telephone and the availability of in person appointments. I also discussed with the patient that there may be a patient responsible charge related to this service. The patient expressed understanding and agreed to proceed.   History of Present Illness: Pt with pain noted with deep breaths-no injury. Not SOB.  No recent change in activity level. Pressure point on lateral chest wall. No rash noted.     Observations/Objective:  No rash Assessment and Plan:  1. Rib pain on right side - DG Chest 2 View; Future Follow Up Instructions: D/w pt aleve -avoid heavy lifting and repetitive motion. Stretches and Yoga suggested   I discussed the assessment and treatment plan with the patient. The patient was provided an opportunity to ask questions and all were answered. The patient agreed with the plan and demonstrated an understanding of the instructions.   The patient was advised to call back or seek an in-person evaluation if the symptoms worsen or if the condition fails to improve as anticipated.  I provided 7 minutes of non-face-to-face time during this encounter.   Emitt Maglione Hannah Beat, MD

## 2019-01-31 DIAGNOSIS — R0781 Pleurodynia: Secondary | ICD-10-CM | POA: Insufficient documentation

## 2019-01-31 HISTORY — DX: Pleurodynia: R07.81

## 2019-02-02 ENCOUNTER — Encounter: Payer: Self-pay | Admitting: Neurology

## 2019-02-02 ENCOUNTER — Encounter: Payer: Self-pay | Admitting: Family Medicine

## 2019-02-02 ENCOUNTER — Ambulatory Visit: Payer: Medicare PPO | Admitting: Psychiatry

## 2019-02-02 ENCOUNTER — Other Ambulatory Visit: Payer: Self-pay | Admitting: Neurology

## 2019-02-02 MED ORDER — ONDANSETRON 4 MG PO TBDP: 4.0000 mg | ORAL_TABLET | Freq: Three times a day (TID) | ORAL | 1 refills | Status: DC | PRN

## 2019-02-06 ENCOUNTER — Other Ambulatory Visit: Payer: Self-pay

## 2019-02-06 ENCOUNTER — Encounter: Payer: Self-pay | Admitting: Family Medicine

## 2019-02-06 ENCOUNTER — Ambulatory Visit: Payer: Medicare PPO | Admitting: Family Medicine

## 2019-02-06 VITALS — BP 108/73 | HR 69 | Temp 97.0°F | Ht 64.0 in | Wt 178.0 lb

## 2019-02-06 DIAGNOSIS — G43719 Chronic migraine without aura, intractable, without status migrainosus: Secondary | ICD-10-CM

## 2019-02-06 MED ORDER — SUMATRIPTAN SUCCINATE 50 MG PO TABS: ORAL_TABLET | ORAL | 5 refills | Status: DC

## 2019-02-06 MED ORDER — DIVALPROEX SODIUM ER 500 MG PO TB24: 500.0000 mg | ORAL_TABLET | Freq: Every day | ORAL | 11 refills | Status: DC

## 2019-02-06 NOTE — Patient Instructions (Addendum)
We will continue divalproex ER 500mg  daily  We will refill sumatriptan, may take 1 at onset of headache, may repeat 1 tablet in 2 hours but no more than 2 in 24 hours or 10 per month  May take Zofran and benadryl with sumatriptan for bad migraine but make sure yo do not have to drive. Rest when possible.   Make sure to drink 50-60 ounces of water daily  Follow up closely with PCP, psychiatry and GI.   Follow up with [place] in 1 year, sooner if needed   Migraine Headache A migraine headache is a very strong throbbing pain on one side or both sides of your head. This type of headache can also cause other symptoms. It can last from 4 hours to 3 days. Talk with your doctor about what things may bring on (trigger) this condition. What are the causes? The exact cause of this condition is not known. This condition may be triggered or caused by:  Drinking alcohol.  Smoking.  Taking medicines, such as: ? Medicine used to treat chest pain (nitroglycerin). ? Birth control pills. ? Estrogen. ? Some blood pressure medicines.  Eating or drinking certain products.  Doing physical activity. Other things that may trigger a migraine headache include:  Having a menstrual period.  Pregnancy.  Hunger.  Stress.  Not getting enough sleep or getting too much sleep.  Weather changes.  Tiredness (fatigue). What increases the risk?  Being 62-30 years old.  Being female.  Having a family history of migraine headaches.  Being Caucasian.  Having depression or anxiety.  Being very overweight. What are the signs or symptoms?  A throbbing pain. This pain may: ? Happen in any area of the head, such as on one side or both sides. ? Make it hard to do daily activities. ? Get worse with physical activity. ? Get worse around bright lights or loud noises.  Other symptoms may include: ? Feeling sick to your stomach (nauseous). ? Vomiting. ? Dizziness. ? Being sensitive to bright lights,  loud noises, or smells.  Before you get a migraine headache, you may get warning signs (an aura). An aura may include: ? Seeing flashing lights or having blind spots. ? Seeing bright spots, halos, or zigzag lines. ? Having tunnel vision or blurred vision. ? Having numbness or a tingling feeling. ? Having trouble talking. ? Having weak muscles.  Some people have symptoms after a migraine headache (postdromal phase), such as: ? Tiredness. ? Trouble thinking (concentrating). How is this treated?  Taking medicines that: ? Relieve pain. ? Relieve the feeling of being sick to your stomach. ? Prevent migraine headaches.  Treatment may also include: ? Having acupuncture. ? Avoiding foods that bring on migraine headaches. ? Learning ways to control your body functions (biofeedback). ? Therapy to help you know and deal with negative thoughts (cognitive behavioral therapy). Follow these instructions at home: Medicines  Take over-the-counter and prescription medicines only as told by your doctor.  Ask your doctor if the medicine prescribed to you: ? Requires you to avoid driving or using heavy machinery. ? Can cause trouble pooping (constipation). You may need to take these steps to prevent or treat trouble pooping:  Drink enough fluid to keep your pee (urine) pale yellow.  Take over-the-counter or prescription medicines.  Eat foods that are high in fiber. These include beans, whole grains, and fresh fruits and vegetables.  Limit foods that are high in fat and sugar. These include fried or sweet foods.  Lifestyle  Do not drink alcohol.  Do not use any products that contain nicotine or tobacco, such as cigarettes, e-cigarettes, and chewing tobacco. If you need help quitting, ask your doctor.  Get at least 8 hours of sleep every night.  Limit and deal with stress. General instructions      Keep a journal to find out what may bring on your migraine headaches. For example, write  down: ? What you eat and drink. ? How much sleep you get. ? Any change in what you eat or drink. ? Any change in your medicines.  If you have a migraine headache: ? Avoid things that make your symptoms worse, such as bright lights. ? It may help to lie down in a dark, quiet room. ? Do not drive or use heavy machinery. ? Ask your doctor what activities are safe for you.  Keep all follow-up visits as told by your doctor. This is important. Contact a doctor if:  You get a migraine headache that is different or worse than others you have had.  You have more than 15 headache days in one month. Get help right away if:  Your migraine headache gets very bad.  Your migraine headache lasts longer than 72 hours.  You have a fever.  You have a stiff neck.  You have trouble seeing.  Your muscles feel weak or like you cannot control them.  You start to lose your balance a lot.  You start to have trouble walking.  You pass out (faint).  You have a seizure. Summary  A migraine headache is a very strong throbbing pain on one side or both sides of your head. These headaches can also cause other symptoms.  This condition may be treated with medicines and changes to your lifestyle.  Keep a journal to find out what may bring on your migraine headaches.  Contact a doctor if you get a migraine headache that is different or worse than others you have had.  Contact your doctor if you have more than 15 headache days in a month. This information is not intended to replace advice given to you by your health care provider. Make sure you discuss any questions you have with your health care provider. Document Revised: 04/29/2018 Document Reviewed: 02/17/2018 Elsevier Patient Education  [place].

## 2019-02-06 NOTE — Progress Notes (Addendum)
PATIENT: Jennifer [name] DOB: 09/20/1965  REASON FOR VISIT: follow up HISTORY FROM: patient  Chief Complaint  Patient presents with  . Follow-up    4 mon f/u. Alone. Rm 1. Patient mentioned that she had headaches every day this week. She also mentioned that when she looks to the side that her eyes hurt. she also mentioned that her neck hurts and she hasn't been sleeping.      HISTORY OF PRESENT ILLNESS: Today 02/06/19 Jennifer [name] is a 54 y.o. female here today for follow up for headaches. She was started on divalproex ER 520m at bedtime in 09/2018. She reports that headaches did improve for a few months. Over the past month or so, she has noticed more headaches. She recently started nicotine patches for smoking cessation. She feels this may be correlating. She is also having neck pain and not sleeping well. She has a migraine today, pounding pain with light sensitivity. She usually takes Imitrex that does help but she has been out of medication.  She is staying well-hydrated.  She states that stress is definitely contributing to her symptoms.  She is followed closely by primary care and psychiatry.   HISTORY: (copied from Dr Dohmeier's note on 10/04/2018)  HPI:  Jennifer BOLLMANis a 54y.o. female  Is seen here as a referral/ revisit  from Dr. CHolly Bodilyfor a headache that she has identified as migraines.   Her headaches are either in the temporal skull, sometimes in the right ( most often 0 sometimes in the left/. But she has sometimes neck pain, too. Migraine can last 3 days, photophobia bothers her, sounds, too. She has nausea.  She used Maxalt with less and less effect, after years of good control. She is allergic to Topamax, she believes it caused her to have HTN. Propanolol was tried, and affected her memory. Depakote was tried with psychiatry-  She is not sure why it was discontinued, may be it interfered with Latuda. She is afraid of weight gain.  [facility] and had given herself one more dose in July .and developed hives, whelps and joint pain. [facility]  She felt " forced " to take the shots, concerned about effect on her depression- and the psychiatric medications.  She wants to resume her sucessfull migraine treatment of the past: Toradol, steroids injections " into her hip".She has to be careful with steroids due to "ulcers in her stomach".      For the last 2 month she has been sleeping better after a stressful summer.  She has an eating disorder , has been hospitalized  6 times for bulemia, but reports 6 days without binging and purging. New therapist since she is on medicare.    REVIEW OF SYSTEMS: Out of a complete 14 system review of symptoms, the patient complains only of the following symptoms, headaches, numbness and all other reviewed systems are negative.  ALLERGIES: Allergies  Allergen Reactions  . Codeine Hives and Shortness Of Breath  . Sonata [Zaleplon] Other (See Comments)    Hallucinations  . Meloxicam Other (See Comments)    Possible chest tightness - instructed by MD not to take  . Other Nausea Only    CODIENE-unable to enter under "Agent" for some reason  . Topamax [Topiramate] Other (See Comments)    Low BP and dizziness  . Emgality [Galcanezumab-Gnlm] Rash  .  Penicillins Hives, Swelling and Rash    Has patient had a PCN reaction causing immediate rash, facial/tongue/throat swelling, SOB or lightheadedness with hypotension: yes Has patient had a PCN reaction causing severe rash involving mucus membranes or skin necrosis: no Has patient had a PCN reaction that required hospitalization: no Has patient had a PCN reaction occurring within the last 10 years: no If all of the above answers are "NO", then may proceed with Cephalosporin use.;     HOME MEDICATIONS: Outpatient Medications Prior to Visit  Medication Sig Dispense Refill  .  acetaminophen (TYLENOL) 500 MG tablet Take 1,000 mg by mouth every 6 (six) hours as needed for moderate pain or headache.    . albuterol (VENTOLIN HFA) 108 (90 Base) MCG/ACT inhaler Inhale 1-2 puffs into the lungs every 6 (six) hours as needed for wheezing or shortness of breath. 18 g 0  . clonazePAM (KLONOPIN) 0.5 MG tablet Take 1 tablet (0.5 mg total) by mouth 2 (two) times daily as needed for anxiety. 20 tablet 5  . cyclobenzaprine (FLEXERIL) 10 MG tablet 1/2 po at bedtime for TMJ 30 tablet 0  . cycloSPORINE (RESTASIS) 0.05 % ophthalmic emulsion Place 1 drop into both eyes 2 (two) times daily.     Marland Kitchen dexlansoprazole (DEXILANT) 60 MG capsule Take 1 capsule (60 mg total) by mouth daily before breakfast. 30 capsule 5  . dicyclomine (BENTYL) 20 MG tablet Take 1 tablet (20 mg total) by mouth 3 (three) times daily as needed for spasms (abdominal cramping). 20 tablet 0  . famotidine (PEPCID) 20 MG tablet Take 1 tablet (20 mg total) by mouth 2 (two) times daily. (Patient taking differently: Take 20 mg by mouth daily. ) 60 tablet 5  . fluticasone (FLONASE) 50 MCG/ACT nasal spray Place 1 spray into both nostrils daily as needed for allergies.     . fluvoxaMINE (LUVOX) 100 MG tablet Take 3 po qd 270 tablet 1  . gabapentin (NEURONTIN) 600 MG tablet Take 2 tablets (1,200 mg total) by mouth 2 (two) times daily. 120 tablet 5  . hydrOXYzine (ATARAX/VISTARIL) 50 MG tablet 1-2 po q6hr prn (Patient taking differently: Take 50-100 mg by mouth every 6 (six) hours as needed for anxiety. ) 90 tablet 5  . hyoscyamine (LEVSIN) 0.125 MG tablet Take 1 tablet (0.125 mg total) by mouth every 4 (four) hours as needed (diarrhea). 120 tablet 0  . Lurasidone HCl (LATUDA) 60 MG TABS Take 1 tablet (60 mg total) by mouth daily with supper. 30 tablet 1  . nicotine (NICODERM CQ) 21 mg/24hr patch Place 1 patch (21 mg total) onto the skin daily. 28 patch 0  . nystatin (MYCOSTATIN) 100000 UNIT/ML suspension 65m swish and spit qid 60 mL 0    . olopatadine (PATANOL) 0.1 % ophthalmic solution Place 1 drop into both eyes 2 (two) times daily.     . ondansetron (ZOFRAN ODT) 4 MG disintegrating tablet Take 1 tablet (4 mg total) by mouth every 8 (eight) hours as needed for nausea or vomiting. 30 tablet 1  . traZODone (DESYREL) 100 MG tablet Take 1-2 tablets (100-200 mg total) by mouth at bedtime as needed for sleep. 60 tablet 5  . valACYclovir (VALTREX) 500 MG tablet Take 500 mg by mouth daily as needed (outbreak).     .Marland Kitchendivalproex (DEPAKOTE ER) 500 MG 24 hr tablet Take 1 tablet (500 mg total) by mouth daily. 30 tablet 5  . SUMAtriptan (IMITREX) 50 MG tablet Take 1 tablet and May repeat in 2  hours if headache persists or recurs. (Do not exceed more then 2 per 24 hour period) 10 tablet 0  . Na Sulfate-K Sulfate-Mg Sulf (SUPREP BOWEL PREP KIT) 17.5-3.13-1.6 GM/177ML SOLN Take 1 kit by mouth as directed. (Patient not taking: Reported on 01/10/2019) 354 mL 0  . sucralfate (CARAFATE) 1 g tablet Take 1 tablet (1 g total) by mouth 4 (four) times daily -  with meals and at bedtime for 14 days. Mostly once a day 56 tablet 0  . azithromycin (ZITHROMAX) 250 MG tablet Take 1 tablet (250 mg total) by mouth daily. Take first 2 tablets together, then 1 every day until finished. (Patient not taking: Reported on 01/10/2019) 6 tablet 0  . pantoprazole (PROTONIX) 40 MG tablet Take 1 tablet (40 mg total) by mouth 2 (two) times daily before a meal. (Patient not taking: Reported on 01/10/2019) 90 tablet 3   No facility-administered medications prior to visit.    PAST MEDICAL HISTORY: Past Medical History:  Diagnosis Date  . Anxiety   . Binge-eating and purging type anorexia nervosa   . Bipolar disorder ([place])   . Cystitis, interstitial   . Depression   . Esophageal polyp    about 20 years ago  . GERD (gastroesophageal reflux disease)   . OCD (obsessive compulsive disorder)     PAST SURGICAL HISTORY: Past Surgical History:  Procedure Laterality Date   . BLADDER SURGERY    . CHOLECYSTECTOMY    . ESOPHAGOGASTRODUODENOSCOPY  09/28/2016   [facility]; Dr. Therisa Doyne; erosions in the esophagus, 5 cm hiatal hernia, nonbleeding erosive gastropathy s/p biopsied, normal duodenum.  Path with chronic inactive gastritis, no H. pylori or intestinal metaplasia.   Marland Kitchen ESOPHAGOGASTRODUODENOSCOPY (EGD) WITH PROPOFOL N/A 10/17/2018   Dr. Gala Romney: mild reflux esophagitis, small hiatal hernia  . VOCAL CORD LATERALIZATION, ENDOSCOPIC APPROACH W/ MLB      FAMILY HISTORY: Family History  Problem Relation Age of Onset  . Healthy Mother   . Healthy Father   . Colon cancer Neg Hx   . Colon polyps Neg Hx     SOCIAL HISTORY: Social History   Socioeconomic History  . Marital status: Married    Spouse name: Not on file  . Number of children: Not on file  . Years of education: Not on file  . Highest education level: Not on file  Occupational History  . Not on file  Tobacco Use  . Smoking status: Current Every Day Smoker    Packs/day: 0.50    Years: 20.00    Pack years: 10.00    Types: Cigarettes    Start date: 01/20/1988  . Smokeless tobacco: Never Used  Substance and Sexual Activity  . Alcohol use: No    Alcohol/week: 0.0 standard drinks  . Drug use: No  . Sexual activity: Yes    Birth control/protection: None  Other Topics Concern  . Not on file  Social History Narrative  . Not on file   Social Determinants of Health   Financial Resource Strain:   . Difficulty of Paying Living Expenses: Not on file  Food Insecurity:   . Worried About Charity fundraiser in the Last Year: Not on file  . Ran Out of Food in the Last Year: Not on file  Transportation Needs:   . Lack of Transportation (Medical): Not on file  . Lack of Transportation (Non-Medical): Not on file  Physical Activity:   . Days of Exercise per Week: Not on file  . Minutes of Exercise  per Session: Not on file  Stress:   . Feeling of Stress : Not on file  Social Connections:   . Frequency  of Communication with Friends and Family: Not on file  . Frequency of Social Gatherings with Friends and Family: Not on file  . Attends Religious Services: Not on file  . Active Member of Clubs or Organizations: Not on file  . Attends Archivist Meetings: Not on file  . Marital Status: Not on file  Intimate Partner Violence:   . Fear of Current or Ex-Partner: Not on file  . Emotionally Abused: Not on file  . Physically Abused: Not on file  . Sexually Abused: Not on file      PHYSICAL EXAM  Vitals:   02/06/19 1343  BP: 108/73  Pulse: 69  Temp: (!) 97 F (36.1 C)  TempSrc: Oral  Weight: 178 lb (80.7 kg)  Height: 5' 4"  (1.626 m)   Body mass index is 30.55 kg/m.  Generalized: Well developed, in no acute distress, she is wearing her sunglasses in the office today due to reported photophobia Cardiology: normal rate and rhythm, no murmur noted Respiratory: Clear to auscultation bilaterally Neurological examination  Mentation: Alert oriented to time, place, history taking. Follows all commands speech and language fluent Cranial nerve II-XII: Pupils were equal round reactive to light. Extraocular movements were full, visual field were full on confrontational test. Facial sensation and strength were normal.  Motor: The motor testing reveals 5 over 5 strength of all 4 extremities. Good symmetric motor tone is noted throughout.  Sensory: Sensory testing is intact to soft touch on all 4 extremities. No evidence of extinction is noted.  Gait and station: Gait is normal.    DIAGNOSTIC DATA (LABS, IMAGING, TESTING) - I reviewed patient records, labs, notes, testing and imaging myself where available.  No flowsheet data found.   Lab Results  Component Value Date   WBC 7.5 10/27/2018   HGB 13.5 10/27/2018   HCT 41.1 10/27/2018   MCV 89.7 10/27/2018   PLT 201 10/27/2018      Component Value Date/Time   NA 135 10/26/2018 2231   NA 139 10/04/2018 1504   K 3.6 10/26/2018  2231   CL 99 10/26/2018 2231   CO2 28 10/26/2018 2231   GLUCOSE 101 (H) 10/26/2018 2231   BUN 10 10/26/2018 2231   BUN 12 10/04/2018 1504   CREATININE 0.82 10/26/2018 2231   CREATININE 0.76 12/06/2017 1142   CALCIUM 9.0 10/26/2018 2231   PROT 7.3 10/26/2018 2231   PROT 7.2 10/04/2018 1504   ALBUMIN 4.4 10/26/2018 2231   ALBUMIN 4.8 10/04/2018 1504   AST 19 10/26/2018 2231   ALT 15 10/26/2018 2231   ALKPHOS 77 10/26/2018 2231   BILITOT 0.2 (L) 10/26/2018 2231   BILITOT 0.2 10/04/2018 1504   GFRNONAA >60 10/26/2018 2231   GFRNONAA 90 12/06/2017 1142   GFRAA >60 10/26/2018 2231   GFRAA 105 12/06/2017 1142   Lab Results  Component Value Date   CHOL 170 12/06/2015   HDL 61 12/06/2015   LDLCALC 90 12/06/2015   TRIG 95 12/06/2015   CHOLHDL 2.8 12/06/2015   Lab Results  Component Value Date   HGBA1C 5.4 12/06/2015   No results found for: [id number] Lab Results  Component Value Date   TSH 1.392 12/06/2015       ASSESSMENT AND PLAN 54 y.o. year old female  has a past medical history of Anxiety, Binge-eating and purging type anorexia nervosa,  Bipolar disorder ([place]), Cystitis, interstitial, Depression, Esophageal polyp, GERD (gastroesophageal reflux disease), and OCD (obsessive compulsive disorder). here with     ICD-10-CM   1. Intractable chronic migraine without aura and without status migrainosus  G43.719 CMP    SUMAtriptan (IMITREX) 50 MG tablet    He reports doing well on divalproex 500 mg at bedtime.  Headaches have improved significantly.  Unfortunately, she has experienced some increased stress, neck pain and insomnia over the past few weeks.  She has also recently attempted smoking cessation.  She is currently using nicotine patches.  She feels that all of these factors have contributed to the migraine she has today.  We will continue divalproex as prescribed.  I will update labs today to assess liver functioning.  She has been out of Imitrex.  I will refill that for  her today.  She was also advised that she may use Zofran and Benadryl as needed for abortive therapy.  She was advised on appropriate administration of these medications.  She was also advised against regular use of abortive medications due to worries of rebound headaches.  She will stay adequately hydrated.  Well-balanced diet and regular exercise advised.  If patient continues to do well on divalproex, she may follow-up with me in 1 year.  May follow-up sooner as needed.  She verbalizes understanding and agreement with this plan.   Orders Placed This Encounter  Procedures  . CMP     Meds ordered this encounter  Medications  . SUMAtriptan (IMITREX) 50 MG tablet    Sig: Take 1 tablet and May repeat in 2 hours if headache persists or recurs. (Do not exceed more then 2 per 24 hour period)    Dispense:  10 tablet    Refill:  5    Order Specific Question:   Supervising Provider    Answer:   Melvenia Beam [id number]  . divalproex (DEPAKOTE ER) 500 MG 24 hr tablet    Sig: Take 1 tablet (500 mg total) by mouth daily.    Dispense:  30 tablet    Refill:  11    Order Specific Question:   Supervising Provider    Answer:   Melvenia Beam [id number]      I spent 15 minutes with the patient. 50% of this time was spent counseling and educating patient on plan of care and medications.    Debbora Presto, FNP-C 02/06/2019, 3:22 PM [facility] [address], [place], [place] [address] [phone]

## 2019-02-07 LAB — COMPREHENSIVE METABOLIC PANEL
ALT: 17 IU/L (ref 0–32)
AST: 20 IU/L (ref 0–40)
Albumin/Globulin Ratio: 2.1 (ref 1.2–2.2)
Albumin: 4.6 g/dL (ref 3.8–4.9)
Alkaline Phosphatase: 92 IU/L (ref 39–117)
BUN/Creatinine Ratio: 17 (ref 9–23)
BUN: 14 mg/dL (ref 6–24)
Bilirubin Total: 0.3 mg/dL (ref 0.0–1.2)
CO2: 28 mmol/L (ref 20–29)
Calcium: 9.6 mg/dL (ref 8.7–10.2)
Chloride: 96 mmol/L (ref 96–106)
Creatinine, Ser: 0.84 mg/dL (ref 0.57–1.00)
GFR calc Af Amer: 92 mL/min/{1.73_m2} (ref >59)
GFR calc non Af Amer: 80 mL/min/{1.73_m2} (ref >59)
Globulin, Total: 2.2 g/dL (ref 1.5–4.5)
Glucose: 89 mg/dL (ref 65–99)
Potassium: 4.7 mmol/L (ref 3.5–5.2)
Sodium: 135 mmol/L (ref 134–144)
Total Protein: 6.8 g/dL (ref 6.0–8.5)

## 2019-02-08 ENCOUNTER — Ambulatory Visit (INDEPENDENT_AMBULATORY_CARE_PROVIDER_SITE_OTHER): Payer: Medicare PPO | Admitting: Psychiatry

## 2019-02-08 ENCOUNTER — Encounter: Payer: Self-pay | Admitting: Neurology

## 2019-02-08 DIAGNOSIS — F3132 Bipolar disorder, current episode depressed, moderate: Secondary | ICD-10-CM

## 2019-02-08 NOTE — Progress Notes (Signed)
[facility] Counselor/Therapist Progress Note  Patient ID: Jennifer [name], MRN: [id number],    Date: 02/08/2019  Time Spent: 60 minutes 9:00am to 10:00am   Treatment Type: Individual Therapy  Reported Symptoms:  Anxiety, depression, easily frustrated, communication difficulties  Mental Status Exam:  Appearance:   Casual     Behavior:  Appropriate and Sharing  Motor:  Normal  Speech/Language:   Clear and coherent, talking almost non-stop but not "manic"  Affect:  anxious, depressed  Mood:  anxious, depressed  Thought process:  goal directed  Thought content:    WNL  Sensory/Perceptual disturbances:    WNL  Orientation:  oriented to person, place, time/date, situation, day of week, month of year and year  Attention:  Fair  Concentration:  Fair  Memory:  some forgetfulness  Fund of knowledge:   Good  Insight:    Good  Judgment:   Good  Impulse Control:  Fair   Risk Assessment: Danger to Self:  No Self-injurious Behavior: No Danger to Others: No Duty to Warn:no Physical Aggression / Violence:No  Access to Firearms a concern: No  Gang Involvement:No   Subjective: Patient reports anxiety and depression.  Stressed personally and with all that is going on in "the news with violence and political issues."  States she is not watching as much TV news and that  Helps.   Interventions: Cognitive Behavioral Therapy and Solution-Oriented/Positive Psychology  Diagnosis:   ICD-10-CM   1. Bipolar affective disorder, currently depressed, moderate ([place])  F31.32     Plan: Treatment goals may remain the same for a while until met, however progress or lack of, will be noted each session in "Progress" sections.  Treatment Goals Patient is not signing tx goals on computer screen due COVID.  Long term goal: Develop the ability to recognize, accept, and cope with feelings of depression and anxiety successfully in positive and healthy ways.  Short term goal Learn and  implement calming skills to reduce overall tension and moments of increased anxiety.  Strategies: 1)Practice calming strategies that she has used in therapy office on occasions with benefit. Remain open to learning new ones.(breathing, 5 senses, counting, pause button) 2)Take her medications as prescribed. 3) Will learn and practice CBT strategies relating to changing her anxious and depressed thoughts to more hopeful and empowering thoughts.  PROGRESS: Patient continues goal-directed conversations and behaviors in some situations.  "Not major improvement but I'm trying."  Is recognizing some of her negative/anxious thoughts that lead her to feel more anxious, negative, or sometimes depressed.  Noted with patient that it is progress for her to even recognize "small or a few positives".  Patient in conversation often talking over others and does not really hear what they say, and this happens in therapy as well.  Working on this a bit with patient however it is difficult for her to recognize this tendency or realize the effect it has on communication.  Still frustrated easily when she finds changing and strategies difficult. Reports continuing to use calming strategies of deep breathing and yoga, "but has slacked off some and need to get back on it."  Talked about prior work on changing negative/anxious thoughts, intercepting them, and then replacing them with more positive, realistic, and empowering thoughts.  Also processed some ideas and thoughts from [facility]' Feeling Good Handbook (CBT) which could be helpful to patient.  Suggested assignment to patient in using that book. Goal review and progress noted with patient.  Next appt  within 2 weeks.  Shanon Ace, LCSW

## 2019-02-09 ENCOUNTER — Encounter: Payer: Self-pay | Admitting: Neurology

## 2019-02-09 ENCOUNTER — Ambulatory Visit (INDEPENDENT_AMBULATORY_CARE_PROVIDER_SITE_OTHER): Payer: Medicare PPO | Admitting: *Deleted

## 2019-02-09 ENCOUNTER — Telehealth: Payer: Self-pay

## 2019-02-09 ENCOUNTER — Other Ambulatory Visit: Payer: Self-pay

## 2019-02-09 DIAGNOSIS — G43719 Chronic migraine without aura, intractable, without status migrainosus: Secondary | ICD-10-CM

## 2019-02-09 MED ORDER — KETOROLAC TROMETHAMINE 60 MG/2ML IM SOLN
30.0000 mg | Freq: Once | INTRAMUSCULAR | Status: AC
  Administered 2019-02-09: 30 mg via INTRAMUSCULAR

## 2019-02-09 NOTE — Telephone Encounter (Signed)
Called pt to go over recent labs results per NP Amy Lomax. Pt demonstrated understanding and had no questions.  "Labs are normal" -NP AL

## 2019-02-09 NOTE — Telephone Encounter (Signed)
Patient called to request an appointment for the Toradol shot mentioned in mychart messages and would like to know what time she can come in. Please follow up

## 2019-02-09 NOTE — Telephone Encounter (Signed)
Pt here for toradol injection.  Some confusion about time and her coming in.  We were able to provide her care, after speaking to Debbora Presto, NP for toradol 30mg  IM for migraine.  Pt had not allergies to toradol.  See nurse visit note.

## 2019-02-09 NOTE — Progress Notes (Signed)
Pt here for Toradol injection.  Under aseptic technique  Toradol 30mg /79ml IM given L deltoid. Tolerated well.  Bandaid applied. She was crying stating pain comes and goes.  She will call back as needed.

## 2019-02-09 NOTE — Telephone Encounter (Signed)
Please advise  Called and spoke with pt to go over recent lab results and informed me that she would like to be scheduled as a Nurse visit to get a Toradol shot.

## 2019-02-10 ENCOUNTER — Ambulatory Visit: Payer: Medicare Other | Admitting: Gastroenterology

## 2019-02-13 ENCOUNTER — Encounter: Payer: Self-pay | Admitting: Family Medicine

## 2019-02-13 ENCOUNTER — Telehealth: Payer: Self-pay | Admitting: Family Medicine

## 2019-02-13 MED ORDER — NURTEC 75 MG PO TBDP: 75.0000 mg | ORAL_TABLET | Freq: Every day | ORAL | 11 refills | Status: DC | PRN

## 2019-02-13 NOTE — Addendum Note (Signed)
Addended by: Debbora Presto L on: 02/13/2019 04:31 PM   Modules accepted: Orders

## 2019-02-13 NOTE — Telephone Encounter (Signed)
Patient is calling and states she would like Dr. Holly Bodily to look at her medication list and see if it is okay if she takes migraine excedrin. [phone]

## 2019-02-14 NOTE — Telephone Encounter (Signed)
Patient is aware of the recommendation 

## 2019-02-14 NOTE — Telephone Encounter (Signed)
Routing to Dr. Corum for advice ? 

## 2019-02-14 NOTE — Telephone Encounter (Signed)
Excedrin migraine contains acetaminophen(tylenol) Asprin and caffeine. The caffeine can cause stimulation which can worsen anxiety. The asprin can aggravate your stomach or cause an ulcer.  Always take with food. You should never take this drug in pregnancy.  The medication if taken on a regular basis can cause rebound headaches.

## 2019-02-15 ENCOUNTER — Ambulatory Visit: Payer: Medicare Other | Admitting: Physician Assistant

## 2019-02-16 ENCOUNTER — Encounter: Payer: Self-pay | Admitting: Physician Assistant

## 2019-02-16 ENCOUNTER — Ambulatory Visit (INDEPENDENT_AMBULATORY_CARE_PROVIDER_SITE_OTHER): Payer: Medicare PPO | Admitting: Psychiatry

## 2019-02-16 ENCOUNTER — Ambulatory Visit (INDEPENDENT_AMBULATORY_CARE_PROVIDER_SITE_OTHER): Payer: Medicare PPO | Admitting: Physician Assistant

## 2019-02-16 DIAGNOSIS — F3132 Bipolar disorder, current episode depressed, moderate: Secondary | ICD-10-CM

## 2019-02-16 DIAGNOSIS — G4700 Insomnia, unspecified: Secondary | ICD-10-CM

## 2019-02-16 DIAGNOSIS — F422 Mixed obsessional thoughts and acts: Secondary | ICD-10-CM

## 2019-02-16 DIAGNOSIS — F411 Generalized anxiety disorder: Secondary | ICD-10-CM | POA: Diagnosis not present

## 2019-02-16 MED ORDER — LURASIDONE HCL 80 MG PO TABS: 80.0000 mg | ORAL_TABLET | Freq: Every day | ORAL | 1 refills | Status: DC

## 2019-02-16 NOTE — Progress Notes (Signed)
[facility]  Patient ID: Jennifer [name],  MRN: [id number]  PCP: Maryruth Hancock, MD  Date of Evaluation: 02/16/2019 Time spent:20 minutes  Chief Complaint:  Chief Complaint    Anxiety; Depression; Insomnia; Follow-up     Virtual Visit via Telephone Note  I connected with patient by a video enabled telemedicine application or telephone, with their informed consent, and verified patient privacy and that I am speaking with the correct person using two identifiers.  I am private, in my office and the patient is home.  I discussed the limitations, risks, security and privacy concerns of performing an evaluation and management service by telephone and the availability of in person appointments. I also discussed with the patient that there may be a patient responsible charge related to this service. The patient expressed understanding and agreed to proceed.   I discussed the assessment and treatment plan with the patient. The patient was provided an opportunity to ask questions and all were answered. The patient agreed with the plan and demonstrated an understanding of the instructions.   The patient was advised to call back or seek an in-person evaluation if the symptoms worsen or if the condition fails to improve as anticipated.  I provided 20 minutes of non-face-to-face time during this encounter.  HISTORY/CURRENT STATUS: HPI For 6 week med check.  OCD is acting up. Has ruminating thoughts that she has to do things a certain way or someone is going to die.  Checks things over and over to make sure they're locked, or closed or turned off or whatever. Unsure why it's acting up now.  At the last visit, we increased the [place].  She states it made her feel better for a couple of weeks but now she is starting to get down again.  She might feel good 1 day and then have mood swings and feel bad the next day.  She denies suicidal or homicidal thoughts but she does not want to be here  sometimes.  No access to firearms.  Energy and motivation are fair.  She does enjoy things like reading and sewing.  Patient denies increased energy with decreased need for sleep, no increased talkativeness, no racing thoughts, no impulsivity or risky behaviors, no increased spending, no increased libido, no grandiosity.  She is able to sleep well.  The trazodone is very helpful.  If she does not take it she does not feel as well the next day.  She still has anxiety at times but does not take the Klonopin very often.  She might go several days in between taking it.  Denies dizziness, syncope, seizures, numbness, tingling, tremor, tics, unsteady gait, slurred speech, confusion. Denies muscle or joint pain, stiffness, or dystonia.  Individual Medical History/ Review of Systems: Changes? :No   Allergies: Codeine, Sonata [zaleplon], Meloxicam, Other, Topamax [topiramate], Emgality [galcanezumab-gnlm], and Penicillins  Current Medications:  Current Outpatient Medications:  .  acetaminophen (TYLENOL) 500 MG tablet, Take 1,000 mg by mouth every 6 (six) hours as needed for moderate pain or headache., Disp: , Rfl:  .  albuterol (VENTOLIN HFA) 108 (90 Base) MCG/ACT inhaler, Inhale 1-2 puffs into the lungs every 6 (six) hours as needed for wheezing or shortness of breath., Disp: 18 g, Rfl: 0 .  clonazePAM (KLONOPIN) 0.5 MG tablet, Take 1 tablet (0.5 mg total) by mouth 2 (two) times daily as needed for anxiety., Disp: 20 tablet, Rfl: 5 .  cycloSPORINE (RESTASIS) 0.05 % ophthalmic emulsion, Place 1 drop into both  eyes 2 (two) times daily. , Disp: , Rfl:  .  dexlansoprazole (DEXILANT) 60 MG capsule, Take 1 capsule (60 mg total) by mouth daily before breakfast., Disp: 30 capsule, Rfl: 5 .  dicyclomine (BENTYL) 20 MG tablet, Take 1 tablet (20 mg total) by mouth 3 (three) times daily as needed for spasms (abdominal cramping)., Disp: 20 tablet, Rfl: 0 .  divalproex (DEPAKOTE ER) 500 MG 24 hr tablet, Take 1 tablet  (500 mg total) by mouth daily., Disp: 30 tablet, Rfl: 11 .  famotidine (PEPCID) 20 MG tablet, Take 1 tablet (20 mg total) by mouth 2 (two) times daily. (Patient taking differently: Take 20 mg by mouth daily. ), Disp: 60 tablet, Rfl: 5 .  fluticasone (FLONASE) 50 MCG/ACT nasal spray, Place 1 spray into both nostrils daily as needed for allergies. , Disp: , Rfl:  .  fluvoxaMINE (LUVOX) 100 MG tablet, Take 3 po qd, Disp: 270 tablet, Rfl: 1 .  gabapentin (NEURONTIN) 600 MG tablet, Take 2 tablets (1,200 mg total) by mouth 2 (two) times daily., Disp: 120 tablet, Rfl: 5 .  hydrOXYzine (ATARAX/VISTARIL) 50 MG tablet, 1-2 po q6hr prn (Patient taking differently: Take 50-100 mg by mouth every 6 (six) hours as needed for anxiety. ), Disp: 90 tablet, Rfl: 5 .  hyoscyamine (LEVSIN) 0.125 MG tablet, Take 1 tablet (0.125 mg total) by mouth every 4 (four) hours as needed (diarrhea)., Disp: 120 tablet, Rfl: 0 .  nystatin (MYCOSTATIN) 100000 UNIT/ML suspension, 55m swish and spit qid, Disp: 60 mL, Rfl: 0 .  olopatadine (PATANOL) 0.1 % ophthalmic solution, Place 1 drop into both eyes 2 (two) times daily. , Disp: , Rfl:  .  ondansetron (ZOFRAN ODT) 4 MG disintegrating tablet, Take 1 tablet (4 mg total) by mouth every 8 (eight) hours as needed for nausea or vomiting., Disp: 30 tablet, Rfl: 1 .  Rimegepant Sulfate (NURTEC) 75 MG TBDP, Take 75 mg by mouth daily as needed (take for abortive therapy of migraine, no more than 1 tablet in 24 hours or 10 per month)., Disp: 10 tablet, Rfl: 11 .  SUMAtriptan (IMITREX) 50 MG tablet, Take 1 tablet and May repeat in 2 hours if headache persists or recurs. (Do not exceed more then 2 per 24 hour period), Disp: 10 tablet, Rfl: 5 .  traZODone (DESYREL) 100 MG tablet, Take 1-2 tablets (100-200 mg total) by mouth at bedtime as needed for sleep., Disp: 60 tablet, Rfl: 5 .  valACYclovir (VALTREX) 500 MG tablet, Take 500 mg by mouth daily as needed (outbreak). , Disp: , Rfl:  .   cyclobenzaprine (FLEXERIL) 10 MG tablet, 1/2 po at bedtime for TMJ (Patient not taking: Reported on 02/16/2019), Disp: 30 tablet, Rfl: 0 .  lurasidone (LATUDA) 80 MG TABS tablet, Take 1 tablet (80 mg total) by mouth daily with supper., Disp: 30 tablet, Rfl: 1 .  Na Sulfate-K Sulfate-Mg Sulf (SUPREP BOWEL PREP KIT) 17.5-3.13-1.6 GM/177ML SOLN, Take 1 kit by mouth as directed. (Patient not taking: Reported on 01/10/2019), Disp: 354 mL, Rfl: 0 .  nicotine (NICODERM CQ) 21 mg/24hr patch, Place 1 patch (21 mg total) onto the skin daily. (Patient not taking: Reported on 02/16/2019), Disp: 28 patch, Rfl: 0 .  sucralfate (CARAFATE) 1 g tablet, Take 1 tablet (1 g total) by mouth 4 (four) times daily -  with meals and at bedtime for 14 days. Mostly once a day, Disp: 56 tablet, Rfl: 0 Medication Side Effects: none  Family Medical/ Social History: Changes? No  MENTAL  HEALTH EXAM:  Last menstrual period 10/07/2011.There is no height or weight on file to calculate BMI.  General Appearance: unable to assess  Eye Contact:  unable to assess  Speech:  Clear and Coherent  Volume:  Normal  Mood:  Depressed  Affect:  unable to assess  Thought Process:  Goal Directed and Descriptions of Associations: Intact  Orientation:  Full (Time, Place, and Person)  Thought Content: Logical   Suicidal Thoughts:  No  Homicidal Thoughts:  No  Memory:  WNL  Judgement:  Good  Insight:  Good  Psychomotor Activity:  Unable to assess  Concentration:  Concentration: Good  Recall:  Good  Fund of Knowledge: Good  Language: Good  Assets:  Desire for Improvement  ADL's:  Intact  Cognition: WNL  Prognosis:  Good    DIAGNOSES:    ICD-10-CM   1. Mixed obsessional thoughts and acts  F42.2   2. Bipolar affective disorder, currently depressed, moderate ([place])  F31.32   3. Generalized anxiety disorder  F41.1   4. Insomnia, unspecified type  G47.00     Receiving Psychotherapy: Yes With Rinaldo Cloud, LCSW   RECOMMENDATIONS:   PDMP was reviewed. I spent 20 minutes with her. Because of her depression, I feel like increasing the Latuda would be beneficial.  She would like to try it. Increase Latuda to 80 mg p.o. daily. Continue Klonopin 0.5 mg twice daily as needed. Continue Depakote 500 mg daily per neuro. Continue Luvox 100 mg, 3 p.o. daily. Continue gabapentin 600 mg, 2 p.o. twice daily. Continue hydroxyzine 50 mg, 1-2 every 6 hours as needed anxiety. Continue trazodone 100 mg, 1-2 nightly as needed sleep. Continue therapy with Rinaldo Cloud, LCSW. Return in 4 to 6 weeks.  [name], PA-C

## 2019-02-16 NOTE — Progress Notes (Signed)
[facility] Counselor/Therapist Progress Note  Patient ID: Jennifer [name], MRN: [id number],    Date: 02/16/2019  Time Spent: 60 minutes  11:00am to 12:00noon   Treatment Type: Individual Therapy  Reported Symptoms: depressed, anxious, obsessive thoughts, negative, headaches, tearfulness  Mental Status Exam:  Appearance:   n/a   telehealth     Behavior:  Sharing  Motor:  n/a   telehealth  Speech/Language:   Normal Rate  Affect:  n/a   telehealth  Mood:  anxious, depressed and intermittently tearful  Thought process:  goal directed  Thought content:    Obsessions  Sensory/Perceptual disturbances:    WNL  Orientation:  oriented to person, place, time/date, situation, day of week, month of year and year  Attention:  Good  Concentration:  Fair  Memory:  "I forget things often"  Fund of knowledge:   Good  Insight:    Fair  Judgment:   Good  Impulse Control:  Fair   Risk Assessment: Danger to Self:  No Self-injurious Behavior: No Danger to Others: No Duty to Warn:no Physical Aggression / Violence:No  Access to Firearms a concern: No  Gang Involvement:No   Subjective: Patient tearful and saying she can't get better, feeling defeated, does not follow through on suggestion more recently, feeling "nothing is going to help me"; Denies any SI.     Interventions: Cognitive Behavioral Therapy and Solution-Oriented/Positive Psychology  Diagnosis:   ICD-10-CM   1. Mixed obsessional thoughts and acts  F42.2     Plan: Treatment goals may remain the same for a while until met, however progress or lack of, will be noted each session in "Progress" sections.  Treatment Goals Patient is not signing tx goals on computer screen due COVID.  Long term goal: Develop the ability to recognize, accept, and cope with feelings of depression and anxiety successfully in positive and healthy ways.  Short term goal Learn and implement calming skills to reduce overall tension and  moments of increased anxiety.  Strategies: 1)Practice calming strategies that she has used in therapy office on occasions with benefit. Remain open to learning new ones.(breathing, 5 senses, counting, pause button) 2)Take her medications as prescribed. 3) Will learn and practice CBT strategies relating to changing her anxious and depressed thoughts to more hopeful and empowering thoughts.  PROGRESS: Patient was tearful, negative, anxious, depressed, minimizing any progress and any strategies that might help her, and reporting obsessive thoughts from the beginning of session today. When asked she shared that she "was not feeling this way earlier in the week." Frustrated and hard to engage initially which sometime occurs with patient.  Let her vent for some time until she became calmer.  Obsessional thoughts and was eventually able to let go some and see that some of her thinking was not reality-based and was assuming the negative.  It is very challenging for patient to hang onto these positives and we discussed this today as well.  Patient states she thinks she is trying to work on too many things (which had earlier been her choice).  We agreed to limit her focus more right now to trying to better manage her anxiety, including use of calming strategies (deep breathing, walking outside, counting, 5-senses technique, music, being with her 2 dogs).  Did offer suggestion of book that has helped her in the past and she seemed appreciative.  Much more grounded by session end. Goal review and progress/challenges noted with patient.   Next appt within 2 weeks.  Shanon Ace, LCSW

## 2019-02-19 ENCOUNTER — Encounter: Payer: Self-pay | Admitting: Neurology

## 2019-02-20 ENCOUNTER — Telehealth: Payer: Self-pay | Admitting: Internal Medicine

## 2019-02-20 ENCOUNTER — Telehealth: Payer: Self-pay | Admitting: Physician Assistant

## 2019-02-20 NOTE — Telephone Encounter (Signed)
She states that the 80 mg is to strong. Her stomach is hurting 10 x worse. Can she split the 80 Mg in half? She states the 60 mg dose worked great but she is having to much stomach pain and dry heaves with the 80 Mg? She does take with at least 350 calories every night.

## 2019-02-20 NOTE — Telephone Encounter (Signed)
noted 

## 2019-02-20 NOTE — Telephone Encounter (Signed)
Called pt, she wants to cancel TCS scheduled for 03/23/19. States she can't go no more than 5 hours without food or she feels like something is digging out her stomach. She wasn't able to do clear liquids in the past. Feels like she would pass out if she had to be on clear liquids for 1 1/2 days. She is waiting to hear back form her psychiatrist to adjust her medications. Doesn't feel like she will be able to do procedure.

## 2019-02-20 NOTE — Telephone Encounter (Signed)
Jennifer [name], let her know to d/c the 80 mg and go back to 60 mg. Don't cut in half.  If she needs the 60 mg sent in, let me know. Thanks.

## 2019-02-20 NOTE — Telephone Encounter (Signed)
Pt having problems with her Latuda. She has been sick all morning. Pt would like to talk to someone.

## 2019-02-20 NOTE — Telephone Encounter (Signed)
Rtc to Jennifer [name], she had just picked up her 80 mg Latuda which is $100.00. Advised her to come by the office and we can give her some samples of the 60 mg. She agrees and will come this week.

## 2019-02-20 NOTE — Telephone Encounter (Signed)
Endo scheduler informed to cancel procedure. ?

## 2019-02-20 NOTE — Telephone Encounter (Signed)
PATIENT CALLED TO CANCEL PROCEDURE   [phone]

## 2019-02-21 ENCOUNTER — Telehealth: Payer: Self-pay | Admitting: Family Medicine

## 2019-02-21 NOTE — Telephone Encounter (Signed)
Pt called stating that her insurance informed her that the Rimegepant Sulfate (NURTEC) 75 MG TBDP is not on their formulary and if she is to be on it a P.A. should be obtained. Pt would like to be advised.

## 2019-02-22 ENCOUNTER — Encounter: Payer: Self-pay | Admitting: Neurology

## 2019-02-23 ENCOUNTER — Telehealth: Payer: Self-pay

## 2019-02-23 NOTE — Telephone Encounter (Signed)
Nurtec has been approved through 01-19-2020. Patient has been notified via [employer].

## 2019-02-23 NOTE — Telephone Encounter (Signed)
Pending approval for Nurtec 75 mg Key: [id number] ICD 10 code: [id number] and G43.C0  I will update once a decision has been made.

## 2019-02-23 NOTE — Telephone Encounter (Signed)
Approval letter has been faxed to the patient's pharmacy listed below. Confirmation fax has been received.    [place] [place] [phone] (Phone) [phone] (Fax)

## 2019-02-23 NOTE — Telephone Encounter (Signed)
PA for Nurtec has been started on [facility]

## 2019-02-26 ENCOUNTER — Telehealth: Payer: Self-pay | Admitting: Diagnostic Neuroimaging

## 2019-02-26 NOTE — Telephone Encounter (Signed)
Patient calling in with new onset nausea, stomach pain since last night. Does not feel like migraine. She is wondering if it is related to coming off depakote (starting a few days ago), but I do not think it is likely. Recommend she contact PCP or go into urgent care if severe symptoms. Will forward to Dr Brett Fairy as Juluis Rainier.   Penni Bombard, MD [id number], [id number] PM Certified in Neurology, Neurophysiology and Neuroimaging  [facility] [address], [place], [place] [address] [phone]

## 2019-02-28 ENCOUNTER — Telehealth: Payer: Self-pay | Admitting: Physician Assistant

## 2019-02-28 NOTE — Telephone Encounter (Signed)
Error

## 2019-03-02 ENCOUNTER — Ambulatory Visit (INDEPENDENT_AMBULATORY_CARE_PROVIDER_SITE_OTHER): Payer: Medicare PPO | Admitting: Psychiatry

## 2019-03-02 DIAGNOSIS — F422 Mixed obsessional thoughts and acts: Secondary | ICD-10-CM

## 2019-03-02 NOTE — Progress Notes (Signed)
[facility] Counselor/Therapist Progress Note  Patient ID: Jennifer [name], MRN: [id number],    Date: 03/02/2019  Time Spent: 45 minutes 11:15am to 12:00noon  Virtual Visit Note Connected with patient by a video enabled telemedicine/telehealth application or telephone, with their informed consent, and verified patient privacy and that I am speaking with the correct person using two identifiers. I discussed the limitations, risks, security and privacy concerns of performing psychotherapy and management service by telephone and the availability of in person appointments. I also discussed with the patient that there may be a patient responsible charge related to this service. The patient expressed understanding and agreed to proceed. I discussed the treatment planning with the patient. The patient was provided an opportunity to ask questions and all were answered. The patient agreed with the plan and demonstrated an understanding of the instructions. The patient was advised to call  our office if  symptoms worsen or feel they are in a crisis state and need immediate contact.   Therapist Location: [name] Patient Location: home   Treatment Type: Individual Therapy  Reported Symptoms:  Depression, anxiety, No SI  Mental Status Exam:  Appearance:   n/a  telehealth     Behavior:  Sharing  Motor:  n/a   telehealth  Speech/Language:   Normal Rate  Affect:  n/a  telehealth  Mood:  anxious and depressed  Thought process:  goal directed  Thought content:    WNL  Sensory/Perceptual disturbances:    WNL  Orientation:  oriented to person, place, time/date, situation, day of week, month of year and year  Attention:  Fair  Concentration:  Fair  Memory:  "some forgetfulness"  Fund of knowledge:   Good  Insight:    Good/Fair  Judgment:   Good/Fair  Impulse Control:  Fair   Risk Assessment: Danger to Self:  No Self-injurious Behavior: No Danger to Others: No Duty to  Warn:no Physical Aggression / Violence:No  Access to Firearms a concern: No  Gang Involvement:No   Subjective: Patient reports a stressful week so far but does not name any specific stressors.  Does report anxiety, stress, depression ad denies any SI.  Learned later by listening to her that there have been some recent conflicts with husband, his mother and his sister.  Interventions: Cognitive Behavioral Therapy and Solution-Oriented/Positive Psychology  Diagnosis:   ICD-10-CM   1. Mixed obsessional thoughts and acts  F42.2     Plan: Patient is not signing tx goals on computer screen due COVID.  Treatment Goals  Treatment goals may remain the same for a while until met, however progress or lack of, will be noted each session in "Progress" sections.  Long term goal: Develop the ability to recognize, accept, and cope with feelings of depression and anxiety successfully in positive and healthy ways.  Short term goal Learn and implement calming skills to reduce overall tension and moments of increased anxiety.  Strategies: 1)Practice calming strategies that she has used in therapy office on occasions with benefit. Remain open to learning new ones.(breathing, 5 senses, counting, pause button) 2)Take her medications as prescribed. 3) Will learn and practice CBT strategies relating to changing her anxious and depressed thoughts to more hopeful and empowering thoughts.  PROGRESS: Patient today is emotionally volatile with frustrations, anxiety, and depression.  Denies SI.  Issues with husband continue.  Generally anxious with some mixed obsessional thoughts.  Today is having difficulty being supported until later in conversation. Tearful, negative, tending to minimize  suggestions of strategies that have helped her in past. Encouraged her to practice some of her calming strategies that have worked before and she eventually became willing to do this (deep breathing exercises), and it did  help some. In time, she slowed down a bit and became less obsessional in her thoughts. Encouraged to involve herself in some activities today that tend to be more self-soothing for her, or some of the therapeutic workbooks that she feels are helpful. Also reminded her of other calming strategies she has found helpful at other times where she has experienced similar feelings--strategies include not only the deep breathing, but also getting outside and walking some, being with her dogs, listening to music, 5-senses technique, and counting. Goal review and progress/difficulties noted with patient.    Next appt within 2-3 weeks depending on some medical appts.   Shanon Ace, LCSW

## 2019-03-08 ENCOUNTER — Other Ambulatory Visit: Payer: Self-pay

## 2019-03-08 ENCOUNTER — Telehealth: Payer: Self-pay | Admitting: Physician Assistant

## 2019-03-08 MED ORDER — LURASIDONE HCL 60 MG PO TABS: 60.0000 mg | ORAL_TABLET | Freq: Every day | ORAL | 1 refills | Status: DC

## 2019-03-08 NOTE — Telephone Encounter (Signed)
Pt requesting a refill on her Latuda. Stated she was given samples to last until appt. Fill at the [facility].

## 2019-03-08 NOTE — Telephone Encounter (Signed)
Latuda 60 mg #30 submitted to [employer]

## 2019-03-09 ENCOUNTER — Ambulatory Visit: Payer: Medicare PPO | Admitting: Physician Assistant

## 2019-03-13 ENCOUNTER — Encounter: Payer: Self-pay | Admitting: Physician Assistant

## 2019-03-13 ENCOUNTER — Ambulatory Visit (INDEPENDENT_AMBULATORY_CARE_PROVIDER_SITE_OTHER): Payer: Medicare PPO | Admitting: Physician Assistant

## 2019-03-13 DIAGNOSIS — G4700 Insomnia, unspecified: Secondary | ICD-10-CM

## 2019-03-13 DIAGNOSIS — F422 Mixed obsessional thoughts and acts: Secondary | ICD-10-CM | POA: Diagnosis not present

## 2019-03-13 DIAGNOSIS — F509 Eating disorder, unspecified: Secondary | ICD-10-CM

## 2019-03-13 DIAGNOSIS — F3132 Bipolar disorder, current episode depressed, moderate: Secondary | ICD-10-CM | POA: Diagnosis not present

## 2019-03-13 DIAGNOSIS — F411 Generalized anxiety disorder: Secondary | ICD-10-CM

## 2019-03-13 MED ORDER — LAMOTRIGINE 25 MG PO TABS: ORAL_TABLET | ORAL | 1 refills | Status: DC

## 2019-03-13 NOTE — Progress Notes (Signed)
[facility]  Patient ID: Jennifer [name],  MRN: [id number]  PCP: Maryruth Hancock, MD  Date of Evaluation: 03/13/2019 Time spent:20 minutes  Chief Complaint:  Chief Complaint    Anxiety; Depression; Insomnia; Stress     Virtual Visit via Telephone Note  I connected with patient by a video enabled telemedicine application or telephone, with their informed consent, and verified patient privacy and that I am speaking with the correct person using two identifiers.  I am private, in my office and the patient is home.  I discussed the limitations, risks, security and privacy concerns of performing an evaluation and management service by telephone and the availability of in person appointments. I also discussed with the patient that there may be a patient responsible charge related to this service. The patient expressed understanding and agreed to proceed.   I discussed the assessment and treatment plan with the patient. The patient was provided an opportunity to ask questions and all were answered. The patient agreed with the plan and demonstrated an understanding of the instructions.   The patient was advised to call back or seek an in-person evaluation if the symptoms worsen or if the condition fails to improve as anticipated.  I provided 20 minutes of non-face-to-face time during this encounter.  HISTORY/CURRENT STATUS: HPI Not doing well.  Still depressed a lot.  She is sad that she and her mom and she and her husband do not have a better relationship.  "I feel like I am all alone sometimes."  I had increased her Latuda a few months ago but she called earlier this month stating that it seemed to be hurting her stomach.  We decreased it back and her GI symptoms are better.  She reports no binging and purging in a while.  She does not really enjoy anything.  She cries easily.  Energy and motivation are low.  Appetite is normal.  Denies suicidal or homicidal thoughts.  Patient  denies increased energy with decreased need for sleep, no increased talkativeness, no racing thoughts, no impulsivity or risky behaviors, no increased spending, no increased libido, no grandiosity.  Denies dizziness, syncope, seizures, numbness, tingling, tremor, tics, unsteady gait, slurred speech, confusion. Denies muscle or joint pain, stiffness, or dystonia.  Individual Medical History/ Review of Systems: Changes? :No    Past medications for mental health diagnoses include: Risperdal, Seroquel, Prozac, Zoloft,  Wellbutrin, Lamictal, Depakote caused hair loss, Xanax, Ambien, trazodone, Trileptal, Luvox, Topamax, Elavil, Pamelor, BuSpar, doxazosin, prazosin, lithium, Vraylar, [name], Latuda >60 mg caused abd pain and nausea  Allergies: Codeine, Sonata [zaleplon], Meloxicam, Other, Topamax [topiramate], Emgality [galcanezumab-gnlm], and Penicillins  Current Medications:  Current Outpatient Medications:  .  acetaminophen (TYLENOL) 500 MG tablet, Take 1,000 mg by mouth every 6 (six) hours as needed for moderate pain or headache., Disp: , Rfl:  .  clonazePAM (KLONOPIN) 0.5 MG tablet, Take 1 tablet (0.5 mg total) by mouth 2 (two) times daily as needed for anxiety., Disp: 20 tablet, Rfl: 5 .  cycloSPORINE (RESTASIS) 0.05 % ophthalmic emulsion, Place 1 drop into both eyes 2 (two) times daily. , Disp: , Rfl:  .  dicyclomine (BENTYL) 20 MG tablet, Take 1 tablet (20 mg total) by mouth 3 (three) times daily as needed for spasms (abdominal cramping)., Disp: 20 tablet, Rfl: 0 .  famotidine (PEPCID) 20 MG tablet, Take 1 tablet (20 mg total) by mouth 2 (two) times daily. (Patient taking differently: Take 20 mg by mouth daily. ), Disp: 60 tablet,  Rfl: 5 .  fluvoxaMINE (LUVOX) 100 MG tablet, Take 3 po qd, Disp: 270 tablet, Rfl: 1 .  gabapentin (NEURONTIN) 600 MG tablet, Take 2 tablets (1,200 mg total) by mouth 2 (two) times daily., Disp: 120 tablet, Rfl: 5 .  hydrOXYzine (ATARAX/VISTARIL) 50 MG tablet, 1-2 po  q6hr prn (Patient taking differently: Take 50-100 mg by mouth every 6 (six) hours as needed for anxiety. ), Disp: 90 tablet, Rfl: 5 .  hyoscyamine (LEVSIN) 0.125 MG tablet, Take 1 tablet (0.125 mg total) by mouth every 4 (four) hours as needed (diarrhea)., Disp: 120 tablet, Rfl: 0 .  Lurasidone HCl (LATUDA) 60 MG TABS, Take 1 tablet (60 mg total) by mouth daily with supper., Disp: 30 tablet, Rfl: 1 .  ondansetron (ZOFRAN ODT) 4 MG disintegrating tablet, Take 1 tablet (4 mg total) by mouth every 8 (eight) hours as needed for nausea or vomiting., Disp: 30 tablet, Rfl: 1 .  Rimegepant Sulfate (NURTEC) 75 MG TBDP, Take 75 mg by mouth daily as needed (take for abortive therapy of migraine, no more than 1 tablet in 24 hours or 10 per month)., Disp: 10 tablet, Rfl: 11 .  traZODone (DESYREL) 100 MG tablet, Take 1-2 tablets (100-200 mg total) by mouth at bedtime as needed for sleep., Disp: 60 tablet, Rfl: 5 .  valACYclovir (VALTREX) 500 MG tablet, Take 500 mg by mouth daily as needed (outbreak). , Disp: , Rfl:  .  albuterol (VENTOLIN HFA) 108 (90 Base) MCG/ACT inhaler, Inhale 1-2 puffs into the lungs every 6 (six) hours as needed for wheezing or shortness of breath. (Patient not taking: Reported on 03/13/2019), Disp: 18 g, Rfl: 0 .  cyclobenzaprine (FLEXERIL) 10 MG tablet, 1/2 po at bedtime for TMJ (Patient not taking: Reported on 02/16/2019), Disp: 30 tablet, Rfl: 0 .  dexlansoprazole (DEXILANT) 60 MG capsule, Take 1 capsule (60 mg total) by mouth daily before breakfast., Disp: 30 capsule, Rfl: 5 .  fluticasone (FLONASE) 50 MCG/ACT nasal spray, Place 1 spray into both nostrils daily as needed for allergies. , Disp: , Rfl:  .  lamoTRIgine (LAMICTAL) 25 MG tablet, 1 po qhs for 2 weeks, then increase to 2 po qhs., Disp: 60 tablet, Rfl: 1 .  Na Sulfate-K Sulfate-Mg Sulf (SUPREP BOWEL PREP KIT) 17.5-3.13-1.6 GM/177ML SOLN, Take 1 kit by mouth as directed. (Patient not taking: Reported on 01/10/2019), Disp: 354 mL, Rfl:  0 .  nystatin (MYCOSTATIN) 100000 UNIT/ML suspension, 74m swish and spit qid, Disp: 60 mL, Rfl: 0 .  olopatadine (PATANOL) 0.1 % ophthalmic solution, Place 1 drop into both eyes 2 (two) times daily. , Disp: , Rfl:  .  sucralfate (CARAFATE) 1 g tablet, Take 1 tablet (1 g total) by mouth 4 (four) times daily -  with meals and at bedtime for 14 days. Mostly once a day, Disp: 56 tablet, Rfl: 0 .  SUMAtriptan (IMITREX) 50 MG tablet, Take 1 tablet and May repeat in 2 hours if headache persists or recurs. (Do not exceed more then 2 per 24 hour period), Disp: 10 tablet, Rfl: 5 Medication Side Effects: none  Family Medical/ Social History: Changes? Yes marital problems and problems with her mother.  MENTAL HEALTH EXAM:  Last menstrual period 10/07/2011.There is no height or weight on file to calculate BMI.  General Appearance: Casual, Neat and Well Groomed  Eye Contact:  Good  Speech:  Clear and Coherent  Volume:  Normal  Mood:  Depressed and Worthless  Affect:  Depressed and Tearful  Thought Process:  Goal Directed and Descriptions of Associations: Intact  Orientation:  Full (Time, Place, and Person)  Thought Content: Logical   Suicidal Thoughts:  No  Homicidal Thoughts:  No  Memory:  WNL  Judgement:  Good  Insight:  Good  Psychomotor Activity:  Normal  Concentration:  Concentration: Good  Recall:  Good  Fund of Knowledge: Good  Language: Good  Assets:  Desire for Improvement  ADL's:  Intact  Cognition: WNL  Prognosis:  Good    DIAGNOSES:    ICD-10-CM   1. Bipolar affective disorder, currently depressed, moderate ([place])  F31.32   2. Mixed obsessional thoughts and acts  F42.2   3. Insomnia, unspecified type  G47.00   4. Generalized anxiety disorder  F41.1   5. Eating disorder, unspecified type  F50.9     Receiving Psychotherapy: Yes With Rinaldo Cloud, LCSW   RECOMMENDATIONS:  I spent 20 minutes with her. PDMP was reviewed. We discussed going back on Lamictal.  She has taken  it in the past but does not remember if it helped for a time or not.  She does remember that she did not have a rash with it. Counseled patient regarding potential benefits, risks, and side effects of Lamictal to include potential risk of Stevens-Johnson syndrome. Advised patient to stop taking Lamictal and contact office immediately if rash develops and to seek urgent medical attention if rash is severe and/or spreading quickly.  Patient understands and accepts these risks. Start Lamictal 25 mg, 1 p.o. nightly for 2 weeks then increase to 2 p.o. nightly. Continue Klonopin 0.5 mg, 1 p.o. twice daily as needed. Continue Luvox 100 mg, 3 p.o. daily. Continue gabapentin 600 mg, 2 p.o. twice daily. Continue hydroxyzine 50 mg, 1-2 p.o. every 6 hours as needed. Continue Latuda 60 mg, 1 q. evening with food. Continue trazodone 100 mg, 1-2 nightly as needed. Continue therapy with Rinaldo Cloud, LCSW. Return in 4 to 6 weeks.  [name], PA-C

## 2019-03-16 ENCOUNTER — Telehealth: Payer: Self-pay | Admitting: Physician Assistant

## 2019-03-16 ENCOUNTER — Ambulatory Visit (INDEPENDENT_AMBULATORY_CARE_PROVIDER_SITE_OTHER): Payer: Medicare PPO | Admitting: Psychiatry

## 2019-03-16 DIAGNOSIS — F422 Mixed obsessional thoughts and acts: Secondary | ICD-10-CM

## 2019-03-16 NOTE — Telephone Encounter (Signed)
Jennifer [name] to report that she started the lamictal Monday and she is now experiencing severe pain shooting up her neck.  Yesterday thought she was going to pass out.  Was very disoriented. She is thinking this is not a good medication for her.  Please call to discuss. Next appt 04/04/19

## 2019-03-16 NOTE — Telephone Encounter (Signed)
She's already stopped the Lamictal because of the neck pain.  She thought it may be causing a stroke or something.  She had no slurred speech, numbness or tingling or paralysis in extremities.  She is completely fine now.  She asks about increasing the [place] again.  Increase Latuda back to 80 mg.  She reported nausea with that in the past but she will try it for at least a week and if it causes problems she will call.

## 2019-03-16 NOTE — Progress Notes (Signed)
[facility] Counselor/Therapist Progress Note  Patient ID: Jennifer [name], MRN: [id number],    Date: 03/16/2019  Time Spent: 50 minutes 11:00am to 11:50am   Virtual Visit Note via [employer] with patient by a video enabled telemedicine/telehealth application or telephone, with their informed consent, and verified patient privacy and that I am speaking with the correct person using two identifiers. I discussed the limitations, risks, security and privacy concerns of performing psychotherapy and management service by telephone and the availability of in person appointments. I also discussed with the patient that there may be a patient responsible charge related to this service. The patient expressed understanding and agreed to proceed. I discussed the treatment planning with the patient. The patient was provided an opportunity to ask questions and all were answered. The patient agreed with the plan and demonstrated an understanding of the instructions. The patient was advised to call  our office if  symptoms worsen or feel they are in a crisis state and need immediate contact.   Therapist Location: [name] Patient Location: home    Treatment Type: Individual Therapy  Reported Symptoms: anxiety, obsessive thoughts, reports problems with Lamictal and stopped it and states she is going to talk to Ellis Savage of nurse to see if she might try something else but I feel much better physically today than yesterday.  Mental Status Exam:  Appearance:   Casual     Behavior:  Sharing and Passive-Aggressive  Motor:  Normal  Speech/Language:   Normal Rate  Affect:  anxious, depressed,   Mood:  anxious, depressed and labile  Thought process:  goal directed, obsessive  Thought content:    Obsessions  Sensory/Perceptual disturbances:    WNL  Orientation:  oriented to person, place, time/date, situation, day of week, month of year and year  Attention:  Fair  Concentration:  Fair   Memory:  Immediate;   Fair Recent;   [place] of knowledge:   Good  Insight:    Fair  Judgment:   Good/Fair  Impulse Control:  Fair   Risk Assessment: Danger to Self:  No Self-injurious Behavior: No Danger to Others: No Duty to Warn:no Physical Aggression / Violence:No  Access to Firearms a concern: No  Gang Involvement:No   Subjective: Patient anxious and depressed, feeling unsupported by her family and others. Denies SI.    Interventions: Cognitive Behavioral Therapy and Solution-Oriented/Positive Psychology  Diagnosis:   ICD-10-CM   1. Mixed obsessional thoughts and acts  F42.2      Plan: Patient is not signing tx goals on computer screen due COVID.  Treatment Goals  Treatment goals may remain the same for a while until met, however progress or lack of, will be noted each session in "Progress" sections.  Long term goal: Develop the ability to recognize, accept, and cope with feelings of depression and anxiety successfully in positive and healthy ways.  Short term goal Learn and implement calming skills to reduce overall tension and moments of increased anxiety.  Strategies: 1)Practice calming strategies that she has used in therapy office on occasions with benefit. Remain open to learning new ones.(breathing, 5 senses, counting, pause button) 2)Take her medications as prescribed. 3) Will learn and practice CBT strategies relating to changing her anxious and depressed thoughts to more hopeful and empowering thoughts.  PROGRESS: Patient today emotionally volatile and did calm herself some after much tearfulness. Difficulty accepted support although states that's what she needs. Mixed obsessional thoughts. Denies any SI.  States she tried Lamictal as a new med for her and she didn't like the way it made her feel so she stopped it.  States she is "going to speak to nurse to see if she should take the Lamictal again or try something else." Frustrated that "nobody  cares enough".  As her emotions subsided some, she was able to reflect with me on the fact that while some relationships are problematic, there are some people in her life that care about her and try to be supportive and she agreed, as we discussed this and various people in her life who do try to support her.  Also noted a couple examples of behaviors that push people away and patient seemed to understand.  Encouraged her getting outside more with the warmer weather, calming strategies including deep breathing exercises, listening to music, using her previously used counting and 5-senses technique which has helped her in past.   Goal review and progress/obstacles/challenges/efforts all noted with patient.  Next  appt within 2 weeks.   Shanon Ace, LCSW

## 2019-03-20 ENCOUNTER — Emergency Department (HOSPITAL_COMMUNITY)
Admission: EM | Admit: 2019-03-20 | Discharge: 2019-03-20 | Disposition: A | Discharge status: Home / Self Care | Payer: Medicare PPO | Attending: Emergency Medicine | Admitting: Emergency Medicine

## 2019-03-20 ENCOUNTER — Ambulatory Visit: Payer: Medicare PPO | Admitting: Family Medicine

## 2019-03-20 ENCOUNTER — Emergency Department (HOSPITAL_COMMUNITY): Payer: Medicare PPO

## 2019-03-20 ENCOUNTER — Encounter (HOSPITAL_COMMUNITY): Payer: Self-pay | Admitting: *Deleted

## 2019-03-20 DIAGNOSIS — F17210 Nicotine dependence, cigarettes, uncomplicated: Secondary | ICD-10-CM | POA: Diagnosis not present

## 2019-03-20 DIAGNOSIS — Z79899 Other long term (current) drug therapy: Secondary | ICD-10-CM | POA: Insufficient documentation

## 2019-03-20 DIAGNOSIS — R109 Unspecified abdominal pain: Secondary | ICD-10-CM | POA: Insufficient documentation

## 2019-03-20 DIAGNOSIS — M545 Low back pain, unspecified: Secondary | ICD-10-CM

## 2019-03-20 LAB — CBC WITH DIFFERENTIAL/PLATELET
Abs Immature Granulocytes: 0.02 10*3/uL (ref 0.00–0.07)
Basophils Absolute: 0 10*3/uL (ref 0.0–0.1)
Basophils Relative: 1 %
Eosinophils Absolute: 0.1 10*3/uL (ref 0.0–0.5)
Eosinophils Relative: 1 %
HCT: 40.1 % (ref 36.0–46.0)
Hemoglobin: 13.4 g/dL (ref 12.0–15.0)
Immature Granulocytes: 0 %
Lymphocytes Relative: 30 %
Lymphs Abs: 1.8 10*3/uL (ref 0.7–4.0)
MCH: 30.7 pg (ref 26.0–34.0)
MCHC: 33.4 g/dL (ref 30.0–36.0)
MCV: 91.8 fL (ref 80.0–100.0)
Monocytes Absolute: 0.5 10*3/uL (ref 0.1–1.0)
Monocytes Relative: 8 %
Neutro Abs: 3.6 10*3/uL (ref 1.7–7.7)
Neutrophils Relative %: 60 %
Platelets: 187 10*3/uL (ref 150–400)
RBC: 4.37 MIL/uL (ref 3.87–5.11)
RDW: 12.9 % (ref 11.5–15.5)
WBC: 6 10*3/uL (ref 4.0–10.5)
nRBC: 0 % (ref 0.0–0.2)

## 2019-03-20 LAB — HEPATIC FUNCTION PANEL
ALT: 22 U/L (ref 0–44)
AST: 23 U/L (ref 15–41)
Albumin: 4.2 g/dL (ref 3.5–5.0)
Alkaline Phosphatase: 72 U/L (ref 38–126)
Bilirubin, Direct: 0.1 mg/dL (ref 0.0–0.2)
Indirect Bilirubin: 0.5 mg/dL (ref 0.3–0.9)
Total Bilirubin: 0.6 mg/dL (ref 0.3–1.2)
Total Protein: 7.1 g/dL (ref 6.5–8.1)

## 2019-03-20 LAB — BASIC METABOLIC PANEL
Anion gap: 7 (ref 5–15)
BUN: 16 mg/dL (ref 6–20)
CO2: 24 mmol/L (ref 22–32)
Calcium: 8.9 mg/dL (ref 8.9–10.3)
Chloride: 104 mmol/L (ref 98–111)
Creatinine, Ser: 0.83 mg/dL (ref 0.44–1.00)
GFR calc Af Amer: >60 mL/min (ref >60)
GFR calc non Af Amer: >60 mL/min (ref >60)
Glucose, Bld: 127 mg/dL — ABNORMAL HIGH (ref 70–99)
Potassium: 3.4 mmol/L — ABNORMAL LOW (ref 3.5–5.1)
Sodium: 135 mmol/L (ref 135–145)

## 2019-03-20 LAB — URINALYSIS, ROUTINE W REFLEX MICROSCOPIC
Bilirubin Urine: NEGATIVE
Glucose, UA: NEGATIVE mg/dL
Hgb urine dipstick: NEGATIVE
Ketones, ur: NEGATIVE mg/dL
Leukocytes,Ua: NEGATIVE
Nitrite: NEGATIVE
Protein, ur: NEGATIVE mg/dL
Specific Gravity, Urine: 1.004 — ABNORMAL LOW (ref 1.005–1.030)
pH: 6 (ref 5.0–8.0)

## 2019-03-20 LAB — HEMOGLOBIN A1C
Hgb A1c MFr Bld: 5.7 % — ABNORMAL HIGH (ref 4.8–5.6)
Mean Plasma Glucose: 116.89 mg/dL

## 2019-03-20 LAB — CBG MONITORING, ED: Glucose-Capillary: 137 mg/dL — ABNORMAL HIGH (ref 70–99)

## 2019-03-20 MED ORDER — SODIUM CHLORIDE 0.9 % IV BOLUS
1000.0000 mL | Freq: Once | INTRAVENOUS | Status: AC
  Administered 2019-03-20: 1000 mL via INTRAVENOUS

## 2019-03-20 MED ORDER — HYDROCODONE-ACETAMINOPHEN 5-325 MG PO TABS: 1.0000 | ORAL_TABLET | Freq: Four times a day (QID) | ORAL | 0 refills | Status: DC | PRN

## 2019-03-20 MED ORDER — HYDROMORPHONE HCL 1 MG/ML IJ SOLN
0.5000 mg | Freq: Once | INTRAMUSCULAR | Status: AC
  Administered 2019-03-20: 0.5 mg via INTRAVENOUS
  Filled 2019-03-20: qty 1

## 2019-03-20 NOTE — ED Triage Notes (Signed)
Pt with right lower back for 3 weeks, pt denies burning with urination.  Pt states she is thirsty all the time and urinating frequenting.

## 2019-03-20 NOTE — ED Provider Notes (Signed)
[facility] Provider Note   CSN: [id number] Arrival date & time: 03/20/19  1157     History Chief Complaint  Patient presents with  . Back Pain    Jennifer [name] is a 54 y.o. female.  Patient complains of right flank pain with urinary frequency.  The history is provided by the patient. No language interpreter was used.  Back Pain Pain location: Right flank. Quality:  Aching Radiates to:  Does not radiate Pain severity:  Moderate Pain is:  Worse during the day Onset quality:  Sudden Timing:  Constant Progression:  Worsening Chronicity:  New Context: not emotional stress   Relieved by:  Nothing Associated symptoms: no abdominal pain, no chest pain and no headaches        Past Medical History:  Diagnosis Date  . Anxiety   . Binge-eating and purging type anorexia nervosa   . Bipolar disorder ([place])   . Cystitis, interstitial   . Depression   . Esophageal polyp    about 20 years ago  . GERD (gastroesophageal reflux disease)   . OCD (obsessive compulsive disorder)     Patient Active Problem List   Diagnosis Date Noted  . Rib pain on right side 01/31/2019  . Sprain of temporomandibular joint or ligament 01/10/2019  . Oral thrush 01/10/2019  . Rectal pain 12/06/2018  . Sinusitis 11/15/2018  . Abdominal pain, epigastric 10/07/2018  . Diarrhea 10/07/2018  . Periodic headache syndrome, not intractable 09/12/2018  . Bulimia 09/12/2018  . Gastroesophageal reflux disease 09/12/2018  . Sore throat 09/12/2018  . Migraine without status migrainosus, not intractable 09/12/2018  . Generalized anxiety disorder 09/27/2017  . Mixed bipolar I disorder ([place]) 12/05/2015  . OCD (obsessive compulsive disorder) 12/04/2015  . Bipolar affective disorder ([place]) 12/04/2015  . Bipolar affective disorder, current episode manic without psychotic symptoms ([place]) 12/04/2015  . Palpitations 05/25/2014  . Chest wall pain 05/25/2014  . Cellulitis and abscess of buttock  02/15/2013  . Depression 08/09/1975    Past Surgical History:  Procedure Laterality Date  . BLADDER SURGERY    . CHOLECYSTECTOMY    . ESOPHAGOGASTRODUODENOSCOPY  09/28/2016   [facility]; Dr. Therisa Doyne; erosions in the esophagus, 5 cm hiatal hernia, nonbleeding erosive gastropathy s/p biopsied, normal duodenum.  Path with chronic inactive gastritis, no H. pylori or intestinal metaplasia.   Marland Kitchen ESOPHAGOGASTRODUODENOSCOPY (EGD) WITH PROPOFOL N/A 10/17/2018   Dr. Gala Romney: mild reflux esophagitis, small hiatal hernia  . VOCAL CORD LATERALIZATION, ENDOSCOPIC APPROACH W/ MLB       OB History    Gravida  3   Para      Term      Preterm      AB  3   Living  0     SAB  1   TAB  2   Ectopic      Multiple      Live Births              Family History  Problem Relation Age of Onset  . Healthy Mother   . Healthy Father   . Colon cancer Neg Hx   . Colon polyps Neg Hx     Social History   Tobacco Use  . Smoking status: Current Every Day Smoker    Packs/day: 0.50    Years: 20.00    Pack years: 10.00    Types: Cigarettes    Start date: 01/20/1988  . Smokeless tobacco: Never Used  Substance Use Topics  .  Alcohol use: No    Alcohol/week: 0.0 standard drinks  . Drug use: No    Home Medications Prior to Admission medications   Medication Sig Start Date End Date Taking? Authorizing Provider  acetaminophen (TYLENOL) 500 MG tablet Take 1,000 mg by mouth every 6 (six) hours as needed for moderate pain or headache.   Yes [provider]  albuterol (VENTOLIN HFA) 108 (90 Base) MCG/ACT inhaler Inhale 1-2 puffs into the lungs every 6 (six) hours as needed for wheezing or shortness of breath. 11/07/18  Yes Wurst, [place], PA-C  clonazePAM (KLONOPIN) 0.5 MG tablet Take 1 tablet (0.5 mg total) by mouth 2 (two) times daily as needed for anxiety. 01/10/19  Yes Hurst, Teresa T, PA-C  cyclobenzaprine (FLEXERIL) 10 MG tablet 1/2 po at bedtime for TMJ Patient taking differently: Take  5 mg by mouth 3 times/day as needed-between meals & bedtime (temporomandibular). 1/2 po at bedtime for TMJ 01/10/19  Yes Corum, Rex Kras, MD  cycloSPORINE (RESTASIS) 0.05 % ophthalmic emulsion Place 1 drop into both eyes 2 (two) times daily.    Yes [provider]  dicyclomine (BENTYL) 20 MG tablet Take 1 tablet (20 mg total) by mouth 3 (three) times daily as needed for spasms (abdominal cramping). 07/07/18  Yes Long, Wonda Olds, MD  famotidine (PEPCID) 20 MG tablet Take 1 tablet (20 mg total) by mouth 2 (two) times daily. Patient taking differently: Take 20 mg by mouth daily.  09/12/18  Yes Corum, Rex Kras, MD  fluticasone (FLONASE) 50 MCG/ACT nasal spray Place 1 spray into both nostrils daily as needed for allergies.    Yes [provider]  fluvoxaMINE (LUVOX) 100 MG tablet Take 3 po qd Patient taking differently: Take 300 mg by mouth at bedtime.  12/07/18  Yes Hurst, Helene Kelp T, PA-C  gabapentin (NEURONTIN) 600 MG tablet Take 2 tablets (1,200 mg total) by mouth 2 (two) times daily. 11/28/18  Yes [name] T, PA-C  hydrOXYzine (ATARAX/VISTARIL) 50 MG tablet 1-2 po q6hr prn Patient taking differently: Take 50-100 mg by mouth every 6 (six) hours as needed for anxiety.  03/02/18  Yes Hurst, Dorothea Glassman, PA-C  Lurasidone HCl (LATUDA) 60 MG TABS Take 1 tablet (60 mg total) by mouth daily with supper. Patient taking differently: Take 80 mg by mouth daily with supper.  03/08/19  Yes Hurst, Helene Kelp T, PA-C  olopatadine (PATANOL) 0.1 % ophthalmic solution Place 1 drop into both eyes 2 (two) times daily.    Yes [provider]  ondansetron (ZOFRAN ODT) 4 MG disintegrating tablet Take 1 tablet (4 mg total) by mouth every 8 (eight) hours as needed for nausea or vomiting. 02/02/19  Yes Dohmeier, Asencion Partridge, MD  sucralfate (CARAFATE) 1 g tablet Take 1 tablet (1 g total) by mouth 4 (four) times daily -  with meals and at bedtime for 14 days. Mostly once a day Patient taking differently: Take 1-2 g by  mouth daily as needed (for ulcer).  01/16/19 03/20/19 Yes Mahala Menghini, PA-C  traZODone (DESYREL) 100 MG tablet Take 1-2 tablets (100-200 mg total) by mouth at bedtime as needed for sleep. 01/10/19  Yes Hurst, Teresa T, PA-C  valACYclovir (VALTREX) 500 MG tablet Take 500 mg by mouth daily as needed (outbreak).  01/03/18  Yes [provider]  dexlansoprazole (DEXILANT) 60 MG capsule Take 1 capsule (60 mg total) by mouth daily before breakfast. Patient not taking: Reported on 03/20/2019 01/09/19   Mahala Menghini, PA-C  HYDROcodone-acetaminophen (NORCO/VICODIN) 5-325 MG  tablet Take 1 tablet by mouth every 6 (six) hours as needed. 03/20/19   Milton Ferguson, MD  hyoscyamine (LEVSIN) 0.125 MG tablet Take 1 tablet (0.125 mg total) by mouth every 4 (four) hours as needed (diarrhea). 12/12/18   Mahala Menghini, PA-C  lamoTRIgine (LAMICTAL) 25 MG tablet 1 po qhs for 2 weeks, then increase to 2 po qhs. Patient not taking: Reported on 03/20/2019 03/13/19   [name] T, PA-C  Na Sulfate-K Sulfate-Mg Sulf (SUPREP BOWEL PREP KIT) 17.5-3.13-1.6 GM/177ML SOLN Take 1 kit by mouth as directed. Patient not taking: Reported on 01/10/2019 12/19/18   Daneil Dolin, MD  nystatin (MYCOSTATIN) 100000 UNIT/ML suspension 65m swish and spit qid 01/10/19   Corum, LRex Kras MD  Rimegepant Sulfate (NURTEC) 75 MG TBDP Take 75 mg by mouth daily as needed (take for abortive therapy of migraine, no more than 1 tablet in 24 hours or 10 per month). Patient not taking: Reported on 03/20/2019 02/13/19   LDebbora Presto NP  SUMAtriptan (IMITREX) 50 MG tablet Take 1 tablet and May repeat in 2 hours if headache persists or recurs. (Do not exceed more then 2 per 24 hour period) 02/06/19   Lomax, Amy, NP    Allergies    Codeine, Sonata [zaleplon], Meloxicam, Other, Topamax [topiramate], Emgality [galcanezumab-gnlm], and Penicillins  Review of Systems   Review of Systems  Constitutional: Negative for appetite change and fatigue.  HENT:  Negative for congestion, ear discharge and sinus pressure.   Eyes: Negative for discharge.  Respiratory: Negative for cough.   Cardiovascular: Negative for chest pain.  Gastrointestinal: Negative for abdominal pain and diarrhea.  Genitourinary: Negative for frequency and hematuria.  Musculoskeletal: Positive for back pain.  Skin: Negative for rash.  Neurological: Negative for seizures and headaches.  Psychiatric/Behavioral: Negative for hallucinations.    Physical Exam Updated Vital Signs BP 124/79 (BP Location: Left Arm)   Pulse 65   Temp 98.5 F (36.9 C) (Oral)   Resp 16   Ht 5' 4"  (1.626 m)   Wt 78.9 kg   LMP 10/07/2011 Comment: neg preg  SpO2 98%   BMI 29.87 kg/m   Physical Exam Vitals and nursing note reviewed.  Constitutional:      Appearance: She is well-developed.  HENT:     Head: Normocephalic.     Nose: Nose normal.  Eyes:     General: No scleral icterus.    Conjunctiva/sclera: Conjunctivae normal.  Neck:     Thyroid: No thyromegaly.  Cardiovascular:     Rate and Rhythm: Normal rate and regular rhythm.     Heart sounds: No murmur. No friction rub. No gallop.   Pulmonary:     Breath sounds: No stridor. No wheezing or rales.  Chest:     Chest wall: No tenderness.  Abdominal:     General: There is no distension.     Tenderness: There is no abdominal tenderness. There is no rebound.  Genitourinary:    Comments: Tender right flank. Musculoskeletal:        General: Normal range of motion.     Cervical back: Neck supple.  Lymphadenopathy:     Cervical: No cervical adenopathy.  Skin:    Findings: No erythema or rash.  Neurological:     Mental Status: She is alert and oriented to person, place, and time.     Motor: No abnormal muscle tone.     Coordination: Coordination normal.  Psychiatric:        Behavior: Behavior normal.  ED Results / Procedures / Treatments   Labs (all labs ordered are listed, but only abnormal results are displayed) Labs  Reviewed  URINALYSIS, ROUTINE W REFLEX MICROSCOPIC - Abnormal; Notable for the following components:      Result Value   Color, Urine STRAW (*)    Specific Gravity, Urine 1.004 (*)    All other components within normal limits  BASIC METABOLIC PANEL - Abnormal; Notable for the following components:   Potassium 3.4 (*)    Glucose, Bld 127 (*)    All other components within normal limits  CBG MONITORING, ED - Abnormal; Notable for the following components:   Glucose-Capillary 137 (*)    All other components within normal limits  URINE CULTURE  CBC WITH DIFFERENTIAL/PLATELET  HEPATIC FUNCTION PANEL  HEMOGLOBIN A1C    EKG None  Radiology CT Renal Stone Study  Result Date: 03/20/2019 CLINICAL DATA:  Right flank and lower back pain for 3 weeks. Burning with urination. EXAM: CT ABDOMEN AND PELVIS WITHOUT CONTRAST TECHNIQUE: Multidetector CT imaging of the abdomen and pelvis was performed following the standard protocol without IV contrast. COMPARISON:  CT scan dated 07/07/2018 FINDINGS: Lower chest: Moderate chronic hiatal hernia. Heart size is normal. Scarring at both lung bases. Hepatobiliary: No focal liver abnormality is seen. Status post cholecystectomy. No biliary dilatation. Pancreas: Unremarkable. No pancreatic ductal dilatation or surrounding inflammatory changes. Spleen: Normal in size without focal abnormality. Adrenals/Urinary Tract: The kidneys appear normal. No hydronephrosis. No renal or ureteral calculi. Bladder is normal. Stomach/Bowel: Moderate hiatal hernia. The stomach is otherwise normal. Large and small bowel are normal. Appendix is normal. Vascular/Lymphatic: No significant vascular findings are present. No enlarged abdominal or pelvic lymph nodes. Reproductive: Uterus and bilateral adnexa are unremarkable. Other: No abdominal wall hernia or abnormality. No abdominopelvic ascites. Musculoskeletal: No acute or significant osseous findings. IMPRESSION: 1. No acute abnormalities  of the abdomen or pelvis. 2. Moderate chronic hiatal hernia. Electronically Signed   By: Lorriane Shire M.D.   On: 03/20/2019 14:49    Procedures Procedures (including critical care time)  Medications Ordered in ED Medications  sodium chloride 0.9 % bolus 1,000 mL (0 mLs Intravenous Stopped 03/20/19 1359)  HYDROmorphone (DILAUDID) injection 0.5 mg (0.5 mg Intravenous Given 03/20/19 1314)    ED Course  I have reviewed the triage vital signs and the nursing notes.  Pertinent labs & imaging results that were available during my care of the patient were reviewed by me and considered in my medical decision making (see chart for details).    MDM Rules/Calculators/A&P                      Labs and x-rays unremarkable.  This is included urinalysis and CT abdomen.  Patient continues to have right flank pain.  She will be placed on some Vicodin and will follow up with her PCP. Final Clinical Impression(s) / ED Diagnoses Final diagnoses:  Right low back pain, unspecified chronicity, unspecified whether sciatica present    Rx / DC Orders ED Discharge Orders         Ordered    HYDROcodone-acetaminophen (NORCO/VICODIN) 5-325 MG tablet  Every 6 hours PRN     03/20/19 1514           Milton Ferguson, MD 03/20/19 1517

## 2019-03-20 NOTE — Discharge Instructions (Addendum)
Follow up with your doctor as planned next week

## 2019-03-21 ENCOUNTER — Other Ambulatory Visit (HOSPITAL_COMMUNITY): Payer: Medicare Other

## 2019-03-21 ENCOUNTER — Encounter: Payer: Self-pay | Admitting: Family Medicine

## 2019-03-21 ENCOUNTER — Telehealth: Payer: Self-pay | Admitting: Physician Assistant

## 2019-03-21 LAB — URINE CULTURE: Culture: <10000 — AB

## 2019-03-21 NOTE — Telephone Encounter (Signed)
Patient called and said that she went to the emergency room yesterday on 3/1 due to abdominal pain. They tested her sugar and it was 137 and her A1C is 5.7. Please give her a call today or tomorrow in the afternoon at [phone] and let her know if she needs to do.

## 2019-03-22 DIAGNOSIS — M25532 Pain in left wrist: Secondary | ICD-10-CM

## 2019-03-22 HISTORY — DX: Pain in left wrist: M25.532

## 2019-03-22 NOTE — Telephone Encounter (Signed)
There is absolutely nothing wrong with that, so continue same plan.  No need to worry. I'll be monitoring it from time to time for routine purposes but I'm glad to know it's normal now.  I hope she feels better w/ abd pain.

## 2019-03-22 NOTE — Telephone Encounter (Signed)
Spoke with patient and given information, she was very relieved to hear. Thankful for the follow up call.

## 2019-03-23 ENCOUNTER — Encounter (HOSPITAL_COMMUNITY): Payer: Self-pay

## 2019-03-23 ENCOUNTER — Ambulatory Visit (HOSPITAL_COMMUNITY): Admit: 2019-03-23 | Payer: Medicare Other | Admitting: Internal Medicine

## 2019-03-23 SURGERY — COLONOSCOPY WITH PROPOFOL
Anesthesia: Monitor Anesthesia Care

## 2019-03-27 ENCOUNTER — Other Ambulatory Visit: Payer: Self-pay

## 2019-03-27 ENCOUNTER — Encounter: Payer: Self-pay | Admitting: Family Medicine

## 2019-03-27 ENCOUNTER — Ambulatory Visit: Payer: Medicare PPO | Admitting: Family Medicine

## 2019-03-27 VITALS — BP 117/81 | HR 74 | Temp 97.4°F | Ht 64.0 in | Wt 171.4 lb

## 2019-03-27 DIAGNOSIS — L719 Rosacea, unspecified: Secondary | ICD-10-CM | POA: Diagnosis not present

## 2019-03-27 HISTORY — DX: Rosacea, unspecified: L71.9

## 2019-03-27 MED ORDER — METRONIDAZOLE 1 % EX GEL: Freq: Every day | CUTANEOUS | 0 refills | Status: DC

## 2019-03-27 NOTE — Patient Instructions (Addendum)
Discuss with mental health genetic testing for medications  Derm referral  Trial of metro gel

## 2019-03-27 NOTE — Progress Notes (Signed)
Established Patient Office Visit  Subjective:  Patient ID: Jennifer [name], female    DOB: 22-Apr-1965  Age: 54 y.o. MRN: [id number]  CC:  Chief Complaint  Patient presents with  . Rash    redness on face    HPI Jennifer [name] presents for redness at the cheeks. Pt was unable to use the patches. Pt states she is concerned about ? rosacea  On line weight watchers-pt needs weight loss-unable to quit smoking Left arm-brace Mental health following-klonopin/luvox/lautuda-unable to tolerate depakote Herpes-valtrex started last week Headaches-Nurtec Past Medical History:  Diagnosis Date  . Anxiety   . Binge-eating and purging type anorexia nervosa   . Bipolar disorder ([place])   . Cystitis, interstitial   . Depression   . Esophageal polyp    about 20 years ago  . GERD (gastroesophageal reflux disease)   . OCD (obsessive compulsive disorder)     Past Surgical History:  Procedure Laterality Date  . BLADDER SURGERY    . CHOLECYSTECTOMY    . ESOPHAGOGASTRODUODENOSCOPY  09/28/2016   [facility]; Dr. Therisa Doyne; erosions in the esophagus, 5 cm hiatal hernia, nonbleeding erosive gastropathy s/p biopsied, normal duodenum.  Path with chronic inactive gastritis, no H. pylori or intestinal metaplasia.   Marland Kitchen ESOPHAGOGASTRODUODENOSCOPY (EGD) WITH PROPOFOL N/A 10/17/2018   Dr. Gala Romney: mild reflux esophagitis, small hiatal hernia  . VOCAL CORD LATERALIZATION, ENDOSCOPIC APPROACH W/ MLB      Family History  Problem Relation Age of Onset  . Healthy Mother   . Healthy Father   . Colon cancer Neg Hx   . Colon polyps Neg Hx     Social History   Socioeconomic History  . Marital status: Married    Spouse name: Not on file  . Number of children: Not on file  . Years of education: Not on file  . Highest education level: Not on file  Occupational History  . Not on file  Tobacco Use  . Smoking status: Current Every Day Smoker    Packs/day: 0.50    Years: 20.00    Pack years: 10.00    Types:  Cigarettes    Start date: 01/20/1988  . Smokeless tobacco: Never Used  Substance and Sexual Activity  . Alcohol use: No    Alcohol/week: 0.0 standard drinks  . Drug use: No  . Sexual activity: Yes    Birth control/protection: None  Other Topics Concern  . Not on file  Social History Narrative  . Not on file   Social Determinants of Health   Financial Resource Strain:   . Difficulty of Paying Living Expenses: Not on file  Food Insecurity:   . Worried About Charity fundraiser in the Last Year: Not on file  . Ran Out of Food in the Last Year: Not on file  Transportation Needs:   . Lack of Transportation (Medical): Not on file  . Lack of Transportation (Non-Medical): Not on file  Physical Activity:   . Days of Exercise per Week: Not on file  . Minutes of Exercise per Session: Not on file  Stress:   . Feeling of Stress : Not on file  Social Connections:   . Frequency of Communication with Friends and Family: Not on file  . Frequency of Social Gatherings with Friends and Family: Not on file  . Attends Religious Services: Not on file  . Active Member of Clubs or Organizations: Not on file  . Attends Archivist Meetings: Not on file  .  Marital Status: Not on file  Intimate Partner Violence:   . Fear of Current or Ex-Partner: Not on file  . Emotionally Abused: Not on file  . Physically Abused: Not on file  . Sexually Abused: Not on file    Outpatient Medications Prior to Visit  Medication Sig Dispense Refill  . acetaminophen (TYLENOL) 500 MG tablet Take 1,000 mg by mouth every 6 (six) hours as needed for moderate pain or headache.    . albuterol (VENTOLIN HFA) 108 (90 Base) MCG/ACT inhaler Inhale 1-2 puffs into the lungs every 6 (six) hours as needed for wheezing or shortness of breath. 18 g 0  . clonazePAM (KLONOPIN) 0.5 MG tablet Take 1 tablet (0.5 mg total) by mouth 2 (two) times daily as needed for anxiety. 20 tablet 5  . cyclobenzaprine (FLEXERIL) 10 MG tablet 1/2  po at bedtime for TMJ (Patient taking differently: Take 5 mg by mouth 3 times/day as needed-between meals & bedtime (temporomandibular). 1/2 po at bedtime for TMJ) 30 tablet 0  . cycloSPORINE (RESTASIS) 0.05 % ophthalmic emulsion Place 1 drop into both eyes 2 (two) times daily.     Marland Kitchen dicyclomine (BENTYL) 20 MG tablet Take 1 tablet (20 mg total) by mouth 3 (three) times daily as needed for spasms (abdominal cramping). 20 tablet 0  . famotidine (PEPCID) 20 MG tablet Take 1 tablet (20 mg total) by mouth 2 (two) times daily. (Patient taking differently: Take 20 mg by mouth daily. ) 60 tablet 5  . fluticasone (FLONASE) 50 MCG/ACT nasal spray Place 1 spray into both nostrils daily as needed for allergies.     . fluvoxaMINE (LUVOX) 100 MG tablet Take 3 po qd (Patient taking differently: Take 300 mg by mouth at bedtime. ) 270 tablet 1  . gabapentin (NEURONTIN) 600 MG tablet Take 2 tablets (1,200 mg total) by mouth 2 (two) times daily. 120 tablet 5  . hydrOXYzine (ATARAX/VISTARIL) 50 MG tablet 1-2 po q6hr prn (Patient taking differently: Take 50-100 mg by mouth every 6 (six) hours as needed for anxiety. ) 90 tablet 5  . hyoscyamine (LEVSIN) 0.125 MG tablet Take 1 tablet (0.125 mg total) by mouth every 4 (four) hours as needed (diarrhea). 120 tablet 0  . Lurasidone HCl (LATUDA) 60 MG TABS Take 1 tablet (60 mg total) by mouth daily with supper. (Patient taking differently: Take 80 mg by mouth daily with supper. ) 30 tablet 1  . olopatadine (PATANOL) 0.1 % ophthalmic solution Place 1 drop into both eyes 2 (two) times daily.     . ondansetron (ZOFRAN ODT) 4 MG disintegrating tablet Take 1 tablet (4 mg total) by mouth every 8 (eight) hours as needed for nausea or vomiting. 30 tablet 1  . Rimegepant Sulfate (NURTEC) 75 MG TBDP Take 75 mg by mouth daily as needed (take for abortive therapy of migraine, no more than 1 tablet in 24 hours or 10 per month). 10 tablet 11  . traZODone (DESYREL) 100 MG tablet Take 1-2 tablets  (100-200 mg total) by mouth at bedtime as needed for sleep. 60 tablet 5  . valACYclovir (VALTREX) 500 MG tablet Take 500 mg by mouth daily as needed (outbreak).     Marland Kitchen HYDROcodone-acetaminophen (NORCO/VICODIN) 5-325 MG tablet Take 1 tablet by mouth every 6 (six) hours as needed. 20 tablet 0  . lamoTRIgine (LAMICTAL) 25 MG tablet 1 po qhs for 2 weeks, then increase to 2 po qhs. (Patient not taking: Reported on 03/20/2019) 60 tablet 1  . Na Sulfate-K  Sulfate-Mg Sulf (SUPREP BOWEL PREP KIT) 17.5-3.13-1.6 GM/177ML SOLN Take 1 kit by mouth as directed. (Patient not taking: Reported on 01/10/2019) 354 mL 0  . nystatin (MYCOSTATIN) 100000 UNIT/ML suspension 50m swish and spit qid 60 mL 0  . sucralfate (CARAFATE) 1 g tablet Take 1 tablet (1 g total) by mouth 4 (four) times daily -  with meals and at bedtime for 14 days. Mostly once a day (Patient taking differently: Take 1-2 g by mouth daily as needed (for ulcer). ) 56 tablet 0  . SUMAtriptan (IMITREX) 50 MG tablet Take 1 tablet and May repeat in 2 hours if headache persists or recurs. (Do not exceed more then 2 per 24 hour period) 10 tablet 5  . dexlansoprazole (DEXILANT) 60 MG capsule Take 1 capsule (60 mg total) by mouth daily before breakfast. (Patient not taking: Reported on 03/20/2019) 30 capsule 5   No facility-administered medications prior to visit.    Allergies  Allergen Reactions  . Codeine Hives and Shortness Of Breath  . Sonata [Zaleplon] Other (See Comments)    Hallucinations  . Meloxicam Other (See Comments)    Possible chest tightness - instructed by MD not to take  . Other Nausea Only    CODIENE-unable to enter under "Agent" for some reason  . Topamax [Topiramate] Other (See Comments)    Low BP and dizziness  . Emgality [Galcanezumab-Gnlm] Rash  . Penicillins Hives, Swelling and Rash    Has patient had a PCN reaction causing immediate rash, facial/tongue/throat swelling, SOB or lightheadedness with hypotension: yes Has patient had a  PCN reaction causing severe rash involving mucus membranes or skin necrosis: no Has patient had a PCN reaction that required hospitalization: no Has patient had a PCN reaction occurring within the last 10 years: no If all of the above answers are "NO", then may proceed with Cephalosporin use.;     ROS Review of Systems  Skin: Positive for rash.       eczema      Objective:    Physical Exam  Constitutional: She appears well-developed and well-nourished.  HENT:  Head: Normocephalic and atraumatic.  Skin: No rash noted. There is erythema.  Psychiatric: She has a normal mood and affect. Her behavior is normal.    BP 117/81 (BP Location: Right Arm, Patient Position: Sitting)   Pulse 74   Temp (!) 97.4 F (36.3 C) (Temporal)   Ht _0  (1.626 m)   Wt 171 lb 6.4 oz (77.7 kg)   LMP 10/07/2011 Comment: neg preg  SpO2 95%   BMI 29.42 kg/m  Wt Readings from Last 3 Encounters:  03/27/19 171 lb 6.4 oz (77.7 kg)  03/20/19 174 lb (78.9 kg)  02/06/19 178 lb (80.7 kg)     Health Maintenance Due  Topic Date Due  . HIV Screening  09/02/1980  . TETANUS/TDAP  09/02/1984  . MAMMOGRAM  09/03/2015  . COLONOSCOPY  09/03/2015  . PAP SMEAR-Modifier  05/23/2016    Lab Results  Component Value Date   TSH 1.392 12/06/2015   Lab Results  Component Value Date   WBC 6.0 03/20/2019   HGB 13.4 03/20/2019   HCT 40.1 03/20/2019   MCV 91.8 03/20/2019   PLT 187 03/20/2019   Lab Results  Component Value Date   NA 135 03/20/2019   K 3.4 (L) 03/20/2019   CO2 24 03/20/2019   GLUCOSE 127 (H) 03/20/2019   BUN 16 03/20/2019   CREATININE 0.83 03/20/2019   BILITOT 0.6 03/20/2019  ALKPHOS 72 03/20/2019   AST 23 03/20/2019   ALT 22 03/20/2019   PROT 7.1 03/20/2019   ALBUMIN 4.2 03/20/2019   CALCIUM 8.9 03/20/2019   ANIONGAP 7 03/20/2019   Lab Results  Component Value Date   CHOL 170 12/06/2015   Lab Results  Component Value Date   HDL 61 12/06/2015   Lab Results  Component  Value Date   LDLCALC 90 12/06/2015   Lab Results  Component Value Date   TRIG 95 12/06/2015   Lab Results  Component Value Date   CHOLHDL 2.8 12/06/2015   Lab Results  Component Value Date   HGBA1C 5.7 (H) 03/20/2019      Assessment & Plan:  1. Rosacea metrogel Follow-up: derm referral   Exavior Kimmons Hannah Beat, MD

## 2019-03-30 ENCOUNTER — Ambulatory Visit: Payer: Medicare PPO | Admitting: Psychiatry

## 2019-04-01 ENCOUNTER — Ambulatory Visit: Payer: Medicare PPO | Attending: Internal Medicine

## 2019-04-01 DIAGNOSIS — Z23 Encounter for immunization: Secondary | ICD-10-CM

## 2019-04-01 NOTE — Progress Notes (Signed)
   Covid-19 Vaccination Clinic  Name:  Jennifer [name]    MRN: [id number] DOB: [birth year]  04/01/2019  Jennifer [name] was observed post Covid-19 immunization for 30 minutes based on pre-vaccination screening without incident. She was provided with Vaccine Information Sheet and instruction to access the V-Safe system.   Jennifer [name] was instructed to call 911 with any severe reactions post vaccine: Marland Kitchen Difficulty breathing  . Swelling of face and throat  . A fast heartbeat  . A bad rash all over body  . Dizziness and weakness   Immunizations Administered    Name Date Dose VIS Date Route   Moderna COVID-19 Vaccine 04/01/2019 11:47 AM 0.5 mL 12/20/2018 Intramuscular   Manufacturer: Moderna   Lot: [id number]   [id number]

## 2019-04-04 ENCOUNTER — Encounter: Payer: Self-pay | Admitting: Physician Assistant

## 2019-04-04 ENCOUNTER — Ambulatory Visit (INDEPENDENT_AMBULATORY_CARE_PROVIDER_SITE_OTHER): Payer: Medicare PPO | Admitting: Physician Assistant

## 2019-04-04 DIAGNOSIS — F411 Generalized anxiety disorder: Secondary | ICD-10-CM | POA: Diagnosis not present

## 2019-04-04 DIAGNOSIS — G43909 Migraine, unspecified, not intractable, without status migrainosus: Secondary | ICD-10-CM

## 2019-04-04 DIAGNOSIS — F319 Bipolar disorder, unspecified: Secondary | ICD-10-CM | POA: Diagnosis not present

## 2019-04-04 DIAGNOSIS — F509 Eating disorder, unspecified: Secondary | ICD-10-CM

## 2019-04-04 DIAGNOSIS — G4700 Insomnia, unspecified: Secondary | ICD-10-CM

## 2019-04-04 DIAGNOSIS — F422 Mixed obsessional thoughts and acts: Secondary | ICD-10-CM | POA: Diagnosis not present

## 2019-04-04 NOTE — Progress Notes (Signed)
[facility]  Patient ID: Jennifer [name],  MRN: [id number]  PCP: Maryruth Hancock, MD  Date of Evaluation: 04/04/2019 Time spent:20 minutes  Chief Complaint:  Chief Complaint    Anxiety; Depression; Insomnia     Virtual Visit via Telephone Note  I connected with patient by a video enabled telemedicine application or telephone, with their informed consent, and verified patient privacy and that I am speaking with the correct person using two identifiers.  I am private, in my office and the patient is home.  The first half of the visit was via WebEx but she was having trouble on her and so the remainder of the visit was via phone.  I discussed the limitations, risks, security and privacy concerns of performing an evaluation and management service by telephone and the availability of in person appointments. I also discussed with the patient that there may be a patient responsible charge related to this service. The patient expressed understanding and agreed to proceed.   I discussed the assessment and treatment plan with the patient. The patient was provided an opportunity to ask questions and all were answered. The patient agreed with the plan and demonstrated an understanding of the instructions.   The patient was advised to call back or seek an in-person evaluation if the symptoms worsen or if the condition fails to improve as anticipated.  I provided 20 minutes of non-face-to-face time during this encounter.  HISTORY/CURRENT STATUS: HPI For routine med check.  Patient states she feels a lot better than she did last time.  However when we increased the Latuda to 80 mg, she started having more stomach issues so went back down to 60 mg.  She is having less GI symptoms and her mood is stable right now.  "I do have my ups and downs but they are not as bad."  She is able to enjoy some things.  Energy and motivation are good.  She is taking walks some days, and walking her dogs.  She  still has times where even with the techniques she has learned in therapy, and she has trouble with depression or anxiety and she needs to lay down for a little while.  She usually feels better after that.  She continues to cry easily if triggered but not for no known reason.  She denies suicidal or homicidal thoughts.  Patient denies increased energy with decreased need for sleep, no increased talkativeness, no racing thoughts, no impulsivity or risky behaviors, no increased spending, no increased libido, no grandiosity.  Her migraines seem to be controlled.  She had one recently and took the Nurtec and Zofran at the same time and it helped a lot.  She does have TMJ for which she uses Flexeril occasionally, which helps.  Otherwise she is not having any side effects from the medications, including tremor or tics, no reports of akathisia, no specific muscle or joint pain and no dystonia.  No seizures or syncope.  Individual Medical History/ Review of Systems: Changes? :No    Past medications for mental health diagnoses include: Risperdal, Seroquel, Prozac, Zoloft,  Wellbutrin, Lamictal, Depakote caused hair loss, Xanax, Ambien, trazodone, Trileptal, Luvox, Topamax, Elavil, Pamelor, BuSpar, doxazosin, prazosin, lithium, Vraylar, [name], Latuda >60 mg caused abd pain and nausea  Allergies: Codeine, Sonata [zaleplon], Meloxicam, Other, Topamax [topiramate], Emgality [galcanezumab-gnlm], and Penicillins  Current Medications:  Current Outpatient Medications:  .  acetaminophen (TYLENOL) 500 MG tablet, Take 1,000 mg by mouth every 6 (six) hours as needed  for moderate pain or headache., Disp: , Rfl:  .  albuterol (VENTOLIN HFA) 108 (90 Base) MCG/ACT inhaler, Inhale 1-2 puffs into the lungs every 6 (six) hours as needed for wheezing or shortness of breath., Disp: 18 g, Rfl: 0 .  clonazePAM (KLONOPIN) 0.5 MG tablet, Take 1 tablet (0.5 mg total) by mouth 2 (two) times daily as needed for anxiety., Disp: 20  tablet, Rfl: 5 .  cyclobenzaprine (FLEXERIL) 10 MG tablet, 1/2 po at bedtime for TMJ (Patient taking differently: Take 5 mg by mouth 3 times/day as needed-between meals & bedtime (temporomandibular). 1/2 po at bedtime for TMJ), Disp: 30 tablet, Rfl: 0 .  cycloSPORINE (RESTASIS) 0.05 % ophthalmic emulsion, Place 1 drop into both eyes 2 (two) times daily. , Disp: , Rfl:  .  dicyclomine (BENTYL) 20 MG tablet, Take 1 tablet (20 mg total) by mouth 3 (three) times daily as needed for spasms (abdominal cramping)., Disp: 20 tablet, Rfl: 0 .  famotidine (PEPCID) 20 MG tablet, Take 1 tablet (20 mg total) by mouth 2 (two) times daily. (Patient taking differently: Take 20 mg by mouth daily. ), Disp: 60 tablet, Rfl: 5 .  fluticasone (FLONASE) 50 MCG/ACT nasal spray, Place 1 spray into both nostrils daily as needed for allergies. , Disp: , Rfl:  .  fluvoxaMINE (LUVOX) 100 MG tablet, Take 3 po qd (Patient taking differently: Take 300 mg by mouth at bedtime. ), Disp: 270 tablet, Rfl: 1 .  gabapentin (NEURONTIN) 600 MG tablet, Take 2 tablets (1,200 mg total) by mouth 2 (two) times daily., Disp: 120 tablet, Rfl: 5 .  hydrOXYzine (ATARAX/VISTARIL) 50 MG tablet, 1-2 po q6hr prn (Patient taking differently: Take 50-100 mg by mouth every 6 (six) hours as needed for anxiety. ), Disp: 90 tablet, Rfl: 5 .  hyoscyamine (LEVSIN) 0.125 MG tablet, Take 1 tablet (0.125 mg total) by mouth every 4 (four) hours as needed (diarrhea)., Disp: 120 tablet, Rfl: 0 .  Lurasidone HCl (LATUDA) 60 MG TABS, Take 1 tablet (60 mg total) by mouth daily with supper., Disp: 30 tablet, Rfl: 1 .  metroNIDAZOLE (METROGEL) 1 % gel, Apply topically daily., Disp: 45 g, Rfl: 0 .  olopatadine (PATANOL) 0.1 % ophthalmic solution, Place 1 drop into both eyes 2 (two) times daily. , Disp: , Rfl:  .  ondansetron (ZOFRAN ODT) 4 MG disintegrating tablet, Take 1 tablet (4 mg total) by mouth every 8 (eight) hours as needed for nausea or vomiting., Disp: 30 tablet,  Rfl: 1 .  Rimegepant Sulfate (NURTEC) 75 MG TBDP, Take 75 mg by mouth daily as needed (take for abortive therapy of migraine, no more than 1 tablet in 24 hours or 10 per month)., Disp: 10 tablet, Rfl: 11 .  traZODone (DESYREL) 100 MG tablet, Take 1-2 tablets (100-200 mg total) by mouth at bedtime as needed for sleep., Disp: 60 tablet, Rfl: 5 .  valACYclovir (VALTREX) 500 MG tablet, Take 500 mg by mouth daily as needed (outbreak). , Disp: , Rfl:  .  HYDROcodone-acetaminophen (NORCO/VICODIN) 5-325 MG tablet, Take 1 tablet by mouth every 6 (six) hours as needed., Disp: 20 tablet, Rfl: 0 .  Na Sulfate-K Sulfate-Mg Sulf (SUPREP BOWEL PREP KIT) 17.5-3.13-1.6 GM/177ML SOLN, Take 1 kit by mouth as directed. (Patient not taking: Reported on 01/10/2019), Disp: 354 mL, Rfl: 0 .  nystatin (MYCOSTATIN) 100000 UNIT/ML suspension, 54m swish and spit qid, Disp: 60 mL, Rfl: 0 .  sucralfate (CARAFATE) 1 g tablet, Take 1 tablet (1 g total) by mouth  4 (four) times daily -  with meals and at bedtime for 14 days. Mostly once a day (Patient taking differently: Take 1-2 g by mouth daily as needed (for ulcer). ), Disp: 56 tablet, Rfl: 0 .  SUMAtriptan (IMITREX) 50 MG tablet, Take 1 tablet and May repeat in 2 hours if headache persists or recurs. (Do not exceed more then 2 per 24 hour period), Disp: 10 tablet, Rfl: 5 Medication Side Effects: none  Family Medical/ Social History: Changes? No  MENTAL HEALTH EXAM:  Last menstrual period 10/07/2011.There is no height or weight on file to calculate BMI.  General Appearance: Casual, Neat and Well Groomed  Eye Contact:  Good  Speech:  Clear and Coherent and Normal Rate  Volume:  Normal  Mood:  Euthymic  Affect:  Appropriate  Thought Process:  Goal Directed and Descriptions of Associations: Intact  Orientation:  Full (Time, Place, and Person)  Thought Content: Logical   Suicidal Thoughts:  No  Homicidal Thoughts:  No  Memory:  WNL  Judgement:  Good  Insight:  Good   Psychomotor Activity:  Normal  Concentration:  Concentration: Good  Recall:  Good  Fund of Knowledge: Good  Language: Good  Assets:  Desire for Improvement  ADL's:  Intact  Cognition: WNL  Prognosis:  Good    DIAGNOSES:    ICD-10-CM   1. Mixed obsessional thoughts and acts  F42.2   2. Bipolar I disorder ([place])  F31.9   3. Insomnia, unspecified type  G47.00   4. Generalized anxiety disorder  F41.1   5. Eating disorder, unspecified type  F50.9   6. Migraine without status migrainosus, not intractable, unspecified migraine type  G43.909     Receiving Psychotherapy: Yes  Will be seeing a new counselor next week, Dede Query, LCSW.   RECOMMENDATIONS:  PDMP was reviewed.  I spent 20 minutes with her. She asked about having genetic testing done.  Her PCP had recommended it.  We discussed the pros and cons of it and sometimes it can be very helpful in guiding treatment.  She will plan to do a face-to-face visit next time and we will obtain the cheek swab for that. Continue Klonopin 0.5 mg, 1 p.o. twice daily as needed. Continue Luvox 100 mg, 3 p.o. nightly. Continue gabapentin 600 mg, 2 p.o. twice daily. Continue hydroxyzine 50 mg, 1-2 every 6 hours as needed. Continue Latuda 60 mg 1 p.o. q. evening with supper. Continue trazodone 100 mg, 1-2 nightly as needed sleep. Return in 4 weeks.    [name], PA-C

## 2019-04-06 ENCOUNTER — Ambulatory Visit
Admission: EM | Admit: 2019-04-06 | Discharge: 2019-04-06 | Disposition: A | Discharge status: Home / Self Care | Payer: Medicare PPO | Attending: Emergency Medicine | Admitting: Emergency Medicine

## 2019-04-06 ENCOUNTER — Other Ambulatory Visit: Payer: Self-pay

## 2019-04-06 DIAGNOSIS — G43001 Migraine without aura, not intractable, with status migrainosus: Secondary | ICD-10-CM | POA: Diagnosis not present

## 2019-04-06 MED ORDER — KETOROLAC TROMETHAMINE 60 MG/2ML IM SOLN
60.0000 mg | Freq: Once | INTRAMUSCULAR | Status: AC
  Administered 2019-04-06: 60 mg via INTRAMUSCULAR

## 2019-04-06 MED ORDER — ONDANSETRON HCL 4 MG/2ML IJ SOLN
4.0000 mg | Freq: Once | INTRAMUSCULAR | Status: AC
  Administered 2019-04-06: 4 mg via INTRAMUSCULAR

## 2019-04-06 NOTE — Discharge Instructions (Addendum)
Toradol and Zofran IM given in office Rest and drink plenty of fluids Use OTC medications as needed for symptomatic relief Continue to take Tylenol, Nurtec as prescribed for headache Follow up with PCP if symptoms persists Return or go to the ER if you have any new or worsening symptoms such as fever, chills, nausea, vomiting, chest pain, shortness of breath, cough, vision changes, worsening headache despite treatment, slurred speech, facial asymmetry, weakness in arms or legs, etc...  Reviewed expectations re: course of current medical issues

## 2019-04-06 NOTE — ED Triage Notes (Signed)
Pt reports sinus pressure and runny nose and runny x 1 week.  Reports has been taking claritin bid and flonase daily.  Reports woke up at 2am with headache.  Has been taking tylenol and  Nurtec without relief.

## 2019-04-06 NOTE — ED Provider Notes (Signed)
[facility]   [id number] 04/06/19 Arrival Time: 1220  [id number]  SUBJECTIVE:  Jennifer [name] is a 54 y.o. female who complains of headache that started this morning.  Report sinus pressure and runny nose for the past 1 week has been using Claritin and Flonase daily.  Denies  recent head trauma.  Patient localizes her pain to the generalized head.  Describes the pain as constant and throbbing in character.  Patient has tried OTC medication without relief. Symptoms are made worse with light.  Reports similar symptoms in the past that improved with Toradol IM.  This is not the worst headache of their life.  Patient denies fever, chills, nausea, vomiting, aura, rhinorrhea, watery eyes, chest pain, SOB, abdominal pain, weakness, numbness or tingling, slurred speech.     ROS: As per HPI.  All other pertinent ROS negative.     Past Medical History:  Diagnosis Date  . Anxiety   . Binge-eating and purging type anorexia nervosa   . Bipolar disorder ([place])   . Cystitis, interstitial   . Depression   . Esophageal polyp    about 20 years ago  . GERD (gastroesophageal reflux disease)   . OCD (obsessive compulsive disorder)    Past Surgical History:  Procedure Laterality Date  . BLADDER SURGERY    . CHOLECYSTECTOMY    . ESOPHAGOGASTRODUODENOSCOPY  09/28/2016   [facility]; Dr. Therisa Doyne; erosions in the esophagus, 5 cm hiatal hernia, nonbleeding erosive gastropathy s/p biopsied, normal duodenum.  Path with chronic inactive gastritis, no H. pylori or intestinal metaplasia.   Marland Kitchen ESOPHAGOGASTRODUODENOSCOPY (EGD) WITH PROPOFOL N/A 10/17/2018   Dr. Gala Romney: mild reflux esophagitis, small hiatal hernia  . VOCAL CORD LATERALIZATION, ENDOSCOPIC APPROACH W/ MLB     Allergies  Allergen Reactions  . Codeine Hives and Shortness Of Breath  . Sonata [Zaleplon] Other (See Comments)    Hallucinations  . Meloxicam Other (See Comments)    Possible chest tightness - instructed by MD not to take  . Other  Nausea Only    CODIENE-unable to enter under "Agent" for some reason  . Topamax [Topiramate] Other (See Comments)    Low BP and dizziness  . Emgality [Galcanezumab-Gnlm] Rash  . Penicillins Hives, Swelling and Rash    Has patient had a PCN reaction causing immediate rash, facial/tongue/throat swelling, SOB or lightheadedness with hypotension: yes Has patient had a PCN reaction causing severe rash involving mucus membranes or skin necrosis: no Has patient had a PCN reaction that required hospitalization: no Has patient had a PCN reaction occurring within the last 10 years: no If all of the above answers are "NO", then may proceed with Cephalosporin use.;    No current facility-administered medications on file prior to encounter.   Current Outpatient Medications on File Prior to Encounter  Medication Sig Dispense Refill  . acetaminophen (TYLENOL) 500 MG tablet Take 1,000 mg by mouth every 6 (six) hours as needed for moderate pain or headache.    . clonazePAM (KLONOPIN) 0.5 MG tablet Take 1 tablet (0.5 mg total) by mouth 2 (two) times daily as needed for anxiety. 20 tablet 5  . cycloSPORINE (RESTASIS) 0.05 % ophthalmic emulsion Place 1 drop into both eyes 2 (two) times daily.     Marland Kitchen dicyclomine (BENTYL) 20 MG tablet Take 1 tablet (20 mg total) by mouth 3 (three) times daily as needed for spasms (abdominal cramping). 20 tablet 0  . famotidine (PEPCID) 20 MG tablet Take 1 tablet (20 mg total) by mouth  2 (two) times daily. (Patient taking differently: Take 20 mg by mouth daily. ) 60 tablet 5  . fluticasone (FLONASE) 50 MCG/ACT nasal spray Place 1 spray into both nostrils daily as needed for allergies.     . fluvoxaMINE (LUVOX) 100 MG tablet Take 3 po qd (Patient taking differently: Take 300 mg by mouth at bedtime. ) 270 tablet 1  . gabapentin (NEURONTIN) 600 MG tablet Take 2 tablets (1,200 mg total) by mouth 2 (two) times daily. 120 tablet 5  . Lurasidone HCl (LATUDA) 60 MG TABS Take 1 tablet (60  mg total) by mouth daily with supper. 30 tablet 1  . metroNIDAZOLE (METROGEL) 1 % gel Apply topically daily. 45 g 0  . olopatadine (PATANOL) 0.1 % ophthalmic solution Place 1 drop into both eyes 2 (two) times daily.     . ondansetron (ZOFRAN ODT) 4 MG disintegrating tablet Take 1 tablet (4 mg total) by mouth every 8 (eight) hours as needed for nausea or vomiting. 30 tablet 1  . Rimegepant Sulfate (NURTEC) 75 MG TBDP Take 75 mg by mouth daily as needed (take for abortive therapy of migraine, no more than 1 tablet in 24 hours or 10 per month). 10 tablet 11  . sucralfate (CARAFATE) 1 g tablet Take 1 tablet (1 g total) by mouth 4 (four) times daily -  with meals and at bedtime for 14 days. Mostly once a day (Patient taking differently: Take 1-2 g by mouth daily as needed (for ulcer). ) 56 tablet 0  . traZODone (DESYREL) 100 MG tablet Take 1-2 tablets (100-200 mg total) by mouth at bedtime as needed for sleep. 60 tablet 5  . valACYclovir (VALTREX) 500 MG tablet Take 500 mg by mouth daily as needed (outbreak).     Marland Kitchen albuterol (VENTOLIN HFA) 108 (90 Base) MCG/ACT inhaler Inhale 1-2 puffs into the lungs every 6 (six) hours as needed for wheezing or shortness of breath. 18 g 0  . cyclobenzaprine (FLEXERIL) 10 MG tablet 1/2 po at bedtime for TMJ (Patient taking differently: Take 5 mg by mouth 3 times/day as needed-between meals & bedtime (temporomandibular). 1/2 po at bedtime for TMJ) 30 tablet 0  . HYDROcodone-acetaminophen (NORCO/VICODIN) 5-325 MG tablet Take 1 tablet by mouth every 6 (six) hours as needed. 20 tablet 0  . hydrOXYzine (ATARAX/VISTARIL) 50 MG tablet 1-2 po q6hr prn (Patient taking differently: Take 50-100 mg by mouth every 6 (six) hours as needed for anxiety. ) 90 tablet 5  . hyoscyamine (LEVSIN) 0.125 MG tablet Take 1 tablet (0.125 mg total) by mouth every 4 (four) hours as needed (diarrhea). 120 tablet 0  . Na Sulfate-K Sulfate-Mg Sulf (SUPREP BOWEL PREP KIT) 17.5-3.13-1.6 GM/177ML SOLN Take 1  kit by mouth as directed. (Patient not taking: Reported on 01/10/2019) 354 mL 0  . nystatin (MYCOSTATIN) 100000 UNIT/ML suspension 15m swish and spit qid 60 mL 0  . SUMAtriptan (IMITREX) 50 MG tablet Take 1 tablet and May repeat in 2 hours if headache persists or recurs. (Do not exceed more then 2 per 24 hour period) 10 tablet 5   Social History   Socioeconomic History  . Marital status: Married    Spouse name: Not on file  . Number of children: Not on file  . Years of education: Not on file  . Highest education level: Not on file  Occupational History  . Not on file  Tobacco Use  . Smoking status: Current Every Day Smoker    Packs/day: 0.50  Years: 20.00    Pack years: 10.00    Types: Cigarettes    Start date: 01/20/1988  . Smokeless tobacco: Never Used  Substance and Sexual Activity  . Alcohol use: No    Alcohol/week: 0.0 standard drinks  . Drug use: No  . Sexual activity: Yes    Birth control/protection: None  Other Topics Concern  . Not on file  Social History Narrative  . Not on file   Social Determinants of Health   Financial Resource Strain:   . Difficulty of Paying Living Expenses:   Food Insecurity:   . Worried About Charity fundraiser in the Last Year:   . Arboriculturist in the Last Year:   Transportation Needs:   . Film/video editor (Medical):   Marland Kitchen Lack of Transportation (Non-Medical):   Physical Activity:   . Days of Exercise per Week:   . Minutes of Exercise per Session:   Stress:   . Feeling of Stress :   Social Connections:   . Frequency of Communication with Friends and Family:   . Frequency of Social Gatherings with Friends and Family:   . Attends Religious Services:   . Active Member of Clubs or Organizations:   . Attends Archivist Meetings:   Marland Kitchen Marital Status:   Intimate Partner Violence:   . Fear of Current or Ex-Partner:   . Emotionally Abused:   Marland Kitchen Physically Abused:   . Sexually Abused:    Family History  Problem  Relation Age of Onset  . Healthy Mother   . Healthy Father   . Colon cancer Neg Hx   . Colon polyps Neg Hx     OBJECTIVE:  Vitals:   04/06/19 1230 04/06/19 1231  BP: 121/81   Pulse: 76   Resp: 18   Temp: 98.9 F (37.2 C)   TempSrc: Oral   SpO2: 97%   Weight:  164 lb (74.4 kg)  Height:  5' 4"  (1.626 m)    General appearance: alert; no distress Eyes: PERRLA; EOMI HENT: normocephalic; atraumatic Neck: supple with FROM Lungs: clear to auscultation bilaterally Heart: regular rate and rhythm.  Radial pulses 2+ symmetrical bilaterally Extremities: no edema; symmetrical with no gross deformities Skin: warm and dry Neurologic: CN 2-12 grossly intact; finger to nose without difficulty; normal gait; strength and sensation intact bilaterally about the upper and lower extremities; negative pronator drift Psychological: alert and cooperative; normal mood and affect   ASSESSMENT & PLAN:  1. Migraine without aura and with status migrainosus, not intractable     Meds ordered this encounter  Medications  . ketorolac (TORADOL) injection 60 mg  . ondansetron (ZOFRAN) injection 4 mg    Toradol and Zofran IM given in office Rest and drink plenty of fluids Use OTC medications as needed for symptomatic relief Continue to take Tylenol, and Nurtec as prescribed for headache Follow up with PCP if symptoms persists Return or go to the ER if you have any new or worsening symptoms such as fever, chills, nausea, vomiting, chest pain, shortness of breath, cough, vision changes, worsening headache despite treatment, slurred speech, facial asymmetry, weakness in arms or legs, etc...  Reviewed expectations re: course of current medical issues. Questions answered. Outlined signs and symptoms indicating need for more acute intervention. Patient verbalized understanding. After Visit Summary given.    Emerson Monte, FNP 04/06/19 1250

## 2019-04-13 ENCOUNTER — Ambulatory Visit: Payer: Medicare PPO | Admitting: Psychiatry

## 2019-04-14 ENCOUNTER — Ambulatory Visit: Payer: Medicare PPO | Admitting: Psychiatry

## 2019-04-17 ENCOUNTER — Telehealth: Payer: Self-pay

## 2019-04-17 NOTE — Telephone Encounter (Signed)
VM received. Called pt back. Pt states that she is was having severe pain where her hernia is located(mid upper abd). Pt has c/o nausea and takes her Zofran sometimes. Pt is taking the Pepcid and admits she skipped some days of taking it. Pain was sharp and is still present today. Pain has eased up some but still present. Pt's bowels are different from day to day. Today pt states she has had 4 formed BMs and some days, she has up to 7.   Pt also mentioned that she has been getting Pepcid from her PCP but would like our office to start prescribing it since she is seen here. Please advise.

## 2019-04-18 ENCOUNTER — Ambulatory Visit: Payer: Medicare PPO | Admitting: Adult Health Nurse Practitioner

## 2019-04-18 NOTE — Telephone Encounter (Signed)
Routing LSL

## 2019-04-19 NOTE — Telephone Encounter (Signed)
Spoke with pt. Pt notified of LSL recommendations and wanted an apt in April. Pt scheduled for 05/03/19 @ 8:00 AM.

## 2019-04-19 NOTE — Telephone Encounter (Signed)
I'm not sure where this message had been. Initial documentation on 3/29 and routed to me on 3/30 after I left for day.   Patient last seen in 11/2018. She has multiple complaints. She needs an office visit. Take pepcid daily until ov. For severe pain go to ED in interim.

## 2019-04-26 DIAGNOSIS — M654 Radial styloid tenosynovitis [de Quervain]: Secondary | ICD-10-CM | POA: Diagnosis not present

## 2019-04-26 DIAGNOSIS — M24132 Other articular cartilage disorders, left wrist: Secondary | ICD-10-CM | POA: Diagnosis not present

## 2019-04-27 ENCOUNTER — Ambulatory Visit: Payer: Medicare PPO | Admitting: Psychiatry

## 2019-04-27 DIAGNOSIS — F0634 Mood disorder due to known physiological condition with mixed features: Secondary | ICD-10-CM | POA: Diagnosis not present

## 2019-04-28 ENCOUNTER — Telehealth: Payer: Self-pay | Admitting: Physician Assistant

## 2019-04-28 NOTE — Telephone Encounter (Signed)
Please triage

## 2019-04-28 NOTE — Telephone Encounter (Signed)
Pt called and was very upset. She said she's had a bad day and needs to speak with T.Hurst. Pt has appt on 05/02/19. Pt is requesting a call back.

## 2019-04-28 NOTE — Telephone Encounter (Signed)
Thank you Traci.

## 2019-05-02 ENCOUNTER — Other Ambulatory Visit: Payer: Self-pay

## 2019-05-02 ENCOUNTER — Ambulatory Visit (INDEPENDENT_AMBULATORY_CARE_PROVIDER_SITE_OTHER): Payer: Medicare PPO | Admitting: Physician Assistant

## 2019-05-02 ENCOUNTER — Encounter: Payer: Self-pay | Admitting: Physician Assistant

## 2019-05-02 DIAGNOSIS — F3132 Bipolar disorder, current episode depressed, moderate: Secondary | ICD-10-CM

## 2019-05-02 DIAGNOSIS — F429 Obsessive-compulsive disorder, unspecified: Secondary | ICD-10-CM | POA: Diagnosis not present

## 2019-05-02 DIAGNOSIS — F411 Generalized anxiety disorder: Secondary | ICD-10-CM

## 2019-05-02 DIAGNOSIS — F509 Eating disorder, unspecified: Secondary | ICD-10-CM

## 2019-05-02 NOTE — Progress Notes (Signed)
[facility]  Patient ID: Jennifer [name],  MRN: [id number]  PCP: Jennifer Hancock, MD  Date of Evaluation: 05/02/2019 Time spent:35 minutes  Chief Complaint:  Chief Complaint    Anxiety; Depression; Insomnia      HISTORY/CURRENT STATUS: HPI For routine med check.  "I'm having major mood swings.  One minute I'm happy or sad, and then in 30 mins, I can so angry and irritable and it's driving me crazy.  It's wearing me out. Jennifer [name] even notices it."  States it's been like this for 6 months or so. Does spend too much money but it's not worse. Not impulsive or doing risky things.  Not grandiose.  No hallucinations.  Last week she had a huge argument with her mom. She isn't emotionally supportive.  Patient called [place] for emotional support, given by our nurse Jennifer Larsen, LPN.  Jennifer [name] states that she was really helpful.  She gets depressed quickly over situations at home.  She is having a good day today.  She does things around the house and most of the time has energy and motivation to do that.  On days that she is feeling a lot better, she will sew some.  Most of the time, she will only be able to do the necessary chores.  Has a hard time enjoying things still.  Her father-in-law is really sick and that concerns her.  She does cry easily.  She is wondering if increasing the Latuda back up will be helpful.  That was decreased recently because of possible GI side effects.  She thinks those side effects may have been related to her eating disorder or something else entirely.  No suicidal or homicidal thoughts.  Several weeks ago, we had discussed doing gene site testing on her.  She would like to proceed.  She has a Company secretary and that is going well so far.  She likes the new counselor and feels like she will be helped by her.  She does get anxious but is always hesitant to take the Klonopin too much.  It does help when she needs it really bad though.  For the most part, the gabapentin  has helped with the anxiety some.  And the hydroxyzine does help for rescue when she is having more mild anxiety.  Patient states she is not binging and purging, at least not "often.  I am doing a lot better than I was."  Denies dizziness, syncope, seizures, numbness, tingling, tremor, tics, unsteady gait, slurred speech, confusion. Denies muscle or joint pain, stiffness, or dystonia.Denies unexplained weight loss, frequent infections, or sores that heal slowly.  No polyphagia, polydipsia, or polyuria. Denies visual changes or paresthesias.   Individual Medical History/ Review of Systems: Changes? :No    Past medications for mental health diagnoses include: Risperdal, Seroquel, Prozac, Zoloft, Wellbutrin, Lamictal, Depakote caused hair loss,Xanax, Ambien, trazodone, Trileptal, Luvox, Topamax, Elavil, Pamelor, BuSpar, doxazosin, prazosin, lithium, Vraylar, [name], Latuda >60 mg caused abd pain and nausea  Allergies: Codeine, Sonata [zaleplon], Meloxicam, Other, Topamax [topiramate], Emgality [galcanezumab-gnlm], and Penicillins  Current Medications:  Current Outpatient Medications:  .  acetaminophen (TYLENOL) 500 MG tablet, Take 1,000 mg by mouth every 6 (six) hours as needed for moderate pain or headache., Disp: , Rfl:  .  albuterol (VENTOLIN HFA) 108 (90 Base) MCG/ACT inhaler, Inhale 1-2 puffs into the lungs every 6 (six) hours as needed for wheezing or shortness of breath., Disp: 18 g, Rfl: 0 .  clonazePAM (KLONOPIN) 0.5 MG tablet,  Take 1 tablet (0.5 mg total) by mouth 2 (two) times daily as needed for anxiety., Disp: 20 tablet, Rfl: 5 .  cyclobenzaprine (FLEXERIL) 10 MG tablet, 1/2 po at bedtime for TMJ (Patient taking differently: Take 5 mg by mouth 3 times/day as needed-between meals & bedtime (temporomandibular). 1/2 po at bedtime for TMJ), Disp: 30 tablet, Rfl: 0 .  cycloSPORINE (RESTASIS) 0.05 % ophthalmic emulsion, Place 1 drop into both eyes 2 (two) times daily. , Disp: , Rfl:  .   diclofenac Sodium (VOLTAREN) 1 % GEL, Apply 1 application topically as needed. , Disp: , Rfl:  .  dicyclomine (BENTYL) 20 MG tablet, Take 1 tablet (20 mg total) by mouth 3 (three) times daily as needed for spasms (abdominal cramping)., Disp: 20 tablet, Rfl: 0 .  famotidine (PEPCID) 20 MG tablet, Take 1 tablet (20 mg total) by mouth 2 (two) times daily., Disp: 60 tablet, Rfl: 5 .  fluticasone (FLONASE) 50 MCG/ACT nasal spray, Place 1 spray into both nostrils daily as needed for allergies. , Disp: , Rfl:  .  fluvoxaMINE (LUVOX) 100 MG tablet, Take 3 po qd (Patient taking differently: Take 300 mg by mouth at bedtime. ), Disp: 270 tablet, Rfl: 1 .  gabapentin (NEURONTIN) 600 MG tablet, Take 2 tablets (1,200 mg total) by mouth 2 (two) times daily., Disp: 120 tablet, Rfl: 5 .  hydrOXYzine (ATARAX/VISTARIL) 50 MG tablet, 1-2 po q6hr prn (Patient taking differently: Take 50-100 mg by mouth every 6 (six) hours as needed for anxiety. ), Disp: 90 tablet, Rfl: 5 .  hyoscyamine (LEVSIN) 0.125 MG tablet, Take 1 tablet (0.125 mg total) by mouth every 4 (four) hours as needed (diarrhea)., Disp: 120 tablet, Rfl: 0 .  lidocaine-prilocaine (EMLA) cream, , Disp: , Rfl:  .  Lurasidone HCl (LATUDA) 60 MG TABS, Take 1 tablet (60 mg total) by mouth daily with supper. (Patient taking differently: Take 80 mg by mouth daily with supper. ), Disp: 30 tablet, Rfl: 1 .  metroNIDAZOLE (METROGEL) 1 % gel, Apply topically daily., Disp: 45 g, Rfl: 0 .  olopatadine (PATANOL) 0.1 % ophthalmic solution, Place 1 drop into both eyes 2 (two) times daily. , Disp: , Rfl:  .  ondansetron (ZOFRAN ODT) 4 MG disintegrating tablet, Take 1 tablet (4 mg total) by mouth every 8 (eight) hours as needed for nausea or vomiting., Disp: 30 tablet, Rfl: 1 .  Rimegepant Sulfate (NURTEC) 75 MG TBDP, Take 75 mg by mouth daily as needed (take for abortive therapy of migraine, no more than 1 tablet in 24 hours or 10 per month)., Disp: 10 tablet, Rfl: 11 .   traZODone (DESYREL) 100 MG tablet, Take 1-2 tablets (100-200 mg total) by mouth at bedtime as needed for sleep., Disp: 60 tablet, Rfl: 5 .  valACYclovir (VALTREX) 500 MG tablet, Take 500 mg by mouth daily as needed (outbreak). , Disp: , Rfl:  Medication Side Effects: none  Family Medical/ Social History: Changes? No  MENTAL HEALTH EXAM:  Last menstrual period 10/07/2011.There is no height or weight on file to calculate BMI.  General Appearance: Casual, Neat and Well Groomed  Eye Contact:  Good  Speech:  Clear and Coherent and Normal Rate  Volume:  Normal  Mood:  Euthymic  Affect:  Appropriate  Thought Process:  Goal Directed and Descriptions of Associations: Intact  Orientation:  Full (Time, Place, and Person)  Thought Content: Logical   Suicidal Thoughts:  No  Homicidal Thoughts:  No  Memory:  WNL  Judgement:  Good  Insight:  Good  Psychomotor Activity:  Normal  Concentration:  Concentration: Good  Recall:  Good  Fund of Knowledge: Good  Language: Good  Assets:  Desire for Improvement  ADL's:  Intact  Cognition: WNL  Prognosis:  Good    DIAGNOSES:    ICD-10-CM   1. Bipolar affective disorder, currently depressed, moderate ([place])  F31.32   2. Generalized anxiety disorder  F41.1   3. Eating disorder, unspecified type  F50.9   4. Obsessive-compulsive disorder, unspecified type  F42.9     Receiving Psychotherapy: Yes Dede Query, LCSW   RECOMMENDATIONS:  PDMP reviewed.  I spent 35 mins w/ her. GeneSight Testing done. Increase Latuda to 80 mg q evening with food.  Continue Klonopin 0.5 mg, 1 p.o. twice daily as needed. Continue Luvox 100 mg, 3 p.o. nightly. Continue gabapentin 600 mg, 2 p.o. twice daily. Continue hydroxyzine 50 mg, 1-2 every 6 hours as needed. Continue trazodone 100 mg, 1-2 p.o. nightly as needed sleep. Continue therapy. Return in 4 weeks.  [name], PA-C

## 2019-05-03 ENCOUNTER — Other Ambulatory Visit: Payer: Self-pay

## 2019-05-03 ENCOUNTER — Encounter: Payer: Self-pay | Admitting: Gastroenterology

## 2019-05-03 ENCOUNTER — Ambulatory Visit: Payer: Medicare PPO | Admitting: Gastroenterology

## 2019-05-03 ENCOUNTER — Ambulatory Visit: Payer: Medicare PPO | Attending: Internal Medicine

## 2019-05-03 ENCOUNTER — Telehealth: Payer: Self-pay

## 2019-05-03 VITALS — BP 112/73 | HR 69 | Temp 97.1°F | Ht 64.0 in | Wt 161.4 lb

## 2019-05-03 DIAGNOSIS — R197 Diarrhea, unspecified: Secondary | ICD-10-CM | POA: Diagnosis not present

## 2019-05-03 DIAGNOSIS — R1013 Epigastric pain: Secondary | ICD-10-CM

## 2019-05-03 DIAGNOSIS — Z23 Encounter for immunization: Secondary | ICD-10-CM

## 2019-05-03 DIAGNOSIS — K219 Gastro-esophageal reflux disease without esophagitis: Secondary | ICD-10-CM

## 2019-05-03 MED ORDER — PEG 3350-KCL-NA BICARB-NACL 420 G PO SOLR: 4000.0000 mL | ORAL | 0 refills | Status: DC

## 2019-05-03 MED ORDER — FAMOTIDINE 40 MG PO TABS: 40.0000 mg | ORAL_TABLET | Freq: Two times a day (BID) | ORAL | 3 refills | Status: DC

## 2019-05-03 MED ORDER — HYOSCYAMINE SULFATE 0.125 MG PO TABS: 0.1250 mg | ORAL_TABLET | ORAL | 1 refills | Status: DC | PRN

## 2019-05-03 NOTE — Telephone Encounter (Signed)
PA for TCS submitted via [employer] website. Case approved. [employer]# [id number], valid 08/01/19-08/31/19.

## 2019-05-03 NOTE — Progress Notes (Signed)
Primary Care Physician: Maryruth Hancock, MD  Primary Gastroenterologist:  Garfield Cornea, MD   Chief Complaint  Patient presents with  . Abdominal Pain    mid-upper abd that comes/goes, went 6-8 months w/o purging but reports has restarted a few times and that is when pain started  . Diarrhea    severe on monday all day    HPI: Jennifer [name] is a 54 y.o. female here for follow up of numerous GI symptoms. She was last seen in 11/2018. Patient's past medical history significant for anxiety, binge eating/purging, OCD, bipolar disorder, interstitial cystitis.  Previously seen by [facility].  EGD in September 2020 by Dr. Gala Romney, Showed mild reflux esophagitis and small hiatal hernia.  History of worsening abdominal pain associated with induced vomiting.  She has a history of IBS.  History of anorectal fissures.  No prior colonoscopy.  We schedule one after her last visit patient called canceling stating that she could not go more than 4-5 hours without eating because of worsening abdominal pain.  Previous attempts for colonoscopy with [facility], she has significant abdominal pain with clear liquid diet is never got to the bowel prep.  At her last office visit she was having persisting diarrhea, longer than her usual IBS-D.  Initially she thought it was related to [place].  Then she suspected Depakote.  Also felt like pantoprazole could be contributing.  Stool studies were negative.  Switched from pantoprazole to AcipHex.  She felt like AcipHex caused constipation therefore she was switched to [place].  She called in stating the [place] made her "really sick".  Had a lot of diarrhea.  At that point she was advised to continue Pepcid twice daily along with Mylanta and Carafate as needed.  Patient seen in the ED March 20, 2019 underwent CT renal stone protocol for right flank and lower back pain, dysuria.  Moderate chronic hiatal hernia noted.  LFTs, CBC unremarkable.  States she is doing very well  without any purging for 6 months.  1 month ago she started purging again.  She has done it 5 or 6 times.  In addition she has had at least 10 episodes of waking up early in the morning with dry heaves.  She reports increased stress related to her mother and spouse.  She also stopped taking her Pepcid for about a month.  Since purging, her abdominal pain has returned.  She is using Pepcid 20 mg twice daily for the past week.  Carafate once daily for the past month.  Denies hematemesis.  She is also had some intermittent diarrhea, pretty bad over the weekend.  She thought it might have been something she ate.  She took Zofran yesterday for nausea, no bowel movement since.  She has had 2-3 flares of genital herpes in the last month, using Valtrex as needed.  Really has not utilized Levsin or Bentyl for her diarrhea.  Typical heartburn generally controlled with Pepcid.  Receiving her second shot of Moderna today.  She is excited about seeing her counselor in person now that she will be fully vaccinated.  She has lost about 16 to 17 pounds since January 2021.      Current Outpatient Medications  Medication Sig Dispense Refill  . acetaminophen (TYLENOL) 500 MG tablet Take 1,000 mg by mouth every 6 (six) hours as needed for moderate pain or headache.    . albuterol (VENTOLIN HFA) 108 (90 Base) MCG/ACT inhaler Inhale 1-2 puffs into the  lungs every 6 (six) hours as needed for wheezing or shortness of breath. 18 g 0  . clonazePAM (KLONOPIN) 0.5 MG tablet Take 1 tablet (0.5 mg total) by mouth 2 (two) times daily as needed for anxiety. 20 tablet 5  . cyclobenzaprine (FLEXERIL) 10 MG tablet 1/2 po at bedtime for TMJ (Patient taking differently: Take 5 mg by mouth 3 times/day as needed-between meals & bedtime (temporomandibular). 1/2 po at bedtime for TMJ) 30 tablet 0  . cycloSPORINE (RESTASIS) 0.05 % ophthalmic emulsion Place 1 drop into both eyes 2 (two) times daily.     . diclofenac Sodium (VOLTAREN) 1 % GEL  Apply 1 application topically as needed.     . dicyclomine (BENTYL) 20 MG tablet Take 1 tablet (20 mg total) by mouth 3 (three) times daily as needed for spasms (abdominal cramping). 20 tablet 0  . famotidine (PEPCID) 20 MG tablet Take 1 tablet (20 mg total) by mouth 2 (two) times daily. 60 tablet 5  . fluticasone (FLONASE) 50 MCG/ACT nasal spray Place 1 spray into both nostrils daily as needed for allergies.     . fluvoxaMINE (LUVOX) 100 MG tablet Take 3 po qd (Patient taking differently: Take 300 mg by mouth at bedtime. ) 270 tablet 1  . gabapentin (NEURONTIN) 600 MG tablet Take 2 tablets (1,200 mg total) by mouth 2 (two) times daily. 120 tablet 5  . hydrOXYzine (ATARAX/VISTARIL) 50 MG tablet 1-2 po q6hr prn (Patient taking differently: Take 50-100 mg by mouth every 6 (six) hours as needed for anxiety. ) 90 tablet 5  . hyoscyamine (LEVSIN) 0.125 MG tablet Take 1 tablet (0.125 mg total) by mouth every 4 (four) hours as needed (diarrhea). 120 tablet 0  . lidocaine-prilocaine (EMLA) cream     . Lurasidone HCl (LATUDA) 60 MG TABS Take 1 tablet (60 mg total) by mouth daily with supper. (Patient taking differently: Take 80 mg by mouth daily with supper. ) 30 tablet 1  . metroNIDAZOLE (METROGEL) 1 % gel Apply topically daily. 45 g 0  . olopatadine (PATANOL) 0.1 % ophthalmic solution Place 1 drop into both eyes 2 (two) times daily.     . ondansetron (ZOFRAN ODT) 4 MG disintegrating tablet Take 1 tablet (4 mg total) by mouth every 8 (eight) hours as needed for nausea or vomiting. 30 tablet 1  . Rimegepant Sulfate (NURTEC) 75 MG TBDP Take 75 mg by mouth daily as needed (take for abortive therapy of migraine, no more than 1 tablet in 24 hours or 10 per month). 10 tablet 11  . traZODone (DESYREL) 100 MG tablet Take 1-2 tablets (100-200 mg total) by mouth at bedtime as needed for sleep. 60 tablet 5  . valACYclovir (VALTREX) 500 MG tablet Take 500 mg by mouth daily as needed (outbreak).      No current  facility-administered medications for this visit.    Allergies as of 05/03/2019 - Review Complete 05/03/2019  Allergen Reaction Noted  . Codeine Hives and Shortness Of Breath 02/15/2013  . Sonata [zaleplon] Other (See Comments) 10/22/2017  . Meloxicam Other (See Comments) 05/08/2014  . Other Nausea Only 10/09/2011  . Topamax [topiramate] Other (See Comments) 10/04/2018  . Emgality [galcanezumab-gnlm] Rash 10/04/2018  . Penicillins Hives, Swelling, and Rash 10/09/2011   Past Medical History:  Diagnosis Date  . Anxiety   . Binge-eating and purging type anorexia nervosa   . Bipolar disorder ([place])   . Cystitis, interstitial   . Depression   . Esophageal polyp  about 20 years ago  . GERD (gastroesophageal reflux disease)   . OCD (obsessive compulsive disorder)    Past Surgical History:  Procedure Laterality Date  . BLADDER SURGERY    . CHOLECYSTECTOMY    . ESOPHAGOGASTRODUODENOSCOPY  09/28/2016   [facility]; Dr. Therisa Doyne; erosions in the esophagus, 5 cm hiatal hernia, nonbleeding erosive gastropathy s/p biopsied, normal duodenum.  Path with chronic inactive gastritis, no H. pylori or intestinal metaplasia.   Marland Kitchen ESOPHAGOGASTRODUODENOSCOPY (EGD) WITH PROPOFOL N/A 10/17/2018   Dr. Gala Romney: mild reflux esophagitis, small hiatal hernia  . VOCAL CORD LATERALIZATION, ENDOSCOPIC APPROACH W/ MLB     Family History  Problem Relation Age of Onset  . Healthy Mother   . Healthy Father   . Colon cancer Neg Hx   . Colon polyps Neg Hx    Social History   Tobacco Use  . Smoking status: Current Every Day Smoker    Packs/day: 0.50    Years: 20.00    Pack years: 10.00    Types: Cigarettes    Start date: 01/20/1988  . Smokeless tobacco: Never Used  Substance Use Topics  . Alcohol use: No    Alcohol/week: 0.0 standard drinks  . Drug use: No    ROS:  General: Negative for anorexia,fever, chills, fatigue, weakness.  See HPI ENT: Negative for hoarseness, difficulty swallowing , nasal  congestion. CV: Negative for chest pain, angina, palpitations, dyspnea on exertion, peripheral edema.  Respiratory: Negative for dyspnea at rest, dyspnea on exertion, cough, sputum, wheezing.  GI: See history of present illness. GU:  Negative for dysuria, hematuria, urinary incontinence, urinary frequency, nocturnal urination.  Endo: Negative for unusual weight change.    Physical Examination:   BP 112/73   Pulse 69   Temp (!) 97.1 F (36.2 C) (Oral)   Ht 5\' 4"  (1.626 m)   Wt 161 lb 6.4 oz (73.2 kg)   LMP 10/07/2011   BMI 27.70 kg/m   General: Well-nourished, well-developed in no acute distress.  Eyes: No icterus. Mouth: masked Lungs: Clear to auscultation bilaterally.  Heart: Regular rate and rhythm, no murmurs rubs or gallops.  Abdomen: Bowel sounds are normal, mild epigastric tenderness, nondistended, no hepatosplenomegaly or masses, no abdominal bruits or hernia , no rebound or guarding.  Rectal: External evaluation of perianal area with 2 healing herpetic type lesions. Extremities: No lower extremity edema. No clubbing or deformities. Neuro: Alert and oriented x 4   Skin: Warm and dry, no jaundice.   Psych: Alert and cooperative, normal mood and affect.  Labs:  Lab Results  Component Value Date   ALT 22 03/20/2019   AST 23 03/20/2019   ALKPHOS 72 03/20/2019   BILITOT 0.6 03/20/2019   Lab Results  Component Value Date   WBC 6.0 03/20/2019   HGB 13.4 03/20/2019   HCT 40.1 03/20/2019   MCV 91.8 03/20/2019   PLT 187 03/20/2019    Lab Results  Component Value Date   HGBA1C 5.7 (H) 03/20/2019   Lab Results  Component Value Date   CREATININE 0.83 03/20/2019   BUN 16 03/20/2019   NA 135 03/20/2019   K 3.4 (L) 03/20/2019   CL 104 03/20/2019   CO2 24 03/20/2019     Imaging Studies: No results found.  Impression/Plan:  Pleasant 54 year old female with history of hiatal hernia, GERD, IBS presenting for follow-up.  Complains of recurrent epigastric pain  associated with induced vomiting.  She was doing well until she started purging again about a month ago.  She is also been off her Pepcid for about 1 month.  Recently started back 2 weeks ago.  She has had multiple GI intolerances to PPI therapy.  Generally tolerates Pepcid.  Suspect abdominal pain secondary to gastritis versus trauma from hiatal hernia and repetitive vomiting.  We will increase her Pepcid to 40 mg twice daily for the next 12 weeks with goal of dropping back down to 20 mg twice daily thereafter.  Continues with intermittent diarrhea with history of IBS-D.  Increased stress at home as well as with her mother.  Diarrhea is intermittent.  Has not utilized her Bentyl or Levsin.  Encouraged her to start with Levsin since this 1 seems to work best for her.  Do not use along with Bentyl.  If diarrhea not controlled, she will let me know.  She is interested in pursuing a colonoscopy in the next couple of months.  Currently we are scheduled out in July with propofol cases.  ASA I classification.  I have discussed the risks, alternatives, benefits with regards to but not limited to the risk of reaction to medication, bleeding, infection, perforation and the patient is agreeable to proceed. Written consent to be obtained.  If she decides sometime between now and then that she does not feel like she can tolerate 3 (patient states she cannot tolerate clear liquid diet due to increased abdominal pain), would offer her Cologuard testing for colon cancer screening although she is aware that this will not help evaluate her chronic intermittent diarrhea.

## 2019-05-03 NOTE — Patient Instructions (Signed)
1. Increase pepcid to 40mg  twice daily for 12 weeks. Then we will drop back to 20mg  twice daily. RX provided.  2. Use Bentyl OR Levsin for abdominal cramping, diarrhea. I have sent in new RX for Levsin since you prefer this one. If symptoms are not controlled, please let me know.  3. Colonoscopy as scheduled. See separate instructions. If for some reason, you are not able to tolerate colonoscopy, we would offer Cologuard for colon cancer screening. However, Cologuard would not help [place] evaluate your diarrhea.

## 2019-05-03 NOTE — Progress Notes (Signed)
   Covid-19 Vaccination Clinic  Name:  Jennifer [name]    MRN: [id number] DOB: January 22, 1965  05/03/2019  [name] was observed post Covid-19 immunization for 30 minutes based on pre-vaccination screening without incident. She was provided with Vaccine Information Sheet and instruction to access the V-Safe system.   [name] was instructed to call 911 with any severe reactions post vaccine: Marland Kitchen Difficulty breathing  . Swelling of face and throat  . A fast heartbeat  . A bad rash all over body  . Dizziness and weakness   Immunizations Administered    Name Date Dose VIS Date Route   Moderna COVID-19 Vaccine 05/03/2019 11:06 AM 0.5 mL 12/20/2018 Intramuscular   Manufacturer: Moderna   Lot: [id number]   [id number]

## 2019-05-08 ENCOUNTER — Other Ambulatory Visit: Payer: Self-pay | Admitting: Orthopedic Surgery

## 2019-05-08 DIAGNOSIS — M24139 Other articular cartilage disorders, unspecified wrist: Secondary | ICD-10-CM

## 2019-05-09 ENCOUNTER — Telehealth: Payer: Self-pay | Admitting: Family Medicine

## 2019-05-09 ENCOUNTER — Institutional Professional Consult (permissible substitution): Payer: Self-pay | Admitting: Plastic Surgery

## 2019-05-09 NOTE — Telephone Encounter (Signed)
Pt has called Sandy, RN back.  Please call 

## 2019-05-09 NOTE — Telephone Encounter (Signed)
I called pt.  She states she is having worsening migraines, 2-3 / wk.  She is not taking depakote due to hairloss.  She is not on a preventative, taking nurtec and zofran/bendaryl for nausea/vomiting.  She is feeling better now that she has vomited and used ice pack.  I made appt for her to come in 1000 05-10-19 for evaluation.

## 2019-05-09 NOTE — Telephone Encounter (Signed)
Pt called stating she has been having migraines at least twice a week if not more and has already taken the max amount of nurtec and would like to know what options she has states she doesn't know what to do and has put ice on her head/eye to help ease headache.

## 2019-05-09 NOTE — Telephone Encounter (Signed)
LMVM for pt that returned call.  °

## 2019-05-10 ENCOUNTER — Telehealth: Payer: Self-pay | Admitting: *Deleted

## 2019-05-10 ENCOUNTER — Ambulatory Visit: Payer: Medicare PPO | Admitting: Family Medicine

## 2019-05-10 ENCOUNTER — Other Ambulatory Visit: Payer: Self-pay

## 2019-05-10 ENCOUNTER — Encounter: Payer: Self-pay | Admitting: Family Medicine

## 2019-05-10 VITALS — BP 118/76 | HR 72 | Temp 97.6°F | Ht 64.0 in | Wt 159.8 lb

## 2019-05-10 DIAGNOSIS — G43719 Chronic migraine without aura, intractable, without status migrainosus: Secondary | ICD-10-CM | POA: Diagnosis not present

## 2019-05-10 DIAGNOSIS — F3160 Bipolar disorder, current episode mixed, unspecified: Secondary | ICD-10-CM | POA: Diagnosis not present

## 2019-05-10 MED ORDER — ERENUMAB-AOOE 140 MG/ML ~~LOC~~ SOAJ: 140.0000 mg | SUBCUTANEOUS | 3 refills | Status: DC

## 2019-05-10 NOTE — Telephone Encounter (Signed)
Completed [employer] PA on Cover My Meds. Key: [id number]. Awaiting [employer] determination.

## 2019-05-10 NOTE — Progress Notes (Signed)
PATIENT: Jennifer [name] DOB: 21-Aug-1965  REASON FOR VISIT: follow up HISTORY FROM: patient  Chief Complaint  Patient presents with  . Follow-up    worsening migraines, increasing 2-3/wk, doing better today     HISTORY OF PRESENT ILLNESS: Today 05/10/19 Jennifer [name] is a 54 y.o. female here today for follow up for migraines. She has discontinued divalproex d/t hair loss. Headaches have been somewhat worse. She is having daily headaches. She has 2-3 "severe headaches" per week with pounding pain, light sensitivity and nausea. She will try taking 2 tylenol. She averages at least 2-6 tablets of Tylenol every day. Sometimes this helps and sometimes it doesn't. She has tried [employer] and feels this helps. She has also taken Benadryl which helps.   She has tried and failed topiramate (low BP and dizziness), divalproex (hair loss), Emgality (rash, no respiratory symptoms), on gabapentin 1200mg  BID now for anxiety), Imitrex (ineffective).   She is followed closely by PCP and psychiatry. She is having a lot of mood swings. She is taking Latuda 80mg  daily and gabapentin 1200mg  twice daily. She is scared and anxious as medications haven't been working well. She reports having labs recently to determine which medications may work better for her.   HISTORY: (copied from my note on 02/06/2019)  Jennifer [name] is a 54 y.o. female here today for follow up for headaches. She was started on divalproex ER 500mg  at bedtime in 09/2018. She reports that headaches did improve for a few months. Over the past month or so, she has noticed more headaches. She recently started nicotine patches for smoking cessation. She feels this may be correlating. She is also having neck pain and not sleeping well. She has a migraine today, pounding pain with light sensitivity. She usually takes Imitrex that does help but she has been out of medication.  She is staying well-hydrated.  She states that stress is definitely contributing  to her symptoms.  She is followed closely by primary care and psychiatry.   HISTORY: (copied from Dr Dohmeier's note on 10/04/2018)  Jennifer H Hodgesis a 54 y.o.femaleIs seen here as a referral/ revisit from Dr. Sylvan Cheese a headache that she has identified as migraines.   Her headaches are either in the temporal skull, sometimes in the right ( most often 0 sometimes in the left/. But she has sometimes neck pain, too. Migraine can last 3 days, photophobia bothers her, sounds, too. She has nausea.  She used Maxalt with less and less effect, after years of good control. She is allergic to Topamax, she believes it caused her to have HTN. Propanolol was tried, and affected her memory. Depakote was tried with psychiatry- She is not sure why it was discontinued, may be it interfered with Latuda. She is afraid of weight gain.  She hs seen Noelle Redmon at [facility], who wanted her to take [facility] in June 2020 and had given herself one more dose in July .and developed hives, whelps and joint pain. She attributed all this to the [facility].  She felt " forced " to take the shots, concerned about effect on her depression- and the psychiatric medications.  She wants to resume her sucessfull migraine treatment of the past: Toradol, steroids injections " into her hip".She has to be careful with steroids due to "ulcers in her stomach".   For the last 2 month she has been sleeping better after a stressful summer. She has an eating disorder , has been hospitalized 6 times for  bulemia, but reports 6 days without binging and purging. New therapist since she is on medicare.    REVIEW OF SYSTEMS: Out of a complete 14 system review of symptoms, the patient complains only of the following symptoms, headaches, generalized pain, nausea, anxiety, depression, insomnia and all other reviewed systems are negative.  ALLERGIES: Allergies  Allergen Reactions  . Codeine Hives and Shortness Of Breath  . Sonata  [Zaleplon] Other (See Comments)    Hallucinations  . Meloxicam Other (See Comments)    Possible chest tightness - instructed by MD not to take  . Other Nausea Only    CODIENE-unable to enter under "Agent" for some reason  . Topamax [Topiramate] Other (See Comments)    Low BP and dizziness  . Emgality [Galcanezumab-Gnlm] Rash  . Penicillins Hives, Swelling and Rash    Has patient had a PCN reaction causing immediate rash, facial/tongue/throat swelling, SOB or lightheadedness with hypotension: yes Has patient had a PCN reaction causing severe rash involving mucus membranes or skin necrosis: no Has patient had a PCN reaction that required hospitalization: no Has patient had a PCN reaction occurring within the last 10 years: no If all of the above answers are "NO", then may proceed with Cephalosporin use.;     HOME MEDICATIONS: Outpatient Medications Prior to Visit  Medication Sig Dispense Refill  . acetaminophen (TYLENOL) 500 MG tablet Take 1,000 mg by mouth every 6 (six) hours as needed for moderate pain or headache.    . albuterol (VENTOLIN HFA) 108 (90 Base) MCG/ACT inhaler Inhale 1-2 puffs into the lungs every 6 (six) hours as needed for wheezing or shortness of breath. 18 g 0  . clonazePAM (KLONOPIN) 0.5 MG tablet Take 1 tablet (0.5 mg total) by mouth 2 (two) times daily as needed for anxiety. 20 tablet 5  . cyclobenzaprine (FLEXERIL) 10 MG tablet 1/2 po at bedtime for TMJ (Patient taking differently: Take 5 mg by mouth 3 times/day as needed-between meals & bedtime (temporomandibular). 1/2 po at bedtime for TMJ) 30 tablet 0  . cycloSPORINE (RESTASIS) 0.05 % ophthalmic emulsion Place 1 drop into both eyes 2 (two) times daily.     . diclofenac Sodium (VOLTAREN) 1 % GEL Apply 1 application topically as needed.     . dicyclomine (BENTYL) 20 MG tablet Take 1 tablet (20 mg total) by mouth 3 (three) times daily as needed for spasms (abdominal cramping). 20 tablet 0  . famotidine (PEPCID) 40 MG  tablet Take 1 tablet (40 mg total) by mouth 2 (two) times daily. For 12 weeks, then go back to 20mg  twice daily. 60 tablet 3  . fluticasone (FLONASE) 50 MCG/ACT nasal spray Place 1 spray into both nostrils daily as needed for allergies.     . fluvoxaMINE (LUVOX) 100 MG tablet Take 3 po qd (Patient taking differently: Take 300 mg by mouth at bedtime. ) 270 tablet 1  . gabapentin (NEURONTIN) 600 MG tablet Take 2 tablets (1,200 mg total) by mouth 2 (two) times daily. 120 tablet 5  . hydrOXYzine (ATARAX/VISTARIL) 50 MG tablet 1-2 po q6hr prn (Patient taking differently: Take 50-100 mg by mouth every 6 (six) hours as needed for anxiety. ) 90 tablet 5  . hyoscyamine (LEVSIN) 0.125 MG tablet Take 1 tablet (0.125 mg total) by mouth every 4 (four) hours as needed (diarrhea). 120 tablet 1  . lidocaine-prilocaine (EMLA) cream     . Lurasidone HCl (LATUDA) 60 MG TABS Take 1 tablet (60 mg total) by mouth daily with supper. (  Patient taking differently: Take 80 mg by mouth daily with supper. ) 30 tablet 1  . metroNIDAZOLE (METROGEL) 1 % gel Apply topically daily. 45 g 0  . olopatadine (PATANOL) 0.1 % ophthalmic solution Place 1 drop into both eyes 2 (two) times daily.     . ondansetron (ZOFRAN ODT) 4 MG disintegrating tablet Take 1 tablet (4 mg total) by mouth every 8 (eight) hours as needed for nausea or vomiting. 30 tablet 1  . polyethylene glycol-electrolytes (TRILYTE) 420 g solution Take 4,000 mLs by mouth as directed. 4000 mL 0  . Rimegepant Sulfate (NURTEC) 75 MG TBDP Take 75 mg by mouth daily as needed (take for abortive therapy of migraine, no more than 1 tablet in 24 hours or 10 per month). 10 tablet 11  . traZODone (DESYREL) 100 MG tablet Take 1-2 tablets (100-200 mg total) by mouth at bedtime as needed for sleep. 60 tablet 5  . valACYclovir (VALTREX) 500 MG tablet Take 500 mg by mouth daily as needed (outbreak).      No facility-administered medications prior to visit.    PAST MEDICAL HISTORY: Past  Medical History:  Diagnosis Date  . Anxiety   . Binge-eating and purging type anorexia nervosa   . Bipolar disorder ([place])   . Cystitis, interstitial   . Depression   . Esophageal polyp    about 20 years ago  . GERD (gastroesophageal reflux disease)   . OCD (obsessive compulsive disorder)     PAST SURGICAL HISTORY: Past Surgical History:  Procedure Laterality Date  . BLADDER SURGERY    . CHOLECYSTECTOMY    . ESOPHAGOGASTRODUODENOSCOPY  09/28/2016   [facility]; Dr. Therisa Doyne; erosions in the esophagus, 5 cm hiatal hernia, nonbleeding erosive gastropathy s/p biopsied, normal duodenum.  Path with chronic inactive gastritis, no H. pylori or intestinal metaplasia.   Marland Kitchen ESOPHAGOGASTRODUODENOSCOPY (EGD) WITH PROPOFOL N/A 10/17/2018   Dr. Gala Romney: mild reflux esophagitis, small hiatal hernia  . VOCAL CORD LATERALIZATION, ENDOSCOPIC APPROACH W/ MLB      FAMILY HISTORY: Family History  Problem Relation Age of Onset  . Healthy Mother   . Healthy Father   . Colon cancer Neg Hx   . Colon polyps Neg Hx     SOCIAL HISTORY: Social History   Socioeconomic History  . Marital status: Married    Spouse name: Not on file  . Number of children: Not on file  . Years of education: Not on file  . Highest education level: Not on file  Occupational History  . Not on file  Tobacco Use  . Smoking status: Current Every Day Smoker    Packs/day: 0.50    Years: 20.00    Pack years: 10.00    Types: Cigarettes    Start date: 01/20/1988  . Smokeless tobacco: Never Used  Substance and Sexual Activity  . Alcohol use: No    Alcohol/week: 0.0 standard drinks  . Drug use: No  . Sexual activity: Yes    Birth control/protection: None  Other Topics Concern  . Not on file  Social History Narrative  . Not on file   Social Determinants of Health   Financial Resource Strain:   . Difficulty of Paying Living Expenses:   Food Insecurity:   . Worried About Charity fundraiser in the Last Year:   . Academic librarian in the Last Year:   Transportation Needs:   . Film/video editor (Medical):   Marland Kitchen Lack of Transportation (Non-Medical):   Physical  Activity:   . Days of Exercise per Week:   . Minutes of Exercise per Session:   Stress:   . Feeling of Stress :   Social Connections:   . Frequency of Communication with Friends and Family:   . Frequency of Social Gatherings with Friends and Family:   . Attends Religious Services:   . Active Member of Clubs or Organizations:   . Attends Archivist Meetings:   Marland Kitchen Marital Status:   Intimate Partner Violence:   . Fear of Current or Ex-Partner:   . Emotionally Abused:   Marland Kitchen Physically Abused:   . Sexually Abused:       PHYSICAL EXAM  Vitals:   05/10/19 0945  BP: 118/76  Pulse: 72  Temp: 97.6 F (36.4 C)  SpO2: 96%  Weight: 159 lb 12.8 oz (72.5 kg)  Height: 5\' 4"  (1.626 m)   Body mass index is 27.43 kg/m.  Generalized: Well developed, in no acute distress  Cardiology: normal rate and rhythm, no murmur noted Respiratory: clear to auscultation bilaterally  Neurological examination  Mentation: Alert oriented to time, place, history taking. Follows all commands speech and language fluent Cranial nerve II-XII: Pupils were equal round reactive to light. Extraocular movements were full, visual field were full on confrontational test. Facial sensation and strength were normal. Motor: The motor testing reveals 5 over 5 strength of all 4 extremities. Good symmetric motor tone is noted throughout.  Sensory: Sensory testing is intact to soft touch on all 4 extremities. No evidence of extinction is noted.  Coordination: Cerebellar testing reveals good finger-nose-finger and heel-to-shin bilaterally.  Gait and station: Gait is normal.  DIAGNOSTIC DATA (LABS, IMAGING, TESTING) - I reviewed patient records, labs, notes, testing and imaging myself where available.  No flowsheet data found.   Lab Results  Component Value Date   WBC 6.0  03/20/2019   HGB 13.4 03/20/2019   HCT 40.1 03/20/2019   MCV 91.8 03/20/2019   PLT 187 03/20/2019      Component Value Date/Time   NA 135 03/20/2019 1233   NA 135 02/06/2019 1414   K 3.4 (L) 03/20/2019 1233   CL 104 03/20/2019 1233   CO2 24 03/20/2019 1233   GLUCOSE 127 (H) 03/20/2019 1233   BUN 16 03/20/2019 1233   BUN 14 02/06/2019 1414   CREATININE 0.83 03/20/2019 1233   CREATININE 0.76 12/06/2017 1142   CALCIUM 8.9 03/20/2019 1233   PROT 7.1 03/20/2019 1233   PROT 6.8 02/06/2019 1414   ALBUMIN 4.2 03/20/2019 1233   ALBUMIN 4.6 02/06/2019 1414   AST 23 03/20/2019 1233   ALT 22 03/20/2019 1233   ALKPHOS 72 03/20/2019 1233   BILITOT 0.6 03/20/2019 1233   BILITOT 0.3 02/06/2019 1414   GFRNONAA >60 03/20/2019 1233   GFRNONAA 90 12/06/2017 1142   GFRAA >60 03/20/2019 1233   GFRAA 105 12/06/2017 1142   Lab Results  Component Value Date   CHOL 170 12/06/2015   HDL 61 12/06/2015   LDLCALC 90 12/06/2015   TRIG 95 12/06/2015   CHOLHDL 2.8 12/06/2015   Lab Results  Component Value Date   HGBA1C 5.7 (H) 03/20/2019   No results found for: VITAMINB12 Lab Results  Component Value Date   TSH 1.392 12/06/2015     ASSESSMENT AND PLAN 54 y.o. year old female  has a past medical history of Anxiety, Binge-eating and purging type anorexia nervosa, Bipolar disorder ([place]), Cystitis, interstitial, Depression, Esophageal polyp, GERD (gastroesophageal reflux disease), and OCD (  obsessive compulsive disorder). here with     ICD-10-CM   1. Intractable chronic migraine without aura and without status migrainosus  G43.719   2. Mixed bipolar I disorder ([place])  F31.60     Shinae has noted worsening headaches with 8-12 migraines per month since stopping divalproex. She admits to having more mood swings and increased stress as well as allergy symptoms and insomnia. She is doing well with as needed use of Nurtec and continue Tylenol daily. We have discussed the multiple factors contributing to  her headaches. She was educated on rebound headaches and advised to limit analgesic uses when she can. We will try Amovig for migraine prevention. We have discussed side effects of rash with Emgality. She denies facial swelling, tongue swelling or respiratory symptoms. No latex allergy. We have discussed possible side effects with Amovig as well as proper administration and storage. She will call with any concerning side effects. She will continue Nurtec for abortive therapy. She will follow up closely with PCP and psychiatry for co morbidity management. Adequate hydration, well balanced diet and regular exercise advised. She will follow up with me in 6 months, sooner if needed. She verbalizes understanding and agreement with this plan.    No orders of the defined types were placed in this encounter.    Meds ordered this encounter  Medications  . Erenumab-aooe 140 MG/ML SOAJ    Sig: Inject 140 mg into the skin every 30 (thirty) days.    Dispense:  3 pen    Refill:  3    Order Specific Question:   Supervising Provider    Answer:   Melvenia Beam [id number]      I spent 20 minutes with the patient. 50% of this time was spent counseling and educating patient on plan of care and medications.    Debbora Presto, FNP-C 05/10/2019, 10:34 AM [facility] [address] [place], [place] [address] [phone]

## 2019-05-10 NOTE — Patient Instructions (Signed)
We will try Amovig monthly. Continue Nurtec as needed for abortive therapy. Try to avoid regular use of Tylenol or Nurtec due to concerns of rebound headaches.   Continue close follow up with PCP and psychiatry for co morbidity management.   Follow up in 6 months, sooner if needed    Erenumab injection What is this medicine? ERENUMAB (e REN ue mab) is used to prevent migraine headaches. This medicine may be used for other purposes; ask your health care provider or pharmacist if you have questions. COMMON BRAND NAME(S): Aimovig What should I tell my health care provider before I take this medicine? They need to know if you have any of these conditions:  an unusual or allergic reaction to erenumab, latex, other medicines, foods, dyes, or preservatives  high blood pressure  pregnant or trying to get pregnant  breast-feeding How should I use this medicine? This medicine is for injection under the skin. You will be taught how to prepare and give this medicine. Use exactly as directed. Take your medicine at regular intervals. Do not take your medicine more often than directed. It is important that you put your used needles and syringes in a special sharps container. Do not put them in a trash can. If you do not have a sharps container, call your pharmacist or healthcare provider to get one. Talk to your pediatrician regarding the use of this medicine in children. Special care may be needed. Overdosage: If you think you have taken too much of this medicine contact a poison control center or emergency room at once. NOTE: This medicine is only for you. Do not share this medicine with others. What if I miss a dose? If you miss a dose, take it as soon as you can. If it is almost time for your next dose, take only that dose. Do not take double or extra doses. What may interact with this medicine? Interactions are not expected. This list may not describe all possible interactions. Give your  health care provider a list of all the medicines, herbs, non-prescription drugs, or dietary supplements you use. Also tell them if you smoke, drink alcohol, or use illegal drugs. Some items may interact with your medicine. What should I watch for while using this medicine? Tell your doctor or healthcare professional if your symptoms do not start to get better or if they get worse. What side effects may I notice from receiving this medicine? Side effects that you should report to your doctor or health care professional as soon as possible:  allergic reactions like skin rash, itching or hives, swelling of the face, lips, or tongue  chest pain  fast, irregular heartbeat  feeling faint or lightheaded  palpitations Side effects that usually do not require medical attention (report these to your doctor or health care professional if they continue or are bothersome):  constipation  muscle cramps  pain, redness, or irritation at site where injected This list may not describe all possible side effects. Call your doctor for medical advice about side effects. You may report side effects to FDA at 1-800-FDA-1088. Where should I keep my medicine? Keep out of the reach of children. You will be instructed on how to store this medicine. Throw away any unused medicine after the expiration date on the label. NOTE: This sheet is a summary. It may not cover all possible information. If you have questions about this medicine, talk to your doctor, pharmacist, or health care provider.  2020 Elsevier/Gold Standard (2018-05-23  15:43:58)   Migraine Headache A migraine headache is a very strong throbbing pain on one side or both sides of your head. This type of headache can also cause other symptoms. It can last from 4 hours to 3 days. Talk with your doctor about what things may bring on (trigger) this condition. What are the causes? The exact cause of this condition is not known. This condition may be  triggered or caused by:  Drinking alcohol.  Smoking.  Taking medicines, such as: ? Medicine used to treat chest pain (nitroglycerin). ? Birth control pills. ? Estrogen. ? Some blood pressure medicines.  Eating or drinking certain products.  Doing physical activity. Other things that may trigger a migraine headache include:  Having a menstrual period.  Pregnancy.  Hunger.  Stress.  Not getting enough sleep or getting too much sleep.  Weather changes.  Tiredness (fatigue). What increases the risk?  Being 37-43 years old.  Being female.  Having a family history of migraine headaches.  Being Caucasian.  Having depression or anxiety.  Being very overweight. What are the signs or symptoms?  A throbbing pain. This pain may: ? Happen in any area of the head, such as on one side or both sides. ? Make it hard to do daily activities. ? Get worse with physical activity. ? Get worse around bright lights or loud noises.  Other symptoms may include: ? Feeling sick to your stomach (nauseous). ? Vomiting. ? Dizziness. ? Being sensitive to bright lights, loud noises, or smells.  Before you get a migraine headache, you may get warning signs (an aura). An aura may include: ? Seeing flashing lights or having blind spots. ? Seeing bright spots, halos, or zigzag lines. ? Having tunnel vision or blurred vision. ? Having numbness or a tingling feeling. ? Having trouble talking. ? Having weak muscles.  Some people have symptoms after a migraine headache (postdromal phase), such as: ? Tiredness. ? Trouble thinking (concentrating). How is this treated?  Taking medicines that: ? Relieve pain. ? Relieve the feeling of being sick to your stomach. ? Prevent migraine headaches.  Treatment may also include: ? Having acupuncture. ? Avoiding foods that bring on migraine headaches. ? Learning ways to control your body functions (biofeedback). ? Therapy to help you know and  deal with negative thoughts (cognitive behavioral therapy). Follow these instructions at home: Medicines  Take over-the-counter and prescription medicines only as told by your doctor.  Ask your doctor if the medicine prescribed to you: ? Requires you to avoid driving or using heavy machinery. ? Can cause trouble pooping (constipation). You may need to take these steps to prevent or treat trouble pooping:  Drink enough fluid to keep your pee (urine) pale yellow.  Take over-the-counter or prescription medicines.  Eat foods that are high in fiber. These include beans, whole grains, and fresh fruits and vegetables.  Limit foods that are high in fat and sugar. These include fried or sweet foods. Lifestyle  Do not drink alcohol.  Do not use any products that contain nicotine or tobacco, such as cigarettes, e-cigarettes, and chewing tobacco. If you need help quitting, ask your doctor.  Get at least 8 hours of sleep every night.  Limit and deal with stress. General instructions      Keep a journal to find out what may bring on your migraine headaches. For example, write down: ? What you eat and drink. ? How much sleep you get. ? Any change in what you eat  or drink. ? Any change in your medicines.  If you have a migraine headache: ? Avoid things that make your symptoms worse, such as bright lights. ? It may help to lie down in a dark, quiet room. ? Do not drive or use heavy machinery. ? Ask your doctor what activities are safe for you.  Keep all follow-up visits as told by your doctor. This is important. Contact a doctor if:  You get a migraine headache that is different or worse than others you have had.  You have more than 15 headache days in one month. Get help right away if:  Your migraine headache gets very bad.  Your migraine headache lasts longer than 72 hours.  You have a fever.  You have a stiff neck.  You have trouble seeing.  Your muscles feel weak or  like you cannot control them.  You start to lose your balance a lot.  You start to have trouble walking.  You pass out (faint).  You have a seizure. Summary  A migraine headache is a very strong throbbing pain on one side or both sides of your head. These headaches can also cause other symptoms.  This condition may be treated with medicines and changes to your lifestyle.  Keep a journal to find out what may bring on your migraine headaches.  Contact a doctor if you get a migraine headache that is different or worse than others you have had.  Contact your doctor if you have more than 15 headache days in a month. This information is not intended to replace advice given to you by your health care provider. Make sure you discuss any questions you have with your health care provider. Document Revised: 04/29/2018 Document Reviewed: 02/17/2018 Elsevier Patient Education  [place].   Analgesic Rebound Headache An analgesic rebound headache, sometimes called a medication overuse headache, is a headache that comes after pain medicine (analgesic) taken to treat the original (primary) headache has worn off. Any type of primary headache can return as a rebound headache if a person regularly takes analgesics more than three times a week to treat it. The types of primary headaches that are commonly associated with rebound headaches include:  Migraines.  Headaches that arise from tense muscles in the head and neck area (tension headaches).  Headaches that develop and happen again (recur) on one side of the head and around the eye (cluster headaches). If rebound headaches continue, they become chronic daily headaches. What are the causes? This condition may be caused by frequent use of:  Over-the-counter medicines such as aspirin, ibuprofen, and acetaminophen.  Sinus relief medicines and other medicines that contain caffeine.  Narcotic pain medicines such as codeine and  oxycodone. What are the signs or symptoms? The symptoms of a rebound headache are the same as the symptoms of the original headache. Some of the symptoms of specific types of headaches include: Migraine headache  Pulsing or throbbing pain on one or both sides of the head.  Severe pain that interferes with daily activities.  Pain that is worsened by physical activity.  Nausea, vomiting, or both.  Pain with exposure to bright light, loud noises, or strong smells.  General sensitivity to bright light, loud noises, or strong smells.  Visual changes.  Numbness of one or both arms. Tension headache  Pressure around the head.  Dull, aching head pain.  Pain felt over the front and sides of the head.  Tenderness in the muscles of the head,  neck, and shoulders. Cluster headache  Severe pain that begins in or around one eye or temple.  Redness and tearing in the eye on the same side as the pain.  Droopy or swollen eyelid.  One-sided head pain.  Nausea.  Runny nose.  Sweaty, pale facial skin.  Restlessness. How is this diagnosed? This condition is diagnosed by:  Reviewing your medical history. This includes the nature of your primary headaches.  Reviewing the types of pain medicines that you have been using to treat your headaches and how often you take them. How is this treated? This condition may be treated or managed by:  Discontinuing frequent use of the analgesic medicine. Doing this may worsen your headaches at first, but the pain should eventually become more manageable, less frequent, and less severe.  Seeing a headache specialist. He or she may be able to help you manage your headaches and help make sure there is not another cause of the headaches.  Using methods of stress relief, such as acupuncture, counseling, biofeedback, and massage. Talk with your health care provider about which methods might be good for you. Follow these instructions at home:  Take  over-the-counter and prescription medicines only as told by your health care provider.  Stop the repeated use of pain medicine as told by your health care provider. Stopping can be difficult. Carefully follow instructions from your health care provider.  Avoid triggers that are known to cause your primary headaches.  Keep all follow-up visits as told by your health care provider. This is important. Contact a health care provider if:  You continue to experience headaches after following treatments that your health care provider recommended. Get help right away if:  You develop new headache pain.  You develop headache pain that is different than what you have experienced in the past.  You develop numbness or tingling in your arms or legs.  You develop changes in your speech or vision. This information is not intended to replace advice given to you by your health care provider. Make sure you discuss any questions you have with your health care provider. Document Revised: 12/18/2016 Document Reviewed: 06/10/2015 Elsevier Patient Education  2020 [employer].

## 2019-05-10 NOTE — Telephone Encounter (Signed)
Approved already. PA Case: [id number], Status: Approved, Coverage Starts on: 05/10/2019 12:00:00 AM, Coverage Ends on: 08/08/2019 12:00:00 AM. Questions? Contact [phone].

## 2019-05-11 ENCOUNTER — Ambulatory Visit: Payer: Medicare PPO | Admitting: Psychiatry

## 2019-05-16 ENCOUNTER — Ambulatory Visit: Payer: Medicare PPO | Admitting: Family Medicine

## 2019-05-17 ENCOUNTER — Telehealth: Payer: Self-pay | Admitting: Physician Assistant

## 2019-05-17 NOTE — Telephone Encounter (Signed)
Jennifer [name] is interested in the results of her gene test. It is in your box for review.

## 2019-05-18 NOTE — Telephone Encounter (Signed)
Left detailed message with information and to call back with any further questions or concerns. If not her apt is 05/30/2019

## 2019-05-18 NOTE — Telephone Encounter (Signed)
Please let her know I reviewed the gene site testing.  I will go over it with her in detail when I see her but the results are good and that the medications she is on right now should be working well at their usual doses.  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  She will need to add Cerefolin NAC.

## 2019-05-24 ENCOUNTER — Other Ambulatory Visit: Payer: Medicare PPO

## 2019-05-25 ENCOUNTER — Ambulatory Visit: Payer: Medicare PPO | Admitting: Psychiatry

## 2019-05-30 ENCOUNTER — Encounter: Payer: Self-pay | Admitting: Physician Assistant

## 2019-05-30 ENCOUNTER — Telehealth (INDEPENDENT_AMBULATORY_CARE_PROVIDER_SITE_OTHER): Payer: Medicare PPO | Admitting: Physician Assistant

## 2019-05-30 ENCOUNTER — Telehealth: Payer: Self-pay | Admitting: Physician Assistant

## 2019-05-30 DIAGNOSIS — F502 Bulimia nervosa: Secondary | ICD-10-CM | POA: Diagnosis not present

## 2019-05-30 DIAGNOSIS — F4321 Adjustment disorder with depressed mood: Secondary | ICD-10-CM | POA: Diagnosis not present

## 2019-05-30 DIAGNOSIS — F319 Bipolar disorder, unspecified: Secondary | ICD-10-CM

## 2019-05-30 DIAGNOSIS — F429 Obsessive-compulsive disorder, unspecified: Secondary | ICD-10-CM | POA: Diagnosis not present

## 2019-05-30 DIAGNOSIS — F411 Generalized anxiety disorder: Secondary | ICD-10-CM | POA: Diagnosis not present

## 2019-05-30 DIAGNOSIS — F3132 Bipolar disorder, current episode depressed, moderate: Secondary | ICD-10-CM | POA: Diagnosis not present

## 2019-05-30 MED ORDER — FLUVOXAMINE MALEATE 100 MG PO TABS: 300.0000 mg | ORAL_TABLET | Freq: Every day | ORAL | 1 refills | Status: DC

## 2019-05-30 MED ORDER — LURASIDONE HCL 80 MG PO TABS: 80.0000 mg | ORAL_TABLET | Freq: Every day | ORAL | 5 refills | Status: DC

## 2019-05-30 MED ORDER — GABAPENTIN 600 MG PO TABS: 1200.0000 mg | ORAL_TABLET | Freq: Two times a day (BID) | ORAL | 5 refills | Status: DC

## 2019-05-30 NOTE — Progress Notes (Signed)
[facility]  Patient ID: [name],  MRN: [id number]  PCP: Maryruth Hancock, MD  Date of Evaluation: 05/02/2019 Time spent:40 minutes  Chief Complaint:  Chief Complaint    Follow-up     Virtual Visit via Telephone/Video Note  I connected with patient by a video enabled telemedicine application or telephone, with their informed consent, and verified patient privacy and that I am speaking with the correct person using two identifiers.  I am private, in my office and the patient is at home.  I discussed the limitations, risks, security and privacy concerns of performing an evaluation and management service by telephone and video and the availability of in person appointments. I also discussed with the patient that there may be a patient responsible charge related to this service. The patient expressed understanding and agreed to proceed.   I discussed the assessment and treatment plan with the patient. The patient was provided an opportunity to ask questions and all were answered. The patient agreed with the plan and demonstrated an understanding of the instructions.   The patient was advised to call back or seek an in-person evaluation if the symptoms worsen or if the condition fails to improve as anticipated.  I provided 40 minutes of non-face-to-face time during this encounter.  HISTORY/CURRENT STATUS: HPI For routine med check.  Jennifer [name] has had a rough couple of weeks.  Her father-in-law died last week.  That has been difficult because of grieving, and even though she is not really having any new symptoms with her mental health, it has seemed to magnify the symptoms some.  We increased the Latuda a month ago and she has tolerated it pretty well this time.  She still having a lot of mood swings though.  More so on the depressed side.  Again, some of that is due to grief.  She is sad a lot.  Energy and motivation are fair to good, depending on the day and what is going on.   She does cry easily.  She sometimes isolates.  Does not enjoy things at times but other times she does.  No SI/HI.  Patient denies increased energy with decreased need for sleep, no increased talkativeness, no racing thoughts, no impulsivity or risky behaviors, no increased spending, no increased libido, no grandiosity, she does get really irritable at times but never knows when it is going to happen.  No hallucinations.  Gene site testing was performed at the last visit.  She would like to discuss the results today.  Has not been taking the trazodone as much because she felt like at 2 pills it caused too much sedation but at one pill it does not really help as much.  She is always had problems with nocturia as well and it does not seem that the trazodone has anything to do with that, but she does want to be able to wake up to go to the bathroom if needed.  She feels that the depression is worse when she does not take the trazodone however.  She has seen a new counselor now for a couple of sessions.  She feels like she will get good advice from her.  She does get anxious but is always hesitant to take the Klonopin too much.  It does help when she needs it really bad though.  For the most part, the gabapentin has helped with the anxiety some.  And the hydroxyzine does help for rescue when she is having more mild anxiety.  The hydroxyzine does put her to sleep sometimes.  Patient states she has purged a couple of times since the last visit.  She is talking about that with her new counselor.  Denies dizziness, syncope, seizures, numbness, tingling, tremor, tics, unsteady gait, slurred speech, confusion. Denies muscle or joint pain, stiffness, or dystonia.Denies unexplained weight loss, frequent infections, or sores that heal slowly.  No polyphagia, polydipsia, or polyuria. Denies visual changes or paresthesias.   Individual Medical History/ Review of Systems: Changes? :No    Past medications for  mental health diagnoses include: Risperdal, Seroquel, Prozac, Zoloft, Wellbutrin, Lamictal, Depakote caused hair loss,Xanax, Ambien, trazodone, Trileptal, Luvox, Topamax, Elavil, Pamelor, BuSpar, doxazosin, prazosin, lithium, Vraylar, [name], Latuda >60 mg caused abd pain and nausea  Allergies: Codeine, Sonata [zaleplon], Meloxicam, Other, Topamax [topiramate], Emgality [galcanezumab-gnlm], and Penicillins  Current Medications:  Current Outpatient Medications:  .  acetaminophen (TYLENOL) 500 MG tablet, Take 1,000 mg by mouth every 6 (six) hours as needed for moderate pain or headache., Disp: , Rfl:  .  albuterol (VENTOLIN HFA) 108 (90 Base) MCG/ACT inhaler, Inhale 1-2 puffs into the lungs every 6 (six) hours as needed for wheezing or shortness of breath., Disp: 18 g, Rfl: 0 .  clonazePAM (KLONOPIN) 0.5 MG tablet, Take 1 tablet (0.5 mg total) by mouth 2 (two) times daily as needed for anxiety., Disp: 20 tablet, Rfl: 5 .  cyclobenzaprine (FLEXERIL) 10 MG tablet, 1/2 po at bedtime for TMJ (Patient taking differently: Take 5 mg by mouth 3 times/day as needed-between meals & bedtime (temporomandibular). 1/2 po at bedtime for TMJ), Disp: 30 tablet, Rfl: 0 .  cycloSPORINE (RESTASIS) 0.05 % ophthalmic emulsion, Place 1 drop into both eyes 2 (two) times daily. , Disp: , Rfl:  .  diclofenac Sodium (VOLTAREN) 1 % GEL, Apply 1 application topically as needed. , Disp: , Rfl:  .  dicyclomine (BENTYL) 20 MG tablet, Take 1 tablet (20 mg total) by mouth 3 (three) times daily as needed for spasms (abdominal cramping)., Disp: 20 tablet, Rfl: 0 .  Erenumab-aooe 140 MG/ML SOAJ, Inject 140 mg into the skin every 30 (thirty) days., Disp: 3 pen, Rfl: 3 .  famotidine (PEPCID) 40 MG tablet, Take 1 tablet (40 mg total) by mouth 2 (two) times daily. For 12 weeks, then go back to 20mg  twice daily., Disp: 60 tablet, Rfl: 3 .  fluticasone (FLONASE) 50 MCG/ACT nasal spray, Place 1 spray into both nostrils daily as needed for  allergies. , Disp: , Rfl:  .  fluvoxaMINE (LUVOX) 100 MG tablet, Take 3 tablets (300 mg total) by mouth at bedtime., Disp: 270 tablet, Rfl: 1 .  gabapentin (NEURONTIN) 600 MG tablet, Take 2 tablets (1,200 mg total) by mouth 2 (two) times daily., Disp: 120 tablet, Rfl: 5 .  hydrOXYzine (ATARAX/VISTARIL) 50 MG tablet, 1-2 po q6hr prn (Patient taking differently: Take 50-100 mg by mouth every 6 (six) hours as needed for anxiety. ), Disp: 90 tablet, Rfl: 5 .  hyoscyamine (LEVSIN) 0.125 MG tablet, Take 1 tablet (0.125 mg total) by mouth every 4 (four) hours as needed (diarrhea)., Disp: 120 tablet, Rfl: 1 .  lidocaine-prilocaine (EMLA) cream, , Disp: , Rfl:  .  loratadine (CLARITIN) 10 MG tablet, Take 10 mg by mouth daily., Disp: , Rfl:  .  metroNIDAZOLE (METROGEL) 1 % gel, Apply topically daily., Disp: 45 g, Rfl: 0 .  olopatadine (PATANOL) 0.1 % ophthalmic solution, Place 1 drop into both eyes 2 (two) times daily. , Disp: , Rfl:  .  ondansetron (ZOFRAN ODT) 4 MG disintegrating tablet, Take 1 tablet (4 mg total) by mouth every 8 (eight) hours as needed for nausea or vomiting., Disp: 30 tablet, Rfl: 1 .  polyethylene glycol-electrolytes (TRILYTE) 420 g solution, Take 4,000 mLs by mouth as directed., Disp: 4000 mL, Rfl: 0 .  Rimegepant Sulfate (NURTEC) 75 MG TBDP, Take 75 mg by mouth daily as needed (take for abortive therapy of migraine, no more than 1 tablet in 24 hours or 10 per month)., Disp: 10 tablet, Rfl: 11 .  traZODone (DESYREL) 100 MG tablet, Take 1-2 tablets (100-200 mg total) by mouth at bedtime as needed for sleep., Disp: 60 tablet, Rfl: 5 .  valACYclovir (VALTREX) 500 MG tablet, Take 500 mg by mouth daily as needed (outbreak). , Disp: , Rfl:  .  lurasidone (LATUDA) 80 MG TABS tablet, Take 1 tablet (80 mg total) by mouth daily with breakfast., Disp: 30 tablet, Rfl: 5 Medication Side Effects: none  Family Medical/ Social History: Changes? No  MENTAL HEALTH EXAM:  Last menstrual period  10/07/2011.There is no height or weight on file to calculate BMI.  General Appearance: Casual, Neat and Well Groomed  Eye Contact:  Good  Speech:  Clear and Coherent and Normal Rate  Volume:  Normal  Mood:  Euthymic  Affect:  Appropriate  Thought Process:  Goal Directed and Descriptions of Associations: Intact  Orientation:  Full (Time, Place, and Person)  Thought Content: Logical   Suicidal Thoughts:  No  Homicidal Thoughts:  No  Memory:  WNL  Judgement:  Good  Insight:  Good  Psychomotor Activity:  Normal  Concentration:  Concentration: Good  Recall:  Good  Fund of Knowledge: Good  Language: Good  Assets:  Desire for Improvement  ADL's:  Intact  Cognition: WNL  Prognosis:  Good    DIAGNOSES:    ICD-10-CM   1. Bipolar I disorder ([place])  F31.9   2. Obsessive-compulsive disorder, unspecified type  F42.9   3. Grieving  F43.21   4. Bulimia  F50.2   5. Generalized anxiety disorder  F41.1     Receiving Psychotherapy: Yes Dede Query, LCSW   RECOMMENDATIONS:  PDMP reviewed.  I spent 40 mins w/ her.  We discussed the GeneSight pharmacodynamic test results.  As far as the antidepressant category goes, I will of the current commonly used antidepressants are in the "use as directed" box with the exception of Paxil which may have reduced efficacy.  For anxiolytics and hypnotics, all medications typically used for anxiety are" uses directive box".  Same for all antipsychotics and for mood stabilizers with 1 exception of Tegretol that shows a possible increased risk of side effects with Tegretol.  I explained the results of these which unfortunately, do not tell me the information I would like to know, which is quite drug to use and the dose that would be effective for her.  At least we have ruled out any fact that all these drugs would need different doses possibly because of her genetic make-up and metabolism.  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  She does need to take Cerefolin NAC or Deplin.  I believe that is in process of being ordered.  That is considered a medical food so is not a prescription.  All questions were answered to her satisfaction.  Leyda and I talked about the Anafranil.  It is in the category that  should be effective.  She has told me numerous times that that was the best drug if she ever took to help with the OCD, anxiety, and depression.  It was stopped a few years ago, when she was admitted inpatient I believe in 2017, due to the concern of elevated liver function test.  Her LFTs have been normal many times since then and she has been nervous about restarting it.  She had normal LFTs approximately 2 months ago.  If she is not better after this next month after taking the trazodone 150 mg routinely, we really should consider changing back to Anafranil.  She states she would be willing.  We discussed the binge/purge cycle and I have recommended she discuss this further with Dede Query, who she has an appointment with this afternoon.  Continue Latuda 80 mg q evening with food.  Continue Klonopin 0.5 mg, 1 p.o. twice daily as needed. Continue Luvox 100 mg, 3 p.o. nightly. Continue gabapentin 600 mg, 2 p.o. twice daily. Continue hydroxyzine 50 mg, 1-2 every 6 hours as needed.  She can call and we will give a lower dose if needed. Continue trazodone 100 mg, 1.5 pills nightly.   Continue therapy. Return in 4 weeks.  [name], PA-C

## 2019-05-30 NOTE — Telephone Encounter (Signed)
Telehealth consent given by pt.

## 2019-06-02 ENCOUNTER — Telehealth: Payer: Self-pay | Admitting: Physician Assistant

## 2019-06-02 NOTE — Telephone Encounter (Signed)
Pt called asking for return call about an Rx for Duplin and another med in providers notes from 05/30/19 appt. Contact @ [phone]

## 2019-06-05 ENCOUNTER — Other Ambulatory Visit: Payer: Self-pay | Admitting: Physician Assistant

## 2019-06-05 MED ORDER — DEPLIN 15 15-90.314 MG PO CAPS: 15.0000 mg | ORAL_CAPSULE | Freq: Every day | ORAL | 3 refills | Status: DC

## 2019-06-05 NOTE — Telephone Encounter (Signed)
I sent it in to [facility]

## 2019-06-08 ENCOUNTER — Ambulatory Visit: Payer: Medicare PPO | Admitting: Psychiatry

## 2019-06-14 ENCOUNTER — Other Ambulatory Visit: Payer: Medicare PPO

## 2019-06-15 ENCOUNTER — Telehealth: Payer: Self-pay | Admitting: Physician Assistant

## 2019-06-15 NOTE — Telephone Encounter (Signed)
Patient called and left a message stating that she has been on deplin for three days and she is having bad headache ever since she started taking the medicine. They are turning into migraines. And she takes amovig shots for migraines and since she has been doing the shots she has not had any migraines. Please call with a different solution. [phone]

## 2019-06-16 NOTE — Telephone Encounter (Signed)
Have her stop the Deplin.  We can discuss other options at her appointment next week.  Have her keep a headache diary until I see her.  Have her note the timing headache starts ,intensity, throbbing, aching, photo or phono phobia, nausea vomiting, etc.  Thanks.

## 2019-06-16 NOTE — Telephone Encounter (Signed)
Patient aware to stop Deplin. She does keep a log of her headaches from her doctor that prescribes Amovig. Will discuss at apt on 06/01

## 2019-06-20 ENCOUNTER — Encounter: Payer: Self-pay | Admitting: Physician Assistant

## 2019-06-20 ENCOUNTER — Telehealth (INDEPENDENT_AMBULATORY_CARE_PROVIDER_SITE_OTHER): Payer: Medicare PPO | Admitting: Physician Assistant

## 2019-06-20 DIAGNOSIS — F411 Generalized anxiety disorder: Secondary | ICD-10-CM | POA: Diagnosis not present

## 2019-06-20 DIAGNOSIS — G4700 Insomnia, unspecified: Secondary | ICD-10-CM | POA: Diagnosis not present

## 2019-06-20 DIAGNOSIS — F3132 Bipolar disorder, current episode depressed, moderate: Secondary | ICD-10-CM | POA: Diagnosis not present

## 2019-06-20 DIAGNOSIS — F429 Obsessive-compulsive disorder, unspecified: Secondary | ICD-10-CM | POA: Diagnosis not present

## 2019-06-20 MED ORDER — HYDROXYZINE HCL 25 MG PO TABS: 25.0000 mg | ORAL_TABLET | Freq: Three times a day (TID) | ORAL | 1 refills | Status: DC | PRN

## 2019-06-20 MED ORDER — REXULTI 3 MG PO TABS: 3.0000 mg | ORAL_TABLET | Freq: Every morning | ORAL | 1 refills | Status: DC

## 2019-06-20 NOTE — Patient Instructions (Addendum)
Wean off Latuda as follows ; Latuda 60 mg for 4 days, then 1/2 pill for 4 days and then stop. At the same time, start [place].

## 2019-06-20 NOTE — Progress Notes (Signed)
[facility]  Patient ID: Jennifer [name],  MRN: [id number]  PCP: Maryruth Hancock, MD  Date of Evaluation: 05/02/2019 Time spent:30 minutes  Chief Complaint:  Chief Complaint    Follow-up     Virtual Visit via Telephone/Video Note  I connected with patient by a video enabled telemedicine application or telephone, with their informed consent, and verified patient privacy and that I am speaking with the correct person using two identifiers.  I am private, in my office and the patient is at home.  I discussed the limitations, risks, security and privacy concerns of performing an evaluation and management service by telephone and video and the availability of in person appointments. I also discussed with the patient that there may be a patient responsible charge related to this service. The patient expressed understanding and agreed to proceed.   I discussed the assessment and treatment plan with the patient. The patient was provided an opportunity to ask questions and all were answered. The patient agreed with the plan and demonstrated an understanding of the instructions.   The patient was advised to call back or seek an in-person evaluation if the symptoms worsen or if the condition fails to improve as anticipated.  I provided 30 minutes of non-face-to-face time during this encounter.  HISTORY/CURRENT STATUS: HPI For routine med check.  She is sad a lot.  Energy and motivation are fair to good, depending on the day and what is going on.  She does cry easily.  She sometimes isolates.  Does not enjoy things at times but other times she does. Is overwhelmed sometimes b/c she feels so down, and 'nothing ever helps.'  No SI/HI.  She is on weight watchers and has lost about 30 pounds.  She walks her dog several times a day, and is eating much healthier.  States she does not want any medication that will make her more hungry and cause weight gain.  Patient denies increased energy  with decreased need for sleep, no increased talkativeness, no racing thoughts, no impulsivity or risky behaviors, no increased spending, no increased libido, no grandiosity, she does get really irritable at times but never knows when it is going to happen.  No hallucinations.  She does get anxious but is always hesitant to take the Klonopin too much.  It does help when she needs it really bad though.  For the most part, the gabapentin has helped with the anxiety some.  And the hydroxyzine does help for rescue when she is having more mild anxiety, but it does cause excessive drowsiness.  Denies dizziness, syncope, seizures, numbness, tingling, tremor, tics, unsteady gait, slurred speech, confusion. Denies muscle or joint pain, stiffness, or dystonia.Denies unexplained weight loss, frequent infections, or sores that heal slowly.  No polyphagia, polydipsia, or polyuria. Denies visual changes or paresthesias.   Individual Medical History/ Review of Systems: Changes? :No    Past medications for mental health diagnoses include: Risperdal, Seroquel, Prozac, Zoloft, Wellbutrin, Lamictal, Depakote caused hair loss,Xanax, Ambien, trazodone, Trileptal, Luvox, Topamax, Elavil, Pamelor, BuSpar, doxazosin, prazosin, lithium, Vraylar, [name], Latuda >60 mg caused abd pain and nausea  Allergies: Codeine, Sonata [zaleplon], Meloxicam, Other, Topamax [topiramate], Emgality [galcanezumab-gnlm], and Penicillins  Current Medications:  Current Outpatient Medications:  .  acetaminophen (TYLENOL) 500 MG tablet, Take 1,000 mg by mouth every 6 (six) hours as needed for moderate pain or headache., Disp: , Rfl:  .  albuterol (VENTOLIN HFA) 108 (90 Base) MCG/ACT inhaler, Inhale 1-2 puffs into the lungs every  6 (six) hours as needed for wheezing or shortness of breath., Disp: 18 g, Rfl: 0 .  clonazePAM (KLONOPIN) 0.5 MG tablet, Take 1 tablet (0.5 mg total) by mouth 2 (two) times daily as needed for anxiety., Disp: 20 tablet,  Rfl: 5 .  cycloSPORINE (RESTASIS) 0.05 % ophthalmic emulsion, Place 1 drop into both eyes 2 (two) times daily. , Disp: , Rfl:  .  diclofenac Sodium (VOLTAREN) 1 % GEL, Apply 1 application topically as needed. , Disp: , Rfl:  .  dicyclomine (BENTYL) 20 MG tablet, Take 1 tablet (20 mg total) by mouth 3 (three) times daily as needed for spasms (abdominal cramping)., Disp: 20 tablet, Rfl: 0 .  Erenumab-aooe 140 MG/ML SOAJ, Inject 140 mg into the skin every 30 (thirty) days., Disp: 3 pen, Rfl: 3 .  famotidine (PEPCID) 40 MG tablet, Take 1 tablet (40 mg total) by mouth 2 (two) times daily. For 12 weeks, then go back to 20mg  twice daily., Disp: 60 tablet, Rfl: 3 .  fluticasone (FLONASE) 50 MCG/ACT nasal spray, Place 1 spray into both nostrils daily as needed for allergies. , Disp: , Rfl:  .  fluvoxaMINE (LUVOX) 100 MG tablet, Take 3 tablets (300 mg total) by mouth at bedtime., Disp: 270 tablet, Rfl: 1 .  gabapentin (NEURONTIN) 600 MG tablet, Take 2 tablets (1,200 mg total) by mouth 2 (two) times daily., Disp: 120 tablet, Rfl: 5 .  hyoscyamine (LEVSIN) 0.125 MG tablet, Take 1 tablet (0.125 mg total) by mouth every 4 (four) hours as needed (diarrhea)., Disp: 120 tablet, Rfl: 1 .  lidocaine-prilocaine (EMLA) cream, , Disp: , Rfl:  .  loratadine (CLARITIN) 10 MG tablet, Take 10 mg by mouth daily., Disp: , Rfl:  .  lurasidone (LATUDA) 80 MG TABS tablet, Take 1 tablet (80 mg total) by mouth daily with breakfast., Disp: 30 tablet, Rfl: 5 .  metroNIDAZOLE (METROGEL) 1 % gel, Apply topically daily., Disp: 45 g, Rfl: 0 .  ondansetron (ZOFRAN ODT) 4 MG disintegrating tablet, Take 1 tablet (4 mg total) by mouth every 8 (eight) hours as needed for nausea or vomiting., Disp: 30 tablet, Rfl: 1 .  polyethylene glycol-electrolytes (TRILYTE) 420 g solution, Take 4,000 mLs by mouth as directed., Disp: 4000 mL, Rfl: 0 .  Rimegepant Sulfate (NURTEC) 75 MG TBDP, Take 75 mg by mouth daily as needed (take for abortive therapy of  migraine, no more than 1 tablet in 24 hours or 10 per month)., Disp: 10 tablet, Rfl: 11 .  traZODone (DESYREL) 100 MG tablet, Take 1-2 tablets (100-200 mg total) by mouth at bedtime as needed for sleep., Disp: 60 tablet, Rfl: 5 .  valACYclovir (VALTREX) 500 MG tablet, Take 500 mg by mouth daily as needed (outbreak). , Disp: , Rfl:  .  Brexpiprazole ([name]) 3 MG TABS, Take 1 tablet (3 mg total) by mouth in the morning., Disp: 30 tablet, Rfl: 1 .  cyclobenzaprine (FLEXERIL) 10 MG tablet, 1/2 po at bedtime for TMJ (Patient not taking: Reported on 06/20/2019), Disp: 30 tablet, Rfl: 0 .  hydrOXYzine (ATARAX/VISTARIL) 25 MG tablet, Take 1 tablet (25 mg total) by mouth 3 (three) times daily as needed., Disp: 60 tablet, Rfl: 1 .  olopatadine (PATANOL) 0.1 % ophthalmic solution, Place 1 drop into both eyes 2 (two) times daily. , Disp: , Rfl:  Medication Side Effects: none  Family Medical/ Social History: Changes? No  MENTAL HEALTH EXAM:  Last menstrual period 10/07/2011.There is no height or weight on file to calculate BMI.  General Appearance: Casual, Neat and Well Groomed  Eye Contact:  Good  Speech:  Clear and Coherent and Normal Rate  Volume:  Normal  Mood:  Euthymic  Affect:  Appropriate  Thought Process:  Goal Directed and Descriptions of Associations: Intact  Orientation:  Full (Time, Place, and Person)  Thought Content: Logical   Suicidal Thoughts:  No  Homicidal Thoughts:  No  Memory:  WNL  Judgement:  Good  Insight:  Good  Psychomotor Activity:  Normal  Concentration:  Concentration: Good  Recall:  Good  Fund of Knowledge: Good  Language: Good  Assets:  Desire for Improvement  ADL's:  Intact  Cognition: WNL  Prognosis:  Good    DIAGNOSES:    ICD-10-CM   1. Bipolar affective disorder, currently depressed, moderate ([place])  F31.32   2. Obsessive-compulsive disorder, unspecified type  F42.9   3. Generalized anxiety disorder  F41.1   4. Insomnia, unspecified type  G47.00      Receiving Psychotherapy: Yes Dede Query, LCSW   RECOMMENDATIONS:  PDMP reviewed.  I spent 30 mins w/ her. We again talked about the Anafranil.  She has told me numerous times that that was the best drug if she ever took to help with the OCD, anxiety, and depression.  It was stopped a few years ago, when she was admitted inpatient I believe in 2017, due to the concern of elevated liver function test.  Her LFTs have been normal many times since then and she has been nervous about restarting it.  She had normal LFTs approximately 3 months ago.  She would be willing to retry it if I think it would be beneficial.  Of course we would watch her LFTs closely if we choose to do that in the future.  I would like for [place] to retry [name] before we change to the Anafranil just because the complexity of switching Luvox and probably trazodone as well. Wean off Latuda as follows: Take 60 mg 1 p.o. nightly for 4 days, then one half p.o. nightly for 4 days and then stop. Start [name] 3 mg, 1 p.o. every morning, while weaning off the [place]. continue Klonopin 0.5 mg, 1 p.o. twice daily as needed. Continue Luvox 100 mg, 3 p.o. nightly. Continue gabapentin 600 mg, 2 p.o. twice daily. Decrease hydroxyzine to 25 mg, 1/2-1 every 8 hours as needed anxiety. Continue trazodone 100 mg, 1.5 pills nightly.   Continue therapy. Return in 4 weeks.  [name], PA-C

## 2019-06-21 ENCOUNTER — Ambulatory Visit
Admission: RE | Admit: 2019-06-21 | Discharge: 2019-06-21 | Disposition: A | Discharge status: Home / Self Care | Payer: Medicare PPO | Source: Ambulatory Visit | Attending: Orthopedic Surgery | Admitting: Orthopedic Surgery

## 2019-06-21 DIAGNOSIS — M24139 Other articular cartilage disorders, unspecified wrist: Secondary | ICD-10-CM

## 2019-06-21 DIAGNOSIS — M25532 Pain in left wrist: Secondary | ICD-10-CM | POA: Diagnosis not present

## 2019-06-21 MED ORDER — IOPAMIDOL (ISOVUE-M 200) INJECTION 41%
3.0000 mL | Freq: Once | INTRAMUSCULAR | Status: AC
  Administered 2019-06-21: 3 mL via INTRA_ARTICULAR

## 2019-06-22 ENCOUNTER — Telehealth: Payer: Self-pay

## 2019-06-22 DIAGNOSIS — F3132 Bipolar disorder, current episode depressed, moderate: Secondary | ICD-10-CM | POA: Diagnosis not present

## 2019-06-22 NOTE — Telephone Encounter (Signed)
Prior authorization submitted and DENIED for Rexult 3 mg with [facility] [id number] they require she tried Aripiprazole first. (not listed in tried meds) If not appropriate we need to send a letter stating it's for medical necessity for her.

## 2019-06-22 NOTE — Telephone Encounter (Signed)
Start Abilify 5 mg q am.  Need pharm please.

## 2019-06-23 NOTE — Telephone Encounter (Signed)
Ok, let me know if I need to do anything.

## 2019-06-25 ENCOUNTER — Encounter: Payer: Self-pay | Admitting: Family Medicine

## 2019-06-26 NOTE — Telephone Encounter (Signed)
Contacted [employer]. At [facility] [phone], initiated an appeal. Given all information over the phone and past medications tried. Representative said it should take up to 56 hours for a response. Patient has samples at this time.   F. [phone] Ref# [id number]

## 2019-06-27 ENCOUNTER — Ambulatory Visit (INDEPENDENT_AMBULATORY_CARE_PROVIDER_SITE_OTHER): Payer: Medicare PPO | Admitting: Plastic Surgery

## 2019-06-27 ENCOUNTER — Encounter: Payer: Self-pay | Admitting: Plastic Surgery

## 2019-06-27 ENCOUNTER — Other Ambulatory Visit: Payer: Self-pay

## 2019-06-27 DIAGNOSIS — L819 Disorder of pigmentation, unspecified: Secondary | ICD-10-CM | POA: Diagnosis not present

## 2019-06-27 NOTE — Progress Notes (Addendum)
Patient ID: Jennifer [name], female    DOB: [birth year], 54 y.o.   MRN: [id number]   Chief Complaint  Patient presents with  . Consult  . Skin Problem    54 year old female here for evaluation of a changing skin lesion on her nose.  The patient states she has had it excised in the past she also had it lasered a couple of years ago.  She has noticed it has come back.  She is not sure if it was biopsied or what the results showed if it was.  She does not have a history of skin cancer.  The lesion is quite irregular in both its shape and color.  It is hyperpigmented.  It is about 1-1/2 cm in size.  It does not appear to be raised.  She has a history of sun exposure but tries to be really good about minimizing her exposure.   Review of Systems  Constitutional: Negative.   HENT: Negative.   Eyes: Negative.   Respiratory: Negative.   Cardiovascular: Negative.   Gastrointestinal: Negative.   Genitourinary: Negative.   Musculoskeletal: Negative.   Skin: Positive for color change.  Hematological: Negative.   Psychiatric/Behavioral: Negative.     Past Medical History:  Diagnosis Date  . Anxiety   . Binge-eating and purging type anorexia nervosa   . Bipolar disorder ([place])   . Cystitis, interstitial   . Depression   . Esophageal polyp    about 20 years ago  . GERD (gastroesophageal reflux disease)   . OCD (obsessive compulsive disorder)     Past Surgical History:  Procedure Laterality Date  . BLADDER SURGERY    . CHOLECYSTECTOMY    . ESOPHAGOGASTRODUODENOSCOPY  09/28/2016   [facility]; Dr. Therisa Doyne; erosions in the esophagus, 5 cm hiatal hernia, nonbleeding erosive gastropathy s/p biopsied, normal duodenum.  Path with chronic inactive gastritis, no H. pylori or intestinal metaplasia.   [facility] ESOPHAGOGASTRODUODENOSCOPY (EGD) WITH PROPOFOL N/A 10/17/2018   Dr. Gala Romney: mild reflux esophagitis, small hiatal hernia  . VOCAL CORD LATERALIZATION, ENDOSCOPIC APPROACH W/ MLB        Current  Outpatient Medications:  .  acetaminophen (TYLENOL) 500 MG tablet, Take 1,000 mg by mouth every 6 (six) hours as needed for moderate pain or headache., Disp: , Rfl:  .  Brexpiprazole ([name]) 3 MG TABS, Take 1 tablet (3 mg total) by mouth in the morning., Disp: 30 tablet, Rfl: 1 .  clonazePAM (KLONOPIN) 0.5 MG tablet, Take 1 tablet (0.5 mg total) by mouth 2 (two) times daily as needed for anxiety., Disp: 20 tablet, Rfl: 5 .  cycloSPORINE (RESTASIS) 0.05 % ophthalmic emulsion, Place 1 drop into both eyes 2 (two) times daily. , Disp: , Rfl:  .  diclofenac Sodium (VOLTAREN) 1 % GEL, Apply 1 application topically as needed. , Disp: , Rfl:  .  dicyclomine (BENTYL) 20 MG tablet, Take 1 tablet (20 mg total) by mouth 3 (three) times daily as needed for spasms (abdominal cramping)., Disp: 20 tablet, Rfl: 0 .  Erenumab-aooe 140 MG/ML SOAJ, Inject 140 mg into the skin every 30 (thirty) days., Disp: 3 pen, Rfl: 3 .  famotidine (PEPCID) 40 MG tablet, Take 1 tablet (40 mg total) by mouth 2 (two) times daily. For 12 weeks, then go back to 20mg  twice daily., Disp: 60 tablet, Rfl: 3 .  fluticasone (FLONASE) 50 MCG/ACT nasal spray, Place 1 spray into both nostrils daily as needed for allergies. , Disp: , Rfl:  .  fluvoxaMINE (LUVOX) 100 MG tablet, Take 3 tablets (300 mg total) by mouth at bedtime., Disp: 270 tablet, Rfl: 1 .  gabapentin (NEURONTIN) 600 MG tablet, Take 2 tablets (1,200 mg total) by mouth 2 (two) times daily., Disp: 120 tablet, Rfl: 5 .  hydrOXYzine (ATARAX/VISTARIL) 25 MG tablet, Take 1 tablet (25 mg total) by mouth 3 (three) times daily as needed., Disp: 60 tablet, Rfl: 1 .  hyoscyamine (LEVSIN) 0.125 MG tablet, Take 1 tablet (0.125 mg total) by mouth every 4 (four) hours as needed (diarrhea)., Disp: 120 tablet, Rfl: 1 .  lidocaine-prilocaine (EMLA) cream, , Disp: , Rfl:  .  loratadine (CLARITIN) 10 MG tablet, Take 10 mg by mouth daily., Disp: , Rfl:  .  lurasidone (LATUDA) 80 MG TABS tablet, Take 1  tablet (80 mg total) by mouth daily with breakfast., Disp: 30 tablet, Rfl: 5 .  metroNIDAZOLE (METROGEL) 1 % gel, Apply topically daily., Disp: 45 g, Rfl: 0 .  olopatadine (PATANOL) 0.1 % ophthalmic solution, Place 1 drop into both eyes 2 (two) times daily. , Disp: , Rfl:  .  ondansetron (ZOFRAN ODT) 4 MG disintegrating tablet, Take 1 tablet (4 mg total) by mouth every 8 (eight) hours as needed for nausea or vomiting., Disp: 30 tablet, Rfl: 1 .  polyethylene glycol-electrolytes (TRILYTE) 420 g solution, Take 4,000 mLs by mouth as directed., Disp: 4000 mL, Rfl: 0 .  Rimegepant Sulfate (NURTEC) 75 MG TBDP, Take 75 mg by mouth daily as needed (take for abortive therapy of migraine, no more than 1 tablet in 24 hours or 10 per month)., Disp: 10 tablet, Rfl: 11 .  traZODone (DESYREL) 100 MG tablet, Take 1-2 tablets (100-200 mg total) by mouth at bedtime as needed for sleep., Disp: 60 tablet, Rfl: 5 .  valACYclovir (VALTREX) 500 MG tablet, Take 500 mg by mouth daily as needed (outbreak). , Disp: , Rfl:  .  albuterol (VENTOLIN HFA) 108 (90 Base) MCG/ACT inhaler, Inhale 1-2 puffs into the lungs every 6 (six) hours as needed for wheezing or shortness of breath. (Patient not taking: Reported on 06/27/2019), Disp: 18 g, Rfl: 0   Objective:   Vitals:   06/27/19 1113  BP: 110/72  Pulse: 70  Temp: 97.7 F (36.5 C)  SpO2: 98%    Physical Exam Vitals and nursing note reviewed.  Constitutional:      Appearance: Normal appearance.  HENT:     Head: Normocephalic and atraumatic.  Cardiovascular:     Rate and Rhythm: Normal rate.     Pulses: Normal pulses.  Pulmonary:     Effort: Pulmonary effort is normal.  Neurological:     General: No focal deficit present.     Mental Status: She is alert and oriented to person, place, and time.  Psychiatric:        Mood and Affect: Mood normal.        Behavior: Behavior normal.        Thought Content: Thought content normal.     Assessment & Plan:  Changing  pigmented skin lesion  Biopsy of changing skin lesion nose.  If path is negative then we can laser it.  Pictures were obtained of the patient and placed in the chart with the patient's or guardian's permission.   Ferguson, DO

## 2019-06-28 ENCOUNTER — Telehealth: Payer: Self-pay | Admitting: Psychiatry

## 2019-06-28 ENCOUNTER — Other Ambulatory Visit: Payer: Medicare PPO

## 2019-06-28 MED ORDER — LIDOCAINE-PRILOCAINE 2.5-2.5 % EX CREA
1.0000 | TOPICAL_CREAM | CUTANEOUS | 0 refills | Status: DC | PRN
Start: 2019-06-28 — End: 2019-12-07

## 2019-06-28 NOTE — Telephone Encounter (Signed)
Noted  

## 2019-06-28 NOTE — Telephone Encounter (Signed)
She should be off the Latuda by now.  Have her go ahead and stop it.  Continue the [name] for now.  She's only been on it a week and the increased hunger usually doesn't continue.  She needs to give it more time.

## 2019-06-28 NOTE — Telephone Encounter (Signed)
Patient called and returned traci call however she said she is not off the latuda because she just picked up the rexulti because it took a week to get the PA approved. She said she wants to eat constantly and she told teresa that she didn't want to eat constantly. Please give her a call after lunch tomorrow. [phone]

## 2019-06-28 NOTE — Telephone Encounter (Signed)
Left detailed message with information and to call back with an update.

## 2019-06-28 NOTE — Telephone Encounter (Signed)
-----   Message from [place] sent at 06/27/2019 12:17 PM EDT ----- Hello! Please send EMLA cream to patient's pharmacy: [facility] - , [place]  Thank you!

## 2019-06-28 NOTE — Telephone Encounter (Signed)
Jennifer [name] called to report that the [name] is causing increase in her appetite. She is eating everything in sight.  She is still on the [name] as well which is also causing stomach issues.  This combination just isn't working.  Please call to discuss

## 2019-06-29 ENCOUNTER — Encounter: Payer: Self-pay | Admitting: Family Medicine

## 2019-06-29 ENCOUNTER — Ambulatory Visit: Payer: Self-pay | Admitting: Family Medicine

## 2019-06-29 NOTE — Telephone Encounter (Signed)
See other phone message  

## 2019-06-29 NOTE — Telephone Encounter (Signed)
Rtc to Jennifer [name] and discussed her taper, she was unable to pick up [place] till Monday, started on Tuesday. She's been tapering off of Latuda and has been taking 60 mg for 5 days then, advised her to decrease to 30 mg for 2 nights then 20 mg for 2 nights then stop. Patient very anxious on phone. She's anxious about her bingeing and purging during the change of medication. Instructed her to take Zofran tonight and the next few nights before bedtime so she doesn't wake up vomiting. She agreed. Patient was reassured after phone call.

## 2019-07-04 ENCOUNTER — Encounter: Payer: Self-pay | Admitting: Plastic Surgery

## 2019-07-04 ENCOUNTER — Other Ambulatory Visit (HOSPITAL_COMMUNITY)
Admission: RE | Admit: 2019-07-04 | Discharge: 2019-07-04 | Disposition: A | Discharge status: Home / Self Care | Payer: Medicare PPO | Source: Ambulatory Visit | Attending: Plastic Surgery | Admitting: Plastic Surgery

## 2019-07-04 ENCOUNTER — Other Ambulatory Visit: Payer: Self-pay

## 2019-07-04 ENCOUNTER — Ambulatory Visit (INDEPENDENT_AMBULATORY_CARE_PROVIDER_SITE_OTHER): Payer: Medicare PPO | Admitting: Plastic Surgery

## 2019-07-04 VITALS — BP 119/75 | HR 72 | Temp 96.9°F

## 2019-07-04 DIAGNOSIS — L821 Other seborrheic keratosis: Secondary | ICD-10-CM | POA: Diagnosis not present

## 2019-07-04 DIAGNOSIS — F3132 Bipolar disorder, current episode depressed, moderate: Secondary | ICD-10-CM | POA: Diagnosis not present

## 2019-07-04 DIAGNOSIS — L989 Disorder of the skin and subcutaneous tissue, unspecified: Secondary | ICD-10-CM | POA: Insufficient documentation

## 2019-07-04 NOTE — Progress Notes (Signed)
Procedure Note  Preoperative Dx: Changing skin lesion of nose  Postoperative Dx: Same  Procedure: biopsy of nose changing skin lesion 3 mm  Anesthesia: Lidocaine 1% with 1:100,000 epinepherine  Description of Procedure: Risks and complications were explained to the patient.  Consent was confirmed and the patient understands the risks and benefits.  The potential complications and alternatives were explained and the patient consents.  The patient expressed understanding the option of not having the procedure and the risks of a scar.  Time out was called and all information was confirmed to be correct.    The area was prepped and drapped.  Lidocaine 1% with epinepherine was injected in the subcutaneous area.  After waiting several minutes for the local to take affect a 3 mm punch biopsy was used to obtain a tissue sample of the changing skin lesion of the nose.   A 5-0 Monocryl was used to close the skin edges.  A dressing was applied.  The patient was given instructions on how to care for the area and a follow up appointment.  Jennifer [name] tolerated the procedure well and there were no complications. The specimen was sent to pathology.

## 2019-07-05 LAB — SURGICAL PATHOLOGY

## 2019-07-06 NOTE — Progress Notes (Signed)
Forms from [facility] was received for PA for Nurtec 75mg . Forms were signed by Dr. Westley Hummer and faxed back. Confirmation received.   Status: Pending

## 2019-07-06 NOTE — Progress Notes (Signed)
PA submitted for Nurtec 75mg  on Cover my Meds. There is a 24/72hr turn around. Ref #: [id number]  Key: [id number] Status:PENDING

## 2019-07-10 ENCOUNTER — Encounter: Payer: Self-pay | Admitting: Family Medicine

## 2019-07-10 DIAGNOSIS — M654 Radial styloid tenosynovitis [de Quervain]: Secondary | ICD-10-CM | POA: Diagnosis not present

## 2019-07-10 DIAGNOSIS — M24132 Other articular cartilage disorders, left wrist: Secondary | ICD-10-CM | POA: Diagnosis not present

## 2019-07-10 DIAGNOSIS — M24232 Disorder of ligament, left wrist: Secondary | ICD-10-CM | POA: Diagnosis not present

## 2019-07-10 DIAGNOSIS — M25712 Osteophyte, left shoulder: Secondary | ICD-10-CM | POA: Diagnosis not present

## 2019-07-10 DIAGNOSIS — M1812 Unilateral primary osteoarthritis of first carpometacarpal joint, left hand: Secondary | ICD-10-CM | POA: Diagnosis not present

## 2019-07-10 DIAGNOSIS — M19012 Primary osteoarthritis, left shoulder: Secondary | ICD-10-CM | POA: Diagnosis not present

## 2019-07-10 NOTE — Progress Notes (Signed)
PA determination: Approved until 01/19/2020. Approval sent for scanning

## 2019-07-11 ENCOUNTER — Telehealth: Payer: Self-pay

## 2019-07-11 ENCOUNTER — Telehealth: Payer: Self-pay | Admitting: Internal Medicine

## 2019-07-11 NOTE — Telephone Encounter (Signed)
Call to pt per Dr. Marla Roe- gave pt her path result & she understands the results & she is coming this Friday 07/14/19 for f/u & suture removal She has no c/o of any complications  She does want to have the area lasered (per Dr. Eusebio Friendly advice)- she will make that appointment for Oct. @  her f/u

## 2019-07-11 NOTE — Telephone Encounter (Signed)
If she chooses to do a Cologuard, this will screen her for colon cancer only. It will not help [place] evaluate her diarrhea.

## 2019-07-11 NOTE — Telephone Encounter (Signed)
Pt called to cancel her procedure with RMR on 7/13 and wants to schedule a cologuard. [phone]

## 2019-07-11 NOTE — Telephone Encounter (Signed)
Called and informed pt of LSL's advice. She is having some problems with her hiatal hernia. Taking her meds as prescribed. PCP changed depression/anxiety meds and she's doing some better. Will be seeing therapist in August. She has severe abdominal pain if she doesn't eat and she's afraid she will not be able to do prep and not be able to eat solid food for 1 1/2 days. Depression/anxiety meds make her hungry. She doesn't want to take chance of having to cancel day before procedure d/t abdominal pain from not being able to eat. She wants to know if she does cologuard now and she gets straightened out with her depression/anxiety meds could she do TCS in few months to evaluate diarrhea?

## 2019-07-11 NOTE — Telephone Encounter (Signed)
Routing to LSL to see if pt can do Cologuard prior to cancelling procedure.

## 2019-07-12 NOTE — Telephone Encounter (Signed)
She has cancelled tcs before for same reasons as mentioned. If she wants to pursue cologuard now and hold off on tcs she can. tcs can be done later for diarrhea if needed.

## 2019-07-13 NOTE — Telephone Encounter (Signed)
Spoke with pt. Cologuard order form from [employer] is ready to be signed by pt & LSL. Pt will be stopping by office today to sign form. Form will be faxed to [employer] when completed.

## 2019-07-13 NOTE — Progress Notes (Signed)
Patient is a 54 yr-old female here for follow-up after undergoing excision of a changing skin lesion on her nose (3 mm) on 07/04/2019 with Dr. Marla Roe.  Surgical pathology discussed with patient: pigmented seborrheic keratosis  ~ 10 days PO Patient reports she is doing very well.  Incision is healing nicely, C/D/I.  No signs of redness, drainage, infection.  Patient denies fever, nausea/vomiting.  Suture removed today.  May apply Vaseline or scar cream to incision line.  Discussed importance of using sunscreen and and other forms of sun protection for new scar.  Patient would like to have the entire lesion removed at some point, considering October timeframe.  Follow-up as needed.  Call office with any questions/concerns.  The [place] was signed into law in 2016 which includes the topic of electronic health records.  This provides immediate access to information in MyChart.  This includes consultation notes, operative notes, office notes, lab results and pathology reports.  If you have any questions about what you read please let us know at your next visit or call us at the office.  We are right here with you.

## 2019-07-13 NOTE — Telephone Encounter (Signed)
noted 

## 2019-07-13 NOTE — Telephone Encounter (Signed)
Called and informed pt of LSL's recommendation. She would like to do cologuard for now instead of TCS.  Informed endo scheduler to cancel TCS.

## 2019-07-14 ENCOUNTER — Other Ambulatory Visit: Payer: Self-pay

## 2019-07-14 ENCOUNTER — Telehealth: Payer: Self-pay | Admitting: Internal Medicine

## 2019-07-14 ENCOUNTER — Ambulatory Visit (INDEPENDENT_AMBULATORY_CARE_PROVIDER_SITE_OTHER): Payer: Medicare PPO | Admitting: Plastic Surgery

## 2019-07-14 ENCOUNTER — Encounter: Payer: Self-pay | Admitting: Plastic Surgery

## 2019-07-14 VITALS — BP 124/80 | HR 78 | Temp 97.3°F

## 2019-07-14 DIAGNOSIS — M654 Radial styloid tenosynovitis [de Quervain]: Secondary | ICD-10-CM | POA: Diagnosis not present

## 2019-07-14 DIAGNOSIS — L989 Disorder of the skin and subcutaneous tissue, unspecified: Secondary | ICD-10-CM

## 2019-07-14 NOTE — Telephone Encounter (Signed)
Patient l/m that she can not come to sign the cologuard forms due to work and asked if we could mail them to her..I put them in the mail Friday

## 2019-07-17 DIAGNOSIS — M25832 Other specified joint disorders, left wrist: Secondary | ICD-10-CM | POA: Diagnosis not present

## 2019-07-17 NOTE — Progress Notes (Signed)
Submitted PA for Nurtec 75mg  on Cover my Meds. Key: [id number]   Status: Approved  Prior Auth: Coverage Start Date:01/20/2019; Coverage End Date:01/19/2020

## 2019-07-18 ENCOUNTER — Other Ambulatory Visit: Payer: Self-pay | Admitting: Orthopedic Surgery

## 2019-07-21 ENCOUNTER — Telehealth (INDEPENDENT_AMBULATORY_CARE_PROVIDER_SITE_OTHER): Payer: Medicare PPO | Admitting: Physician Assistant

## 2019-07-21 ENCOUNTER — Encounter: Payer: Self-pay | Admitting: Physician Assistant

## 2019-07-21 DIAGNOSIS — F411 Generalized anxiety disorder: Secondary | ICD-10-CM | POA: Diagnosis not present

## 2019-07-21 DIAGNOSIS — F509 Eating disorder, unspecified: Secondary | ICD-10-CM

## 2019-07-21 DIAGNOSIS — F429 Obsessive-compulsive disorder, unspecified: Secondary | ICD-10-CM | POA: Diagnosis not present

## 2019-07-21 DIAGNOSIS — F319 Bipolar disorder, unspecified: Secondary | ICD-10-CM | POA: Diagnosis not present

## 2019-07-21 NOTE — Progress Notes (Signed)
[facility] Med Check  Patient ID: Jennifer [name],  MRN: [id number]  PCP: Perlie Mayo, NP  Date of Evaluation: 07/21/2019 Time spent:20 minutes  Chief Complaint:  Chief Complaint    Depression; Anxiety; Insomnia     Virtual Visit via Telephone or Video Note  I connected with patient by a video enabled telemedicine application or telephone, with their informed consent, and verified patient privacy and that I am speaking with the correct person using two identifiers.  I am private, in my office and the patient is home.  I discussed the limitations, risks, security and privacy concerns of performing an evaluation and management service by video and the availability of in person appointments. I also discussed with the patient that there may be a patient responsible charge related to this service. The patient expressed understanding and agreed to proceed.   I discussed the assessment and treatment plan with the patient. The patient was provided an opportunity to ask questions and all were answered. The patient agreed with the plan and demonstrated an understanding of the instructions.   The patient was advised to call back or seek an in-person evaluation if the symptoms worsen or if the condition fails to improve as anticipated.  I provided 20 minutes of non-face-to-face time during this encounter.  HISTORY/CURRENT STATUS: HPI For routine med check.  We started her on [name] a few weeks ago.  States she is feeling good and her mood is pretty stable.  She is enjoying things more, energy and motivation are a little better.  Not crying easily.  She is sleeping good and sometimes does not need the trazodone.  Not getting as anxious as she has been.  She does not take the Klonopin frequently.  She uses the hydroxyzine as needed certainly more often than she takes the Klonopin.  She has started purging again.  She is lost around 40 pounds, purposely in the past 6 months and she is now  letting herself eat a little more than she had been.  That is causing her to feel that she has to purge though.  She is trying to remember her coping skills so that when she has the desire to purge she can distract easier.  Patient denies increased energy with decreased need for sleep, no increased talkativeness, no racing thoughts, no impulsivity or risky behaviors, no increased spending, no increased libido, no grandiosity, she does get really irritable at times but never knows when it is going to happen.  No hallucinations.  Denies dizziness, syncope, seizures, numbness, tingling, tremor, tics, unsteady gait, slurred speech, confusion. Denies muscle or joint pain, stiffness, or dystonia.Denies unexplained weight loss, frequent infections, or sores that heal slowly.  No polyphagia, polydipsia, or polyuria. Denies visual changes or paresthesias.   Individual Medical History/ Review of Systems: Changes? :Yes She will be having hand surgery at the end of August.  Past medications for mental health diagnoses include: Risperdal, Seroquel, Prozac, Zoloft, Wellbutrin, Lamictal, Depakote caused hair loss,Xanax, Ambien, trazodone, Trileptal, Luvox, Topamax, Elavil, Pamelor, BuSpar, doxazosin, prazosin, lithium, Vraylar, Abilify, [name], Latuda >60 mg caused abd pain and nausea  Allergies: Codeine, Meloxicam, Sonata [zaleplon], Topamax [topiramate], Aimovig [erenumab-aooe], Emgality [galcanezumab-gnlm], and Penicillins  Current Medications:  Current Outpatient Medications:  .  acetaminophen (TYLENOL) 500 MG tablet, Take 1,000 mg by mouth every 6 (six) hours as needed for moderate pain or headache., Disp: , Rfl:  .  albuterol (VENTOLIN HFA) 108 (90 Base) MCG/ACT inhaler, Inhale 1-2 puffs into the lungs every  6 (six) hours as needed for wheezing or shortness of breath., Disp: 18 g, Rfl: 0 .  Brexpiprazole ([name]) 3 MG TABS, Take 1 tablet (3 mg total) by mouth in the morning., Disp: 30 tablet, Rfl: 1 .   clonazePAM (KLONOPIN) 0.5 MG tablet, Take 1 tablet (0.5 mg total) by mouth 2 (two) times daily as needed for anxiety., Disp: 20 tablet, Rfl: 5 .  cycloSPORINE (RESTASIS) 0.05 % ophthalmic emulsion, Place 1 drop into both eyes 2 (two) times daily. , Disp: , Rfl:  .  diclofenac Sodium (VOLTAREN) 1 % GEL, Apply 1 application topically as needed. , Disp: , Rfl:  .  dicyclomine (BENTYL) 20 MG tablet, Take 1 tablet (20 mg total) by mouth 3 (three) times daily as needed for spasms (abdominal cramping)., Disp: 20 tablet, Rfl: 0 .  famotidine (PEPCID) 40 MG tablet, Take 1 tablet (40 mg total) by mouth 2 (two) times daily. For 12 weeks, then go back to 20mg  twice daily., Disp: 60 tablet, Rfl: 3 .  fluticasone (FLONASE) 50 MCG/ACT nasal spray, Place 1 spray into both nostrils daily as needed for allergies. , Disp: , Rfl:  .  fluvoxaMINE (LUVOX) 100 MG tablet, Take 3 tablets (300 mg total) by mouth at bedtime., Disp: 270 tablet, Rfl: 1 .  gabapentin (NEURONTIN) 600 MG tablet, Take 2 tablets (1,200 mg total) by mouth 2 (two) times daily., Disp: 120 tablet, Rfl: 5 .  hydrOXYzine (ATARAX/VISTARIL) 25 MG tablet, Take 1 tablet (25 mg total) by mouth 3 (three) times daily as needed., Disp: 60 tablet, Rfl: 1 .  hyoscyamine (LEVSIN) 0.125 MG tablet, Take 1 tablet (0.125 mg total) by mouth every 4 (four) hours as needed (diarrhea)., Disp: 120 tablet, Rfl: 1 .  lidocaine-prilocaine (EMLA) cream, , Disp: , Rfl:  .  lidocaine-prilocaine (EMLA) cream, Apply 1 application topically as needed. Apply to area 45 min prior to procedure, Disp: 5 g, Rfl: 0 .  loratadine (CLARITIN) 10 MG tablet, Take 10 mg by mouth daily., Disp: , Rfl:  .  metroNIDAZOLE (METROGEL) 1 % gel, Apply topically daily., Disp: 45 g, Rfl: 0 .  olopatadine (PATANOL) 0.1 % ophthalmic solution, Place 1 drop into both eyes 2 (two) times daily. , Disp: , Rfl:  .  ondansetron (ZOFRAN ODT) 4 MG disintegrating tablet, Take 1 tablet (4 mg total) by mouth every 8  (eight) hours as needed for nausea or vomiting., Disp: 30 tablet, Rfl: 1 .  polyethylene glycol-electrolytes (TRILYTE) 420 g solution, Take 4,000 mLs by mouth as directed., Disp: 4000 mL, Rfl: 0 .  Rimegepant Sulfate (NURTEC) 75 MG TBDP, Take 75 mg by mouth daily as needed (take for abortive therapy of migraine, no more than 1 tablet in 24 hours or 10 per month)., Disp: 10 tablet, Rfl: 11 .  traZODone (DESYREL) 100 MG tablet, Take 1-2 tablets (100-200 mg total) by mouth at bedtime as needed for sleep., Disp: 60 tablet, Rfl: 5 .  valACYclovir (VALTREX) 500 MG tablet, Take 500 mg by mouth daily as needed (outbreak). , Disp: , Rfl:  Medication Side Effects: none  Family Medical/ Social History: Changes? No  MENTAL HEALTH EXAM:  Last menstrual period 10/07/2011.There is no height or weight on file to calculate BMI.  General Appearance: Casual, Neat and Well Groomed  Eye Contact:  Good  Speech:  Clear and Coherent and Normal Rate  Volume:  Normal  Mood:  Euthymic  Affect:  Appropriate  Thought Process:  Goal Directed and Descriptions of Associations: Intact  Orientation:  Full (Time, Place, and Person)  Thought Content: Logical   Suicidal Thoughts:  No  Homicidal Thoughts:  No  Memory:  WNL  Judgement:  Good  Insight:  Good  Psychomotor Activity:  Normal from what I can see on video  Concentration:  Concentration: Good  Recall:  Good  Fund of Knowledge: Good  Language: Good  Assets:  Desire for Improvement  ADL's:  Intact  Cognition: WNL  Prognosis:  Good    DIAGNOSES:    ICD-10-CM   1. Bipolar I disorder ([place])  F31.9   2. Eating disorder, unspecified type  F50.9   3. Generalized anxiety disorder  F41.1   4. Obsessive-compulsive disorder, unspecified type  F42.9     Receiving Psychotherapy: Yes will start with Trey Paula in August. Saw Dede Query once and stated it was a bad experience.  She felt worse after she left and she did when she went and.  She saw her twice.                                      RECOMMENDATIONS:  PDMP reviewed.  I provided 20 minutes of nonface-to-face care during this encounter.  Continue [name] 3 mg, 1 p.o. daily. Continue Klonopin 0.5 mg, 1 p.o. twice daily as needed.  She takes pretty rarely. Continue Luvox 100 mg, 3 p.o. nightly. Continue gabapentin 600 mg, 2 p.o. twice daily. Decrease hydroxyzine to 25 mg, 1/2-1 every 8 hours as needed anxiety. Continue trazodone 100 mg, 1.5 pills nightly.  Continue therapy. Return in 6 weeks.  [name], PA-C

## 2019-07-28 ENCOUNTER — Encounter (HOSPITAL_COMMUNITY): Payer: Self-pay

## 2019-07-28 ENCOUNTER — Other Ambulatory Visit (HOSPITAL_COMMUNITY): Payer: Medicare PPO

## 2019-08-01 ENCOUNTER — Ambulatory Visit (HOSPITAL_COMMUNITY): Admit: 2019-08-01 | Payer: Medicare PPO | Admitting: Internal Medicine

## 2019-08-01 ENCOUNTER — Encounter (HOSPITAL_COMMUNITY): Payer: Self-pay

## 2019-08-01 SURGERY — COLONOSCOPY WITH PROPOFOL
Anesthesia: Monitor Anesthesia Care

## 2019-08-01 NOTE — Telephone Encounter (Signed)
Form was faxed to [employer] [phone].

## 2019-08-07 DIAGNOSIS — M9901 Segmental and somatic dysfunction of cervical region: Secondary | ICD-10-CM | POA: Diagnosis not present

## 2019-08-07 DIAGNOSIS — M5033 Other cervical disc degeneration, cervicothoracic region: Secondary | ICD-10-CM | POA: Diagnosis not present

## 2019-08-09 DIAGNOSIS — M9901 Segmental and somatic dysfunction of cervical region: Secondary | ICD-10-CM | POA: Diagnosis not present

## 2019-08-09 DIAGNOSIS — M5033 Other cervical disc degeneration, cervicothoracic region: Secondary | ICD-10-CM | POA: Diagnosis not present

## 2019-08-10 DIAGNOSIS — M5033 Other cervical disc degeneration, cervicothoracic region: Secondary | ICD-10-CM | POA: Diagnosis not present

## 2019-08-10 DIAGNOSIS — M9901 Segmental and somatic dysfunction of cervical region: Secondary | ICD-10-CM | POA: Diagnosis not present

## 2019-08-14 DIAGNOSIS — M9901 Segmental and somatic dysfunction of cervical region: Secondary | ICD-10-CM | POA: Diagnosis not present

## 2019-08-14 DIAGNOSIS — M5033 Other cervical disc degeneration, cervicothoracic region: Secondary | ICD-10-CM | POA: Diagnosis not present

## 2019-08-16 DIAGNOSIS — M9901 Segmental and somatic dysfunction of cervical region: Secondary | ICD-10-CM | POA: Diagnosis not present

## 2019-08-16 DIAGNOSIS — M5033 Other cervical disc degeneration, cervicothoracic region: Secondary | ICD-10-CM | POA: Diagnosis not present

## 2019-08-17 DIAGNOSIS — M9901 Segmental and somatic dysfunction of cervical region: Secondary | ICD-10-CM | POA: Diagnosis not present

## 2019-08-17 DIAGNOSIS — M5033 Other cervical disc degeneration, cervicothoracic region: Secondary | ICD-10-CM | POA: Diagnosis not present

## 2019-08-21 DIAGNOSIS — M9901 Segmental and somatic dysfunction of cervical region: Secondary | ICD-10-CM | POA: Diagnosis not present

## 2019-08-21 DIAGNOSIS — M5033 Other cervical disc degeneration, cervicothoracic region: Secondary | ICD-10-CM | POA: Diagnosis not present

## 2019-08-22 ENCOUNTER — Encounter: Payer: Self-pay | Admitting: Family Medicine

## 2019-08-22 ENCOUNTER — Ambulatory Visit: Payer: Medicare PPO | Admitting: Psychology

## 2019-08-22 DIAGNOSIS — Z1212 Encounter for screening for malignant neoplasm of rectum: Secondary | ICD-10-CM | POA: Diagnosis not present

## 2019-08-22 DIAGNOSIS — Z1211 Encounter for screening for malignant neoplasm of colon: Secondary | ICD-10-CM | POA: Diagnosis not present

## 2019-08-24 DIAGNOSIS — M5033 Other cervical disc degeneration, cervicothoracic region: Secondary | ICD-10-CM | POA: Diagnosis not present

## 2019-08-24 DIAGNOSIS — M9901 Segmental and somatic dysfunction of cervical region: Secondary | ICD-10-CM | POA: Diagnosis not present

## 2019-08-28 DIAGNOSIS — M9901 Segmental and somatic dysfunction of cervical region: Secondary | ICD-10-CM | POA: Diagnosis not present

## 2019-08-28 DIAGNOSIS — M5033 Other cervical disc degeneration, cervicothoracic region: Secondary | ICD-10-CM | POA: Diagnosis not present

## 2019-08-29 ENCOUNTER — Ambulatory Visit
Admission: EM | Admit: 2019-08-29 | Discharge: 2019-08-29 | Disposition: A | Discharge status: Home / Self Care | Payer: Medicare PPO | Attending: Emergency Medicine | Admitting: Emergency Medicine

## 2019-08-29 ENCOUNTER — Encounter: Payer: Self-pay | Admitting: Family Medicine

## 2019-08-29 ENCOUNTER — Encounter: Payer: Self-pay | Admitting: Emergency Medicine

## 2019-08-29 DIAGNOSIS — G43109 Migraine with aura, not intractable, without status migrainosus: Secondary | ICD-10-CM | POA: Diagnosis not present

## 2019-08-29 MED ORDER — KETOROLAC TROMETHAMINE 60 MG/2ML IM SOLN
60.0000 mg | Freq: Once | INTRAMUSCULAR | Status: AC
  Administered 2019-08-29: 60 mg via INTRAMUSCULAR

## 2019-08-29 MED ORDER — ONDANSETRON 4 MG PO TBDP
4.0000 mg | ORAL_TABLET | Freq: Once | ORAL | Status: AC
  Administered 2019-08-29: 4 mg via ORAL

## 2019-08-29 NOTE — ED Provider Notes (Signed)
[place]   [id number] 08/29/19 Arrival Time: 0811  Chief Complaint  Patient presents with   Migraine     SUBJECTIVE:  Jennifer [name] is a 54 y.o. female who presented to the urgent care for complaint of migraine headache started last night.  Denies a precipitating event, or recent head trauma.  Patient localizes her pain to the frontal head.  Describes the pain as constant and throbbing in character.  Patient has tried Nurtec without relief. Symptoms are made worse with light.  Reports similar symptoms in the past that improved with Toradol and Zofran IM.  This is not the worst headache of their life.  Patient denies fever, chills, nausea, vomiting,  rhinorrhea, watery eyes, chest pain, SOB, abdominal pain, weakness, numbness or tingling, slurred speech.     ROS: As per HPI.  All other pertinent ROS negative.      Past Medical History:  Diagnosis Date   Anxiety    Binge-eating and purging type anorexia nervosa    Bipolar disorder (HCC)    Cystitis, interstitial    Depression    Esophageal polyp    about 20 years ago   GERD (gastroesophageal reflux disease)    OCD (obsessive compulsive disorder)    Past Surgical History:  Procedure Laterality Date   BLADDER SURGERY     CHOLECYSTECTOMY     ESOPHAGOGASTRODUODENOSCOPY  09/28/2016   [facility]; Dr. Therisa Doyne; erosions in the esophagus, 5 cm hiatal hernia, nonbleeding erosive gastropathy s/p biopsied, normal duodenum.  Path with chronic inactive gastritis, no H. pylori or intestinal metaplasia.    ESOPHAGOGASTRODUODENOSCOPY (EGD) WITH PROPOFOL N/A 10/17/2018   Dr. Gala Romney: mild reflux esophagitis, small hiatal hernia   VOCAL CORD LATERALIZATION, ENDOSCOPIC APPROACH W/ MLB     Allergies  Allergen Reactions   Codeine Shortness Of Breath, Nausea Only and Rash   Meloxicam Other (See Comments)    Possible chest tightness - instructed by MD not to take   Sonata [Zaleplon] Other (See Comments)    Hallucinations    Topamax [Topiramate] Other (See Comments)    Low BP and dizziness   Aimovig [Erenumab-Aooe] Itching   Emgality [Galcanezumab-Gnlm] Rash   Penicillins Hives, Swelling and Rash    Has patient had a PCN reaction causing immediate rash, facial/tongue/throat swelling, SOB or lightheadedness with hypotension: yes Has patient had a PCN reaction causing severe rash involving mucus membranes or skin necrosis: no Has patient had a PCN reaction that required hospitalization: no Has patient had a PCN reaction occurring within the last 10 years: no If all of the above answers are "NO", then may proceed with Cephalosporin use.;    No current facility-administered medications on file prior to encounter.   Current Outpatient Medications on File Prior to Encounter  Medication Sig Dispense Refill   acetaminophen (TYLENOL) 500 MG tablet Take 1,000 mg by mouth every 6 (six) hours as needed for moderate pain or headache.     albuterol (VENTOLIN HFA) 108 (90 Base) MCG/ACT inhaler Inhale 1-2 puffs into the lungs every 6 (six) hours as needed for wheezing or shortness of breath. 18 g 0   Brexpiprazole ([name]) 3 MG TABS Take 1 tablet (3 mg total) by mouth in the morning. 30 tablet 1   clonazePAM (KLONOPIN) 0.5 MG tablet Take 1 tablet (0.5 mg total) by mouth 2 (two) times daily as needed for anxiety. 20 tablet 5   cycloSPORINE (RESTASIS) 0.05 % ophthalmic emulsion Place 1 drop into both eyes 2 (two) times daily.  diclofenac Sodium (VOLTAREN) 1 % GEL Apply 1 application topically as needed.      dicyclomine (BENTYL) 20 MG tablet Take 1 tablet (20 mg total) by mouth 3 (three) times daily as needed for spasms (abdominal cramping). 20 tablet 0   famotidine (PEPCID) 40 MG tablet Take 1 tablet (40 mg total) by mouth 2 (two) times daily. For 12 weeks, then go back to 20mg  twice daily. 60 tablet 3   fluticasone (FLONASE) 50 MCG/ACT nasal spray Place 1 spray into both nostrils daily as needed for  allergies.      fluvoxaMINE (LUVOX) 100 MG tablet Take 3 tablets (300 mg total) by mouth at bedtime. 270 tablet 1   gabapentin (NEURONTIN) 600 MG tablet Take 2 tablets (1,200 mg total) by mouth 2 (two) times daily. 120 tablet 5   hydrOXYzine (ATARAX/VISTARIL) 25 MG tablet Take 1 tablet (25 mg total) by mouth 3 (three) times daily as needed. 60 tablet 1   hyoscyamine (LEVSIN) 0.125 MG tablet Take 1 tablet (0.125 mg total) by mouth every 4 (four) hours as needed (diarrhea). 120 tablet 1   lidocaine-prilocaine (EMLA) cream      lidocaine-prilocaine (EMLA) cream Apply 1 application topically as needed. Apply to area 45 min prior to procedure 5 g 0   loratadine (CLARITIN) 10 MG tablet Take 10 mg by mouth daily.     metroNIDAZOLE (METROGEL) 1 % gel Apply topically daily. 45 g 0   olopatadine (PATANOL) 0.1 % ophthalmic solution Place 1 drop into both eyes 2 (two) times daily.      ondansetron (ZOFRAN ODT) 4 MG disintegrating tablet Take 1 tablet (4 mg total) by mouth every 8 (eight) hours as needed for nausea or vomiting. 30 tablet 1   polyethylene glycol-electrolytes (TRILYTE) 420 g solution Take 4,000 mLs by mouth as directed. 4000 mL 0   Rimegepant Sulfate (NURTEC) 75 MG TBDP Take 75 mg by mouth daily as needed (take for abortive therapy of migraine, no more than 1 tablet in 24 hours or 10 per month). 10 tablet 11   traZODone (DESYREL) 100 MG tablet Take 1-2 tablets (100-200 mg total) by mouth at bedtime as needed for sleep. 60 tablet 5   valACYclovir (VALTREX) 500 MG tablet Take 500 mg by mouth daily as needed (outbreak).      Social History   Socioeconomic History   Marital status: Married    Spouse name: Not on file   Number of children: Not on file   Years of education: Not on file   Highest education level: Not on file  Occupational History   Not on file  Tobacco Use   Smoking status: Current Every Day Smoker    Packs/day: 1.00    Years: 20.00    Pack years: 20.00      Types: Cigarettes    Start date: 01/20/1988   Smokeless tobacco: Never Used  Vaping Use   Vaping Use: Never used  Substance and Sexual Activity   Alcohol use: No    Alcohol/week: 0.0 standard drinks   Drug use: No   Sexual activity: Yes    Birth control/protection: None  Other Topics Concern   Not on file  Social History Narrative   Not on file   Social Determinants of Health   Financial Resource Strain:    Difficulty of Paying Living Expenses:   Food Insecurity:    Worried About Estate manager/land agent of Food in the Last Year:    [place] in the  Last Year:   Transportation Needs:    Film/video editor (Medical):    Lack of Transportation (Non-Medical):   Physical Activity:    Days of Exercise per Week:    Minutes of Exercise per Session:   Stress:    Feeling of Stress :   Social Connections:    Frequency of Communication with Friends and Family:    Frequency of Social Gatherings with Friends and Family:    Attends Religious Services:    Active Member of Clubs or Organizations:    Attends Music therapist:    Marital Status:   Intimate Partner Violence:    Fear of Current or Ex-Partner:    Emotionally Abused:    Physically Abused:    Sexually Abused:    Family History  Problem Relation Age of Onset   Healthy Mother    Healthy Father    Colon cancer Neg Hx    Colon polyps Neg Hx     OBJECTIVE:  Vitals:   08/29/19 0823 08/29/19 0834  BP: 124/83   Pulse: 62   Resp: 16   Temp: 98.4 F (36.9 C)   TempSrc: Oral   SpO2: 98%   Weight:  149 lb 14.6 oz (68 kg)  Height:  5\' 4"  (1.626 m)    General appearance: alert; no distress Eyes: PERRLA; EOMI HENT: normocephalic; atraumatic Neck: supple with FROM Lungs: clear to auscultation bilaterally Heart: regular rate and rhythm.  Radial pulses 2+ symmetrical bilaterally Extremities: no edema; symmetrical with no gross deformities Skin: warm and dry Neurologic: CN 1-12  grossly intact; finger to nose without difficulty; normal gait; strength and sensation intact bilaterally about the upper and lower extremities; negative pronator drift Psychological: alert and cooperative; normal mood and affect   ASSESSMENT & PLAN:  1. Migraine with aura and without status migrainosus, not intractable     Meds ordered this encounter  Medications   ketorolac (TORADOL) injection 60 mg   ondansetron (ZOFRAN-ODT) disintegrating tablet 4 mg   Discharge Instructions Migraine cocktail given in office Rest and drink plenty of fluids Use OTC medications as needed for symptomatic relief Follow up with PCP if symptoms persists Return or go to the ER if you have any new or worsening symptoms such as fever, chills, nausea, vomiting, chest pain, shortness of breath, cough, vision changes, worsening headache despite treatment, slurred speech, facial asymmetry, weakness in arms or legs, etc...  Reviewed expectations re: course of current medical issues. Questions answered. Outlined signs and symptoms indicating need for more acute intervention. Patient verbalized understanding. After Visit Summary given.      Note: This document was prepared using Dragon voice recognition software and may include unintentional dictation errors.    Emerson Monte, [place] 08/29/19 [phone]

## 2019-08-29 NOTE — ED Triage Notes (Signed)
Patient states she has had a horrible migraine since last night, sensitive to light and pain along temple.

## 2019-08-29 NOTE — Discharge Instructions (Addendum)
Migraine cocktail given in office Rest and drink plenty of fluids Use OTC medications as needed for symptomatic relief Follow up with PCP if symptoms persists Return or go to the ER if you have any new or worsening symptoms such as fever, chills, nausea, vomiting, chest pain, shortness of breath, cough, vision changes, worsening headache despite treatment, slurred speech, facial asymmetry, weakness in arms or legs, etc..Marland Kitchen

## 2019-08-30 ENCOUNTER — Other Ambulatory Visit: Payer: Self-pay

## 2019-08-30 ENCOUNTER — Ambulatory Visit: Payer: Medicare PPO | Admitting: Family Medicine

## 2019-08-30 ENCOUNTER — Encounter: Payer: Self-pay | Admitting: Family Medicine

## 2019-08-30 VITALS — BP 126/85 | HR 65 | Temp 97.4°F | Resp 18 | Ht 64.0 in | Wt 161.0 lb

## 2019-08-30 DIAGNOSIS — K219 Gastro-esophageal reflux disease without esophagitis: Secondary | ICD-10-CM | POA: Diagnosis not present

## 2019-08-30 DIAGNOSIS — F502 Bulimia nervosa: Secondary | ICD-10-CM

## 2019-08-30 DIAGNOSIS — F3160 Bipolar disorder, current episode mixed, unspecified: Secondary | ICD-10-CM | POA: Diagnosis not present

## 2019-08-30 DIAGNOSIS — G43909 Migraine, unspecified, not intractable, without status migrainosus: Secondary | ICD-10-CM | POA: Diagnosis not present

## 2019-08-30 DIAGNOSIS — F422 Mixed obsessional thoughts and acts: Secondary | ICD-10-CM

## 2019-08-30 DIAGNOSIS — F411 Generalized anxiety disorder: Secondary | ICD-10-CM

## 2019-08-30 LAB — COLOGUARD
COLOGUARD: POSITIVE — AB
Cologuard: POSITIVE — AB

## 2019-08-30 NOTE — Progress Notes (Signed)
Subjective:  Patient ID: Jennifer [name], female    DOB: 03-07-1965  Age: 54 y.o. MRN: [id number]  CC:  Chief Complaint  Patient presents with  . New Patient (Initial Visit)    new pt former dr Holly Bodily pt she has been having migraines sees [facility] for this tomorrow wanted to see about botox also seeing dr Grandville Silos chiopractor in [place] they are also helping   . Foot Pain    right foot pain has a crack on the heel that wont heal and it is killing her       HPI  HPI   Jennifer [name] is a 54 year old female patient who presents today to establish care.  She is seen by several specialists including neurologist, psychiatry, chiropractic, OB/GYN as well.  She is seen at [facility] by psychiatry, has a history of bulimia, binge eating and purging, bipolar disorder, OCD.  Has a new therapist that she will be seeing that is in [place] as well.    More recently she will be seen by [facility] to help see if she can get Botox for migraine relief.  She also reports that she has some right foot pain at a cracked her heel that just feels healed even though she is scrubbed and exfoliated any cream on it.  She reports that she does not sleep well she is up and down most of the night.  She denies having any changes in her teeth sees a dentist regularly.  She denies having any changes in bowel or bladder habits.  Reports that she does use the bathroom quite frequently secondary to history of interstitial cystitis.  She denies having any falls or injuries.  Does report some concentration issues and forgetfulness at times.  Could be related to medications and/or mood disorder.  Denies having any hearing changes unless her ear is blocked.  Denies having any changes in her eyes she is got recent eye exam with new contacts.  She is due for mammogram and Pap and will be going to her OB/GYN next week.  She is seen at [facility].  She reports that she is done Cologuard is seen at  [facility] gastro has not been able to tolerate not going without food for colonoscopy prep.   Today patient denies signs and symptoms of COVID 19 infection including fever, chills, cough, shortness of breath, and headache. Past Medical, Surgical, Social History, Allergies, and Medications have been Reviewed.   Past Medical History:  Diagnosis Date  . Abdominal pain, epigastric 10/07/2018  . Anxiety   . Binge-eating and purging type anorexia nervosa   . Bipolar disorder ([place])   . Cellulitis and abscess of buttock 02/15/2013  . Cystitis, interstitial   . Depression   . Esophageal polyp    about 20 years ago  . GERD (gastroesophageal reflux disease)   . Left wrist pain 03/22/2019  . OCD (obsessive compulsive disorder)   . Palpitations 05/25/2014  . Rib pain on right side 01/31/2019  . Rosacea 03/27/2019  . Sprain of temporomandibular joint or ligament 01/10/2019    Current Meds  Medication Sig  . acetaminophen (TYLENOL) 500 MG tablet Take 1,000 mg by mouth every 6 (six) hours as needed for moderate pain or headache.  . albuterol (VENTOLIN HFA) 108 (90 Base) MCG/ACT inhaler Inhale 1-2 puffs into the lungs every 6 (six) hours as needed for wheezing or shortness of breath.  . Brexpiprazole ([name]) 3 MG TABS Take 1 tablet (3 mg  total) by mouth in the morning.  . clonazePAM (KLONOPIN) 0.5 MG tablet Take 1 tablet (0.5 mg total) by mouth 2 (two) times daily as needed for anxiety.  . cyclobenzaprine (FLEXERIL) 10 MG tablet Take by mouth.  . cycloSPORINE (RESTASIS) 0.05 % ophthalmic emulsion Place 1 drop into both eyes 2 (two) times daily.   . diclofenac Sodium (VOLTAREN) 1 % GEL Apply 1 application topically as needed.   . dicyclomine (BENTYL) 20 MG tablet Take 1 tablet (20 mg total) by mouth 3 (three) times daily as needed for spasms (abdominal cramping).  . famotidine (PEPCID) 40 MG tablet Take 1 tablet (40 mg total) by mouth 2 (two) times daily. For 12 weeks, then go back to 20mg  twice daily.   . fluticasone (FLONASE) 50 MCG/ACT nasal spray Place 1 spray into both nostrils daily as needed for allergies.   . fluvoxaMINE (LUVOX) 100 MG tablet Take 3 tablets (300 mg total) by mouth at bedtime.  . gabapentin (NEURONTIN) 600 MG tablet Take 2 tablets (1,200 mg total) by mouth 2 (two) times daily.  . hydrOXYzine (ATARAX/VISTARIL) 25 MG tablet Take 1 tablet (25 mg total) by mouth 3 (three) times daily as needed.  . hyoscyamine (LEVSIN) 0.125 MG tablet Take 1 tablet (0.125 mg total) by mouth every 4 (four) hours as needed (diarrhea).  . lidocaine-prilocaine (EMLA) cream   . lidocaine-prilocaine (EMLA) cream Apply 1 application topically as needed. Apply to area 45 min prior to procedure  . loratadine (CLARITIN) 10 MG tablet Take 10 mg by mouth daily.  . metroNIDAZOLE (METROGEL) 1 % gel Apply topically daily.  Marland Kitchen olopatadine (PATANOL) 0.1 % ophthalmic solution Place 1 drop into both eyes 2 (two) times daily.   . ondansetron (ZOFRAN ODT) 4 MG disintegrating tablet Take 1 tablet (4 mg total) by mouth every 8 (eight) hours as needed for nausea or vomiting.  . Rimegepant Sulfate (NURTEC) 75 MG TBDP Take 75 mg by mouth daily as needed (take for abortive therapy of migraine, no more than 1 tablet in 24 hours or 10 per month).  . traZODone (DESYREL) 100 MG tablet Take 1-2 tablets (100-200 mg total) by mouth at bedtime as needed for sleep.  . valACYclovir (VALTREX) 500 MG tablet Take 500 mg by mouth daily as needed (outbreak).     ROS:  Review of Systems  Constitutional: Negative.   HENT: Negative.   Eyes: Negative.   Respiratory: Negative.   Cardiovascular: Negative.   Gastrointestinal: Negative.   Genitourinary: Negative.   Musculoskeletal: Negative.   Skin: Negative.   Neurological: Negative.   Endo/Heme/Allergies: Negative.   Psychiatric/Behavioral: Negative.   All other systems reviewed and are negative.    Objective:   Today's Vitals: BP 126/85 (BP Location: Right Arm, Patient  Position: Sitting, Cuff Size: Normal)   Pulse 65   Temp (!) 97.4 F (36.3 C) (Temporal)   Resp 18   Ht 5\' 4"  (1.626 m)   Wt 161 lb (73 kg)   LMP 10/07/2011   SpO2 99%   BMI 27.64 kg/m  Vitals with BMI 08/30/2019 08/29/2019 07/14/2019  Height 5\' 4"  5\' 4"  -  Weight 161 lbs 149 lbs 15 oz -  BMI 40.10 27.25 -  Systolic 366 440 347  Diastolic 85 83 80  Pulse 65 62 78  Some encounter information is confidential and restricted. Go to Review Flowsheets activity to see all data.     Physical Exam Vitals and nursing note reviewed.  Constitutional:  Appearance: Normal appearance. She is well-developed, well-groomed and overweight.  HENT:     Head: Normocephalic and atraumatic.     Right Ear: External ear normal.     Left Ear: External ear normal.     Mouth/Throat:     Comments: Mask in place  Eyes:     General:        Right eye: No discharge.        Left eye: No discharge.     Conjunctiva/sclera: Conjunctivae normal.  Cardiovascular:     Rate and Rhythm: Normal rate and regular rhythm.     Pulses: Normal pulses.     Heart sounds: Normal heart sounds.  Pulmonary:     Effort: Pulmonary effort is normal.     Breath sounds: Normal breath sounds.  Musculoskeletal:        General: Normal range of motion.     Cervical back: Normal range of motion and neck supple.  Skin:    General: Skin is warm.  Neurological:     General: No focal deficit present.     Mental Status: She is alert and oriented to person, place, and time.  Psychiatric:        Attention and Perception: Attention normal.        Mood and Affect: Mood normal.        Speech: Speech normal.        Behavior: Behavior normal. Behavior is cooperative.        Thought Content: Thought content normal.        Cognition and Memory: Cognition normal.        Judgment: Judgment normal.    Depression screen [facility] 2/9 08/30/2019 11/15/2018 09/12/2018  Decreased Interest 1 0 2  Down, Depressed, Hopeless 0 0 2  PHQ - 2 Score 1 0  4  Altered sleeping 3 - 3  Tired, decreased energy 1 - 2  Change in appetite 0 - 3  Feeling bad or failure about yourself  3 - 3  Trouble concentrating 1 - 3  Moving slowly or fidgety/restless 0 - 0  Suicidal thoughts 0 - 0  PHQ-9 Score 9 - 18  Difficult doing work/chores Somewhat difficult - -  Some encounter information is confidential and restricted. Go to Review Flowsheets activity to see all data.  Some recent data might be hidden    GAD 7 : Generalized Anxiety Score 08/30/2019  Nervous, Anxious, on Edge 3  Control/stop worrying 3  Worry too much - different things 3  Trouble relaxing 1  Restless 1  Easily annoyed or irritable 1  Afraid - awful might happen 1  Total GAD 7 Score 13  Anxiety Difficulty Somewhat difficult       Assessment   1. Migraine without status migrainosus, not intractable, unspecified migraine type   2. Gastroesophageal reflux disease, unspecified whether esophagitis present   3. Bulimia   4. Mixed bipolar I disorder ([place])   5. Mixed obsessional thoughts and acts   6. Generalized anxiety disorder     Tests ordered No orders of the defined types were placed in this encounter.    Plan: Please see assessment and plan per problem list above.   No orders of the defined types were placed in this encounter.   Patient to follow-up in 5 months  Perlie Mayo, NP

## 2019-08-30 NOTE — Assessment & Plan Note (Signed)
Is followed by psychiatry in [place]

## 2019-08-30 NOTE — Assessment & Plan Note (Signed)
Is seeing a chiropractor again going to go for neurological to check out information about Botox injections.

## 2019-08-30 NOTE — Patient Instructions (Signed)
I appreciate the opportunity to provide you with care for your health and wellness. Today we discussed: establish care   Follow up: 5 months  No labs  Referrals today- Podiatry  Nice to meet you today.  Continue all medications as other providers have prescribed them.  Please continue to practice social distancing to keep you, your family, and our community safe.  If you must go out, please wear a mask and practice good handwashing.  It was a pleasure to see you and I look forward to continuing to work together on your health and well-being. Please do not hesitate to call the office if you need care or have questions about your care.  Have a wonderful day and week. With Gratitude, Cherly Beach, DNP, AGNP-BC

## 2019-08-30 NOTE — Assessment & Plan Note (Signed)
Is followed by psychiatry in [place] in [place] additionally is seeking out therapist that insurance covers also in [place]

## 2019-08-30 NOTE — Assessment & Plan Note (Signed)
.  Is followed by psychiatry in [place]

## 2019-08-30 NOTE — Assessment & Plan Note (Signed)
Is on Pepcid.  Reports that she continuously has upper abdominal pain that she thinks is related to her hiatal hernia

## 2019-08-30 NOTE — Assessment & Plan Note (Signed)
Is followed by psychiatry at [facility] in [place]

## 2019-08-31 ENCOUNTER — Telehealth: Payer: Self-pay

## 2019-08-31 ENCOUNTER — Ambulatory Visit: Payer: Medicare PPO | Admitting: Family Medicine

## 2019-08-31 ENCOUNTER — Encounter: Payer: Self-pay | Admitting: Family Medicine

## 2019-08-31 VITALS — BP 120/76 | HR 76 | Ht 64.0 in | Wt 156.8 lb

## 2019-08-31 DIAGNOSIS — M5033 Other cervical disc degeneration, cervicothoracic region: Secondary | ICD-10-CM | POA: Diagnosis not present

## 2019-08-31 DIAGNOSIS — G43719 Chronic migraine without aura, intractable, without status migrainosus: Secondary | ICD-10-CM | POA: Diagnosis not present

## 2019-08-31 DIAGNOSIS — M9901 Segmental and somatic dysfunction of cervical region: Secondary | ICD-10-CM | POA: Diagnosis not present

## 2019-08-31 NOTE — Telephone Encounter (Signed)
Please let pt know her cologuard is positive.  As before, we recommend a colonoscopy. Needs ov follow up if she is agreeable to colonoscopy.

## 2019-08-31 NOTE — Patient Instructions (Addendum)
We can continue Nurtec as needed. Try taking 600-800mg  of ibuprofen OR!!!! 220-440mg  Aleve (not both together) with Nurtec for intractable headaches. Only do this as needed and not on a regular basis. Take medications with food.   Consider Botox therapy. Consider sleep study.   Follow up as planned in October. If migraines continue to improve, you can cancel October appt and come back in January.    Migraine Headache A migraine headache is a very strong throbbing pain on one side or both sides of your head. This type of headache can also cause other symptoms. It can last from 4 hours to 3 days. Talk with your doctor about what things may bring on (trigger) this condition. What are the causes? The exact cause of this condition is not known. This condition may be triggered or caused by:  Drinking alcohol.  Smoking.  Taking medicines, such as: ? Medicine used to treat chest pain (nitroglycerin). ? Birth control pills. ? Estrogen. ? Some blood pressure medicines.  Eating or drinking certain products.  Doing physical activity. Other things that may trigger a migraine headache include:  Having a menstrual period.  Pregnancy.  Hunger.  Stress.  Not getting enough sleep or getting too much sleep.  Weather changes.  Tiredness (fatigue). What increases the risk?  Being 55-77 years old.  Being female.  Having a family history of migraine headaches.  Being Caucasian.  Having depression or anxiety.  Being very overweight. What are the signs or symptoms?  A throbbing pain. This pain may: ? Happen in any area of the head, such as on one side or both sides. ? Make it hard to do daily activities. ? Get worse with physical activity. ? Get worse around bright lights or loud noises.  Other symptoms may include: ? Feeling sick to your stomach (nauseous). ? Vomiting. ? Dizziness. ? Being sensitive to bright lights, loud noises, or smells.  Before you get a migraine  headache, you may get warning signs (an aura). An aura may include: ? Seeing flashing lights or having blind spots. ? Seeing bright spots, halos, or zigzag lines. ? Having tunnel vision or blurred vision. ? Having numbness or a tingling feeling. ? Having trouble talking. ? Having weak muscles.  Some people have symptoms after a migraine headache (postdromal phase), such as: ? Tiredness. ? Trouble thinking (concentrating). How is this treated?  Taking medicines that: ? Relieve pain. ? Relieve the feeling of being sick to your stomach. ? Prevent migraine headaches.  Treatment may also include: ? Having acupuncture. ? Avoiding foods that bring on migraine headaches. ? Learning ways to control your body functions (biofeedback). ? Therapy to help you know and deal with negative thoughts (cognitive behavioral therapy). Follow these instructions at home: Medicines  Take over-the-counter and prescription medicines only as told by your doctor.  Ask your doctor if the medicine prescribed to you: ? Requires you to avoid driving or using heavy machinery. ? Can cause trouble pooping (constipation). You may need to take these steps to prevent or treat trouble pooping:  Drink enough fluid to keep your pee (urine) pale yellow.  Take over-the-counter or prescription medicines.  Eat foods that are high in fiber. These include beans, whole grains, and fresh fruits and vegetables.  Limit foods that are high in fat and sugar. These include fried or sweet foods. Lifestyle  Do not drink alcohol.  Do not use any products that contain nicotine or tobacco, such as cigarettes, e-cigarettes, and chewing tobacco.  If you need help quitting, ask your doctor.  Get at least 8 hours of sleep every night.  Limit and deal with stress. General instructions      Keep a journal to find out what may bring on your migraine headaches. For example, write down: ? What you eat and drink. ? How much sleep  you get. ? Any change in what you eat or drink. ? Any change in your medicines.  If you have a migraine headache: ? Avoid things that make your symptoms worse, such as bright lights. ? It may help to lie down in a dark, quiet room. ? Do not drive or use heavy machinery. ? Ask your doctor what activities are safe for you.  Keep all follow-up visits as told by your doctor. This is important. Contact a doctor if:  You get a migraine headache that is different or worse than others you have had.  You have more than 15 headache days in one month. Get help right away if:  Your migraine headache gets very bad.  Your migraine headache lasts longer than 72 hours.  You have a fever.  You have a stiff neck.  You have trouble seeing.  Your muscles feel weak or like you cannot control them.  You start to lose your balance a lot.  You start to have trouble walking.  You pass out (faint).  You have a seizure. Summary  A migraine headache is a very strong throbbing pain on one side or both sides of your head. These headaches can also cause other symptoms.  This condition may be treated with medicines and changes to your lifestyle.  Keep a journal to find out what may bring on your migraine headaches.  Contact a doctor if you get a migraine headache that is different or worse than others you have had.  Contact your doctor if you have more than 15 headache days in a month. This information is not intended to replace advice given to you by your health care provider. Make sure you discuss any questions you have with your health care provider. Document Revised: 04/29/2018 Document Reviewed: 02/17/2018 Elsevier Patient Education  [place].   OnabotulinumtoxinA injection (Medical Use) What is this medicine? ONABOTULINUMTOXINA (o na BOTT you lye num tox in eh) is a neuro-muscular blocker. This medicine is used to treat crossed eyes, eyelid spasms, severe neck muscle spasms,  ankle and toe muscle spasms, and elbow, wrist, and finger muscle spasms. It is also used to treat excessive underarm sweating, to prevent chronic migraine headaches, and to treat loss of bladder control due to neurologic conditions such as multiple sclerosis or spinal cord injury. This medicine may be used for other purposes; ask your health care provider or pharmacist if you have questions. COMMON BRAND NAME(S): Botox What should I tell my health care provider before I take this medicine? They need to know if you have any of these conditions:  breathing problems  cerebral palsy spasms  difficulty urinating  heart problems  history of surgery where this medicine is going to be used  infection at the site where this medicine is going to be used  myasthenia gravis or other neurologic disease  nerve or muscle disease  surgery plans  take medicines that treat or prevent blood clots  thyroid problems  an unusual or allergic reaction to botulinum toxin, albumin, other medicines, foods, dyes, or preservatives  pregnant or trying to get pregnant  breast-feeding How should I use this  medicine? This medicine is for injection into a muscle. It is given by a health care professional in a hospital or clinic setting. Talk to your pediatrician regarding the use of this medicine in children. While this drug may be prescribed for children as young as 61 years old for selected conditions, precautions do apply. Overdosage: If you think you have taken too much of this medicine contact a poison control center or emergency room at once. NOTE: This medicine is only for you. Do not share this medicine with others. What if I miss a dose? This does not apply. What may interact with this medicine?  aminoglycoside antibiotics like gentamicin, neomycin, tobramycin  muscle relaxants  other botulinum toxin injections This list may not describe all possible interactions. Give your health care provider  a list of all the medicines, herbs, non-prescription drugs, or dietary supplements you use. Also tell them if you smoke, drink alcohol, or use illegal drugs. Some items may interact with your medicine. What should I watch for while using this medicine? Visit your doctor for regular check ups. This medicine will cause weakness in the muscle where it is injected. Tell your doctor if you feel unusually weak in other muscles. Get medical help right away if you have problems with breathing, swallowing, or talking. This medicine might make your eyelids droop or make you see blurry or double. If you have weak muscles or trouble seeing do not drive a car, use machinery, or do other dangerous activities. This medicine contains albumin from human blood. It may be possible to pass an infection in this medicine, but no cases have been reported. Talk to your doctor about the risks and benefits of this medicine. If your activities have been limited by your condition, go back to your regular routine slowly after treatment with this medicine. What side effects may I notice from receiving this medicine? Side effects that you should report to your doctor or health care professional as soon as possible:  allergic reactions like skin rash, itching or hives, swelling of the face, lips, or tongue  breathing problems  changes in vision  chest pain or tightness  eye irritation, pain  fast, irregular heartbeat  infection  numbness  speech problems  swallowing problems  unusual weakness Side effects that usually do not require medical attention (report to your doctor or health care professional if they continue or are bothersome):  bruising or pain at site where injected  drooping eyelid  dry eyes or mouth  headache  muscles aches, pains  sensitivity to light  tearing This list may not describe all possible side effects. Call your doctor for medical advice about side effects. You may report side  effects to FDA at 1-800-FDA-1088. Where should I keep my medicine? This drug is given in a hospital or clinic and will not be stored at home. NOTE: This sheet is a summary. It may not cover all possible information. If you have questions about this medicine, talk to your doctor, pharmacist, or health care provider.  2020 Elsevier/Gold Standard (2017-07-12 14:21:42)    Sleep Apnea Sleep apnea affects breathing during sleep. It causes breathing to stop for a short time or to become shallow. It can also increase the risk of:  Heart attack.  Stroke.  Being very overweight (obese).  Diabetes.  Heart failure.  Irregular heartbeat. The goal of treatment is to help you breathe normally again. What are the causes? There are three kinds of sleep apnea:  Obstructive sleep apnea.  This is caused by a blocked or collapsed airway.  Central sleep apnea. This happens when the brain does not send the right signals to the muscles that control breathing.  Mixed sleep apnea. This is a combination of obstructive and central sleep apnea. The most common cause of this condition is a collapsed or blocked airway. This can happen if:  Your throat muscles are too relaxed.  Your tongue and tonsils are too large.  You are overweight.  Your airway is too small. What increases the risk?  Being overweight.  Smoking.  Having a small airway.  Being older.  Being female.  Drinking alcohol.  Taking medicines to calm yourself (sedatives or tranquilizers).  Having family members with the condition. What are the signs or symptoms?  Trouble staying asleep.  Being sleepy or tired during the day.  Getting angry a lot.  Loud snoring.  Headaches in the morning.  Not being able to focus your mind (concentrate).  Forgetting things.  Less interest in sex.  Mood swings.  Personality changes.  Feelings of sadness (depression).  Waking up a lot during the night to pee (urinate).  Dry  mouth.  Sore throat. How is this diagnosed?  Your medical history.  A physical exam.  A test that is done when you are sleeping (sleep study). The test is most often done in a sleep lab but may also be done at home. How is this treated?   Sleeping on your side.  Using a medicine to get rid of mucus in your nose (decongestant).  Avoiding the use of alcohol, medicines to help you relax, or certain pain medicines (narcotics).  Losing weight, if needed.  Changing your diet.  Not smoking.  Using a machine to open your airway while you sleep, such as: ? An oral appliance. This is a mouthpiece that shifts your lower jaw forward. ? A CPAP device. This device blows air through a mask when you breathe out (exhale). ? An EPAP device. This has valves that you put in each nostril. ? A BPAP device. This device blows air through a mask when you breathe in (inhale) and breathe out.  Having surgery if other treatments do not work. It is important to get treatment for sleep apnea. Without treatment, it can lead to:  High blood pressure.  Coronary artery disease.  In men, not being able to have an erection (impotence).  Reduced thinking ability. Follow these instructions at home: Lifestyle  Make changes that your doctor recommends.  Eat a healthy diet.  Lose weight if needed.  Avoid alcohol, medicines to help you relax, and some pain medicines.  Do not use any products that contain nicotine or tobacco, such as cigarettes, e-cigarettes, and chewing tobacco. If you need help quitting, ask your doctor. General instructions  Take over-the-counter and prescription medicines only as told by your doctor.  If you were given a machine to use while you sleep, use it only as told by your doctor.  If you are having surgery, make sure to tell your doctor you have sleep apnea. You may need to bring your device with you.  Keep all follow-up visits as told by your doctor. This is  important. Contact a doctor if:  The machine that you were given to use during sleep bothers you or does not seem to be working.  You do not get better.  You get worse. Get help right away if:  Your chest hurts.  You have trouble breathing in enough  air.  You have an uncomfortable feeling in your back, arms, or stomach.  You have trouble talking.  One side of your body feels weak.  A part of your face is hanging down. These symptoms may be an emergency. Do not wait to see if the symptoms will go away. Get medical help right away. Call your local emergency services (911 in the [place] Do not drive yourself to the hospital. Summary  This condition affects breathing during sleep.  The most common cause is a collapsed or blocked airway.  The goal of treatment is to help you breathe normally while you sleep. This information is not intended to replace advice given to you by your health care provider. Make sure you discuss any questions you have with your health care provider. Document Revised: 10/22/2017 Document Reviewed: 08/31/2017 Elsevier Patient Education  [place].

## 2019-08-31 NOTE — Progress Notes (Signed)
PATIENT: Jennifer [name] DOB: [birth year]  REASON FOR VISIT: follow up HISTORY FROM: patient  Chief Complaint  Patient presents with  . Follow-up    F/U on migraines. Went to [facility] for migraine on Tuesday, states she has been feeling better since she received Toradol shot.   . room 7    alone      HISTORY OF PRESENT ILLNESS: Today 08/31/19 Jennifer [name] is a 54 y.o. female here today for follow up for migraines. She reports that headaches have improved some. She has had 2-3 migraines over the past 2-3 months. She was seen on 8/10 at [facility] for intractable migraine and was given Toradol/ondancetron injection. Amovig was prescribed in 04/2019. She felt that it helped but she could not tolerate skin itching. She reports that her skin would hurt. She tried using Benadryl but reports she gets jittery when she takes it. Symptoms resolved when discontinued. She feels that stress is usually main trigger for migraines. She continues to see psychiatry. She is also seeing a chiropractor and feels this is helping. She does report snoring and insomnia. She wakes with headaches from time to time. She was advised to consider sleep study in past. She is hesitant to consider Botox therapy.    She has tried and failed topiramate (low BP and dizziness), divalproex (hair loss), Emgality (rash, no respiratory symptoms), Amovig (itching), on gabapentin 1200mg  BID now for anxiety, Imitrex (ineffective), taking Nurtec now for abortive therapy.   HISTORY: (copied from my note on 05/10/2019)  Jennifer [name] is a 54 y.o. female here today for follow up for migraines. She has discontinued divalproex d/t hair loss. Headaches have been somewhat worse. She is having daily headaches. She has 2-3 "severe headaches" per week with pounding pain, light sensitivity and nausea. She will try taking 2 tylenol. She averages at least 2-6 tablets of Tylenol every day. Sometimes this helps and sometimes it doesn't. She has tried [employer] and  feels this helps. She has also taken Benadryl which helps.   She has tried and failed topiramate (low BP and dizziness), divalproex (hair loss), Emgality (rash, no respiratory symptoms), on gabapentin 1200mg  BID now for anxiety), Imitrex (ineffective).   She is followed closely by PCP and psychiatry. She is having a lot of mood swings. She is taking Latuda 80mg  daily and gabapentin 1200mg  twice daily. She is scared and anxious as medications haven't been working well. She reports having labs recently to determine which medications may work better for her.   HISTORY: (copied from my note on 02/06/2019)  Jennifer Jelinek Hodgesis a 54 y.o.femalehere today for follow up for headaches. She was started on divalproex ER 500mg  at bedtime in 09/2018. She reports that headaches did improve for a few months. Over the past month or so, she has noticed more headaches. She recently started nicotine patches for smoking cessation. She feels this may be correlating. She is also having neck pain and not sleeping well. She has a migraine today, pounding pain with light sensitivity. She usually takes Imitrex that does help but she has been out of medication.She is staying well-hydrated. She states that stress is definitely contributing to her symptoms. She is followed closely by primary care and psychiatry.   HISTORY: (copied fromDr Dohmeier'snote on 10/04/2018)  Jennifer H Hodgesis a 54 y.o.femaleIs seen here as a referral/ revisit from Dr. Sylvan Cheese a headache that she has identified as migraines.   Her headaches are either in the temporal skull, sometimes in the  right ( most often 0 sometimes in the left/. But she has sometimes neck pain, too. Migraine can last 3 days, photophobia bothers her, sounds, too. She has nausea.  She used Maxalt with less and less effect, after years of good control. She is allergic to Topamax, she believes it caused her to have HTN. Propanolol was tried, and affected her  memory. Depakote was tried with psychiatry- She is not sure why it was discontinued, may be it interfered with Latuda. She is afraid of weight gain.  She hs seen Noelle Redmon at [place], who wanted her to take [facility] in June 2020 and had given herself one more dose in July .and developed hives, whelps and joint pain. She attributed all this to the [facility].  She felt " forced " to take the shots, concerned about effect on her depression- and the psychiatric medications.  She wants to resume her sucessfull migraine treatment of the past: Toradol, steroids injections " into her hip".She has to be careful with steroids due to "ulcers in her stomach".   For the last 2 month she has been sleeping better after a stressful summer. She has an eating disorder , has been hospitalized 6 times for bulemia, but reports 6 days without binging and purging. New therapist since she is on medicare.    REVIEW OF SYSTEMS: Out of a complete 14 system review of symptoms, the patient complains only of the following symptoms, headaches, anxiety, depression and all other reviewed systems are negative.  ALLERGIES: Allergies  Allergen Reactions  . Codeine Shortness Of Breath, Nausea Only and Rash  . Meloxicam Other (See Comments)    Possible chest tightness - instructed by MD not to take  . Sonata [Zaleplon] Other (See Comments)    Hallucinations  . Topamax [Topiramate] Other (See Comments)    Low BP and dizziness  . Aimovig [Erenumab-Aooe] Itching  . Emgality [Galcanezumab-Gnlm] Rash  . Penicillins Hives, Swelling and Rash    Has patient had a PCN reaction causing immediate rash, facial/tongue/throat swelling, SOB or lightheadedness with hypotension: yes Has patient had a PCN reaction causing severe rash involving mucus membranes or skin necrosis: no Has patient had a PCN reaction that required hospitalization: no Has patient had a PCN reaction occurring within the last 10 years: no If all of the above  answers are "NO", then may proceed with Cephalosporin use.;     HOME MEDICATIONS: Outpatient Medications Prior to Visit  Medication Sig Dispense Refill  . acetaminophen (TYLENOL) 500 MG tablet Take 1,000 mg by mouth every 6 (six) hours as needed for moderate pain or headache.    . albuterol (VENTOLIN HFA) 108 (90 Base) MCG/ACT inhaler Inhale 1-2 puffs into the lungs every 6 (six) hours as needed for wheezing or shortness of breath. 18 g 0  . Brexpiprazole ([name]) 3 MG TABS Take 1 tablet (3 mg total) by mouth in the morning. 30 tablet 1  . clonazePAM (KLONOPIN) 0.5 MG tablet Take 1 tablet (0.5 mg total) by mouth 2 (two) times daily as needed for anxiety. 20 tablet 5  . cyclobenzaprine (FLEXERIL) 10 MG tablet Take by mouth.    . cycloSPORINE (RESTASIS) 0.05 % ophthalmic emulsion Place 1 drop into both eyes 2 (two) times daily.     . diclofenac Sodium (VOLTAREN) 1 % GEL Apply 1 application topically as needed.     . dicyclomine (BENTYL) 20 MG tablet Take 1 tablet (20 mg total) by mouth 3 (three) times daily as needed for spasms (  abdominal cramping). 20 tablet 0  . famotidine (PEPCID) 40 MG tablet Take 1 tablet (40 mg total) by mouth 2 (two) times daily. For 12 weeks, then go back to 20mg  twice daily. 60 tablet 3  . fluticasone (FLONASE) 50 MCG/ACT nasal spray Place 1 spray into both nostrils daily as needed for allergies.     . fluvoxaMINE (LUVOX) 100 MG tablet Take 3 tablets (300 mg total) by mouth at bedtime. 270 tablet 1  . gabapentin (NEURONTIN) 600 MG tablet Take 2 tablets (1,200 mg total) by mouth 2 (two) times daily. 120 tablet 5  . hydrOXYzine (ATARAX/VISTARIL) 25 MG tablet Take 1 tablet (25 mg total) by mouth 3 (three) times daily as needed. 60 tablet 1  . hyoscyamine (LEVSIN) 0.125 MG tablet Take 1 tablet (0.125 mg total) by mouth every 4 (four) hours as needed (diarrhea). 120 tablet 1  . lidocaine-prilocaine (EMLA) cream Apply 1 application topically as needed. Apply to area 45 min  prior to procedure 5 g 0  . loratadine (CLARITIN) 10 MG tablet Take 10 mg by mouth daily.    . metroNIDAZOLE (METROGEL) 1 % gel Apply topically daily. 45 g 0  . olopatadine (PATANOL) 0.1 % ophthalmic solution Place 1 drop into both eyes 2 (two) times daily.     . ondansetron (ZOFRAN ODT) 4 MG disintegrating tablet Take 1 tablet (4 mg total) by mouth every 8 (eight) hours as needed for nausea or vomiting. 30 tablet 1  . Rimegepant Sulfate (NURTEC) 75 MG TBDP Take 75 mg by mouth daily as needed (take for abortive therapy of migraine, no more than 1 tablet in 24 hours or 10 per month). 10 tablet 11  . traZODone (DESYREL) 100 MG tablet Take 1-2 tablets (100-200 mg total) by mouth at bedtime as needed for sleep. 60 tablet 5  . valACYclovir (VALTREX) 500 MG tablet Take 500 mg by mouth daily as needed (outbreak).     Marland Kitchen lidocaine-prilocaine (EMLA) cream      No facility-administered medications prior to visit.    PAST MEDICAL HISTORY: Past Medical History:  Diagnosis Date  . Abdominal pain, epigastric 10/07/2018  . Anxiety   . Binge-eating and purging type anorexia nervosa   . Bipolar disorder ([place])   . Cellulitis and abscess of buttock 02/15/2013  . Cystitis, interstitial   . Depression   . Esophageal polyp    about 20 years ago  . GERD (gastroesophageal reflux disease)   . Left wrist pain 03/22/2019  . OCD (obsessive compulsive disorder)   . Palpitations 05/25/2014  . Rib pain on right side 01/31/2019  . Rosacea 03/27/2019  . Sprain of temporomandibular joint or ligament 01/10/2019    PAST SURGICAL HISTORY: Past Surgical History:  Procedure Laterality Date  . BLADDER SURGERY    . CHOLECYSTECTOMY    . ESOPHAGOGASTRODUODENOSCOPY  09/28/2016   [facility]; Dr. Therisa Doyne; erosions in the esophagus, 5 cm hiatal hernia, nonbleeding erosive gastropathy s/p biopsied, normal duodenum.  Path with chronic inactive gastritis, no H. pylori or intestinal metaplasia.   Marland Kitchen ESOPHAGOGASTRODUODENOSCOPY (EGD) WITH  PROPOFOL N/A 10/17/2018   Dr. Gala Romney: mild reflux esophagitis, small hiatal hernia  . VOCAL CORD LATERALIZATION, ENDOSCOPIC APPROACH W/ MLB      FAMILY HISTORY: Family History  Problem Relation Age of Onset  . Healthy Mother   . Healthy Father   . Colon cancer Neg Hx   . Colon polyps Neg Hx     SOCIAL HISTORY: Social History   Socioeconomic History  .  Marital status: Married    Spouse name: [name]  . Number of children: 0  . Years of education: Not on file  . Highest education level: Not on file  Occupational History  . Not on file  Tobacco Use  . Smoking status: Current Every Day Smoker    Packs/day: 1.00    Years: 20.00    Pack years: 20.00    Types: Cigarettes    Start date: 01/20/1988  . Smokeless tobacco: Never Used  Vaping Use  . Vaping Use: Never used  Substance and Sexual Activity  . Alcohol use: No    Alcohol/week: 0.0 standard drinks  . Drug use: No  . Sexual activity: Yes    Birth control/protection: None  Other Topics Concern  . 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 Difficulty of Paying Living Expenses: Not hard at all  Food Insecurity: No Food Insecurity  . Worried About Charity fundraiser in the Last Year: Never true  . Ran Out of Food in the Last Year: Never true  Transportation Needs: No Transportation Needs  . Lack of Transportation (Medical): No  . Lack of Transportation (Non-Medical): No  Physical Activity: Inactive  . Days of Exercise per Week: 0 days  . Minutes of Exercise per Session: 0 min  Stress: No  Stress Concern Present  . Feeling of Stress : Only a little  Social Connections: Moderately Isolated  . Frequency of Communication with Friends and Family: Once a week  . Frequency of Social Gatherings with Friends and Family: Once a week  . Attends Religious Services: More than 4 times per year  . Active Member of Clubs or Organizations: No  . Attends Archivist Meetings: Never  . Marital Status: Married  Human resources officer Violence: Not At Risk  . Fear of Current or Ex-Partner: No  . Emotionally Abused: No  . Physically Abused: No  . Sexually Abused: No      PHYSICAL EXAM  Vitals:   08/31/19 1110  BP: 120/76  Pulse: 76  Weight: 156 lb 12.8 oz (71.1 kg)  Height: 5\' 4"  (1.626 m)   Body mass index is 26.91 kg/m.  Generalized: Well developed, in no acute distress  Cardiology: normal rate and rhythm, no murmur noted Respiratory: clear to auscultation bilaterally  Neurological examination  Mentation: Alert oriented to time, place, history taking. Follows all commands speech and language fluent Cranial nerve II-XII: Pupils were equal round reactive to light. Extraocular movements were full, visual field were full  Motor: The motor testing reveals 5 over 5 strength of all 4 extremities. Good symmetric motor tone is noted throughout.  Gait and station: Gait is normal.   DIAGNOSTIC DATA (LABS, IMAGING, TESTING) - I reviewed patient records, labs, notes, testing and imaging myself where available.  No flowsheet data found.   Lab Results  Component Value Date   WBC 6.0 03/20/2019   HGB 13.4 03/20/2019   HCT 40.1 03/20/2019   MCV 91.8 03/20/2019   PLT 187 03/20/2019      Component Value Date/Time   NA 135 03/20/2019 1233   NA 135 02/06/2019 1414   K 3.4 (L) 03/20/2019 1233   CL 104 03/20/2019 1233   CO2 24 03/20/2019 1233   GLUCOSE 127 (H) 03/20/2019 1233   BUN 16 03/20/2019 1233   BUN 14 02/06/2019 1414   CREATININE 0.83 03/20/2019 1233   CREATININE 0.76  12/06/2017 1142   CALCIUM 8.9 03/20/2019 1233   PROT 7.1 03/20/2019 1233   PROT 6.8 02/06/2019 1414   ALBUMIN 4.2 03/20/2019 1233   ALBUMIN 4.6 02/06/2019 1414   AST 23 03/20/2019 1233   ALT 22 03/20/2019 1233   ALKPHOS 72 03/20/2019 1233   BILITOT 0.6 03/20/2019 1233   BILITOT 0.3 02/06/2019 1414   GFRNONAA >60 03/20/2019 1233   GFRNONAA 90 12/06/2017 1142   GFRAA >60 03/20/2019 1233   GFRAA 105 12/06/2017 1142   Lab Results  Component Value Date   CHOL 170 12/06/2015   HDL 61 12/06/2015   LDLCALC 90 12/06/2015   TRIG 95 12/06/2015   CHOLHDL 2.8 12/06/2015   Lab Results  Component Value Date   HGBA1C 5.7 (H) 03/20/2019   No results found for: VITAMINB12 Lab Results  Component Value Date   TSH 1.392 12/06/2015       ASSESSMENT AND PLAN 54 y.o. year old female  has a past medical history of Abdominal pain, epigastric (10/07/2018), Anxiety, Binge-eating and purging type anorexia nervosa, Bipolar disorder ([place]), Cellulitis and abscess of buttock (02/15/2013), Cystitis, interstitial, Depression, Esophageal polyp, GERD (gastroesophageal reflux disease), Left wrist pain (03/22/2019), OCD (obsessive compulsive disorder), Palpitations (05/25/2014), Rib pain on right side (01/31/2019), Rosacea (03/27/2019), and Sprain of temporomandibular joint or ligament (01/10/2019). here with     ICD-10-CM   1. Intractable chronic migraine without aura and without status migrainosus  G43.719     Jennifer [name] does report some improvement in headaches since being seen by a chiropractor. She feels that Nurtec helps some but she continues to have about 1 intractable migraine every month. Toradol helps. I have advised that she take Nurtec with either 600-800mg  on ibuprofen or 220-440mg  of Aleve. She agrees and will try this with next intractable headache. She was encouraged to consider Botox for prevention. She is nervous about possible side effects and pain. She was also advised to consider sleep study due to  snoring and morning headaches. She was encouraged to continue healthy lifestyle habits. She will continue follow up with PCP and psychiatry. She will follow up with me in 10/2019 as already scheduled.   No orders of the defined types were placed in this encounter.    No orders of the defined types were placed in this encounter.     I spent 15 minutes with the patient. 50% of this time was spent counseling and educating patient on plan of care and medications.    Debbora Presto, FNP-C 08/31/2019, 11:30 AM [facility] [address], [place], [place] [address] [phone]

## 2019-08-31 NOTE — Telephone Encounter (Signed)
Spoke with pt. Pt notified of positive cologuard. Pt wanted to schedule the soonest appointment. Pt was scheduled for 09/05/19 with EG.

## 2019-08-31 NOTE — Telephone Encounter (Signed)
Received a call from [employer]. They faxed over an abnormal cologuard was faxed over yesterday 08/30/19. If Neil Crouch, PA hasn't received it, I can call to request another copy at [phone].

## 2019-09-01 ENCOUNTER — Telehealth (INDEPENDENT_AMBULATORY_CARE_PROVIDER_SITE_OTHER): Payer: Medicare PPO | Admitting: Physician Assistant

## 2019-09-01 ENCOUNTER — Encounter: Payer: Self-pay | Admitting: Physician Assistant

## 2019-09-01 DIAGNOSIS — G43909 Migraine, unspecified, not intractable, without status migrainosus: Secondary | ICD-10-CM

## 2019-09-01 DIAGNOSIS — F509 Eating disorder, unspecified: Secondary | ICD-10-CM | POA: Diagnosis not present

## 2019-09-01 DIAGNOSIS — F319 Bipolar disorder, unspecified: Secondary | ICD-10-CM | POA: Diagnosis not present

## 2019-09-01 DIAGNOSIS — G4700 Insomnia, unspecified: Secondary | ICD-10-CM | POA: Diagnosis not present

## 2019-09-01 DIAGNOSIS — F429 Obsessive-compulsive disorder, unspecified: Secondary | ICD-10-CM | POA: Diagnosis not present

## 2019-09-01 DIAGNOSIS — F411 Generalized anxiety disorder: Secondary | ICD-10-CM | POA: Diagnosis not present

## 2019-09-01 MED ORDER — REXULTI 3 MG PO TABS: 3.0000 mg | ORAL_TABLET | Freq: Every morning | ORAL | 2 refills | Status: DC

## 2019-09-01 NOTE — Progress Notes (Signed)
[facility]  Patient ID: Jennifer [name],  MRN: [id number]  PCP: Perlie Mayo, NP  Date of Evaluation: 09/01/2019 Time spent:20 minutes  Chief Complaint:  Chief Complaint    Follow-up      Virtual Visit via Telehealth  I connected with patient by a video enabled telemedicine application, with their informed consent, and verified patient privacy and that I am speaking with the correct person using two identifiers.  I am private, in my office and the patient is at home.  I discussed the limitations, risks, security and privacy concerns of performing an evaluation and management service by video and the availability of in person appointments. I also discussed with the patient that there may be a patient responsible charge related to this service. The patient expressed understanding and agreed to proceed.   I discussed the assessment and treatment plan with the patient. The patient was provided an opportunity to ask questions and all were answered. The patient agreed with the plan and demonstrated an understanding of the instructions.   The patient was advised to call back or seek an in-person evaluation if the symptoms worsen or if the condition fails to improve as anticipated.  I provided 20  minutes of non-face-to-face time during this encounter.  HISTORY/CURRENT STATUS: HPI For routine med check.  Doing well since being on the [place].  States her moods are not as "up and down" as they used to be, when she was on the [place].  She does get anxious and upset when things are not going well, as an example, her husband got upset with her when she told him about a conversation she had with a friend and he was mad because she was talking about him to her friend.  "He is trying to keep me from talking to people or seeing my friends or anything.  We do not really have a relationship."  Also she was doing a sewing project for someone recently and she got anxious about that because  it was a project that had to be done that same day.  She had to take half of a Klonopin to help get through it.  Another thing that made her anxious yesterday as she was called and told that her Cologuard was positive.  She states the nurse told her it could be caused by either polyps or cancer.  So she fixated on the cancer part and cried a lot yesterday out of worry.  She will be seeing a new counselor, Delton See in early September.  She had an appointment last week with Trey Paula but during the appointment Lattie Haw stated she is not equipped to counsel people who had eating disorders so she had to look for this other counselor.  Overall she is able to enjoy things.  Energy and motivation are good.  She is not isolating.  Still cries easily at times but it is usually triggered, as in the example above.  No suicidal or homicidal thoughts.  Patient denies increased energy with decreased need for sleep, no increased talkativeness, no racing thoughts, no impulsivity or risky behaviors, no increased spending, no increased libido, no grandiosity,  No hallucinations or paranoia.  Denies dizziness, syncope, seizures, numbness, tingling, tremor, tics, unsteady gait, slurred speech, confusion. Denies muscle or joint pain, stiffness, or dystonia.Denies unexplained weight loss, frequent infections, or sores that heal slowly.  No polyphagia, polydipsia, or polyuria. Denies visual changes or paresthesias.   Individual Medical History/ Review of Systems: Changes? :Yes  Had to go to Urgent Care recently b/c migraine. Recent Cologuard positive.  She will follow-up with GI.  Past medications for mental health diagnoses include: Risperdal, Seroquel, Prozac, Zoloft, Wellbutrin, Lamictal, Depakote caused hair loss,Xanax, Ambien, trazodone, Trileptal, Luvox, Topamax, Elavil, Pamelor, BuSpar, doxazosin, prazosin, lithium, Vraylar, Abilify, [name], Latuda >60 mg caused abd pain and nausea  Allergies: Codeine,  Meloxicam, Sonata [zaleplon], Topamax [topiramate], Aimovig [erenumab-aooe], Emgality [galcanezumab-gnlm], and Penicillins  Current Medications:  Current Outpatient Medications:    acetaminophen (TYLENOL) 500 MG tablet, Take 1,000 mg by mouth every 6 (six) hours as needed for moderate pain or headache., Disp: , Rfl:    albuterol (VENTOLIN HFA) 108 (90 Base) MCG/ACT inhaler, Inhale 1-2 puffs into the lungs every 6 (six) hours as needed for wheezing or shortness of breath., Disp: 18 g, Rfl: 0   Brexpiprazole ([name]) 3 MG TABS, Take 1 tablet (3 mg total) by mouth in the morning., Disp: 30 tablet, Rfl: 2   clonazePAM (KLONOPIN) 0.5 MG tablet, Take 1 tablet (0.5 mg total) by mouth 2 (two) times daily as needed for anxiety., Disp: 20 tablet, Rfl: 5   cycloSPORINE (RESTASIS) 0.05 % ophthalmic emulsion, Place 1 drop into both eyes 2 (two) times daily. , Disp: , Rfl:    diclofenac Sodium (VOLTAREN) 1 % GEL, Apply 1 application topically as needed. , Disp: , Rfl:    dicyclomine (BENTYL) 20 MG tablet, Take 1 tablet (20 mg total) by mouth 3 (three) times daily as needed for spasms (abdominal cramping)., Disp: 20 tablet, Rfl: 0   famotidine (PEPCID) 40 MG tablet, Take 1 tablet (40 mg total) by mouth 2 (two) times daily. For 12 weeks, then go back to 20mg  twice daily., Disp: 60 tablet, Rfl: 3   fluticasone (FLONASE) 50 MCG/ACT nasal spray, Place 1 spray into both nostrils daily as needed for allergies. , Disp: , Rfl:    fluvoxaMINE (LUVOX) 100 MG tablet, Take 3 tablets (300 mg total) by mouth at bedtime., Disp: 270 tablet, Rfl: 1   gabapentin (NEURONTIN) 600 MG tablet, Take 2 tablets (1,200 mg total) by mouth 2 (two) times daily., Disp: 120 tablet, Rfl: 5   hydrOXYzine (ATARAX/VISTARIL) 25 MG tablet, Take 1 tablet (25 mg total) by mouth 3 (three) times daily as needed., Disp: 60 tablet, Rfl: 1   hyoscyamine (LEVSIN) 0.125 MG tablet, Take 1 tablet (0.125 mg total) by mouth every 4 (four) hours as  needed (diarrhea)., Disp: 120 tablet, Rfl: 1   lidocaine-prilocaine (EMLA) cream, Apply 1 application topically as needed. Apply to area 45 min prior to procedure, Disp: 5 g, Rfl: 0   loratadine (CLARITIN) 10 MG tablet, Take 10 mg by mouth daily., Disp: , Rfl:    metroNIDAZOLE (METROGEL) 1 % gel, Apply topically daily., Disp: 45 g, Rfl: 0   olopatadine (PATANOL) 0.1 % ophthalmic solution, Place 1 drop into both eyes 2 (two) times daily. , Disp: , Rfl:    ondansetron (ZOFRAN ODT) 4 MG disintegrating tablet, Take 1 tablet (4 mg total) by mouth every 8 (eight) hours as needed for nausea or vomiting., Disp: 30 tablet, Rfl: 1   Rimegepant Sulfate (NURTEC) 75 MG TBDP, Take 75 mg by mouth daily as needed (take for abortive therapy of migraine, no more than 1 tablet in 24 hours or 10 per month)., Disp: 10 tablet, Rfl: 11   traZODone (DESYREL) 100 MG tablet, Take 1-2 tablets (100-200 mg total) by mouth at bedtime as needed for sleep., Disp: 60 tablet, Rfl: 5  valACYclovir (VALTREX) 500 MG tablet, Take 500 mg by mouth daily as needed (outbreak). , Disp: , Rfl:    cyclobenzaprine (FLEXERIL) 10 MG tablet, Take by mouth. (Patient not taking: Reported on 09/01/2019), Disp: , Rfl:  Medication Side Effects: none  Family Medical/ Social History: Changes? No  MENTAL HEALTH EXAM:  Last menstrual period 10/07/2011.There is no height or weight on file to calculate BMI.  General Appearance: Casual, Neat and Well Groomed  Eye Contact:  Good  Speech:  Clear and Coherent and Normal Rate  Volume:  Normal  Mood:  Euthymic  Affect:  Appropriate  Thought Process:  Goal Directed and Descriptions of Associations: Intact  Orientation:  Full (Time, Place, and Person)  Thought Content: Logical   Suicidal Thoughts:  No  Homicidal Thoughts:  No  Memory:  WNL  Judgement:  Good  Insight:  Good  Psychomotor Activity:  Normal from what I can see on video  Concentration:  Concentration: Good  Recall:  Good  Fund  of Knowledge: Good  Language: Good  Assets:  Desire for Improvement  ADL's:  Intact  Cognition: WNL  Prognosis:  Good    DIAGNOSES:    ICD-10-CM   1. Bipolar I disorder ([place])  F31.9   2. Obsessive-compulsive disorder, unspecified type  F42.9   3. Eating disorder, unspecified type  F50.9   4. Generalized anxiety disorder  F41.1   5. Insomnia, unspecified type  G47.00   6. Migraine without status migrainosus, not intractable, unspecified migraine type  G43.909     Receiving Psychotherapy: No    Will start in a few weeks                         RECOMMENDATIONS:  PDMP reviewed.  I provided 20 minutes of nonface-to-face care during this encounter.  Tried to reassure her that there are many causes of having a positive Cologuard, including certain medications although she does not take NSAIDs, hemorrhoids, polyps, she has a history of fissures so that can do it, and cancer should be the last thing on her mind right now although it does need to be ruled out.  Continue [name] 3 mg, 1 p.o. daily. Continue Klonopin 0.5 mg, 1 p.o. twice daily as needed.  She takes pretty rarely. Continue Luvox 100 mg, 3 p.o. nightly. Continue gabapentin 600 mg, 2 p.o. twice daily. Decrease hydroxyzine to 25 mg, 1/2-1 every 8 hours as needed anxiety. Continue trazodone 100 mg, 1.5 pills nightly.  Starting therapy next week. Return in [place].   [name], PA-C

## 2019-09-04 DIAGNOSIS — M9901 Segmental and somatic dysfunction of cervical region: Secondary | ICD-10-CM | POA: Diagnosis not present

## 2019-09-04 DIAGNOSIS — M5033 Other cervical disc degeneration, cervicothoracic region: Secondary | ICD-10-CM | POA: Diagnosis not present

## 2019-09-05 ENCOUNTER — Other Ambulatory Visit: Payer: Self-pay

## 2019-09-05 ENCOUNTER — Telehealth: Payer: Self-pay

## 2019-09-05 ENCOUNTER — Ambulatory Visit: Payer: Medicare PPO | Admitting: Psychology

## 2019-09-05 ENCOUNTER — Ambulatory Visit: Payer: Medicare PPO | Admitting: Nurse Practitioner

## 2019-09-05 ENCOUNTER — Encounter: Payer: Self-pay | Admitting: Nurse Practitioner

## 2019-09-05 VITALS — BP 121/81 | HR 68 | Temp 97.8°F | Ht 64.0 in | Wt 158.6 lb

## 2019-09-05 DIAGNOSIS — F502 Bulimia nervosa, unspecified: Secondary | ICD-10-CM

## 2019-09-05 DIAGNOSIS — R195 Other fecal abnormalities: Secondary | ICD-10-CM | POA: Diagnosis not present

## 2019-09-05 DIAGNOSIS — R197 Diarrhea, unspecified: Secondary | ICD-10-CM

## 2019-09-05 DIAGNOSIS — K219 Gastro-esophageal reflux disease without esophagitis: Secondary | ICD-10-CM | POA: Diagnosis not present

## 2019-09-05 NOTE — Progress Notes (Signed)
Referring Provider: Perlie Mayo, NP Primary Care Physician:  Perlie Mayo, NP Primary GI:  Dr. Gala Romney  Chief Complaint  Patient presents with  . Positive cologuard    4 bm's a day, consistency different in size and shape    HPI:   Jennifer [name] is a 54 y.o. female who presents for follow-up on positive Cologuard.  The patient was last seen in our office 05/03/2019 for epigastric pain, GERD, diarrhea.  Medical history significant for anxiety, binge eating/purging, OCD, bipolar disorder, interstitial cystitis.  EGD in September 2020 with mild reflux esophagitis and small hiatal hernia.  Worsening abdominal pain associated with induced vomiting.  Known history of IBS and anorectal fissures.  Previous attempted colonoscopy canceled because patient could not go 4 to 5 hours without eating because of worsening abdominal pain.  Previously felt IBS-D potentially worsened by pantoprazole so she was switched to AcipHex which she stated caused constipation so she was switched to Dexilant which she states made her "really sick" with a lot of diarrhea.  At that point she was advised to continue Pepcid twice a day with Mylanta and Carafate as needed.  CT renal stone study for right flank and lower back pain and found moderate chronic hiatal hernia.  LFTs and CBC unremarkable.  At her last visit noted no purging for 6 months and doing well.  However, 1 month ago she began purging again that has occurred 5-6 times.  Also noted 10 episodes of early morning waking with dry heaves.  Noted increased stress related to mother and spouse.  Stop Pepcid for a month.  Abdominal pain returned with purging.  Using Pepcid 20 mg twice daily and Carafate once daily for the past month.  No hematemesis.  Intermittent diarrhea over the weekend that she thought may have been contaminated food or food intolerance.  2-3 flares of genital herpes in the last month using Valtrex.  Has not tried Levsin or Bentyl for diarrhea.   Heartburn typically well managed with Pepcid.  Noted 16 to 17 pound weight loss since January 2021.  Recommended increase Pepcid to 40 mg twice daily for 3 months then reduce to 20 mg twice daily.  Use Bentyl or Levsin as needed.  Colonoscopy is scheduled.  She called to cancel her procedure, scheduled for 08/01/2019.  She noted problems with hiatal hernia and taking medicines prescribed, recent change in depression/anxiety meds have not helped.  Seeing therapist in August.  Afraid she will not be able to do prep due to abdominal pain if no eating.  Recommended proceed with Cologuard.  Unfortunately her Cologuard came back positive.  Recommended colonoscopy.  Will need office visit to schedule.  Today she states she is doing okay overall.  She notes she has not seen any rectal bleeding in the last year. GERD doing very well on Pepcid. Not needing Carafate. Not taking Levsin or bentyl as she hasn't needed it recently. Typically 4 stools a day, variable in consistency from [place] 1 to 6. Denies melena. Abdominal pain is intermittent with her bulimia. Hasn't seen a therapist, has been set up with another therapist and has first visit in September; is seeing psychiatry. Still purging about about 3-4 times a night, not as often as previously. Does have some esophageal discomfort associated with this. Denies fever, chills, unintentional weight loss. Denies URI or flu-like symptoms. Denies loss of sense of taste or smell. The patient has received COVID-19 vaccination(s). Denies chest pain, dyspnea, dizziness, lightheadedness, syncope,  near syncope. Denies any other upper or lower GI symptoms.  She is on Aleve prn.  Past Medical History:  Diagnosis Date  . Abdominal pain, epigastric 10/07/2018  . Anxiety   . Binge-eating and purging type anorexia nervosa   . Bipolar disorder ([place])   . Cellulitis and abscess of buttock 02/15/2013  . Cystitis, interstitial   . Depression   . Esophageal polyp    about 20  years ago  . GERD (gastroesophageal reflux disease)   . Left wrist pain 03/22/2019  . OCD (obsessive compulsive disorder)   . Palpitations 05/25/2014  . Rib pain on right side 01/31/2019  . Rosacea 03/27/2019  . Sprain of temporomandibular joint or ligament 01/10/2019    Past Surgical History:  Procedure Laterality Date  . BLADDER SURGERY    . CHOLECYSTECTOMY    . ESOPHAGOGASTRODUODENOSCOPY  09/28/2016   [facility]; Dr. Therisa Doyne; erosions in the esophagus, 5 cm hiatal hernia, nonbleeding erosive gastropathy s/p biopsied, normal duodenum.  Path with chronic inactive gastritis, no H. pylori or intestinal metaplasia.   Marland Kitchen ESOPHAGOGASTRODUODENOSCOPY (EGD) WITH PROPOFOL N/A 10/17/2018   Dr. Gala Romney: mild reflux esophagitis, small hiatal hernia  . VOCAL CORD LATERALIZATION, ENDOSCOPIC APPROACH W/ MLB      Current Outpatient Medications  Medication Sig Dispense Refill  . acetaminophen (TYLENOL) 500 MG tablet Take 1,000 mg by mouth as needed for moderate pain or headache.     . albuterol (VENTOLIN HFA) 108 (90 Base) MCG/ACT inhaler Inhale 1-2 puffs into the lungs every 6 (six) hours as needed for wheezing or shortness of breath. 18 g 0  . Brexpiprazole ([name]) 3 MG TABS Take 1 tablet (3 mg total) by mouth in the morning. 30 tablet 2  . clonazePAM (KLONOPIN) 0.5 MG tablet Take 1 tablet (0.5 mg total) by mouth 2 (two) times daily as needed for anxiety. (Patient taking differently: Take 0.5 mg by mouth as needed for anxiety. ) 20 tablet 5  . cycloSPORINE (RESTASIS) 0.05 % ophthalmic emulsion Place 1 drop into both eyes 2 (two) times daily.     . diclofenac Sodium (VOLTAREN) 1 % GEL Apply 1 application topically as needed.     . dicyclomine (BENTYL) 20 MG tablet Take 1 tablet (20 mg total) by mouth 3 (three) times daily as needed for spasms (abdominal cramping). (Patient taking differently: Take 20 mg by mouth as needed for spasms (abdominal cramping). ) 20 tablet 0  . famotidine (PEPCID) 40 MG tablet Take 1  tablet (40 mg total) by mouth 2 (two) times daily. For 12 weeks, then go back to 38m twice daily. 60 tablet 3  . fluticasone (FLONASE) 50 MCG/ACT nasal spray Place 1 spray into both nostrils daily as needed for allergies.     . fluvoxaMINE (LUVOX) 100 MG tablet Take 3 tablets (300 mg total) by mouth at bedtime. 270 tablet 1  . gabapentin (NEURONTIN) 600 MG tablet Take 2 tablets (1,200 mg total) by mouth 2 (two) times daily. 120 tablet 5  . hydrOXYzine (ATARAX/VISTARIL) 25 MG tablet Take 1 tablet (25 mg total) by mouth 3 (three) times daily as needed. (Patient taking differently: Take 25 mg by mouth as needed. ) 60 tablet 1  . hyoscyamine (LEVSIN) 0.125 MG tablet Take 1 tablet (0.125 mg total) by mouth every 4 (four) hours as needed (diarrhea). (Patient taking differently: Take 0.125 mg by mouth as needed (diarrhea). ) 120 tablet 1  . lidocaine-prilocaine (EMLA) cream Apply 1 application topically as needed. Apply to area 45  min prior to procedure 5 g 0  . loratadine (CLARITIN) 10 MG tablet Take 10 mg by mouth daily.    . metroNIDAZOLE (METROGEL) 1 % gel Apply topically daily. (Patient taking differently: Apply topically as needed. ) 45 g 0  . naproxen sodium (ALEVE) 220 MG tablet Take 220 mg by mouth as needed. Pt takes with Nurtec as needed.    Marland Kitchen olopatadine (PATANOL) 0.1 % ophthalmic solution Place 1 drop into both eyes 2 (two) times daily.     . ondansetron (ZOFRAN ODT) 4 MG disintegrating tablet Take 1 tablet (4 mg total) by mouth every 8 (eight) hours as needed for nausea or vomiting. (Patient taking differently: Take 4 mg by mouth as needed for nausea or vomiting. ) 30 tablet 1  . Rimegepant Sulfate (NURTEC) 75 MG TBDP Take 75 mg by mouth daily as needed (take for abortive therapy of migraine, no more than 1 tablet in 24 hours or 10 per month). 10 tablet 11  . traZODone (DESYREL) 100 MG tablet Take 1-2 tablets (100-200 mg total) by mouth at bedtime as needed for sleep. 60 tablet 5  .  valACYclovir (VALTREX) 500 MG tablet Take 500 mg by mouth daily as needed (outbreak).      No current facility-administered medications for this visit.    Allergies as of 09/05/2019 - Review Complete 09/05/2019  Allergen Reaction Noted  . Codeine Shortness Of Breath, Nausea Only, and Rash 02/15/2013  . Meloxicam Other (See Comments) 05/08/2014  . Sonata [zaleplon] Other (See Comments) 10/22/2017  . Topamax [topiramate] Other (See Comments) 10/04/2018  . Aimovig [erenumab-aooe] Itching 06/26/2019  . Emgality [galcanezumab-gnlm] Rash 10/04/2018  . Penicillins Hives, Swelling, and Rash 10/09/2011    Family History  Problem Relation Age of Onset  . Healthy Mother   . Healthy Father   . Colon cancer Neg Hx   . Colon polyps Neg Hx     Social History   Socioeconomic History  . Marital status: Married    Spouse name: [name]  . Number of children: 0  . Years of education: Not on file  . Highest education level: Not on file  Occupational History  . Not on file  Tobacco Use  . Smoking status: Current Every Day Smoker    Packs/day: 1.00    Years: 20.00    Pack years: 20.00    Types: Cigarettes    Start date: 01/20/1988  . Smokeless tobacco: Never Used  Vaping Use  . Vaping Use: Never used  Substance and Sexual Activity  . Alcohol use: No    Alcohol/week: 0.0 standard drinks  . Drug use: No  . Sexual activity: Yes    Birth control/protection: None  Other Topics Concern  . 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 Difficulty of Paying Living  Expenses: Not hard at all  Food  Insecurity: No Food Insecurity  . Worried About Charity fundraiser in the Last Year: Never true  . Ran Out of Food in the Last Year: Never true  Transportation Needs: No Transportation Needs  . Lack of Transportation (Medical): No  . Lack of Transportation (Non-Medical): No  Physical Activity: Inactive  . Days of Exercise per Week: 0 days  . Minutes of Exercise per Session: 0 min  Stress: No Stress Concern Present  . Feeling of Stress : Only a little  Social Connections: Moderately Isolated  . Frequency of Communication with Friends and Family: Once a week  . Frequency of Social Gatherings with Friends and Family: Once a week  . Attends Religious Services: More than 4 times per year  . Active Member of Clubs or Organizations: No  . Attends Archivist Meetings: Never  . Marital Status: Married    Subjective: Review of Systems  Constitutional: Negative for chills, fever, malaise/fatigue and weight loss.  HENT: Negative for congestion and sore throat.   Respiratory: Negative for cough and shortness of breath.   Cardiovascular: Negative for chest pain and palpitations.  Gastrointestinal: Positive for abdominal pain, constipation and diarrhea. Negative for blood in stool, melena, nausea and vomiting.       Esophageal discomfort intermittently  Musculoskeletal: Negative for joint pain and myalgias.  Skin: Negative for rash.  Neurological: Negative for dizziness and weakness.  Endo/Heme/Allergies: Does not bruise/bleed easily.  Psychiatric/Behavioral: Negative for depression. The patient is not nervous/anxious.   All other systems reviewed and are negative.    Objective: BP 121/81   Pulse 68   Temp 97.8 F (36.6 C) (Oral)   Ht _0  (1.626 m)   Wt 158 lb 9.6 oz (71.9 kg)   LMP 10/07/2011   BMI 27.22 kg/m  Physical Exam Vitals and nursing note reviewed.  Constitutional:      General: She is not in acute distress.    Appearance:  Normal appearance. She is well-developed and normal weight. She is not ill-appearing, toxic-appearing or diaphoretic.  HENT:     Head: Normocephalic and atraumatic.     Nose: No congestion or rhinorrhea.  Eyes:     General: No scleral icterus. Cardiovascular:     Rate and Rhythm: Normal rate and regular rhythm.     Heart sounds: Normal heart sounds.  Pulmonary:     Effort: Pulmonary effort is normal. No respiratory distress.     Breath sounds: Normal breath sounds.  Abdominal:     General: Bowel sounds are normal.     Palpations: Abdomen is soft. There is no hepatomegaly, splenomegaly or mass.     Tenderness: There is no abdominal tenderness. There is no guarding or rebound.     Hernia: No hernia is present.  Skin:    General: Skin is warm and dry.     Coloration: Skin is not jaundiced.     Findings: No rash.  Neurological:     General: No focal deficit present.     Mental Status: She is alert and oriented to person, place, and time.  Psychiatric:        Attention and Perception: Attention normal.        Mood and Affect: Mood normal.        Speech: Speech normal.        Behavior: Behavior normal.        Thought Content: Thought content normal.        Cognition and Memory: Cognition and  memory normal.      Assessment:  Pleasant 54 year old female with a history of bulimia with binging/purging.  She is trying to establish with counseling, already sees psychiatry.  She is continuing to purge typically 3-4 times in evening.  Her GERD symptoms are significantly improved on Pepcid, no longer needing Carafate.  She does still have intermittent esophageal discomfort likely from her bulimia.  Previously declined colonoscopy and opted for Cologuard due to "abdominal pain if I do not eat for more than 3 to 4 hours".  However, her Cologuard came back positive.  At this point we discussed the risks and benefits, the meaning of what a positive Cologuard is and she is agreeable to proceed with  colonoscopy.  Overall, her GI symptoms are quite quiescent compared to her baseline.  I think a focus now will be on evaluating for colon abnormalities on endoscopic exam.   Plan: 1. Proceed with colonoscopy on propofol/MAC 2. Further recommendations to follow procedure 3. Follow-up in 3 months 4. Continue other medications otherwise  Proceed with TCS on propofol/MAC with Dr. Gala Romney in near future: the risks, benefits, and alternatives have been discussed with the patient in detail. The patient states understanding and desires to proceed.  ASA II  The patient is currently on Klonopin, Neurontin, trazodone, Flexeril. The patient is not on any other anticoagulants, anxiolytics, chronic pain medications, antidepressants, antidiabetics, or iron supplements.  We will plan for the procedure on propofol/MAC to promote adequate sedation.      09/05/2019 10:47 AM   Disclaimer: This note was dictated with voice recognition software. Similar sounding words can inadvertently be transcribed and may not be corrected upon review.

## 2019-09-05 NOTE — Telephone Encounter (Signed)
PA for TCS submitted via [employer] website. Case approved. [employer]# [id number], valid 10/05/19-11/04/19.

## 2019-09-05 NOTE — Patient Instructions (Addendum)
Your health issues we discussed today were:   Need for colonoscopy: 1. We will schedule your colonoscopy for you 2. Further recommendations will follow your procedure 3. Let us know if you have any abnormal symptoms or obvious bleeding  GERD (heartburn/reflux) with esophagus/chest discomfort: 1. Glad you are doing better with your GERD 2. Continue taking your Pepcid as you have been 3. You can use Carafate as needed 4. Let us know if you have any worsening or severe symptoms  Bulimia: 1. Best of luck with your new therapist! 2. Let us know if there is any way we can help  Overall I recommend:  1. Continue your other current medications 2. Return for follow-up in 3 months 3. Call us if you have any questions or concerns  ---------------------------------------------------------------  I am glad you have gotten your COVID-19 vaccination!  Even though you are fully vaccinated you should continue to follow CDC and state/local guidelines.  ---------------------------------------------------------------   At [facility] we value your feedback. You may receive a survey about your visit today. Please share your experience as we strive to create trusting relationships with our patients to provide genuine, compassionate, quality care.  We appreciate your understanding and patience as we review any laboratory studies, imaging, and other diagnostic tests that are ordered as we care for you. Our office policy is 5 business days for review of these results, and any emergent or urgent results are addressed in a timely manner for your best interest. If you do not hear from our office in 1 week, please contact us.   We also encourage the use of MyChart, which contains your medical information for your review as well. If you are not enrolled in this feature, an access code is on this after visit summary for your convenience. Thank you for allowing [place] to be involved in your care.  It  was great to see you today!  I hope you have a great rest of your summer!!

## 2019-09-05 NOTE — H&P (View-Only) (Signed)
  Referring Provider: Mills, Hannah M, NP Primary Care Physician:  Mills, Hannah M, NP Primary GI:  Dr. Rourk  Chief Complaint  Patient presents with  . Positive cologuard    4 bm's a day, consistency different in size and shape    HPI:   Jennifer [name] is a 54 y.o. female who presents for follow-up on positive Cologuard.  The patient was last seen in our office 05/03/2019 for epigastric pain, GERD, diarrhea.  Medical history significant for anxiety, binge eating/purging, OCD, bipolar disorder, interstitial cystitis.  EGD in September 2020 with mild reflux esophagitis and small hiatal hernia.  Worsening abdominal pain associated with induced vomiting.  Known history of IBS and anorectal fissures.  Previous attempted colonoscopy canceled because patient could not go 4 to 5 hours without eating because of worsening abdominal pain.  Previously felt IBS-D potentially worsened by pantoprazole so she was switched to AcipHex which she stated caused constipation so she was switched to Dexilant which she states made her "really sick" with a lot of diarrhea.  At that point she was advised to continue Pepcid twice a day with Mylanta and Carafate as needed.  CT renal stone study for right flank and lower back pain and found moderate chronic hiatal hernia.  LFTs and CBC unremarkable.  At her last visit noted no purging for 6 months and doing well.  However, 1 month ago she began purging again that has occurred 5-6 times.  Also noted 10 episodes of early morning waking with dry heaves.  Noted increased stress related to mother and spouse.  Stop Pepcid for a month.  Abdominal pain returned with purging.  Using Pepcid 20 mg twice daily and Carafate once daily for the past month.  No hematemesis.  Intermittent diarrhea over the weekend that she thought may have been contaminated food or food intolerance.  2-3 flares of genital herpes in the last month using Valtrex.  Has not tried Levsin or Bentyl for diarrhea.   Heartburn typically well managed with Pepcid.  Noted 16 to 17 pound weight loss since January 2021.  Recommended increase Pepcid to 40 mg twice daily for 3 months then reduce to 20 mg twice daily.  Use Bentyl or Levsin as needed.  Colonoscopy is scheduled.  She called to cancel her procedure, scheduled for 08/01/2019.  She noted problems with hiatal hernia and taking medicines prescribed, recent change in depression/anxiety meds have not helped.  Seeing therapist in August.  Afraid she will not be able to do prep due to abdominal pain if no eating.  Recommended proceed with Cologuard.  Unfortunately her Cologuard came back positive.  Recommended colonoscopy.  Will need office visit to schedule.  Today she states she is doing okay overall.  She notes she has not seen any rectal bleeding in the last year. GERD doing very well on Pepcid. Not needing Carafate. Not taking Levsin or bentyl as she hasn't needed it recently. Typically 4 stools a day, variable in consistency from [place] 1 to 6. Denies melena. Abdominal pain is intermittent with her bulimia. Hasn't seen a therapist, has been set up with another therapist and has first visit in September; is seeing psychiatry. Still purging about about 3-4 times a night, not as often as previously. Does have some esophageal discomfort associated with this. Denies fever, chills, unintentional weight loss. Denies URI or flu-like symptoms. Denies loss of sense of taste or smell. The patient has received COVID-19 vaccination(s). Denies chest pain, dyspnea, dizziness, lightheadedness, syncope,   near syncope. Denies any other upper or lower GI symptoms.  She is on Aleve prn.  Past Medical History:  Diagnosis Date  . Abdominal pain, epigastric 10/07/2018  . Anxiety   . Binge-eating and purging type anorexia nervosa   . Bipolar disorder (HCC)   . Cellulitis and abscess of buttock 02/15/2013  . Cystitis, interstitial   . Depression   . Esophageal polyp    about 20  years ago  . GERD (gastroesophageal reflux disease)   . Left wrist pain 03/22/2019  . OCD (obsessive compulsive disorder)   . Palpitations 05/25/2014  . Rib pain on right side 01/31/2019  . Rosacea 03/27/2019  . Sprain of temporomandibular joint or ligament 01/10/2019    Past Surgical History:  Procedure Laterality Date  . BLADDER SURGERY    . CHOLECYSTECTOMY    . ESOPHAGOGASTRODUODENOSCOPY  09/28/2016   [facility]; Dr. Karki; erosions in the esophagus, 5 cm hiatal hernia, nonbleeding erosive gastropathy s/p biopsied, normal duodenum.  Path with chronic inactive gastritis, no H. pylori or intestinal metaplasia.   . ESOPHAGOGASTRODUODENOSCOPY (EGD) WITH PROPOFOL N/A 10/17/2018   Dr. Rourk: mild reflux esophagitis, small hiatal hernia  . VOCAL CORD LATERALIZATION, ENDOSCOPIC APPROACH W/ MLB      Current Outpatient Medications  Medication Sig Dispense Refill  . acetaminophen (TYLENOL) 500 MG tablet Take 1,000 mg by mouth as needed for moderate pain or headache.     . albuterol (VENTOLIN HFA) 108 (90 Base) MCG/ACT inhaler Inhale 1-2 puffs into the lungs every 6 (six) hours as needed for wheezing or shortness of breath. 18 g 0  . Brexpiprazole ([name]) 3 MG TABS Take 1 tablet (3 mg total) by mouth in the morning. 30 tablet 2  . clonazePAM (KLONOPIN) 0.5 MG tablet Take 1 tablet (0.5 mg total) by mouth 2 (two) times daily as needed for anxiety. (Patient taking differently: Take 0.5 mg by mouth as needed for anxiety. ) 20 tablet 5  . cycloSPORINE (RESTASIS) 0.05 % ophthalmic emulsion Place 1 drop into both eyes 2 (two) times daily.     . diclofenac Sodium (VOLTAREN) 1 % GEL Apply 1 application topically as needed.     . dicyclomine (BENTYL) 20 MG tablet Take 1 tablet (20 mg total) by mouth 3 (three) times daily as needed for spasms (abdominal cramping). (Patient taking differently: Take 20 mg by mouth as needed for spasms (abdominal cramping). ) 20 tablet 0  . famotidine (PEPCID) 40 MG tablet Take 1  tablet (40 mg total) by mouth 2 (two) times daily. For 12 weeks, then go back to 20mg twice daily. 60 tablet 3  . fluticasone (FLONASE) 50 MCG/ACT nasal spray Place 1 spray into both nostrils daily as needed for allergies.     . fluvoxaMINE (LUVOX) 100 MG tablet Take 3 tablets (300 mg total) by mouth at bedtime. 270 tablet 1  . gabapentin (NEURONTIN) 600 MG tablet Take 2 tablets (1,200 mg total) by mouth 2 (two) times daily. 120 tablet 5  . hydrOXYzine (ATARAX/VISTARIL) 25 MG tablet Take 1 tablet (25 mg total) by mouth 3 (three) times daily as needed. (Patient taking differently: Take 25 mg by mouth as needed. ) 60 tablet 1  . hyoscyamine (LEVSIN) 0.125 MG tablet Take 1 tablet (0.125 mg total) by mouth every 4 (four) hours as needed (diarrhea). (Patient taking differently: Take 0.125 mg by mouth as needed (diarrhea). ) 120 tablet 1  . lidocaine-prilocaine (EMLA) cream Apply 1 application topically as needed. Apply to area 45   min prior to procedure 5 g 0  . loratadine (CLARITIN) 10 MG tablet Take 10 mg by mouth daily.    . metroNIDAZOLE (METROGEL) 1 % gel Apply topically daily. (Patient taking differently: Apply topically as needed. ) 45 g 0  . naproxen sodium (ALEVE) 220 MG tablet Take 220 mg by mouth as needed. Pt takes with Nurtec as needed.    . olopatadine (PATANOL) 0.1 % ophthalmic solution Place 1 drop into both eyes 2 (two) times daily.     . ondansetron (ZOFRAN ODT) 4 MG disintegrating tablet Take 1 tablet (4 mg total) by mouth every 8 (eight) hours as needed for nausea or vomiting. (Patient taking differently: Take 4 mg by mouth as needed for nausea or vomiting. ) 30 tablet 1  . Rimegepant Sulfate (NURTEC) 75 MG TBDP Take 75 mg by mouth daily as needed (take for abortive therapy of migraine, no more than 1 tablet in 24 hours or 10 per month). 10 tablet 11  . traZODone (DESYREL) 100 MG tablet Take 1-2 tablets (100-200 mg total) by mouth at bedtime as needed for sleep. 60 tablet 5  .  valACYclovir (VALTREX) 500 MG tablet Take 500 mg by mouth daily as needed (outbreak).      No current facility-administered medications for this visit.    Allergies as of 09/05/2019 - Review Complete 09/05/2019  Allergen Reaction Noted  . Codeine Shortness Of Breath, Nausea Only, and Rash 02/15/2013  . Meloxicam Other (See Comments) 05/08/2014  . Sonata [zaleplon] Other (See Comments) 10/22/2017  . Topamax [topiramate] Other (See Comments) 10/04/2018  . Aimovig [erenumab-aooe] Itching 06/26/2019  . Emgality [galcanezumab-gnlm] Rash 10/04/2018  . Penicillins Hives, Swelling, and Rash 10/09/2011    Family History  Problem Relation Age of Onset  . Healthy Mother   . Healthy Father   . Colon cancer Neg Hx   . Colon polyps Neg Hx     Social History   Socioeconomic History  . Marital status: Married    Spouse name: Todd  . Number of children: 0  . Years of education: Not on file  . Highest education level: Not on file  Occupational History  . Not on file  Tobacco Use  . Smoking status: Current Every Day Smoker    Packs/day: 1.00    Years: 20.00    Pack years: 20.00    Types: Cigarettes    Start date: 01/20/1988  . Smokeless tobacco: Never Used  Vaping Use  . Vaping Use: Never used  Substance and Sexual Activity  . Alcohol use: No    Alcohol/week: 0.0 standard drinks  . Drug use: No  . Sexual activity: Yes    Birth control/protection: None  Other Topics Concern  . Not on file  Social History Narrative   Lives with husband -Todd of 15 years       [place]ie- [place]      Enjoys: reading-all genres      Diet: eats all food groups   Caffeine: 1 cup daily at times, diet dr pepper daily   Water: gatorade  zero and water       Wears seat belt    Does not use phone while driving   Smoke and Carbon Detectors at home   Fire Extinguisher   Weapons  -safe area         Social Determinants of Health   Financial Resource Strain: Low Risk   . Difficulty of Paying Living  Expenses: Not hard at all  Food   Insecurity: No Food Insecurity  . Worried About Running Out of Food in the Last Year: Never true  . Ran Out of Food in the Last Year: Never true  Transportation Needs: No Transportation Needs  . Lack of Transportation (Medical): No  . Lack of Transportation (Non-Medical): No  Physical Activity: Inactive  . Days of Exercise per Week: 0 days  . Minutes of Exercise per Session: 0 min  Stress: No Stress Concern Present  . Feeling of Stress : Only a little  Social Connections: Moderately Isolated  . Frequency of Communication with Friends and Family: Once a week  . Frequency of Social Gatherings with Friends and Family: Once a week  . Attends Religious Services: More than 4 times per year  . Active Member of Clubs or Organizations: No  . Attends Club or Organization Meetings: Never  . Marital Status: Married    Subjective: Review of Systems  Constitutional: Negative for chills, fever, malaise/fatigue and weight loss.  HENT: Negative for congestion and sore throat.   Respiratory: Negative for cough and shortness of breath.   Cardiovascular: Negative for chest pain and palpitations.  Gastrointestinal: Positive for abdominal pain, constipation and diarrhea. Negative for blood in stool, melena, nausea and vomiting.       Esophageal discomfort intermittently  Musculoskeletal: Negative for joint pain and myalgias.  Skin: Negative for rash.  Neurological: Negative for dizziness and weakness.  Endo/Heme/Allergies: Does not bruise/bleed easily.  Psychiatric/Behavioral: Negative for depression. The patient is not nervous/anxious.   All other systems reviewed and are negative.    Objective: BP 121/81   Pulse 68   Temp 97.8 F (36.6 C) (Oral)   Ht 5' 4" (1.626 m)   Wt 158 lb 9.6 oz (71.9 kg)   LMP 10/07/2011   BMI 27.22 kg/m  Physical Exam Vitals and nursing note reviewed.  Constitutional:      General: She is not in acute distress.    Appearance:  Normal appearance. She is well-developed and normal weight. She is not ill-appearing, toxic-appearing or diaphoretic.  HENT:     Head: Normocephalic and atraumatic.     Nose: No congestion or rhinorrhea.  Eyes:     General: No scleral icterus. Cardiovascular:     Rate and Rhythm: Normal rate and regular rhythm.     Heart sounds: Normal heart sounds.  Pulmonary:     Effort: Pulmonary effort is normal. No respiratory distress.     Breath sounds: Normal breath sounds.  Abdominal:     General: Bowel sounds are normal.     Palpations: Abdomen is soft. There is no hepatomegaly, splenomegaly or mass.     Tenderness: There is no abdominal tenderness. There is no guarding or rebound.     Hernia: No hernia is present.  Skin:    General: Skin is warm and dry.     Coloration: Skin is not jaundiced.     Findings: No rash.  Neurological:     General: No focal deficit present.     Mental Status: She is alert and oriented to person, place, and time.  Psychiatric:        Attention and Perception: Attention normal.        Mood and Affect: Mood normal.        Speech: Speech normal.        Behavior: Behavior normal.        Thought Content: Thought content normal.        Cognition and Memory: Cognition and   memory normal.      Assessment:  Pleasant 54-year-old female with a history of bulimia with binging/purging.  She is trying to establish with counseling, already sees psychiatry.  She is continuing to purge typically 3-4 times in evening.  Her GERD symptoms are significantly improved on Pepcid, no longer needing Carafate.  She does still have intermittent esophageal discomfort likely from her bulimia.  Previously declined colonoscopy and opted for Cologuard due to "abdominal pain if I do not eat for more than 3 to 4 hours".  However, her Cologuard came back positive.  At this point we discussed the risks and benefits, the meaning of what a positive Cologuard is and she is agreeable to proceed with  colonoscopy.  Overall, her GI symptoms are quite quiescent compared to her baseline.  I think a focus now will be on evaluating for colon abnormalities on endoscopic exam.   Plan: 1. Proceed with colonoscopy on propofol/MAC 2. Further recommendations to follow procedure 3. Follow-up in 3 months 4. Continue other medications otherwise  Proceed with TCS on propofol/MAC with Dr. Rourk in near future: the risks, benefits, and alternatives have been discussed with the patient in detail. The patient states understanding and desires to proceed.  ASA II  The patient is currently on Klonopin, Neurontin, trazodone, Flexeril. The patient is not on any other anticoagulants, anxiolytics, chronic pain medications, antidepressants, antidiabetics, or iron supplements.  We will plan for the procedure on propofol/MAC to promote adequate sedation.      09/05/2019 10:47 AM   Disclaimer: This note was dictated with voice recognition software. Similar sounding words can inadvertently be transcribed and may not be corrected upon review.  

## 2019-09-06 DIAGNOSIS — M9901 Segmental and somatic dysfunction of cervical region: Secondary | ICD-10-CM | POA: Diagnosis not present

## 2019-09-06 DIAGNOSIS — M5033 Other cervical disc degeneration, cervicothoracic region: Secondary | ICD-10-CM | POA: Diagnosis not present

## 2019-09-07 ENCOUNTER — Ambulatory Visit: Payer: Self-pay | Admitting: Family Medicine

## 2019-09-07 DIAGNOSIS — Z01419 Encounter for gynecological examination (general) (routine) without abnormal findings: Secondary | ICD-10-CM | POA: Diagnosis not present

## 2019-09-07 DIAGNOSIS — A609 Anogenital herpesviral infection, unspecified: Secondary | ICD-10-CM | POA: Diagnosis not present

## 2019-09-07 DIAGNOSIS — Z1231 Encounter for screening mammogram for malignant neoplasm of breast: Secondary | ICD-10-CM | POA: Diagnosis not present

## 2019-09-14 ENCOUNTER — Encounter (HOSPITAL_BASED_OUTPATIENT_CLINIC_OR_DEPARTMENT_OTHER): Payer: Self-pay

## 2019-09-14 ENCOUNTER — Ambulatory Visit (HOSPITAL_BASED_OUTPATIENT_CLINIC_OR_DEPARTMENT_OTHER): Admit: 2019-09-14 | Payer: Medicare PPO | Admitting: Orthopedic Surgery

## 2019-09-14 SURGERY — ARTHROSCOPY, WRIST
Anesthesia: Choice | Site: Wrist | Laterality: Left

## 2019-09-15 ENCOUNTER — Encounter: Payer: Self-pay | Admitting: Family Medicine

## 2019-09-19 ENCOUNTER — Encounter (HOSPITAL_COMMUNITY): Payer: Self-pay | Admitting: Internal Medicine

## 2019-09-19 ENCOUNTER — Ambulatory Visit: Payer: Medicare PPO | Admitting: Psychology

## 2019-09-27 ENCOUNTER — Telehealth: Payer: Self-pay | Admitting: Internal Medicine

## 2019-09-27 NOTE — Telephone Encounter (Signed)
Patient was given Miralax prep by MB. That is OTC

## 2019-09-27 NOTE — Telephone Encounter (Signed)
Patient pharmacy called and said they do not have the prep that was sent in

## 2019-10-02 ENCOUNTER — Telehealth: Payer: Self-pay | Admitting: Internal Medicine

## 2019-10-02 NOTE — Telephone Encounter (Signed)
Called pt. She wanted to make sure her instructions were correct

## 2019-10-02 NOTE — Telephone Encounter (Signed)
Please call patient, she has a question about her prep.

## 2019-10-04 ENCOUNTER — Telehealth: Payer: Self-pay | Admitting: Internal Medicine

## 2019-10-04 ENCOUNTER — Other Ambulatory Visit: Payer: Self-pay

## 2019-10-04 ENCOUNTER — Other Ambulatory Visit (HOSPITAL_COMMUNITY)
Admission: RE | Admit: 2019-10-04 | Discharge: 2019-10-04 | Disposition: A | Discharge status: Home / Self Care | Payer: Medicare PPO | Source: Ambulatory Visit | Attending: Internal Medicine | Admitting: Internal Medicine

## 2019-10-04 DIAGNOSIS — Z01812 Encounter for preprocedural laboratory examination: Secondary | ICD-10-CM | POA: Diagnosis not present

## 2019-10-04 DIAGNOSIS — Z20822 Contact with and (suspected) exposure to covid-19: Secondary | ICD-10-CM | POA: Diagnosis not present

## 2019-10-04 LAB — SARS CORONAVIRUS 2 (TAT 6-24 HRS): SARS Coronavirus 2: NEGATIVE

## 2019-10-04 NOTE — Telephone Encounter (Signed)
Patient called and said that she is taking her prep but nothing much is coming out-please call

## 2019-10-04 NOTE — Telephone Encounter (Signed)
Pt is drinking her Miralax prep and she nauseated and vomiting. Please advise. [phone]

## 2019-10-04 NOTE — Telephone Encounter (Signed)
Spoke with pt. Went over instructions with pt for her prep. Pt will continue to finish her prep and call back in the morning if needed.

## 2019-10-05 ENCOUNTER — Encounter: Payer: Self-pay | Admitting: Internal Medicine

## 2019-10-05 ENCOUNTER — Ambulatory Visit (HOSPITAL_COMMUNITY)
Admission: RE | Admit: 2019-10-05 | Discharge: 2019-10-05 | Disposition: A | Discharge status: Home / Self Care | Payer: Medicare PPO | Attending: Internal Medicine | Admitting: Internal Medicine

## 2019-10-05 ENCOUNTER — Ambulatory Visit (HOSPITAL_COMMUNITY): Payer: Medicare PPO | Admitting: Anesthesiology

## 2019-10-05 ENCOUNTER — Encounter (HOSPITAL_COMMUNITY)
Admission: RE | Disposition: A | Discharge status: Home / Self Care | Payer: Self-pay | Source: Home / Self Care | Attending: Internal Medicine

## 2019-10-05 ENCOUNTER — Encounter (HOSPITAL_COMMUNITY): Payer: Self-pay | Admitting: Internal Medicine

## 2019-10-05 ENCOUNTER — Other Ambulatory Visit: Payer: Self-pay

## 2019-10-05 DIAGNOSIS — K449 Diaphragmatic hernia without obstruction or gangrene: Secondary | ICD-10-CM | POA: Diagnosis not present

## 2019-10-05 DIAGNOSIS — Z791 Long term (current) use of non-steroidal anti-inflammatories (NSAID): Secondary | ICD-10-CM | POA: Insufficient documentation

## 2019-10-05 DIAGNOSIS — Z885 Allergy status to narcotic agent status: Secondary | ICD-10-CM | POA: Insufficient documentation

## 2019-10-05 DIAGNOSIS — N200 Calculus of kidney: Secondary | ICD-10-CM | POA: Diagnosis not present

## 2019-10-05 DIAGNOSIS — K2100 Gastro-esophageal reflux disease with esophagitis, without bleeding: Secondary | ICD-10-CM | POA: Insufficient documentation

## 2019-10-05 DIAGNOSIS — Z888 Allergy status to other drugs, medicaments and biological substances status: Secondary | ICD-10-CM | POA: Insufficient documentation

## 2019-10-05 DIAGNOSIS — K580 Irritable bowel syndrome with diarrhea: Secondary | ICD-10-CM | POA: Diagnosis not present

## 2019-10-05 DIAGNOSIS — F319 Bipolar disorder, unspecified: Secondary | ICD-10-CM | POA: Diagnosis not present

## 2019-10-05 DIAGNOSIS — Z9049 Acquired absence of other specified parts of digestive tract: Secondary | ICD-10-CM | POA: Insufficient documentation

## 2019-10-05 DIAGNOSIS — R195 Other fecal abnormalities: Secondary | ICD-10-CM | POA: Insufficient documentation

## 2019-10-05 DIAGNOSIS — G478 Other sleep disorders: Secondary | ICD-10-CM | POA: Insufficient documentation

## 2019-10-05 DIAGNOSIS — Z880 Allergy status to penicillin: Secondary | ICD-10-CM | POA: Insufficient documentation

## 2019-10-05 DIAGNOSIS — Z79899 Other long term (current) drug therapy: Secondary | ICD-10-CM | POA: Insufficient documentation

## 2019-10-05 DIAGNOSIS — F419 Anxiety disorder, unspecified: Secondary | ICD-10-CM | POA: Diagnosis not present

## 2019-10-05 DIAGNOSIS — K219 Gastro-esophageal reflux disease without esophagitis: Secondary | ICD-10-CM | POA: Insufficient documentation

## 2019-10-05 DIAGNOSIS — F17210 Nicotine dependence, cigarettes, uncomplicated: Secondary | ICD-10-CM | POA: Insufficient documentation

## 2019-10-05 DIAGNOSIS — N3010 Interstitial cystitis (chronic) without hematuria: Secondary | ICD-10-CM | POA: Insufficient documentation

## 2019-10-05 DIAGNOSIS — F429 Obsessive-compulsive disorder, unspecified: Secondary | ICD-10-CM | POA: Diagnosis not present

## 2019-10-05 DIAGNOSIS — R519 Headache, unspecified: Secondary | ICD-10-CM | POA: Diagnosis not present

## 2019-10-05 DIAGNOSIS — F17200 Nicotine dependence, unspecified, uncomplicated: Secondary | ICD-10-CM | POA: Insufficient documentation

## 2019-10-05 DIAGNOSIS — K635 Polyp of colon: Secondary | ICD-10-CM

## 2019-10-05 DIAGNOSIS — F502 Bulimia nervosa: Secondary | ICD-10-CM | POA: Diagnosis not present

## 2019-10-05 HISTORY — PX: COLONOSCOPY WITH PROPOFOL: SHX5780

## 2019-10-05 HISTORY — PX: POLYPECTOMY: SHX5525

## 2019-10-05 SURGERY — COLONOSCOPY WITH PROPOFOL
Anesthesia: General

## 2019-10-05 MED ORDER — LIDOCAINE 2% (20 MG/ML) 5 ML SYRINGE
INTRAMUSCULAR | Status: AC
  Filled 2019-10-05: qty 5

## 2019-10-05 MED ORDER — GLYCOPYRROLATE PF 0.2 MG/ML IJ SOSY
PREFILLED_SYRINGE | INTRAMUSCULAR | Status: AC
  Filled 2019-10-05: qty 1

## 2019-10-05 MED ORDER — CHLORHEXIDINE GLUCONATE CLOTH 2 % EX PADS: 6.0000 | MEDICATED_PAD | Freq: Once | CUTANEOUS | Status: DC

## 2019-10-05 MED ORDER — LIDOCAINE HCL (CARDIAC) PF 100 MG/5ML IV SOSY
PREFILLED_SYRINGE | INTRAVENOUS | Status: DC | PRN
  Administered 2019-10-05: 50 mg via INTRATRACHEAL

## 2019-10-05 MED ORDER — PROPOFOL 500 MG/50ML IV EMUL
INTRAVENOUS | Status: DC | PRN
  Administered 2019-10-05: 150 ug/kg/min via INTRAVENOUS

## 2019-10-05 MED ORDER — LACTATED RINGERS IV SOLN: Freq: Once | INTRAVENOUS | Status: AC

## 2019-10-05 MED ORDER — LACTATED RINGERS IV SOLN: INTRAVENOUS | Status: DC | PRN

## 2019-10-05 MED ORDER — KETAMINE HCL 50 MG/5ML IJ SOSY
PREFILLED_SYRINGE | INTRAMUSCULAR | Status: AC
  Filled 2019-10-05: qty 5

## 2019-10-05 MED ORDER — KETAMINE HCL 10 MG/ML IJ SOLN
INTRAMUSCULAR | Status: DC | PRN
  Administered 2019-10-05: 30 mg via INTRAVENOUS

## 2019-10-05 MED ORDER — EPHEDRINE SULFATE-NACL 50-0.9 MG/10ML-% IV SOSY
PREFILLED_SYRINGE | INTRAVENOUS | Status: DC | PRN
  Administered 2019-10-05: 5 mg via INTRAVENOUS

## 2019-10-05 MED ORDER — STERILE WATER FOR IRRIGATION IR SOLN
Status: DC | PRN
  Administered 2019-10-05: 100 mL

## 2019-10-05 MED ORDER — PROPOFOL 10 MG/ML IV BOLUS
INTRAVENOUS | Status: DC | PRN
  Administered 2019-10-05: 20 mg via INTRAVENOUS

## 2019-10-05 NOTE — Anesthesia Postprocedure Evaluation (Signed)
Anesthesia Post Note  Patient: Jennifer [name]  Procedure(s) Performed: COLONOSCOPY WITH PROPOFOL (N/A ) POLYPECTOMY  Patient location during evaluation: PACU Anesthesia Type: General Level of consciousness: awake Pain management: pain level controlled Vital Signs Assessment: post-procedure vital signs reviewed and stable Respiratory status: spontaneous breathing, nonlabored ventilation and respiratory function stable Cardiovascular status: stable Postop Assessment: no apparent nausea or vomiting Anesthetic complications: no   No complications documented.   Last Vitals:  Vitals:   10/05/19 0947  BP: 124/82  Pulse: 73  Resp: 14  Temp: 37.2 C  SpO2: 98%    Last Pain:  Vitals:   10/05/19 1033  TempSrc:   PainSc: 0-No pain                 Darene Nappi Hristova

## 2019-10-05 NOTE — Discharge Instructions (Signed)
Colonoscopy Discharge Instructions  Read the instructions outlined below and refer to this sheet in the next few weeks. These discharge instructions provide you with general information on caring for yourself after you leave the hospital. Your doctor may also give you specific instructions. While your treatment has been planned according to the most current medical practices available, unavoidable complications occasionally occur. If you have any problems or questions after discharge, call Dr. Gala Romney at [phone]. ACTIVITY  You may resume your regular activity, but move at a slower pace for the next 24 hours.   Take frequent rest periods for the next 24 hours.   Walking will help get rid of the air and reduce the bloated feeling in your belly (abdomen).   No driving for 24 hours (because of the medicine (anesthesia) used during the test).    Do not sign any important legal documents or operate any machinery for 24 hours (because of the anesthesia used during the test).  NUTRITION  Drink plenty of fluids.   You may resume your normal diet as instructed by your doctor.   Begin with a light meal and progress to your normal diet. Heavy or fried foods are harder to digest and may make you feel sick to your stomach (nauseated).   Avoid alcoholic beverages for 24 hours or as instructed.  MEDICATIONS  You may resume your normal medications unless your doctor tells you otherwise.  WHAT YOU CAN EXPECT TODAY  Some feelings of bloating in the abdomen.   Passage of more gas than usual.   Spotting of blood in your stool or on the toilet paper.  IF YOU HAD POLYPS REMOVED DURING THE COLONOSCOPY:  No aspirin products for 7 days or as instructed.   No alcohol for 7 days or as instructed.   Eat a soft diet for the next 24 hours.  FINDING OUT THE RESULTS OF YOUR TEST Not all test results are available during your visit. If your test results are not back during the visit, make an appointment  with your caregiver to find out the results. Do not assume everything is normal if you have not heard from your caregiver or the medical facility. It is important for you to follow up on all of your test results.  SEEK IMMEDIATE MEDICAL ATTENTION IF:  You have more than a spotting of blood in your stool.   Your belly is swollen (abdominal distention).   You are nauseated or vomiting.   You have a temperature over 101.   You have abdominal pain or discomfort that is severe or gets worse throughout the day.    2 polyps removed from your colon today  Further recommendations to follow pending review of pathology report  At patient request, I called Jennifer [name] at [phone] got voicemail and left a message      Monitored Anesthesia Care, Care After These instructions provide you with information about caring for yourself after your procedure. Your health care provider may also give you more specific instructions. Your treatment has been planned according to current medical practices, but problems sometimes occur. Call your health care provider if you have any problems or questions after your procedure. What can I expect after the procedure? After your procedure, you may:  Feel sleepy for several hours.  Feel clumsy and have poor balance for several hours.  Feel forgetful about what happened after the procedure.  Have poor judgment for several hours.  Feel nauseous or vomit.  Have a sore throat  if you had a breathing tube during the procedure. Follow these instructions at home: For at least 24 hours after the procedure:      Have a responsible adult stay with you. It is important to have someone help care for you until you are awake and alert.  Rest as needed.  Do not: ? Participate in activities in which you could fall or become injured. ? Drive. ? Use heavy machinery. ? Drink alcohol. ? Take sleeping pills or medicines that cause drowsiness. ? Make important  decisions or sign legal documents. ? Take care of children on your own. Eating and drinking  Follow the diet that is recommended by your health care provider.  If you vomit, drink water, juice, or soup when you can drink without vomiting.  Make sure you have little or no nausea before eating solid foods. General instructions  Take over-the-counter and prescription medicines only as told by your health care provider.  If you have sleep apnea, surgery and certain medicines can increase your risk for breathing problems. Follow instructions from your health care provider about wearing your sleep device: ? Anytime you are sleeping, including during daytime naps. ? While taking prescription pain medicines, sleeping medicines, or medicines that make you drowsy.  If you smoke, do not smoke without supervision.  Keep all follow-up visits as told by your health care provider. This is important. Contact a health care provider if:  You keep feeling nauseous or you keep vomiting.  You feel light-headed.  You develop a rash.  You have a fever. Get help right away if:  You have trouble breathing. Summary  For several hours after your procedure, you may feel sleepy and have poor judgment.  Have a responsible adult stay with you for at least 24 hours or until you are awake and alert. This information is not intended to replace advice given to you by your health care provider. Make sure you discuss any questions you have with your health care provider. Document Revised: 04/05/2017 Document Reviewed: 04/28/2015 Elsevier Patient Education  [place].

## 2019-10-05 NOTE — Op Note (Signed)
[facility] Patient Name: Jennifer [name] Procedure Date: 10/05/2019 10:19 AM MRN: [id number] Date of Birth: 03-Nov-1965 Attending MD: Norvel Richards , MD CSN: [id number] Age: 54 Admit Type: Outpatient Procedure:                Colonoscopy Indications:              Positive Cologuard test Providers:                Norvel Richards, MD, Derby Page, [place]                            Risa Grill, Technician, Aram Candela Referring MD:              Medicines:                Propofol per Anesthesia Complications:            No immediate complications. Estimated Blood Loss:     Estimated blood loss: none. Procedure:                Pre-Anesthesia Assessment:                           - Prior to the procedure, a History and Physical                            was performed, and patient medications and                            allergies were reviewed. The patient's tolerance of                            previous anesthesia was also reviewed. The risks                            and benefits of the procedure and the sedation                            options and risks were discussed with the patient.                            All questions were answered, and informed consent                            was obtained. ASA Grade Assessment: II - A patient                            with mild systemic disease. After reviewing the                            risks and benefits, the patient was deemed in                            satisfactory condition to undergo the procedure.  After obtaining informed consent, the colonoscope                            was passed under direct vision. Throughout the                            procedure, the patient's blood pressure, pulse, and                            oxygen saturations were monitored continuously. The                            CF-[id number] ([id number]) scope was introduced through                             the anus and advanced to the the cecum, identified                            by appendiceal orifice and ileocecal valve. The                            colonoscopy was performed without difficulty. The                            patient tolerated the procedure well. The quality                            of the bowel preparation was adequate. Scope In: 10:39:13 AM Scope Out: 10:52:18 AM Scope Withdrawal Time: 0 hours 10 minutes 17 seconds  Total Procedure Duration: 0 hours 13 minutes 5 seconds  Findings:      The perianal and digital rectal examinations were normal.      Two sessile polyps were found in the sigmoid colon. The polyps were 2 to       4 mm in size. These polyps were removed with a cold snare. Resection and       retrieval were complete. Estimated blood loss was minimal.      The exam was otherwise without abnormality on direct and retroflexion       views. Impression:               - Two 2 to 4 mm polyps in the sigmoid colon,                            removed with a cold snare. Resected and retrieved.                           - The examination was otherwise normal on direct                            and retroflexion views. Moderate Sedation:      Moderate (conscious) sedation was personally administered by an       anesthesia professional. The following parameters were monitored: oxygen       saturation, heart rate, blood pressure, respiratory rate, EKG, adequacy  of pulmonary ventilation, and response to care. Recommendation:           - Patient has a contact number available for                            emergencies. The signs and symptoms of potential                            delayed complications were discussed with the                            patient. Return to normal activities tomorrow.                            Written discharge instructions were provided to the                            patient.                           - Advance diet as  tolerated.                           - Continue present medications.                           - Repeat colonoscopy date to be determined after                            pending pathology results are reviewed for                            surveillance.                           - Return to GI office (date not yet determined). Procedure Code(s):        --- Professional ---                           [phone], Colonoscopy, flexible; with removal of                            tumor(s), polyp(s), or other lesion(s) by snare                            technique Diagnosis Code(s):        --- Professional ---                           K63.5, Polyp of colon                           R19.5, Other fecal abnormalities CPT copyright 2019 [employer] Medical Association. All rights reserved. The codes documented in this report are preliminary and upon coder review may  be revised to meet current compliance requirements. [name]. Gala Romney, MD Norvel Richards, MD 10/05/2019 11:06:31 AM This  report has been signed electronically. Number of Addenda: 0

## 2019-10-05 NOTE — Anesthesia Preprocedure Evaluation (Addendum)
Anesthesia Evaluation  Patient identified by MRN, date of birth, ID band Patient awake    Reviewed: Allergy & Precautions, NPO status , Patient's Chart, lab work & pertinent test results  History of Anesthesia Complications Negative for: history of anesthetic complications  Airway Mallampati: II  TM Distance: >3 FB Neck ROM: Full   Comment: Vocal cord lateralization temporomandibular joint sprain Dental  (+) Teeth Intact, Dental Advisory Given   Pulmonary Current Smoker,  Vocal cord lateralization    Pulmonary exam normal breath sounds clear to auscultation       Cardiovascular Exercise Tolerance: Good Normal cardiovascular exam Rhythm:Regular Rate:Normal     Neuro/Psych  Headaches, PSYCHIATRIC DISORDERS Anxiety Depression Bipolar Disorder    GI/Hepatic Neg liver ROS, GERD  Medicated and Controlled,  Endo/Other  negative endocrine ROS  Renal/GU negative Renal ROS  negative genitourinary   Musculoskeletal temporomandibular joint sprain   Abdominal   Peds  Hematology negative hematology ROS (+)   Anesthesia Other Findings   Reproductive/Obstetrics negative OB ROS                            Anesthesia Physical Anesthesia Plan  ASA: II  Anesthesia Plan: General   Post-op Pain Management:    Induction: Intravenous  PONV Risk Score and Plan: TIVA  Airway Management Planned: Nasal Cannula and Natural Airway  Additional Equipment:   Intra-op Plan:   Post-operative Plan:   Informed Consent: I have reviewed the patients History and Physical, chart, labs and discussed the procedure including the risks, benefits and alternatives for the proposed anesthesia with the patient or authorized representative who has indicated his/her understanding and acceptance.     Dental advisory given  Plan Discussed with: CRNA and Surgeon  Anesthesia Plan Comments:         Anesthesia Quick  Evaluation

## 2019-10-05 NOTE — Interval H&P Note (Signed)
History and Physical Interval Note:  10/05/2019 10:27 AM  Jennifer [name]  has presented today for surgery, with the diagnosis of +cologuard.  The various methods of treatment have been discussed with the patient and family. After consideration of risks, benefits and other options for treatment, the patient has consented to  Procedure(s) with comments: COLONOSCOPY WITH PROPOFOL (N/A) - 12:45pm as a surgical intervention.  The patient's history has been reviewed, patient examined, no change in status, stable for surgery.  I have reviewed the patient's chart and labs.  Questions were answered to the patient's satisfaction.     Jennifer [name]  No change.  Positive Cologuard.  Diagnostic colonoscopy today per plan. The risks, benefits, limitations, alternatives and imponderables have been reviewed with the patient. Questions have been answered. All parties are agreeable.

## 2019-10-05 NOTE — Transfer of Care (Signed)
Immediate Anesthesia Transfer of Care Note  Patient: Jennifer [name]  Procedure(s) Performed: COLONOSCOPY WITH PROPOFOL (N/A ) POLYPECTOMY  Patient Location: PACU  Anesthesia Type:General  Level of Consciousness: awake, alert  and oriented  Airway & Oxygen Therapy: Patient Spontanous Breathing  Post-op Assessment: Report given to RN and Post -op Vital signs reviewed and stable  Post vital signs: Reviewed and stable  Last Vitals:  Vitals Value Taken Time  BP    Temp    Pulse    Resp    SpO2      Last Pain:  Vitals:   10/05/19 1033  TempSrc:   PainSc: 0-No pain      Patients Stated Pain Goal: 10 (74/71/59 [id number])  Complications: No complications documented.

## 2019-10-06 ENCOUNTER — Encounter: Payer: Self-pay | Admitting: Internal Medicine

## 2019-10-06 LAB — SURGICAL PATHOLOGY

## 2019-10-10 ENCOUNTER — Encounter (HOSPITAL_COMMUNITY): Payer: Self-pay | Admitting: Internal Medicine

## 2019-10-16 ENCOUNTER — Other Ambulatory Visit: Payer: Self-pay | Admitting: Neurology

## 2019-10-17 ENCOUNTER — Telehealth (INDEPENDENT_AMBULATORY_CARE_PROVIDER_SITE_OTHER): Payer: Medicare PPO | Admitting: Physician Assistant

## 2019-10-17 ENCOUNTER — Encounter: Payer: Self-pay | Admitting: Physician Assistant

## 2019-10-17 DIAGNOSIS — G4700 Insomnia, unspecified: Secondary | ICD-10-CM

## 2019-10-17 DIAGNOSIS — F429 Obsessive-compulsive disorder, unspecified: Secondary | ICD-10-CM

## 2019-10-17 DIAGNOSIS — F509 Eating disorder, unspecified: Secondary | ICD-10-CM

## 2019-10-17 DIAGNOSIS — F3130 Bipolar disorder, current episode depressed, mild or moderate severity, unspecified: Secondary | ICD-10-CM | POA: Diagnosis not present

## 2019-10-17 NOTE — Progress Notes (Signed)
[facility] Check  Patient ID: Jennifer [name],  MRN: [id number]  PCP: Perlie Mayo, NP  Date of Evaluation: 10/17/2019 Time spent:20 minutes  Chief Complaint:  Chief Complaint    Anxiety; Depression; Insomnia      Virtual Visit via Telehealth  I connected with patient by phone with their informed consent, and verified patient privacy and that I am speaking with the correct person using two identifiers.  I am private, in my office and the patient is at home.  I discussed the limitations, risks, security and privacy concerns of performing an evaluation and management service by phone and the availability of in person appointments. I also discussed with the patient that there may be a patient responsible charge related to this service. The patient expressed understanding and agreed to proceed.   I discussed the assessment and treatment plan with the patient. The patient was provided an opportunity to ask questions and all were answered. The patient agreed with the plan and demonstrated an understanding of the instructions.   The patient was advised to call back or seek an in-person evaluation if the symptoms worsen or if the condition fails to improve as anticipated.  I provided 20 minutes of non-face-to-face time during this encounter.  HISTORY/CURRENT STATUS: HPI For routine med check.  States she's doing really well.  The [name] is helping a lot. She is able to enjoy things. Still likes to sew.  Energy and motivation are good.  She is not isolating. Not crying as easily. Not binging but still does purge several times a week. Memory is still the same, not as good as it used to be, but no change, and she's ok with it. No suicidal or homicidal thoughts.  Patient denies increased energy with decreased need for sleep, no increased talkativeness, no racing thoughts, no impulsivity or risky behaviors, no increased spending, no increased libido, no grandiosity,  No hallucinations  or paranoia.  Denies dizziness, syncope, seizures, numbness, tingling, tremor, tics, unsteady gait, slurred speech, confusion. Denies muscle or joint pain, stiffness, or dystonia.Denies unexplained weight loss, frequent infections, or sores that heal slowly.  No polyphagia, polydipsia, or polyuria. Denies visual changes or paresthesias.   Individual Medical History/ Review of Systems: Changes? :Yes  Had 2 polyps removed when she had colonoscopy, benign  Past medications for mental health diagnoses include: Risperdal, Seroquel, Prozac, Zoloft, Wellbutrin, Lamictal, Depakote caused hair loss,Xanax, Ambien, trazodone, Trileptal, Luvox, Topamax, Elavil, Pamelor, BuSpar, doxazosin, prazosin, lithium, Vraylar, Abilify, [name], Latuda >60 mg caused abd pain and nausea  Allergies: Codeine, Penicillins, Meloxicam, Sonata [zaleplon], Topamax [topiramate], Aimovig [erenumab-aooe], and Emgality [galcanezumab-gnlm]  Current Medications:  Current Outpatient Medications:  .  acetaminophen (TYLENOL) 500 MG tablet, Take 1,000 mg by mouth daily as needed for fever. , Disp: , Rfl:  .  Brexpiprazole ([name]) 3 MG TABS, Take 1 tablet (3 mg total) by mouth in the morning., Disp: 30 tablet, Rfl: 2 .  clonazePAM (KLONOPIN) 0.5 MG tablet, Take 1 tablet (0.5 mg total) by mouth 2 (two) times daily as needed for anxiety. (Patient taking differently: Take 0.25-0.5 mg by mouth 2 (two) times daily as needed for anxiety. ), Disp: 20 tablet, Rfl: 5 .  cycloSPORINE (RESTASIS) 0.05 % ophthalmic emulsion, Place 1 drop into both eyes 2 (two) times daily. , Disp: , Rfl:  .  diclofenac Sodium (VOLTAREN) 1 % GEL, Apply 1 application topically daily as needed (Wrist pain). , Disp: , Rfl:  .  dicyclomine (BENTYL) 20 MG tablet, Take  1 tablet (20 mg total) by mouth 3 (three) times daily as needed for spasms (abdominal cramping). (Patient taking differently: Take 20 mg by mouth 2 (two) times daily as needed (abdominal cramping). ), Disp:  20 tablet, Rfl: 0 .  famotidine (PEPCID) 40 MG tablet, Take 1 tablet (40 mg total) by mouth 2 (two) times daily. For 12 weeks, then go back to 20mg  twice daily. (Patient taking differently: Take 20 mg by mouth 2 (two) times daily. ), Disp: 60 tablet, Rfl: 3 .  fluticasone (FLONASE) 50 MCG/ACT nasal spray, Place 1 spray into both nostrils daily as needed for allergies. , Disp: , Rfl:  .  fluvoxaMINE (LUVOX) 100 MG tablet, Take 3 tablets (300 mg total) by mouth at bedtime. (Patient taking differently: Take 300 mg by mouth daily in the afternoon. ), Disp: 270 tablet, Rfl: 1 .  gabapentin (NEURONTIN) 600 MG tablet, Take 2 tablets (1,200 mg total) by mouth 2 (two) times daily., Disp: 120 tablet, Rfl: 5 .  hydrOXYzine (ATARAX/VISTARIL) 25 MG tablet, Take 1 tablet (25 mg total) by mouth 3 (three) times daily as needed. (Patient taking differently: Take 25 mg by mouth daily as needed [employer]). ), Disp: 60 tablet, Rfl: 1 .  hyoscyamine (LEVSIN) 0.125 MG tablet, Take 1 tablet (0.125 mg total) by mouth every 4 (four) hours as needed (diarrhea). (Patient taking differently: Take 0.125 mg by mouth daily as needed (upset Stomach). ), Disp: 120 tablet, Rfl: 1 .  lidocaine-prilocaine (EMLA) cream, Apply 1 application topically as needed. Apply to area 45 min prior to procedure (Patient taking differently: Apply 1 application topically as needed (Wrist pain). Apply to area 45 min prior to procedure), Disp: 5 g, Rfl: 0 .  loratadine (CLARITIN) 10 MG tablet, Take 5 mg by mouth daily as needed for allergies. , Disp: , Rfl:  .  metroNIDAZOLE (METROGEL) 1 % gel, Apply topically daily. (Patient taking differently: Apply 1 application topically daily as needed (Breakout on face). ), Disp: 45 g, Rfl: 0 .  naproxen sodium (ALEVE) 220 MG tablet, Take 440-660 mg by mouth daily as needed (Pt takes with Nurtec as needed.). , Disp: , Rfl:  .  olopatadine (PATANOL) 0.1 % ophthalmic solution, Place 1 drop into both eyes daily as  needed for allergies. , Disp: , Rfl:  .  ondansetron (ZOFRAN-ODT) 4 MG disintegrating tablet, TAKE ONE TABLET (4MG  TOTAL) BY MOUTH EVERY 8 HOURS AS NEEDED FOR NAUSEA OR VOMITING, Disp: 30 tablet, Rfl: 1 .  Rimegepant Sulfate (NURTEC) 75 MG TBDP, Take 75 mg by mouth daily as needed (take for abortive therapy of migraine, no more than 1 tablet in 24 hours or 10 per month)., Disp: 10 tablet, Rfl: 11 .  traZODone (DESYREL) 100 MG tablet, Take 1-2 tablets (100-200 mg total) by mouth at bedtime as needed for sleep. (Patient taking differently: Take 100 mg by mouth at bedtime as needed for sleep. ), Disp: 60 tablet, Rfl: 5 .  valACYclovir (VALTREX) 500 MG tablet, Take 500 mg by mouth daily as needed (Flair up take Twice a day  for 3 days and stop). , Disp: , Rfl:  Medication Side Effects: none  Family Medical/ Social History: Changes? No  MENTAL HEALTH EXAM:  Last menstrual period 10/07/2011.There is no height or weight on file to calculate BMI.  General Appearance: unable to assess  Eye Contact:  unable to assess  Speech:  Clear and Coherent and Normal Rate  Volume:  Normal  Mood:  Euthymic  Affect:  unable to assess  Thought Process:  Goal Directed and Descriptions of Associations: Intact  Orientation:  Full (Time, Place, and Person)  Thought Content: Logical   Suicidal Thoughts:  No  Homicidal Thoughts:  No  Memory:  Some decrease in immediate and recent memory but not worse.  Judgement:  Good  Insight:  Good  Psychomotor Activity:  unable to assess   Concentration:  Concentration: Good  Recall:  Good  Fund of Knowledge: Good  Language: Good  Assets:  Desire for Improvement  ADL's:  Intact  Cognition: WNL  Prognosis:  Good    DIAGNOSES:    ICD-10-CM   1. Bipolar I disorder, most recent episode depressed ([place])  F31.30   2. Obsessive-compulsive disorder, unspecified type  F42.9   3. Eating disorder, unspecified type  F50.9   4. Insomnia, unspecified type  G47.00     Receiving  Psychotherapy: No    Will start tomorrow with [facility]                 RECOMMENDATIONS:  PDMP reviewed.  I provided 20 minutes of nonface-to-face care during this encounter.  I'm glad to hear she's doing so much better!  Continue [name] 3 mg, 1 p.o. daily. Continue Klonopin 0.5 mg, 1 p.o. twice daily as needed.  She takes pretty rarely. Continue Luvox 100 mg, 3 p.o. nightly. Continue gabapentin 600 mg, 2 p.o. twice daily. Decrease hydroxyzine to 25 mg, 1/2-1 every 8 hours as needed anxiety. She rarely takes.  Continue trazodone 100 mg, 1.5 pills nightly.  Starting therapy tomorrow with someone new.  Return in [place].   [name], PA-C

## 2019-10-18 DIAGNOSIS — F319 Bipolar disorder, unspecified: Secondary | ICD-10-CM | POA: Diagnosis not present

## 2019-10-18 DIAGNOSIS — F429 Obsessive-compulsive disorder, unspecified: Secondary | ICD-10-CM | POA: Diagnosis not present

## 2019-10-18 DIAGNOSIS — F419 Anxiety disorder, unspecified: Secondary | ICD-10-CM | POA: Diagnosis not present

## 2019-10-18 DIAGNOSIS — F509 Eating disorder, unspecified: Secondary | ICD-10-CM | POA: Diagnosis not present

## 2019-10-25 DIAGNOSIS — F509 Eating disorder, unspecified: Secondary | ICD-10-CM | POA: Diagnosis not present

## 2019-10-25 DIAGNOSIS — F419 Anxiety disorder, unspecified: Secondary | ICD-10-CM | POA: Diagnosis not present

## 2019-10-25 DIAGNOSIS — F429 Obsessive-compulsive disorder, unspecified: Secondary | ICD-10-CM | POA: Diagnosis not present

## 2019-10-25 DIAGNOSIS — F319 Bipolar disorder, unspecified: Secondary | ICD-10-CM | POA: Diagnosis not present

## 2019-11-07 ENCOUNTER — Telehealth (INDEPENDENT_AMBULATORY_CARE_PROVIDER_SITE_OTHER): Payer: Medicare PPO | Admitting: Family Medicine

## 2019-11-07 ENCOUNTER — Other Ambulatory Visit: Payer: Self-pay

## 2019-11-07 ENCOUNTER — Encounter: Payer: Self-pay | Admitting: Family Medicine

## 2019-11-07 VITALS — BP 96/63 | Wt 150.0 lb

## 2019-11-07 DIAGNOSIS — J0100 Acute maxillary sinusitis, unspecified: Secondary | ICD-10-CM

## 2019-11-07 DIAGNOSIS — R059 Cough, unspecified: Secondary | ICD-10-CM | POA: Insufficient documentation

## 2019-11-07 HISTORY — DX: Acute maxillary sinusitis, unspecified: J01.00

## 2019-11-07 MED ORDER — AZITHROMYCIN 250 MG PO TABS: ORAL_TABLET | ORAL | 0 refills | Status: DC

## 2019-11-07 MED ORDER — PROMETHAZINE-DM 6.25-15 MG/5ML PO SYRP: 2.5000 mL | ORAL_SOLUTION | Freq: Four times a day (QID) | ORAL | 0 refills | Status: DC | PRN

## 2019-11-07 NOTE — Assessment & Plan Note (Signed)
Sinus Infx, treatment provided. Education provided on symptom management as well as safe measures.  Reviewed side effects, risks and benefits of medication.   Patient acknowledged agreement and understanding of the plan.

## 2019-11-07 NOTE — Patient Instructions (Signed)
  HAPPY FALL!  I appreciate the opportunity to provide you with care for your health and wellness. Today we discussed: sinus infection  Follow up: as scheduled  No labs or referrals today  Today you were seen for URI/Sinus Infection.  Please take the prescribed medication as directed and complete the full dose to prevent reinfection.  Please eat yogurt or take a probiotic if possible to avoid a yeast infection (this is common) from antibiotic. Should you develop a infection call the office or send a message in [place] to let us know. We will address it quickly.  As suggested for symptom management use Nasal saline spray to help ease congestion and dry nasal passages. Can be used throughout the day as needed. Avoid forceful blowing of nose. It will be common to have some blood if blowing nose a lot or if nose is very dry. Can you a humidifier at home as well. Remember to wash and air it out regularly. Tylenol (325 mg 2 tablets every 6 hours) and or Ibuprofen (200- 400 mg every 8 hours) for fever, sore throat and body aches. Can consider use of a Neti-pot to wash out sinus cavity (twice daily). Please be aware that you need to use distilled or boiled water for these. If congestion is increasing and or coughing can use Mucinex (twice daily) for cough and congestion with full glass of water.  If you have a sore throat warm fluids like hot tea or lemon water can help sooth this.    Please hydrate, rest, get fresh air daily and wash your hands well.  I hope you feel better soon.  Call in 3-5 days if symptoms persist/worsen and you have completed your full course of medication.  Please continue to practice social distancing to keep you, your family, and our community safe.  If you must go out, please wear a mask and practice good handwashing.  It was a pleasure to see you and I look forward to continuing to work together on your health and well-being. Please do not hesitate to call the  office if you need care or have questions about your care.  Have a wonderful day and week. With Gratitude, Cherly Beach, DNP, AGNP-BC

## 2019-11-07 NOTE — Assessment & Plan Note (Signed)
Cough syrup provided-  Reviewed side effects, risks and benefits of medication.

## 2019-11-07 NOTE — Progress Notes (Signed)
Virtual Visit via Video Note   This visit type was conducted due to national recommendations for restrictions regarding the COVID-19 Pandemic (e.g. social distancing) in an effort to limit this patient's exposure and mitigate transmission in our community.  Due to her co-morbid illnesses, this patient is at least at moderate risk for complications without adequate follow up.  This format is felt to be most appropriate for this patient at this time.  All issues noted in this document were discussed and addressed.  A limited physical exam was performed with this format.       Evaluation Performed:  Follow-up visit  Date:  11/07/2019   ID:  Jennifer [name], DOB 12/24/1965, MRN [id number]  Patient Location: Home Provider Location: Office/Clinic  Location of Patient: Home Location of Provider: Telehealth Consent was obtain for visit to be over via telehealth. I verified that I am speaking with the correct person using two identifiers.  PCP:  Perlie Mayo, NP   Chief Complaint: Ongoing sinus congestion  History of Present Illness:    Jennifer [name] is a 54 y.o. female with 2 plus week history of ongoing sinus congestion, drainage facial pain and headache.  Reports that she has been taking Mucinex DM for 2 weeks off and on in addition to Zyrtec and Aleve to help with headache and sinus congestion.  This is not relieved much.  She does have some facial pain under her eyes as well.  Is worse on the left.  Drainage from her nose is greenish-yellow, when she coughs up mucus it is yellow only.  Bilateral ear pain denies muscle aches pains and sore throat.  Denies having any shortness of breath or chest pain.  Denies fevers or chills.  The patient does not have symptoms concerning for COVID-19 infection (fever, chills, cough, or new shortness of breath).   Past Medical, Surgical, Social History, Allergies, and Medications have been Reviewed.  Past Medical History:  Diagnosis Date  .  Abdominal pain, epigastric 10/07/2018  . Anxiety   . Binge-eating and purging type anorexia nervosa   . Bipolar disorder ([place])   . Cellulitis and abscess of buttock 02/15/2013  . Cystitis, interstitial   . Depression   . Esophageal polyp    about 20 years ago  . GERD (gastroesophageal reflux disease)   . Left wrist pain 03/22/2019  . OCD (obsessive compulsive disorder)   . Palpitations 05/25/2014  . Rib pain on right side 01/31/2019  . Rosacea 03/27/2019  . Sprain of temporomandibular joint or ligament 01/10/2019   Past Surgical History:  Procedure Laterality Date  . BLADDER SURGERY    . CHOLECYSTECTOMY    . COLONOSCOPY WITH PROPOFOL N/A 10/05/2019   Procedure: COLONOSCOPY WITH PROPOFOL;  Surgeon: Daneil Dolin, MD;  [facility];  Service: Endoscopy;  Laterality: N/A;  12:45pm  . ESOPHAGOGASTRODUODENOSCOPY  09/28/2016   [facility]; Dr. Therisa Doyne; erosions in the esophagus, 5 cm hiatal hernia, nonbleeding erosive gastropathy s/p biopsied, normal duodenum.  Path with chronic inactive gastritis, no H. pylori or intestinal metaplasia.   Marland Kitchen ESOPHAGOGASTRODUODENOSCOPY (EGD) WITH PROPOFOL N/A 10/17/2018   Dr. Gala Romney: mild reflux esophagitis, small hiatal hernia  . POLYPECTOMY  10/05/2019   Procedure: POLYPECTOMY;  Surgeon: Daneil Dolin, MD;  Location: [facility];  Service: Endoscopy;;  . VOCAL CORD LATERALIZATION, ENDOSCOPIC APPROACH W/ MLB       Current Meds  Medication Sig  . acetaminophen (TYLENOL) 500 MG tablet Take 1,000  mg by mouth daily as needed for fever.   . Brexpiprazole ([name]) 3 MG TABS Take 1 tablet (3 mg total) by mouth in the morning.  . clonazePAM (KLONOPIN) 0.5 MG tablet Take 1 tablet (0.5 mg total) by mouth 2 (two) times daily as needed for anxiety. (Patient taking differently: Take 0.25-0.5 mg by mouth 2 (two) times daily as needed for anxiety. )  . cycloSPORINE (RESTASIS) 0.05 % ophthalmic emulsion Place 1 drop into both eyes 2 (two) times daily.   . diclofenac  Sodium (VOLTAREN) 1 % GEL Apply 1 application topically daily as needed (Wrist pain).   Marland Kitchen dicyclomine (BENTYL) 20 MG tablet Take 1 tablet (20 mg total) by mouth 3 (three) times daily as needed for spasms (abdominal cramping). (Patient taking differently: Take 20 mg by mouth 2 (two) times daily as needed (abdominal cramping). )  . famotidine (PEPCID) 40 MG tablet Take 1 tablet (40 mg total) by mouth 2 (two) times daily. For 12 weeks, then go back to 20mg  twice daily. (Patient taking differently: Take 20 mg by mouth 2 (two) times daily. )  . fluticasone (FLONASE) 50 MCG/ACT nasal spray Place 1 spray into both nostrils daily as needed for allergies.   . fluvoxaMINE (LUVOX) 100 MG tablet Take 3 tablets (300 mg total) by mouth at bedtime. (Patient taking differently: Take 300 mg by mouth daily in the afternoon. )  . gabapentin (NEURONTIN) 600 MG tablet Take 2 tablets (1,200 mg total) by mouth 2 (two) times daily.  . hydrOXYzine (ATARAX/VISTARIL) 25 MG tablet Take 1 tablet (25 mg total) by mouth 3 (three) times daily as needed. (Patient taking differently: Take 25 mg by mouth daily as needed [employer]). )  . hyoscyamine (LEVSIN) 0.125 MG tablet Take 1 tablet (0.125 mg total) by mouth every 4 (four) hours as needed (diarrhea). (Patient taking differently: Take 0.125 mg by mouth daily as needed (upset Stomach). )  . lidocaine-prilocaine (EMLA) cream Apply 1 application topically as needed. Apply to area 45 min prior to procedure (Patient taking differently: Apply 1 application topically as needed (Wrist pain). Apply to area 45 min prior to procedure)  . loratadine (CLARITIN) 10 MG tablet Take 5 mg by mouth daily as needed for allergies.   . metroNIDAZOLE (METROGEL) 1 % gel Apply topically daily. (Patient taking differently: Apply 1 application topically daily as needed (Breakout on face). )  . naproxen sodium (ALEVE) 220 MG tablet Take 440-660 mg by mouth daily as needed (Pt takes with Nurtec as needed.).    Marland Kitchen olopatadine (PATANOL) 0.1 % ophthalmic solution Place 1 drop into both eyes daily as needed for allergies.   Marland Kitchen ondansetron (ZOFRAN-ODT) 4 MG disintegrating tablet TAKE ONE TABLET (4MG  TOTAL) BY MOUTH EVERY 8 HOURS AS NEEDED FOR NAUSEA OR VOMITING  . Rimegepant Sulfate (NURTEC) 75 MG TBDP Take 75 mg by mouth daily as needed (take for abortive therapy of migraine, no more than 1 tablet in 24 hours or 10 per month).  . traZODone (DESYREL) 100 MG tablet Take 1-2 tablets (100-200 mg total) by mouth at bedtime as needed for sleep. (Patient taking differently: Take 100 mg by mouth at bedtime as needed for sleep. )  . valACYclovir (VALTREX) 500 MG tablet Take 500 mg by mouth daily as needed (Flair up take Twice a day  for 3 days and stop).      Allergies:   Codeine, Penicillins, Meloxicam, Sonata [zaleplon], Topamax [topiramate], Aimovig [erenumab-aooe], and Emgality [galcanezumab-gnlm]   ROS:   Please  see the history of present illness.    All other systems reviewed and are negative.   Labs/Other Tests and Data Reviewed:    Recent Labs: 03/20/2019: ALT 22; BUN 16; Creatinine, Ser 0.83; Hemoglobin 13.4; Platelets 187; Potassium 3.4; Sodium 135   Recent Lipid Panel Lab Results  Component Value Date/Time   CHOL 170 12/06/2015 06:09 AM   TRIG 95 12/06/2015 06:09 AM   HDL 61 12/06/2015 06:09 AM   CHOLHDL 2.8 12/06/2015 06:09 AM   LDLCALC 90 12/06/2015 06:09 AM    Wt Readings from Last 3 Encounters:  11/07/19 150 lb (68 kg)  10/05/19 150 lb (68 kg)  09/05/19 158 lb 9.6 oz (71.9 kg)     Objective:    Vital Signs:  BP 96/63   Wt 150 lb (68 kg)   LMP 10/07/2011   BMI 25.75 kg/m    VITAL SIGNS:  reviewed GEN:  no acute distress EYES:  sclerae anicteric, EOMI - Extraocular Movements Intact RESPIRATORY:  normal respiratory effort, symmetric expansion SKIN:  no rash, lesions or ulcers. MUSCULOSKELETAL:  no obvious deformities. NEURO:  alert and oriented x 3, no obvious focal  deficit PSYCH:  normal affect  ASSESSMENT & PLAN:    1. Acute non-recurrent maxillary sinusitis  - azithromycin (ZITHROMAX) 250 MG tablet; Take 2 tablets (500 mg) on the first day. Then take 1 tablet (250mg ) on days 2-5.  Dispense: 6 tablet; Refill: 0  2. Cough  - promethazine-dextromethorphan (PROMETHAZINE-DM) 6.25-15 MG/5ML syrup; Take 2.5 mLs by mouth 4 (four) times daily as needed for cough.  Dispense: 118 mL; Refill: 0   Time:   Today, I have spent 5 minutes with the patient with telehealth technology discussing the above problems.     Medication Adjustments/Labs and Tests Ordered: Current medicines are reviewed at length with the patient today.  Concerns regarding medicines are outlined above.   Tests Ordered: No orders of the defined types were placed in this encounter.   Medication Changes: No orders of the defined types were placed in this encounter.    Note: This dictation was prepared with Dragon dictation along with smaller phrase technology. Similar sounding words can be transcribed inadequately or may not be corrected upon review. Any transcriptional errors that result from this process are unintentional.      Disposition:  Follow up as scheduled Signed, Perlie Mayo, NP  11/07/2019 9:38 AM     [facility]

## 2019-11-09 ENCOUNTER — Ambulatory Visit: Payer: Medicare PPO | Admitting: Family Medicine

## 2019-11-14 ENCOUNTER — Ambulatory Visit (INDEPENDENT_AMBULATORY_CARE_PROVIDER_SITE_OTHER): Payer: Self-pay | Admitting: Plastic Surgery

## 2019-11-14 ENCOUNTER — Encounter: Payer: Self-pay | Admitting: Plastic Surgery

## 2019-11-14 ENCOUNTER — Other Ambulatory Visit: Payer: Self-pay

## 2019-11-14 DIAGNOSIS — Z719 Counseling, unspecified: Secondary | ICD-10-CM | POA: Insufficient documentation

## 2019-11-14 NOTE — Progress Notes (Signed)
Preoperative Dx: Hyperpigmentation of nose  Postoperative Dx:  same  Procedure: laser to nose  Anesthesia: none  Description of Procedure:  Risks and complications were explained to the patient. Consent was confirmed and signed. Time out was called and all information was confirmed to be correct. The area  area was prepped with alcohol and wiped dry. The IPL laser was set at 7.4 J/cm2. The nose was lasered. The patient tolerated the procedure well and there were no complications. The patient is to follow up in 4 weeks.

## 2019-11-18 ENCOUNTER — Encounter: Payer: Self-pay | Admitting: Family Medicine

## 2019-11-21 ENCOUNTER — Telehealth: Payer: Self-pay

## 2019-11-21 ENCOUNTER — Other Ambulatory Visit: Payer: Self-pay | Admitting: Family Medicine

## 2019-11-21 ENCOUNTER — Telehealth: Payer: Medicare PPO | Admitting: Family Medicine

## 2019-11-21 DIAGNOSIS — R059 Cough, unspecified: Secondary | ICD-10-CM

## 2019-11-21 MED ORDER — PROMETHAZINE-DM 6.25-15 MG/5ML PO SYRP: 2.5000 mL | ORAL_SOLUTION | Freq: Four times a day (QID) | ORAL | 0 refills | Status: DC | PRN

## 2019-11-21 NOTE — Telephone Encounter (Signed)
It has been send, they might not of had it yet. Should be ready within the hour.  Thanks

## 2019-11-21 NOTE — Telephone Encounter (Signed)
She can cancel appt and call back if not better.

## 2019-11-21 NOTE — Telephone Encounter (Signed)
Pt called stating the pharmacy has not received the Rx for her cough syrup. Can you send this in please.

## 2019-11-21 NOTE — Telephone Encounter (Signed)
Pt informed. Appt cancelled.

## 2019-11-21 NOTE — Telephone Encounter (Signed)
Pt says the cough syrup did help. Can you send this to [facility]? She will increase fluids and take Mucinex. Does she still need to do phone visit with you? She said she would rather not if you didn't need to speak with her, she will just try what you mentioned. Please advise.

## 2019-11-21 NOTE — Telephone Encounter (Signed)
Pt informed

## 2019-11-21 NOTE — Telephone Encounter (Signed)
Please call and ask if she is taking Mucinex. If she is not, I suggest taking that daily and increasing fluid to 64 oz to thin out the mucus. I can refill the cough syrup for night time as well, if she feels it was helpful.

## 2019-11-22 ENCOUNTER — Telehealth: Payer: Self-pay | Admitting: Physician Assistant

## 2019-11-22 NOTE — Telephone Encounter (Signed)
Please review

## 2019-11-22 NOTE — Telephone Encounter (Signed)
Pt called and said that she has not being able to sleep for two weeks. She would like to start something to help with this. She took trazadone in the past and said it worked but it was hard for her to wake up.. Please call her after 11 at [phone]

## 2019-11-22 NOTE — Telephone Encounter (Signed)
Let's add Mirtazepine 7.5 mg, 1-2 qhs prn sleep. I'll send it in.

## 2019-11-23 ENCOUNTER — Other Ambulatory Visit: Payer: Self-pay

## 2019-11-23 ENCOUNTER — Encounter: Payer: Self-pay | Admitting: Family Medicine

## 2019-11-23 MED ORDER — MIRTAZAPINE 7.5 MG PO TABS: ORAL_TABLET | ORAL | 0 refills | Status: DC

## 2019-11-23 NOTE — Telephone Encounter (Signed)
Left message with information and to call back with her pharmacy.

## 2019-11-23 NOTE — Telephone Encounter (Signed)
Jennifer [name] called back about her sleep issues.  She hadn't listened to the earlier message.  I told her Helene Kelp was going to add mirtazapine but needed to verify the pharmacy.  She does use the [facility].  Please send it in.

## 2019-11-23 NOTE — Telephone Encounter (Signed)
Rx sent to [facility]

## 2019-11-28 ENCOUNTER — Telehealth (INDEPENDENT_AMBULATORY_CARE_PROVIDER_SITE_OTHER): Payer: Medicare PPO | Admitting: Physician Assistant

## 2019-11-28 ENCOUNTER — Encounter: Payer: Self-pay | Admitting: Physician Assistant

## 2019-11-28 DIAGNOSIS — F509 Eating disorder, unspecified: Secondary | ICD-10-CM

## 2019-11-28 DIAGNOSIS — F411 Generalized anxiety disorder: Secondary | ICD-10-CM | POA: Diagnosis not present

## 2019-11-28 DIAGNOSIS — Z638 Other specified problems related to primary support group: Secondary | ICD-10-CM | POA: Diagnosis not present

## 2019-11-28 DIAGNOSIS — F3130 Bipolar disorder, current episode depressed, mild or moderate severity, unspecified: Secondary | ICD-10-CM | POA: Diagnosis not present

## 2019-11-28 MED ORDER — GABAPENTIN 600 MG PO TABS: 1200.0000 mg | ORAL_TABLET | Freq: Two times a day (BID) | ORAL | 5 refills | Status: DC

## 2019-11-28 MED ORDER — CLONAZEPAM 0.5 MG PO TABS: 0.5000 mg | ORAL_TABLET | Freq: Two times a day (BID) | ORAL | 5 refills | Status: DC | PRN

## 2019-11-28 MED ORDER — FLUVOXAMINE MALEATE 100 MG PO TABS
300.0000 mg | ORAL_TABLET | Freq: Every day | ORAL | 1 refills | Status: DC
Start: 2019-11-28 — End: 2020-05-21

## 2019-11-28 MED ORDER — REXULTI 3 MG PO TABS: 3.0000 mg | ORAL_TABLET | Freq: Every morning | ORAL | 5 refills | Status: DC

## 2019-11-28 NOTE — Progress Notes (Signed)
[facility] Med Check  Patient ID: Jennifer [name],  MRN: [id number]  PCP: Perlie Mayo, NP  Date of Evaluation: 11/28/2019 Time spent:20 minutes  Chief Complaint:  Chief Complaint    Depression; Anxiety; Insomnia      Virtual Visit via Telehealth  I connected with patient by phone with their informed consent, and verified patient privacy and that I am speaking with the correct person using two identifiers.  I am private, in my office and the patient is at home.  I discussed the limitations, risks, security and privacy concerns of performing an evaluation and management service by video and the availability of in person appointments. I also discussed with the patient that there may be a patient responsible charge related to this service. The patient expressed understanding and agreed to proceed.   I discussed the assessment and treatment plan with the patient. The patient was provided an opportunity to ask questions and all were answered. The patient agreed with the plan and demonstrated an understanding of the instructions.   The patient was advised to call back or seek an in-person evaluation if the symptoms worsen or if the condition fails to improve as anticipated.  I provided 20 minutes of non-face-to-face time during this encounter.  HISTORY/CURRENT STATUS: HPI For routine med check.  Had a really bad day last week. Her Mom had been mean to her. But after crying a lot and taking the Klonopin, she felt better. "I've just got to let things go. I know she's not going to change and I have to accept it." The day she was upset, she took an extra Rexulit 1 mg. Felt like it helped but not sure if it was that or the Klonopin. Has been sleeping good, but wakes up very groggy. She's been taking the Trazodone and mirtazapine together.  Patient denies loss of interest in usual activities and is able to enjoy things.  Denies decreased energy or motivation.  Appetite has not changed.  No  extreme sadness, tearfulness, or feelings of hopelessness.  Denies any changes in concentration, making decisions or remembering things. Usually not having anxiety, is able to deal with it better than this last episode. Eating disorder is fine. Hasn't purged much lately.  Denies suicidal or homicidal thoughts.  Patient denies increased energy with decreased need for sleep, no increased talkativeness, no racing thoughts, no impulsivity or risky behaviors, no increased spending, no increased libido, no grandiosity,  No hallucinations or paranoia.  Denies dizziness, syncope, seizures, numbness, tingling, tremor, tics, unsteady gait, slurred speech, confusion. Denies muscle or joint pain, stiffness, or dystonia.Denies unexplained weight loss, frequent infections, or sores that heal slowly.  No polyphagia, polydipsia, or polyuria. Denies visual changes or paresthesias.   Individual Medical History/ Review of Systems: Changes? :Yes  Had 2 polyps removed when she had colonoscopy, benign  Past medications for mental health diagnoses include: Risperdal, Seroquel, Prozac, Zoloft, Wellbutrin, Lamictal, Depakote caused hair loss,Xanax, Ambien, trazodone, Trileptal, Luvox, Topamax, Elavil, Pamelor, BuSpar, doxazosin, prazosin, lithium, Vraylar, Abilify, [name], Latuda >60 mg caused abd pain and nausea  Allergies: Codeine, Penicillins, Meloxicam, Sonata [zaleplon], Topamax [topiramate], Aimovig [erenumab-aooe], and Emgality [galcanezumab-gnlm]  Current Medications:  Current Outpatient Medications:    acetaminophen (TYLENOL) 500 MG tablet, Take 1,000 mg by mouth daily as needed for fever. , Disp: , Rfl:    Brexpiprazole ([name]) 3 MG TABS, Take 1 tablet (3 mg total) by mouth in the morning., Disp: 30 tablet, Rfl: 5   clonazePAM (KLONOPIN) 0.5 MG tablet,  Take 1 tablet (0.5 mg total) by mouth 2 (two) times daily as needed for anxiety., Disp: 20 tablet, Rfl: 5   cycloSPORINE (RESTASIS) 0.05 % ophthalmic  emulsion, Place 1 drop into both eyes 2 (two) times daily. , Disp: , Rfl:    diclofenac Sodium (VOLTAREN) 1 % GEL, Apply 1 application topically daily as needed (Wrist pain). , Disp: , Rfl:    dicyclomine (BENTYL) 20 MG tablet, Take 1 tablet (20 mg total) by mouth 3 (three) times daily as needed for spasms (abdominal cramping). (Patient taking differently: Take 20 mg by mouth 2 (two) times daily as needed (abdominal cramping). ), Disp: 20 tablet, Rfl: 0   famotidine (PEPCID) 40 MG tablet, Take 1 tablet (40 mg total) by mouth 2 (two) times daily. For 12 weeks, then go back to 20mg  twice daily. (Patient taking differently: Take 20 mg by mouth 2 (two) times daily. ), Disp: 60 tablet, Rfl: 3   fluticasone (FLONASE) 50 MCG/ACT nasal spray, Place 1 spray into both nostrils daily as needed for allergies. , Disp: , Rfl:    fluvoxaMINE (LUVOX) 100 MG tablet, Take 3 tablets (300 mg total) by mouth at bedtime., Disp: 270 tablet, Rfl: 1   gabapentin (NEURONTIN) 600 MG tablet, Take 2 tablets (1,200 mg total) by mouth 2 (two) times daily., Disp: 120 tablet, Rfl: 5   hydrOXYzine (ATARAX/VISTARIL) 25 MG tablet, Take 1 tablet (25 mg total) by mouth 3 (three) times daily as needed. (Patient taking differently: Take 25 mg by mouth daily as needed [employer]). ), Disp: 60 tablet, Rfl: 1   loratadine (CLARITIN) 10 MG tablet, Take 5 mg by mouth daily as needed for allergies. , Disp: , Rfl:    metroNIDAZOLE (METROGEL) 1 % gel, Apply topically daily. (Patient taking differently: Apply 1 application topically daily as needed (Breakout on face). ), Disp: 45 g, Rfl: 0   mirtazapine (REMERON) 7.5 MG tablet, Take 1-2 tablets by mouth at bedtime as needed for sleep, Disp: 60 tablet, Rfl: 0   naproxen sodium (ALEVE) 220 MG tablet, Take 440-660 mg by mouth daily as needed (Pt takes with Nurtec as needed.). , Disp: , Rfl:    olopatadine (PATANOL) 0.1 % ophthalmic solution, Place 1 drop into both eyes daily as needed  for allergies. , Disp: , Rfl:    ondansetron (ZOFRAN-ODT) 4 MG disintegrating tablet, TAKE ONE TABLET (4MG  TOTAL) BY MOUTH EVERY 8 HOURS AS NEEDED FOR NAUSEA OR VOMITING, Disp: 30 tablet, Rfl: 1   promethazine-dextromethorphan (PROMETHAZINE-DM) 6.25-15 MG/5ML syrup, Take 2.5 mLs by mouth 4 (four) times daily as needed for cough., Disp: 118 mL, Rfl: 0   Rimegepant Sulfate (NURTEC) 75 MG TBDP, Take 75 mg by mouth daily as needed (take for abortive therapy of migraine, no more than 1 tablet in 24 hours or 10 per month)., Disp: 10 tablet, Rfl: 11   traZODone (DESYREL) 100 MG tablet, Take 1-2 tablets (100-200 mg total) by mouth at bedtime as needed for sleep. (Patient taking differently: Take 100 mg by mouth at bedtime as needed for sleep. ), Disp: 60 tablet, Rfl: 5   valACYclovir (VALTREX) 500 MG tablet, Take 500 mg by mouth daily as needed (Flair up take Twice a day  for 3 days and stop). , Disp: , Rfl:    hyoscyamine (LEVSIN) 0.125 MG tablet, Take 1 tablet (0.125 mg total) by mouth every 4 (four) hours as needed (diarrhea). (Patient not taking: Reported on 11/28/2019), Disp: 120 tablet, Rfl: 1   lidocaine-prilocaine (EMLA)  cream, Apply 1 application topically as needed. Apply to area 45 min prior to procedure (Patient not taking: Reported on 11/28/2019), Disp: 5 g, Rfl: 0 Medication Side Effects: none  Family Medical/ Social History: Changes? No  MENTAL HEALTH EXAM:  Last menstrual period 10/07/2011.There is no height or weight on file to calculate BMI.  General Appearance: Casual, Neat and Well Groomed  Eye Contact:  Good  Speech:  Clear and Coherent and Normal Rate  Volume:  Normal  Mood:  Euthymic  Affect:  Appropriate  Thought Process:  Goal Directed and Descriptions of Associations: Intact  Orientation:  Full (Time, Place, and Person)  Thought Content: Logical   Suicidal Thoughts:  No  Homicidal Thoughts:  No  Memory:  Some decrease in immediate and recent memory but not worse.   Judgement:  Good  Insight:  Good  Psychomotor Activity:  unable to assess   Concentration:  Concentration: Good  Recall:  Good  Fund of Knowledge: Good  Language: Good  Assets:  Desire for Improvement  ADL's:  Intact  Cognition: WNL  Prognosis:  Good    DIAGNOSES:    ICD-10-CM   1. Stress due to family tension  Z63.8   2. Generalized anxiety disorder  F41.1   3. Bipolar I disorder, most recent episode depressed ([place])  F31.30   4. Eating disorder, unspecified type  F50.9     Receiving Psychotherapy: Yes   [facility]                 RECOMMENDATIONS:  PDMP reviewed.  I provided 20 minutes of nonface-to-face care during this encounter.  Disc the [name]. May need to increase to 4 mg, but not now. What she went through last week was situational and nl for anyone, whether they have Bipolar d/o or not.  Continue [name] 3 mg, 1 p.o. daily for now. Call in 7-10 days if need to increase to 4 mg.  Continue Klonopin 0.5 mg, 1 p.o. twice daily as needed.  She takes pretty rarely. Continue Luvox 100 mg, 3 p.o. nightly. Continue gabapentin 600 mg, 2 p.o. twice daily. Decrease hydroxyzine to 25 mg, 1/2-1 every 8 hours as needed anxiety. She rarely takes.  Take either Trazodone or Mirtzepine. Not both.  Continue therapy. Return in 4-6 weeks.   [name], PA-C

## 2019-12-07 ENCOUNTER — Other Ambulatory Visit: Payer: Self-pay | Admitting: Physician Assistant

## 2019-12-07 ENCOUNTER — Encounter: Payer: Self-pay | Admitting: Nurse Practitioner

## 2019-12-07 ENCOUNTER — Other Ambulatory Visit: Payer: Self-pay

## 2019-12-07 ENCOUNTER — Ambulatory Visit: Payer: Medicare PPO | Admitting: Nurse Practitioner

## 2019-12-07 ENCOUNTER — Telehealth: Payer: Self-pay | Admitting: Physician Assistant

## 2019-12-07 VITALS — BP 122/75 | HR 75 | Temp 96.9°F | Ht 64.0 in | Wt 168.0 lb

## 2019-12-07 DIAGNOSIS — K219 Gastro-esophageal reflux disease without esophagitis: Secondary | ICD-10-CM

## 2019-12-07 DIAGNOSIS — F502 Bulimia nervosa, unspecified: Secondary | ICD-10-CM

## 2019-12-07 DIAGNOSIS — R197 Diarrhea, unspecified: Secondary | ICD-10-CM

## 2019-12-07 MED ORDER — REXULTI 4 MG PO TABS: 4.0000 mg | ORAL_TABLET | Freq: Every morning | ORAL | 1 refills | Status: DC

## 2019-12-07 MED ORDER — SUCRALFATE 1 G PO TABS: 1.0000 g | ORAL_TABLET | Freq: Three times a day (TID) | ORAL | 3 refills | Status: DC | PRN

## 2019-12-07 NOTE — Patient Instructions (Signed)
Your health issues we discussed today were:   GERD (reflux/heartburn): 1. Continue taking Pepcid as you have been 2. I sent a refill of Carafate to your pharmacy 3. Let us know if you have any worsening symptoms  Bulimia: 1. As we discussed, this is likely a significant contributor to your abdominal pain and reflux symptoms 2. Ongoing bulimia, especially with purging, can call us erosions and irritation of your esophagus (swallowing tube), dental erosions, and other potential complications 3. I am glad you are seeing therapy and making progress!  Continue to see therapy and work toward treatment  Diarrhea: 1. Glad your symptoms have resolved! 2. Continue to monitor for any worsening or recurrent symptoms and notify us if you experience any  Overall I recommend:  1. Continue your other current medications 2. Return for follow-up in 6 months 3. Call for any questions or concerns   ---------------------------------------------------------------  I am glad you have gotten your COVID-19 vaccination!  Even though you are fully vaccinated you should continue to follow CDC and state/local guidelines.  ---------------------------------------------------------------   At [facility] we value your feedback. You may receive a survey about your visit today. Please share your experience as we strive to create trusting relationships with our patients to provide genuine, compassionate, quality care.  We appreciate your understanding and patience as we review any laboratory studies, imaging, and other diagnostic tests that are ordered as we care for you. Our office policy is 5 business days for review of these results, and any emergent or urgent results are addressed in a timely manner for your best interest. If you do not hear from our office in 1 week, please contact us.   We also encourage the use of MyChart, which contains your medical information for your review as well. If you  are not enrolled in this feature, an access code is on this after visit summary for your convenience. Thank you for allowing [place] to be involved in your care.  It was great to see you today!  I hope you have a Happy Thanksgiving!!

## 2019-12-07 NOTE — Telephone Encounter (Signed)
Rx was sent in. 

## 2019-12-07 NOTE — Progress Notes (Signed)
Cc'ed to pcp °

## 2019-12-07 NOTE — Progress Notes (Signed)
Referring Provider: Perlie Mayo, NP Primary Care Physician:  Perlie Mayo, NP Primary GI:  Dr. Gala Romney  Chief Complaint  Patient presents with  . Abdominal Pain    occ with eating; wants rx for Carafate prn    HPI:   Jennifer [name] is a 54 y.o. female who presents for post procedure follow-up.  The patient was last seen in our office 09/05/2019 for diarrhea, GERD, bulimia, positive colorectal cancer screening using Cologuard.  Past medical history significant for anxiety, binge eating/purging, OCD, bipolar, interstitial cystitis.  EGD completed September 2020 with mild reflux esophagitis and deemed worsening abdominal pain due to vomiting.  Known history of IBS as well.  The patient felt IBS-D may be worsened by Protonix that she was switched to AcipHex (cause constipation) since subsequently switched to [place] (makes me "really sick" with a lot of diarrhea).  Family advised to continue Pepcid twice a day with Mylanta and Carafate as needed.  CT renal stone study unremarkable.  CBC and LFTs unremarkable.  She was previously scheduled for colonoscopy but canceled and deferred to Cologuard which unfortunately came back positive.  At her last visit denied rectal bleeding, GERD doing well on Pepcid.  Has not needed diarrhea medications in some time.  Typically 4 stools a day ranging from [facility] 1-6.  Abdominal pain intermittent with bulimia, has not seen a therapist but has an appointment upcoming.  Still purging about 3-4 times a night (not as often as previous).  Associated esophageal discomfort.  No other overt GI complaints.  Is on Aleve as needed.  Recommended colonoscopy, follow-up in 3 months.  Colonoscopy was completed 10/05/2019 which found two 2 to 4 mm polyps in the sigmoid colon found to be hyperplastic on surgical pathology.  Recommended repeat colonoscopy in 10 years (2031).  Today she states she doing okay overall. Pepcid overall working well, occasional breakthrough with  certain foods. Requesting Carafate refill. When this happens has some abdominal pain. Admits bulemia which she feels causes her abdominal pain. Occasional nausea with migraines. Vomiting intentionally with purging. Seeing therapy every few weeks and thinks she making progress. Denies hematochezia, melena, fever, chills, unintentional weight loss. Denies URI or flu-like symptoms. Denies loss of sense of taste or smell. The patient has received COVID-19 vaccination(s). Denies chest pain, dyspnea, dizziness, lightheadedness, syncope, near syncope. Denies any other upper or lower GI symptoms.  Stools doing well, no diarrhea ongoing. Has about 3 stools a day, consistent with [place] 4.   Past Medical History:  Diagnosis Date  . Abdominal pain, epigastric 10/07/2018  . Anxiety   . Binge-eating and purging type anorexia nervosa   . Bipolar disorder ([place])   . Cellulitis and abscess of buttock 02/15/2013  . Cystitis, interstitial   . Depression   . Esophageal polyp    about 20 years ago  . GERD (gastroesophageal reflux disease)   . Left wrist pain 03/22/2019  . OCD (obsessive compulsive disorder)   . Palpitations 05/25/2014  . Rib pain on right side 01/31/2019  . Rosacea 03/27/2019  . Sprain of temporomandibular joint or ligament 01/10/2019    Past Surgical History:  Procedure Laterality Date  . BLADDER SURGERY    . CHOLECYSTECTOMY    . COLONOSCOPY WITH PROPOFOL N/A 10/05/2019   Procedure: COLONOSCOPY WITH PROPOFOL;  Surgeon: Daneil Dolin, MD;  [facility]: [facility];  Service: Endoscopy;  Laterality: N/A;  12:45pm  . ESOPHAGOGASTRODUODENOSCOPY  09/28/2016   [facility]; Dr. Therisa Doyne; erosions in  the esophagus, 5 cm hiatal hernia, nonbleeding erosive gastropathy s/p biopsied, normal duodenum.  Path with chronic inactive gastritis, no H. pylori or intestinal metaplasia.   Marland Kitchen ESOPHAGOGASTRODUODENOSCOPY (EGD) WITH PROPOFOL N/A 10/17/2018   Dr. Gala Romney: mild reflux esophagitis, small hiatal hernia  .  POLYPECTOMY  10/05/2019   Procedure: POLYPECTOMY;  Surgeon: Daneil Dolin, MD;  Location: [facility];  Service: Endoscopy;;  . VOCAL CORD LATERALIZATION, ENDOSCOPIC APPROACH W/ MLB      Current Outpatient Medications  Medication Sig Dispense Refill  . acetaminophen (TYLENOL) 500 MG tablet Take 1,000 mg by mouth daily as needed for fever.     . Brexpiprazole ([name]) 3 MG TABS Take 1 tablet (3 mg total) by mouth in the morning. 30 tablet 5  . clonazePAM (KLONOPIN) 0.5 MG tablet Take 1 tablet (0.5 mg total) by mouth 2 (two) times daily as needed for anxiety. 20 tablet 5  . cycloSPORINE (RESTASIS) 0.05 % ophthalmic emulsion Place 1 drop into both eyes 2 (two) times daily.     Marland Kitchen dicyclomine (BENTYL) 20 MG tablet Take 1 tablet (20 mg total) by mouth 3 (three) times daily as needed for spasms (abdominal cramping). (Patient taking differently: Take 20 mg by mouth 2 (two) times daily as needed (abdominal cramping). ) 20 tablet 0  . famotidine (PEPCID) 40 MG tablet Take 1 tablet (40 mg total) by mouth 2 (two) times daily. For 12 weeks, then go back to 20mg  twice daily. (Patient taking differently: Take 20 mg by mouth 2 (two) times daily. ) 60 tablet 3  . fluticasone (FLONASE) 50 MCG/ACT nasal spray Place 1 spray into both nostrils daily as needed for allergies.     . fluvoxaMINE (LUVOX) 100 MG tablet Take 3 tablets (300 mg total) by mouth at bedtime. 270 tablet 1  . gabapentin (NEURONTIN) 600 MG tablet Take 2 tablets (1,200 mg total) by mouth 2 (two) times daily. 120 tablet 5  . hydrOXYzine (ATARAX/VISTARIL) 25 MG tablet Take 1 tablet (25 mg total) by mouth 3 (three) times daily as needed. (Patient taking differently: Take 25 mg by mouth daily as needed [employer]). ) 60 tablet 1  . hyoscyamine (LEVSIN) 0.125 MG tablet Take 1 tablet (0.125 mg total) by mouth every 4 (four) hours as needed (diarrhea). 120 tablet 1  . loratadine (CLARITIN) 10 MG tablet Take 5 mg by mouth daily as needed for  allergies.     . mirtazapine (REMERON) 7.5 MG tablet Take 1-2 tablets by mouth at bedtime as needed for sleep 60 tablet 0  . naproxen sodium (ALEVE) 220 MG tablet Take 440-660 mg by mouth daily as needed (Pt takes with Nurtec as needed.).     Marland Kitchen olopatadine (PATANOL) 0.1 % ophthalmic solution Place 1 drop into both eyes daily as needed for allergies.     Marland Kitchen ondansetron (ZOFRAN-ODT) 4 MG disintegrating tablet TAKE ONE TABLET (4MG  TOTAL) BY MOUTH EVERY 8 HOURS AS NEEDED FOR NAUSEA OR VOMITING 30 tablet 1  . Rimegepant Sulfate (NURTEC) 75 MG TBDP Take 75 mg by mouth daily as needed (take for abortive therapy of migraine, no more than 1 tablet in 24 hours or 10 per month). 10 tablet 11  . traZODone (DESYREL) 100 MG tablet Take 1-2 tablets (100-200 mg total) by mouth at bedtime as needed for sleep. (Patient taking differently: Take 100 mg by mouth at bedtime as needed for sleep. ) 60 tablet 5  . valACYclovir (VALTREX) 500 MG tablet Take 500 mg by mouth daily  as needed (Flair up take Twice a day  for 3 days and stop).     . sucralfate (CARAFATE) 1 g tablet Take 1 tablet (1 g total) by mouth 3 (three) times daily as needed. 30 tablet 3   No current facility-administered medications for this visit.    Allergies as of 12/07/2019 - Review Complete 12/07/2019  Allergen Reaction Noted  . Codeine Shortness Of Breath, Nausea Only, and Rash 02/15/2013  . Penicillins Hives, Shortness Of Breath, Swelling, and Rash 10/09/2011  . Meloxicam Other (See Comments) 05/08/2014  . Sonata [zaleplon] Other (See Comments) 10/22/2017  . Topamax [topiramate] Other (See Comments) 10/04/2018  . Aimovig [erenumab-aooe] Itching 06/26/2019  . Emgality [galcanezumab-gnlm] Rash 10/04/2018    Family History  Problem Relation Age of Onset  . Healthy Mother   . Healthy Father   . Colon cancer Neg Hx   . Colon polyps Neg Hx     Social History   Socioeconomic History  . Marital status: Married    Spouse name: [name]  . Number  of children: 0  . Years of education: Not on file  . Highest education level: Not on file  Occupational History  . Not on file  Tobacco Use  . Smoking status: Current Every Day Smoker    Packs/day: 1.00    Years: 20.00    Pack years: 20.00    Types: Cigarettes    Start date: 01/20/1988  . Smokeless tobacco: Never Used  Vaping Use  . Vaping Use: Never used  Substance and Sexual Activity  . Alcohol use: No    Alcohol/week: 0.0 standard drinks  . Drug use: No  . Sexual activity: Yes    Birth control/protection: None  Other Topics Concern  . 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 Difficulty of Paying Living Expenses: Not hard at all  Food Insecurity: No Food Insecurity  . Worried About Charity fundraiser in the Last Year: Never true  . Ran Out of Food in the Last Year: Never true  Transportation Needs: No Transportation Needs  . Lack of Transportation (Medical): No  . Lack of Transportation (Non-Medical): No  Physical Activity: Inactive  . Days of Exercise per Week: 0 days  . Minutes of Exercise per Session: 0 min  Stress: No Stress Concern Present  . Feeling of Stress : Only a little  Social Connections: Moderately Isolated  . Frequency of Communication with Friends and Family: Once a week  . Frequency of Social Gatherings with Friends and Family: Once a week  . Attends Religious Services: More than 4 times per year  . Active Member of Clubs or Organizations: No  . Attends Archivist Meetings: Never  . Marital Status: Married     Subjective: Review of Systems  Constitutional: Negative for chills, fever, malaise/fatigue and weight loss.  HENT: Negative for congestion and sore throat.   Respiratory: Negative for cough and shortness of breath.   Cardiovascular: Negative for chest pain and palpitations.  Gastrointestinal: Positive for heartburn (rare). Negative for abdominal pain, blood in stool, constipation, diarrhea, melena, nausea and vomiting.  Musculoskeletal: Negative for joint pain and myalgias.  Skin: Negative for rash.  Neurological: Negative for dizziness and weakness.  Endo/Heme/Allergies: Does not bruise/bleed easily.  Psychiatric/Behavioral: Negative for depression. The patient is not nervous/anxious.   All other systems reviewed and are negative.    Objective: BP 122/75   Pulse 75   Temp (!) 96.9 F (36.1 C) (Temporal)   Ht 5\' 4"  (1.626 m)   Wt 168 lb (76.2 kg)   LMP 10/07/2011   BMI 28.84 kg/m  Physical Exam Vitals and nursing note reviewed.  Constitutional:      General: She is not in acute distress.    Appearance: Normal appearance. She is well-developed and normal weight. She is not ill-appearing, toxic-appearing or diaphoretic.  HENT:     Head: Normocephalic and atraumatic.     Nose: No congestion or rhinorrhea.  Eyes:     General: No scleral icterus. Cardiovascular:     Rate and Rhythm: Normal rate and regular rhythm.     Heart sounds: Normal heart sounds.  Pulmonary:     Effort: Pulmonary effort is normal. No respiratory distress.     Breath sounds: Normal breath sounds.  Abdominal:     General: Bowel sounds are normal.     Palpations: Abdomen is soft. There is no hepatomegaly, splenomegaly or mass.     Tenderness: There is no abdominal tenderness. There is no guarding or rebound.     Hernia: No hernia is present.  Skin:    General: Skin is warm and dry.     Coloration: Skin is not jaundiced.     Findings: No rash.  Neurological:     General: No focal deficit  present.     Mental Status: She is alert and oriented to person, place, and time.  Psychiatric:        Attention and Perception: Attention normal.        Mood and Affect: Mood normal.        Speech: Speech normal.        Behavior: Behavior normal.        Thought Content: Thought content normal.        Cognition and Memory: Cognition and memory normal.      Assessment:  Very pleasant 54 year old female presents for follow-up on GERD, diarrhea.  She attributes a large portion of her abdominal pain, that is now rare, to bulimia with binging/purging daily.  No red flag/warning signs or symptoms.  Overall clinically improved.    Bulimia: She has started seeing therapy once a week and feels like she is making some progress.  She does continue to binge/purge daily.  We discussed the potential for erosive esophagitis and other similar complications from ongoing binging/purging.  Dental erosions are possible/likely as well.  She verbalized understanding.    GERD: Binging/purging is likely significantly contributory to her reflux and abdominal pain.  In the meantime Pepcid manages her symptoms pretty well most the time.  Occasionally, with certain foods, she will have some breakthrough abdominal pain and Carafate is generally effective but she has run out and is asking for refill.  I have sent a refill to her pharmacy.  Recommend trigger avoidance.  Diarrhea: Essentially resolved at this time.  Has 3 bowel movements a day that are consistent with [facility] 4.  No straining/hard stools but no loose stools either.  Recommend she continue to monitor notify us of worsening symptoms   Plan: 1. Continue current medications 2. Refill of Carafate sent to pharmacy 3. Continue seeing therapy related to bulimia 4. Notify us of any worsening or recurrent symptoms 5. Follow-up in 6 months.    Thank you for allowing [place] to participate in the care of Rhunette Croft, DNP, AGNP-C Adult &  Gerontological Nurse Practitioner [facility]   12/07/2019 9:22 AM   Disclaimer: This note was dictated with voice recognition software. Similar sounding words can inadvertently be transcribed and may not be corrected upon review.

## 2019-12-07 NOTE — Telephone Encounter (Signed)
Please review

## 2019-12-07 NOTE — Telephone Encounter (Signed)
Pt called and said that her anxiety is worse and she is having more break downs. She would like  teresa to send in 4 mg of rexulti to the [facility].

## 2019-12-22 ENCOUNTER — Ambulatory Visit: Payer: Medicare PPO | Admitting: Podiatry

## 2019-12-22 ENCOUNTER — Other Ambulatory Visit: Payer: Self-pay

## 2019-12-22 DIAGNOSIS — L600 Ingrowing nail: Secondary | ICD-10-CM | POA: Diagnosis not present

## 2019-12-26 ENCOUNTER — Encounter: Payer: Self-pay | Admitting: Podiatry

## 2019-12-26 NOTE — Progress Notes (Signed)
Subjective:  Patient ID: Jennifer [name], female    DOB: 1965-07-06,  MRN: [id number]  Chief Complaint  Patient presents with  . Ingrown Toenail    pt states she has ingrowns on both bilateral big toes.     54 y.o. female presents with the above complaint. Patient presents with complaint of bilateral medial border ingrown hallux. Patient states it painful to touch. She states been going on for quite some time. She has tried some self debridement but has been hurting at the tip of the toes. She would like to have removed. She denies any other acute complaints. She has not seen anyone else prior to seeing me.   Review of Systems: Negative except as noted in the HPI. Denies N/V/F/Ch.  Past Medical History:  Diagnosis Date  . Abdominal pain, epigastric 10/07/2018  . Anxiety   . Binge-eating and purging type anorexia nervosa   . Bipolar disorder ([place])   . Cellulitis and abscess of buttock 02/15/2013  . Cystitis, interstitial   . Depression   . Esophageal polyp    about 20 years ago  . GERD (gastroesophageal reflux disease)   . Left wrist pain 03/22/2019  . OCD (obsessive compulsive disorder)   . Palpitations 05/25/2014  . Rib pain on right side 01/31/2019  . Rosacea 03/27/2019  . Sprain of temporomandibular joint or ligament 01/10/2019    Current Outpatient Medications:  .  acetaminophen (TYLENOL) 500 MG tablet, Take 1,000 mg by mouth daily as needed for fever. , Disp: , Rfl:  .  Brexpiprazole ([name]) 4 MG TABS, Take 4 mg by mouth in the morning., Disp: 30 tablet, Rfl: 1 .  clonazePAM (KLONOPIN) 0.5 MG tablet, Take 1 tablet (0.5 mg total) by mouth 2 (two) times daily as needed for anxiety., Disp: 20 tablet, Rfl: 5 .  cycloSPORINE (RESTASIS) 0.05 % ophthalmic emulsion, Place 1 drop into both eyes 2 (two) times daily. , Disp: , Rfl:  .  dicyclomine (BENTYL) 20 MG tablet, Take 1 tablet (20 mg total) by mouth 3 (three) times daily as needed for spasms (abdominal cramping). (Patient taking  differently: Take 20 mg by mouth 2 (two) times daily as needed (abdominal cramping). ), Disp: 20 tablet, Rfl: 0 .  famotidine (PEPCID) 40 MG tablet, Take 1 tablet (40 mg total) by mouth 2 (two) times daily. For 12 weeks, then go back to 20mg  twice daily. (Patient taking differently: Take 20 mg by mouth 2 (two) times daily. ), Disp: 60 tablet, Rfl: 3 .  fluticasone (FLONASE) 50 MCG/ACT nasal spray, Place 1 spray into both nostrils daily as needed for allergies. , Disp: , Rfl:  .  fluvoxaMINE (LUVOX) 100 MG tablet, Take 3 tablets (300 mg total) by mouth at bedtime., Disp: 270 tablet, Rfl: 1 .  gabapentin (NEURONTIN) 600 MG tablet, Take 2 tablets (1,200 mg total) by mouth 2 (two) times daily., Disp: 120 tablet, Rfl: 5 .  hydrOXYzine (ATARAX/VISTARIL) 25 MG tablet, Take 1 tablet (25 mg total) by mouth 3 (three) times daily as needed. (Patient taking differently: Take 25 mg by mouth daily as needed [employer]). ), Disp: 60 tablet, Rfl: 1 .  hyoscyamine (LEVSIN) 0.125 MG tablet, Take 1 tablet (0.125 mg total) by mouth every 4 (four) hours as needed (diarrhea)., Disp: 120 tablet, Rfl: 1 .  loratadine (CLARITIN) 10 MG tablet, Take 5 mg by mouth daily as needed for allergies. , Disp: , Rfl:  .  mirtazapine (REMERON) 7.5 MG tablet, Take 1-2 tablets by mouth  at bedtime as needed for sleep, Disp: 60 tablet, Rfl: 0 .  naproxen sodium (ALEVE) 220 MG tablet, Take 440-660 mg by mouth daily as needed (Pt takes with Nurtec as needed.). , Disp: , Rfl:  .  olopatadine (PATANOL) 0.1 % ophthalmic solution, Place 1 drop into both eyes daily as needed for allergies. , Disp: , Rfl:  .  ondansetron (ZOFRAN-ODT) 4 MG disintegrating tablet, TAKE ONE TABLET (4MG  TOTAL) BY MOUTH EVERY 8 HOURS AS NEEDED FOR NAUSEA OR VOMITING, Disp: 30 tablet, Rfl: 1 .  Rimegepant Sulfate (NURTEC) 75 MG TBDP, Take 75 mg by mouth daily as needed (take for abortive therapy of migraine, no more than 1 tablet in 24 hours or 10 per month)., Disp: 10  tablet, Rfl: 11 .  sucralfate (CARAFATE) 1 g tablet, Take 1 tablet (1 g total) by mouth 3 (three) times daily as needed., Disp: 30 tablet, Rfl: 3 .  traZODone (DESYREL) 100 MG tablet, Take 1-2 tablets (100-200 mg total) by mouth at bedtime as needed for sleep. (Patient taking differently: Take 100 mg by mouth at bedtime as needed for sleep. ), Disp: 60 tablet, Rfl: 5 .  valACYclovir (VALTREX) 500 MG tablet, Take 500 mg by mouth daily as needed (Flair up take Twice a day  for 3 days and stop). , Disp: , Rfl:   Social History   Tobacco Use  Smoking Status Current Every Day Smoker  . Packs/day: 1.00  . Years: 20.00  . Pack years: 20.00  . Types: Cigarettes  . Start date: 01/20/1988  Smokeless Tobacco Never Used    Allergies  Allergen Reactions  . Codeine Shortness Of Breath, Nausea Only and Rash  . Penicillins Hives, Shortness Of Breath, Swelling and Rash    Has patient had a PCN reaction causing immediate rash, facial/tongue/throat swelling, SOB or lightheadedness with hypotension: yes Has patient had a PCN reaction causing severe rash involving mucus membranes or skin necrosis: no Has patient had a PCN reaction that required hospitalization: no Has patient had a PCN reaction occurring within the last 10 years: no If all of the above answers are "NO", then may proceed with Cephalosporin use.;     . Meloxicam Other (See Comments)    Possible chest tightness - instructed by MD not to take  . Sonata [Zaleplon] Other (See Comments)    Hallucinations  . Topamax [Topiramate] Other (See Comments)    Low BP and dizziness  . Aimovig [Erenumab-Aooe] Itching  . Emgality [Galcanezumab-Gnlm] Rash   Objective:  There were no vitals filed for this visit. There is no height or weight on file to calculate BMI. Constitutional Well developed. Well nourished.  Vascular Dorsalis pedis pulses palpable bilaterally. Posterior tibial pulses palpable bilaterally. Capillary refill normal to all digits.   No cyanosis or clubbing noted. Pedal hair growth normal.  Neurologic Normal speech. Oriented to person, place, and time. Epicritic sensation to light touch grossly present bilaterally.  Dermatologic Painful ingrowing nail at medial nail borders of the hallux nail bilaterally. No other open wounds. No skin lesions.  Orthopedic: Normal joint ROM without pain or crepitus bilaterally. No visible deformities. No bony tenderness.   Radiographs: None Assessment:   1. Ingrown toenail of right foot   2. Ingrown left big toenail    Plan:  Patient was evaluated and treated and all questions answered.  Ingrown Nail, bilaterally -Patient elects to proceed with minor surgery to remove ingrown toenail removal today. Consent reviewed and signed by patient. -Ingrown nail excised.  See procedure note. -Educated on post-procedure care including soaking. Written instructions provided and reviewed. -Patient to follow up in 2 weeks for nail check.  Procedure: Excision of Ingrown Toenail Location: Bilateral 1st toe medial nail borders. Anesthesia: Lidocaine 1% plain; 1.5 mL and Marcaine 0.5% plain; 1.5 mL, digital block. Skin Prep: Betadine. Dressing: Silvadene; telfa; dry, sterile, compression dressing. Technique: Following skin prep, the toe was exsanguinated and a tourniquet was secured at the base of the toe. The affected nail border was freed, split with a nail splitter, and excised. Chemical matrixectomy was then performed with phenol and irrigated out with alcohol. The tourniquet was then removed and sterile dressing applied. Disposition: Patient tolerated procedure well. Patient to return in 2 weeks for follow-up.   No follow-ups on file.

## 2020-01-02 ENCOUNTER — Other Ambulatory Visit: Payer: Self-pay | Admitting: Plastic Surgery

## 2020-01-04 ENCOUNTER — Encounter: Payer: Self-pay | Admitting: Family Medicine

## 2020-01-05 NOTE — Telephone Encounter (Signed)
Do you know if there is a [facility] resource groups for this? I think employees have something to help, but unsure of patients.

## 2020-01-05 NOTE — Telephone Encounter (Signed)
error 

## 2020-01-11 NOTE — Telephone Encounter (Signed)
[facility] took care of this

## 2020-01-22 ENCOUNTER — Telehealth (INDEPENDENT_AMBULATORY_CARE_PROVIDER_SITE_OTHER): Payer: Medicare PPO | Admitting: Physician Assistant

## 2020-01-22 ENCOUNTER — Encounter: Payer: Self-pay | Admitting: Physician Assistant

## 2020-01-22 DIAGNOSIS — F319 Bipolar disorder, unspecified: Secondary | ICD-10-CM | POA: Diagnosis not present

## 2020-01-22 DIAGNOSIS — G4700 Insomnia, unspecified: Secondary | ICD-10-CM | POA: Diagnosis not present

## 2020-01-22 DIAGNOSIS — F429 Obsessive-compulsive disorder, unspecified: Secondary | ICD-10-CM | POA: Diagnosis not present

## 2020-01-22 DIAGNOSIS — F411 Generalized anxiety disorder: Secondary | ICD-10-CM

## 2020-01-22 MED ORDER — HYDROXYZINE HCL 25 MG PO TABS: 25.0000 mg | ORAL_TABLET | Freq: Three times a day (TID) | ORAL | 1 refills | Status: DC | PRN

## 2020-01-22 MED ORDER — REXULTI 4 MG PO TABS: 4.0000 mg | ORAL_TABLET | Freq: Every morning | ORAL | 11 refills | Status: DC

## 2020-01-22 MED ORDER — TRAZODONE HCL 100 MG PO TABS: 100.0000 mg | ORAL_TABLET | Freq: Every evening | ORAL | 11 refills | Status: DC | PRN
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
## 2020-01-29 ENCOUNTER — Telehealth: Payer: Self-pay

## 2020-01-29 NOTE — Telephone Encounter (Signed)
Prior Authorization submitted and approved for [name] 4 MG effective 01/20/2020-01/18/2021 with [facility] H# [id number]

## 2020-01-30 ENCOUNTER — Encounter: Payer: Self-pay | Admitting: Family Medicine

## 2020-01-30 ENCOUNTER — Telehealth (INDEPENDENT_AMBULATORY_CARE_PROVIDER_SITE_OTHER): Payer: Medicare PPO | Admitting: Family Medicine

## 2020-01-30 ENCOUNTER — Other Ambulatory Visit: Payer: Self-pay

## 2020-01-30 VITALS — Ht 64.0 in | Wt 158.0 lb

## 2020-01-30 DIAGNOSIS — F3160 Bipolar disorder, current episode mixed, unspecified: Secondary | ICD-10-CM | POA: Diagnosis not present

## 2020-01-30 DIAGNOSIS — G43909 Migraine, unspecified, not intractable, without status migrainosus: Secondary | ICD-10-CM | POA: Diagnosis not present

## 2020-01-30 NOTE — Assessment & Plan Note (Signed)
No longer seeing the chiropractor that she is having is not frequently.  He is being followed by neurology now.  Working on taking medications to help with this has had migraines today will be taking medications when she hangs up that she can go lay down and rest.  Reports that she thinks the treatment by the neurologist is working very well but she does have headaches more frequently and they might need to adjust things in the near future she will follow-up with her when she has .

## 2020-01-30 NOTE — Patient Instructions (Addendum)
  I appreciate the opportunity to provide you with care for your health and wellness.  Follow up: March CPE -with fasting labs same day  Keep working on smoking reduction, use of patches is a great option  Increase water intake.  Walk daily for 30 mins  Please continue to practice social distancing to keep you, your family, and our community safe.  If you must go out, please wear a mask and practice good handwashing.  It was a pleasure to see you and I look forward to continuing to work together on your health and well-being. Please do not hesitate to call the office if you need care or have questions about your care.  Have a wonderful day. With Gratitude, Cherly Beach, DNP, AGNP-BC     .

## 2020-01-30 NOTE — Assessment & Plan Note (Signed)
Is followed closely by psychiatry and agrees room.  Overall doing well working on mindfulness meditation and body scanning she reports this is doing really good for her she does have times where she falls asleep but overall she is enjoying it.

## 2020-01-30 NOTE — Progress Notes (Signed)
Virtual Visit via Telephone Note   This visit type was conducted due to national recommendations for restrictions regarding the COVID-19 Pandemic (e.g. social distancing) in an effort to limit this patient's exposure and mitigate transmission in our community.  Due to her co-morbid illnesses, this patient is at least at moderate risk for complications without adequate follow up.  This format is felt to be most appropriate for this patient at this time.  The patient did not have access to video technology/had technical difficulties with video requiring transitioning to audio format only (telephone).  All issues noted in this document were discussed and addressed.  No physical exam could be performed with this format.    Evaluation Performed:  Follow-up visit  Date:  01/30/2020   ID:  Jennifer [name], DOB [birth year], MRN [id number]  Patient Location: Home Provider Location: Office/Clinic  Participants: Nurse for intake and work up; Patient and Provider for Visit and Wrap up  Method of visit: Telephone  Location of Patient: Home Location of Provider: Office Consent was obtain for visit over the telephone. Services rendered by provider: Visit was performed via telephone.  I verified that I am speaking with the correct person using two identifiers.  PCP:  Perlie Mayo, NP   Chief Complaint: Migraine  History of Present Illness:    Jennifer [name] is a 55 y.o. female with history as stated below.  Presents today for follow-up.  Reports she had an active migraine right now.  We will be taking her medications as prescribed by her neurologist when she finishes up the appointment today.  She reports that she may need to do medication changes and will discuss this with her neurologist.  Additionally she has been working on body scanning mindfulness and meditation with her therapist to help with her bipolar disorder and reports that she is doing really well with this.  She is not having any  other issues or concerns today.  She denies having chest pain, cough, shortness of breath or any exposure to COVID at this time.  The patient does not have symptoms concerning for COVID-19 infection (fever, chills, cough, or new shortness of breath).   Past Medical, Surgical, Social History, Allergies, and Medications have been Reviewed.  Past Medical History:  Diagnosis Date  . Abdominal pain, epigastric 10/07/2018  . Acute non-recurrent maxillary sinusitis 11/07/2019  . Anxiety   . Binge-eating and purging type anorexia nervosa   . Bipolar affective disorder ([place]) 12/04/2015  . Bipolar affective disorder, current episode manic without psychotic symptoms ([place]) 12/04/2015  . Bipolar disorder ([place])   . Cellulitis and abscess of buttock 02/15/2013  . Cystitis, interstitial   . Depression   . Depression    Phreesia 01/28/2020  . Esophageal polyp    about 20 years ago  . GERD (gastroesophageal reflux disease)   . Left wrist pain 03/22/2019  . OCD (obsessive compulsive disorder)   . Palpitations 05/25/2014  . Rectal pain 12/06/2018  . Rib pain on right side 01/31/2019  . Rosacea 03/27/2019  . Sprain of temporomandibular joint or ligament 01/10/2019   Past Surgical History:  Procedure Laterality Date  . BLADDER SURGERY    . CHOLECYSTECTOMY    . COLONOSCOPY WITH PROPOFOL N/A 10/05/2019   Procedure: COLONOSCOPY WITH PROPOFOL;  Surgeon: Daneil Dolin, MD;  Location: [facility];  Service: Endoscopy;  Laterality: N/A;  12:45pm  . COSMETIC SURGERY N/A    Phreesia 01/28/2020  . ESOPHAGOGASTRODUODENOSCOPY  09/28/2016  [facility]; Dr. Therisa Doyne; erosions in the esophagus, 5 cm hiatal hernia, nonbleeding erosive gastropathy s/p biopsied, normal duodenum.  Path with chronic inactive gastritis, no H. pylori or intestinal metaplasia.   Marland Kitchen ESOPHAGOGASTRODUODENOSCOPY (EGD) WITH PROPOFOL N/A 10/17/2018   Dr. Gala Romney: mild reflux esophagitis, small hiatal hernia  . POLYPECTOMY  10/05/2019   Procedure:  POLYPECTOMY;  Surgeon: Daneil Dolin, MD;  Location: [facility];  Service: Endoscopy;;  . VOCAL CORD LATERALIZATION, ENDOSCOPIC APPROACH W/ MLB       Current Meds  Medication Sig  . acetaminophen (TYLENOL) 500 MG tablet Take 1,000 mg by mouth daily as needed for fever.   . Brexpiprazole ([name]) 4 MG TABS Take 4 mg by mouth in the morning.  . clonazePAM (KLONOPIN) 0.5 MG tablet Take 1 tablet (0.5 mg total) by mouth 2 (two) times daily as needed for anxiety.  . cycloSPORINE (RESTASIS) 0.05 % ophthalmic emulsion Place 1 drop into both eyes 2 (two) times daily.   Marland Kitchen dicyclomine (BENTYL) 20 MG tablet Take 1 tablet (20 mg total) by mouth 3 (three) times daily as needed for spasms (abdominal cramping).  . famotidine (PEPCID) 40 MG tablet Take 1 tablet (40 mg total) by mouth 2 (two) times daily. For 12 weeks, then go back to 20mg  twice daily. (Patient taking differently: Take 20 mg by mouth 2 (two) times daily.)  . fluticasone (FLONASE) 50 MCG/ACT nasal spray Place 1 spray into both nostrils daily as needed for allergies.  . fluvoxaMINE (LUVOX) 100 MG tablet Take 3 tablets (300 mg total) by mouth at bedtime.  . gabapentin (NEURONTIN) 600 MG tablet Take 2 tablets (1,200 mg total) by mouth 2 (two) times daily.  . hydrOXYzine (ATARAX/VISTARIL) 25 MG tablet Take 1 tablet (25 mg total) by mouth 3 (three) times daily as needed.  . hyoscyamine (LEVSIN) 0.125 MG tablet Take 1 tablet (0.125 mg total) by mouth every 4 (four) hours as needed (diarrhea).  . loratadine (CLARITIN) 10 MG tablet Take 5 mg by mouth daily as needed for allergies.   . naproxen sodium (ALEVE) 220 MG tablet Take 440-660 mg by mouth daily as needed (Pt takes with Nurtec as needed.).   Marland Kitchen olopatadine (PATANOL) 0.1 % ophthalmic solution Place 1 drop into both eyes daily as needed for allergies.   Marland Kitchen ondansetron (ZOFRAN-ODT) 4 MG disintegrating tablet TAKE ONE TABLET (4MG  TOTAL) BY MOUTH EVERY 8 HOURS AS NEEDED FOR NAUSEA OR VOMITING  .  Rimegepant Sulfate (NURTEC) 75 MG TBDP Take 75 mg by mouth daily as needed (take for abortive therapy of migraine, no more than 1 tablet in 24 hours or 10 per month).  . sucralfate (CARAFATE) 1 g tablet Take 1 tablet (1 g total) by mouth 3 (three) times daily as needed.  . traZODone (DESYREL) 100 MG tablet Take 1-2 tablets (100-200 mg total) by mouth at bedtime as needed for sleep.  . valACYclovir (VALTREX) 500 MG tablet Take 500 mg by mouth daily as needed (Flair up take Twice a day  for 3 days and stop).      Allergies:   Codeine, Penicillins, Divalproex sodium, Meloxicam, Sonata [zaleplon], Topamax [topiramate], Aimovig [erenumab-aooe], and Emgality [galcanezumab-gnlm]   ROS:   Please see the history of present illness.    All other systems reviewed and are negative.   Labs/Other Tests and Data Reviewed:    Recent Labs: 03/20/2019: ALT 22; BUN 16; Creatinine, Ser 0.83; Hemoglobin 13.4; Platelets 187; Potassium 3.4; Sodium 135   Recent Lipid Panel Lab  Results  Component Value Date/Time   CHOL 170 12/06/2015 06:09 AM   TRIG 95 12/06/2015 06:09 AM   HDL 61 12/06/2015 06:09 AM   CHOLHDL 2.8 12/06/2015 06:09 AM   LDLCALC 90 12/06/2015 06:09 AM    Wt Readings from Last 3 Encounters:  01/30/20 158 lb (71.7 kg)  12/07/19 168 lb (76.2 kg)  11/07/19 150 lb (68 kg)     Objective:    Vital Signs:  Ht 5\' 4"  (1.626 m)   Wt 158 lb (71.7 kg)   LMP 10/07/2011   BMI 27.12 kg/m    VITAL SIGNS:  reviewed GEN:  no acute distress RESPIRATORY:  no shortness of breath in conversation  PSYCH:  normal affect  ASSESSMENT & PLAN:    1. Migraine without status migrainosus, not intractable, unspecified migraine type  2. Mixed bipolar I disorder ([place])   Time:   Today, I have spent 7 minutes with the patient with telehealth technology discussing the above problems.     Medication Adjustments/Labs and Tests Ordered: Current medicines are reviewed at length with the patient today.  Concerns  regarding medicines are outlined above.   Tests Ordered: No orders of the defined types were placed in this encounter.   Medication Changes: No orders of the defined types were placed in this encounter.    Disposition:  Follow up March Signed, Perlie Mayo, NP  01/30/2020 10:54 AM     [place]

## 2020-02-05 ENCOUNTER — Other Ambulatory Visit: Payer: Self-pay

## 2020-02-05 ENCOUNTER — Ambulatory Visit
Admission: EM | Admit: 2020-02-05 | Discharge: 2020-02-05 | Disposition: A | Discharge status: Home / Self Care | Payer: Medicare PPO | Attending: Family Medicine | Admitting: Family Medicine

## 2020-02-05 ENCOUNTER — Encounter: Payer: Self-pay | Admitting: Emergency Medicine

## 2020-02-05 DIAGNOSIS — K648 Other hemorrhoids: Secondary | ICD-10-CM | POA: Diagnosis not present

## 2020-02-05 DIAGNOSIS — B49 Unspecified mycosis: Secondary | ICD-10-CM | POA: Diagnosis not present

## 2020-02-05 DIAGNOSIS — K6289 Other specified diseases of anus and rectum: Secondary | ICD-10-CM

## 2020-02-05 MED ORDER — CLOTRIMAZOLE-BETAMETHASONE 1-0.05 % EX CREA: TOPICAL_CREAM | CUTANEOUS | 0 refills | Status: DC

## 2020-02-05 NOTE — Discharge Instructions (Signed)
May use cream to the area twice a day as needed  Follow up with GI as needed

## 2020-02-05 NOTE — ED Triage Notes (Addendum)
Pain to rectum x 4 days ago.  Pt state she has hx of fissure. Diarrhea yesterday.

## 2020-02-05 NOTE — ED Provider Notes (Signed)
Jennifer [name]   [id number] 02/05/20 Arrival Time: 1247  CC: RASH  SUBJECTIVE:  Jennifer [name] is a 55 y.o. female who presents with rectal pain for the last 4 days.  Reports that she has history of anal fissure.  Reports that she has also been having diarrhea yesterday and today.  Reports that she is hoping that her rectum is just very irritated.  Reports that it is painful to sit down.  Reports that she tried to get in with GI today, but they are not open.  Has been using lidocaine jelly to the area.  Denies precipitating event or trauma. Denies changes in soaps, detergents, close contacts with similar rash, known trigger or environmental trigger, allergy. Denies medications change or starting a new medication recently.  Pain is worse when she is having a bowel movement.  Denies fever, chills, nausea, vomiting, erythema, swelling, discharge, oral lesions, SOB, chest pain, abdominal pain, changes in bladder function.    ROS: As per HPI.  All other pertinent ROS negative.     Past Medical History:  Diagnosis Date  . Abdominal pain, epigastric 10/07/2018  . Acute non-recurrent maxillary sinusitis 11/07/2019  . Anxiety   . Binge-eating and purging type anorexia nervosa   . Bipolar affective disorder ([place]) 12/04/2015  . Bipolar affective disorder, current episode manic without psychotic symptoms ([place]) 12/04/2015  . Bipolar disorder ([place])   . Cellulitis and abscess of buttock 02/15/2013  . Cystitis, interstitial   . Depression   . Depression    Phreesia 01/28/2020  . Esophageal polyp    about 20 years ago  . GERD (gastroesophageal reflux disease)   . Left wrist pain 03/22/2019  . OCD (obsessive compulsive disorder)   . Palpitations 05/25/2014  . Rectal pain 12/06/2018  . Rib pain on right side 01/31/2019  . Rosacea 03/27/2019  . Sprain of temporomandibular joint or ligament 01/10/2019   Past Surgical History:  Procedure Laterality Date  . BLADDER SURGERY    . CHOLECYSTECTOMY    .  COLONOSCOPY WITH PROPOFOL N/A 10/05/2019   Procedure: COLONOSCOPY WITH PROPOFOL;  Surgeon: Daneil Dolin, MD;  [facility];  Service: Endoscopy;  Laterality: N/A;  12:45pm  . COSMETIC SURGERY N/A    Phreesia 01/28/2020  . ESOPHAGOGASTRODUODENOSCOPY  09/28/2016   [facility]; Dr. Therisa Doyne; erosions in the esophagus, 5 cm hiatal hernia, nonbleeding erosive gastropathy s/p biopsied, normal duodenum.  Path with chronic inactive gastritis, no H. pylori or intestinal metaplasia.   Marland Kitchen ESOPHAGOGASTRODUODENOSCOPY (EGD) WITH PROPOFOL N/A 10/17/2018   Dr. Gala Romney: mild reflux esophagitis, small hiatal hernia  . POLYPECTOMY  10/05/2019   Procedure: POLYPECTOMY;  Surgeon: Daneil Dolin, MD;  [facility];  Service: Endoscopy;;  . VOCAL CORD LATERALIZATION, ENDOSCOPIC APPROACH W/ MLB     Allergies  Allergen Reactions  . Codeine Shortness Of Breath, Nausea Only and Rash  . Penicillins Hives, Shortness Of Breath, Swelling and Rash    Has patient had a PCN reaction causing immediate rash, facial/tongue/throat swelling, SOB or lightheadedness with hypotension: yes Has patient had a PCN reaction causing severe rash involving mucus membranes or skin necrosis: no Has patient had a PCN reaction that required hospitalization: no Has patient had a PCN reaction occurring within the last 10 years: no If all of the above answers are "NO", then may proceed with Cephalosporin use.;     . Divalproex Sodium Hives, Itching and Rash  . Meloxicam Other (See Comments)    Possible chest  tightness - instructed by MD not to take  . Sonata [Zaleplon] Other (See Comments)    Hallucinations  . Topamax [Topiramate] Other (See Comments)    Low BP and dizziness  . Aimovig [Erenumab-Aooe] Itching  . Emgality [Galcanezumab-Gnlm] Rash   No current facility-administered medications on file prior to encounter.   Current Outpatient Medications on File Prior to Encounter  Medication Sig Dispense Refill  .  acetaminophen (TYLENOL) 500 MG tablet Take 1,000 mg by mouth daily as needed for fever.     . Brexpiprazole ([name]) 4 MG TABS Take 4 mg by mouth in the morning. 30 tablet 11  . clonazePAM (KLONOPIN) 0.5 MG tablet Take 1 tablet (0.5 mg total) by mouth 2 (two) times daily as needed for anxiety. 20 tablet 5  . cycloSPORINE (RESTASIS) 0.05 % ophthalmic emulsion Place 1 drop into both eyes 2 (two) times daily.     Marland Kitchen dicyclomine (BENTYL) 20 MG tablet Take 1 tablet (20 mg total) by mouth 3 (three) times daily as needed for spasms (abdominal cramping). 20 tablet 0  . famotidine (PEPCID) 40 MG tablet Take 1 tablet (40 mg total) by mouth 2 (two) times daily. For 12 weeks, then go back to 20mg  twice daily. (Patient taking differently: Take 20 mg by mouth 2 (two) times daily.) 60 tablet 3  . fluticasone (FLONASE) 50 MCG/ACT nasal spray Place 1 spray into both nostrils daily as needed for allergies.    . fluvoxaMINE (LUVOX) 100 MG tablet Take 3 tablets (300 mg total) by mouth at bedtime. 270 tablet 1  . gabapentin (NEURONTIN) 600 MG tablet Take 2 tablets (1,200 mg total) by mouth 2 (two) times daily. 120 tablet 5  . hydrOXYzine (ATARAX/VISTARIL) 25 MG tablet Take 1 tablet (25 mg total) by mouth 3 (three) times daily as needed. 60 tablet 1  . hyoscyamine (LEVSIN) 0.125 MG tablet Take 1 tablet (0.125 mg total) by mouth every 4 (four) hours as needed (diarrhea). 120 tablet 1  . loratadine (CLARITIN) 10 MG tablet Take 5 mg by mouth daily as needed for allergies.     . naproxen sodium (ALEVE) 220 MG tablet Take 440-660 mg by mouth daily as needed (Pt takes with Nurtec as needed.).     Marland Kitchen olopatadine (PATANOL) 0.1 % ophthalmic solution Place 1 drop into both eyes daily as needed for allergies.     Marland Kitchen ondansetron (ZOFRAN-ODT) 4 MG disintegrating tablet TAKE ONE TABLET (4MG  TOTAL) BY MOUTH EVERY 8 HOURS AS NEEDED FOR NAUSEA OR VOMITING 30 tablet 1  . Rimegepant Sulfate (NURTEC) 75 MG TBDP Take 75 mg by mouth daily as  needed (take for abortive therapy of migraine, no more than 1 tablet in 24 hours or 10 per month). 10 tablet 11  . sucralfate (CARAFATE) 1 g tablet Take 1 tablet (1 g total) by mouth 3 (three) times daily as needed. 30 tablet 3  . traZODone (DESYREL) 100 MG tablet Take 1-2 tablets (100-200 mg total) by mouth at bedtime as needed for sleep. 60 tablet 11  . valACYclovir (VALTREX) 500 MG tablet Take 500 mg by mouth daily as needed (Flair up take Twice a day  for 3 days and stop).      Social History   Socioeconomic History  . Marital status: Married    Spouse name: [name]  . Number of children: 0  . Years of education: Not on file  . Highest education level: Not on file  Occupational History  . Not on file  Tobacco  Use  . Smoking status: Current Every Day Smoker    Packs/day: 1.00    Years: 20.00    Pack years: 20.00    Types: Cigarettes    Start date: 01/20/1988  . Smokeless tobacco: Never Used  Vaping Use  . Vaping Use: Never used  Substance and Sexual Activity  . Alcohol use: No    Alcohol/week: 0.0 standard drinks  . Drug use: No  . Sexual activity: Yes    Birth control/protection: None  Other Topics Concern  . 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 Difficulty of Paying Living Expenses: Not hard at all  Food Insecurity: No Food Insecurity  . Worried About Charity fundraiser in the Last Year: Never true  . Ran Out of Food in the Last Year: Never true  Transportation Needs: No Transportation Needs  . Lack of Transportation (Medical): No  .  Lack of Transportation (Non-Medical): No  Physical Activity: Inactive  . Days of Exercise per Week: 0 days  . Minutes of Exercise per Session: 0 min  Stress: No Stress Concern Present  . Feeling of Stress : Only a little  Social Connections: Moderately Isolated  . Frequency of Communication with Friends and Family: Once a week  . Frequency of Social Gatherings with Friends and Family: Once a week  . Attends Religious Services: More than 4 times per year  . Active Member of Clubs or Organizations: No  . Attends Archivist Meetings: Never  . Marital Status: Married  Human resources officer Violence: Not At Risk  . Fear of Current or Ex-Partner: No  . Emotionally Abused: No  . Physically Abused: No  . Sexually Abused: No   Family History  Problem Relation Age of Onset  . Healthy Mother   . Healthy Father   . Colon cancer Neg Hx   . Colon polyps Neg Hx     OBJECTIVE: Vitals:   02/05/20 1402  BP: 115/73  Pulse: 80  Resp: 19  Temp: 98.6 F (37 C)  TempSrc: Oral  SpO2: 98%    General appearance: alert; no distress Head: NCAT Lungs: clear to auscultation bilaterally Heart: regular rate and rhythm.  Radial pulse 2+ bilaterally Extremities: no edema Skin: warm and dry; small hemorrhoid noted to 6 o'clock position of the rectum, no anal fissures appreciated, the skin around the anus is erythematous tender, shiny, consistent with fungal infection psychological: alert and cooperative; normal mood and affect  ASSESSMENT & PLAN:  1. Fungal infection   2. Rectal pain   3. Other hemorrhoids     Meds ordered this encounter  Medications  . clotrimazole-betamethasone (LOTRISONE) cream    Sig: Apply to affected area 2 times daily prn    Dispense:  15 g    Refill:  0    Order Specific Question:   Supervising  Provider    Answer:   Chase Picket [[id number]]    Prescribed clotrimazole betamethasone Take as prescribed  Avoid hot showers/ baths Moisturize skin daily   Follow up with GI if symptoms persists Return or go to the ER if you have any new or worsening symptoms such as fever, chills, nausea, vomiting, redness, swelling, discharge, if symptoms do not improve with medications  Reviewed expectations re: course of current medical issues. Questions answered. Outlined signs and symptoms indicating need for more acute intervention. Patient verbalized understanding. After Visit Summary given.   Faustino Congress, NP 02/05/20 1704

## 2020-02-06 ENCOUNTER — Encounter: Payer: Self-pay | Admitting: Family Medicine

## 2020-02-08 ENCOUNTER — Ambulatory Visit: Payer: Medicare PPO | Admitting: Family Medicine

## 2020-02-08 ENCOUNTER — Encounter: Payer: Self-pay | Admitting: Emergency Medicine

## 2020-02-08 ENCOUNTER — Other Ambulatory Visit: Payer: Self-pay

## 2020-02-08 ENCOUNTER — Ambulatory Visit
Admission: EM | Admit: 2020-02-08 | Discharge: 2020-02-08 | Disposition: A | Discharge status: Home / Self Care | Payer: Medicare PPO | Attending: Emergency Medicine | Admitting: Emergency Medicine

## 2020-02-08 DIAGNOSIS — Z1152 Encounter for screening for COVID-19: Secondary | ICD-10-CM | POA: Diagnosis present

## 2020-02-08 DIAGNOSIS — J069 Acute upper respiratory infection, unspecified: Secondary | ICD-10-CM | POA: Insufficient documentation

## 2020-02-08 DIAGNOSIS — J029 Acute pharyngitis, unspecified: Secondary | ICD-10-CM | POA: Diagnosis not present

## 2020-02-08 LAB — POCT RAPID STREP A (OFFICE): Rapid Strep A Screen: NEGATIVE

## 2020-02-08 MED ORDER — FLUTICASONE PROPIONATE 50 MCG/ACT NA SUSP: 1.0000 | Freq: Every day | NASAL | 0 refills | Status: DC

## 2020-02-08 MED ORDER — NYSTATIN 100000 UNIT/ML MT SUSP: 5.0000 mL | Freq: Four times a day (QID) | OROMUCOSAL | 0 refills | Status: DC

## 2020-02-08 MED ORDER — BENZONATATE 100 MG PO CAPS: 100.0000 mg | ORAL_CAPSULE | Freq: Three times a day (TID) | ORAL | 0 refills | Status: DC | PRN

## 2020-02-08 NOTE — Discharge Instructions (Addendum)
COVID testing ordered.  It will take between 2-7 days for test results.  Someone will contact you regarding abnormal results.    Get plenty of rest and push fluids Tessalon Perles prescribed for cough Continue to use Mucinex flonase for nasal congestion and runny nose Nystatin was prescribed for oral thrush Use OTC throat lozenges such as Halls, Vicks or Cepacol to soothe throat Use medications daily for symptom relief Use OTC medications like ibuprofen or tylenol as needed fever or pain Call or go to the ED if you have any new or worsening symptoms such as fever, worsening cough, shortness of breath, chest tightness, chest pain, turning blue, changes in mental status, etc..Marland Kitchen

## 2020-02-08 NOTE — ED Triage Notes (Signed)
Patient states that she has sore throat,ear pain and mild cough x 2 days. wants covid test and strep

## 2020-02-08 NOTE — ED Provider Notes (Addendum)
[place]   [id number] 02/08/20 Arrival Time: [id number]   CC: COVID symptoms  SUBJECTIVE: History from: patient.  Jennifer [name] is a 55 y.o. female who presented to the urgent care with a complaint of sore throat, ear pain and cough for the past 2 days.  Denies sick exposure to COVID, flu or strep.  Denies recent travel.  Has tried OTC medication without relief.  Denies alleviating or aggravating factors.  Denies previous symptoms in the past.   Denies fever, chills, fatigue, sinus pain, rhinorrhea, SOB, wheezing, chest pain, nausea, changes in bowel or bladder habits.     ROS: As per HPI.  All other pertinent ROS negative.      Past Medical History:  Diagnosis Date  . Abdominal pain, epigastric 10/07/2018  . Acute non-recurrent maxillary sinusitis 11/07/2019  . Anxiety   . Binge-eating and purging type anorexia nervosa   . Bipolar affective disorder ([place]) 12/04/2015  . Bipolar affective disorder, current episode manic without psychotic symptoms ([place]) 12/04/2015  . Bipolar disorder ([place])   . Cellulitis and abscess of buttock 02/15/2013  . Cystitis, interstitial   . Depression   . Depression    Phreesia 01/28/2020  . Esophageal polyp    about 20 years ago  . GERD (gastroesophageal reflux disease)   . Left wrist pain 03/22/2019  . OCD (obsessive compulsive disorder)   . Palpitations 05/25/2014  . Rectal pain 12/06/2018  . Rib pain on right side 01/31/2019  . Rosacea 03/27/2019  . Sprain of temporomandibular joint or ligament 01/10/2019   Past Surgical History:  Procedure Laterality Date  . BLADDER SURGERY    . CHOLECYSTECTOMY    . COLONOSCOPY WITH PROPOFOL N/A 10/05/2019   Procedure: COLONOSCOPY WITH PROPOFOL;  Surgeon: Daneil Dolin, MD;  [facility];  Service: Endoscopy;  Laterality: N/A;  12:45pm  . COSMETIC SURGERY N/A    Phreesia 01/28/2020  . ESOPHAGOGASTRODUODENOSCOPY  09/28/2016   [facility]; Dr. Therisa Doyne; erosions in the esophagus, 5 cm hiatal hernia,  nonbleeding erosive gastropathy s/p biopsied, normal duodenum.  Path with chronic inactive gastritis, no H. pylori or intestinal metaplasia.   Marland Kitchen ESOPHAGOGASTRODUODENOSCOPY (EGD) WITH PROPOFOL N/A 10/17/2018   Dr. Gala Romney: mild reflux esophagitis, small hiatal hernia  . POLYPECTOMY  10/05/2019   Procedure: POLYPECTOMY;  Surgeon: Daneil Dolin, MD;  [facility];  Service: Endoscopy;;  . VOCAL CORD LATERALIZATION, ENDOSCOPIC APPROACH W/ MLB     Allergies  Allergen Reactions  . Codeine Shortness Of Breath, Nausea Only and Rash  . Penicillins Hives, Shortness Of Breath, Swelling and Rash    Has patient had a PCN reaction causing immediate rash, facial/tongue/throat swelling, SOB or lightheadedness with hypotension: yes Has patient had a PCN reaction causing severe rash involving mucus membranes or skin necrosis: no Has patient had a PCN reaction that required hospitalization: no Has patient had a PCN reaction occurring within the last 10 years: no If all of the above answers are "NO", then may proceed with Cephalosporin use.;     . Divalproex Sodium Hives, Itching and Rash  . Meloxicam Other (See Comments)    Possible chest tightness - instructed by MD not to take  . Sonata [Zaleplon] Other (See Comments)    Hallucinations  . Topamax [Topiramate] Other (See Comments)    Low BP and dizziness  . Aimovig [Erenumab-Aooe] Itching  . Emgality [Galcanezumab-Gnlm] Rash   No current facility-administered medications on file prior to encounter.   Current Outpatient Medications  on File Prior to Encounter  Medication Sig Dispense Refill  . acetaminophen (TYLENOL) 500 MG tablet Take 1,000 mg by mouth daily as needed for fever.     . Brexpiprazole ([name]) 4 MG TABS Take 4 mg by mouth in the morning. 30 tablet 11  . clonazePAM (KLONOPIN) 0.5 MG tablet Take 1 tablet (0.5 mg total) by mouth 2 (two) times daily as needed for anxiety. 20 tablet 5  . clotrimazole-betamethasone (LOTRISONE)  cream Apply to affected area 2 times daily prn 15 g 0  . cycloSPORINE (RESTASIS) 0.05 % ophthalmic emulsion Place 1 drop into both eyes 2 (two) times daily.     Marland Kitchen dicyclomine (BENTYL) 20 MG tablet Take 1 tablet (20 mg total) by mouth 3 (three) times daily as needed for spasms (abdominal cramping). 20 tablet 0  . famotidine (PEPCID) 40 MG tablet Take 1 tablet (40 mg total) by mouth 2 (two) times daily. For 12 weeks, then go back to 20mg  twice daily. (Patient taking differently: Take 20 mg by mouth 2 (two) times daily.) 60 tablet 3  . fluvoxaMINE (LUVOX) 100 MG tablet Take 3 tablets (300 mg total) by mouth at bedtime. 270 tablet 1  . gabapentin (NEURONTIN) 600 MG tablet Take 2 tablets (1,200 mg total) by mouth 2 (two) times daily. 120 tablet 5  . hydrOXYzine (ATARAX/VISTARIL) 25 MG tablet Take 1 tablet (25 mg total) by mouth 3 (three) times daily as needed. 60 tablet 1  . hyoscyamine (LEVSIN) 0.125 MG tablet Take 1 tablet (0.125 mg total) by mouth every 4 (four) hours as needed (diarrhea). 120 tablet 1  . loratadine (CLARITIN) 10 MG tablet Take 5 mg by mouth daily as needed for allergies.     . naproxen sodium (ALEVE) 220 MG tablet Take 440-660 mg by mouth daily as needed (Pt takes with Nurtec as needed.).     Marland Kitchen olopatadine (PATANOL) 0.1 % ophthalmic solution Place 1 drop into both eyes daily as needed for allergies.     Marland Kitchen ondansetron (ZOFRAN-ODT) 4 MG disintegrating tablet TAKE ONE TABLET (4MG  TOTAL) BY MOUTH EVERY 8 HOURS AS NEEDED FOR NAUSEA OR VOMITING 30 tablet 1  . Rimegepant Sulfate (NURTEC) 75 MG TBDP Take 75 mg by mouth daily as needed (take for abortive therapy of migraine, no more than 1 tablet in 24 hours or 10 per month). 10 tablet 11  . sucralfate (CARAFATE) 1 g tablet Take 1 tablet (1 g total) by mouth 3 (three) times daily as needed. 30 tablet 3  . traZODone (DESYREL) 100 MG tablet Take 1-2 tablets (100-200 mg total) by mouth at bedtime as needed for sleep. 60 tablet 11  . valACYclovir  (VALTREX) 500 MG tablet Take 500 mg by mouth daily as needed (Flair up take Twice a day  for 3 days and stop).      Social History   Socioeconomic History  . Marital status: Married    Spouse name: [name]  . Number of children: 0  . Years of education: Not on file  . Highest education level: Not on file  Occupational History  . Not on file  Tobacco Use  . Smoking status: Current Every Day Smoker    Packs/day: 1.00    Years: 20.00    Pack years: 20.00    Types: Cigarettes    Start date: 01/20/1988  . Smokeless tobacco: Never Used  Vaping Use  . Vaping Use: Never used  Substance and Sexual Activity  . Alcohol use: No  Alcohol/week: 0.0 standard drinks  . Drug use: No  . Sexual activity: Yes    Birth control/protection: None  Other Topics Concern  . 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 Difficulty of Paying Living Expenses: Not hard at all  Food Insecurity: No Food Insecurity  . Worried About Charity fundraiser in the Last Year: Never true  . Ran Out of Food in the Last Year: Never true  Transportation Needs: No Transportation Needs  . Lack of Transportation (Medical): No  . Lack of Transportation (Non-Medical): No  Physical Activity: Inactive  . Days of Exercise per Week: 0 days  . Minutes of Exercise per Session: 0 min  Stress: No Stress Concern Present  . Feeling of Stress : Only a little  Social Connections: Moderately Isolated  . Frequency of Communication with Friends and Family: Once a week  . Frequency of Social Gatherings with  Friends and Family: Once a week  . Attends Religious Services: More than 4 times per year  . Active Member of Clubs or Organizations: No  . Attends Archivist Meetings: Never  . Marital Status: Married  Human resources officer Violence: Not At Risk  . Fear of Current or Ex-Partner: No  . Emotionally Abused: No  . Physically Abused: No  . Sexually Abused: No   Family History  Problem Relation Age of Onset  . Healthy Mother   . Healthy Father   . Colon cancer Neg Hx   . Colon polyps Neg Hx     OBJECTIVE:  Vitals:   02/08/20 0811  BP: 123/82  Pulse: 82  Resp: 15  Temp: 98.2 F (36.8 C)  SpO2: 97%     Physical Exam Vitals and nursing note reviewed.  Constitutional:      General: She is not in acute distress.    Appearance: Normal appearance. She is normal weight. She is not ill-appearing, toxic-appearing or diaphoretic.  HENT:     Head: Normocephalic.     Right Ear: Tympanic membrane, ear canal and external ear normal. There is no impacted cerumen.     Left Ear: Tympanic membrane, ear canal and external ear normal. There is no impacted cerumen.     Mouth/Throat:     Lips: Pink.     Mouth: Mucous membranes are moist.     Pharynx: No pharyngeal swelling.     Tonsils: No tonsillar exudate. 1+ on the right. 1+ on the left.     Comments: Tongue with white coating  Cardiovascular:     Rate and Rhythm: Normal rate and regular rhythm.     Pulses: Normal pulses.     Heart sounds: Normal heart sounds. No murmur heard. No friction rub. No gallop.   Pulmonary:     Effort: Pulmonary effort is normal. No respiratory distress.     Breath sounds:  Normal breath sounds. No stridor. No wheezing, rhonchi or rales.  Chest:     Chest wall: No tenderness.  Neurological:     Mental Status: She is alert and oriented to person, place, and time.     LABS:  Results for orders placed or performed during the hospital encounter of 02/08/20 (from the past 24 hour(s))  POCT rapid strep  A     Status: None   Collection Time: 02/08/20  8:41 AM  Result Value Ref Range   Rapid Strep A Screen Negative Negative     ASSESSMENT & PLAN:  1. Sore throat   2. Encounter for screening for COVID-19   3. Viral URI with cough     Meds ordered this encounter  Medications  . benzonatate (TESSALON) 100 MG capsule    Sig: Take 1 capsule (100 mg total) by mouth 3 (three) times daily as needed for cough.    Dispense:  30 capsule    Refill:  0  . nystatin (MYCOSTATIN) 100000 UNIT/ML suspension    Sig: Take 5 mLs (500,000 Units total) by mouth 4 (four) times daily.    Dispense:  60 mL    Refill:  0  . fluticasone (FLONASE) 50 MCG/ACT nasal spray    Sig: Place 1 spray into both nostrils daily for 14 days.    Dispense:  16 g    Refill:  0    Discharge instructions  COVID testing ordered.  It will take between 2-7 days for test results.  Someone will contact you regarding abnormal results.    Get plenty of rest and push fluids Tessalon Perles prescribed for cough Continue to use Mucinex flonase for nasal congestion and runny nose Nystatin was prescribed for oral thrush Use OTC throat lozenges such as Halls, Vicks or Cepacol to soothe throat Use medications daily for symptom relief Use OTC medications like ibuprofen or tylenol as needed fever or pain Call or go to the ED if you have any new or worsening symptoms such as fever, worsening cough, shortness of breath, chest tightness, chest pain, turning blue, changes in mental status, etc...   Reviewed expectations re: course of current medical issues. Questions answered. Outlined signs and symptoms indicating need for more acute intervention. Patient verbalized understanding. After Visit Summary given.         Emerson Monte, FNP 02/08/20 [place], [place], [place] 02/08/20 [phone]

## 2020-02-10 LAB — NOVEL CORONAVIRUS, NAA: SARS-CoV-2, NAA: NOT DETECTED

## 2020-02-10 LAB — SARS-COV-2, NAA 2 DAY TAT

## 2020-02-11 LAB — CULTURE, GROUP A STREP (THRC)

## 2020-02-15 ENCOUNTER — Encounter: Payer: Self-pay | Admitting: Internal Medicine

## 2020-02-15 ENCOUNTER — Telehealth (INDEPENDENT_AMBULATORY_CARE_PROVIDER_SITE_OTHER): Payer: Medicare PPO | Admitting: Internal Medicine

## 2020-02-15 ENCOUNTER — Other Ambulatory Visit: Payer: Self-pay

## 2020-02-15 DIAGNOSIS — J0110 Acute frontal sinusitis, unspecified: Secondary | ICD-10-CM

## 2020-02-15 DIAGNOSIS — J40 Bronchitis, not specified as acute or chronic: Secondary | ICD-10-CM

## 2020-02-15 MED ORDER — ALBUTEROL SULFATE HFA 108 (90 BASE) MCG/ACT IN AERS: 2.0000 | INHALATION_SPRAY | Freq: Four times a day (QID) | RESPIRATORY_TRACT | 0 refills | Status: DC | PRN

## 2020-02-15 MED ORDER — AZITHROMYCIN 250 MG PO TABS: ORAL_TABLET | ORAL | 0 refills | Status: DC

## 2020-02-15 MED ORDER — SALINE NASAL SPRAY 0.65 % NA SOLN: 1.0000 | NASAL | 12 refills | Status: DC | PRN

## 2020-02-15 NOTE — Patient Instructions (Signed)

## 2020-02-15 NOTE — Progress Notes (Signed)
Virtual Visit via Telephone Note   This visit type was conducted due to national recommendations for restrictions regarding the COVID-19 Pandemic (e.g. social distancing) in an effort to limit this patient's exposure and mitigate transmission in our community.  Due to her co-morbid illnesses, this patient is at least at moderate risk for complications without adequate follow up.  This format is felt to be most appropriate for this patient at this time.  The patient did not have access to video technology/had technical difficulties with video requiring transitioning to audio format only (telephone).  All issues noted in this document were discussed and addressed.  No physical exam could be performed with this format.   Evaluation Performed:  Follow-up visit  Date:  02/15/2020   ID:  Katilyn, Miltenberger 10/21/1965, MRN [id number]  Patient Location: Home Provider Location: Office/Clinic  Location of Patient: Home Location of Provider: Telehealth Consent was obtain for visit to be over via telehealth. I verified that I am speaking with the correct person using two identifiers.  PCP:  Perlie Mayo, NP   Chief Complaint:  Cough and frontal headache  History of Present Illness:    NALINI ALCARAZ is a 55 y.o. female with PMH of migraine, GERD, OCD and bipolar disorder who has a televisit for c/o cough, nasal congestion and headache for about 10 days. She had home COVID test and RTPCR at Urgent care for COVID, which were negative. She was prescribed Tessalon and Flonase from Urgent care, but is still having headache and cough. She has tried Mucinex, which made her have palpitations. She denies any fever, chills, or change in taste or smell sensation. She denies any recent sick contacts. She has been trying to avoid smoking since her symptoms started. She mentions having mild dyspnea intermittently, but denies any chest pain currently.  The patient does not have symptoms concerning for  COVID-19 infection (fever, chills, cough, or new shortness of breath).   Past Medical, Surgical, Social History, Allergies, and Medications have been Reviewed.  Past Medical History:  Diagnosis Date  . Abdominal pain, epigastric 10/07/2018  . Acute non-recurrent maxillary sinusitis 11/07/2019  . Anxiety   . Binge-eating and purging type anorexia nervosa   . Bipolar affective disorder ([place]) 12/04/2015  . Bipolar affective disorder, current episode manic without psychotic symptoms ([place]) 12/04/2015  . Bipolar disorder ([place])   . Cellulitis and abscess of buttock 02/15/2013  . Cystitis, interstitial   . Depression   . Depression    Phreesia 01/28/2020  . Esophageal polyp    about 20 years ago  . GERD (gastroesophageal reflux disease)   . Left wrist pain 03/22/2019  . OCD (obsessive compulsive disorder)   . Palpitations 05/25/2014  . Rectal pain 12/06/2018  . Rib pain on right side 01/31/2019  . Rosacea 03/27/2019  . Sprain of temporomandibular joint or ligament 01/10/2019   Past Surgical History:  Procedure Laterality Date  . BLADDER SURGERY    . CHOLECYSTECTOMY    . COLONOSCOPY WITH PROPOFOL N/A 10/05/2019   Procedure: COLONOSCOPY WITH PROPOFOL;  Surgeon: Daneil Dolin, MD;  [facility];  Service: Endoscopy;  Laterality: N/A;  12:45pm  . COSMETIC SURGERY N/A    Phreesia 01/28/2020  . ESOPHAGOGASTRODUODENOSCOPY  09/28/2016   [facility]; Dr. Therisa Doyne; erosions in the esophagus, 5 cm hiatal hernia, nonbleeding erosive gastropathy s/p biopsied, normal duodenum.  Path with chronic inactive gastritis, no H. pylori or intestinal metaplasia.   Marland Kitchen ESOPHAGOGASTRODUODENOSCOPY (EGD)  WITH PROPOFOL N/A 10/17/2018   Dr. Gala Romney: mild reflux esophagitis, small hiatal hernia  . POLYPECTOMY  10/05/2019   Procedure: POLYPECTOMY;  Surgeon: Daneil Dolin, MD;  Location: [facility];  Service: Endoscopy;;  . VOCAL CORD LATERALIZATION, ENDOSCOPIC APPROACH W/ MLB       Current Meds  Medication Sig   . acetaminophen (TYLENOL) 500 MG tablet Take 1,000 mg by mouth daily as needed for fever.   Marland Kitchen albuterol (VENTOLIN HFA) 108 (90 Base) MCG/ACT inhaler Inhale 2 puffs into the lungs every 6 (six) hours as needed for wheezing or shortness of breath.  Marland Kitchen azithromycin (ZITHROMAX) 250 MG tablet Take as package instructions.  . benzonatate (TESSALON) 100 MG capsule Take 1 capsule (100 mg total) by mouth 3 (three) times daily as needed for cough.  . Brexpiprazole ([name]) 4 MG TABS Take 4 mg by mouth in the morning.  . clonazePAM (KLONOPIN) 0.5 MG tablet Take 1 tablet (0.5 mg total) by mouth 2 (two) times daily as needed for anxiety.  . clotrimazole-betamethasone (LOTRISONE) cream Apply to affected area 2 times daily prn  . cycloSPORINE (RESTASIS) 0.05 % ophthalmic emulsion Place 1 drop into both eyes 2 (two) times daily.   Marland Kitchen dicyclomine (BENTYL) 20 MG tablet Take 1 tablet (20 mg total) by mouth 3 (three) times daily as needed for spasms (abdominal cramping).  . famotidine (PEPCID) 40 MG tablet Take 1 tablet (40 mg total) by mouth 2 (two) times daily. For 12 weeks, then go back to 20mg  twice daily. (Patient taking differently: Take 20 mg by mouth 2 (two) times daily.)  . fluticasone (FLONASE) 50 MCG/ACT nasal spray Place 1 spray into both nostrils daily for 14 days.  . fluvoxaMINE (LUVOX) 100 MG tablet Take 3 tablets (300 mg total) by mouth at bedtime.  . gabapentin (NEURONTIN) 600 MG tablet Take 2 tablets (1,200 mg total) by mouth 2 (two) times daily.  . hydrOXYzine (ATARAX/VISTARIL) 25 MG tablet Take 1 tablet (25 mg total) by mouth 3 (three) times daily as needed.  . hyoscyamine (LEVSIN) 0.125 MG tablet Take 1 tablet (0.125 mg total) by mouth every 4 (four) hours as needed (diarrhea).  . loratadine (CLARITIN) 10 MG tablet Take 5 mg by mouth daily as needed for allergies.   . naproxen sodium (ALEVE) 220 MG tablet Take 440-660 mg by mouth daily as needed (Pt takes with Nurtec as needed.).   Marland Kitchen nystatin  (MYCOSTATIN) 100000 UNIT/ML suspension Take 5 mLs (500,000 Units total) by mouth 4 (four) times daily.  Marland Kitchen olopatadine (PATANOL) 0.1 % ophthalmic solution Place 1 drop into both eyes daily as needed for allergies.   Marland Kitchen ondansetron (ZOFRAN-ODT) 4 MG disintegrating tablet TAKE ONE TABLET (4MG  TOTAL) BY MOUTH EVERY 8 HOURS AS NEEDED FOR NAUSEA OR VOMITING  . Rimegepant Sulfate (NURTEC) 75 MG TBDP Take 75 mg by mouth daily as needed (take for abortive therapy of migraine, no more than 1 tablet in 24 hours or 10 per month).  . sodium chloride (OCEAN NASAL SPRAY) 0.65 % nasal spray Place 1 spray into the nose as needed for congestion.  . sucralfate (CARAFATE) 1 g tablet Take 1 tablet (1 g total) by mouth 3 (three) times daily as needed.  . traZODone (DESYREL) 100 MG tablet Take 1-2 tablets (100-200 mg total) by mouth at bedtime as needed for sleep.  . valACYclovir (VALTREX) 500 MG tablet Take 500 mg by mouth daily as needed (Flair up take Twice a day  for 3 days and stop).  Allergies:   Codeine, Penicillins, Divalproex sodium, Meloxicam, Sonata [zaleplon], Topamax [topiramate], Aimovig [erenumab-aooe], and Galcanezumab-gnlm   ROS:   Please see the history of present illness.     All other systems reviewed and are negative.   Labs/Other Tests and Data Reviewed:    Recent Labs: 03/20/2019: ALT 22; BUN 16; Creatinine, Ser 0.83; Hemoglobin 13.4; Platelets 187; Potassium 3.4; Sodium 135   Recent Lipid Panel Lab Results  Component Value Date/Time   CHOL 170 12/06/2015 06:09 AM   TRIG 95 12/06/2015 06:09 AM   HDL 61 12/06/2015 06:09 AM   CHOLHDL 2.8 12/06/2015 06:09 AM   LDLCALC 90 12/06/2015 06:09 AM    Wt Readings from Last 3 Encounters:  01/30/20 158 lb (71.7 kg)  12/07/19 168 lb (76.2 kg)  11/07/19 150 lb (68 kg)      ASSESSMENT & PLAN:    Acute sinusitis Azithromycin prescribed [place] spray for nasal congestion Tessalon PRN for cough Tylenol for fever/myalgias  Acute  bronchitis Has h/o smoking C/o mild dyspnea Albuterol PRN prescribed Avoided steroids due to h/o peptic ulcer If persistent symptoms, will prescribed short course of steroids with PPI  Time:   Today, I have spent 15 minutes reviewing the chart, including problem list, medications, and with the patient with telehealth technology discussing the above problems.   Medication Adjustments/Labs and Tests Ordered: Current medicines are reviewed at length with the patient today.  Concerns regarding medicines are outlined above.   Tests Ordered: No orders of the defined types were placed in this encounter.   Medication Changes: Meds ordered this encounter  Medications  . azithromycin (ZITHROMAX) 250 MG tablet    Sig: Take as package instructions.    Dispense:  6 tablet    Refill:  0  . sodium chloride (OCEAN NASAL SPRAY) 0.65 % nasal spray    Sig: Place 1 spray into the nose as needed for congestion.    Dispense:  30 mL    Refill:  12  . albuterol (VENTOLIN HFA) 108 (90 Base) MCG/ACT inhaler    Sig: Inhale 2 puffs into the lungs every 6 (six) hours as needed for wheezing or shortness of breath.    Dispense:  8 g    Refill:  0     Note: This dictation was prepared with Dragon dictation along with smaller phrase technology. Similar sounding words can be transcribed inadequately or may not be corrected upon review. Any transcriptional errors that result from this process are unintentional.      Disposition:  Follow up  Signed, Lindell Spar, MD  02/15/2020 4:37 PM     [place]

## 2020-02-27 ENCOUNTER — Other Ambulatory Visit: Payer: Self-pay | Admitting: Plastic Surgery

## 2020-03-07 ENCOUNTER — Encounter: Payer: Self-pay | Admitting: Physician Assistant

## 2020-03-07 ENCOUNTER — Telehealth (INDEPENDENT_AMBULATORY_CARE_PROVIDER_SITE_OTHER): Payer: Medicare PPO | Admitting: Physician Assistant

## 2020-03-07 DIAGNOSIS — G4700 Insomnia, unspecified: Secondary | ICD-10-CM

## 2020-03-07 DIAGNOSIS — F509 Eating disorder, unspecified: Secondary | ICD-10-CM | POA: Diagnosis not present

## 2020-03-07 DIAGNOSIS — Z638 Other specified problems related to primary support group: Secondary | ICD-10-CM

## 2020-03-07 DIAGNOSIS — F429 Obsessive-compulsive disorder, unspecified: Secondary | ICD-10-CM

## 2020-03-07 DIAGNOSIS — F319 Bipolar disorder, unspecified: Secondary | ICD-10-CM

## 2020-03-07 DIAGNOSIS — F411 Generalized anxiety disorder: Secondary | ICD-10-CM | POA: Diagnosis not present

## 2020-03-07 NOTE — Progress Notes (Signed)
[facility] Check  Patient ID: Jennifer [name],  MRN: [id number]  PCP: Perlie Mayo, NP  Date of Evaluation: 03/07/2020 Time spent:30 minutes  Chief Complaint:  Chief Complaint    Anxiety; Depression; Insomnia; Follow-up     Virtual Visit via Telehealth  I connected with patient by a video enabled telemedicine application with their informed consent, and verified patient privacy and that I am speaking with the correct person using two identifiers.  I am private, in my office and the patient is at home.  I discussed the limitations, risks, security and privacy concerns of performing an evaluation and management service by video and the availability of in person appointments. I also discussed with the patient that there may be a patient responsible charge related to this service. The patient expressed understanding and agreed to proceed.   I discussed the assessment and treatment plan with the patient. The patient was provided an opportunity to ask questions and all were answered. The patient agreed with the plan and demonstrated an understanding of the instructions.   The patient was advised to call back or seek an in-person evaluation if the symptoms worsen or if the condition fails to improve as anticipated.  I provided 30 minutes of non-face-to-face time during this encounter.   HISTORY/CURRENT STATUS: HPI For routine med check.  States she is doing well. There is family tension between her and her mom, but not as bad as it has been. Alynah is able to enjoy things. Energy and motivation are better. Appetite is the same and weight is stable. She does continue to binge and purge and is working on the eating disorder with her counselor [facility] Records. Teola feels that she is purging less than she has in the past. Not isolating. Denies suicidal or homicidal thoughts.  He is sleeping pretty well with having trazodone. She does have anxiety still if triggered, for example with her  mom and like that. She does take the Klonopin but not often. It is helpful when needed.  Patient denies increased energy with decreased need for sleep, no increased talkativeness, no racing thoughts, no impulsivity or risky behaviors, no increased spending, no increased libido, no grandiosity,  No hallucinations or paranoia.  Denies dizziness, syncope, seizures, numbness, tingling, tremor, tics, unsteady gait, slurred speech, confusion. Denies muscle or joint pain, stiffness, or dystonia.Denies unexplained weight loss, frequent infections, or sores that heal slowly.  No polyphagia, polydipsia, or polyuria. Denies visual changes or paresthesias.   Individual Medical History/ Review of Systems: Changes? :Yes  Since her last appointment 6 weeks ago she had two ER visits. One on 02/08/2020 and on 02/05/2020. Records were reviewed.  Past medications for mental health diagnoses include: Risperdal, Seroquel, Prozac, Zoloft, Wellbutrin, Lamictal, Depakote caused hair loss,Xanax, Ambien, trazodone, Trileptal, Luvox, Topamax, Elavil, Pamelor, BuSpar, doxazosin, prazosin, lithium, Vraylar, Abilify, [name], Latuda >60 mg caused abd pain and nausea  Allergies: Codeine, Penicillins, Divalproex sodium, Meloxicam, Sonata [zaleplon], Topamax [topiramate], Aimovig [erenumab-aooe], and Galcanezumab-gnlm  Current Medications:  Current Outpatient Medications:  .  acetaminophen (TYLENOL) 500 MG tablet, Take 1,000 mg by mouth daily as needed for fever. , Disp: , Rfl:  .  albuterol (VENTOLIN HFA) 108 (90 Base) MCG/ACT inhaler, Inhale 2 puffs into the lungs every 6 (six) hours as needed for wheezing or shortness of breath., Disp: 8 g, Rfl: 0 .  benzonatate (TESSALON) 100 MG capsule, Take 1 capsule (100 mg total) by mouth 3 (three) times daily as needed for cough., Disp: 30 capsule,  Rfl: 0 .  Brexpiprazole ([name]) 4 MG TABS, Take 4 mg by mouth in the morning., Disp: 30 tablet, Rfl: 11 .  clonazePAM (KLONOPIN) 0.5 MG  tablet, Take 1 tablet (0.5 mg total) by mouth 2 (two) times daily as needed for anxiety., Disp: 20 tablet, Rfl: 5 .  clotrimazole-betamethasone (LOTRISONE) cream, Apply to affected area 2 times daily prn, Disp: 15 g, Rfl: 0 .  cycloSPORINE (RESTASIS) 0.05 % ophthalmic emulsion, Place 1 drop into both eyes 2 (two) times daily. , Disp: , Rfl:  .  famotidine (PEPCID) 40 MG tablet, Take 1 tablet (40 mg total) by mouth 2 (two) times daily. For 12 weeks, then go back to 20mg  twice daily. (Patient taking differently: Take 20 mg by mouth 2 (two) times daily.), Disp: 60 tablet, Rfl: 3 .  fluvoxaMINE (LUVOX) 100 MG tablet, Take 3 tablets (300 mg total) by mouth at bedtime., Disp: 270 tablet, Rfl: 1 .  gabapentin (NEURONTIN) 600 MG tablet, Take 2 tablets (1,200 mg total) by mouth 2 (two) times daily., Disp: 120 tablet, Rfl: 5 .  hydrOXYzine (ATARAX/VISTARIL) 25 MG tablet, Take 1 tablet (25 mg total) by mouth 3 (three) times daily as needed., Disp: 60 tablet, Rfl: 1 .  loratadine (CLARITIN) 10 MG tablet, Take 5 mg by mouth daily as needed for allergies. , Disp: , Rfl:  .  naproxen sodium (ALEVE) 220 MG tablet, Take 440-660 mg by mouth daily as needed (Pt takes with Nurtec as needed.). , Disp: , Rfl:  .  olopatadine (PATANOL) 0.1 % ophthalmic solution, Place 1 drop into both eyes daily as needed for allergies. , Disp: , Rfl:  .  ondansetron (ZOFRAN-ODT) 4 MG disintegrating tablet, TAKE ONE TABLET (4MG  TOTAL) BY MOUTH EVERY 8 HOURS AS NEEDED FOR NAUSEA OR VOMITING, Disp: 30 tablet, Rfl: 1 .  Rimegepant Sulfate (NURTEC) 75 MG TBDP, Take 75 mg by mouth daily as needed (take for abortive therapy of migraine, no more than 1 tablet in 24 hours or 10 per month)., Disp: 10 tablet, Rfl: 11 .  sodium chloride (OCEAN NASAL SPRAY) 0.65 % nasal spray, Place 1 spray into the nose as needed for congestion., Disp: 30 mL, Rfl: 12 .  sucralfate (CARAFATE) 1 g tablet, Take 1 tablet (1 g total) by mouth 3 (three) times daily as  needed., Disp: 30 tablet, Rfl: 3 .  traZODone (DESYREL) 100 MG tablet, Take 1-2 tablets (100-200 mg total) by mouth at bedtime as needed for sleep., Disp: 60 tablet, Rfl: 11 .  valACYclovir (VALTREX) 500 MG tablet, Take 500 mg by mouth daily as needed (Flair up take Twice a day  for 3 days and stop). , Disp: , Rfl:  .  azithromycin (ZITHROMAX) 250 MG tablet, Take as package instructions., Disp: 6 tablet, Rfl: 0 .  dicyclomine (BENTYL) 20 MG tablet, Take 1 tablet (20 mg total) by mouth 3 (three) times daily as needed for spasms (abdominal cramping). (Patient not taking: Reported on 03/07/2020), Disp: 20 tablet, Rfl: 0 .  fluticasone (FLONASE) 50 MCG/ACT nasal spray, Place 1 spray into both nostrils daily for 14 days., Disp: 16 g, Rfl: 0 .  hyoscyamine (LEVSIN) 0.125 MG tablet, Take 1 tablet (0.125 mg total) by mouth every 4 (four) hours as needed (diarrhea). (Patient not taking: Reported on 03/07/2020), Disp: 120 tablet, Rfl: 1 Medication Side Effects: none  Family Medical/ Social History: Changes? No  MENTAL HEALTH EXAM:  Last menstrual period 10/07/2011.There is no height or weight on file to calculate BMI.  General Appearance: Casual, Neat and Well Groomed  Eye Contact:  Good  Speech:  Clear and Coherent and Normal Rate  Volume:  Normal  Mood:  Euthymic  Affect:  Appropriate  Thought Process:  Goal Directed and Descriptions of Associations: Intact  Orientation:  Full (Time, Place, and Person)  Thought Content: Logical   Suicidal Thoughts:  No  Homicidal Thoughts:  No  Memory:  WNL  Judgement:  Good  Insight:  Good  Psychomotor Activity:  Appears normal from the shoulders up   Concentration:  Concentration: Good  Recall:  Good  Fund of Knowledge: Good  Language: Good  Assets:  Desire for Improvement  ADL's:  Intact  Cognition: WNL  Prognosis:  Good    DIAGNOSES:    ICD-10-CM   1. Obsessive-compulsive disorder, unspecified type  F42.9   2. Bipolar I disorder ([place])  F31.9   3.  Eating disorder, unspecified type  F50.9   4. Generalized anxiety disorder  F41.1   5. Insomnia, unspecified type  G47.00   6. Stress due to family tension  Z63.8     Receiving Psychotherapy: Yes   [facility]ica Records, every week               RECOMMENDATIONS:  PDMP reviewed.  I provided 30 minutes of nonface-to-face time during this encounter, in which we discussed her response to med and therapy.  She is doing great! And has been for at least 2-3 months now. Able to cope with things better, through techniques she's learned in counseling. Medication changes are not necessary. Time was also spent in reviewing notes and labs from her ER visits in January. Conda requests that I reach out to Dr. Rica Records just to let her know how she is doing from my standpoint and give her more information about Jacklynn's history if needed. Continue [name] 4 mg, 1 p.o. daily. Continue Klonopin 0.5 mg, 1 p.o. twice daily as needed.   Continue Luvox 100 mg, 3 p.o. nightly. Continue gabapentin 600 mg, 2 p.o. twice daily. Continue hydroxyzine 25 mg, 1/2-1 p.o. nightly 8H as needed anxiety.  Continue trazodone 100 mg, 1-2 nightly as needed sleep. Routine labs are due within a month or so. If her PCP does not do them, I will order next time. Continue therapy. Return in 2-3 months.  [name], PA-C

## 2020-03-14 ENCOUNTER — Telehealth (INDEPENDENT_AMBULATORY_CARE_PROVIDER_SITE_OTHER): Payer: Medicare PPO | Admitting: Family Medicine

## 2020-03-14 ENCOUNTER — Encounter: Payer: Self-pay | Admitting: Family Medicine

## 2020-03-14 ENCOUNTER — Other Ambulatory Visit: Payer: Self-pay

## 2020-03-14 DIAGNOSIS — M542 Cervicalgia: Secondary | ICD-10-CM | POA: Diagnosis not present

## 2020-03-14 NOTE — Progress Notes (Signed)
Virtual Visit via Telephone Note  I connected with Jennifer [name] on 03/14/20 at  2:20 PM EST by telephone and verified that I am speaking with the correct person using two identifiers.  Location: Patient: home Provider: office   I discussed the limitations, risks, security and privacy concerns of performing an evaluation and management service by telephone and the availability of in person appointments. I also discussed with the patient that there may be a patient responsible charge related to this service. The patient expressed understanding and agreed to proceed.   History of Present Illness: 1 week h/o intermitent right sided neck pain, between 1 to 3 episodes per day, when it occurs its a 10 because it Is scary, it is sharp less than 10 sec occurs with turning her head. No radiation to upper extremities, no new weakness or numbness. No recent direct or indirect neck trauma to trigger the pain   Observations/Objective: Ht 5\' 4"  (1.626 m)   Wt 162 lb (73.5 kg)   LMP 10/07/2011   BMI 27.81 kg/m  Good communication with no confusion and intact memory. Alert and oriented x 3 No signs of respiratory distress during speech    Assessment and Plan:  Neck pain on right side Acute neck pain , likely arthritic in nature, advised topical massage / rub, and will obtain X ray of Cspine   Follow Up Instructions:    I discussed the assessment and treatment plan with the patient. The patient was provided an opportunity to ask questions and all were answered. The patient agreed with the plan and demonstrated an understanding of the instructions.   The patient was advised to call back or seek an in-person evaluation if the symptoms worsen or if the condition fails to improve as anticipated.  I provided 8 minutes of non-face-to-face time during this encounter.   Tula Nakayama, MD

## 2020-03-14 NOTE — Patient Instructions (Signed)
F/U in 6 months, call if you need [place] sooner  I believe that the pain in your neck is from arthritis.  Please get an X ray of your neck today, we will contact you with the result  Use diclofenac  Gel which you already have, twice daily to right neck for 5 days then , as needed  Thanks for choosing [facility], we consider it a privelige to serve you.

## 2020-03-15 ENCOUNTER — Ambulatory Visit (HOSPITAL_COMMUNITY)
Admission: RE | Admit: 2020-03-15 | Discharge: 2020-03-15 | Disposition: A | Discharge status: Home / Self Care | Payer: Medicare PPO | Source: Ambulatory Visit | Attending: Family Medicine | Admitting: Family Medicine

## 2020-03-15 ENCOUNTER — Encounter: Payer: Self-pay | Admitting: Family Medicine

## 2020-03-15 DIAGNOSIS — M542 Cervicalgia: Secondary | ICD-10-CM | POA: Diagnosis present

## 2020-03-15 NOTE — Assessment & Plan Note (Signed)
Acute neck pain , likely arthritic in nature, advised topical massage / rub, and will obtain X ray of Cspine

## 2020-03-16 ENCOUNTER — Encounter: Payer: Self-pay | Admitting: Family Medicine

## 2020-03-18 ENCOUNTER — Telehealth: Payer: Self-pay

## 2020-03-18 NOTE — Telephone Encounter (Signed)
Sent patient mychart message that results will be review tomorrow when provider is back in the office

## 2020-03-18 NOTE — Telephone Encounter (Signed)
Please advise 

## 2020-03-18 NOTE — Telephone Encounter (Signed)
Sent message via Jennifer [name] that the results would be reviewed tomorrow and someone would contact her at that time

## 2020-03-18 NOTE — Telephone Encounter (Signed)
Patient called would like for the nurse to give her a call to go over xrays cervical spine.  Contact # [phone]

## 2020-03-20 ENCOUNTER — Telehealth: Payer: Self-pay

## 2020-03-20 NOTE — Telephone Encounter (Signed)
Pt needs a virtual appointment please schedule

## 2020-03-20 NOTE — Telephone Encounter (Signed)
Patient called she needs a muscle relaxer called into her pharmacy for her neck pain or you can give her a call.    [name]

## 2020-03-20 NOTE — Telephone Encounter (Signed)
Virtual appointment scheduled 3/3

## 2020-03-21 ENCOUNTER — Telehealth (INDEPENDENT_AMBULATORY_CARE_PROVIDER_SITE_OTHER): Payer: Medicare PPO | Admitting: Family Medicine

## 2020-03-21 ENCOUNTER — Other Ambulatory Visit: Payer: Self-pay

## 2020-03-21 ENCOUNTER — Encounter: Payer: Self-pay | Admitting: Family Medicine

## 2020-03-21 VITALS — Ht 64.0 in | Wt 162.0 lb

## 2020-03-21 DIAGNOSIS — R7309 Other abnormal glucose: Secondary | ICD-10-CM | POA: Diagnosis not present

## 2020-03-21 DIAGNOSIS — F509 Eating disorder, unspecified: Secondary | ICD-10-CM | POA: Diagnosis not present

## 2020-03-21 DIAGNOSIS — M542 Cervicalgia: Secondary | ICD-10-CM

## 2020-03-21 DIAGNOSIS — F429 Obsessive-compulsive disorder, unspecified: Secondary | ICD-10-CM | POA: Diagnosis not present

## 2020-03-21 DIAGNOSIS — Z1329 Encounter for screening for other suspected endocrine disorder: Secondary | ICD-10-CM | POA: Diagnosis not present

## 2020-03-21 DIAGNOSIS — Z1321 Encounter for screening for nutritional disorder: Secondary | ICD-10-CM

## 2020-03-21 DIAGNOSIS — F502 Bulimia nervosa: Secondary | ICD-10-CM

## 2020-03-21 DIAGNOSIS — Z131 Encounter for screening for diabetes mellitus: Secondary | ICD-10-CM

## 2020-03-21 DIAGNOSIS — F319 Bipolar disorder, unspecified: Secondary | ICD-10-CM | POA: Diagnosis not present

## 2020-03-21 DIAGNOSIS — Z13220 Encounter for screening for lipoid disorders: Secondary | ICD-10-CM | POA: Diagnosis not present

## 2020-03-21 DIAGNOSIS — F4310 Post-traumatic stress disorder, unspecified: Secondary | ICD-10-CM | POA: Diagnosis not present

## 2020-03-21 MED ORDER — CYCLOBENZAPRINE HCL 5 MG PO TABS: ORAL_TABLET | ORAL | 0 refills | Status: DC

## 2020-03-21 NOTE — Assessment & Plan Note (Addendum)
Ongoing pain rated at an 8, refer to Orthopeidcs, also reporting right shoulder pain Increase tylenol to 500 mg daily and add flexeril 5 mg at bedtime

## 2020-03-21 NOTE — Patient Instructions (Addendum)
F/U in 6 months, call if you need [place] before  New for neck pain is tylenol, increased dose to 500 mg , FOUR tablets daily, you may take one twice daily and two at bedtime  New prescribed medication for neck pain is cyclobenzaprine 5 mg one  At bedtime  You are referred to orthopedics re neck and shoulder pain  Please get fasting CBC, lipid, cmp and EGFR, hBa1C, magnesium, TSH, vit D and HIV  screen, in the next 1 week  It is important that you exercise regularly at least 30 minutes 5 times a week. If you develop chest pain, have severe difficulty breathing, or feel very tired, stop exercising immediately and seek medical attention   Thanks for choosing [facility], we consider it a privelige to serve you.

## 2020-03-24 ENCOUNTER — Encounter: Payer: Self-pay | Admitting: Family Medicine

## 2020-03-24 NOTE — Progress Notes (Signed)
Virtual Visit via Video Note  I connected with Jennifer [name] on 03/24/20 at  3:40 PM EST by a video enabled telemedicine application and verified that I am speaking with the correct person using two identifiers.  Location: Patient: home Provider: remote office  I discussed the limitations of evaluation and management by telemedicine and the availability of in person appointments. The patient expressed understanding and agreed to proceed.  History of Present Illness:   c/o ongoing and increased neck pain radiating into both shoulders , right greater than left, wants mediation help and is also willing to see Ortho specialist before pushing forward with the MRI of her neck that she had originally requested. States the pain makes sleep difficult, continual and no new inciting trauma since last visit Observations/Objective: Ht 5\' 4"  (1.626 m)   Wt 162 lb (73.5 kg)   LMP 10/07/2011   BMI 27.81 kg/m  Good communication with no confusion and intact memory. Alert and oriented x 3 No signs of respiratory distress during speech    Assessment and Plan: Neck pain on right side Ongoing pain rated at an 8, refer to Orthopeid[facility], also reporting right shoulder pain Increase tylenol to 500 mg daily and add flexeril 5 mg at bedtime    Follow Up Instructions:    I discussed the assessment and treatment plan with the patient. The patient was provided an opportunity to ask questions and all were answered. The patient agreed with the plan and demonstrated an understanding of the instructions.   The patient was advised to call back or seek an in-person evaluation if the symptoms worsen or if the condition fails to improve as anticipated.  I provided 11 minutes of non-face-to-face time during this encounter.   Tula Nakayama, MD

## 2020-03-26 ENCOUNTER — Encounter: Payer: Medicare PPO | Admitting: Family Medicine

## 2020-03-26 DIAGNOSIS — R7309 Other abnormal glucose: Secondary | ICD-10-CM | POA: Diagnosis not present

## 2020-03-26 DIAGNOSIS — F502 Bulimia nervosa: Secondary | ICD-10-CM | POA: Diagnosis not present

## 2020-03-26 DIAGNOSIS — Z1329 Encounter for screening for other suspected endocrine disorder: Secondary | ICD-10-CM | POA: Diagnosis not present

## 2020-03-26 DIAGNOSIS — M542 Cervicalgia: Secondary | ICD-10-CM | POA: Diagnosis not present

## 2020-03-26 DIAGNOSIS — Z13220 Encounter for screening for lipoid disorders: Secondary | ICD-10-CM | POA: Diagnosis not present

## 2020-03-26 DIAGNOSIS — Z1321 Encounter for screening for nutritional disorder: Secondary | ICD-10-CM | POA: Diagnosis not present

## 2020-03-26 DIAGNOSIS — Z131 Encounter for screening for diabetes mellitus: Secondary | ICD-10-CM | POA: Diagnosis not present

## 2020-03-27 ENCOUNTER — Telehealth: Payer: Self-pay | Admitting: Family Medicine

## 2020-03-27 ENCOUNTER — Encounter: Payer: Self-pay | Admitting: Family Medicine

## 2020-03-27 DIAGNOSIS — F4310 Post-traumatic stress disorder, unspecified: Secondary | ICD-10-CM | POA: Diagnosis not present

## 2020-03-27 DIAGNOSIS — F319 Bipolar disorder, unspecified: Secondary | ICD-10-CM | POA: Diagnosis not present

## 2020-03-27 DIAGNOSIS — F429 Obsessive-compulsive disorder, unspecified: Secondary | ICD-10-CM | POA: Diagnosis not present

## 2020-03-27 DIAGNOSIS — F509 Eating disorder, unspecified: Secondary | ICD-10-CM | POA: Diagnosis not present

## 2020-03-27 LAB — CMP14+EGFR
ALT: 24 IU/L (ref 0–32)
AST: 26 IU/L (ref 0–40)
Albumin/Globulin Ratio: 1.8 (ref 1.2–2.2)
Albumin: 4.5 g/dL (ref 3.8–4.9)
Alkaline Phosphatase: 97 IU/L (ref 44–121)
BUN/Creatinine Ratio: 17 (ref 9–23)
BUN: 16 mg/dL (ref 6–24)
Bilirubin Total: 0.4 mg/dL (ref 0.0–1.2)
CO2: 22 mmol/L (ref 20–29)
Calcium: 9.7 mg/dL (ref 8.7–10.2)
Chloride: 103 mmol/L (ref 96–106)
Creatinine, Ser: 0.94 mg/dL (ref 0.57–1.00)
Globulin, Total: 2.5 g/dL (ref 1.5–4.5)
Glucose: 111 mg/dL — ABNORMAL HIGH (ref 65–99)
Potassium: 4.3 mmol/L (ref 3.5–5.2)
Sodium: 139 mmol/L (ref 134–144)
Total Protein: 7 g/dL (ref 6.0–8.5)
eGFR: 72 mL/min/{1.73_m2} (ref >59)

## 2020-03-27 LAB — VITAMIN D 25 HYDROXY (VIT D DEFICIENCY, FRACTURES): Vit D, 25-Hydroxy: 48.2 ng/mL (ref 30.0–100.0)

## 2020-03-27 LAB — LIPID PANEL
Chol/HDL Ratio: 2.9 ratio (ref 0.0–4.4)
Cholesterol, Total: 158 mg/dL (ref 100–199)
HDL: 55 mg/dL (ref >39)
LDL Chol Calc (NIH): 90 mg/dL (ref 0–99)
Triglycerides: 65 mg/dL (ref 0–149)
VLDL Cholesterol Cal: 13 mg/dL (ref 5–40)

## 2020-03-27 LAB — HEMOGLOBIN A1C
Est. average glucose Bld gHb Est-mCnc: 117 mg/dL
Hgb A1c MFr Bld: 5.7 % — ABNORMAL HIGH (ref 4.8–5.6)

## 2020-03-27 LAB — CBC WITH DIFFERENTIAL
Basophils Absolute: 0.1 10*3/uL (ref 0.0–0.2)
Basos: 1 %
EOS (ABSOLUTE): 0.2 10*3/uL (ref 0.0–0.4)
Eos: 3 %
Hematocrit: 43.1 % (ref 34.0–46.6)
Hemoglobin: 14.8 g/dL (ref 11.1–15.9)
Immature Grans (Abs): 0 10*3/uL (ref 0.0–0.1)
Immature Granulocytes: 0 %
Lymphocytes Absolute: 2.2 10*3/uL (ref 0.7–3.1)
Lymphs: 29 %
MCH: 30.3 pg (ref 26.6–33.0)
MCHC: 34.3 g/dL (ref 31.5–35.7)
MCV: 88 fL (ref 79–97)
Monocytes Absolute: 0.6 10*3/uL (ref 0.1–0.9)
Monocytes: 8 %
Neutrophils Absolute: 4.5 10*3/uL (ref 1.4–7.0)
Neutrophils: 59 %
RBC: 4.89 x10E6/uL (ref 3.77–5.28)
RDW: 12.8 % (ref 11.7–15.4)
WBC: 7.6 10*3/uL (ref 3.4–10.8)

## 2020-03-27 LAB — MAGNESIUM: Magnesium: 2.2 mg/dL (ref 1.6–2.3)

## 2020-03-27 LAB — TSH: TSH: 0.938 u[IU]/mL (ref 0.450–4.500)

## 2020-03-27 NOTE — Patient Instructions (Signed)
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 Please continue to work closely with PCP and psychiatry. Try to get some vitamin water zero's to help with hydration. Use stevia or erythritol. Consider sleep study and Botox therapy if headaches worsen.   Please make sure you are staying well hydrated. I recommend 50-60 ounces daily. Well balanced diet and regular exercise encouraged. Consistent sleep schedule with 6-8 hours recommended.   Please continue follow up with care team as directed.   Follow up with me in 1 year, sooner if needed   You may receive a survey regarding today's visit. I encourage you to leave honest feed back as I do use this information to improve patient care. Thank you for seeing me today!      Migraine Headache A migraine headache is a very strong throbbing pain on one side or both sides of your head. This type of headache can also cause other symptoms. It can last from 4 hours to 3 days. Talk with your doctor about what things may bring on (trigger) this condition. What are the causes? The exact cause of this condition is not known. This condition may be triggered or caused by:  Drinking alcohol.  Smoking.  Taking medicines, such as: ? Medicine used to treat chest pain (nitroglycerin). ? Birth control pills. ? Estrogen. ? Some blood pressure medicines.  Eating or drinking certain products.  Doing physical activity. Other things that may trigger a migraine headache include:  Having a menstrual period.  Pregnancy.  Hunger.  Stress.  Not getting enough sleep or getting too much sleep.  Weather changes.  Tiredness (fatigue). What increases the risk?  Being 26-58 years old.  Being female.  Having a family history of migraine headaches.  Being Caucasian.  Having depression or anxiety.  Being very overweight. What are the signs or symptoms?  A throbbing pain. This pain may: ? Happen in any area of the head, such as  on one side or both sides. ? Make it hard to do daily activities. ? Get worse with physical activity. ? Get worse around bright lights or loud noises.  Other symptoms may include: ? Feeling sick to your stomach (nauseous). ? Vomiting. ? Dizziness. ? Being sensitive to bright lights, loud noises, or smells.  Before you get a migraine headache, you may get warning signs (an aura). An aura may include: ? Seeing flashing lights or having blind spots. ? Seeing bright spots, halos, or zigzag lines. ? Having tunnel vision or blurred vision. ? Having numbness or a tingling feeling. ? Having trouble talking. ? Having weak muscles.  Some people have symptoms after a migraine headache (postdromal phase), such as: ? Tiredness. ? Trouble thinking (concentrating). How is this treated?  Taking medicines that: ? Relieve pain. ? Relieve the feeling of being sick to your stomach. ? Prevent migraine headaches.  Treatment may also include: ? Having acupuncture. ? Avoiding foods that bring on migraine headaches. ? Learning ways to control your body functions (biofeedback). ? Therapy to help you know and deal with negative thoughts (cognitive behavioral therapy). Follow these instructions at home: Medicines  Take over-the-counter and prescription medicines only as told by your doctor.  Ask your doctor if the medicine prescribed to you: ? Requires you to avoid driving or using heavy machinery. ? Can cause trouble pooping (constipation). You may need to take these steps to prevent or treat trouble pooping:  Drink enough fluid to keep your pee (urine) pale  yellow.  Take over-the-counter or prescription medicines.  Eat foods that are high in fiber. These include beans, whole grains, and fresh fruits and vegetables.  Limit foods that are high in fat and sugar. These include fried or sweet foods. Lifestyle  Do not drink alcohol.  Do not use any products that contain nicotine or tobacco,  such as cigarettes, e-cigarettes, and chewing tobacco. If you need help quitting, ask your doctor.  Get at least 8 hours of sleep every night.  Limit and deal with stress. General instructions  Keep a journal to find out what may bring on your migraine headaches. For example, write down: ? What you eat and drink. ? How much sleep you get. ? Any change in what you eat or drink. ? Any change in your medicines.  If you have a migraine headache: ? Avoid things that make your symptoms worse, such as bright lights. ? It may help to lie down in a dark, quiet room. ? Do not drive or use heavy machinery. ? Ask your doctor what activities are safe for you.  Keep all follow-up visits as told by your doctor. This is important.      Contact a doctor if:  You get a migraine headache that is different or worse than others you have had.  You have more than 15 headache days in one month. Get help right away if:  Your migraine headache gets very bad.  Your migraine headache lasts longer than 72 hours.  You have a fever.  You have a stiff neck.  You have trouble seeing.  Your muscles feel weak or like you cannot control them.  You start to lose your balance a lot.  You start to have trouble walking.  You pass out (faint).  You have a seizure. Summary  A migraine headache is a very strong throbbing pain on one side or both sides of your head. These headaches can also cause other symptoms.  This condition may be treated with medicines and changes to your lifestyle.  Keep a journal to find out what may bring on your migraine headaches.  Contact a doctor if you get a migraine headache that is different or worse than others you have had.  Contact your doctor if you have more than 15 headache days in a month. This information is not intended to replace advice given to you by your health care provider. Make sure you discuss any questions you have with your health care  provider. Document Revised: 04/29/2018 Document Reviewed: 02/17/2018 [employer] Patient Education  [place].

## 2020-03-27 NOTE — Progress Notes (Signed)
PATIENT: Jennifer [name] DOB: 1965-07-09  REASON FOR VISIT: follow up HISTORY FROM: patient  Chief Complaint  Patient presents with   Follow-up    RM 2 alone Pt is well, has headaches in temple region, has 3-4 migraines a month      HISTORY OF PRESENT ILLNESS: 03/28/20 ALL:  Jennifer [name] returns today for follow-up of migraines.  She reports that migraines are stable.  Nurtec has been very helpful in abortive therapy.  She did have a headache 2 days ago and reports having to take Nurtec with Aleve 2 days in a row.  This seems to work well and abortive therapy.  She continues close follow-up with psychiatry.  She is in counseling regularly.  She is followed by primary care.  She has thought about sleep study and does not feel that she could tolerate CPAP.  She is very hesitant of considering Botox therapy.  She feels that symptoms are fairly well managed at this time and does not wish to make any changes today.  She is requesting that we refill Nurtec to [facility] on [address] in [place].   08/31/2019 ALL:  Jennifer [name] is a 55 y.o. female here today for follow up for migraines. She reports that headaches have improved some. She has had 2-3 migraines over the past 2-3 months. She was seen on 8/10 at [facility] for intractable migraine and was given Toradol/ondancetron injection. Amovig was prescribed in 04/2019. She felt that it helped but she could not tolerate skin itching. She reports that her skin would hurt. She tried using Benadryl but reports she gets jittery when she takes it. Symptoms resolved when discontinued. She feels that stress is usually main trigger for migraines. She continues to see psychiatry. She is also seeing a chiropractor and feels this is helping. She does report snoring and insomnia. She wakes with headaches from time to time. She was advised to consider sleep study in past. She is hesitant to consider Botox therapy.    She has tried and failed topiramate (low BP and  dizziness), divalproex (hair loss), Emgality (rash, no respiratory symptoms), Amovig (itching), on gabapentin 1200mg  BID now for anxiety, Imitrex (ineffective), taking Nurtec now for abortive therapy.   HISTORY: (copied from my note on 05/10/2019)  Jennifer [name] is a 55 y.o. female here today for follow up for migraines. She has discontinued divalproex d/t hair loss. Headaches have been somewhat worse. She is having daily headaches. She has 2-3 "severe headaches" per week with pounding pain, light sensitivity and nausea. She will try taking 2 tylenol. She averages at least 2-6 tablets of Tylenol every day. Sometimes this helps and sometimes it doesn't. She has tried [employer] and feels this helps. She has also taken Benadryl which helps.   She has tried and failed topiramate (low BP and dizziness), divalproex (hair loss), Emgality (rash, no respiratory symptoms), on gabapentin 1200mg  BID now for anxiety), Imitrex (ineffective).   She is followed closely by PCP and psychiatry. She is having a lot of mood swings. She is taking Latuda 80mg  daily and gabapentin 1200mg  twice daily. She is scared and anxious as medications haven't been working well. She reports having labs recently to determine which medications may work better for her.   HISTORY: (copied from my note on 02/06/2019)  Jennifer [name] a 55 y.o.femalehere today for follow up for headaches. She was started on divalproex ER 500mg  at bedtime in 09/2018. She reports that headaches did improve for a few  months. Over the past month or so, she has noticed more headaches. She recently started nicotine patches for smoking cessation. She feels this may be correlating. She is also having neck pain and not sleeping well. She has a migraine today, pounding pain with light sensitivity. She usually takes Imitrex that does help but she has been out of medication.She is staying well-hydrated. She states that stress is definitely contributing to her  symptoms. She is followed closely by primary care and psychiatry.   HISTORY: (copied fromDr Dohmeier'snote on 10/04/2018)  Jennifer [name] a 55 y.o.femaleIs seen here as a referral/ revisit from Dr. Sylvan Cheese a headache that she has identified as migraines.   Her headaches are either in the temporal skull, sometimes in the right ( most often 0 sometimes in the left/. But she has sometimes neck pain, too. Migraine can last 3 days, photophobia bothers her, sounds, too. She has nausea.  She used Maxalt with less and less effect, after years of good control. She is allergic to Topamax, she believes it caused her to have HTN. Propanolol was tried, and affected her memory. Depakote was tried with psychiatry- She is not sure why it was discontinued, may be it interfered with Latuda. She is afraid of weight gain.  She hs seen Noelle Redmon at [facility], who wanted her to take [facility] in June 2020 and had given herself one more dose in July .and developed hives, whelps and joint pain. She attributed all this to the [facility].  She felt " forced " to take the shots, concerned about effect on her depression- and the psychiatric medications.  She wants to resume her sucessfull migraine treatment of the past: Toradol, steroids injections " into her hip".She has to be careful with steroids due to "ulcers in her stomach".   For the last 2 month she has been sleeping better after a stressful summer. She has an eating disorder , has been hospitalized 6 times for bulemia, but reports 6 days without binging and purging. New therapist since she is on medicare.    REVIEW OF SYSTEMS: Out of a complete 14 system review of symptoms, the patient complains only of the following symptoms, headaches, anxiety, depression and all other reviewed systems are negative.  ALLERGIES: Allergies  Allergen Reactions   Codeine Shortness Of Breath, Nausea Only and Rash   Penicillins Hives, Shortness Of Breath,  Swelling and Rash    Has patient had a PCN reaction causing immediate rash, facial/tongue/throat swelling, SOB or lightheadedness with hypotension: yes Has patient had a PCN reaction causing severe rash involving mucus membranes or skin necrosis: no Has patient had a PCN reaction that required hospitalization: no Has patient had a PCN reaction occurring within the last 10 years: no If all of the above answers are "NO", then may proceed with Cephalosporin use.;      Divalproex Sodium Hives, Itching and Rash   Meloxicam Other (See Comments)    Possible chest tightness - instructed by MD not to take   Sonata [Zaleplon] Other (See Comments)    Hallucinations   Topamax [Topiramate] Other (See Comments)    Low BP and dizziness   Aimovig [Erenumab-Aooe] Itching   Galcanezumab-Gnlm Rash and Hives    HOME MEDICATIONS: Outpatient Medications Prior to Visit  Medication Sig Dispense Refill   acetaminophen (TYLENOL) 500 MG tablet Take 1,000 mg by mouth daily as needed for fever.      Brexpiprazole ([name]) 4 MG TABS Take 4 mg by mouth in the morning.  30 tablet 11   clonazePAM (KLONOPIN) 0.5 MG tablet Take 1 tablet (0.5 mg total) by mouth 2 (two) times daily as needed for anxiety. 20 tablet 5   clotrimazole-betamethasone (LOTRISONE) cream Apply to affected area 2 times daily prn 15 g 0   cyclobenzaprine (FLEXERIL) 5 MG tablet Take one tablet by mouth at bedtime, as needed, for neck spasm 20 tablet 0   cycloSPORINE (RESTASIS) 0.05 % ophthalmic emulsion Place 1 drop into both eyes 2 (two) times daily.      dicyclomine (BENTYL) 20 MG tablet Take 1 tablet (20 mg total) by mouth 3 (three) times daily as needed for spasms (abdominal cramping). 20 tablet 0   famotidine (PEPCID) 40 MG tablet Take 1 tablet (40 mg total) by mouth 2 (two) times daily. For 12 weeks, then go back to 20mg  twice daily. (Patient taking differently: Take 20 mg by mouth 2 (two) times daily.) 60 tablet 3   fluvoxaMINE  (LUVOX) 100 MG tablet Take 3 tablets (300 mg total) by mouth at bedtime. 270 tablet 1   gabapentin (NEURONTIN) 600 MG tablet Take 2 tablets (1,200 mg total) by mouth 2 (two) times daily. 120 tablet 5   hydrOXYzine (ATARAX/VISTARIL) 25 MG tablet Take 1 tablet (25 mg total) by mouth 3 (three) times daily as needed. 60 tablet 1   hyoscyamine (LEVSIN) 0.125 MG tablet Take 1 tablet (0.125 mg total) by mouth every 4 (four) hours as needed (diarrhea). 120 tablet 1   loratadine (CLARITIN) 10 MG tablet Take 5 mg by mouth daily as needed for allergies.      naproxen sodium (ALEVE) 220 MG tablet Take 440-660 mg by mouth daily as needed (Pt takes with Nurtec as needed.).      olopatadine (PATANOL) 0.1 % ophthalmic solution Place 1 drop into both eyes daily as needed for allergies.      ondansetron (ZOFRAN-ODT) 4 MG disintegrating tablet TAKE ONE TABLET (4MG  TOTAL) BY MOUTH EVERY 8 HOURS AS NEEDED FOR NAUSEA OR VOMITING 30 tablet 1   sodium chloride (OCEAN NASAL SPRAY) 0.65 % nasal spray Place 1 spray into the nose as needed for congestion. 30 mL 12   sucralfate (CARAFATE) 1 g tablet Take 1 tablet (1 g total) by mouth 3 (three) times daily as needed. 30 tablet 3   traZODone (DESYREL) 100 MG tablet Take 1-2 tablets (100-200 mg total) by mouth at bedtime as needed for sleep. 60 tablet 11   valACYclovir (VALTREX) 500 MG tablet Take 500 mg by mouth daily as needed (Flair up take Twice a day  for 3 days and stop).      Rimegepant Sulfate (NURTEC) 75 MG TBDP Take 75 mg by mouth daily as needed (take for abortive therapy of migraine, no more than 1 tablet in 24 hours or 10 per month). 10 tablet 11   fluticasone (FLONASE) 50 MCG/ACT nasal spray Place 1 spray into both nostrils daily for 14 days. 16 g 0   No facility-administered medications prior to visit.    PAST MEDICAL HISTORY: Past Medical History:  Diagnosis Date   Abdominal pain, epigastric 10/07/2018   Acute non-recurrent maxillary sinusitis  11/07/2019   Anxiety    Binge-eating and purging type anorexia nervosa    Bipolar affective disorder ([place]) 12/04/2015   Bipolar affective disorder, current episode manic without psychotic symptoms ([place]) 12/04/2015   Bipolar disorder ([place])    Cellulitis and abscess of buttock 02/15/2013   Cystitis, interstitial    Depression    Depression  Phreesia 01/28/2020   Esophageal polyp    about 20 years ago   GERD (gastroesophageal reflux disease)    Left wrist pain 03/22/2019   OCD (obsessive compulsive disorder)    Palpitations 05/25/2014   Rectal pain 12/06/2018   Rib pain on right side 01/31/2019   Rosacea 03/27/2019   Sprain of temporomandibular joint or ligament 01/10/2019    PAST SURGICAL HISTORY: Past Surgical History:  Procedure Laterality Date   BLADDER SURGERY     CHOLECYSTECTOMY     COLONOSCOPY WITH PROPOFOL N/A 10/05/2019   Procedure: COLONOSCOPY WITH PROPOFOL;  Surgeon: Daneil Dolin, MD;  Location: [facility];  Service: Endoscopy;  Laterality: N/A;  12:45pm   COSMETIC SURGERY N/A    Phreesia 01/28/2020   ESOPHAGOGASTRODUODENOSCOPY  09/28/2016   [facility]; Dr. Therisa Doyne; erosions in the esophagus, 5 cm hiatal hernia, nonbleeding erosive gastropathy s/p biopsied, normal duodenum.  Path with chronic inactive gastritis, no H. pylori or intestinal metaplasia.    ESOPHAGOGASTRODUODENOSCOPY (EGD) WITH PROPOFOL N/A 10/17/2018   Dr. Gala Romney: mild reflux esophagitis, small hiatal hernia   POLYPECTOMY  10/05/2019   Procedure: POLYPECTOMY;  Surgeon: Daneil Dolin, MD;  Location: [facility];  Service: Endoscopy;;   VOCAL CORD LATERALIZATION, ENDOSCOPIC APPROACH W/ MLB      FAMILY HISTORY: Family History  Problem Relation Age of Onset   Healthy Mother    Healthy Father    Colon cancer Neg Hx    Colon polyps Neg Hx     SOCIAL HISTORY: Social History   Socioeconomic History   Marital status: Married    Spouse name: Jennifer [name]   Number of children: 0    Years of education: Not on file   Highest education level: Not on file  Occupational History   Not on file  Tobacco Use   Smoking status: Current Every Day Smoker    Packs/day: 1.00    Years: 20.00    Pack years: 20.00    Types: Cigarettes    Start date: 01/20/1988   Smokeless tobacco: Never Used  Vaping Use   Vaping Use: Never used  Substance and Sexual Activity   Alcohol use: No    Alcohol/week: 0.0 standard drinks   Drug use: No   Sexual activity: Yes    Birth control/protection: None  Other Topics Concern   Not on file  Social History Narrative   Lives with husband -Jennifer [name] of 45 years       Yorkie- Max      Enjoys: reading-all genres      Diet: eats all food groups   Caffeine: 1 cup daily at times, diet dr pepper daily   Water: gatorade  zero and water       Wears seat belt    Does not use phone while driving   Smoke and Development worker, 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         Social Determinants of Radio broadcast assistant Strain: Low Risk    Difficulty of Paying Living Expenses: Not hard at all  Food Insecurity: No Food Insecurity   Worried About Charity fundraiser in the Last Year: Never true   Arboriculturist in the Last Year: Never true  Transportation Needs: No Transportation Needs   Lack of Transportation (Medical): No   Lack of Transportation (Non-Medical): No  Physical Activity: Inactive   Days of Exercise per Week: 0 days   Minutes of  Exercise per Session: 0 min  Stress: No Stress Concern Present   Feeling of Stress : Only a little  Social Connections: Moderately Isolated   Frequency of Communication with Friends and Family: Once a week   Frequency of Social Gatherings with Friends and Family: Once a week   Attends Religious Services: More than 4 times per year   Active Member of [employer] or Organizations: No   Attends Archivist Meetings: Never   Marital Status: Married  Human resources officer  Violence: Not At Risk   Fear of Current or Ex-Partner: No   Emotionally Abused: No   Physically Abused: No   Sexually Abused: No      PHYSICAL EXAM  Vitals:   03/28/20 0920  BP: 115/74  Pulse: 68  Weight: 170 lb (77.1 kg)  Height: 5\' 4"  (1.626 m)   Body mass index is 29.18 kg/m.  Generalized: Well developed, in no acute distress  Cardiology: normal rate and rhythm, no murmur noted Respiratory: clear to auscultation bilaterally  Neurological examination  Mentation: Alert oriented to time, place, history taking. Follows all commands speech and language fluent Cranial nerve II-XII: Pupils were equal round reactive to light. Extraocular movements were full, visual field were full  Motor: The motor testing reveals 5 over 5 strength of all 4 extremities. Good symmetric motor tone is noted throughout.  Gait and station: Gait is normal.   DIAGNOSTIC DATA (LABS, IMAGING, TESTING) - I reviewed patient records, labs, notes, testing and imaging myself where available.  No flowsheet data found.   Lab Results  Component Value Date   WBC 7.6 03/26/2020   HGB 14.8 03/26/2020   HCT 43.1 03/26/2020   MCV 88 03/26/2020   PLT 187 03/20/2019      Component Value Date/Time   NA 139 03/26/2020 0817   K 4.3 03/26/2020 0817   CL 103 03/26/2020 0817   CO2 22 03/26/2020 0817   GLUCOSE 111 (H) 03/26/2020 0817   GLUCOSE 127 (H) 03/20/2019 1233   BUN 16 03/26/2020 0817   CREATININE 0.94 03/26/2020 0817   CREATININE 0.76 12/06/2017 1142   CALCIUM 9.7 03/26/2020 0817   PROT 7.0 03/26/2020 0817   ALBUMIN 4.5 03/26/2020 0817   AST 26 03/26/2020 0817   ALT 24 03/26/2020 0817   ALKPHOS 97 03/26/2020 0817   BILITOT 0.4 03/26/2020 0817   GFRNONAA >60 03/20/2019 1233   GFRNONAA 90 12/06/2017 1142   GFRAA >60 03/20/2019 1233   GFRAA 105 12/06/2017 1142   Lab Results  Component Value Date   CHOL 158 03/26/2020   HDL 55 03/26/2020   LDLCALC 90 03/26/2020   TRIG 65 03/26/2020    CHOLHDL 2.9 03/26/2020   Lab Results  Component Value Date   HGBA1C 5.7 (H) 03/26/2020   No results found for: VITAMINB12 Lab Results  Component Value Date   TSH 0.938 03/26/2020       ASSESSMENT AND PLAN 55 y.o. year old female  has a past medical history of Abdominal pain, epigastric (10/07/2018), Acute non-recurrent maxillary sinusitis (11/07/2019), Anxiety, Binge-eating and purging type anorexia nervosa, Bipolar affective disorder ([place]) (12/04/2015), Bipolar affective disorder, current episode manic without psychotic symptoms ([place]) (12/04/2015), Bipolar disorder ([place]), Cellulitis and abscess of buttock (02/15/2013), Cystitis, interstitial, Depression, Depression, Esophageal polyp, GERD (gastroesophageal reflux disease), Left wrist pain (03/22/2019), OCD (obsessive compulsive disorder), Palpitations (05/25/2014), Rectal pain (12/06/2018), Rib pain on right side (01/31/2019), Rosacea (03/27/2019), and Sprain of temporomandibular joint or ligament (01/10/2019). here with  ICD-10-CM   1. Intractable chronic migraine without aura and without status migrainosus  G43.719       Inella feels that headaches are well managed at this time.  Nurtec is working well for abortive therapy.  She was advised against regular use of Aleve.  She will continue close follow-up with psychiatry and primary care.  Healthy lifestyle habits encouraged.  I will continue to offer referral to sleep medicine and Botox therapy if headaches worsen.  She is aware that she may call me as needed.  We will follow up with her in 1 year, sooner if needed.  She verbalizes understanding and agreement with this plan.   No orders of the defined types were placed in this encounter.    Meds ordered this encounter  Medications   Rimegepant Sulfate (NURTEC) 75 MG TBDP    Sig: Take 75 mg by mouth daily as needed (take for abortive therapy of migraine, no more than 1 tablet in 24 hours or 10 per month).    Dispense:  10 tablet     Refill:  11    Order Specific Question:   Supervising Provider    Answer:   Melvenia Beam [id number]      I spent 15 minutes with the patient. 50% of this time was spent counseling and educating patient on plan of care and medications.    Debbora Presto, FNP-C 03/28/2020, 10:59 AM [facility] [address] [place], [place] [address] [phone]

## 2020-03-27 NOTE — Telephone Encounter (Signed)
Pt went to bed with a bad headache, woke up with one.  Pt has headache even after taking all suggested meds, pt asking to be called about wanting to come in for a shot/injection for relief

## 2020-03-27 NOTE — Telephone Encounter (Addendum)
See pt mychart message that she sent after calling, no longer needing to come in

## 2020-03-27 NOTE — Telephone Encounter (Signed)
I called pt and she said she woke up with headache, has taken meds, used ice and she said that she has had waves of pain.  I relayed that we have not see her since 08-2019.  Could she come in tomorrow at 0930.  She said she would, was very Patent attorney.  She said her nurtec usually works for her.

## 2020-03-28 ENCOUNTER — Other Ambulatory Visit: Payer: Self-pay

## 2020-03-28 ENCOUNTER — Ambulatory Visit: Payer: Medicare PPO | Admitting: Family Medicine

## 2020-03-28 ENCOUNTER — Encounter: Payer: Self-pay | Admitting: Family Medicine

## 2020-03-28 VITALS — BP 115/74 | HR 68 | Ht 64.0 in | Wt 170.0 lb

## 2020-03-28 DIAGNOSIS — G43719 Chronic migraine without aura, intractable, without status migrainosus: Secondary | ICD-10-CM | POA: Diagnosis not present

## 2020-03-28 MED ORDER — NURTEC 75 MG PO TBDP: 75.0000 mg | ORAL_TABLET | Freq: Every day | ORAL | 11 refills | Status: DC | PRN

## 2020-03-29 ENCOUNTER — Other Ambulatory Visit: Payer: Self-pay

## 2020-03-29 ENCOUNTER — Encounter: Payer: Self-pay | Admitting: Orthopedic Surgery

## 2020-03-29 ENCOUNTER — Ambulatory Visit (INDEPENDENT_AMBULATORY_CARE_PROVIDER_SITE_OTHER): Payer: Medicare PPO | Admitting: Orthopedic Surgery

## 2020-03-29 VITALS — BP 109/71 | HR 72 | Ht 64.0 in | Wt 169.4 lb

## 2020-03-29 DIAGNOSIS — M47812 Spondylosis without myelopathy or radiculopathy, cervical region: Secondary | ICD-10-CM | POA: Diagnosis not present

## 2020-03-29 NOTE — Progress Notes (Signed)
New Patient Visit  Assessment: Jennifer [name] is a 55 y.o. female with the following: Spondylosis of cervical region without myelopathy or radiculopathy   Plan: Jennifer [name] has some mild arthritic changes in her cervical spine.  She had been having significant pain in the neck region, with minimal radiating symptoms.  This has significantly improved over the last 1-2 weeks.  At this point in time, she has no radiating pains into either shoulder.  No concerns with her balance overall.  She has no issues with fine motor movements.  We reviewed the radiographs which demonstrates mild symptoms overall.  We discussed return precautions, including, but not limited to radiating pains, numbness tingling in hands and issues with her balance.  She stated understanding.  No further treatment is warranted.  We are available if she has any issues in the future.   Follow-up: No follow-ups on file.  Subjective:  Chief Complaint  Patient presents with  . Neck Pain    Patient reports the blue cream for arthritis is helping. She reports she is doing much better, She was told she had arthritis in her neck.     History of Present Illness: Jennifer [name] is a 55 y.o. female who has been referred to clinic today by Tula Nakayama, MD for evaluation of neck pain.  She has had neck pain, primarily in the right side for the last 3-4 weeks.  She was evaluated by Dr. Moshe Cipro, and x-rays have been ordered.  She has been taking over-the-counter pain medications, and was provided with some Flexeril.  She is also been using some topical treatments for her pain.  Over the last 1-2 weeks her pain is significantly better.  She states that she almost did not come to the appointment because of her recent improvements, however she did want to discuss the x-ray findings in particular.  She has been using heat which improves her symptoms.  She is not had any physical therapy.  She has been taking Flexeril, but is concerned  because this makes her very sleepy.  She specifically denies radiating pains into her hands on either side.  She does have some spasm type pain into her right shoulder.  No issues with her balance.  She is not noticed any issues with her fine motor movements.   Review of Systems: No fevers or chills No numbness or tingling No chest pain No shortness of breath No bowel or bladder dysfunction No GI distress No headaches   Medical History:  Past Medical History:  Diagnosis Date  . Abdominal pain, epigastric 10/07/2018  . Acute non-recurrent maxillary sinusitis 11/07/2019  . Anxiety   . Binge-eating and purging type anorexia nervosa   . Bipolar affective disorder ([place]) 12/04/2015  . Bipolar affective disorder, current episode manic without psychotic symptoms ([place]) 12/04/2015  . Bipolar disorder ([place])   . Cellulitis and abscess of buttock 02/15/2013  . Cystitis, interstitial   . Depression   . Depression    Phreesia 01/28/2020  . Esophageal polyp    about 20 years ago  . GERD (gastroesophageal reflux disease)   . Left wrist pain 03/22/2019  . OCD (obsessive compulsive disorder)   . Palpitations 05/25/2014  . Rectal pain 12/06/2018  . Rib pain on right side 01/31/2019  . Rosacea 03/27/2019  . Sprain of temporomandibular joint or ligament 01/10/2019    Past Surgical History:  Procedure Laterality Date  . BLADDER SURGERY    . CHOLECYSTECTOMY    . COLONOSCOPY WITH PROPOFOL  N/A 10/05/2019   Procedure: COLONOSCOPY WITH PROPOFOL;  Surgeon: Daneil Dolin, MD;  [facility];  Service: Endoscopy;  Laterality: N/A;  12:45pm  . COSMETIC SURGERY N/A    Phreesia 01/28/2020  . ESOPHAGOGASTRODUODENOSCOPY  09/28/2016   [facility]; Dr. Therisa Doyne; erosions in the esophagus, 5 cm hiatal hernia, nonbleeding erosive gastropathy s/p biopsied, normal duodenum.  Path with chronic inactive gastritis, no H. pylori or intestinal metaplasia.   Marland Kitchen ESOPHAGOGASTRODUODENOSCOPY (EGD) WITH PROPOFOL N/A  10/17/2018   Dr. Gala Romney: mild reflux esophagitis, small hiatal hernia  . POLYPECTOMY  10/05/2019   Procedure: POLYPECTOMY;  Surgeon: Daneil Dolin, MD;  Location: [facility];  Service: Endoscopy;;  . VOCAL CORD LATERALIZATION, ENDOSCOPIC APPROACH W/ MLB      Family History  Problem Relation Age of Onset  . Healthy Mother   . Healthy Father   . Colon cancer Neg Hx   . Colon polyps Neg Hx    Social History   Tobacco Use  . Smoking status: Current Every Day Smoker    Packs/day: 1.00    Years: 20.00    Pack years: 20.00    Types: Cigarettes    Start date: 01/20/1988  . Smokeless tobacco: Never Used  Vaping Use  . Vaping Use: Never used  Substance Use Topics  . Alcohol use: No    Alcohol/week: 0.0 standard drinks  . Drug use: No    Allergies  Allergen Reactions  . Codeine Shortness Of Breath, Nausea Only and Rash  . Penicillins Hives, Shortness Of Breath, Swelling and Rash    Has patient had a PCN reaction causing immediate rash, facial/tongue/throat swelling, SOB or lightheadedness with hypotension: yes Has patient had a PCN reaction causing severe rash involving mucus membranes or skin necrosis: no Has patient had a PCN reaction that required hospitalization: no Has patient had a PCN reaction occurring within the last 10 years: no If all of the above answers are "NO", then may proceed with Cephalosporin use.;     . Divalproex Sodium Hives, Itching and Rash  . Meloxicam Other (See Comments)    Possible chest tightness - instructed by MD not to take  . Sonata [Zaleplon] Other (See Comments)    Hallucinations  . Topamax [Topiramate] Other (See Comments)    Low BP and dizziness  . Aimovig [Erenumab-Aooe] Itching  . Galcanezumab-Gnlm Rash and Hives    Current Meds  Medication Sig  . acetaminophen (TYLENOL) 500 MG tablet Take 1,000 mg by mouth daily as needed for fever.   . Brexpiprazole ([name]) 4 MG TABS Take 4 mg by mouth in the morning.  . clonazePAM (KLONOPIN)  0.5 MG tablet Take 1 tablet (0.5 mg total) by mouth 2 (two) times daily as needed for anxiety.  . clotrimazole-betamethasone (LOTRISONE) cream Apply to affected area 2 times daily prn  . cyclobenzaprine (FLEXERIL) 5 MG tablet Take one tablet by mouth at bedtime, as needed, for neck spasm  . cycloSPORINE (RESTASIS) 0.05 % ophthalmic emulsion Place 1 drop into both eyes 2 (two) times daily.   Marland Kitchen dicyclomine (BENTYL) 20 MG tablet Take 1 tablet (20 mg total) by mouth 3 (three) times daily as needed for spasms (abdominal cramping).  . famotidine (PEPCID) 40 MG tablet Take 1 tablet (40 mg total) by mouth 2 (two) times daily. For 12 weeks, then go back to 20mg  twice daily. (Patient taking differently: Take 20 mg by mouth 2 (two) times daily.)  . fluvoxaMINE (LUVOX) 100 MG tablet Take 3  tablets (300 mg total) by mouth at bedtime.  . gabapentin (NEURONTIN) 600 MG tablet Take 2 tablets (1,200 mg total) by mouth 2 (two) times daily.  . hydrOXYzine (ATARAX/VISTARIL) 25 MG tablet Take 1 tablet (25 mg total) by mouth 3 (three) times daily as needed.  . hyoscyamine (LEVSIN) 0.125 MG tablet Take 1 tablet (0.125 mg total) by mouth every 4 (four) hours as needed (diarrhea).  . loratadine (CLARITIN) 10 MG tablet Take 5 mg by mouth daily as needed for allergies.   . naproxen sodium (ALEVE) 220 MG tablet Take 440-660 mg by mouth daily as needed (Pt takes with Nurtec as needed.).   Marland Kitchen olopatadine (PATANOL) 0.1 % ophthalmic solution Place 1 drop into both eyes daily as needed for allergies.   Marland Kitchen ondansetron (ZOFRAN-ODT) 4 MG disintegrating tablet TAKE ONE TABLET (4MG  TOTAL) BY MOUTH EVERY 8 HOURS AS NEEDED FOR NAUSEA OR VOMITING  . Rimegepant Sulfate (NURTEC) 75 MG TBDP Take 75 mg by mouth daily as needed (take for abortive therapy of migraine, no more than 1 tablet in 24 hours or 10 per month).  . sodium chloride (OCEAN NASAL SPRAY) 0.65 % nasal spray Place 1 spray into the nose as needed for congestion.  . sucralfate  (CARAFATE) 1 g tablet Take 1 tablet (1 g total) by mouth 3 (three) times daily as needed.  . traZODone (DESYREL) 100 MG tablet Take 1-2 tablets (100-200 mg total) by mouth at bedtime as needed for sleep.  . valACYclovir (VALTREX) 500 MG tablet Take 500 mg by mouth daily as needed (Flair up take Twice a day  for 3 days and stop).     Objective: BP 109/71   Pulse 72   Ht 5\' 4"  (1.626 m)   Wt 169 lb 6.4 oz (76.8 kg)   LMP 10/07/2011   BMI 29.08 kg/m   Physical Exam:  General: Alert and oriented, no acute distress. Gait: Normal  Evaluation of the neck demonstrates no deformity.  She does have some mild tenderness to palpation within the muscle on both sides of her neck.  Slightly restricted neck range of motion.  She has intact sensation in the axillary, superficial radial, median and ulnar nerve distributions.  Strength in the deltoids, biceps, triceps and grip strength is 5/5.    IMAGING: I personally reviewed images previously obtained in clinic  X-rays of the cervical spine were reviewed in clinic today and demonstrates some mild overall arthritic changes.  There are some osteophytes noted at the C5-6 level.  The radiologist noted 1-2 mm of anterolisthesis.  Overall mild cervical spine arthritis.  New Medications:  No orders of the defined types were placed in this encounter.     Mordecai Rasmussen, MD  03/29/2020 9:20 AM

## 2020-04-03 ENCOUNTER — Ambulatory Visit: Payer: Medicare PPO | Admitting: Family Medicine

## 2020-04-03 DIAGNOSIS — F4310 Post-traumatic stress disorder, unspecified: Secondary | ICD-10-CM | POA: Diagnosis not present

## 2020-04-03 DIAGNOSIS — F319 Bipolar disorder, unspecified: Secondary | ICD-10-CM | POA: Diagnosis not present

## 2020-04-03 DIAGNOSIS — F509 Eating disorder, unspecified: Secondary | ICD-10-CM | POA: Diagnosis not present

## 2020-04-03 DIAGNOSIS — F429 Obsessive-compulsive disorder, unspecified: Secondary | ICD-10-CM | POA: Diagnosis not present

## 2020-04-10 DIAGNOSIS — F4310 Post-traumatic stress disorder, unspecified: Secondary | ICD-10-CM | POA: Diagnosis not present

## 2020-04-10 DIAGNOSIS — F429 Obsessive-compulsive disorder, unspecified: Secondary | ICD-10-CM | POA: Diagnosis not present

## 2020-04-10 DIAGNOSIS — F509 Eating disorder, unspecified: Secondary | ICD-10-CM | POA: Diagnosis not present

## 2020-04-10 DIAGNOSIS — F319 Bipolar disorder, unspecified: Secondary | ICD-10-CM | POA: Diagnosis not present

## 2020-04-17 DIAGNOSIS — F509 Eating disorder, unspecified: Secondary | ICD-10-CM | POA: Diagnosis not present

## 2020-04-17 DIAGNOSIS — F319 Bipolar disorder, unspecified: Secondary | ICD-10-CM | POA: Diagnosis not present

## 2020-04-17 DIAGNOSIS — F429 Obsessive-compulsive disorder, unspecified: Secondary | ICD-10-CM | POA: Diagnosis not present

## 2020-04-17 DIAGNOSIS — F4310 Post-traumatic stress disorder, unspecified: Secondary | ICD-10-CM | POA: Diagnosis not present

## 2020-04-20 DIAGNOSIS — M2669 Other specified disorders of temporomandibular joint: Secondary | ICD-10-CM | POA: Diagnosis not present

## 2020-04-20 DIAGNOSIS — Z791 Long term (current) use of non-steroidal anti-inflammatories (NSAID): Secondary | ICD-10-CM | POA: Diagnosis not present

## 2020-04-20 DIAGNOSIS — M62838 Other muscle spasm: Secondary | ICD-10-CM | POA: Diagnosis not present

## 2020-04-20 DIAGNOSIS — B009 Herpesviral infection, unspecified: Secondary | ICD-10-CM | POA: Diagnosis not present

## 2020-04-20 DIAGNOSIS — Z6829 Body mass index (BMI) 29.0-29.9, adult: Secondary | ICD-10-CM | POA: Diagnosis not present

## 2020-04-20 DIAGNOSIS — G8929 Other chronic pain: Secondary | ICD-10-CM | POA: Diagnosis not present

## 2020-04-20 DIAGNOSIS — Z604 Social exclusion and rejection: Secondary | ICD-10-CM | POA: Diagnosis not present

## 2020-04-20 DIAGNOSIS — R32 Unspecified urinary incontinence: Secondary | ICD-10-CM | POA: Diagnosis not present

## 2020-04-20 DIAGNOSIS — F4310 Post-traumatic stress disorder, unspecified: Secondary | ICD-10-CM | POA: Diagnosis not present

## 2020-04-20 DIAGNOSIS — R062 Wheezing: Secondary | ICD-10-CM | POA: Diagnosis not present

## 2020-04-20 DIAGNOSIS — R0602 Shortness of breath: Secondary | ICD-10-CM | POA: Diagnosis not present

## 2020-04-20 DIAGNOSIS — G43909 Migraine, unspecified, not intractable, without status migrainosus: Secondary | ICD-10-CM | POA: Diagnosis not present

## 2020-04-20 DIAGNOSIS — F502 Bulimia nervosa: Secondary | ICD-10-CM | POA: Diagnosis not present

## 2020-04-20 DIAGNOSIS — R030 Elevated blood-pressure reading, without diagnosis of hypertension: Secondary | ICD-10-CM | POA: Diagnosis not present

## 2020-04-20 DIAGNOSIS — F319 Bipolar disorder, unspecified: Secondary | ICD-10-CM | POA: Diagnosis not present

## 2020-04-20 DIAGNOSIS — F411 Generalized anxiety disorder: Secondary | ICD-10-CM | POA: Diagnosis not present

## 2020-04-20 DIAGNOSIS — G4700 Insomnia, unspecified: Secondary | ICD-10-CM | POA: Diagnosis not present

## 2020-04-20 DIAGNOSIS — E663 Overweight: Secondary | ICD-10-CM | POA: Diagnosis not present

## 2020-04-24 DIAGNOSIS — F509 Eating disorder, unspecified: Secondary | ICD-10-CM | POA: Diagnosis not present

## 2020-04-24 DIAGNOSIS — F319 Bipolar disorder, unspecified: Secondary | ICD-10-CM | POA: Diagnosis not present

## 2020-04-24 DIAGNOSIS — F429 Obsessive-compulsive disorder, unspecified: Secondary | ICD-10-CM | POA: Diagnosis not present

## 2020-04-24 DIAGNOSIS — F4310 Post-traumatic stress disorder, unspecified: Secondary | ICD-10-CM | POA: Diagnosis not present

## 2020-04-29 DIAGNOSIS — M25832 Other specified joint disorders, left wrist: Secondary | ICD-10-CM | POA: Diagnosis not present

## 2020-04-29 DIAGNOSIS — M1812 Unilateral primary osteoarthritis of first carpometacarpal joint, left hand: Secondary | ICD-10-CM | POA: Diagnosis not present

## 2020-04-29 DIAGNOSIS — M24132 Other articular cartilage disorders, left wrist: Secondary | ICD-10-CM | POA: Diagnosis not present

## 2020-04-29 DIAGNOSIS — M654 Radial styloid tenosynovitis [de Quervain]: Secondary | ICD-10-CM | POA: Diagnosis not present

## 2020-05-01 DIAGNOSIS — F4310 Post-traumatic stress disorder, unspecified: Secondary | ICD-10-CM | POA: Diagnosis not present

## 2020-05-01 DIAGNOSIS — F429 Obsessive-compulsive disorder, unspecified: Secondary | ICD-10-CM | POA: Diagnosis not present

## 2020-05-01 DIAGNOSIS — F509 Eating disorder, unspecified: Secondary | ICD-10-CM | POA: Diagnosis not present

## 2020-05-01 DIAGNOSIS — F319 Bipolar disorder, unspecified: Secondary | ICD-10-CM | POA: Diagnosis not present

## 2020-05-08 DIAGNOSIS — F429 Obsessive-compulsive disorder, unspecified: Secondary | ICD-10-CM | POA: Diagnosis not present

## 2020-05-08 DIAGNOSIS — F509 Eating disorder, unspecified: Secondary | ICD-10-CM | POA: Diagnosis not present

## 2020-05-08 DIAGNOSIS — F319 Bipolar disorder, unspecified: Secondary | ICD-10-CM | POA: Diagnosis not present

## 2020-05-08 DIAGNOSIS — F4310 Post-traumatic stress disorder, unspecified: Secondary | ICD-10-CM | POA: Diagnosis not present

## 2020-05-14 ENCOUNTER — Encounter: Payer: Self-pay | Admitting: Internal Medicine

## 2020-05-14 ENCOUNTER — Other Ambulatory Visit: Payer: Self-pay

## 2020-05-14 ENCOUNTER — Ambulatory Visit: Payer: Medicare PPO | Admitting: Internal Medicine

## 2020-05-14 VITALS — BP 117/78 | HR 89 | Temp 96.9°F | Ht 64.0 in | Wt 170.4 lb

## 2020-05-14 DIAGNOSIS — K219 Gastro-esophageal reflux disease without esophagitis: Secondary | ICD-10-CM

## 2020-05-14 DIAGNOSIS — F502 Bulimia nervosa: Secondary | ICD-10-CM

## 2020-05-14 MED ORDER — RABEPRAZOLE SODIUM 20 MG PO TBEC: 20.0000 mg | DELAYED_RELEASE_TABLET | Freq: Every day | ORAL | 11 refills | Status: DC

## 2020-05-14 NOTE — Patient Instructions (Signed)
Trial of Aciphex or rabeprazole 20 mg once daily in the morning before breakfast for GERD.  Dispense 30 with 11 refills  Only use NSAIDs like Aleve when necessary  Absolutely continue cognitive behavioral therapy as it is of great value for you  If Aciphex works well for you you can probably taper off the famotidine/Pepcid and Carafate  Office visit here in 8 weeks  If you have any interim issues with that new medication, please call us.  Colonoscopy for screening purposes in 10 years.

## 2020-05-14 NOTE — Progress Notes (Signed)
Primary Care Physician:  Fayrene Helper, MD Primary Gastroenterologist:  Dr. Gala Romney  Pre-Procedure History & Physical: HPI:  Jennifer [name] is a 55 y.o. female here for follow-up of epigastric pain/GERD.  Long history of anorexia nervosa which is morphed into more bulimia over the last several years.  However, with ongoing cognitive behavioral therapy episodes become PU and far between but they still occur.  She takes NSAIDs on demand for migraine headaches.  Famotidine nightly for reflux along with Carafate.  After she vomits she does get epigastric discomfort.  No dysphagia.  No melena rectal bleeding.  Recent colonoscopy for positive Cologuard negative.  Prior EGD 2 years ago demonstrate mild reflux esophagitis small hiatal hernia.  Previously intolerant to omeprazole and Protonix (diarrhea)  Past Medical History:  Diagnosis Date  . Abdominal pain, epigastric 10/07/2018  . Acute non-recurrent maxillary sinusitis 11/07/2019  . Anxiety   . Binge-eating and purging type anorexia nervosa   . Bipolar affective disorder ([place]) 12/04/2015  . Bipolar affective disorder, current episode manic without psychotic symptoms ([place]) 12/04/2015  . Bipolar disorder ([place])   . Cellulitis and abscess of buttock 02/15/2013  . Cystitis, interstitial   . Depression   . Depression    Phreesia 01/28/2020  . Esophageal polyp    about 20 years ago  . GERD (gastroesophageal reflux disease)   . Left wrist pain 03/22/2019  . OCD (obsessive compulsive disorder)   . Palpitations 05/25/2014  . Rectal pain 12/06/2018  . Rib pain on right side 01/31/2019  . Rosacea 03/27/2019  . Sprain of temporomandibular joint or ligament 01/10/2019    Past Surgical History:  Procedure Laterality Date  . BLADDER SURGERY    . CHOLECYSTECTOMY    . COLONOSCOPY WITH PROPOFOL N/A 10/05/2019   Procedure: COLONOSCOPY WITH PROPOFOL;  Surgeon: Daneil Dolin, MD;  [facility];  Service: Endoscopy;  Laterality: N/A;   12:45pm  . COSMETIC SURGERY N/A    Phreesia 01/28/2020  . ESOPHAGOGASTRODUODENOSCOPY  09/28/2016   [facility]; Dr. Therisa Doyne; erosions in the esophagus, 5 cm hiatal hernia, nonbleeding erosive gastropathy s/p biopsied, normal duodenum.  Path with chronic inactive gastritis, no H. pylori or intestinal metaplasia.   Marland Kitchen ESOPHAGOGASTRODUODENOSCOPY (EGD) WITH PROPOFOL N/A 10/17/2018   Dr. Gala Romney: mild reflux esophagitis, small hiatal hernia  . POLYPECTOMY  10/05/2019   Procedure: POLYPECTOMY;  Surgeon: Daneil Dolin, MD;  [facility];  Service: Endoscopy;;  . VOCAL CORD LATERALIZATION, ENDOSCOPIC APPROACH W/ MLB      Prior to Admission medications   Medication Sig Start Date End Date Taking? Authorizing Provider  acetaminophen (TYLENOL) 500 MG tablet Take 1,000 mg by mouth daily as needed for fever.    Yes [provider]  Brexpiprazole ([name]) 4 MG TABS Take 4 mg by mouth in the morning. 01/22/20  Yes [name] T, PA-C  celecoxib (CELEBREX) 200 MG capsule Take 200 mg by mouth daily. 04/29/20  Yes [provider]  CHLORPHENIRAMINE MALEATE PO Take by mouth. Once a day as needed for allergies   Yes [provider]  clonazePAM (KLONOPIN) 0.5 MG tablet Take 1 tablet (0.5 mg total) by mouth 2 (two) times daily as needed for anxiety. 11/28/19  Yes [name] T, PA-C  clotrimazole-betamethasone (LOTRISONE) cream Apply to affected area 2 times daily prn 02/05/20  Yes Faustino Congress, NP  cyclobenzaprine (FLEXERIL) 5 MG tablet Take one tablet by mouth at bedtime, as needed, for neck spasm 03/21/20  Yes  Fayrene Helper, MD  cycloSPORINE (RESTASIS) 0.05 % ophthalmic emulsion Place 1 drop into both eyes 2 (two) times daily.    Yes [provider]  dicyclomine (BENTYL) 20 MG tablet Take 1 tablet (20 mg total) by mouth 3 (three) times daily as needed for spasms (abdominal cramping). 07/07/18  Yes Long, Wonda Olds, MD  famotidine (PEPCID) 40 MG tablet Take 1 tablet  (40 mg total) by mouth 2 (two) times daily. For 12 weeks, then go back to 20mg  twice daily. Patient taking differently: Take 20 mg by mouth 2 (two) times daily. 05/03/19  Yes Mahala Menghini, PA-C  fluvoxaMINE (LUVOX) 100 MG tablet Take 3 tablets (300 mg total) by mouth at bedtime. 11/28/19  Yes Hurst, Helene Kelp T, PA-C  gabapentin (NEURONTIN) 600 MG tablet Take 2 tablets (1,200 mg total) by mouth 2 (two) times daily. 11/28/19  Yes [name] T, PA-C  hydrOXYzine (ATARAX/VISTARIL) 25 MG tablet Take 1 tablet (25 mg total) by mouth 3 (three) times daily as needed. 01/22/20  Yes Hurst, Dorothea Glassman, PA-C  hyoscyamine (LEVSIN) 0.125 MG tablet Take 1 tablet (0.125 mg total) by mouth every 4 (four) hours as needed (diarrhea). 05/03/19  Yes Mahala Menghini, PA-C  loratadine (CLARITIN) 10 MG tablet Take 5 mg by mouth daily as needed for allergies.    Yes [provider]  naproxen sodium (ALEVE) 220 MG tablet Take 440-660 mg by mouth daily as needed (Pt takes with Nurtec as needed.).    Yes [provider]  olopatadine (PATANOL) 0.1 % ophthalmic solution Place 1 drop into both eyes daily as needed for allergies.    Yes [provider]  ondansetron (ZOFRAN-ODT) 4 MG disintegrating tablet TAKE ONE TABLET (4MG  TOTAL) BY MOUTH EVERY 8 HOURS AS NEEDED FOR NAUSEA OR VOMITING 10/17/19  Yes Lomax, Amy, NP  Rimegepant Sulfate (NURTEC) 75 MG TBDP Take 75 mg by mouth daily as needed (take for abortive therapy of migraine, no more than 1 tablet in 24 hours or 10 per month). 03/28/20  Yes Lomax, Amy, NP  sodium chloride (OCEAN NASAL SPRAY) 0.65 % nasal spray Place 1 spray into the nose as needed for congestion. 02/15/20  Yes Lindell Spar, MD  traZODone (DESYREL) 100 MG tablet Take 1-2 tablets (100-200 mg total) by mouth at bedtime as needed for sleep. 01/22/20  Yes Hurst, Dorothea Glassman, PA-C  valACYclovir (VALTREX) 500 MG tablet Take 500 mg by mouth daily as needed (Flair up take Twice a day  for 3 days and stop).   01/03/18  Yes [provider]  fluticasone (FLONASE) 50 MCG/ACT nasal spray Place 1 spray into both nostrils daily for 14 days. 02/08/20 02/22/20  Avegno, Darrelyn Hillock, FNP  sucralfate (CARAFATE) 1 g tablet Take 1 tablet (1 g total) by mouth 3 (three) times daily as needed. Patient not taking: Reported on 05/14/2020 12/07/19   Carlis Stable, NP    Allergies as of 05/14/2020 - Review Complete 05/14/2020  Allergen Reaction Noted  . Codeine Shortness Of Breath, Nausea Only, and Rash 02/15/2013  . Penicillins Hives, Shortness Of Breath, Swelling, and Rash 10/09/2011  . Divalproex sodium Hives, Itching, and Rash 01/28/2020  . Meloxicam Other (See Comments) 05/08/2014  . Sonata [zaleplon] Other (See Comments) 10/22/2017  . Topamax [topiramate] Other (See Comments) 10/04/2018  . Aimovig [erenumab-aooe] Itching 06/26/2019  . Galcanezumab-gnlm Rash and Hives 10/04/2018    Family History  Problem Relation Age of Onset  . Healthy Mother   . Healthy  Father   . Colon cancer Neg Hx   . Colon polyps Neg Hx     Social History   Socioeconomic History  . Marital status: Married    Spouse name: [name]  . Number of children: 0  . Years of education: Not on file  . Highest education level: Not on file  Occupational History  . Not on file  Tobacco Use  . Smoking status: Current Every Day Smoker    Packs/day: 1.00    Years: 20.00    Pack years: 20.00    Types: Cigarettes    Start date: 01/20/1988  . Smokeless tobacco: Never Used  Vaping Use  . Vaping Use: Never used  Substance and Sexual Activity  . Alcohol use: No    Alcohol/week: 0.0 standard drinks  . Drug use: No  . Sexual activity: Yes    Birth control/protection: None  Other Topics Concern  . 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 Difficulty of Paying Living Expenses: Not hard at all  Food Insecurity: No Food Insecurity  . Worried About Charity fundraiser in the Last Year: Never true  . Ran Out of Food in the Last Year: Never true  Transportation Needs: No Transportation Needs  . Lack of Transportation (Medical): No  . Lack of Transportation (Non-Medical): No  Physical Activity: Inactive  . Days of Exercise per Week: 0 days  . Minutes of Exercise per Session: 0 min  Stress: No Stress Concern Present  . Feeling of Stress : Only a little  Social Connections: Moderately Isolated  . Frequency of Communication with Friends and Family: Once a week  . Frequency of Social Gatherings with Friends and Family: Once a week  . Attends Religious Services: More than 4 times per year  . Active Member of Clubs or Organizations: No  . Attends Archivist Meetings: Never  . Marital Status: Married  Human resources officer Violence: Not At Risk  . Fear of Current or Ex-Partner: No  . Emotionally Abused: No  . Physically Abused: No  . Sexually Abused: No    Review of Systems: See HPI, otherwise negative ROS  Physical Exam: BP 117/78   Pulse 89   Temp (!) 96.9 F (36.1 C)   Ht 5\' 4"  (1.626 m)   Wt 170 lb 6.4 oz (77.3 kg)   LMP 10/07/2011   BMI 29.25 kg/m  General:   Alert,  Well-developed, well-nourished, pleasant and cooperative in NAD Skin:  Intact without significant lesions or rashes. Eyes:  Sclera clear, no icterus.   Conjunctiva pink. Ears:  Normal auditory acuity. Nose:  No deformity, discharge,  or lesions. Mouth:  No deformity or lesions. Neck:  Supple; no masses or thyromegaly. No significant cervical adenopathy. Lungs:  Clear throughout to auscultation.   No  wheezes, crackles, or rhonchi. No acute  distress. Heart:  Regular rate and rhythm; no murmurs, clicks, rubs,  or gallops. Abdomen: Non-distended, normal bowel sounds.  Soft and nontender without appreciable mass or hepatosplenomegaly.  Pulses:  Normal pulses noted. Extremities:  Without clubbing or edema.  Impression/Plan: Very pleasant 56 year old lady with longstanding eating disorder more recently phenotypically consistent with bulimia nervosa.  Steady improvement with ongoing cognitive behavioral therapy.  She has known GERD/reflux esophagitis.  No doubt, exacerbated by episodes of vomiting.  No alarm symptoms.  No substantial acid suppression regimen.  Recurring episodic NSAID use.  Recommendations:  No need for further diagnostic studies at this time.  Trial of Aciphex or rabeprazole 20 mg once daily in the morning before breakfast for GERD.  Dispense 30 with 11 refills  Only use NSAIDs like Aleve when necessary  Absolutely continue cognitive behavioral therapy as it is of great value for you  If Aciphex works well for you you can probably taper off the famotidine/Pepcid and Carafate  Office visit here in 8 weeks  If you have any interim issues with that new medication, please call us.  Colonoscopy for screening purposes in 10 years.      Notice: This dictation was prepared with Dragon dictation along with smaller phrase technology. Any transcriptional errors that result from this process are unintentional and may not be corrected upon review.

## 2020-05-15 DIAGNOSIS — F4310 Post-traumatic stress disorder, unspecified: Secondary | ICD-10-CM | POA: Diagnosis not present

## 2020-05-15 DIAGNOSIS — F429 Obsessive-compulsive disorder, unspecified: Secondary | ICD-10-CM | POA: Diagnosis not present

## 2020-05-15 DIAGNOSIS — F509 Eating disorder, unspecified: Secondary | ICD-10-CM | POA: Diagnosis not present

## 2020-05-15 DIAGNOSIS — F319 Bipolar disorder, unspecified: Secondary | ICD-10-CM | POA: Diagnosis not present

## 2020-05-17 ENCOUNTER — Encounter: Payer: Self-pay | Admitting: Nurse Practitioner

## 2020-05-17 ENCOUNTER — Ambulatory Visit: Payer: Medicare PPO | Admitting: Nurse Practitioner

## 2020-05-17 ENCOUNTER — Other Ambulatory Visit: Payer: Self-pay

## 2020-05-17 DIAGNOSIS — J069 Acute upper respiratory infection, unspecified: Secondary | ICD-10-CM | POA: Diagnosis not present

## 2020-05-17 MED ORDER — AZITHROMYCIN 250 MG PO TABS
ORAL_TABLET | ORAL | 0 refills | Status: DC
Start: 2020-05-17 — End: 2020-06-13

## 2020-05-17 MED ORDER — PROMETHAZINE-DM 6.25-15 MG/5ML PO SYRP: 5.0000 mL | ORAL_SOLUTION | Freq: Four times a day (QID) | ORAL | 0 refills | Status: DC | PRN

## 2020-05-17 NOTE — Assessment & Plan Note (Signed)
-  negative home COVID test -Rx. z-pack -Rx. Promethazine-DM cough syrup

## 2020-05-17 NOTE — Progress Notes (Signed)
Acute Office Visit  Subjective:    Patient ID: Jennifer [name], female    DOB: 24-Jul-1965, 55 y.o.   MRN: [id number]  Chief Complaint  Patient presents with  . Cough    X 1 week; chest congestion.   . Nasal Congestion    X 1 week     HPI Patient is in today for sick visit. Symptoms per ROS. She has been using dayquil and afrin, and those have been helping. Past Medical History:  Diagnosis Date  . Abdominal pain, epigastric 10/07/2018  . Acute non-recurrent maxillary sinusitis 11/07/2019  . Anxiety   . Binge-eating and purging type anorexia nervosa   . Bipolar affective disorder (HCC) 12/04/2015  . Bipolar affective disorder, current episode manic without psychotic symptoms (HCC) 12/04/2015  . Bipolar disorder (HCC)   . Cellulitis and abscess of buttock 02/15/2013  . Cystitis, interstitial   . Depression   . Depression    Phreesia 01/28/2020  . Esophageal polyp    about 20 years ago  . GERD (gastroesophageal reflux disease)   . Left wrist pain 03/22/2019  . OCD (obsessive compulsive disorder)   . Palpitations 05/25/2014  . Rectal pain 12/06/2018  . Rib pain on right side 01/31/2019  . Rosacea 03/27/2019  . Sprain of temporomandibular joint or ligament 01/10/2019    Past Surgical History:  Procedure Laterality Date  . BLADDER SURGERY    . CHOLECYSTECTOMY    . COLONOSCOPY WITH PROPOFOL N/A 10/05/2019   Procedure: COLONOSCOPY WITH PROPOFOL;  Surgeon: Corbin Ade, MD;  [facility]: [facility];  Service: Endoscopy;  Laterality: N/A;  12:45pm  . COSMETIC SURGERY N/A    Phreesia 01/28/2020  . ESOPHAGOGASTRODUODENOSCOPY  09/28/2016   [facility]; Dr. Marca Ancona; erosions in the esophagus, 5 cm hiatal hernia, nonbleeding erosive gastropathy s/p biopsied, normal duodenum.  Path with chronic inactive gastritis, no H. pylori or intestinal metaplasia.   Marland Kitchen ESOPHAGOGASTRODUODENOSCOPY (EGD) WITH PROPOFOL N/A 10/17/2018   Dr. Jena Gauss: mild reflux esophagitis, small hiatal hernia  . POLYPECTOMY   10/05/2019   Procedure: POLYPECTOMY;  Surgeon: Corbin Ade, MD;  Location: [facility];  Service: Endoscopy;;  . VOCAL CORD LATERALIZATION, ENDOSCOPIC APPROACH W/ MLB      Family History  Problem Relation Age of Onset  . Healthy Mother   . Healthy Father   . Colon cancer Neg Hx   . Colon polyps Neg Hx     Social History   Socioeconomic History  . Marital status: Married    Spouse name: Tawanna Cooler  . Number of children: 0  . Years of education: Not on file  . Highest education level: Not on file  Occupational History  . Not on file  Tobacco Use  . Smoking status: Current Every Day Smoker    Packs/day: 1.00    Years: 20.00    Pack years: 20.00    Types: Cigarettes    Start date: 01/20/1988  . Smokeless tobacco: Never Used  Vaping Use  . Vaping Use: Never used  Substance and Sexual Activity  . Alcohol use: No    Alcohol/week: 0.0 standard drinks  . Drug use: No  . Sexual activity: Yes    Birth control/protection: None  Other Topics Concern  . 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 Difficulty of Paying Living Expenses: Not hard at all  Food Insecurity: No Food Insecurity  . Worried About Charity fundraiser in the Last Year: Never true  . Ran Out of Food in the Last Year: Never true  Transportation Needs: No Transportation Needs  . Lack of Transportation (Medical): No  . Lack of Transportation (Non-Medical): No  Physical Activity: Inactive  . Days of Exercise per Week: 0 days  . Minutes of Exercise per Session: 0 min  Stress: No  Stress Concern Present  . Feeling of Stress : Only a little  Social Connections: Moderately Isolated  . Frequency of Communication with Friends and Family: Once a week  . Frequency of Social Gatherings with Friends and Family: Once a week  . Attends Religious Services: More than 4 times per year  . Active Member of Clubs or Organizations: No  . Attends Archivist Meetings: Never  . Marital Status: Married  Human resources officer Violence: Not At Risk  . Fear of Current or Ex-Partner: No  . Emotionally Abused: No  . Physically Abused: No  . Sexually Abused: No    Outpatient Medications Prior to Visit  Medication Sig Dispense Refill  . acetaminophen (TYLENOL) 500 MG tablet Take 1,000 mg by mouth daily as needed for fever.     . Brexpiprazole ([name]) 4 MG TABS Take 4 mg by mouth in the morning. 30 tablet 11  . celecoxib (CELEBREX) 200 MG capsule Take 200 mg by mouth daily.    . CHLORPHENIRAMINE MALEATE PO Take by mouth. Once a day as needed for allergies    . clonazePAM (KLONOPIN) 0.5 MG tablet Take 1 tablet (0.5 mg total) by mouth 2 (two) times daily as needed for anxiety. 20 tablet 5  . clotrimazole-betamethasone (LOTRISONE) cream Apply to affected area 2 times daily prn 15 g 0  . cyclobenzaprine (FLEXERIL) 5 MG tablet Take one tablet by mouth at bedtime, as needed, for neck spasm 20 tablet 0  . cycloSPORINE (RESTASIS) 0.05 % ophthalmic emulsion Place 1 drop into both eyes 2 (two) times daily.     Marland Kitchen dicyclomine (BENTYL) 20 MG tablet Take 1 tablet (20 mg total) by mouth 3 (three) times daily as needed for spasms (abdominal cramping). 20 tablet 0  . famotidine (PEPCID) 40 MG tablet Take 1 tablet (40 mg total) by mouth 2 (two) times daily. For 12 weeks, then go back to $Remov'20mg'YfhQNM$  twice daily. (Patient taking differently: Take 20 mg by mouth 2 (two) times daily.) 60 tablet 3  . fluvoxaMINE (LUVOX) 100 MG tablet Take 3 tablets (300 mg total) by mouth at bedtime. 270 tablet 1  . gabapentin  (NEURONTIN) 600 MG tablet Take 2 tablets (1,200 mg total) by mouth 2 (two) times daily. 120 tablet 5  . hydrOXYzine (ATARAX/VISTARIL) 25 MG tablet Take 1 tablet (25 mg total) by mouth 3 (three) times daily as needed. 60 tablet 1  . hyoscyamine (LEVSIN) 0.125 MG tablet Take 1 tablet (0.125 mg total) by mouth every 4 (four) hours as needed (diarrhea). 120 tablet 1  .  loratadine (CLARITIN) 10 MG tablet Take 5 mg by mouth daily as needed for allergies.     . naproxen sodium (ALEVE) 220 MG tablet Take 440-660 mg by mouth daily as needed (Pt takes with Nurtec as needed.).     Marland Kitchen olopatadine (PATANOL) 0.1 % ophthalmic solution Place 1 drop into both eyes daily as needed for allergies.     Marland Kitchen ondansetron (ZOFRAN-ODT) 4 MG disintegrating tablet TAKE ONE TABLET ($RemoveBef'4MG'wXQIGwfLQq$  TOTAL) BY MOUTH EVERY 8 HOURS AS NEEDED FOR NAUSEA OR VOMITING 30 tablet 1  . RABEprazole (ACIPHEX) 20 MG tablet Take 1 tablet (20 mg total) by mouth daily. 30 tablet 11  . Rimegepant Sulfate (NURTEC) 75 MG TBDP Take 75 mg by mouth daily as needed (take for abortive therapy of migraine, no more than 1 tablet in 24 hours or 10 per month). 10 tablet 11  . sodium chloride (OCEAN NASAL SPRAY) 0.65 % nasal spray Place 1 spray into the nose as needed for congestion. 30 mL 12  . sucralfate (CARAFATE) 1 g tablet Take 1 tablet (1 g total) by mouth 3 (three) times daily as needed. 30 tablet 3  . traZODone (DESYREL) 100 MG tablet Take 1-2 tablets (100-200 mg total) by mouth at bedtime as needed for sleep. 60 tablet 11  . valACYclovir (VALTREX) 500 MG tablet Take 500 mg by mouth daily as needed (Flair up take Twice a day  for 3 days and stop).     . fluticasone (FLONASE) 50 MCG/ACT nasal spray Place 1 spray into both nostrils daily for 14 days. 16 g 0   No facility-administered medications prior to visit.    Allergies  Allergen Reactions  . Codeine Shortness Of Breath, Nausea Only and Rash  . Penicillins Hives, Shortness Of Breath, Swelling and Rash    Has  patient had a PCN reaction causing immediate rash, facial/tongue/throat swelling, SOB or lightheadedness with hypotension: yes Has patient had a PCN reaction causing severe rash involving mucus membranes or skin necrosis: no Has patient had a PCN reaction that required hospitalization: no Has patient had a PCN reaction occurring within the last 10 years: no If all of the above answers are "NO", then may proceed with Cephalosporin use.;     . Divalproex Sodium Hives, Itching and Rash  . Meloxicam Other (See Comments)    Possible chest tightness - instructed by MD not to take  . Sonata [Zaleplon] Other (See Comments)    Hallucinations  . Topamax [Topiramate] Other (See Comments)    Low BP and dizziness  . Aimovig [Erenumab-Aooe] Itching  . Galcanezumab-Gnlm Rash and Hives    Review of Systems  Constitutional: Negative for chills, fatigue and fever.  HENT: Positive for congestion and sinus pressure. Negative for sinus pain and sore throat.   Respiratory: Positive for cough. Negative for chest tightness, shortness of breath and wheezing.   Cardiovascular: Negative.        Objective:    Physical Exam  LMP 10/07/2011  Wt Readings from Last 3 Encounters:  05/14/20 170 lb 6.4 oz (77.3 kg)  03/29/20 169 lb 6.4 oz (76.8 kg)  03/28/20 170 lb (77.1 kg)    Health Maintenance Due  Topic Date Due  . HIV Screening  Never done  . TETANUS/TDAP  Never done  . PAP SMEAR-Modifier  05/23/2016    There are no preventive care reminders to display for this patient.   Lab Results  Component Value Date   TSH 0.938 03/26/2020   Lab Results  Component Value Date   WBC 7.6 03/26/2020   HGB 14.8 03/26/2020   HCT 43.1 03/26/2020   MCV 88 03/26/2020   PLT 187 03/20/2019   Lab Results  Component Value Date   NA 139 03/26/2020   K 4.3 03/26/2020   CO2 22 03/26/2020   GLUCOSE 111 (H) 03/26/2020   BUN 16 03/26/2020   CREATININE 0.94 03/26/2020   BILITOT 0.4 03/26/2020   ALKPHOS 97  03/26/2020   AST 26 03/26/2020   ALT 24 03/26/2020   PROT 7.0 03/26/2020   ALBUMIN 4.5 03/26/2020   CALCIUM 9.7 03/26/2020   ANIONGAP 7 03/20/2019   EGFR 72 03/26/2020   Lab Results  Component Value Date   CHOL 158 03/26/2020   Lab Results  Component Value Date   HDL 55 03/26/2020   Lab Results  Component Value Date   LDLCALC 90 03/26/2020   Lab Results  Component Value Date   TRIG 65 03/26/2020   Lab Results  Component Value Date   CHOLHDL 2.9 03/26/2020   Lab Results  Component Value Date   HGBA1C 5.7 (H) 03/26/2020       Assessment & Plan:   Problem List Items Addressed This Visit      Respiratory   URI (upper respiratory infection)    -negative home COVID test -Rx. z-pack -Rx. Promethazine-DM cough syrup      Relevant Medications   azithromycin (ZITHROMAX) 250 MG tablet       Meds ordered this encounter  Medications  . azithromycin (ZITHROMAX) 250 MG tablet    Sig: Please dispense as a z-pack    Dispense:  6 tablet    Refill:  0  . promethazine-dextromethorphan (PROMETHAZINE-DM) 6.25-15 MG/5ML syrup    Sig: Take 5 mLs by mouth 4 (four) times daily as needed for cough.    Dispense:  118 mL    Refill:  0   Date:  05/17/2020   Location of Patient: Home Location of Provider: Office Consent was obtain for visit to be over via telehealth. I verified that I am speaking with the correct person using two identifiers.  I connected with  ANNABELLE REXROAD on 05/17/20 via telephone and verified that I am speaking with the correct person using two identifiers.   I discussed the limitations of evaluation and management by telemedicine. The patient expressed understanding and agreed to proceed.  Time spent: 8 minutes   Noreene Larsson, NP

## 2020-05-21 ENCOUNTER — Encounter: Payer: Self-pay | Admitting: Physician Assistant

## 2020-05-21 ENCOUNTER — Telehealth (INDEPENDENT_AMBULATORY_CARE_PROVIDER_SITE_OTHER): Payer: Medicare PPO | Admitting: Physician Assistant

## 2020-05-21 DIAGNOSIS — F17200 Nicotine dependence, unspecified, uncomplicated: Secondary | ICD-10-CM

## 2020-05-21 DIAGNOSIS — F411 Generalized anxiety disorder: Secondary | ICD-10-CM | POA: Diagnosis not present

## 2020-05-21 DIAGNOSIS — F509 Eating disorder, unspecified: Secondary | ICD-10-CM | POA: Diagnosis not present

## 2020-05-21 DIAGNOSIS — G4700 Insomnia, unspecified: Secondary | ICD-10-CM

## 2020-05-21 DIAGNOSIS — F429 Obsessive-compulsive disorder, unspecified: Secondary | ICD-10-CM

## 2020-05-21 DIAGNOSIS — F319 Bipolar disorder, unspecified: Secondary | ICD-10-CM | POA: Diagnosis not present

## 2020-05-21 MED ORDER — CLONAZEPAM 0.5 MG PO TABS: 0.5000 mg | ORAL_TABLET | Freq: Two times a day (BID) | ORAL | 5 refills | Status: DC | PRN

## 2020-05-21 MED ORDER — HYDROXYZINE HCL 25 MG PO TABS: 25.0000 mg | ORAL_TABLET | Freq: Three times a day (TID) | ORAL | 11 refills | Status: DC | PRN

## 2020-05-21 MED ORDER — FLUVOXAMINE MALEATE 100 MG PO TABS: 300.0000 mg | ORAL_TABLET | Freq: Every day | ORAL | 3 refills | Status: DC

## 2020-05-21 NOTE — Progress Notes (Signed)
[facility] Check  Patient ID: Jennifer [name],  MRN: [id number]  PCP: Fayrene Helper, MD  Date of Evaluation: 05/21/2020 Time spent:40 minutes  Chief Complaint:  Chief Complaint    Anxiety; Depression; Insomnia; Follow-up       Virtual Visit via Telehealth  I connected with patient by a video enabled telemedicine application with their informed consent, and verified patient privacy and that I am speaking with the correct person using two identifiers.  I am private, in my office and the patient is at home.  I discussed the limitations, risks, security and privacy concerns of performing an evaluation and management service by video and the availability of in person appointments. I also discussed with the patient that there may be a patient responsible charge related to this service. The patient expressed understanding and agreed to proceed.   I discussed the assessment and treatment plan with the patient. The patient was provided an opportunity to ask questions and all were answered. The patient agreed with the plan and demonstrated an understanding of the instructions.   The patient was advised to call back or seek an in-person evaluation if the symptoms worsen or if the condition fails to improve as anticipated.  I provided 40  minutes of non-face-to-face time during this encounter.   HISTORY/CURRENT STATUS: HPI For routine med check.  Hasn't binged and burged as much, has been in DBT which has helped.  Has purged once in the past couple of days and only twice last week.  She normally purged every evening, or at least she has for a while.  She had 1 day a week or so ago where she was really depressed for about 6 or 7 hours.  She was sick with a respiratory infection and thinks the antibiotic or other treatment may have led to the worsening depression.  She was able to get through it by using her action plan she has with her therapist.  She talked with a friend, took a  walk, worked on some sewing.  All of that helped.  For the most part she has been doing well.  Is able to enjoy things.  Energy and motivation are good.  Not isolating.  She does cry sometimes but nothing like she used to.  Appetite is good and weight is stable.  Denies suicidal or homicidal thoughts.  Patient denies increased energy with decreased need for sleep, no increased talkativeness, no racing thoughts, no impulsivity or risky behaviors, no increased spending, no increased libido, no grandiosity, no increased irritability or anger, and no hallucinations.  Anxiety and OCD symptoms are controlled.  She does take the hydroxyzine for the anxiety at times, and the Klonopin is even more rare.  She is also now taking the hydroxyzine for allergies or has done it a couple of times.  It is helpful.  Denies dizziness, syncope, seizures, numbness, tingling, tremor, tics, unsteady gait, slurred speech, confusion. Denies muscle or joint pain, stiffness, or dystonia.  Individual Medical History/ Review of Systems: Changes? :Yes  states her hemoglobin A1c is 5.7, she is going to work on losing weight so it will not turn into a full blown diabetes.  Past medications for mental health diagnoses include: Risperdal, Seroquel, Prozac, Zoloft, Wellbutrin, Lamictal, Depakote caused hair loss,Xanax, Ambien, trazodone, Trileptal, Luvox, Topamax, Elavil, Pamelor, BuSpar, doxazosin, prazosin, lithium, Vraylar, Abilify, [name], Latuda >60 mg caused abd pain and nausea  Allergies: Codeine, Penicillins, Divalproex sodium, Meloxicam, Sonata [zaleplon], Topamax [topiramate], Aimovig [erenumab-aooe], and Galcanezumab-gnlm  Current Medications:  Current Outpatient Medications:  .  Brexpiprazole ([name]) 4 MG TABS, Take 4 mg by mouth in the morning., Disp: 30 tablet, Rfl: 11 .  celecoxib (CELEBREX) 200 MG capsule, Take 200 mg by mouth daily., Disp: , Rfl:  .  CHLORPHENIRAMINE MALEATE PO, Take by mouth. Once a day as  needed for allergies, Disp: , Rfl:  .  clotrimazole-betamethasone (LOTRISONE) cream, Apply to affected area 2 times daily prn, Disp: 15 g, Rfl: 0 .  cyclobenzaprine (FLEXERIL) 5 MG tablet, Take one tablet by mouth at bedtime, as needed, for neck spasm, Disp: 20 tablet, Rfl: 0 .  cycloSPORINE (RESTASIS) 0.05 % ophthalmic emulsion, Place 1 drop into both eyes 2 (two) times daily. , Disp: , Rfl:  .  dicyclomine (BENTYL) 20 MG tablet, Take 1 tablet (20 mg total) by mouth 3 (three) times daily as needed for spasms (abdominal cramping)., Disp: 20 tablet, Rfl: 0 .  famotidine (PEPCID) 40 MG tablet, Take 1 tablet (40 mg total) by mouth 2 (two) times daily. For 12 weeks, then go back to 20mg  twice daily., Disp: 60 tablet, Rfl: 3 .  gabapentin (NEURONTIN) 600 MG tablet, Take 2 tablets (1,200 mg total) by mouth 2 (two) times daily., Disp: 120 tablet, Rfl: 5 .  loratadine (CLARITIN) 10 MG tablet, Take 5 mg by mouth daily as needed for allergies. , Disp: , Rfl:  .  naproxen sodium (ALEVE) 220 MG tablet, Take 440-660 mg by mouth daily as needed (Pt takes with Nurtec as needed.). , Disp: , Rfl:  .  olopatadine (PATANOL) 0.1 % ophthalmic solution, Place 1 drop into both eyes daily as needed for allergies. , Disp: , Rfl:  .  ondansetron (ZOFRAN-ODT) 4 MG disintegrating tablet, TAKE ONE TABLET (4MG  TOTAL) BY MOUTH EVERY 8 HOURS AS NEEDED FOR NAUSEA OR VOMITING, Disp: 30 tablet, Rfl: 1 .  Rimegepant Sulfate (NURTEC) 75 MG TBDP, Take 75 mg by mouth daily as needed (take for abortive therapy of migraine, no more than 1 tablet in 24 hours or 10 per month)., Disp: 10 tablet, Rfl: 11 .  sodium chloride (OCEAN NASAL SPRAY) 0.65 % nasal spray, Place 1 spray into the nose as needed for congestion., Disp: 30 mL, Rfl: 12 .  sucralfate (CARAFATE) 1 g tablet, Take 1 tablet (1 g total) by mouth 3 (three) times daily as needed., Disp: 30 tablet, Rfl: 3 .  traZODone (DESYREL) 100 MG tablet, Take 1-2 tablets (100-200 mg total) by mouth  at bedtime as needed for sleep., Disp: 60 tablet, Rfl: 11 .  valACYclovir (VALTREX) 500 MG tablet, Take 500 mg by mouth daily as needed (Flair up take Twice a day  for 3 days and stop). , Disp: , Rfl:  .  acetaminophen (TYLENOL) 500 MG tablet, Take 1,000 mg by mouth daily as needed for fever. , Disp: , Rfl:  .  azithromycin (ZITHROMAX) 250 MG tablet, Please dispense as a z-pack, Disp: 6 tablet, Rfl: 0 .  clonazePAM (KLONOPIN) 0.5 MG tablet, Take 1 tablet (0.5 mg total) by mouth 2 (two) times daily as needed for anxiety., Disp: 20 tablet, Rfl: 5 .  fluticasone (FLONASE) 50 MCG/ACT nasal spray, Place 1 spray into both nostrils daily for 14 days., Disp: 16 g, Rfl: 0 .  fluvoxaMINE (LUVOX) 100 MG tablet, Take 3 tablets (300 mg total) by mouth at bedtime., Disp: 270 tablet, Rfl: 3 .  hydrOXYzine (ATARAX/VISTARIL) 25 MG tablet, Take 1 tablet (25 mg total) by mouth 3 (three) times daily  as needed., Disp: 60 tablet, Rfl: 11 .  hyoscyamine (LEVSIN) 0.125 MG tablet, Take 1 tablet (0.125 mg total) by mouth every 4 (four) hours as needed (diarrhea). (Patient not taking: Reported on 05/21/2020), Disp: 120 tablet, Rfl: 1 .  promethazine-dextromethorphan (PROMETHAZINE-DM) 6.25-15 MG/5ML syrup, Take 5 mLs by mouth 4 (four) times daily as needed for cough. (Patient not taking: Reported on 05/21/2020), Disp: 118 mL, Rfl: 0 .  RABEprazole (ACIPHEX) 20 MG tablet, Take 1 tablet (20 mg total) by mouth daily. (Patient not taking: Reported on 05/21/2020), Disp: 30 tablet, Rfl: 11 Medication Side Effects: none  Family Medical/ Social History: Changes? No  MENTAL HEALTH EXAM:  Last menstrual period 10/07/2011.There is no height or weight on file to calculate BMI.  General Appearance: Casual and Well Groomed  Eye Contact:  Good  Speech:  Clear and Coherent and Normal Rate  Volume:  Normal  Mood:  Euthymic  Affect:  Appropriate  Thought Process:  Goal Directed and Descriptions of Associations: Circumstantial  Orientation:   Full (Time, Place, and Person)  Thought Content: Logical   Suicidal Thoughts:  No  Homicidal Thoughts:  No  Memory:  WNL  Judgement:  Good  Insight:  Good  Psychomotor Activity:  Normal  Concentration:  Concentration: Good  Recall:  Good  Fund of Knowledge: Good  Language: Good  Assets:  Desire for Improvement  ADL's:  Intact  Cognition: WNL  Prognosis:  Good   Labs on 03/26/2020 CBC with differential normal CMP glucose 111 otherwise completely normal Lipid panel normal Hemoglobin A1c 5.7 Magnesium normal TSH normal Vitamin D normal  DIAGNOSES:    ICD-10-CM   1. Obsessive-compulsive disorder, unspecified type  F42.9   2. Bipolar I disorder ([place])  F31.9   3. Eating disorder, unspecified type  F50.9   4. Generalized anxiety disorder  F41.1   5. Insomnia, unspecified type  G47.00   6. Smoker  F17.200     Receiving Psychotherapy: Yes [facility]   RECOMMENDATIONS:  PDMP was reviewed. I provided 40 minutes of nonface-to-face time during this encounter, including time spent before and after the visit in records review and charting. I am glad to see her doing so well for the most part.   She will continue to work with her therapist in hopes of stopping the cycle of binging and purging. Smoking cessation was discussed. Sleep hygiene briefly discussed. Get more exercise if possible even walking 15 minutes a day would be helpful in preventing diabetes and just making her feel better in general. Continue [name] 4 mg, 1 p.o. daily. Continue Klonopin 0.5 mg, 1 p.o. twice daily as needed. Continue Luvox 100 mg, 3 p.o. nightly. Continue gabapentin 600 mg, 2 p.o. twice daily. Continue hydroxyzine 25 mg, 1 p.o. 3 times daily as needed. Continue trazodone 100 mg, 1-2 nightly as needed sleep. Continue therapy. Return in 3 months.  [name], PA-C

## 2020-05-22 ENCOUNTER — Telehealth: Payer: Self-pay

## 2020-05-22 DIAGNOSIS — F509 Eating disorder, unspecified: Secondary | ICD-10-CM | POA: Diagnosis not present

## 2020-05-22 DIAGNOSIS — F429 Obsessive-compulsive disorder, unspecified: Secondary | ICD-10-CM | POA: Diagnosis not present

## 2020-05-22 DIAGNOSIS — F319 Bipolar disorder, unspecified: Secondary | ICD-10-CM | POA: Diagnosis not present

## 2020-05-22 DIAGNOSIS — F4310 Post-traumatic stress disorder, unspecified: Secondary | ICD-10-CM | POA: Diagnosis not present

## 2020-05-22 NOTE — Telephone Encounter (Signed)
Pt called and states she Started Rabeprazole daily as directed on 05/14/2020. Pt noticed swelling in her hands and face Monday May 20, 2020. Pt d/c medication on Yesterday May 3, 20222. Please advise.

## 2020-05-23 ENCOUNTER — Ambulatory Visit: Payer: Medicare PPO | Admitting: Family Medicine

## 2020-05-23 NOTE — Telephone Encounter (Signed)
Seems like she is intolerant to most PPIs.  Agree with stopping the medication. Recommend Pepcid Complete 1 to 2 tablets twice daily for reflux symptoms.  Follow-up with Neil Crouch in 6 weeks

## 2020-05-24 NOTE — Telephone Encounter (Signed)
Spoke with pt. Pt was notified of recommendations of Pepcid Complete 1-2 tabs twice daily for reflux symptoms. Pt will continue to hold off on other PPIs. Pt has follow up.

## 2020-05-29 DIAGNOSIS — F4310 Post-traumatic stress disorder, unspecified: Secondary | ICD-10-CM | POA: Diagnosis not present

## 2020-05-29 DIAGNOSIS — F429 Obsessive-compulsive disorder, unspecified: Secondary | ICD-10-CM | POA: Diagnosis not present

## 2020-05-29 DIAGNOSIS — F502 Bulimia nervosa: Secondary | ICD-10-CM | POA: Diagnosis not present

## 2020-05-29 DIAGNOSIS — F3131 Bipolar disorder, current episode depressed, mild: Secondary | ICD-10-CM | POA: Diagnosis not present

## 2020-06-05 DIAGNOSIS — F502 Bulimia nervosa: Secondary | ICD-10-CM | POA: Diagnosis not present

## 2020-06-05 DIAGNOSIS — F4310 Post-traumatic stress disorder, unspecified: Secondary | ICD-10-CM | POA: Diagnosis not present

## 2020-06-05 DIAGNOSIS — F3131 Bipolar disorder, current episode depressed, mild: Secondary | ICD-10-CM | POA: Diagnosis not present

## 2020-06-05 DIAGNOSIS — F429 Obsessive-compulsive disorder, unspecified: Secondary | ICD-10-CM | POA: Diagnosis not present

## 2020-06-12 DIAGNOSIS — F429 Obsessive-compulsive disorder, unspecified: Secondary | ICD-10-CM | POA: Diagnosis not present

## 2020-06-12 DIAGNOSIS — F502 Bulimia nervosa: Secondary | ICD-10-CM | POA: Diagnosis not present

## 2020-06-12 DIAGNOSIS — F4310 Post-traumatic stress disorder, unspecified: Secondary | ICD-10-CM | POA: Diagnosis not present

## 2020-06-12 DIAGNOSIS — F3131 Bipolar disorder, current episode depressed, mild: Secondary | ICD-10-CM | POA: Diagnosis not present

## 2020-06-13 ENCOUNTER — Other Ambulatory Visit: Payer: Self-pay

## 2020-06-13 ENCOUNTER — Ambulatory Visit: Payer: Medicare PPO | Admitting: Nurse Practitioner

## 2020-06-13 ENCOUNTER — Encounter: Payer: Self-pay | Admitting: Nurse Practitioner

## 2020-06-13 DIAGNOSIS — W57XXXA Bitten or stung by nonvenomous insect and other nonvenomous arthropods, initial encounter: Secondary | ICD-10-CM | POA: Diagnosis not present

## 2020-06-13 DIAGNOSIS — G2581 Restless legs syndrome: Secondary | ICD-10-CM

## 2020-06-13 DIAGNOSIS — S30860A Insect bite (nonvenomous) of lower back and pelvis, initial encounter: Secondary | ICD-10-CM

## 2020-06-13 MED ORDER — DOXYCYCLINE HYCLATE 100 MG PO TABS: 100.0000 mg | ORAL_TABLET | Freq: Two times a day (BID) | ORAL | 0 refills | Status: DC

## 2020-06-13 MED ORDER — ROPINIROLE HCL 2 MG PO TABS: 2.0000 mg | ORAL_TABLET | Freq: Every day | ORAL | 1 refills | Status: DC

## 2020-06-13 NOTE — Assessment & Plan Note (Signed)
-  Rx. requip -may have got worse when she cut back on gabapentin -recheck in 1 month to see if medication is helping -if no improvement, may consider autoimmune panel as blood flow was good today, no swollen joints, and pain is bilateral

## 2020-06-13 NOTE — Assessment & Plan Note (Signed)
-  Rx. doxycycline

## 2020-06-13 NOTE — Progress Notes (Signed)
Acute Office Visit  Subjective:    Patient ID: Jennifer [name], female    DOB: [birth year], 55 y.o.   MRN: [id number]  Chief Complaint  Patient presents with  . Leg Pain    X 2 months, worse at night. Achy feeling.     HPI Patient is in today for leg pain that is worse at night. She describes the pain as achy and uncomfortable has been ongoing or 2 months.  She states the pain is worse in her lower legs from her knee to her ankle. She states that she feels like she needs to move her legs at night to get comfortable.  She was taking high-dose gabapentin for anxiety, but cut back d/t making her forget things and have brain fog.     Tick bite Saturday (06/08/20) to left mid back.  Past Medical History:  Diagnosis Date  . Abdominal pain, epigastric 10/07/2018  . Acute non-recurrent maxillary sinusitis 11/07/2019  . Anxiety   . Binge-eating and purging type anorexia nervosa   . Bipolar affective disorder ([place]) 12/04/2015  . Bipolar affective disorder, current episode manic without psychotic symptoms ([place]) 12/04/2015  . Bipolar disorder ([place])   . Cellulitis and abscess of buttock 02/15/2013  . Cystitis, interstitial   . Depression   . Depression    Phreesia 01/28/2020  . Esophageal polyp    about 20 years ago  . GERD (gastroesophageal reflux disease)   . Left wrist pain 03/22/2019  . OCD (obsessive compulsive disorder)   . Palpitations 05/25/2014  . Rectal pain 12/06/2018  . Rib pain on right side 01/31/2019  . Rosacea 03/27/2019  . Sprain of temporomandibular joint or ligament 01/10/2019    Past Surgical History:  Procedure Laterality Date  . BLADDER SURGERY    . CHOLECYSTECTOMY    . COLONOSCOPY WITH PROPOFOL N/A 10/05/2019   Procedure: COLONOSCOPY WITH PROPOFOL;  Surgeon: Daneil Dolin, MD;  [facility];  Service: Endoscopy;  Laterality: N/A;  12:45pm  . COSMETIC SURGERY N/A    Phreesia 01/28/2020  . ESOPHAGOGASTRODUODENOSCOPY  09/28/2016   [facility]; Dr. Therisa Doyne;  erosions in the esophagus, 5 cm hiatal hernia, nonbleeding erosive gastropathy s/p biopsied, normal duodenum.  Path with chronic inactive gastritis, no H. pylori or intestinal metaplasia.   Marland Kitchen ESOPHAGOGASTRODUODENOSCOPY (EGD) WITH PROPOFOL N/A 10/17/2018   Dr. Gala Romney: mild reflux esophagitis, small hiatal hernia  . POLYPECTOMY  10/05/2019   Procedure: POLYPECTOMY;  Surgeon: Daneil Dolin, MD;  [facility]: [facility];  Service: Endoscopy;;  . VOCAL CORD LATERALIZATION, ENDOSCOPIC APPROACH W/ MLB      Family History  Problem Relation Age of Onset  . Healthy Mother   . Healthy Father   . Colon cancer Neg Hx   . Colon polyps Neg Hx     Social History   Socioeconomic History  . Marital status: Married    Spouse name: [name]  . Number of children: 0  . Years of education: Not on file  . Highest education level: Not on file  Occupational History  . Not on file  Tobacco Use  . Smoking status: Current Every Day Smoker    Packs/day: 1.00    Years: 20.00    Pack years: 20.00    Types: Cigarettes    Start date: 01/20/1988  . Smokeless tobacco: Never Used  Vaping Use  . Vaping Use: Never used  Substance and Sexual Activity  . Alcohol use: No    Alcohol/week: 0.0 standard drinks  .  Drug use: No  . Sexual activity: Yes    Birth control/protection: None  Other Topics Concern  . Not on file  Social History Narrative   Lives with husband -[name] of 42 years       Yorkie- Max      Enjoys: reading-all genres      Diet: eats all food groups   Caffeine: 1 cup daily at times, diet dr pepper daily   Water: gatorade  zero and water       Wears seat belt    Does not use phone while driving   Smoke and Development worker, 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         Social Determinants of Health   Financial Resource Strain: Low Risk   . Difficulty of Paying Living Expenses: Not hard at all  Food Insecurity: No Food Insecurity  . Worried About Charity fundraiser in the  Last Year: Never true  . Ran Out of Food in the Last Year: Never true  Transportation Needs: No Transportation Needs  . Lack of Transportation (Medical): No  . Lack of Transportation (Non-Medical): No  Physical Activity: Inactive  . Days of Exercise per Week: 0 days  . Minutes of Exercise per Session: 0 min  Stress: No Stress Concern Present  . Feeling of Stress : Only a little  Social Connections: Moderately Isolated  . Frequency of Communication with Friends and Family: Once a week  . Frequency of Social Gatherings with Friends and Family: Once a week  . Attends Religious Services: More than 4 times per year  . Active Member of Clubs or Organizations: No  . Attends Archivist Meetings: Never  . Marital Status: Married  Human resources officer Violence: Not At Risk  . Fear of Current or Ex-Partner: No  . Emotionally Abused: No  . Physically Abused: No  . Sexually Abused: No    Outpatient Medications Prior to Visit  Medication Sig Dispense Refill  . acetaminophen (TYLENOL) 500 MG tablet Take 1,000 mg by mouth daily as needed for fever.     . Brexpiprazole ([name]) 4 MG TABS Take 4 mg by mouth in the morning. 30 tablet 11  . celecoxib (CELEBREX) 200 MG capsule Take 200 mg by mouth daily.    . CHLORPHENIRAMINE MALEATE PO Take by mouth. Once a day as needed for allergies    . clonazePAM (KLONOPIN) 0.5 MG tablet Take 1 tablet (0.5 mg total) by mouth 2 (two) times daily as needed for anxiety. 20 tablet 5  . clotrimazole-betamethasone (LOTRISONE) cream Apply to affected area 2 times daily prn 15 g 0  . cycloSPORINE (RESTASIS) 0.05 % ophthalmic emulsion Place 1 drop into both eyes 2 (two) times daily.     Marland Kitchen dicyclomine (BENTYL) 20 MG tablet Take 1 tablet (20 mg total) by mouth 3 (three) times daily as needed for spasms (abdominal cramping). 20 tablet 0  . famotidine (PEPCID) 40 MG tablet Take 1 tablet (40 mg total) by mouth 2 (two) times daily. For 12 weeks, then go back to $Remov'20mg'TMFGhg$  twice  daily. 60 tablet 3  . fluvoxaMINE (LUVOX) 100 MG tablet Take 3 tablets (300 mg total) by mouth at bedtime. 270 tablet 3  . gabapentin (NEURONTIN) 600 MG tablet Take 2 tablets (1,200 mg total) by mouth 2 (two) times daily. 120 tablet 5  . hydrOXYzine (ATARAX/VISTARIL) 25 MG tablet Take 1 tablet (25 mg total) by mouth 3 (three) times daily  as needed. 60 tablet 11  . hyoscyamine (LEVSIN) 0.125 MG tablet Take 1 tablet (0.125 mg total) by mouth every 4 (four) hours as needed (diarrhea). 120 tablet 1  . loratadine (CLARITIN) 10 MG tablet Take 5 mg by mouth daily as needed for allergies.     . naproxen sodium (ALEVE) 220 MG tablet Take 440-660 mg by mouth daily as needed (Pt takes with Nurtec as needed.).     Marland Kitchen olopatadine (PATANOL) 0.1 % ophthalmic solution Place 1 drop into both eyes daily as needed for allergies.     Marland Kitchen ondansetron (ZOFRAN-ODT) 4 MG disintegrating tablet TAKE ONE TABLET ($RemoveBef'4MG'IqiKwXpNJp$  TOTAL) BY MOUTH EVERY 8 HOURS AS NEEDED FOR NAUSEA OR VOMITING 30 tablet 1  . Rimegepant Sulfate (NURTEC) 75 MG TBDP Take 75 mg by mouth daily as needed (take for abortive therapy of migraine, no more than 1 tablet in 24 hours or 10 per month). 10 tablet 11  . sodium chloride (OCEAN NASAL SPRAY) 0.65 % nasal spray Place 1 spray into the nose as needed for congestion. 30 mL 12  . sucralfate (CARAFATE) 1 g tablet Take 1 tablet (1 g total) by mouth 3 (three) times daily as needed. 30 tablet 3  . traZODone (DESYREL) 100 MG tablet Take 1-2 tablets (100-200 mg total) by mouth at bedtime as needed for sleep. 60 tablet 11  . valACYclovir (VALTREX) 500 MG tablet Take 500 mg by mouth daily as needed (Flair up take Twice a day  for 3 days and stop).     . fluticasone (FLONASE) 50 MCG/ACT nasal spray Place 1 spray into both nostrils daily for 14 days. 16 g 0  . azithromycin (ZITHROMAX) 250 MG tablet Please dispense as a z-pack 6 tablet 0  . cyclobenzaprine (FLEXERIL) 5 MG tablet Take one tablet by mouth at bedtime, as needed, for  neck spasm 20 tablet 0  . promethazine-dextromethorphan (PROMETHAZINE-DM) 6.25-15 MG/5ML syrup Take 5 mLs by mouth 4 (four) times daily as needed for cough. (Patient not taking: Reported on 05/21/2020) 118 mL 0  . RABEprazole (ACIPHEX) 20 MG tablet Take 1 tablet (20 mg total) by mouth daily. (Patient not taking: Reported on 05/21/2020) 30 tablet 11   No facility-administered medications prior to visit.    Allergies  Allergen Reactions  . Codeine Shortness Of Breath, Nausea Only and Rash  . Penicillins Hives, Shortness Of Breath, Swelling and Rash    Has patient had a PCN reaction causing immediate rash, facial/tongue/throat swelling, SOB or lightheadedness with hypotension: yes Has patient had a PCN reaction causing severe rash involving mucus membranes or skin necrosis: no Has patient had a PCN reaction that required hospitalization: no Has patient had a PCN reaction occurring within the last 10 years: no If all of the above answers are "NO", then may proceed with Cephalosporin use.;     . Divalproex Sodium Hives, Itching and Rash  . Meloxicam Other (See Comments)    Possible chest tightness - instructed by MD not to take  . Sonata [Zaleplon] Other (See Comments)    Hallucinations  . Topamax [Topiramate] Other (See Comments)    Low BP and dizziness  . Aimovig [Erenumab-Aooe] Itching  . Galcanezumab-Gnlm Rash and Hives    Review of Systems  Constitutional: Negative.   Respiratory: Negative.   Cardiovascular: Negative.   Musculoskeletal:       Bilateral leg aching per HPI  Skin:       Tick biter to left lateral back  Objective:    Physical Exam Constitutional:      Appearance: Normal appearance.  Cardiovascular:     Rate and Rhythm: Normal rate and regular rhythm.     Pulses: Normal pulses.     Heart sounds: Normal heart sounds.     Comments: 2+ pedal pulses with great coloration Pulmonary:     Effort: Pulmonary effort is normal.     Breath sounds: Normal breath  sounds.  Musculoskeletal:        General: No swelling or deformity. Normal range of motion.  Skin:    General: Skin is warm and dry.     Comments: Tick bite to left back; has some peri-bite erythema  Neurological:     Mental Status: She is alert.  Psychiatric:     Comments: Anxious affect     BP 105/72   Pulse 94   Temp 99 F (37.2 C)   Resp 20   Ht 5\' 4"  (1.626 m)   Wt 171 lb (77.6 kg)   LMP 10/07/2011   SpO2 95%   BMI 29.35 kg/m  Wt Readings from Last 3 Encounters:  06/13/20 171 lb (77.6 kg)  05/14/20 170 lb 6.4 oz (77.3 kg)  03/29/20 169 lb 6.4 oz (76.8 kg)    Health Maintenance Due  Topic Date Due  . HIV Screening  Never done  . TETANUS/TDAP  Never done  . PAP SMEAR-Modifier  05/23/2016  . COVID-19 Vaccine (4 - Booster for Moderna series) 02/26/2020    There are no preventive care reminders to display for this patient.   Lab Results  Component Value Date   TSH 0.938 03/26/2020   Lab Results  Component Value Date   WBC 7.6 03/26/2020   HGB 14.8 03/26/2020   HCT 43.1 03/26/2020   MCV 88 03/26/2020   PLT 187 03/20/2019   Lab Results  Component Value Date   NA 139 03/26/2020   K 4.3 03/26/2020   CO2 22 03/26/2020   GLUCOSE 111 (H) 03/26/2020   BUN 16 03/26/2020   CREATININE 0.94 03/26/2020   BILITOT 0.4 03/26/2020   ALKPHOS 97 03/26/2020   AST 26 03/26/2020   ALT 24 03/26/2020   PROT 7.0 03/26/2020   ALBUMIN 4.5 03/26/2020   CALCIUM 9.7 03/26/2020   ANIONGAP 7 03/20/2019   EGFR 72 03/26/2020   Lab Results  Component Value Date   CHOL 158 03/26/2020   Lab Results  Component Value Date   HDL 55 03/26/2020   Lab Results  Component Value Date   LDLCALC 90 03/26/2020   Lab Results  Component Value Date   TRIG 65 03/26/2020   Lab Results  Component Value Date   CHOLHDL 2.9 03/26/2020   Lab Results  Component Value Date   HGBA1C 5.7 (H) 03/26/2020       Assessment & Plan:   Problem List Items Addressed This Visit       Musculoskeletal and Integument   Tick bite of back    -Rx. doxycycline      Relevant Medications   doxycycline (VIBRA-TABS) 100 MG tablet     Other   Restless leg syndrome    -Rx. requip -may have got worse when she cut back on gabapentin -recheck in 1 month to see if medication is helping -if no improvement, may consider autoimmune panel as blood flow was good today, no swollen joints, and pain is bilateral      Relevant Medications   rOPINIRole (REQUIP) 2 MG tablet  Meds ordered this encounter  Medications  . doxycycline (VIBRA-TABS) 100 MG tablet    Sig: Take 1 tablet (100 mg total) by mouth 2 (two) times daily.    Dispense:  20 tablet    Refill:  0  . rOPINIRole (REQUIP) 2 MG tablet    Sig: Take 1 tablet (2 mg total) by mouth at bedtime.    Dispense:  30 tablet    Refill:  [place], NP

## 2020-06-21 DIAGNOSIS — F3131 Bipolar disorder, current episode depressed, mild: Secondary | ICD-10-CM | POA: Diagnosis not present

## 2020-06-21 DIAGNOSIS — F4310 Post-traumatic stress disorder, unspecified: Secondary | ICD-10-CM | POA: Diagnosis not present

## 2020-06-21 DIAGNOSIS — F502 Bulimia nervosa: Secondary | ICD-10-CM | POA: Diagnosis not present

## 2020-06-21 DIAGNOSIS — F429 Obsessive-compulsive disorder, unspecified: Secondary | ICD-10-CM | POA: Diagnosis not present

## 2020-06-26 DIAGNOSIS — F429 Obsessive-compulsive disorder, unspecified: Secondary | ICD-10-CM | POA: Diagnosis not present

## 2020-06-26 DIAGNOSIS — F4310 Post-traumatic stress disorder, unspecified: Secondary | ICD-10-CM | POA: Diagnosis not present

## 2020-06-26 DIAGNOSIS — F3131 Bipolar disorder, current episode depressed, mild: Secondary | ICD-10-CM | POA: Diagnosis not present

## 2020-06-26 DIAGNOSIS — F502 Bulimia nervosa: Secondary | ICD-10-CM | POA: Diagnosis not present

## 2020-06-27 ENCOUNTER — Encounter: Payer: Self-pay | Admitting: Emergency Medicine

## 2020-06-27 ENCOUNTER — Ambulatory Visit
Admission: EM | Admit: 2020-06-27 | Discharge: 2020-06-27 | Disposition: A | Discharge status: Home / Self Care | Payer: Medicare PPO | Attending: Internal Medicine | Admitting: Internal Medicine

## 2020-06-27 ENCOUNTER — Other Ambulatory Visit: Payer: Self-pay

## 2020-06-27 DIAGNOSIS — M549 Dorsalgia, unspecified: Secondary | ICD-10-CM | POA: Diagnosis not present

## 2020-06-27 MED ORDER — KETOROLAC TROMETHAMINE 30 MG/ML IJ SOLN
30.0000 mg | Freq: Once | INTRAMUSCULAR | Status: AC
  Administered 2020-06-27: 20:00:00 30 mg via INTRAMUSCULAR

## 2020-06-27 MED ORDER — IBUPROFEN 600 MG PO TABS
600.0000 mg | ORAL_TABLET | Freq: Three times a day (TID) | ORAL | 0 refills | Status: DC | PRN
Start: 2020-06-27 — End: 2020-06-27

## 2020-06-27 MED ORDER — IBUPROFEN 600 MG PO TABS: 600.0000 mg | ORAL_TABLET | Freq: Three times a day (TID) | ORAL | 0 refills | Status: DC | PRN

## 2020-06-27 NOTE — ED Triage Notes (Signed)
Pt here for lower back pain onset yest  Pain increases w/activity  Denies inj/trauma   A&O x4... NAD.Marland Kitchen. ambulatory

## 2020-06-27 NOTE — Discharge Instructions (Addendum)
Gentle range of motion exercises Heating pad on the 10 minutes on-10 minutes off cycle 4 cycles twice daily Take medications as prescribed Follow-up with your primary care physician Take your Flexeril as needed at bedtime.

## 2020-06-30 NOTE — ED Provider Notes (Signed)
[facility]    CSN: [id number] Arrival date & time: 06/27/20  1901      History   Chief Complaint Chief Complaint  Patient presents with   Back Pain    HPI Jennifer [name] is a 55 y.o. female comes to the urgent care with 1 day history of low back pain.  Onset was sudden.  No fall or trauma.  Patient describes the pain as severe, sharp and aggravated by movement.  No known relieving factors.  Pain does not radiate into the legs.  No numbness or tingling in the lower extremities.  No bowel or bladder difficulties.  No fever or chills.  No dysuria urgency or frequency.   HPI  Past Medical History:  Diagnosis Date   Abdominal pain, epigastric 10/07/2018   Acute non-recurrent maxillary sinusitis 11/07/2019   Anxiety    Binge-eating and purging type anorexia nervosa    Bipolar affective disorder ([place]) 12/04/2015   Bipolar affective disorder, current episode manic without psychotic symptoms ([place]) 12/04/2015   Bipolar disorder ([place])    Cellulitis and abscess of buttock 02/15/2013   Cystitis, interstitial    Depression    Depression    Phreesia 01/28/2020   Esophageal polyp    about 20 years ago   GERD (gastroesophageal reflux disease)    Left wrist pain 03/22/2019   OCD (obsessive compulsive disorder)    Palpitations 05/25/2014   Rectal pain 12/06/2018   Rib pain on right side 01/31/2019   Rosacea 03/27/2019   Sprain of temporomandibular joint or ligament 01/10/2019    Patient Active Problem List   Diagnosis Date Noted   Restless leg syndrome 06/13/2020   Tick bite of back 06/13/2020   URI (upper respiratory infection) 05/17/2020   Neck pain on right side 03/14/2020   Encounter for counseling 11/14/2019   Cough 11/07/2019   Positive colorectal cancer screening using Cologuard test 09/05/2019   Changing skin lesion 06/27/2019   Bulimia 09/12/2018   Gastroesophageal reflux disease 09/12/2018   Migraine without status migrainosus, not intractable 09/12/2018    Generalized anxiety disorder 09/27/2017   Mixed bipolar I disorder ([place]) 12/05/2015   OCD (obsessive compulsive disorder) 12/04/2015   Depression 08/09/1975    Past Surgical History:  Procedure Laterality Date   BLADDER SURGERY     CHOLECYSTECTOMY     COLONOSCOPY WITH PROPOFOL N/A 10/05/2019   Procedure: COLONOSCOPY WITH PROPOFOL;  Surgeon: Daneil Dolin, MD;  [facility];  Service: Endoscopy;  Laterality: N/A;  12:45pm   COSMETIC SURGERY N/A    Phreesia 01/28/2020   ESOPHAGOGASTRODUODENOSCOPY  09/28/2016   [facility]; Dr. Therisa Doyne; erosions in the esophagus, 5 cm hiatal hernia, nonbleeding erosive gastropathy s/p biopsied, normal duodenum.  Path with chronic inactive gastritis, no H. pylori or intestinal metaplasia.    ESOPHAGOGASTRODUODENOSCOPY (EGD) WITH PROPOFOL N/A 10/17/2018   Dr. Gala Romney: mild reflux esophagitis, small hiatal hernia   POLYPECTOMY  10/05/2019   Procedure: POLYPECTOMY;  Surgeon: Daneil Dolin, MD;  [facility];  Service: Endoscopy;;   VOCAL CORD LATERALIZATION, ENDOSCOPIC APPROACH W/ MLB      OB History     Gravida  3   Para      Term      Preterm      AB  3   Living  0      SAB  1   IAB  2   Ectopic      Multiple  Live Births               Home Medications    Prior to Admission medications   Medication Sig Start Date End Date Taking? Authorizing Provider  acetaminophen (TYLENOL) 500 MG tablet Take 1,000 mg by mouth daily as needed for fever.     [provider]  Brexpiprazole ([name]) 4 MG TABS Take 4 mg by mouth in the morning. 01/22/20   [name] T, PA-C  celecoxib (CELEBREX) 200 MG capsule Take 200 mg by mouth daily. 04/29/20   [provider]  CHLORPHENIRAMINE MALEATE PO Take by mouth. Once a day as needed for allergies    [provider]  clonazePAM (KLONOPIN) 0.5 MG tablet Take 1 tablet (0.5 mg total) by mouth 2 (two) times daily as needed for anxiety. 05/21/20   [name]  T, PA-C  clotrimazole-betamethasone (LOTRISONE) cream Apply to affected area 2 times daily prn 02/05/20   Faustino Congress, NP  cycloSPORINE (RESTASIS) 0.05 % ophthalmic emulsion Place 1 drop into both eyes 2 (two) times daily.     [provider]  dicyclomine (BENTYL) 20 MG tablet Take 1 tablet (20 mg total) by mouth 3 (three) times daily as needed for spasms (abdominal cramping). 07/07/18   Long, Wonda Olds, MD  doxycycline (VIBRA-TABS) 100 MG tablet Take 1 tablet (100 mg total) by mouth 2 (two) times daily. 06/13/20   Noreene Larsson, NP  famotidine (PEPCID) 40 MG tablet Take 1 tablet (40 mg total) by mouth 2 (two) times daily. For 12 weeks, then go back to 20mg  twice daily. 05/03/19   Mahala Menghini, PA-C  fluticasone (FLONASE) 50 MCG/ACT nasal spray Place 1 spray into both nostrils daily for 14 days. 02/08/20 02/22/20  Avegno, Darrelyn Hillock, FNP  fluvoxaMINE (LUVOX) 100 MG tablet Take 3 tablets (300 mg total) by mouth at bedtime. 05/21/20   [name] T, PA-C  gabapentin (NEURONTIN) 600 MG tablet Take 2 tablets (1,200 mg total) by mouth 2 (two) times daily. 11/28/19   [name] T, PA-C  hydrOXYzine (ATARAX/VISTARIL) 25 MG tablet Take 1 tablet (25 mg total) by mouth 3 (three) times daily as needed. 05/21/20   Addison Lank, PA-C  hyoscyamine (LEVSIN) 0.125 MG tablet Take 1 tablet (0.125 mg total) by mouth every 4 (four) hours as needed (diarrhea). 05/03/19   Mahala Menghini, PA-C  ibuprofen (ADVIL) 600 MG tablet Take 1 tablet (600 mg total) by mouth every 8 (eight) hours as needed. 06/27/20   Chase Picket, MD  loratadine (CLARITIN) 10 MG tablet Take 5 mg by mouth daily as needed for allergies.     [provider]  olopatadine (PATANOL) 0.1 % ophthalmic solution Place 1 drop into both eyes daily as needed for allergies.     [provider]  ondansetron (ZOFRAN-ODT) 4 MG disintegrating tablet TAKE ONE TABLET (4MG  TOTAL) BY MOUTH EVERY 8 HOURS AS NEEDED FOR NAUSEA OR VOMITING  10/17/19   Lomax, Amy, NP  Rimegepant Sulfate (NURTEC) 75 MG TBDP Take 75 mg by mouth daily as needed (take for abortive therapy of migraine, no more than 1 tablet in 24 hours or 10 per month). 03/28/20   Lomax, Amy, NP  rOPINIRole (REQUIP) 2 MG tablet Take 1 tablet (2 mg total) by mouth at bedtime. 06/13/20   Noreene Larsson, NP  sodium chloride (OCEAN NASAL SPRAY) 0.65 % nasal spray Place 1 spray into the nose as needed for congestion. 02/15/20   Ihor Dow  K, MD  sucralfate (CARAFATE) 1 g tablet Take 1 tablet (1 g total) by mouth 3 (three) times daily as needed. 12/07/19   Carlis Stable, NP  traZODone (DESYREL) 100 MG tablet Take 1-2 tablets (100-200 mg total) by mouth at bedtime as needed for sleep. 01/22/20   Addison Lank, PA-C  valACYclovir (VALTREX) 500 MG tablet Take 500 mg by mouth daily as needed (Flair up take Twice a day  for 3 days and stop).  01/03/18   [provider]    Family History Family History  Problem Relation Age of Onset   Healthy Mother    Healthy Father    Colon cancer Neg Hx    Colon polyps Neg Hx     Social History Social History   Tobacco Use   Smoking status: Every Day    Packs/day: 1.00    Years: 20.00    Pack years: 20.00    Types: Cigarettes    Start date: 01/20/1988   Smokeless tobacco: Never  Vaping Use   Vaping Use: Never used  Substance Use Topics   Alcohol use: No    Alcohol/week: 0.0 standard drinks   Drug use: No     Allergies   Codeine, Penicillins, Divalproex sodium, Meloxicam, Sonata [zaleplon], Topamax [topiramate], Aimovig [erenumab-aooe], and Galcanezumab-gnlm   Review of Systems Review of Systems as per HPI   Physical Exam Triage Vital Signs ED Triage Vitals  Enc Vitals Group     BP 06/27/20 1944 120/79     Pulse Rate 06/27/20 1944 70     Resp 06/27/20 1944 16     Temp 06/27/20 1944 98.7 F (37.1 C)     Temp Source 06/27/20 1944 Oral     SpO2 06/27/20 1944 96 %     Weight --      Height --      Head  Circumference --      Peak Flow --      Pain Score 06/27/20 1945 10     Pain Loc --      Pain Edu? --      Excl. in [place]? --    No data found.  Updated Vital Signs BP 120/79 (BP Location: Right Arm)   Pulse 70   Temp 98.7 F (37.1 C) (Oral)   Resp 16   LMP 10/07/2011   SpO2 96%   Visual Acuity Right Eye Distance:   Left Eye Distance:   Bilateral Distance:    Right Eye Near:   Left Eye Near:    Bilateral Near:     Physical Exam Vitals and nursing note reviewed.  Constitutional:      General: She is in acute distress.     Appearance: Normal appearance. She is not ill-appearing.  Cardiovascular:     Rate and Rhythm: Normal rate and regular rhythm.     Pulses: Normal pulses.     Heart sounds: Normal heart sounds.  Pulmonary:     Effort: Pulmonary effort is normal.     Breath sounds: Normal breath sounds.  Musculoskeletal:        General: Normal range of motion.     Comments: Tenderness on palpation of the sacroiliac joints bilaterally.  No bruising.  Skin:    General: Skin is warm.  Neurological:     Mental Status: She is alert.     UC Treatments / Results  Labs (all labs ordered are listed, but only abnormal results are displayed) Labs Reviewed - No  data to display  EKG   Radiology No results found.  Procedures Procedures (including critical care time)  Medications Ordered in UC Medications  ketorolac (TORADOL) 30 MG/ML injection 30 mg (30 mg Intramuscular Given 06/27/20 2010)    Initial Impression / Assessment and Plan / UC Course  I have reviewed the triage vital signs and the nursing notes.  Pertinent labs & imaging results that were available during my care of the patient were reviewed by me and considered in my medical decision making (see chart for details).     Acute low back pain: Toradol 30 mg IM x1 dose Gentle range of motion exercises Ibuprofen 600 mg every 6 hours as needed for pain Heating pad use Patient already has Flexeril at  home and she is advised to take it at bedtime to help with the back stiffness. Return to urgent care if symptoms worsen Final Clinical Impressions(s) / UC Diagnoses   Final diagnoses:  Acute back pain less than 4 weeks duration     Discharge Instructions      Gentle range of motion exercises Heating pad on the 10 minutes on-10 minutes off cycle 4 cycles twice daily Take medications as prescribed Follow-up with your primary care physician Take your Flexeril as needed at bedtime.   ED Prescriptions     Medication Sig Dispense Auth. Provider   ibuprofen (ADVIL) 600 MG tablet  (Status: Discontinued) Take 1 tablet (600 mg total) by mouth every 8 (eight) hours as needed. 30 tablet Kaena Santori, Myrene Galas, MD   ibuprofen (ADVIL) 600 MG tablet Take 1 tablet (600 mg total) by mouth every 8 (eight) hours as needed. 30 tablet Wealthy Danielski, Myrene Galas, MD      PDMP not reviewed this encounter.   Chase Picket, MD 06/30/20 1135

## 2020-07-01 ENCOUNTER — Other Ambulatory Visit: Payer: Self-pay | Admitting: Physician Assistant

## 2020-07-03 DIAGNOSIS — F429 Obsessive-compulsive disorder, unspecified: Secondary | ICD-10-CM | POA: Diagnosis not present

## 2020-07-03 DIAGNOSIS — F3131 Bipolar disorder, current episode depressed, mild: Secondary | ICD-10-CM | POA: Diagnosis not present

## 2020-07-03 DIAGNOSIS — F4310 Post-traumatic stress disorder, unspecified: Secondary | ICD-10-CM | POA: Diagnosis not present

## 2020-07-03 DIAGNOSIS — F502 Bulimia nervosa: Secondary | ICD-10-CM | POA: Diagnosis not present

## 2020-07-15 ENCOUNTER — Encounter: Payer: Self-pay | Admitting: Nurse Practitioner

## 2020-07-16 ENCOUNTER — Encounter: Payer: Self-pay | Admitting: Internal Medicine

## 2020-07-16 ENCOUNTER — Other Ambulatory Visit: Payer: Self-pay | Admitting: *Deleted

## 2020-07-16 ENCOUNTER — Ambulatory Visit: Payer: Medicare PPO | Admitting: Internal Medicine

## 2020-07-16 ENCOUNTER — Other Ambulatory Visit: Payer: Self-pay

## 2020-07-16 VITALS — BP 121/80 | HR 71 | Temp 98.2°F | Ht 64.0 in | Wt 173.0 lb

## 2020-07-16 DIAGNOSIS — K2100 Gastro-esophageal reflux disease with esophagitis, without bleeding: Secondary | ICD-10-CM

## 2020-07-16 DIAGNOSIS — K219 Gastro-esophageal reflux disease without esophagitis: Secondary | ICD-10-CM | POA: Diagnosis not present

## 2020-07-16 DIAGNOSIS — F502 Bulimia nervosa: Secondary | ICD-10-CM

## 2020-07-16 DIAGNOSIS — R1013 Epigastric pain: Secondary | ICD-10-CM

## 2020-07-16 DIAGNOSIS — R197 Diarrhea, unspecified: Secondary | ICD-10-CM

## 2020-07-16 NOTE — Patient Instructions (Signed)
Continue cognitive behavioral therapy for bulimia.  This is very important and helpful  May use Pepcid or Pepcid Complete in the morning and in the evening as needed for reflux symptoms  May use 1 Imodium daily as needed for constipation  Need serum total IgA level and TTG IgA level drawn in the near future  Office visit in 6 months  Call me if you have any interim issues.

## 2020-07-16 NOTE — Progress Notes (Signed)
Primary Care Physician:  Fayrene Helper, MD Primary Gastroenterologist:  Dr. Gala Romney  Pre-Procedure History & Physical: HPI:  Jennifer [name] is a 55 y.o. female here for follow-up GERD, bulimia nervosa alternating constipation diarrhea.  Continues with cognitive behavioral therapy.  Still has issues with nocturnal food consumption followed by purging.  Intolerant to Aciphex with "swollen joints".  Takes Pepcid in the morning and in the evening on occasion.  Ran out of Carafate.  Does not feel like it worked anyway.  Overall states reflux symptoms have just improved.  No dysphagia.  Prior EGD demonstrated mild reflux esophagitis.  Colonoscopy last year for positive Cologuard yielded only hyperplastic polyps.  She continues with cognitive behavioral therapy. I do not see where she has been screened for celiac disease previously.  Is gained 2 pound since her last office visit. She does have alternating constipation and diarrhea.  No rectal bleeding.  Past Medical History:  Diagnosis Date   Abdominal pain, epigastric 10/07/2018   Acute non-recurrent maxillary sinusitis 11/07/2019   Anxiety    Binge-eating and purging type anorexia nervosa    Bipolar affective disorder ([place]) 12/04/2015   Bipolar affective disorder, current episode manic without psychotic symptoms ([place]) 12/04/2015   Bipolar disorder ([place])    Cellulitis and abscess of buttock 02/15/2013   Cystitis, interstitial    Depression    Depression    Phreesia 01/28/2020   Esophageal polyp    about 20 years ago   GERD (gastroesophageal reflux disease)    Left wrist pain 03/22/2019   OCD (obsessive compulsive disorder)    Palpitations 05/25/2014   Rectal pain 12/06/2018   Rib pain on right side 01/31/2019   Rosacea 03/27/2019   Sprain of temporomandibular joint or ligament 01/10/2019    Past Surgical History:  Procedure Laterality Date   BLADDER SURGERY     CHOLECYSTECTOMY     COLONOSCOPY WITH PROPOFOL N/A 10/05/2019    Procedure: COLONOSCOPY WITH PROPOFOL;  Surgeon: Daneil Dolin, MD;  [facility];  Service: Endoscopy;  Laterality: N/A;  12:45pm   COSMETIC SURGERY N/A    Phreesia 01/28/2020   ESOPHAGOGASTRODUODENOSCOPY  09/28/2016   [facility]; Dr. Therisa Doyne; erosions in the esophagus, 5 cm hiatal hernia, nonbleeding erosive gastropathy s/p biopsied, normal duodenum.  Path with chronic inactive gastritis, no H. pylori or intestinal metaplasia.    ESOPHAGOGASTRODUODENOSCOPY (EGD) WITH PROPOFOL N/A 10/17/2018   Dr. Gala Romney: mild reflux esophagitis, small hiatal hernia   POLYPECTOMY  10/05/2019   Procedure: POLYPECTOMY;  Surgeon: Daneil Dolin, MD;  Location: [facility];  Service: Endoscopy;;   VOCAL CORD LATERALIZATION, ENDOSCOPIC APPROACH W/ MLB      Prior to Admission medications   Medication Sig Start Date End Date Taking? Authorizing Provider  acetaminophen (TYLENOL) 500 MG tablet Take 1,000 mg by mouth daily as needed for fever.    Yes [provider]  Brexpiprazole ([name]) 4 MG TABS Take 4 mg by mouth in the morning. 01/22/20  Yes [name] T, PA-C  celecoxib (CELEBREX) 200 MG capsule Take 200 mg by mouth as needed. 04/29/20  Yes [provider]  CHLORPHENIRAMINE MALEATE PO Take by mouth. Once a day as needed for allergies   Yes [provider]  clonazePAM (KLONOPIN) 0.5 MG tablet Take 1 tablet (0.5 mg total) by mouth 2 (two) times daily as needed for anxiety. 05/21/20  Yes Hurst, Helene Kelp T, PA-C  clotrimazole-betamethasone (LOTRISONE) cream Apply to affected area 2 times daily prn  02/05/20  Yes Faustino Congress, NP  cycloSPORINE (RESTASIS) 0.05 % ophthalmic emulsion Place 1 drop into both eyes 2 (two) times daily.    Yes [provider]  dicyclomine (BENTYL) 20 MG tablet Take 1 tablet (20 mg total) by mouth 3 (three) times daily as needed for spasms (abdominal cramping). 07/07/18  Yes Long, Wonda Olds, MD  Famotidine-Ca Carb-Mag Hydrox (PEPCID COMPLETE PO) Take  1 tablet by mouth daily.   Yes [provider]  fluticasone (FLONASE) 50 MCG/ACT nasal spray Place 1 spray into both nostrils daily for 14 days. Patient taking differently: Place 1 spray into both nostrils as needed. 02/08/20 07/16/20 Yes Avegno, Darrelyn Hillock, FNP  fluvoxaMINE (LUVOX) 100 MG tablet Take 3 tablets (300 mg total) by mouth at bedtime. 05/21/20  Yes [name] T, PA-C  gabapentin (NEURONTIN) 600 MG tablet Take 2 tablets (1,200 mg total) by mouth 2 (two) times daily. 11/28/19  Yes [name] T, PA-C  hydrOXYzine (ATARAX/VISTARIL) 25 MG tablet Take 1 tablet (25 mg total) by mouth 3 (three) times daily as needed. 05/21/20  Yes Hurst, Dorothea Glassman, PA-C  hyoscyamine (LEVSIN) 0.125 MG tablet Take 1 tablet (0.125 mg total) by mouth every 4 (four) hours as needed (diarrhea). 05/03/19  Yes Mahala Menghini, PA-C  ibuprofen (ADVIL) 600 MG tablet Take 1 tablet (600 mg total) by mouth every 8 (eight) hours as needed. 06/27/20  Yes Lamptey, Myrene Galas, MD  loratadine (CLARITIN) 10 MG tablet Take 5 mg by mouth daily as needed for allergies.    Yes [provider]  olopatadine (PATANOL) 0.1 % ophthalmic solution Place 1 drop into both eyes daily as needed for allergies.    Yes [provider]  ondansetron (ZOFRAN-ODT) 4 MG disintegrating tablet TAKE ONE TABLET (4MG  TOTAL) BY MOUTH EVERY 8 HOURS AS NEEDED FOR NAUSEA OR VOMITING 10/17/19  Yes Lomax, Amy, NP  Rimegepant Sulfate (NURTEC) 75 MG TBDP Take 75 mg by mouth daily as needed (take for abortive therapy of migraine, no more than 1 tablet in 24 hours or 10 per month). 03/28/20  Yes Lomax, Amy, NP  sodium chloride (OCEAN NASAL SPRAY) 0.65 % nasal spray Place 1 spray into the nose as needed for congestion. 02/15/20  Yes Lindell Spar, MD  traZODone (DESYREL) 100 MG tablet Take 1-2 tablets (100-200 mg total) by mouth at bedtime as needed for sleep. 01/22/20  Yes Hurst, Dorothea Glassman, PA-C  valACYclovir (VALTREX) 500 MG tablet Take 500 mg by mouth  daily as needed (Flair up take Twice a day  for 3 days and stop).  01/03/18  Yes [provider]    Allergies as of 07/16/2020 - Review Complete 07/16/2020  Allergen Reaction Noted   Codeine Shortness Of Breath, Nausea Only, and Rash 02/15/2013   Penicillins Hives, Shortness Of Breath, Swelling, and Rash 10/09/2011   Divalproex sodium Hives, Itching, and Rash 01/28/2020   Meloxicam Other (See Comments) 05/08/2014   Sonata [zaleplon] Other (See Comments) 10/22/2017   Topamax [topiramate] Other (See Comments) 10/04/2018   Aciphex [rabeprazole] Swelling 07/16/2020   Aimovig [erenumab-aooe] Itching 06/26/2019   Galcanezumab-gnlm Rash and Hives 10/04/2018    Family History  Problem Relation Age of Onset   Healthy Mother    Healthy Father    Colon cancer Neg Hx    Colon polyps Neg Hx     Social History   Socioeconomic History   Marital status: Married    Spouse name: [name]   Number of children: 0  Years of education: Not on file   Highest education level: Not on file  Occupational History   Not on file  Tobacco Use   Smoking status: Every Day    Packs/day: 1.00    Years: 20.00    Pack years: 20.00    Types: Cigarettes    Start date: 01/20/1988   Smokeless tobacco: Never  Vaping Use   Vaping Use: Never used  Substance and Sexual Activity   Alcohol use: No    Alcohol/week: 0.0 standard drinks   Drug use: No   Sexual activity: Yes    Birth control/protection: None  Other Topics Concern   Not on file  Social History Narrative   Lives with husband -[name] of 61 years       Yorkie- Max      Enjoys: reading-all genres      Diet: eats all food groups   Caffeine: 1 cup daily at times, diet dr pepper daily   Water: gatorade  zero and water       Wears seat belt    Does not use phone while driving   Smoke and Development worker, 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         Social Determinants of Radio broadcast assistant Strain: Low Risk     Difficulty of Paying Living Expenses: Not hard at all  Food Insecurity: No Food Insecurity   Worried About Charity fundraiser in the Last Year: Never true   Arboriculturist in the Last Year: Never true  Transportation Needs: No Transportation Needs   Lack of Transportation (Medical): No   Lack of Transportation (Non-Medical): No  Physical Activity: Inactive   Days of Exercise per Week: 0 days   Minutes of Exercise per Session: 0 min  Stress: No Stress Concern Present   Feeling of Stress : Only a little  Social Connections: Moderately Isolated   Frequency of Communication with Friends and Family: Once a week   Frequency of Social Gatherings with Friends and Family: Once a week   Attends Religious Services: More than 4 times per year   Active Member of [employer] or Organizations: No   Attends Music therapist: Never   Marital Status: Married  Human resources officer Violence: Not At Risk   Fear of Current or Ex-Partner: No   Emotionally Abused: No   Physically Abused: No   Sexually Abused: No    Review of Systems: See HPI, otherwise negative ROS  Physical Exam: BP 121/80   Pulse 71   Temp 98.2 F (36.8 C) (Temporal)   Ht 5\' 4"  (1.626 m)   Wt 173 lb (78.5 kg)   LMP 10/07/2011   BMI 29.70 kg/m  General:   Alert,  Well-developed, well-nourished, pleasant and cooperative in NAD Abdomen: Non-distended, normal bowel sounds.  Soft and nontender without appreciable mass or hepatosplenomegaly.   Impression/Plan: Pleasant :  55 year old lady with bulimia nervosa.  She continues to display maladaptive behavior related to this disorder.  Nocturnal binge and purge symptoms continue to be a persistent issue.  GERD, however, seems to have improved.  She is intolerant to multitude of PPIs.  No alarm symptoms. Alternating constipation and diarrhea.  Most likely related to a component of IBS and fluctuating dietary habits. From review of the record, I do not see where she has had a screen  for celiac disease.  Recommendations:  Continue cognitive behavioral therapy for bulimia.  This is very important and helpful  May use Pepcid or Pepcid Complete in the morning and in the evening as needed for reflux symptoms  May use 1 Imodium daily as needed for constipation  Need serum total IgA level and TTG IgA level drawn in the near future  Office visit in 6 months  Call me if you have any interim issues.     Notice: This dictation was prepared with Dragon dictation along with smaller phrase technology. Any transcriptional errors that result from this process are unintentional and may not be corrected upon review.

## 2020-07-17 DIAGNOSIS — F502 Bulimia nervosa: Secondary | ICD-10-CM | POA: Diagnosis not present

## 2020-07-17 DIAGNOSIS — F3131 Bipolar disorder, current episode depressed, mild: Secondary | ICD-10-CM | POA: Diagnosis not present

## 2020-07-17 DIAGNOSIS — F429 Obsessive-compulsive disorder, unspecified: Secondary | ICD-10-CM | POA: Diagnosis not present

## 2020-07-17 DIAGNOSIS — F4310 Post-traumatic stress disorder, unspecified: Secondary | ICD-10-CM | POA: Diagnosis not present

## 2020-07-17 NOTE — Telephone Encounter (Signed)
Celiac evaluation pending.  As discussed yesterday use Imodium.  If she is having a period of more diarrhea.  I would consider taking an Imodium at bedtime and then 1 in the morning before she gets up.  May take up to 4 daily.  With think about taking Imodium first thing when she gets up to prevent diarrhea

## 2020-07-17 NOTE — Telephone Encounter (Signed)
Interesting report.  OK; Imodium only for loose stools; begin benefiber 1 tablespoon daily x 3 weeks; then increase to 2  Tablespoons thereafter.

## 2020-07-18 ENCOUNTER — Encounter: Payer: Self-pay | Admitting: Nurse Practitioner

## 2020-07-18 ENCOUNTER — Other Ambulatory Visit: Payer: Self-pay

## 2020-07-18 ENCOUNTER — Ambulatory Visit: Payer: Medicare PPO | Admitting: Nurse Practitioner

## 2020-07-18 VITALS — BP 118/74 | HR 78 | Temp 97.6°F | Ht 64.0 in | Wt 175.0 lb

## 2020-07-18 DIAGNOSIS — G2581 Restless legs syndrome: Secondary | ICD-10-CM

## 2020-07-18 LAB — IGA: IgA/Immunoglobulin A, Serum: 100 mg/dL (ref 87–352)

## 2020-07-18 LAB — TISSUE TRANSGLUTAMINASE, IGA: Transglutaminase IgA: <2 U/mL (ref 0–3)

## 2020-07-18 MED ORDER — DICYCLOMINE HCL 20 MG PO TABS: 20.0000 mg | ORAL_TABLET | Freq: Three times a day (TID) | ORAL | 0 refills | Status: DC | PRN

## 2020-07-18 MED ORDER — PRAMIPEXOLE DIHYDROCHLORIDE 0.125 MG PO TABS: 0.1250 mg | ORAL_TABLET | Freq: Every evening | ORAL | 1 refills | Status: DC | PRN

## 2020-07-18 NOTE — Assessment & Plan Note (Signed)
-  unable to tolerate requip -Rx. Pramipexole -If no improvement, she is already followed by neurology... would have her call and setup appt. with neurology

## 2020-07-18 NOTE — Progress Notes (Signed)
Acute Office Visit  Subjective:    Patient ID: Jennifer [name], female    DOB: Sep 19, 1965, 54 y.o.   MRN: [id number]  Chief Complaint  Patient presents with   restless leg syndrome    Follow up. Hallucinated after taking the Ropinrole, has stopped it.     HPI Patient is in today for med check. She was started on requip at her last OV, but she states she stopped this d/t hallucinations.  She states it felt like things were coming at her face.  She also had diarrhea with doxycycline, so she stopped taking this after it was prescribed for tick bite. She saw Dr. Jena Gauss, and she has been having 8 BMs per day. She is taking benefiber.  Past Medical History:  Diagnosis Date   Abdominal pain, epigastric 10/07/2018   Acute non-recurrent maxillary sinusitis 11/07/2019   Anxiety    Binge-eating and purging type anorexia nervosa    Bipolar affective disorder ([facility]) 12/04/2015   Bipolar affective disorder, current episode manic without psychotic symptoms ([facility]) 12/04/2015   Bipolar disorder ([facility])    Cellulitis and abscess of buttock 02/15/2013   Cystitis, interstitial    Depression    Depression    Phreesia 01/28/2020   Esophageal polyp    about 20 years ago   GERD (gastroesophageal reflux disease)    Left wrist pain 03/22/2019   OCD (obsessive compulsive disorder)    Palpitations 05/25/2014   Rectal pain 12/06/2018   Rib pain on right side 01/31/2019   Rosacea 03/27/2019   Sprain of temporomandibular joint or ligament 01/10/2019    Past Surgical History:  Procedure Laterality Date   BLADDER SURGERY     CHOLECYSTECTOMY     COLONOSCOPY WITH PROPOFOL N/A 10/05/2019   Procedure: COLONOSCOPY WITH PROPOFOL;  Surgeon: Corbin Ade, MD;  [facility];  Service: Endoscopy;  Laterality: N/A;  12:45pm   COSMETIC SURGERY N/A    Phreesia 01/28/2020   ESOPHAGOGASTRODUODENOSCOPY  09/28/2016   [facility]; Dr. Marca Ancona; erosions in the esophagus, 5 cm hiatal hernia, nonbleeding erosive  gastropathy s/p biopsied, normal duodenum.  Path with chronic inactive gastritis, no H. pylori or intestinal metaplasia.    ESOPHAGOGASTRODUODENOSCOPY (EGD) WITH PROPOFOL N/A 10/17/2018   Dr. Jena Gauss: mild reflux esophagitis, small hiatal hernia   POLYPECTOMY  10/05/2019   Procedure: POLYPECTOMY;  Surgeon: Corbin Ade, MD;  [facility];  Service: Endoscopy;;   VOCAL CORD LATERALIZATION, ENDOSCOPIC APPROACH W/ MLB      Family History  Problem Relation Age of Onset   Healthy Mother    Healthy Father    Colon cancer Neg Hx    Colon polyps Neg Hx     Social History   Socioeconomic History   Marital status: Married    Spouse name: Todd   Number of children: 0   Years of education: Not on file   Highest education level: Not on file  Occupational History   Not on file  Tobacco Use   Smoking status: Every Day    Packs/day: 1.00    Years: 20.00    Pack years: 20.00    Types: Cigarettes    Start date: 01/20/1988   Smokeless tobacco: Never  Vaping Use   Vaping Use: Never used  Substance and Sexual Activity   Alcohol use: No    Alcohol/week: 0.0 standard drinks   Drug use: No   Sexual activity: Yes    Birth control/protection: None  Other Topics Concern  Not on file  Social History Narrative   Lives with husband -[name] of 65 years       Yorkie- Max      Enjoys: reading-all genres      Diet: eats all food groups   Caffeine: 1 cup daily at times, diet dr pepper daily   Water: gatorade  zero and water       Wears seat belt    Does not use phone while driving   Smoke and Development worker, 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         Social Determinants of Radio broadcast assistant Strain: Low Risk    Difficulty of Paying Living Expenses: Not hard at all  Food Insecurity: No Food Insecurity   Worried About Charity fundraiser in the Last Year: Never true   Arboriculturist in the Last Year: Never true  Transportation Needs: No Transportation  Needs   Lack of Transportation (Medical): No   Lack of Transportation (Non-Medical): No  Physical Activity: Inactive   Days of Exercise per Week: 0 days   Minutes of Exercise per Session: 0 min  Stress: No Stress Concern Present   Feeling of Stress : Only a little  Social Connections: Moderately Isolated   Frequency of Communication with Friends and Family: Once a week   Frequency of Social Gatherings with Friends and Family: Once a week   Attends Religious Services: More than 4 times per year   Active Member of [employer] or Organizations: No   Attends Archivist Meetings: Never   Marital Status: Married  Human resources officer Violence: Not At Risk   Fear of Current or Ex-Partner: No   Emotionally Abused: No   Physically Abused: No   Sexually Abused: No    Outpatient Medications Prior to Visit  Medication Sig Dispense Refill   acetaminophen (TYLENOL) 500 MG tablet Take 1,000 mg by mouth daily as needed for fever.      Brexpiprazole ([name]) 4 MG TABS Take 4 mg by mouth in the morning. 30 tablet 11   celecoxib (CELEBREX) 200 MG capsule Take 200 mg by mouth as needed.     CHLORPHENIRAMINE MALEATE PO Take by mouth. Once a day as needed for allergies     clonazePAM (KLONOPIN) 0.5 MG tablet Take 1 tablet (0.5 mg total) by mouth 2 (two) times daily as needed for anxiety. 20 tablet 5   clotrimazole-betamethasone (LOTRISONE) cream Apply to affected area 2 times daily prn 15 g 0   cycloSPORINE (RESTASIS) 0.05 % ophthalmic emulsion Place 1 drop into both eyes 2 (two) times daily.      Famotidine-Ca Carb-Mag Hydrox (PEPCID COMPLETE PO) Take 1 tablet by mouth daily.     fluvoxaMINE (LUVOX) 100 MG tablet Take 3 tablets (300 mg total) by mouth at bedtime. 270 tablet 3   gabapentin (NEURONTIN) 600 MG tablet Take 2 tablets (1,200 mg total) by mouth 2 (two) times daily. 120 tablet 5   hydrOXYzine (ATARAX/VISTARIL) 25 MG tablet Take 1 tablet (25 mg total) by mouth 3 (three) times daily as needed.  60 tablet 11   hyoscyamine (LEVSIN) 0.125 MG tablet Take 1 tablet (0.125 mg total) by mouth every 4 (four) hours as needed (diarrhea). 120 tablet 1   ibuprofen (ADVIL) 600 MG tablet Take 1 tablet (600 mg total) by mouth every 8 (eight) hours as needed. 30 tablet 0   loratadine (CLARITIN) 10 MG tablet Take 5  mg by mouth daily as needed for allergies.      olopatadine (PATANOL) 0.1 % ophthalmic solution Place 1 drop into both eyes daily as needed for allergies.      ondansetron (ZOFRAN-ODT) 4 MG disintegrating tablet TAKE ONE TABLET ($RemoveBef'4MG'IqJVIlzcEM$  TOTAL) BY MOUTH EVERY 8 HOURS AS NEEDED FOR NAUSEA OR VOMITING 30 tablet 1   Rimegepant Sulfate (NURTEC) 75 MG TBDP Take 75 mg by mouth daily as needed (take for abortive therapy of migraine, no more than 1 tablet in 24 hours or 10 per month). 10 tablet 11   sodium chloride (OCEAN NASAL SPRAY) 0.65 % nasal spray Place 1 spray into the nose as needed for congestion. 30 mL 12   traZODone (DESYREL) 100 MG tablet Take 1-2 tablets (100-200 mg total) by mouth at bedtime as needed for sleep. 60 tablet 11   valACYclovir (VALTREX) 500 MG tablet Take 500 mg by mouth daily as needed (Flair up take Twice a day  for 3 days and stop).      dicyclomine (BENTYL) 20 MG tablet Take 1 tablet (20 mg total) by mouth 3 (three) times daily as needed for spasms (abdominal cramping). 20 tablet 0   fluticasone (FLONASE) 50 MCG/ACT nasal spray Place 1 spray into both nostrils daily for 14 days. (Patient taking differently: Place 1 spray into both nostrils as needed.) 16 g 0   No facility-administered medications prior to visit.    Allergies  Allergen Reactions   Codeine Shortness Of Breath, Nausea Only and Rash   Penicillins Hives, Shortness Of Breath, Swelling and Rash    Has patient had a PCN reaction causing immediate rash, facial/tongue/throat swelling, SOB or lightheadedness with hypotension: yes Has patient had a PCN reaction causing severe rash involving mucus membranes or skin  necrosis: no Has patient had a PCN reaction that required hospitalization: no Has patient had a PCN reaction occurring within the last 10 years: no If all of the above answers are "NO", then may proceed with Cephalosporin use.;      Divalproex Sodium Hives, Itching and Rash   Meloxicam Other (See Comments)    Possible chest tightness - instructed by MD not to take   Sonata [Zaleplon] Other (See Comments)    Hallucinations   Topamax [Topiramate] Other (See Comments)    Low BP and dizziness   Aciphex [Rabeprazole] Swelling   Aimovig [Erenumab-Aooe] Itching   Galcanezumab-Gnlm Rash and Hives    Review of Systems  Constitutional: Negative.   Respiratory: Negative.    Cardiovascular: Negative.   Neurological:        Restless legs  Psychiatric/Behavioral: Negative.        Objective:    Physical Exam Constitutional:      Appearance: Normal appearance.  Cardiovascular:     Rate and Rhythm: Normal rate and regular rhythm.     Pulses: Normal pulses.     Heart sounds: Normal heart sounds.  Pulmonary:     Effort: Pulmonary effort is normal.     Breath sounds: Normal breath sounds.  Neurological:     General: No focal deficit present.     Mental Status: She is alert and oriented to person, place, and time.  Psychiatric:        Mood and Affect: Mood normal.        Behavior: Behavior normal.        Thought Content: Thought content normal.        Judgment: Judgment normal.    BP 118/74 (BP Location:  Left Arm, Patient Position: Sitting, Cuff Size: Large)   Pulse 78   Temp 97.6 F (36.4 C) (Temporal)   Ht $R'5\' 4"'TR$  (1.626 m)   Wt 175 lb (79.4 kg)   LMP 10/07/2011   SpO2 97%   BMI 30.04 kg/m  Wt Readings from Last 3 Encounters:  07/18/20 175 lb (79.4 kg)  07/16/20 173 lb (78.5 kg)  06/13/20 171 lb (77.6 kg)    Health Maintenance Due  Topic Date Due   HIV Screening  Never done   PAP SMEAR-Modifier  05/23/2016   MAMMOGRAM  06/21/2020    There are no preventive care  reminders to display for this patient.   Lab Results  Component Value Date   TSH 0.938 03/26/2020   Lab Results  Component Value Date   WBC 7.6 03/26/2020   HGB 14.8 03/26/2020   HCT 43.1 03/26/2020   MCV 88 03/26/2020   PLT 187 03/20/2019   Lab Results  Component Value Date   NA 139 03/26/2020   K 4.3 03/26/2020   CO2 22 03/26/2020   GLUCOSE 111 (H) 03/26/2020   BUN 16 03/26/2020   CREATININE 0.94 03/26/2020   BILITOT 0.4 03/26/2020   ALKPHOS 97 03/26/2020   AST 26 03/26/2020   ALT 24 03/26/2020   PROT 7.0 03/26/2020   ALBUMIN 4.5 03/26/2020   CALCIUM 9.7 03/26/2020   ANIONGAP 7 03/20/2019   EGFR 72 03/26/2020   Lab Results  Component Value Date   CHOL 158 03/26/2020   Lab Results  Component Value Date   HDL 55 03/26/2020   Lab Results  Component Value Date   LDLCALC 90 03/26/2020   Lab Results  Component Value Date   TRIG 65 03/26/2020   Lab Results  Component Value Date   CHOLHDL 2.9 03/26/2020   Lab Results  Component Value Date   HGBA1C 5.7 (H) 03/26/2020       Assessment & Plan:   Problem List Items Addressed This Visit       Other   Restless leg syndrome - Primary    -unable to tolerate requip -Rx. Pramipexole -If no improvement, she is already followed by neurology... would have her call and setup appt. with neurology       Relevant Medications   pramipexole (MIRAPEX) 0.125 MG tablet     Meds ordered this encounter  Medications   dicyclomine (BENTYL) 20 MG tablet    Sig: Take 1 tablet (20 mg total) by mouth 3 (three) times daily as needed for spasms (abdominal cramping).    Dispense:  20 tablet    Refill:  0   pramipexole (MIRAPEX) 0.125 MG tablet    Sig: Take 1-3 tablets (0.125-0.375 mg total) by mouth at bedtime as needed (restless legs).    Dispense:  90 tablet    Refill:  [place], NP

## 2020-07-22 ENCOUNTER — Encounter: Payer: Self-pay | Admitting: Nurse Practitioner

## 2020-07-23 NOTE — Telephone Encounter (Signed)
That is fine, I wrote for a 30-day supply if you are using 3 mg at a time. When it is time for a refill, send me a reminder, and I will make sure the bottle says 3 mg.

## 2020-07-24 NOTE — Progress Notes (Signed)
Pt had this done on 07/16/2020 . The results have been reported to the dr. And from there I reported to the pt.

## 2020-08-02 DIAGNOSIS — F429 Obsessive-compulsive disorder, unspecified: Secondary | ICD-10-CM | POA: Diagnosis not present

## 2020-08-02 DIAGNOSIS — F4310 Post-traumatic stress disorder, unspecified: Secondary | ICD-10-CM | POA: Diagnosis not present

## 2020-08-02 DIAGNOSIS — F502 Bulimia nervosa: Secondary | ICD-10-CM | POA: Diagnosis not present

## 2020-08-02 DIAGNOSIS — F3131 Bipolar disorder, current episode depressed, mild: Secondary | ICD-10-CM | POA: Diagnosis not present

## 2020-08-07 DIAGNOSIS — F429 Obsessive-compulsive disorder, unspecified: Secondary | ICD-10-CM | POA: Diagnosis not present

## 2020-08-07 DIAGNOSIS — F502 Bulimia nervosa: Secondary | ICD-10-CM | POA: Diagnosis not present

## 2020-08-07 DIAGNOSIS — F4310 Post-traumatic stress disorder, unspecified: Secondary | ICD-10-CM | POA: Diagnosis not present

## 2020-08-07 DIAGNOSIS — F3131 Bipolar disorder, current episode depressed, mild: Secondary | ICD-10-CM | POA: Diagnosis not present

## 2020-08-14 DIAGNOSIS — F3131 Bipolar disorder, current episode depressed, mild: Secondary | ICD-10-CM | POA: Diagnosis not present

## 2020-08-14 DIAGNOSIS — F502 Bulimia nervosa: Secondary | ICD-10-CM | POA: Diagnosis not present

## 2020-08-14 DIAGNOSIS — F4310 Post-traumatic stress disorder, unspecified: Secondary | ICD-10-CM | POA: Diagnosis not present

## 2020-08-14 DIAGNOSIS — F429 Obsessive-compulsive disorder, unspecified: Secondary | ICD-10-CM | POA: Diagnosis not present

## 2020-08-15 ENCOUNTER — Encounter: Payer: Self-pay | Admitting: Family Medicine

## 2020-08-16 ENCOUNTER — Telehealth: Payer: Self-pay | Admitting: Family Medicine

## 2020-08-16 DIAGNOSIS — F429 Obsessive-compulsive disorder, unspecified: Secondary | ICD-10-CM | POA: Diagnosis not present

## 2020-08-16 DIAGNOSIS — F3131 Bipolar disorder, current episode depressed, mild: Secondary | ICD-10-CM | POA: Diagnosis not present

## 2020-08-16 DIAGNOSIS — F502 Bulimia nervosa: Secondary | ICD-10-CM | POA: Diagnosis not present

## 2020-08-16 DIAGNOSIS — F4310 Post-traumatic stress disorder, unspecified: Secondary | ICD-10-CM | POA: Diagnosis not present

## 2020-08-16 NOTE — Telephone Encounter (Signed)
Appt made. Pt informed.

## 2020-08-16 NOTE — Telephone Encounter (Signed)
Patient called and states the top of her feet around her toe area and she called the neurologist and they said they only have a referral for migraines.  They told her see will need another referral for this new problem.    She is wanting to see Dr. Moshe Cipro asap .    Please call her back at  [phone]  She will not be available between 11:00 and 1:00 today

## 2020-08-16 NOTE — Telephone Encounter (Signed)
This is [facility] patient-

## 2020-08-19 ENCOUNTER — Ambulatory Visit: Payer: Medicare PPO | Admitting: Nurse Practitioner

## 2020-08-19 ENCOUNTER — Encounter: Payer: Self-pay | Admitting: Nurse Practitioner

## 2020-08-19 ENCOUNTER — Other Ambulatory Visit: Payer: Self-pay

## 2020-08-19 VITALS — BP 107/70 | HR 75 | Temp 97.0°F | Ht 64.0 in | Wt 176.0 lb

## 2020-08-19 DIAGNOSIS — R238 Other skin changes: Secondary | ICD-10-CM

## 2020-08-19 DIAGNOSIS — G629 Polyneuropathy, unspecified: Secondary | ICD-10-CM

## 2020-08-19 DIAGNOSIS — G2581 Restless legs syndrome: Secondary | ICD-10-CM

## 2020-08-19 DIAGNOSIS — R233 Spontaneous ecchymoses: Secondary | ICD-10-CM | POA: Insufficient documentation

## 2020-08-19 MED ORDER — PRAMIPEXOLE DIHYDROCHLORIDE 0.5 MG PO TABS: 0.5000 mg | ORAL_TABLET | Freq: Every day | ORAL | 1 refills | Status: DC

## 2020-08-19 NOTE — Progress Notes (Signed)
Established Patient Office Visit  Subjective:  Patient ID: Jennifer [name], female    DOB: 08/19/1965  Age: 55 y.o. MRN: [id number]  CC:  Chief Complaint  Patient presents with   Numbness    In hands and feet, worsening over the past 2 weeks. Needs referral to neurologist-already sees them for migraines.     HPI Jennifer [name] presents for neuropathy. She has numbness to hands and feet.  She is taking gabapentin currently, and that is helping some, but the numbness is still bothering her.  She sees neurology currently for migraines, but not for neuropathy.  She has been taking pramipexole for RLS and that helps some. She would like to increase her dose.   Past Medical History:  Diagnosis Date   Abdominal pain, epigastric 10/07/2018   Acute non-recurrent maxillary sinusitis 11/07/2019   Anxiety    Binge-eating and purging type anorexia nervosa    Bipolar affective disorder ([place]) 12/04/2015   Bipolar affective disorder, current episode manic without psychotic symptoms ([place]) 12/04/2015   Bipolar disorder ([place])    Cellulitis and abscess of buttock 02/15/2013   Cystitis, interstitial    Depression    Depression    Phreesia 01/28/2020   Esophageal polyp    about 20 years ago   GERD (gastroesophageal reflux disease)    Left wrist pain 03/22/2019   OCD (obsessive compulsive disorder)    Palpitations 05/25/2014   Rectal pain 12/06/2018   Rib pain on right side 01/31/2019   Rosacea 03/27/2019   Sprain of temporomandibular joint or ligament 01/10/2019    Past Surgical History:  Procedure Laterality Date   BLADDER SURGERY     CHOLECYSTECTOMY     COLONOSCOPY WITH PROPOFOL N/A 10/05/2019   Procedure: COLONOSCOPY WITH PROPOFOL;  Surgeon: Daneil Dolin, MD;  [facility];  Service: Endoscopy;  Laterality: N/A;  12:45pm   COSMETIC SURGERY N/A    Phreesia 01/28/2020   ESOPHAGOGASTRODUODENOSCOPY  09/28/2016   [facility]; Dr. Therisa Doyne; erosions in the esophagus, 5 cm hiatal hernia,  nonbleeding erosive gastropathy s/p biopsied, normal duodenum.  Path with chronic inactive gastritis, no H. pylori or intestinal metaplasia.    ESOPHAGOGASTRODUODENOSCOPY (EGD) WITH PROPOFOL N/A 10/17/2018   Dr. Gala Romney: mild reflux esophagitis, small hiatal hernia   POLYPECTOMY  10/05/2019   Procedure: POLYPECTOMY;  Surgeon: Daneil Dolin, MD;  [facility];  Service: Endoscopy;;   VOCAL CORD LATERALIZATION, ENDOSCOPIC APPROACH W/ MLB      Family History  Problem Relation Age of Onset   Healthy Mother    Healthy Father    Colon cancer Neg Hx    Colon polyps Neg Hx     Social History   Socioeconomic History   Marital status: Married    Spouse name: Jennifer [name]   Number of children: 0   Years of education: Not on file   Highest education level: Not on file  Occupational History   Not on file  Tobacco Use   Smoking status: Every Day    Packs/day: 1.00    Years: 20.00    Pack years: 20.00    Types: Cigarettes    Start date: 01/20/1988   Smokeless tobacco: Never  Vaping Use   Vaping Use: Never used  Substance and Sexual Activity   Alcohol use: No    Alcohol/week: 0.0 standard drinks   Drug use: No   Sexual activity: Yes    Birth control/protection: None  Other Topics Concern   Not  on file  Social History Narrative   Lives with husband -Jennifer [name] of 64 years       Yorkie- Max      Enjoys: reading-all genres      Diet: eats all food groups   Caffeine: 1 cup daily at times, diet dr pepper daily   Water: gatorade  zero and water       Wears seat belt    Does not use phone while driving   Smoke and Development worker, 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         Social Determinants of Radio broadcast assistant Strain: Low Risk    Difficulty of Paying Living Expenses: Not hard at all  Food Insecurity: No Food Insecurity   Worried About Charity fundraiser in the Last Year: Never true   Arboriculturist in the Last Year: Never true  Transportation  Needs: No Transportation Needs   Lack of Transportation (Medical): No   Lack of Transportation (Non-Medical): No  Physical Activity: Inactive   Days of Exercise per Week: 0 days   Minutes of Exercise per Session: 0 min  Stress: No Stress Concern Present   Feeling of Stress : Only a little  Social Connections: Moderately Isolated   Frequency of Communication with Friends and Family: Once a week   Frequency of Social Gatherings with Friends and Family: Once a week   Attends Religious Services: More than 4 times per year   Active Member of [employer] or Organizations: No   Attends Archivist Meetings: Never   Marital Status: Married  Human resources officer Violence: Not At Risk   Fear of Current or Ex-Partner: No   Emotionally Abused: No   Physically Abused: No   Sexually Abused: No    Outpatient Medications Prior to Visit  Medication Sig Dispense Refill   acetaminophen (TYLENOL) 500 MG tablet Take 1,000 mg by mouth daily as needed for fever.      Brexpiprazole ([name]) 4 MG TABS Take 4 mg by mouth in the morning. 30 tablet 11   clonazePAM (KLONOPIN) 0.5 MG tablet Take 1 tablet (0.5 mg total) by mouth 2 (two) times daily as needed for anxiety. 20 tablet 5   dicyclomine (BENTYL) 20 MG tablet Take 1 tablet (20 mg total) by mouth 3 (three) times daily as needed for spasms (abdominal cramping). 20 tablet 0   Famotidine-Ca Carb-Mag Hydrox (PEPCID COMPLETE PO) Take 1 tablet by mouth daily.     fluvoxaMINE (LUVOX) 100 MG tablet Take 3 tablets (300 mg total) by mouth at bedtime. 270 tablet 3   gabapentin (NEURONTIN) 600 MG tablet Take 2 tablets (1,200 mg total) by mouth 2 (two) times daily. 120 tablet 5   hydrOXYzine (ATARAX/VISTARIL) 25 MG tablet Take 1 tablet (25 mg total) by mouth 3 (three) times daily as needed. 60 tablet 11   hyoscyamine (LEVSIN) 0.125 MG tablet Take 1 tablet (0.125 mg total) by mouth every 4 (four) hours as needed (diarrhea). 120 tablet 1   ibuprofen (ADVIL) 600 MG tablet  Take 1 tablet (600 mg total) by mouth every 8 (eight) hours as needed. 30 tablet 0   loratadine (CLARITIN) 10 MG tablet Take 5 mg by mouth daily as needed for allergies.      naproxen (NAPROSYN) 500 MG tablet Take 500 mg by mouth 2 (two) times daily as needed for migraine.     ondansetron (ZOFRAN-ODT) 4 MG disintegrating tablet TAKE ONE  TABLET (4MG TOTAL) BY MOUTH EVERY 8 HOURS AS NEEDED FOR NAUSEA OR VOMITING 30 tablet 1   Rimegepant Sulfate (NURTEC) 75 MG TBDP Take 75 mg by mouth daily as needed (take for abortive therapy of migraine, no more than 1 tablet in 24 hours or 10 per month). 10 tablet 11   sodium chloride (OCEAN NASAL SPRAY) 0.65 % nasal spray Place 1 spray into the nose as needed for congestion. 30 mL 12   traZODone (DESYREL) 100 MG tablet Take 1-2 tablets (100-200 mg total) by mouth at bedtime as needed for sleep. 60 tablet 11   valACYclovir (VALTREX) 500 MG tablet Take 500 mg by mouth daily as needed (Flair up take Twice a day  for 3 days and stop).      celecoxib (CELEBREX) 200 MG capsule Take 200 mg by mouth as needed.     CHLORPHENIRAMINE MALEATE PO Take by mouth. Once a day as needed for allergies     pramipexole (MIRAPEX) 0.125 MG tablet Take 1-3 tablets (0.125-0.375 mg total) by mouth at bedtime as needed (restless legs). 90 tablet 1   fluticasone (FLONASE) 50 MCG/ACT nasal spray Place 1 spray into both nostrils daily for 14 days. (Patient taking differently: Place 1 spray into both nostrils as needed.) 16 g 0   clotrimazole-betamethasone (LOTRISONE) cream Apply to affected area 2 times daily prn 15 g 0   cycloSPORINE (RESTASIS) 0.05 % ophthalmic emulsion Place 1 drop into both eyes 2 (two) times daily.      olopatadine (PATANOL) 0.1 % ophthalmic solution Place 1 drop into both eyes daily as needed for allergies.      No facility-administered medications prior to visit.    Allergies  Allergen Reactions   Codeine Shortness Of Breath, Nausea Only and Rash   Penicillins  Hives, Shortness Of Breath, Swelling and Rash    Has patient had a PCN reaction causing immediate rash, facial/tongue/throat swelling, SOB or lightheadedness with hypotension: yes Has patient had a PCN reaction causing severe rash involving mucus membranes or skin necrosis: no Has patient had a PCN reaction that required hospitalization: no Has patient had a PCN reaction occurring within the last 10 years: no If all of the above answers are "NO", then may proceed with Cephalosporin use.;      Divalproex Sodium Hives, Itching and Rash   Meloxicam Other (See Comments)    Possible chest tightness - instructed by MD not to take   Sonata [Zaleplon] Other (See Comments)    Hallucinations   Topamax [Topiramate] Other (See Comments)    Low BP and dizziness   Aciphex [Rabeprazole] Swelling   Aimovig [Erenumab-Aooe] Itching   Galcanezumab-Gnlm Rash and Hives    ROS Review of Systems  Constitutional: Negative.   Respiratory: Negative.    Cardiovascular: Negative.   Neurological:  Positive for numbness. Negative for facial asymmetry.       To bilateral hands and feet     Objective:    Physical Exam Constitutional:      Appearance: Normal appearance.  Cardiovascular:     Rate and Rhythm: Normal rate and regular rhythm.     Pulses: Normal pulses.     Heart sounds: Normal heart sounds.  Pulmonary:     Effort: Pulmonary effort is normal.     Breath sounds: Normal breath sounds.  Neurological:     General: No focal deficit present.     Mental Status: She is alert and oriented to person, place, and time.  Cranial Nerves: No cranial nerve deficit.     Motor: No weakness.    BP 107/70 (BP Location: Left Arm, Patient Position: Sitting, Cuff Size: Large)   Pulse 75   Temp (!) 97 F (36.1 C) (Temporal)   Ht _0  (1.626 m)   Wt 176 lb (79.8 kg)   LMP 10/07/2011   SpO2 95%   BMI 30.21 kg/m  Wt Readings from Last 3 Encounters:  08/19/20 176 lb (79.8 kg)  07/18/20 175 lb (79.4  kg)  07/16/20 173 lb (78.5 kg)     Health Maintenance Due  Topic Date Due   HIV Screening  Never done   PAP SMEAR-Modifier  05/23/2016   MAMMOGRAM  06/21/2020   INFLUENZA VACCINE  08/19/2020    There are no preventive care reminders to display for this patient.  Lab Results  Component Value Date   TSH 0.938 03/26/2020   Lab Results  Component Value Date   WBC 7.6 03/26/2020   HGB 14.8 03/26/2020   HCT 43.1 03/26/2020   MCV 88 03/26/2020   PLT 187 03/20/2019   Lab Results  Component Value Date   NA 139 03/26/2020   K 4.3 03/26/2020   CO2 22 03/26/2020   GLUCOSE 111 (H) 03/26/2020   BUN 16 03/26/2020   CREATININE 0.94 03/26/2020   BILITOT 0.4 03/26/2020   ALKPHOS 97 03/26/2020   AST 26 03/26/2020   ALT 24 03/26/2020   PROT 7.0 03/26/2020   ALBUMIN 4.5 03/26/2020   CALCIUM 9.7 03/26/2020   ANIONGAP 7 03/20/2019   EGFR 72 03/26/2020   Lab Results  Component Value Date   CHOL 158 03/26/2020   Lab Results  Component Value Date   HDL 55 03/26/2020   Lab Results  Component Value Date   LDLCALC 90 03/26/2020   Lab Results  Component Value Date   TRIG 65 03/26/2020   Lab Results  Component Value Date   CHOLHDL 2.9 03/26/2020   Lab Results  Component Value Date   HGBA1C 5.7 (H) 03/26/2020      Assessment & Plan:   Problem List Items Addressed This Visit       Nervous and Auditory   Peripheral neuropathy - Primary    -has been ongoing for several months -gabapentin and pramipexole help some, but not enough -referral to neuro       Relevant Medications   pramipexole (MIRAPEX) 0.5 MG tablet   Other Relevant Orders   CMP14+EGFR   CBC with Differential/Platelet   Ambulatory referral to Neurology     Other   Restless leg syndrome    -still having issues despite gabapentin and pramipexole -referral to neuro       Relevant Medications   pramipexole (MIRAPEX) 0.5 MG tablet   Other Relevant Orders   CMP14+EGFR   CBC with  Differential/Platelet   Ambulatory referral to Neurology   Easy bruising    -states she bruises with slight tough -will check CBC and CMP       Relevant Orders   CMP14+EGFR   CBC with Differential/Platelet    Meds ordered this encounter  Medications   pramipexole (MIRAPEX) 0.5 MG tablet    Sig: Take 1 tablet (0.5 mg total) by mouth at bedtime.    Dispense:  90 tablet    Refill:  1    Follow-up: Return if symptoms worsen or fail to improve.    Noreene Larsson, NP

## 2020-08-19 NOTE — Assessment & Plan Note (Signed)
-  has been ongoing for several months -gabapentin and pramipexole help some, but not enough -referral to neuro

## 2020-08-19 NOTE — Patient Instructions (Signed)
Please have labs drawn today

## 2020-08-19 NOTE — Assessment & Plan Note (Signed)
-  states she bruises with slight tough -will check CBC and CMP

## 2020-08-19 NOTE — Assessment & Plan Note (Signed)
-  still having issues despite gabapentin and pramipexole -referral to neuro

## 2020-08-20 DIAGNOSIS — G2581 Restless legs syndrome: Secondary | ICD-10-CM | POA: Diagnosis not present

## 2020-08-20 DIAGNOSIS — R238 Other skin changes: Secondary | ICD-10-CM | POA: Diagnosis not present

## 2020-08-20 DIAGNOSIS — G629 Polyneuropathy, unspecified: Secondary | ICD-10-CM | POA: Diagnosis not present

## 2020-08-21 DIAGNOSIS — F3131 Bipolar disorder, current episode depressed, mild: Secondary | ICD-10-CM | POA: Diagnosis not present

## 2020-08-21 DIAGNOSIS — F4310 Post-traumatic stress disorder, unspecified: Secondary | ICD-10-CM | POA: Diagnosis not present

## 2020-08-21 DIAGNOSIS — F429 Obsessive-compulsive disorder, unspecified: Secondary | ICD-10-CM | POA: Diagnosis not present

## 2020-08-21 DIAGNOSIS — F502 Bulimia nervosa: Secondary | ICD-10-CM | POA: Diagnosis not present

## 2020-08-21 LAB — CBC WITH DIFFERENTIAL/PLATELET
Basophils Absolute: 0.1 10*3/uL (ref 0.0–0.2)
Basos: 1 %
EOS (ABSOLUTE): 0.2 10*3/uL (ref 0.0–0.4)
Eos: 3 %
Hematocrit: 42.4 % (ref 34.0–46.6)
Hemoglobin: 14.7 g/dL (ref 11.1–15.9)
Immature Grans (Abs): 0 10*3/uL (ref 0.0–0.1)
Immature Granulocytes: 0 %
Lymphocytes Absolute: 2.2 10*3/uL (ref 0.7–3.1)
Lymphs: 29 %
MCH: 31 pg (ref 26.6–33.0)
MCHC: 34.7 g/dL (ref 31.5–35.7)
MCV: 90 fL (ref 79–97)
Monocytes Absolute: 0.8 10*3/uL (ref 0.1–0.9)
Monocytes: 10 %
Neutrophils Absolute: 4.3 10*3/uL (ref 1.4–7.0)
Neutrophils: 57 %
Platelets: 162 10*3/uL (ref 150–450)
RBC: 4.74 x10E6/uL (ref 3.77–5.28)
RDW: 12.2 % (ref 11.7–15.4)
WBC: 7.5 10*3/uL (ref 3.4–10.8)

## 2020-08-21 LAB — CMP14+EGFR
ALT: 33 IU/L — ABNORMAL HIGH (ref 0–32)
AST: 36 IU/L (ref 0–40)
Albumin/Globulin Ratio: 2.3 — ABNORMAL HIGH (ref 1.2–2.2)
Albumin: 4.8 g/dL (ref 3.8–4.9)
Alkaline Phosphatase: 115 IU/L (ref 44–121)
BUN/Creatinine Ratio: 17 (ref 9–23)
BUN: 15 mg/dL (ref 6–24)
Bilirubin Total: 0.6 mg/dL (ref 0.0–1.2)
CO2: 24 mmol/L (ref 20–29)
Calcium: 9.3 mg/dL (ref 8.7–10.2)
Chloride: 101 mmol/L (ref 96–106)
Creatinine, Ser: 0.87 mg/dL (ref 0.57–1.00)
Globulin, Total: 2.1 g/dL (ref 1.5–4.5)
Glucose: 99 mg/dL (ref 65–99)
Potassium: 4.4 mmol/L (ref 3.5–5.2)
Sodium: 140 mmol/L (ref 134–144)
Total Protein: 6.9 g/dL (ref 6.0–8.5)
eGFR: 79 mL/min/{1.73_m2} (ref >59)

## 2020-08-21 NOTE — Progress Notes (Signed)
Labs look good.

## 2020-08-27 ENCOUNTER — Telehealth: Payer: Medicare PPO | Admitting: Physician Assistant

## 2020-09-04 DIAGNOSIS — F3131 Bipolar disorder, current episode depressed, mild: Secondary | ICD-10-CM | POA: Diagnosis not present

## 2020-09-04 DIAGNOSIS — F429 Obsessive-compulsive disorder, unspecified: Secondary | ICD-10-CM | POA: Diagnosis not present

## 2020-09-04 DIAGNOSIS — F502 Bulimia nervosa: Secondary | ICD-10-CM | POA: Diagnosis not present

## 2020-09-04 DIAGNOSIS — F4310 Post-traumatic stress disorder, unspecified: Secondary | ICD-10-CM | POA: Diagnosis not present

## 2020-09-09 ENCOUNTER — Other Ambulatory Visit: Payer: Self-pay | Admitting: Physician Assistant

## 2020-09-11 DIAGNOSIS — F4310 Post-traumatic stress disorder, unspecified: Secondary | ICD-10-CM | POA: Diagnosis not present

## 2020-09-11 DIAGNOSIS — F3131 Bipolar disorder, current episode depressed, mild: Secondary | ICD-10-CM | POA: Diagnosis not present

## 2020-09-11 DIAGNOSIS — F502 Bulimia nervosa: Secondary | ICD-10-CM | POA: Diagnosis not present

## 2020-09-11 DIAGNOSIS — F429 Obsessive-compulsive disorder, unspecified: Secondary | ICD-10-CM | POA: Diagnosis not present

## 2020-09-12 ENCOUNTER — Ambulatory Visit: Payer: Medicare PPO | Admitting: Family Medicine

## 2020-09-18 DIAGNOSIS — A609 Anogenital herpesviral infection, unspecified: Secondary | ICD-10-CM | POA: Diagnosis not present

## 2020-09-25 DIAGNOSIS — F3131 Bipolar disorder, current episode depressed, mild: Secondary | ICD-10-CM | POA: Diagnosis not present

## 2020-09-25 DIAGNOSIS — F429 Obsessive-compulsive disorder, unspecified: Secondary | ICD-10-CM | POA: Diagnosis not present

## 2020-09-25 DIAGNOSIS — F4310 Post-traumatic stress disorder, unspecified: Secondary | ICD-10-CM | POA: Diagnosis not present

## 2020-09-25 DIAGNOSIS — F502 Bulimia nervosa: Secondary | ICD-10-CM | POA: Diagnosis not present

## 2020-09-27 DIAGNOSIS — Z7689 Persons encountering health services in other specified circumstances: Secondary | ICD-10-CM | POA: Diagnosis not present

## 2020-09-27 DIAGNOSIS — Z1231 Encounter for screening mammogram for malignant neoplasm of breast: Secondary | ICD-10-CM | POA: Diagnosis not present

## 2020-09-27 LAB — HM MAMMOGRAPHY

## 2020-10-01 ENCOUNTER — Telehealth: Payer: Self-pay | Admitting: Internal Medicine

## 2020-10-01 NOTE — Telephone Encounter (Signed)
Pt was made aware and verbalized understanding.  

## 2020-10-01 NOTE — Telephone Encounter (Signed)
Pt called stating that she was hurting in her breast bone area. Pt suspects that it is coming from her hiatal hernia. Pt states that she has taken pepto bismol, mylanta and one carafate to try to relieve the pain. Pt has only ate a pack of crackers, a lean burger patty and steamed corn so far today. Pt has some nausea but no constipation, diarrhea or fever. Please advise.

## 2020-10-01 NOTE — Telephone Encounter (Signed)
Pt called asking about what can she do about her hiatal hernia hurting her. She was last seen in June 2022. Please advise. [phone]

## 2020-10-02 ENCOUNTER — Encounter: Payer: Self-pay | Admitting: Internal Medicine

## 2020-10-02 ENCOUNTER — Ambulatory Visit (INDEPENDENT_AMBULATORY_CARE_PROVIDER_SITE_OTHER): Payer: Medicare PPO | Admitting: Internal Medicine

## 2020-10-02 ENCOUNTER — Other Ambulatory Visit: Payer: Self-pay

## 2020-10-02 DIAGNOSIS — F502 Bulimia nervosa: Secondary | ICD-10-CM | POA: Diagnosis not present

## 2020-10-02 DIAGNOSIS — K529 Noninfective gastroenteritis and colitis, unspecified: Secondary | ICD-10-CM

## 2020-10-02 DIAGNOSIS — F3131 Bipolar disorder, current episode depressed, mild: Secondary | ICD-10-CM | POA: Diagnosis not present

## 2020-10-02 DIAGNOSIS — F429 Obsessive-compulsive disorder, unspecified: Secondary | ICD-10-CM | POA: Diagnosis not present

## 2020-10-02 DIAGNOSIS — F4310 Post-traumatic stress disorder, unspecified: Secondary | ICD-10-CM | POA: Diagnosis not present

## 2020-10-02 NOTE — Progress Notes (Signed)
Virtual Visit via Telephone Note   This visit type was conducted due to national recommendations for restrictions regarding the COVID-19 Pandemic (e.g. social distancing) in an effort to limit this patient's exposure and mitigate transmission in our community.  Due to her co-morbid illnesses, this patient is at least at moderate risk for complications without adequate follow up.  This format is felt to be most appropriate for this patient at this time.  The patient did not have access to video technology/had technical difficulties with video requiring transitioning to audio format only (telephone).  All issues noted in this document were discussed and addressed.  No physical exam could be performed with this format.  Evaluation Performed:  Follow-up visit  Date:  10/02/2020   ID:  Jennifer [name], DOB [birth year], MRN [id number]  Patient Location: Home Provider Location: Office/Clinic  Participants: Patient Location of Patient: Home Location of Provider: Telehealth Consent was obtain for visit to be over via telehealth. I verified that I am speaking with the correct person using two identifiers.  PCP:  Noreene Larsson, NP   Chief Complaint:  Diarrhea and abdominal pain  History of Present Illness:    Jennifer [name] is a 55 y.o. female who has televisit for complaint of abdominal pain and diarrhea since yesterday.  She also reports nausea, but denies vomiting.  She denies any melena or hematochezia.  She denies fever, chills, sore throat, or cough.  Denies any recent unusual food.  Of note, she has Zofran and Levsin at home, but she has not taken them yet.  The patient does not have symptoms concerning for COVID-19 infection (fever, chills, cough, or new shortness of breath).   Past Medical, Surgical, Social History, Allergies, and Medications have been Reviewed.  Past Medical History:  Diagnosis Date   Abdominal pain, epigastric 10/07/2018   Acute non-recurrent maxillary sinusitis  11/07/2019   Anxiety    Binge-eating and purging type anorexia nervosa    Bipolar affective disorder ([place]) 12/04/2015   Bipolar affective disorder, current episode manic without psychotic symptoms ([place]) 12/04/2015   Bipolar disorder ([place])    Cellulitis and abscess of buttock 02/15/2013   Cystitis, interstitial    Depression    Depression    Phreesia 01/28/2020   Esophageal polyp    about 20 years ago   GERD (gastroesophageal reflux disease)    Left wrist pain 03/22/2019   OCD (obsessive compulsive disorder)    Palpitations 05/25/2014   Rectal pain 12/06/2018   Rib pain on right side 01/31/2019   Rosacea 03/27/2019   Sprain of temporomandibular joint or ligament 01/10/2019   Past Surgical History:  Procedure Laterality Date   BLADDER SURGERY     CHOLECYSTECTOMY     COLONOSCOPY WITH PROPOFOL N/A 10/05/2019   Procedure: COLONOSCOPY WITH PROPOFOL;  Surgeon: Daneil Dolin, MD;  [facility];  Service: Endoscopy;  Laterality: N/A;  12:45pm   COSMETIC SURGERY N/A    Phreesia 01/28/2020   ESOPHAGOGASTRODUODENOSCOPY  09/28/2016   [facility]; Dr. Therisa Doyne; erosions in the esophagus, 5 cm hiatal hernia, nonbleeding erosive gastropathy s/p biopsied, normal duodenum.  Path with chronic inactive gastritis, no H. pylori or intestinal metaplasia.    ESOPHAGOGASTRODUODENOSCOPY (EGD) WITH PROPOFOL N/A 10/17/2018   Dr. Gala Romney: mild reflux esophagitis, small hiatal hernia   POLYPECTOMY  10/05/2019   Procedure: POLYPECTOMY;  Surgeon: Daneil Dolin, MD;  [facility];  Service: Endoscopy;;   VOCAL CORD LATERALIZATION, ENDOSCOPIC APPROACH W/  MLB       Current Meds  Medication Sig   acetaminophen (TYLENOL) 500 MG tablet Take 1,000 mg by mouth daily as needed for fever.    Brexpiprazole ([name]) 4 MG TABS Take 4 mg by mouth in the morning.   clonazePAM (KLONOPIN) 0.5 MG tablet Take 1 tablet (0.5 mg total) by mouth 2 (two) times daily as needed for anxiety.   dicyclomine (BENTYL) 20 MG  tablet Take 1 tablet (20 mg total) by mouth 3 (three) times daily as needed for spasms (abdominal cramping).   Famotidine-Ca Carb-Mag Hydrox (PEPCID COMPLETE PO) Take 1 tablet by mouth daily.   fluvoxaMINE (LUVOX) 100 MG tablet Take 3 tablets (300 mg total) by mouth at bedtime.   gabapentin (NEURONTIN) 600 MG tablet TAKE TWO TABLETS ('1200MG'$  TOTAL) BY MOUTHTWO TIMES DAILY   hydrOXYzine (ATARAX/VISTARIL) 25 MG tablet Take 1 tablet (25 mg total) by mouth 3 (three) times daily as needed.   hyoscyamine (LEVSIN) 0.125 MG tablet Take 1 tablet (0.125 mg total) by mouth every 4 (four) hours as needed (diarrhea).   ibuprofen (ADVIL) 600 MG tablet Take 1 tablet (600 mg total) by mouth every 8 (eight) hours as needed.   loratadine (CLARITIN) 10 MG tablet Take 5 mg by mouth daily as needed for allergies.    naproxen (NAPROSYN) 500 MG tablet Take 500 mg by mouth 2 (two) times daily as needed for migraine.   ondansetron (ZOFRAN-ODT) 4 MG disintegrating tablet TAKE ONE TABLET ('4MG'$  TOTAL) BY MOUTH EVERY 8 HOURS AS NEEDED FOR NAUSEA OR VOMITING   pramipexole (MIRAPEX) 0.5 MG tablet Take 1 tablet (0.5 mg total) by mouth at bedtime.   Rimegepant Sulfate (NURTEC) 75 MG TBDP Take 75 mg by mouth daily as needed (take for abortive therapy of migraine, no more than 1 tablet in 24 hours or 10 per month).   sodium chloride (OCEAN NASAL SPRAY) 0.65 % nasal spray Place 1 spray into the nose as needed for congestion.   traZODone (DESYREL) 100 MG tablet Take 1-2 tablets (100-200 mg total) by mouth at bedtime as needed for sleep.   valACYclovir (VALTREX) 500 MG tablet Take 500 mg by mouth daily as needed (Flair up take Twice a day  for 3 days and stop).      Allergies:   Codeine, Penicillins, Divalproex sodium, Meloxicam, Sonata [zaleplon], Topamax [topiramate], Aciphex [rabeprazole], Aimovig [erenumab-aooe], and Galcanezumab-gnlm   ROS:   Please see the history of present illness.     All other systems reviewed and are  negative.   Labs/Other Tests and Data Reviewed:    Recent Labs: 03/26/2020: Magnesium 2.2; TSH 0.938 08/20/2020: ALT 33; BUN 15; Creatinine, Ser 0.87; Hemoglobin 14.7; Platelets 162; Potassium 4.4; Sodium 140   Recent Lipid Panel Lab Results  Component Value Date/Time   CHOL 158 03/26/2020 08:17 AM   TRIG 65 03/26/2020 08:17 AM   HDL 55 03/26/2020 08:17 AM   CHOLHDL 2.9 03/26/2020 08:17 AM   CHOLHDL 2.8 12/06/2015 06:09 AM   LDLCALC 90 03/26/2020 08:17 AM    Wt Readings from Last 3 Encounters:  08/19/20 176 lb (79.8 kg)  07/18/20 175 lb (79.4 kg)  07/16/20 173 lb (78.5 kg)     ASSESSMENT & PLAN:    Gastroenteritis Symptoms likely suggestive of viral gastroenteritis Advised to take Zofran as needed for nausea Adequate hydration Advised that most gastroenteritis including bacterial gastroenteritis are self resolving in nature, so would avoid antibiotics for now. Continue Levsin as prescribed by GI Contact GI  if persistent symptoms  Time:   Today, I have spent 9 minutes reviewing the chart, including problem list, medications, and with the patient with telehealth technology discussing the above problems.   Medication Adjustments/Labs and Tests Ordered: Current medicines are reviewed at length with the patient today.  Concerns regarding medicines are outlined above.   Tests Ordered: No orders of the defined types were placed in this encounter.   Medication Changes: No orders of the defined types were placed in this encounter.    Note: This dictation was prepared with Dragon dictation along with smaller phrase technology. Similar sounding words can be transcribed inadequately or may not be corrected upon review. Any transcriptional errors that result from this process are unintentional.      Disposition:  Follow up  Signed, Lindell Spar, MD  10/02/2020 4:35 PM     [place]

## 2020-10-02 NOTE — Patient Instructions (Addendum)
Please take Zofran as needed for nausea.  Please take Levsin for abdominal spasms and pain.  Please contact GI if you have persistent or worsening of symptoms.

## 2020-10-03 ENCOUNTER — Telehealth (INDEPENDENT_AMBULATORY_CARE_PROVIDER_SITE_OTHER): Payer: Medicare PPO | Admitting: Physician Assistant

## 2020-10-03 ENCOUNTER — Encounter: Payer: Self-pay | Admitting: Physician Assistant

## 2020-10-03 DIAGNOSIS — G4700 Insomnia, unspecified: Secondary | ICD-10-CM | POA: Diagnosis not present

## 2020-10-03 DIAGNOSIS — F429 Obsessive-compulsive disorder, unspecified: Secondary | ICD-10-CM

## 2020-10-03 DIAGNOSIS — F509 Eating disorder, unspecified: Secondary | ICD-10-CM | POA: Diagnosis not present

## 2020-10-03 DIAGNOSIS — F319 Bipolar disorder, unspecified: Secondary | ICD-10-CM | POA: Diagnosis not present

## 2020-10-03 DIAGNOSIS — F17200 Nicotine dependence, unspecified, uncomplicated: Secondary | ICD-10-CM

## 2020-10-03 DIAGNOSIS — F411 Generalized anxiety disorder: Secondary | ICD-10-CM

## 2020-10-03 NOTE — Progress Notes (Signed)
[facility]  Patient ID: Jennifer [name],  MRN: [id number]  PCP: Noreene Larsson, NP  Date of Evaluation: 10/03/2020 Time spent:30 minutes  Chief Complaint:  Chief Complaint   Anxiety; Depression; Insomnia; Follow-up      Virtual Visit via Telehealth  I connected with patient by a video enabled telemedicine application with their informed consent, and verified patient privacy and that I am speaking with the correct person using two identifiers.  I am private, in my office and the patient is at home.  I discussed the limitations, risks, security and privacy concerns of performing an evaluation and management service by video and the availability of in person appointments. I also discussed with the patient that there may be a patient responsible charge related to this service. The patient expressed understanding and agreed to proceed.   I discussed the assessment and treatment plan with the patient. The patient was provided an opportunity to ask questions and all were answered. The patient agreed with the plan and demonstrated an understanding of the instructions.   The patient was advised to call back or seek an in-person evaluation if the symptoms worsen or if the condition fails to improve as anticipated.  I provided 30 minutes of non-face-to-face time during this encounter.   HISTORY/CURRENT STATUS: HPI For routine med check.  Not sleeping well. Trouble going to sleep and staying asleep. She stopped the Trazodone because she didn't think she should take it every night.  Did not feel like it was helping sleep much either.  But after that she started crying more and now thinks the trazodone was helping more than she thought.  She is not taking naps during the day.  No caffeine after lunch time.  States she is taking all of her medications at lunch time.  She is still seeing her counselor, saying therapist for about a year now.  That is going well.  She has been doing well  for the most part except for when she stopped taking the trazodone she cried more easily.  Thinks some of that is due to not sleeping well however.  Energy and motivation are good most of the time.  She is able to enjoy things.  Not isolating.  Personal hygiene is normal.  No suicidal or homicidal thoughts.  Patient denies increased energy with decreased need for sleep, no increased talkativeness, no racing thoughts, no impulsivity or risky behaviors, no increased spending, no increased libido, no grandiosity, no increased irritability or anger, and no hallucinations.  Anxiety and OCD symptoms are controlled.  She does take the hydroxyzine for the anxiety at times, and the Klonopin is rare.  Both are helpful.  Denies dizziness, syncope, seizures, numbness, tingling, tremor, tics, unsteady gait, slurred speech, confusion. Denies muscle or joint pain, stiffness, or dystonia.  Individual Medical History/ Review of Systems: Changes? :No     Past medications for mental health diagnoses include: Risperdal, Seroquel, Prozac, Zoloft,  Wellbutrin, Lamictal, Depakote caused hair loss, Xanax, Ambien, trazodone, Trileptal, Luvox, Topamax, Elavil, Pamelor, BuSpar, doxazosin, prazosin, lithium, Vraylar, Abilify, [name], Latuda >60 mg caused abd pain and nausea  Allergies: Codeine, Penicillins, Divalproex sodium, Meloxicam, Sonata [zaleplon], Topamax [topiramate], Aciphex [rabeprazole], Aimovig [erenumab-aooe], and Galcanezumab-gnlm  Current Medications:  Current Outpatient Medications:    acetaminophen (TYLENOL) 500 MG tablet, Take 1,000 mg by mouth daily as needed for fever. , Disp: , Rfl:    Brexpiprazole ([name]) 4 MG TABS, Take 4 mg by mouth in the morning., Disp: 30 tablet,  Rfl: 11   clonazePAM (KLONOPIN) 0.5 MG tablet, Take 1 tablet (0.5 mg total) by mouth 2 (two) times daily as needed for anxiety., Disp: 20 tablet, Rfl: 5   Famotidine-Ca Carb-Mag Hydrox (PEPCID COMPLETE PO), Take 1 tablet by mouth  daily., Disp: , Rfl:    fluvoxaMINE (LUVOX) 100 MG tablet, Take 3 tablets (300 mg total) by mouth at bedtime., Disp: 270 tablet, Rfl: 3   gabapentin (NEURONTIN) 600 MG tablet, TAKE TWO TABLETS ('1200MG'$  TOTAL) BY MOUTHTWO TIMES DAILY, Disp: 120 tablet, Rfl: 5   hydrOXYzine (ATARAX/VISTARIL) 25 MG tablet, Take 1 tablet (25 mg total) by mouth 3 (three) times daily as needed., Disp: 60 tablet, Rfl: 11   hyoscyamine (LEVSIN) 0.125 MG tablet, Take 1 tablet (0.125 mg total) by mouth every 4 (four) hours as needed (diarrhea)., Disp: 120 tablet, Rfl: 1   ibuprofen (ADVIL) 600 MG tablet, Take 1 tablet (600 mg total) by mouth every 8 (eight) hours as needed., Disp: 30 tablet, Rfl: 0   loratadine (CLARITIN) 10 MG tablet, Take 5 mg by mouth daily as needed for allergies. , Disp: , Rfl:    naproxen (NAPROSYN) 500 MG tablet, Take 500 mg by mouth 2 (two) times daily as needed for migraine., Disp: , Rfl:    ondansetron (ZOFRAN-ODT) 4 MG disintegrating tablet, TAKE ONE TABLET ('4MG'$  TOTAL) BY MOUTH EVERY 8 HOURS AS NEEDED FOR NAUSEA OR VOMITING, Disp: 30 tablet, Rfl: 1   pramipexole (MIRAPEX) 0.5 MG tablet, Take 1 tablet (0.5 mg total) by mouth at bedtime., Disp: 90 tablet, Rfl: 1   Rimegepant Sulfate (NURTEC) 75 MG TBDP, Take 75 mg by mouth daily as needed (take for abortive therapy of migraine, no more than 1 tablet in 24 hours or 10 per month)., Disp: 10 tablet, Rfl: 11   sodium chloride (OCEAN NASAL SPRAY) 0.65 % nasal spray, Place 1 spray into the nose as needed for congestion., Disp: 30 mL, Rfl: 12   traZODone (DESYREL) 100 MG tablet, Take 1-2 tablets (100-200 mg total) by mouth at bedtime as needed for sleep., Disp: 60 tablet, Rfl: 11   valACYclovir (VALTREX) 500 MG tablet, Take 500 mg by mouth daily as needed (Flair up take Twice a day  for 3 days and stop). , Disp: , Rfl:    azithromycin (ZITHROMAX) 250 MG tablet, Take 2 tablets on day 1, then 1 tablet daily on days 2 through 5, Disp: 6 tablet, Rfl: 0    dicyclomine (BENTYL) 20 MG tablet, Take 1 tablet (20 mg total) by mouth 3 (three) times daily as needed for spasms (abdominal cramping)., Disp: 20 tablet, Rfl: 0   fluticasone (FLONASE) 50 MCG/ACT nasal spray, Place 1 spray into both nostrils daily for 14 days. (Patient taking differently: Place 1 spray into both nostrils as needed.), Disp: 16 g, Rfl: 0   predniSONE (STERAPRED UNI-PAK 21 TAB) 10 MG (21) TBPK tablet, Take as package instructions., Disp: 1 each, Rfl: 0 Medication Side Effects: none  Family Medical/ Social History: Changes? No  MENTAL HEALTH EXAM:  Last menstrual period 10/07/2011.There is no height or weight on file to calculate BMI.  General Appearance: Casual and Well Groomed  Eye Contact:  Good  Speech:  Clear and Coherent and Normal Rate  Volume:  Normal  Mood:  Euthymic  Affect:  Appropriate  Thought Process:  Goal Directed and Descriptions of Associations: Intact  Orientation:  Full (Time, Place, and Person)  Thought Content: Logical   Suicidal Thoughts:  No  Homicidal Thoughts:  No  Memory:  WNL  Judgement:  Good  Insight:  Good  Psychomotor Activity:  Normal  Concentration:  Concentration: Good and Attention Span: Good  Recall:  Good  Fund of Knowledge: Good  Language: Good  Assets:  Desire for Improvement  ADL's:  Intact  Cognition: WNL  Prognosis:  Good   Labs on 03/26/2020 CBC with differential normal CMP glucose 111 otherwise completely normal Lipid panel normal Hemoglobin A1c 5.7 Magnesium normal TSH normal Vitamin D normal  08/20/2020 CBC with differential normal Glucose 99 other values on CMP were normal.  DIAGNOSES:    ICD-10-CM   1. Insomnia, unspecified type  G47.00     2. Obsessive-compulsive disorder, unspecified type  F42.9     3. Bipolar I disorder ([place])  F31.9     4. Eating disorder, unspecified type  F50.9     5. Generalized anxiety disorder  F41.1     6. Smoker  F17.200        Receiving Psychotherapy: [facility]   RECOMMENDATIONS:  PDMP was reviewed.  Last Klonopin filled 04/17/2020. I provided 30 minutes of non-face-to-face time during this encounter, including time spent before and after the visit in records review, medical decision making, and charting.  Discussed how she is taking her medications.  She should take the Luvox about 1 to 2 hours before she wants to go to sleep.  Also take 2 of the gabapentin at the same time.  Recommend she go ahead and take trazodone 200 mg at that same time.  Sleep hygiene was discussed. No changes in medications will be made. Continue [name] 4 mg, 1 p.o. daily. Continue Klonopin 0.5 mg, 1 p.o. twice daily as needed. Continue Luvox 100 mg, 3 p.o. nightly. Continue gabapentin 600 mg, 2 p.o. twice daily. Continue hydroxyzine 25 mg, 1 p.o. 3 times daily as needed. Continue trazodone 100 mg, 1-2 nightly as needed sleep. Continue therapy. Return in 2 months.  [name], PA-C

## 2020-10-09 DIAGNOSIS — F4310 Post-traumatic stress disorder, unspecified: Secondary | ICD-10-CM | POA: Diagnosis not present

## 2020-10-09 DIAGNOSIS — F429 Obsessive-compulsive disorder, unspecified: Secondary | ICD-10-CM | POA: Diagnosis not present

## 2020-10-09 DIAGNOSIS — F3131 Bipolar disorder, current episode depressed, mild: Secondary | ICD-10-CM | POA: Diagnosis not present

## 2020-10-09 DIAGNOSIS — F502 Bulimia nervosa: Secondary | ICD-10-CM | POA: Diagnosis not present

## 2020-10-11 ENCOUNTER — Other Ambulatory Visit: Payer: Self-pay

## 2020-10-11 ENCOUNTER — Ambulatory Visit (INDEPENDENT_AMBULATORY_CARE_PROVIDER_SITE_OTHER): Payer: Medicare PPO

## 2020-10-11 ENCOUNTER — Ambulatory Visit (INDEPENDENT_AMBULATORY_CARE_PROVIDER_SITE_OTHER): Payer: Medicare PPO | Admitting: Internal Medicine

## 2020-10-11 ENCOUNTER — Encounter: Payer: Self-pay | Admitting: Internal Medicine

## 2020-10-11 ENCOUNTER — Ambulatory Visit (HOSPITAL_COMMUNITY)
Admission: RE | Admit: 2020-10-11 | Discharge: 2020-10-11 | Disposition: A | Discharge status: Home / Self Care | Payer: Medicare PPO | Source: Ambulatory Visit | Attending: Internal Medicine | Admitting: Internal Medicine

## 2020-10-11 DIAGNOSIS — R0789 Other chest pain: Secondary | ICD-10-CM | POA: Diagnosis not present

## 2020-10-11 DIAGNOSIS — R059 Cough, unspecified: Secondary | ICD-10-CM | POA: Diagnosis not present

## 2020-10-11 DIAGNOSIS — Z0000 Encounter for general adult medical examination without abnormal findings: Secondary | ICD-10-CM

## 2020-10-11 DIAGNOSIS — J209 Acute bronchitis, unspecified: Secondary | ICD-10-CM | POA: Insufficient documentation

## 2020-10-11 MED ORDER — PREDNISONE 10 MG (21) PO TBPK: ORAL_TABLET | ORAL | 0 refills | Status: DC

## 2020-10-11 MED ORDER — AZITHROMYCIN 250 MG PO TABS: ORAL_TABLET | ORAL | 0 refills | Status: AC

## 2020-10-11 NOTE — Patient Instructions (Signed)
Health Maintenance, Female Adopting a healthy lifestyle and getting preventive care are important in promoting health and wellness. Ask your health care provider about: The right schedule for you to have regular tests and exams. Things you can do on your own to prevent diseases and keep yourself healthy. What should I know about diet, weight, and exercise? Eat a healthy diet  Eat a diet that includes plenty of vegetables, fruits, low-fat dairy products, and lean protein. Do not eat a lot of foods that are high in solid fats, added sugars, or sodium. Maintain a healthy weight Body mass index (BMI) is used to identify weight problems. It estimates body fat based on height and weight. Your health care provider can help determine your BMI and help you achieve or maintain a healthy weight. Get regular exercise Get regular exercise. This is one of the most important things you can do for your health. Most adults should: Exercise for at least 150 minutes each week. The exercise should increase your heart rate and make you sweat (moderate-intensity exercise). Do strengthening exercises at least twice a week. This is in addition to the moderate-intensity exercise. Spend less time sitting. Even light physical activity can be beneficial. Watch cholesterol and blood lipids Have your blood tested for lipids and cholesterol at 55 years of age, then have this test every 5 years. Have your cholesterol levels checked more often if: Your lipid or cholesterol levels are high. You are older than 55 years of age. You are at high risk for heart disease. What should I know about cancer screening? Depending on your health history and family history, you may need to have cancer screening at various ages. This may include screening for: Breast cancer. Cervical cancer. Colorectal cancer. Skin cancer. Lung cancer. What should I know about heart disease, diabetes, and high blood pressure? Blood pressure and heart  disease High blood pressure causes heart disease and increases the risk of stroke. This is more likely to develop in people who have high blood pressure readings, are of African descent, or are overweight. Have your blood pressure checked: Every 3-5 years if you are 18-39 years of age. Every year if you are 40 years old or older. Diabetes Have regular diabetes screenings. This checks your fasting blood sugar level. Have the screening done: Once every three years after age 40 if you are at a normal weight and have a low risk for diabetes. More often and at a younger age if you are overweight or have a high risk for diabetes. What should I know about preventing infection? Hepatitis B If you have a higher risk for hepatitis B, you should be screened for this virus. Talk with your health care provider to find out if you are at risk for hepatitis B infection. Hepatitis C Testing is recommended for: Everyone born from 1945 through 1965. Anyone with known risk factors for hepatitis C. Sexually transmitted infections (STIs) Get screened for STIs, including gonorrhea and chlamydia, if: You are sexually active and are younger than 55 years of age. You are older than 55 years of age and your health care provider tells you that you are at risk for this type of infection. Your sexual activity has changed since you were last screened, and you are at increased risk for chlamydia or gonorrhea. Ask your health care provider if you are at risk. Ask your health care provider about whether you are at high risk for HIV. Your health care provider may recommend a prescription medicine   to help prevent HIV infection. If you choose to take medicine to prevent HIV, you should first get tested for HIV. You should then be tested every 3 months for as long as you are taking the medicine. Pregnancy If you are about to stop having your period (premenopausal) and you may become pregnant, seek counseling before you get  pregnant. Take 400 to 800 micrograms (mcg) of folic acid every day if you become pregnant. Ask for birth control (contraception) if you want to prevent pregnancy. Osteoporosis and menopause Osteoporosis is a disease in which the bones lose minerals and strength with aging. This can result in bone fractures. If you are 65 years old or older, or if you are at risk for osteoporosis and fractures, ask your health care provider if you should: Be screened for bone loss. Take a calcium or vitamin D supplement to lower your risk of fractures. Be given hormone replacement therapy (HRT) to treat symptoms of menopause. Follow these instructions at home: Lifestyle Do not use any products that contain nicotine or tobacco, such as cigarettes, e-cigarettes, and chewing tobacco. If you need help quitting, ask your health care provider. Do not use street drugs. Do not share needles. Ask your health care provider for help if you need support or information about quitting drugs. Alcohol use Do not drink alcohol if: Your health care provider tells you not to drink. You are pregnant, may be pregnant, or are planning to become pregnant. If you drink alcohol: Limit how much you use to 0-1 drink a day. Limit intake if you are breastfeeding. Be aware of how much alcohol is in your drink. In the [place] one drink equals one 12 oz bottle of beer (355 mL), one 5 oz glass of wine (148 mL), or one 1 oz glass of hard liquor (44 mL). General instructions Schedule regular health, dental, and eye exams. Stay current with your vaccines. Tell your health care provider if: You often feel depressed. You have ever been abused or do not feel safe at home. Summary Adopting a healthy lifestyle and getting preventive care are important in promoting health and wellness. Follow your health care provider's instructions about healthy diet, exercising, and getting tested or screened for diseases. Follow your health care provider's  instructions on monitoring your cholesterol and blood pressure. This information is not intended to replace advice given to you by your health care provider. Make sure you discuss any questions you have with your health care provider. Document Revised: 03/15/2020 Document Reviewed: 12/29/2017 Elsevier Patient Education  2022 Elsevier Inc.  

## 2020-10-11 NOTE — Progress Notes (Signed)
Virtual Visit via Telephone Note   This visit type was conducted due to national recommendations for restrictions regarding the COVID-19 Pandemic (e.g. social distancing) in an effort to limit this patient's exposure and mitigate transmission in our community.  Due to her co-morbid illnesses, this patient is at least at moderate risk for complications without adequate follow up.  This format is felt to be most appropriate for this patient at this time.  The patient did not have access to video technology/had technical difficulties with video requiring transitioning to audio format only (telephone).  All issues noted in this document were discussed and addressed.  No physical exam could be performed with this format.  Evaluation Performed:  Follow-up visit  Date:  10/11/2020   ID:  Jennifer, [name] [birth year], MRN [id number]  Patient Location: Home Provider Location: Office/Clinic  Participants: Patient Location of Patient: Home Location of Provider: Telehealth Consent was obtain for visit to be over via telehealth. I verified that I am speaking with the correct person using two identifiers.  PCP:  Noreene Larsson, NP   Chief Complaint:  Cough and chest tightness  History of Present Illness:    Jennifer [name] is a 55 y.o. female for who has a televisit for complaint of cough, chest congestion and nasal congestion for last 2 days.  She had negative COVID test at home.  She has soreness in the chest with excessive coughing.  She denies any fever, chills, nausea or vomiting currently.  The patient does not have symptoms concerning for COVID-19 infection (fever, chills, cough, or new shortness of breath).   Past Medical, Surgical, Social History, Allergies, and Medications have been Reviewed.  Past Medical History:  Diagnosis Date   Abdominal pain, epigastric 10/07/2018   Acute non-recurrent maxillary sinusitis 11/07/2019   Anxiety    Binge-eating and purging type anorexia nervosa     Bipolar affective disorder ([place]) 12/04/2015   Bipolar affective disorder, current episode manic without psychotic symptoms ([place]) 12/04/2015   Bipolar disorder ([place])    Cellulitis and abscess of buttock 02/15/2013   Cystitis, interstitial    Depression    Depression    Phreesia 01/28/2020   Esophageal polyp    about 20 years ago   GERD (gastroesophageal reflux disease)    Left wrist pain 03/22/2019   OCD (obsessive compulsive disorder)    Palpitations 05/25/2014   Rectal pain 12/06/2018   Rib pain on right side 01/31/2019   Rosacea 03/27/2019   Sprain of temporomandibular joint or ligament 01/10/2019   Past Surgical History:  Procedure Laterality Date   BLADDER SURGERY     CHOLECYSTECTOMY     COLONOSCOPY WITH PROPOFOL N/A 10/05/2019   Procedure: COLONOSCOPY WITH PROPOFOL;  Surgeon: Daneil Dolin, MD;  [facility];  Service: Endoscopy;  Laterality: N/A;  12:45pm   COSMETIC SURGERY N/A    Phreesia 01/28/2020   ESOPHAGOGASTRODUODENOSCOPY  09/28/2016   [facility]; Dr. Therisa Doyne; erosions in the esophagus, 5 cm hiatal hernia, nonbleeding erosive gastropathy s/p biopsied, normal duodenum.  Path with chronic inactive gastritis, no H. pylori or intestinal metaplasia.    ESOPHAGOGASTRODUODENOSCOPY (EGD) WITH PROPOFOL N/A 10/17/2018   Dr. Gala Romney: mild reflux esophagitis, small hiatal hernia   POLYPECTOMY  10/05/2019   Procedure: POLYPECTOMY;  Surgeon: Daneil Dolin, MD;  Location: [facility];  Service: Endoscopy;;   VOCAL CORD LATERALIZATION, ENDOSCOPIC APPROACH W/ MLB       Current Meds  Medication Sig  acetaminophen (TYLENOL) 500 MG tablet Take 1,000 mg by mouth daily as needed for fever.    Brexpiprazole ([name]) 4 MG TABS Take 4 mg by mouth in the morning.   clonazePAM (KLONOPIN) 0.5 MG tablet Take 1 tablet (0.5 mg total) by mouth 2 (two) times daily as needed for anxiety.   dicyclomine (BENTYL) 20 MG tablet Take 1 tablet (20 mg total) by mouth 3 (three) times daily as needed  for spasms (abdominal cramping).   Famotidine-Ca Carb-Mag Hydrox (PEPCID COMPLETE PO) Take 1 tablet by mouth daily.   fluvoxaMINE (LUVOX) 100 MG tablet Take 3 tablets (300 mg total) by mouth at bedtime.   gabapentin (NEURONTIN) 600 MG tablet TAKE TWO TABLETS (1200MG  TOTAL) BY MOUTHTWO TIMES DAILY   hydrOXYzine (ATARAX/VISTARIL) 25 MG tablet Take 1 tablet (25 mg total) by mouth 3 (three) times daily as needed.   hyoscyamine (LEVSIN) 0.125 MG tablet Take 1 tablet (0.125 mg total) by mouth every 4 (four) hours as needed (diarrhea).   ibuprofen (ADVIL) 600 MG tablet Take 1 tablet (600 mg total) by mouth every 8 (eight) hours as needed.   loratadine (CLARITIN) 10 MG tablet Take 5 mg by mouth daily as needed for allergies.    naproxen (NAPROSYN) 500 MG tablet Take 500 mg by mouth 2 (two) times daily as needed for migraine.   ondansetron (ZOFRAN-ODT) 4 MG disintegrating tablet TAKE ONE TABLET (4MG  TOTAL) BY MOUTH EVERY 8 HOURS AS NEEDED FOR NAUSEA OR VOMITING   pramipexole (MIRAPEX) 0.5 MG tablet Take 1 tablet (0.5 mg total) by mouth at bedtime.   Rimegepant Sulfate (NURTEC) 75 MG TBDP Take 75 mg by mouth daily as needed (take for abortive therapy of migraine, no more than 1 tablet in 24 hours or 10 per month).   sodium chloride (OCEAN NASAL SPRAY) 0.65 % nasal spray Place 1 spray into the nose as needed for congestion.   traZODone (DESYREL) 100 MG tablet Take 1-2 tablets (100-200 mg total) by mouth at bedtime as needed for sleep.   valACYclovir (VALTREX) 500 MG tablet Take 500 mg by mouth daily as needed (Flair up take Twice a day  for 3 days and stop).      Allergies:   Codeine, Penicillins, Divalproex sodium, Meloxicam, Sonata [zaleplon], Topamax [topiramate], Aciphex [rabeprazole], Aimovig [erenumab-aooe], and Galcanezumab-gnlm   ROS:   Please see the history of present illness.     All other systems reviewed and are negative.   Labs/Other Tests and Data Reviewed:    Recent Labs: 03/26/2020:  Magnesium 2.2; TSH 0.938 08/20/2020: ALT 33; BUN 15; Creatinine, Ser 0.87; Hemoglobin 14.7; Platelets 162; Potassium 4.4; Sodium 140   Recent Lipid Panel Lab Results  Component Value Date/Time   CHOL 158 03/26/2020 08:17 AM   TRIG 65 03/26/2020 08:17 AM   HDL 55 03/26/2020 08:17 AM   CHOLHDL 2.9 03/26/2020 08:17 AM   CHOLHDL 2.8 12/06/2015 06:09 AM   LDLCALC 90 03/26/2020 08:17 AM    Wt Readings from Last 3 Encounters:  08/19/20 176 lb (79.8 kg)  07/18/20 175 lb (79.4 kg)  07/16/20 173 lb (78.5 kg)     ASSESSMENT & PLAN:    Acute bronchitis Started Sterapred taper, continue Pepcid while on steroid Started azithromycin Check CXR Mucinex or Robitussin as needed for cough  Time:   Today, I have spent 13 minutes reviewing the chart, including problem list, medications, and with the patient with telehealth technology discussing the above problems.   Medication Adjustments/Labs and Tests Ordered: Current  medicines are reviewed at length with the patient today.  Concerns regarding medicines are outlined above.   Tests Ordered: No orders of the defined types were placed in this encounter.   Medication Changes: No orders of the defined types were placed in this encounter.    Note: This dictation was prepared with Dragon dictation along with smaller phrase technology. Similar sounding words can be transcribed inadequately or may not be corrected upon review. Any transcriptional errors that result from this process are unintentional.      Disposition:  Follow up  Signed, Lindell Spar, MD  10/11/2020 9:57 AM     [place]

## 2020-10-11 NOTE — Progress Notes (Signed)
Subjective:   Jennifer [name] is a 55 y.o. female who presents for an Initial Medicare Annual Wellness Visit. I connected with  ZSAZSA BAHENA on 10/11/20 by a audio enabled telemedicine application and verified that I am speaking with the correct person using two identifiers.   I discussed the limitations of evaluation and management by telemedicine. The patient expressed understanding and agreed to proceed.   Location of patient:Home  Location of Provider:Office  Persons participating in virtual visit: Jennifer [name] (patient) and Edgar Frisk, CMA Review of Systems    Defer to PCP        Objective:    There were no vitals filed for this visit. There is no height or weight on file to calculate BMI.  Advanced Directives 10/11/2020 10/05/2019 04/06/2019 10/31/2018 10/26/2018 10/26/2018 10/17/2018  Does Patient Have a Medical Advance Directive? No No No No No No No  Would patient like information on creating a medical advance directive? - Yes (MAU/Ambulatory/Procedural Areas - Information given) - No - Patient declined No - Patient declined No - Patient declined No - Patient declined  Some encounter information is confidential and restricted. Go to Review Flowsheets activity to see all data.    Current Medications (verified) Outpatient Encounter Medications as of 10/11/2020  Medication Sig   acetaminophen (TYLENOL) 500 MG tablet Take 1,000 mg by mouth daily as needed for fever.    azithromycin (ZITHROMAX) 250 MG tablet Take 2 tablets on day 1, then 1 tablet daily on days 2 through 5   Brexpiprazole ([name]) 4 MG TABS Take 4 mg by mouth in the morning.   clonazePAM (KLONOPIN) 0.5 MG tablet Take 1 tablet (0.5 mg total) by mouth 2 (two) times daily as needed for anxiety.   dicyclomine (BENTYL) 20 MG tablet Take 1 tablet (20 mg total) by mouth 3 (three) times daily as needed for spasms (abdominal cramping).   Famotidine-Ca Carb-Mag Hydrox (PEPCID COMPLETE PO) Take 1 tablet by mouth daily.    fluvoxaMINE (LUVOX) 100 MG tablet Take 3 tablets (300 mg total) by mouth at bedtime.   gabapentin (NEURONTIN) 600 MG tablet TAKE TWO TABLETS (1200MG  TOTAL) BY MOUTHTWO TIMES DAILY   hydrOXYzine (ATARAX/VISTARIL) 25 MG tablet Take 1 tablet (25 mg total) by mouth 3 (three) times daily as needed.   hyoscyamine (LEVSIN) 0.125 MG tablet Take 1 tablet (0.125 mg total) by mouth every 4 (four) hours as needed (diarrhea).   ibuprofen (ADVIL) 600 MG tablet Take 1 tablet (600 mg total) by mouth every 8 (eight) hours as needed.   loratadine (CLARITIN) 10 MG tablet Take 5 mg by mouth daily as needed for allergies.    naproxen (NAPROSYN) 500 MG tablet Take 500 mg by mouth 2 (two) times daily as needed for migraine.   ondansetron (ZOFRAN-ODT) 4 MG disintegrating tablet TAKE ONE TABLET (4MG  TOTAL) BY MOUTH EVERY 8 HOURS AS NEEDED FOR NAUSEA OR VOMITING   pramipexole (MIRAPEX) 0.5 MG tablet Take 1 tablet (0.5 mg total) by mouth at bedtime.   predniSONE (STERAPRED UNI-PAK 21 TAB) 10 MG (21) TBPK tablet Take as package instructions.   Rimegepant Sulfate (NURTEC) 75 MG TBDP Take 75 mg by mouth daily as needed (take for abortive therapy of migraine, no more than 1 tablet in 24 hours or 10 per month).   sodium chloride (OCEAN NASAL SPRAY) 0.65 % nasal spray Place 1 spray into the nose as needed for congestion.   traZODone (DESYREL) 100 MG tablet Take 1-2 tablets (100-200 mg total) by  mouth at bedtime as needed for sleep.   valACYclovir (VALTREX) 500 MG tablet Take 500 mg by mouth daily as needed (Flair up take Twice a day  for 3 days and stop).    fluticasone (FLONASE) 50 MCG/ACT nasal spray Place 1 spray into both nostrils daily for 14 days. (Patient taking differently: Place 1 spray into both nostrils as needed.)   No facility-administered encounter medications on file as of 10/11/2020.    Allergies (verified) Codeine, Penicillins, Divalproex sodium, Meloxicam, Sonata [zaleplon], Topamax [topiramate], Aciphex  [rabeprazole], Aimovig [erenumab-aooe], and Galcanezumab-gnlm   History: Past Medical History:  Diagnosis Date   Abdominal pain, epigastric 10/07/2018   Acute non-recurrent maxillary sinusitis 11/07/2019   Anxiety    Binge-eating and purging type anorexia nervosa    Bipolar affective disorder ([place]) 12/04/2015   Bipolar affective disorder, current episode manic without psychotic symptoms ([place]) 12/04/2015   Bipolar disorder ([place])    Cellulitis and abscess of buttock 02/15/2013   Cystitis, interstitial    Depression    Depression    Phreesia 01/28/2020   Esophageal polyp    about 20 years ago   GERD (gastroesophageal reflux disease)    Left wrist pain 03/22/2019   OCD (obsessive compulsive disorder)    Palpitations 05/25/2014   Rectal pain 12/06/2018   Rib pain on right side 01/31/2019   Rosacea 03/27/2019   Sprain of temporomandibular joint or ligament 01/10/2019   Past Surgical History:  Procedure Laterality Date   BLADDER SURGERY     CHOLECYSTECTOMY     COLONOSCOPY WITH PROPOFOL N/A 10/05/2019   Procedure: COLONOSCOPY WITH PROPOFOL;  Surgeon: Daneil Dolin, MD;  [facility];  Service: Endoscopy;  Laterality: N/A;  12:45pm   COSMETIC SURGERY N/A    Phreesia 01/28/2020   ESOPHAGOGASTRODUODENOSCOPY  09/28/2016   [facility]; Dr. Therisa Doyne; erosions in the esophagus, 5 cm hiatal hernia, nonbleeding erosive gastropathy s/p biopsied, normal duodenum.  Path with chronic inactive gastritis, no H. pylori or intestinal metaplasia.    ESOPHAGOGASTRODUODENOSCOPY (EGD) WITH PROPOFOL N/A 10/17/2018   Dr. Gala Romney: mild reflux esophagitis, small hiatal hernia   POLYPECTOMY  10/05/2019   Procedure: POLYPECTOMY;  Surgeon: Daneil Dolin, MD;  Location: [facility];  Service: Endoscopy;;   VOCAL CORD LATERALIZATION, ENDOSCOPIC APPROACH W/ MLB     Family History  Problem Relation Age of Onset   Healthy Mother    Healthy Father    Colon cancer Neg Hx    Colon polyps Neg Hx    Social History    Socioeconomic History   Marital status: Married    Spouse name: Todd   Number of children: 0   Years of education: Not on file   Highest education level: Not on file  Occupational History   Not on file  Tobacco Use   Smoking status: Every Day    Packs/day: 1.00    Years: 20.00    Pack years: 20.00    Types: Cigarettes    Start date: 01/20/1988   Smokeless tobacco: Never  Vaping Use   Vaping Use: Never used  Substance and Sexual Activity   Alcohol use: No    Alcohol/week: 0.0 standard drinks   Drug use: No   Sexual activity: Yes    Birth control/protection: None  Other Topics Concern   Not on file  Social History Narrative   Lives with husband -[name] of 57 years       Yorkie- Max      Enjoys: reading-all  genres      Diet: eats all food groups   Caffeine: 1 cup daily at times, diet dr pepper daily   Water: gatorade  zero and water       Wears seat belt    Does not use phone while driving   Smoke and Development worker, 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         Social Determinants of Radio broadcast assistant Strain: Low Risk    Difficulty of Paying Living Expenses: Not hard at all  Food Insecurity: No Food Insecurity   Worried About Charity fundraiser in the Last Year: Never true   Arboriculturist in the Last Year: Never true  Transportation Needs: No Transportation Needs   Lack of Transportation (Medical): No   Lack of Transportation (Non-Medical): No  Physical Activity: Insufficiently Active   Days of Exercise per Week: 7 days   Minutes of Exercise per Session: 20 min  Stress: Stress Concern Present   Feeling of Stress : To some extent  Social Connections: Moderately Integrated   Frequency of Communication with Friends and Family: More than three times a week   Frequency of Social Gatherings with Friends and Family: Once a week   Attends Religious Services: More than 4 times per year   Active Member of [employer] or Organizations: No    Attends Music therapist: Never   Marital Status: Married    Tobacco Counseling Ready to quit: Not Answered Counseling given: Not Answered   Clinical Intake:  Pre-visit preparation completed: Yes  Pain : 0-10 Pain Type: Acute pain Pain Location: Chest (having an x-ray)     Diabetes: No  How often do you need to have someone help you when you read instructions, pamphlets, or other written materials from your doctor or pharmacy?: 3 - Sometimes What is the last grade level you completed in school?: BS Degree  Diabetic?no  Interpreter Needed?: No  Information entered by :: Sultan Pargas J,CMA   Activities of Daily Living In your present state of health, do you have any difficulty performing the following activities: 10/11/2020  Hearing? N  Vision? N  Difficulty concentrating or making decisions? Y  Walking or climbing stairs? N  Dressing or bathing? N  Doing errands, shopping? N  Preparing Food and eating ? N  Using the Toilet? N  In the past six months, have you accidently leaked urine? Y  Do you have problems with loss of bowel control? N  Managing your Medications? N  Managing your Finances? N  Housekeeping or managing your Housekeeping? N  Some recent data might be hidden    Patient Care Team: Noreene Larsson, NP as PCP - General (Nurse Practitioner) Daneil Dolin, MD as Consulting Physician (Gastroenterology)  Indicate any recent Medical Services you may have received from other than [facility] providers in the past year (date may be approximate).     Assessment:   This is a routine wellness examination for Jennifer [name].  Hearing/Vision screen No results found.  Dietary issues and exercise activities discussed: Current Exercise Habits: Home exercise routine, Type of exercise: walking, Time (Minutes): 20, Frequency (Times/Week): 7, Weekly Exercise (Minutes/Week): 140, Intensity: Mild   Goals Addressed   None    Depression Screen PHQ 2/9 Scores 10/11/2020  10/02/2020 08/19/2020 07/18/2020 06/13/2020 05/17/2020 03/21/2020  PHQ - 2 Score 1 0 4 1 2 1 2   PHQ- 9 Score 1 - 17 10  10 4 12   Some encounter information is confidential and restricted. Go to Review Flowsheets activity to see all data.    Fall Risk Fall Risk  10/11/2020 10/02/2020 08/19/2020 07/18/2020 06/13/2020  Falls in the past year? 0 0 0 0 0  Number falls in past yr: 0 0 0 0 0  Injury with Fall? 0 0 0 0 0  Risk for fall due to : No Fall Risks No Fall Risks No Fall Risks No Fall Risks No Fall Risks  Follow up Falls evaluation completed Falls evaluation completed Falls evaluation completed Falls evaluation completed Falls evaluation completed    [place]:  Any stairs in or around the home? Yes  If so, are there any without handrails? No  Home free of loose throw rugs in walkways, pet beds, electrical cords, etc? Yes  Adequate lighting in your home to reduce risk of falls? Yes   ASSISTIVE DEVICES UTILIZED TO PREVENT FALLS:  Life alert? No  Use of a cane, walker or w/c? No  Grab bars in the bathroom? Yes  Shower chair or bench in shower? No  Elevated toilet seat or a handicapped toilet? No   TIMED UP AND GO:  Was the test performed?  N/A .  Length of time to ambulate 10 feet: N/A sec.     Cognitive Function:     6CIT Screen 10/11/2020  What Year? 0 points  What month? 0 points  What time? 0 points  Count back from 20 0 points  Months in reverse 0 points  Repeat phrase 0 points  Total Score 0    Immunizations Immunization History  Administered Date(s) Administered   Influenza,inj,Quad PF,6+ Mos 11/26/2019   Influenza,inj,quad, With Preservative 10/19/2018   Influenza-Unspecified 08/25/2013   Moderna Sars-Covid-2 Vaccination 04/01/2019, 05/03/2019, 11/26/2019, 07/19/2020   Zoster Recombinat (Shingrix) 11/23/2018    TDAP status: Due, Education has been provided regarding the importance of this vaccine. Advised may receive this vaccine at  local pharmacy or Health Dept. Aware to provide a copy of the vaccination record if obtained from local pharmacy or Health Dept. Verbalized acceptance and understanding.  Flu Vaccine status: Due, Education has been provided regarding the importance of this vaccine. Advised may receive this vaccine at local pharmacy or Health Dept. Aware to provide a copy of the vaccination record if obtained from local pharmacy or Health Dept. Verbalized acceptance and understanding.    Covid-19 vaccine status: Completed vaccines  Qualifies for Shingles Vaccine? Yes   Zostavax completed No   Shingrix Completed?: Yes  Screening Tests Health Maintenance  Topic Date Due   HIV Screening  Never done   PAP SMEAR-Modifier  05/23/2016   MAMMOGRAM  06/21/2020   INFLUENZA VACCINE  08/19/2020   Zoster Vaccines- Shingrix (2 of 2) 10/18/2020 (Originally 01/18/2019)   TETANUS/TDAP  07/18/2021 (Originally 09/02/1984)   COVID-19 Vaccine (5 - Booster for Moderna series) 11/19/2020   COLONOSCOPY (Pts 45-45yrs Insurance coverage will need to be confirmed)  10/04/2029   Hepatitis C Screening  Completed   HPV VACCINES  Aged Out    Health Maintenance  Health Maintenance Due  Topic Date Due   HIV Screening  Never done   PAP SMEAR-Modifier  05/23/2016   MAMMOGRAM  06/21/2020   INFLUENZA VACCINE  08/19/2020    Colorectal cancer screening: Type of screening: Colonoscopy. Completed 9/16/20021. Repeat every 10 years  Mammogram status: Completed 06/22/2018. Repeat every year Patient reported she has Mammogram done 09/20/2020  Lung Cancer Screening: (Low Dose CT Chest recommended if Age 72-80 years, 30 pack-year currently smoking OR have quit w/in 15years.) does qualify.   Lung Cancer Screening Referral: no  Additional Screening:  Hepatitis C Screening: does qualify; Completed 11/26/2015  Vision Screening: Recommended annual ophthalmology exams for early detection of glaucoma and other disorders of the eye. Is the  patient up to date with their annual eye exam?  No  Who is the provider or what is the name of the office in which the patient attends annual eye exams? [facility]  If pt is not established with a provider, would they like to be referred to a provider to establish care? No .   Dental Screening: Recommended annual dental exams for proper oral hygiene  Community Resource Referral / Chronic Care Management: CRR required this visit?  No   CCM required this visit?  No      Plan:     I have personally reviewed and noted the following in the patient's chart:   Medical and social history Use of alcohol, tobacco or illicit drugs  Current medicationPatient is not currently taking opioid prescriptions.s and supplements including opioid prescriptions.  Functional ability and status Nutritional status Physical activity Advanced directives List of other physicians Hospitalizations, surgeries, and ER visits in previous 12 months Vitals Screenings to include cognitive, depression, and falls Referrals and appointments  In addition, I have reviewed and discussed with patient certain preventive protocols, quality metrics, and best practice recommendations. A written personalized care plan for preventive services as well as general preventive health recommendations were provided to patient.     Edgar Frisk, [facility]   10/11/2020   Nurse Notes: Non Face to Face 30 minute visit     Ms. Nyra Capes , Thank you for taking time to come for your Medicare Wellness Visit. I appreciate your ongoing commitment to your health goals. Please review the following plan we discussed and let me know if I can assist you in the future.   These are the goals we discussed:  Goals   None     This is a list of the screening recommended for you and due dates:  Health Maintenance  Topic Date Due   HIV Screening  Never done   Pap Smear  05/23/2016   Mammogram  06/21/2020   Flu Shot  08/19/2020   Zoster (Shingles)  Vaccine (2 of 2) 10/18/2020*   Tetanus Vaccine  07/18/2021*   COVID-19 Vaccine (5 - Booster for Moderna series) 11/19/2020   Colon Cancer Screening  10/04/2029   Hepatitis C Screening: USPSTF Recommendation to screen - Ages 18-79 yo.  Completed   HPV Vaccine  Aged Out  *Topic was postponed. The date shown is not the original due date.

## 2020-10-15 ENCOUNTER — Other Ambulatory Visit: Payer: Self-pay

## 2020-10-15 ENCOUNTER — Emergency Department (HOSPITAL_COMMUNITY)
Admission: EM | Admit: 2020-10-15 | Discharge: 2020-10-15 | Discharge status: Against Medical Advice | Payer: Medicare PPO

## 2020-10-16 DIAGNOSIS — F429 Obsessive-compulsive disorder, unspecified: Secondary | ICD-10-CM | POA: Diagnosis not present

## 2020-10-16 DIAGNOSIS — F3131 Bipolar disorder, current episode depressed, mild: Secondary | ICD-10-CM | POA: Diagnosis not present

## 2020-10-16 DIAGNOSIS — F4310 Post-traumatic stress disorder, unspecified: Secondary | ICD-10-CM | POA: Diagnosis not present

## 2020-10-16 DIAGNOSIS — F502 Bulimia nervosa: Secondary | ICD-10-CM | POA: Diagnosis not present

## 2020-10-18 ENCOUNTER — Other Ambulatory Visit: Payer: Self-pay

## 2020-10-18 ENCOUNTER — Encounter: Payer: Self-pay | Admitting: Internal Medicine

## 2020-10-18 ENCOUNTER — Ambulatory Visit: Payer: Medicare PPO | Admitting: Internal Medicine

## 2020-10-18 VITALS — BP 115/75 | HR 86 | Temp 97.3°F | Ht 64.0 in | Wt 178.0 lb

## 2020-10-18 DIAGNOSIS — R7989 Other specified abnormal findings of blood chemistry: Secondary | ICD-10-CM | POA: Diagnosis not present

## 2020-10-18 DIAGNOSIS — K2100 Gastro-esophageal reflux disease with esophagitis, without bleeding: Secondary | ICD-10-CM | POA: Diagnosis not present

## 2020-10-18 DIAGNOSIS — R1013 Epigastric pain: Secondary | ICD-10-CM

## 2020-10-18 DIAGNOSIS — F502 Bulimia nervosa: Secondary | ICD-10-CM | POA: Diagnosis not present

## 2020-10-18 NOTE — Patient Instructions (Addendum)
It was good to see you today exclamation  GERD information provided  Continue Pepcid complete 40 mg twice daily  Try reflux [email or web address].  Take a dose immediately after eating as needed.  Look into getting a foam wedge for your side of the bed at [facility]  Weight loss and exercise very important.  Hepatic function profile  Right upper quadrant ultrasound-epigastric pain elevated LFTs  Absolutely continue with cognitive behavioral therapy.  Get that sleep study!  Office visit with [place] in 3 months

## 2020-10-18 NOTE — Progress Notes (Signed)
Primary Care Physician:  Noreene Larsson, NP Primary Gastroenterologist:  Dr. Gala Romney  Pre-Procedure History & Physical: HPI:  Jennifer [name] is a 55 y.o. female here for follow-up GERD/dyspepsia.  Patient had an episode of what she describes as severe abdominal pain/epigastric pain, reflux early in the week with urgent care and ED visit.  Because of the long wait,  she was never seen anywhere.  Symptoms subsided.  She does have nocturnal reflux symptoms no dysphagia.  Intolerant about all the PPIs.  Has been taking Pepcid Complete twice daily.  Stools get black after she takes Pepto-Bismol but otherwise not.  No hematochezia.  Up-to-date on EGD and colonoscopy;  gallbladder out.  One-point elevation in her ALT recently.  No recent imaging of her gallbladder.  She is gained 17 pounds since her last visit.  She continues to be actively engaged in CBT for bulimia.  She sees a Transport planner in [place].  She feels this is very productive.   She does take 1 NSAID or the other almost every day for various aches and pains.  She has nonrestorative sleep.  Patient tells me she snores all night long.  Does not feel like she ever gets good sleep.  Her neurologist has previously recommended a sleep study.  She does have a rather large hiatal hernia.  Past Medical History:  Diagnosis Date   Abdominal pain, epigastric 10/07/2018   Acute non-recurrent maxillary sinusitis 11/07/2019   Anxiety    Binge-eating and purging type anorexia nervosa    Bipolar affective disorder ([place]) 12/04/2015   Bipolar affective disorder, current episode manic without psychotic symptoms ([place]) 12/04/2015   Bipolar disorder ([place])    Cellulitis and abscess of buttock 02/15/2013   Cystitis, interstitial    Depression    Depression    Phreesia 01/28/2020   Esophageal polyp    about 20 years ago   GERD (gastroesophageal reflux disease)    Left wrist pain 03/22/2019   OCD (obsessive compulsive disorder)    Palpitations 05/25/2014    Rectal pain 12/06/2018   Rib pain on right side 01/31/2019   Rosacea 03/27/2019   Sprain of temporomandibular joint or ligament 01/10/2019    Past Surgical History:  Procedure Laterality Date   BLADDER SURGERY     CHOLECYSTECTOMY     COLONOSCOPY WITH PROPOFOL N/A 10/05/2019   Procedure: COLONOSCOPY WITH PROPOFOL;  Surgeon: Daneil Dolin, MD;  [facility]: [facility];  Service: Endoscopy;  Laterality: N/A;  12:45pm   COSMETIC SURGERY N/A    Phreesia 01/28/2020   ESOPHAGOGASTRODUODENOSCOPY  09/28/2016   [facility]; Dr. Therisa Doyne; erosions in the esophagus, 5 cm hiatal hernia, nonbleeding erosive gastropathy s/p biopsied, normal duodenum.  Path with chronic inactive gastritis, no H. pylori or intestinal metaplasia.    ESOPHAGOGASTRODUODENOSCOPY (EGD) WITH PROPOFOL N/A 10/17/2018   Dr. Gala Romney: mild reflux esophagitis, small hiatal hernia   POLYPECTOMY  10/05/2019   Procedure: POLYPECTOMY;  Surgeon: Daneil Dolin, MD;  Location: [facility];  Service: Endoscopy;;   VOCAL CORD LATERALIZATION, ENDOSCOPIC APPROACH W/ MLB      Prior to Admission medications   Medication Sig Start Date End Date Taking? Authorizing Provider  acetaminophen (TYLENOL) 500 MG tablet Take 1,000 mg by mouth daily as needed for fever.    Yes [provider]  Brexpiprazole ([name]) 4 MG TABS Take 4 mg by mouth in the morning. 01/22/20  Yes Hurst, Helene Kelp T, PA-C  clonazePAM (KLONOPIN) 0.5 MG tablet Take 1 tablet (  0.5 mg total) by mouth 2 (two) times daily as needed for anxiety. 05/21/20  Yes Hurst, Dorothea Glassman, PA-C  dicyclomine (BENTYL) 20 MG tablet Take 1 tablet (20 mg total) by mouth 3 (three) times daily as needed for spasms (abdominal cramping). 07/18/20  Yes Noreene Larsson, NP  Famotidine-Ca Carb-Mag Hydrox (PEPCID COMPLETE PO) Take 1 tablet by mouth daily.   Yes [provider]  fluvoxaMINE (LUVOX) 100 MG tablet Take 3 tablets (300 mg total) by mouth at bedtime. 05/21/20  Yes Hurst, Helene Kelp T, PA-C  gabapentin  (NEURONTIN) 600 MG tablet TAKE TWO TABLETS (1200MG  TOTAL) BY MOUTHTWO TIMES DAILY 09/10/20  Yes [name] T, PA-C  hydrOXYzine (ATARAX/VISTARIL) 25 MG tablet Take 1 tablet (25 mg total) by mouth 3 (three) times daily as needed. 05/21/20  Yes Hurst, Dorothea Glassman, PA-C  hyoscyamine (LEVSIN) 0.125 MG tablet Take 1 tablet (0.125 mg total) by mouth every 4 (four) hours as needed (diarrhea). 05/03/19  Yes Mahala Menghini, PA-C  ibuprofen (ADVIL) 600 MG tablet Take 1 tablet (600 mg total) by mouth every 8 (eight) hours as needed. 06/27/20  Yes Lamptey, Myrene Galas, MD  loratadine (CLARITIN) 10 MG tablet Take 5 mg by mouth daily as needed for allergies.    Yes [provider]  naproxen (NAPROSYN) 500 MG tablet Take 500 mg by mouth 2 (two) times daily as needed for migraine.   Yes [provider]  ondansetron (ZOFRAN-ODT) 4 MG disintegrating tablet TAKE ONE TABLET (4MG  TOTAL) BY MOUTH EVERY 8 HOURS AS NEEDED FOR NAUSEA OR VOMITING 10/17/19  Yes Lomax, Amy, NP  pramipexole (MIRAPEX) 0.5 MG tablet Take 1 tablet (0.5 mg total) by mouth at bedtime. 08/19/20  Yes Noreene Larsson, NP  predniSONE (STERAPRED UNI-PAK 21 TAB) 10 MG (21) TBPK tablet Take as package instructions. 10/11/20  Yes Lindell Spar, MD  Rimegepant Sulfate (NURTEC) 75 MG TBDP Take 75 mg by mouth daily as needed (take for abortive therapy of migraine, no more than 1 tablet in 24 hours or 10 per month). 03/28/20  Yes Lomax, Amy, NP  sodium chloride (OCEAN NASAL SPRAY) 0.65 % nasal spray Place 1 spray into the nose as needed for congestion. 02/15/20  Yes Lindell Spar, MD  traZODone (DESYREL) 100 MG tablet Take 1-2 tablets (100-200 mg total) by mouth at bedtime as needed for sleep. 01/22/20  Yes Hurst, Dorothea Glassman, PA-C  valACYclovir (VALTREX) 500 MG tablet Take 500 mg by mouth daily as needed (Flair up take Twice a day  for 3 days and stop).  01/03/18  Yes [provider]  fluticasone (FLONASE) 50 MCG/ACT nasal spray Place 1 spray into both  nostrils daily for 14 days. Patient taking differently: Place 1 spray into both nostrils as needed. 02/08/20 07/16/20  Emerson Monte, FNP    Allergies as of 10/18/2020 - Review Complete 10/18/2020  Allergen Reaction Noted   Codeine Shortness Of Breath, Nausea Only, and Rash 02/15/2013   Penicillins Hives, Shortness Of Breath, Swelling, and Rash 10/09/2011   Divalproex sodium Hives, Itching, and Rash 01/28/2020   Meloxicam Other (See Comments) 05/08/2014   Sonata [zaleplon] Other (See Comments) 10/22/2017   Topamax [topiramate] Other (See Comments) 10/04/2018   Aciphex [rabeprazole] Swelling 07/16/2020   Aimovig [erenumab-aooe] Itching 06/26/2019   Galcanezumab-gnlm Rash and Hives 10/04/2018    Family History  Problem Relation Age of Onset   Healthy Mother    Healthy Father    Colon cancer Neg Hx  Colon polyps Neg Hx     Social History   Socioeconomic History   Marital status: Married    Spouse name: Todd   Number of children: 0   Years of education: Not on file   Highest education level: Not on file  Occupational History   Not on file  Tobacco Use   Smoking status: Every Day    Packs/day: 1.00    Years: 20.00    Pack years: 20.00    Types: Cigarettes    Start date: 01/20/1988   Smokeless tobacco: Never  Vaping Use   Vaping Use: Never used  Substance and Sexual Activity   Alcohol use: No    Alcohol/week: 0.0 standard drinks   Drug use: No   Sexual activity: Yes    Birth control/protection: None  Other Topics Concern   Not on file  Social History Narrative   Lives with husband -[name] of 106 years       Yorkie- Max      Enjoys: reading-all genres      Diet: eats all food groups   Caffeine: 1 cup daily at times, diet dr pepper daily   Water: gatorade  zero and water       Wears seat belt    Does not use phone while driving   Smoke and Development worker, 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         Social Determinants of Adult nurse Strain: Low Risk    Difficulty of Paying Living Expenses: Not hard at all  Food Insecurity: No Food Insecurity   Worried About Charity fundraiser in the Last Year: Never true   Arboriculturist in the Last Year: Never true  Transportation Needs: No Transportation Needs   Lack of Transportation (Medical): No   Lack of Transportation (Non-Medical): No  Physical Activity: Insufficiently Active   Days of Exercise per Week: 7 days   Minutes of Exercise per Session: 20 min  Stress: Stress Concern Present   Feeling of Stress : To some extent  Social Connections: Moderately Integrated   Frequency of Communication with Friends and Family: More than three times a week   Frequency of Social Gatherings with Friends and Family: Once a week   Attends Religious Services: More than 4 times per year   Active Member of [employer] or Organizations: No   Attends Music therapist: Never   Marital Status: Married  Human resources officer Violence: Unknown   Fear of Current or Ex-Partner: No   Emotionally Abused: No   Physically Abused: No   Sexually Abused: Not on file    Review of Systems: See HPI, otherwise negative ROS  Physical Exam: BP 115/75   Pulse 86   Temp (!) 97.3 F (36.3 C)   Ht 5\' 4"  (1.626 m)   Wt 178 lb (80.7 kg)   LMP 10/07/2011   BMI 30.55 kg/m  General:   Alert,  Well-developed, well-nourished, pleasant and cooperative in NAD esions. Abdomen: Non-distended, normal bowel sounds.  Soft and nontender without appreciable mass or hepatosplenomegaly.  Pulses:  Normal pulses noted. Extremities:  Without clubbing or edema.  Impression/Plan: 55 year old lady with a history of GERD and bulimia nervosa suboptimally  controlled Recent episode of epigastric pain-etiology uncertain.  Minimal bump in LFTs noted previously.  Gallbladder long gone. Daily NSAIDs with no PPI. 17 pound weight gain since spring of last year undermining efforts to control  reflux. I  suspect she has poorly controlled GERD.  Some element of dyspepsia.  Large hiatal hernia.  I doubt she has a biliary etiology.  She has a very minuscule bump in her ALT which is totally nonspecific.  She looks good today. Would be a little premature to discuss antireflux surgery at this point in her course.  However, we may have that discussion in the future.    Recommendations:  GERD information provided  Continue Pepcid complete 40 mg twice daily  Try reflux [email or web address].  Take a dose immediately after eating as needed.  Look into getting a foam wedge for the bed at [facility]  Weight loss and exercise very important.  Hepatic function profile  Right upper quadrant ultrasound-epigastric pain elevated LFTs  Continue with cognitive behavioral therapy  Get that sleep study  Office visit with [place] in 3 months    Notice: This dictation was prepared with Dragon dictation along with smaller phrase technology. Any transcriptional errors that result from this process are unintentional and may not be corrected upon review.

## 2020-10-19 LAB — HEPATIC FUNCTION PANEL
AG Ratio: 1.7 (calc) (ref 1.0–2.5)
ALT: 16 U/L (ref 6–29)
AST: 20 U/L (ref 10–35)
Albumin: 4.5 g/dL (ref 3.6–5.1)
Alkaline phosphatase (APISO): 86 U/L (ref 37–153)
Bilirubin, Direct: 0.1 mg/dL (ref 0.0–0.2)
Globulin: 2.6 g/dL (calc) (ref 1.9–3.7)
Indirect Bilirubin: 0.2 mg/dL (calc) (ref 0.2–1.2)
Total Bilirubin: 0.3 mg/dL (ref 0.2–1.2)
Total Protein: 7.1 g/dL (ref 6.1–8.1)

## 2020-10-28 ENCOUNTER — Other Ambulatory Visit: Payer: Self-pay

## 2020-10-28 ENCOUNTER — Ambulatory Visit (HOSPITAL_COMMUNITY)
Admission: RE | Admit: 2020-10-28 | Discharge: 2020-10-28 | Disposition: A | Discharge status: Home / Self Care | Payer: Medicare PPO | Source: Ambulatory Visit | Attending: Internal Medicine | Admitting: Internal Medicine

## 2020-10-28 DIAGNOSIS — R7989 Other specified abnormal findings of blood chemistry: Secondary | ICD-10-CM

## 2020-10-28 DIAGNOSIS — R1011 Right upper quadrant pain: Secondary | ICD-10-CM | POA: Diagnosis not present

## 2020-10-28 DIAGNOSIS — R1013 Epigastric pain: Secondary | ICD-10-CM | POA: Diagnosis not present

## 2020-10-29 DIAGNOSIS — K760 Fatty (change of) liver, not elsewhere classified: Secondary | ICD-10-CM

## 2020-10-29 DIAGNOSIS — F502 Bulimia nervosa: Secondary | ICD-10-CM

## 2020-10-29 NOTE — Telephone Encounter (Signed)
Referral sent to nutritionist via Epic.

## 2020-10-29 NOTE — Telephone Encounter (Signed)
Letter mailed

## 2020-10-29 NOTE — Addendum Note (Signed)
Addended by: Hassan Rowan on: 10/29/2020 03:53 PM   Modules accepted: Orders

## 2020-10-30 ENCOUNTER — Other Ambulatory Visit: Payer: Self-pay

## 2020-10-30 ENCOUNTER — Ambulatory Visit: Payer: Medicare PPO | Admitting: Podiatry

## 2020-10-30 DIAGNOSIS — L600 Ingrowing nail: Secondary | ICD-10-CM | POA: Diagnosis not present

## 2020-11-04 ENCOUNTER — Encounter: Payer: Self-pay | Admitting: Podiatry

## 2020-11-04 NOTE — Progress Notes (Signed)
Subjective:  Patient ID: Jennifer [name], female    DOB: Mar 16, 1965,  MRN: [id number]  Chief Complaint  Patient presents with   Ingrown Toenail    Left hallux     55 y.o. female presents with the above complaint.  Patient presents with a new complaint of left medial border ingrown.  Patient states it is painful to touch.  She states the right side is doing well.  She would like to have the left side removed.  Has been on for 3 months has progressed to gotten worse and is causing her discomfort.  She would like to have removed.  She denies any other acute complaints.   Review of Systems: Negative except as noted in the HPI. Denies N/V/F/Ch.  Past Medical History:  Diagnosis Date   Abdominal pain, epigastric 10/07/2018   Acute non-recurrent maxillary sinusitis 11/07/2019   Anxiety    Binge-eating and purging type anorexia nervosa    Bipolar affective disorder ([place]) 12/04/2015   Bipolar affective disorder, current episode manic without psychotic symptoms ([place]) 12/04/2015   Bipolar disorder ([place])    Cellulitis and abscess of buttock 02/15/2013   Cystitis, interstitial    Depression    Depression    Phreesia 01/28/2020   Esophageal polyp    about 20 years ago   GERD (gastroesophageal reflux disease)    Left wrist pain 03/22/2019   OCD (obsessive compulsive disorder)    Palpitations 05/25/2014   Rectal pain 12/06/2018   Rib pain on right side 01/31/2019   Rosacea 03/27/2019   Sprain of temporomandibular joint or ligament 01/10/2019    Current Outpatient Medications:    acetaminophen (TYLENOL) 500 MG tablet, Take 1,000 mg by mouth daily as needed for fever. , Disp: , Rfl:    Brexpiprazole ([name]) 4 MG TABS, Take 4 mg by mouth in the morning., Disp: 30 tablet, Rfl: 11   clonazePAM (KLONOPIN) 0.5 MG tablet, Take 1 tablet (0.5 mg total) by mouth 2 (two) times daily as needed for anxiety., Disp: 20 tablet, Rfl: 5   dicyclomine (BENTYL) 20 MG tablet, Take 1 tablet (20 mg total) by mouth 3  (three) times daily as needed for spasms (abdominal cramping)., Disp: 20 tablet, Rfl: 0   Famotidine-Ca Carb-Mag Hydrox (PEPCID COMPLETE PO), Take 1 tablet by mouth daily., Disp: , Rfl:    fluticasone (FLONASE) 50 MCG/ACT nasal spray, Place 1 spray into both nostrils daily for 14 days. (Patient taking differently: Place 1 spray into both nostrils as needed.), Disp: 16 g, Rfl: 0   fluvoxaMINE (LUVOX) 100 MG tablet, Take 3 tablets (300 mg total) by mouth at bedtime., Disp: 270 tablet, Rfl: 3   gabapentin (NEURONTIN) 600 MG tablet, TAKE TWO TABLETS (1200MG  TOTAL) BY MOUTHTWO TIMES DAILY, Disp: 120 tablet, Rfl: 5   hydrOXYzine (ATARAX/VISTARIL) 25 MG tablet, Take 1 tablet (25 mg total) by mouth 3 (three) times daily as needed., Disp: 60 tablet, Rfl: 11   hyoscyamine (LEVSIN) 0.125 MG tablet, Take 1 tablet (0.125 mg total) by mouth every 4 (four) hours as needed (diarrhea)., Disp: 120 tablet, Rfl: 1   ibuprofen (ADVIL) 600 MG tablet, Take 1 tablet (600 mg total) by mouth every 8 (eight) hours as needed., Disp: 30 tablet, Rfl: 0   loratadine (CLARITIN) 10 MG tablet, Take 5 mg by mouth daily as needed for allergies. , Disp: , Rfl:    naproxen (NAPROSYN) 500 MG tablet, Take 500 mg by mouth 2 (two) times daily as needed for migraine., Disp: , Rfl:  ondansetron (ZOFRAN-ODT) 4 MG disintegrating tablet, TAKE ONE TABLET (4MG  TOTAL) BY MOUTH EVERY 8 HOURS AS NEEDED FOR NAUSEA OR VOMITING, Disp: 30 tablet, Rfl: 1   pramipexole (MIRAPEX) 0.5 MG tablet, Take 1 tablet (0.5 mg total) by mouth at bedtime., Disp: 90 tablet, Rfl: 1   predniSONE (STERAPRED UNI-PAK 21 TAB) 10 MG (21) TBPK tablet, Take as package instructions., Disp: 1 each, Rfl: 0   Rimegepant Sulfate (NURTEC) 75 MG TBDP, Take 75 mg by mouth daily as needed (take for abortive therapy of migraine, no more than 1 tablet in 24 hours or 10 per month)., Disp: 10 tablet, Rfl: 11   sodium chloride (OCEAN NASAL SPRAY) 0.65 % nasal spray, Place 1 spray into the nose  as needed for congestion., Disp: 30 mL, Rfl: 12   traZODone (DESYREL) 100 MG tablet, Take 1-2 tablets (100-200 mg total) by mouth at bedtime as needed for sleep., Disp: 60 tablet, Rfl: 11   valACYclovir (VALTREX) 500 MG tablet, Take 500 mg by mouth daily as needed (Flair up take Twice a day  for 3 days and stop). , Disp: , Rfl:   Social History   Tobacco Use  Smoking Status Every Day   Packs/day: 1.00   Years: 20.00   Pack years: 20.00   Types: Cigarettes   Start date: 01/20/1988  Smokeless Tobacco Never    Allergies  Allergen Reactions   Codeine Shortness Of Breath, Nausea Only and Rash   Penicillins Hives, Shortness Of Breath, Swelling and Rash    Has patient had a PCN reaction causing immediate rash, facial/tongue/throat swelling, SOB or lightheadedness with hypotension: yes Has patient had a PCN reaction causing severe rash involving mucus membranes or skin necrosis: no Has patient had a PCN reaction that required hospitalization: no Has patient had a PCN reaction occurring within the last 10 years: no If all of the above answers are "NO", then may proceed with Cephalosporin use.;      Divalproex Sodium Hives, Itching and Rash   Meloxicam Other (See Comments)    Possible chest tightness - instructed by MD not to take   Sonata [Zaleplon] Other (See Comments)    Hallucinations   Topamax [Topiramate] Other (See Comments)    Low BP and dizziness   Aciphex [Rabeprazole] Swelling   Aimovig [Erenumab-Aooe] Itching   Galcanezumab-Gnlm Rash and Hives   Objective:  There were no vitals filed for this visit. There is no height or weight on file to calculate BMI. Constitutional Well developed. Well nourished.  Vascular Dorsalis pedis pulses palpable bilaterally. Posterior tibial pulses palpable bilaterally. Capillary refill normal to all digits.  No cyanosis or clubbing noted. Pedal hair growth normal.  Neurologic Normal speech. Oriented to person, place, and time. Epicritic  sensation to light touch grossly present bilaterally.  Dermatologic Painful ingrowing nail at medial nail borders of the hallux nail left. No other open wounds. No skin lesions.  Orthopedic: Normal joint ROM without pain or crepitus bilaterally. No visible deformities. No bony tenderness.   Radiographs: None Assessment:   1. Ingrown left big toenail    Plan:  Patient was evaluated and treated and all questions answered.  Ingrown Nail, left -Patient elects to proceed with minor surgery to remove ingrown toenail removal today. Consent reviewed and signed by patient. -Ingrown nail excised. See procedure note. -Educated on post-procedure care including soaking. Written instructions provided and reviewed. -Patient to follow up in 2 weeks for nail check.  Procedure: Excision of Ingrown Toenail Location: Left 1st  toe medial nail borders. Anesthesia: Lidocaine 1% plain; 1.5 mL and Marcaine 0.5% plain; 1.5 mL, digital block. Skin Prep: Betadine. Dressing: Silvadene; telfa; dry, sterile, compression dressing. Technique: Following skin prep, the toe was exsanguinated and a tourniquet was secured at the base of the toe. The affected nail border was freed, split with a nail splitter, and excised. Chemical matrixectomy was then performed with phenol and irrigated out with alcohol. The tourniquet was then removed and sterile dressing applied. Disposition: Patient tolerated procedure well. Patient to return in 2 weeks for follow-up.   No follow-ups on file.

## 2020-11-06 DIAGNOSIS — F3131 Bipolar disorder, current episode depressed, mild: Secondary | ICD-10-CM | POA: Diagnosis not present

## 2020-11-06 DIAGNOSIS — F502 Bulimia nervosa: Secondary | ICD-10-CM | POA: Diagnosis not present

## 2020-11-06 DIAGNOSIS — F4310 Post-traumatic stress disorder, unspecified: Secondary | ICD-10-CM | POA: Diagnosis not present

## 2020-11-06 DIAGNOSIS — F429 Obsessive-compulsive disorder, unspecified: Secondary | ICD-10-CM | POA: Diagnosis not present

## 2020-11-13 DIAGNOSIS — F4310 Post-traumatic stress disorder, unspecified: Secondary | ICD-10-CM | POA: Diagnosis not present

## 2020-11-13 DIAGNOSIS — F3131 Bipolar disorder, current episode depressed, mild: Secondary | ICD-10-CM | POA: Diagnosis not present

## 2020-11-13 DIAGNOSIS — F429 Obsessive-compulsive disorder, unspecified: Secondary | ICD-10-CM | POA: Diagnosis not present

## 2020-11-13 DIAGNOSIS — F502 Bulimia nervosa: Secondary | ICD-10-CM | POA: Diagnosis not present

## 2020-11-15 ENCOUNTER — Other Ambulatory Visit: Payer: Self-pay | Admitting: Internal Medicine

## 2020-11-15 DIAGNOSIS — K760 Fatty (change of) liver, not elsewhere classified: Secondary | ICD-10-CM

## 2020-11-15 DIAGNOSIS — R7989 Other specified abnormal findings of blood chemistry: Secondary | ICD-10-CM

## 2020-11-16 DIAGNOSIS — H10013 Acute follicular conjunctivitis, bilateral: Secondary | ICD-10-CM | POA: Diagnosis not present

## 2020-11-19 ENCOUNTER — Encounter: Payer: Self-pay | Admitting: Internal Medicine

## 2020-11-19 ENCOUNTER — Ambulatory Visit: Payer: Medicare PPO | Admitting: Internal Medicine

## 2020-11-19 ENCOUNTER — Other Ambulatory Visit: Payer: Self-pay

## 2020-11-19 ENCOUNTER — Ambulatory Visit (HOSPITAL_COMMUNITY)
Admission: RE | Admit: 2020-11-19 | Discharge: 2020-11-19 | Disposition: A | Discharge status: Home / Self Care | Payer: Medicare PPO | Source: Ambulatory Visit | Attending: Internal Medicine | Admitting: Internal Medicine

## 2020-11-19 VITALS — BP 119/73 | HR 84 | Temp 98.8°F | Resp 18 | Ht 64.0 in | Wt 172.0 lb

## 2020-11-19 DIAGNOSIS — M25552 Pain in left hip: Secondary | ICD-10-CM | POA: Insufficient documentation

## 2020-11-19 DIAGNOSIS — R102 Pelvic and perineal pain: Secondary | ICD-10-CM | POA: Diagnosis not present

## 2020-11-19 MED ORDER — DICLOFENAC SODIUM 1 % EX GEL: 4.0000 g | Freq: Four times a day (QID) | CUTANEOUS | 0 refills | Status: DC

## 2020-11-19 NOTE — Patient Instructions (Signed)
Please take Naproxen and Tylenol alternatively for hip pain.  Please apply Voltaren gel for arthritic pain.  Avoid heavy lifting.

## 2020-11-19 NOTE — Progress Notes (Signed)
Acute Office Visit  Subjective:    Patient ID: Jennifer [name], female    DOB: 1965-03-28, 55 y.o.   MRN: [id number]  Chief Complaint  Patient presents with   Hip Pain    Left hip pain started 11-20-2018 was stiff now when she is exercising it is really hurting     HPI Patient is in today for left hip pain, which is chronic, intermittent, and worse with walking and exercising. She recently tried to increase her physical activity as instructed by her GI to lose weight for hepatic steatosis and had worsening of her left hip pain. She denies any recent injury or heavy lifting.  Denies any recent fall.  Past Medical History:  Diagnosis Date   Abdominal pain, epigastric 10/07/2018   Acute non-recurrent maxillary sinusitis 11/07/2019   Anxiety    Binge-eating and purging type anorexia nervosa    Bipolar affective disorder (HCC) 12/04/2015   Bipolar affective disorder, current episode manic without psychotic symptoms (HCC) 12/04/2015   Bipolar disorder (HCC)    Cellulitis and abscess of buttock 02/15/2013   Cystitis, interstitial    Depression    Depression    Phreesia 01/28/2020   Esophageal polyp    about 20 years ago   GERD (gastroesophageal reflux disease)    Left wrist pain 03/22/2019   OCD (obsessive compulsive disorder)    Palpitations 05/25/2014   Rectal pain 12/06/2018   Rib pain on right side 01/31/2019   Rosacea 03/27/2019   Sprain of temporomandibular joint or ligament 01/10/2019    Past Surgical History:  Procedure Laterality Date   BLADDER SURGERY     CHOLECYSTECTOMY     COLONOSCOPY WITH PROPOFOL N/A 10/05/2019   Procedure: COLONOSCOPY WITH PROPOFOL;  Surgeon: Corbin Ade, MD;  [facility]: [facility];  Service: Endoscopy;  Laterality: N/A;  12:45pm   COSMETIC SURGERY N/A    Phreesia 01/28/2020   ESOPHAGOGASTRODUODENOSCOPY  09/28/2016   [facility]; Dr. Marca Ancona; erosions in the esophagus, 5 cm hiatal hernia, nonbleeding erosive gastropathy s/p biopsied, normal duodenum.   Path with chronic inactive gastritis, no H. pylori or intestinal metaplasia.    ESOPHAGOGASTRODUODENOSCOPY (EGD) WITH PROPOFOL N/A 10/17/2018   Dr. Jena Gauss: mild reflux esophagitis, small hiatal hernia   POLYPECTOMY  10/05/2019   Procedure: POLYPECTOMY;  Surgeon: Corbin Ade, MD;  Location: [facility];  Service: Endoscopy;;   VOCAL CORD LATERALIZATION, ENDOSCOPIC APPROACH W/ MLB      Family History  Problem Relation Age of Onset   Healthy Mother    Healthy Father    Colon cancer Neg Hx    Colon polyps Neg Hx     Social History   Socioeconomic History   Marital status: Married    Spouse name: Todd   Number of children: 0   Years of education: Not on file   Highest education level: Not on file  Occupational History   Not on file  Tobacco Use   Smoking status: Every Day    Packs/day: 1.00    Years: 20.00    Pack years: 20.00    Types: Cigarettes    Start date: 01/20/1988   Smokeless tobacco: Never  Vaping Use   Vaping Use: Never used  Substance and Sexual Activity   Alcohol use: No    Alcohol/week: 0.0 standard drinks   Drug use: No   Sexual activity: Yes    Birth control/protection: None  Other Topics Concern   Not on file  Social History Narrative  Lives with husband -[name] of 41 years       Yorkie- Max      Enjoys: reading-all genres      Diet: eats all food groups   Caffeine: 1 cup daily at times, diet dr pepper daily   Water: gatorade  zero and water       Wears seat belt    Does not use phone while driving   Smoke and Development worker, 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         Social Determinants of Radio broadcast assistant Strain: Low Risk    Difficulty of Paying Living Expenses: Not hard at all  Food Insecurity: No Food Insecurity   Worried About Charity fundraiser in the Last Year: Never true   Arboriculturist in the Last Year: Never true  Transportation Needs: No Transportation Needs   Lack of Transportation (Medical):  No   Lack of Transportation (Non-Medical): No  Physical Activity: Insufficiently Active   Days of Exercise per Week: 7 days   Minutes of Exercise per Session: 20 min  Stress: Stress Concern Present   Feeling of Stress : To some extent  Social Connections: Moderately Integrated   Frequency of Communication with Friends and Family: More than three times a week   Frequency of Social Gatherings with Friends and Family: Once a week   Attends Religious Services: More than 4 times per year   Active Member of [employer] or Organizations: No   Attends Archivist Meetings: Never   Marital Status: Married  Human resources officer Violence: Unknown   Fear of Current or Ex-Partner: No   Emotionally Abused: No   Physically Abused: No   Sexually Abused: Not on file    Outpatient Medications Prior to Visit  Medication Sig Dispense Refill   Brexpiprazole ([name]) 4 MG TABS Take 4 mg by mouth in the morning. 30 tablet 11   clonazePAM (KLONOPIN) 0.5 MG tablet Take 1 tablet (0.5 mg total) by mouth 2 (two) times daily as needed for anxiety. 20 tablet 5   cromolyn (OPTICROM) 4 % ophthalmic solution 1 drop daily.     dicyclomine (BENTYL) 20 MG tablet Take 1 tablet (20 mg total) by mouth 3 (three) times daily as needed for spasms (abdominal cramping). 20 tablet 0   Famotidine-Ca Carb-Mag Hydrox (PEPCID COMPLETE PO) Take 1 tablet by mouth daily.     fluvoxaMINE (LUVOX) 100 MG tablet Take 3 tablets (300 mg total) by mouth at bedtime. 270 tablet 3   gabapentin (NEURONTIN) 600 MG tablet TAKE TWO TABLETS ($RemoveBefo'1200MG'xoVyKPECGdZ$  TOTAL) BY MOUTHTWO TIMES DAILY 120 tablet 5   hydrOXYzine (ATARAX/VISTARIL) 25 MG tablet Take 1 tablet (25 mg total) by mouth 3 (three) times daily as needed. 60 tablet 11   hyoscyamine (LEVSIN) 0.125 MG tablet Take 1 tablet (0.125 mg total) by mouth every 4 (four) hours as needed (diarrhea). 120 tablet 1   ibuprofen (ADVIL) 600 MG tablet Take 1 tablet (600 mg total) by mouth every 8 (eight) hours as  needed. 30 tablet 0   loratadine (CLARITIN) 10 MG tablet Take 5 mg by mouth daily as needed for allergies.      naproxen (NAPROSYN) 500 MG tablet Take 500 mg by mouth 2 (two) times daily as needed for migraine.     ondansetron (ZOFRAN-ODT) 4 MG disintegrating tablet TAKE ONE TABLET ($RemoveBef'4MG'FogKFNNvrS$  TOTAL) BY MOUTH EVERY 8 HOURS AS NEEDED FOR NAUSEA OR VOMITING 30  tablet 1   pramipexole (MIRAPEX) 0.5 MG tablet Take 1 tablet (0.5 mg total) by mouth at bedtime. 90 tablet 1   RESTASIS 0.05 % ophthalmic emulsion 1 drop 2 (two) times daily.     Rimegepant Sulfate (NURTEC) 75 MG TBDP Take 75 mg by mouth daily as needed (take for abortive therapy of migraine, no more than 1 tablet in 24 hours or 10 per month). 10 tablet 11   sodium chloride (OCEAN NASAL SPRAY) 0.65 % nasal spray Place 1 spray into the nose as needed for congestion. 30 mL 12   traZODone (DESYREL) 100 MG tablet Take 1-2 tablets (100-200 mg total) by mouth at bedtime as needed for sleep. 60 tablet 11   valACYclovir (VALTREX) 500 MG tablet Take 500 mg by mouth daily as needed (Flair up take Twice a day  for 3 days and stop).      acetaminophen (TYLENOL) 500 MG tablet Take 1,000 mg by mouth daily as needed for fever.      fluticasone (FLONASE) 50 MCG/ACT nasal spray Place 1 spray into both nostrils daily for 14 days. (Patient taking differently: Place 1 spray into both nostrils as needed.) 16 g 0   predniSONE (STERAPRED UNI-PAK 21 TAB) 10 MG (21) TBPK tablet Take as package instructions. 1 each 0   No facility-administered medications prior to visit.    Allergies  Allergen Reactions   Codeine Shortness Of Breath, Nausea Only and Rash   Penicillins Hives, Shortness Of Breath, Swelling and Rash    Has patient had a PCN reaction causing immediate rash, facial/tongue/throat swelling, SOB or lightheadedness with hypotension: yes Has patient had a PCN reaction causing severe rash involving mucus membranes or skin necrosis: no Has patient had a PCN reaction  that required hospitalization: no Has patient had a PCN reaction occurring within the last 10 years: no If all of the above answers are "NO", then may proceed with Cephalosporin use.;      Divalproex Sodium Hives, Itching and Rash   Meloxicam Other (See Comments)    Possible chest tightness - instructed by MD not to take   Sonata [Zaleplon] Other (See Comments)    Hallucinations   Topamax [Topiramate] Other (See Comments)    Low BP and dizziness   Aciphex [Rabeprazole] Swelling   Aimovig [Erenumab-Aooe] Itching   Galcanezumab-Gnlm Rash and Hives    Review of Systems  Constitutional:  Negative for chills and fever.  Respiratory:  Negative for cough and shortness of breath.   Cardiovascular:  Negative for chest pain and palpitations.  Gastrointestinal:  Negative for constipation, diarrhea, nausea and vomiting.  Genitourinary:  Negative for dysuria and hematuria.  Musculoskeletal:  Positive for arthralgias (Left hip). Negative for neck pain and neck stiffness.  Skin:  Negative for rash.      Objective:    Physical Exam Constitutional:      General: She is not in acute distress.    Appearance: She is not diaphoretic.  Eyes:     General: No scleral icterus.    Extraocular Movements: Extraocular movements intact.  Cardiovascular:     Rate and Rhythm: Normal rate and regular rhythm.     Pulses: Normal pulses.     Heart sounds: Normal heart sounds. No murmur heard. Pulmonary:     Breath sounds: Normal breath sounds. No wheezing or rales.  Musculoskeletal:        General: Tenderness (Left hip) present.     Comments: ROM at left hip mildly painful  Skin:  General: Skin is warm.     Findings: No rash.  Neurological:     General: No focal deficit present.     Mental Status: She is alert and oriented to person, place, and time. Mental status is at baseline.  Psychiatric:        Mood and Affect: Mood normal.        Behavior: Behavior normal.    BP 119/73 (BP Location:  Left Arm, Patient Position: Sitting, Cuff Size: Normal)   Pulse 84   Temp 98.8 F (37.1 C) (Oral)   Resp 18   Ht $R'5\' 4"'Yo$  (1.626 m)   Wt 172 lb (78 kg)   LMP 10/07/2011   SpO2 95%   BMI 29.52 kg/m  Wt Readings from Last 3 Encounters:  11/19/20 172 lb (78 kg)  10/18/20 178 lb (80.7 kg)  08/19/20 176 lb (79.8 kg)    Health Maintenance Due  Topic Date Due   HIV Screening  Never done   PAP SMEAR-Modifier  05/23/2016   Zoster Vaccines- Shingrix (2 of 2) 01/18/2019   MAMMOGRAM  06/21/2020   INFLUENZA VACCINE  08/19/2020   COVID-19 Vaccine (5 - Booster for Moderna series) 09/13/2020    There are no preventive care reminders to display for this patient.   Lab Results  Component Value Date   TSH 0.938 03/26/2020   Lab Results  Component Value Date   WBC 7.5 08/20/2020   HGB 14.7 08/20/2020   HCT 42.4 08/20/2020   MCV 90 08/20/2020   PLT 162 08/20/2020   Lab Results  Component Value Date   NA 140 08/20/2020   K 4.4 08/20/2020   CO2 24 08/20/2020   GLUCOSE 99 08/20/2020   BUN 15 08/20/2020   CREATININE 0.87 08/20/2020   BILITOT 0.3 10/18/2020   ALKPHOS 115 08/20/2020   AST 20 10/18/2020   ALT 16 10/18/2020   PROT 7.1 10/18/2020   ALBUMIN 4.8 08/20/2020   CALCIUM 9.3 08/20/2020   ANIONGAP 7 03/20/2019   EGFR 79 08/20/2020   Lab Results  Component Value Date   CHOL 158 03/26/2020   Lab Results  Component Value Date   HDL 55 03/26/2020   Lab Results  Component Value Date   LDLCALC 90 03/26/2020   Lab Results  Component Value Date   TRIG 65 03/26/2020   Lab Results  Component Value Date   CHOLHDL 2.9 03/26/2020   Lab Results  Component Value Date   HGBA1C 5.7 (H) 03/26/2020       Assessment & Plan:   Problem List Items Addressed This Visit       Other   Left hip pain - Primary    Worse with physical activity Could be OA of hip Check X-ray pelvis Naproxen and Tylenol alternatively Voltaren gel PRN       Relevant Medications    diclofenac Sodium (VOLTAREN) 1 % GEL   Other Relevant Orders   DG Pelvis 1-2 Views     Meds ordered this encounter  Medications   diclofenac Sodium (VOLTAREN) 1 % GEL    Sig: Apply 4 g topically 4 (four) times daily.    Dispense:  50 g    Refill:  0     Manases Etchison Keith Rake, MD

## 2020-11-19 NOTE — Assessment & Plan Note (Signed)
Worse with physical activity Could be OA of hip Check X-ray pelvis Naproxen and Tylenol alternatively Voltaren gel PRN

## 2020-11-20 ENCOUNTER — Ambulatory Visit: Payer: Medicare PPO | Admitting: Neurology

## 2020-11-20 ENCOUNTER — Telehealth: Payer: Self-pay | Admitting: Nurse Practitioner

## 2020-11-20 NOTE — Telephone Encounter (Signed)
Pt doesn't understand xray results , wants some to call and explain them.

## 2020-11-21 ENCOUNTER — Telehealth (INDEPENDENT_AMBULATORY_CARE_PROVIDER_SITE_OTHER): Payer: Medicare PPO | Admitting: Physician Assistant

## 2020-11-21 ENCOUNTER — Encounter: Payer: Self-pay | Admitting: Physician Assistant

## 2020-11-21 DIAGNOSIS — F319 Bipolar disorder, unspecified: Secondary | ICD-10-CM | POA: Diagnosis not present

## 2020-11-21 DIAGNOSIS — F509 Eating disorder, unspecified: Secondary | ICD-10-CM | POA: Diagnosis not present

## 2020-11-21 DIAGNOSIS — Z638 Other specified problems related to primary support group: Secondary | ICD-10-CM

## 2020-11-21 DIAGNOSIS — F17200 Nicotine dependence, unspecified, uncomplicated: Secondary | ICD-10-CM | POA: Diagnosis not present

## 2020-11-21 DIAGNOSIS — G43909 Migraine, unspecified, not intractable, without status migrainosus: Secondary | ICD-10-CM | POA: Diagnosis not present

## 2020-11-21 DIAGNOSIS — F411 Generalized anxiety disorder: Secondary | ICD-10-CM

## 2020-11-21 DIAGNOSIS — G4700 Insomnia, unspecified: Secondary | ICD-10-CM | POA: Diagnosis not present

## 2020-11-21 DIAGNOSIS — F429 Obsessive-compulsive disorder, unspecified: Secondary | ICD-10-CM | POA: Diagnosis not present

## 2020-11-21 MED ORDER — CLONAZEPAM 0.5 MG PO TABS: 0.5000 mg | ORAL_TABLET | Freq: Two times a day (BID) | ORAL | 5 refills | Status: DC | PRN

## 2020-11-21 NOTE — Progress Notes (Signed)
[facility] Check  Patient ID: Jennifer [name],  MRN: [id number]  PCP: Noreene Larsson, NP  Date of Evaluation: 11/21/2020 Time spent:40 minutes  Chief Complaint:  Chief Complaint   Anxiety; Depression; Insomnia; Follow-up    Virtual Visit via Telehealth  I connected with patient by a video enabled telemedicine application with their informed consent, and verified patient privacy and that I am speaking with the correct person using two identifiers.  I am private, in my office and the patient is at home.  I discussed the limitations, risks, security and privacy concerns of performing an evaluation and management service by video and the availability of in person appointments. I also discussed with the patient that there may be a patient responsible charge related to this service. The patient expressed understanding and agreed to proceed.   I discussed the assessment and treatment plan with the patient. The patient was provided an opportunity to ask questions and all were answered. The patient agreed with the plan and demonstrated an understanding of the instructions.   The patient was advised to call back or seek an in-person evaluation if the symptoms worsen or if the condition fails to improve as anticipated.  I provided 40 minutes of non-face-to-face time during this encounter.   HISTORY/CURRENT STATUS: HPI For routine med check.  Had SI on 10/23, felt really sad, but got better in a few hours.  Had missed her meds 1 day the previous week. Her husband was at the grocery store and she just didn't want to be there when he got back. But she started using some of the tools she's learning in DBT, she realized she's got reasons to live so did not want to go through with it.  She did have a plan but she will not tell anyone what it was.  "I am not going to do anything, I promise."  She has discussed this with her therapist as well.  Has had no further suicidal thoughts since that  day.  Patient denies loss of interest in usual activities and is able to enjoy things.  Denies decreased energy or motivation.  Appetite has not changed.  No extreme sadness, tearfulness, or feelings of hopelessness.  Denies any changes in concentration, making decisions or remembering things.  Has trouble sleeping off and on.  The trazodone helps sometimes and other times not so much.  No homicidal thoughts.  Occasionally has anxiety.  She has been a little bit anxious because she is found out that she has fatty liver.  But the most recent labs show normal liver function.  Her doctor wants her to walk an hour every day to help her lose weight.  She had quit purging for a while but now is purging some again.  She knows it is not good for her and she talks with her therapist about this as well.  She does take the Klonopin but extremely rarely.  Has not had it filled since March.  She is also having problems with hip pain.  X-rays have been done but she has not been notified of results although she has already pulled them up on MyChart.  She states it seems to indicate arthritis.  Patient denies increased energy with decreased need for sleep, no increased talkativeness, no racing thoughts, no impulsivity or risky behaviors, no increased spending, no increased libido, no grandiosity, no increased irritability or anger, and no hallucinations.  She continues to smoke.  Wants to quit at some point but not  sure what to do about it.  She does tend to have trouble around the holidays and wintertime so is not sure if this is a good time to try and quit or not  Denies dizziness, syncope, seizures, numbness, tingling, tremor, tics, unsteady gait, slurred speech, confusion.  She does have joint pain as noted above.  Individual Medical History/ Review of Systems: Changes? :Yes      see HPI  Past medications for mental health diagnoses include: Risperdal, Seroquel, Prozac, Zoloft,  Wellbutrin, Lamictal, Depakote  caused hair loss, Xanax, Ambien, trazodone, Trileptal, Luvox, Topamax, Elavil, Pamelor, BuSpar, doxazosin, prazosin, lithium, Vraylar, Abilify, [name], Latuda >60 mg caused abd pain and nausea  Allergies: Codeine, Penicillins, Divalproex sodium, Meloxicam, Sonata [zaleplon], Topamax [topiramate], Aciphex [rabeprazole], Aimovig [erenumab-aooe], and Galcanezumab-gnlm  Current Medications:  Current Outpatient Medications:    Brexpiprazole ([name]) 4 MG TABS, Take 4 mg by mouth in the morning., Disp: 30 tablet, Rfl: 11   cromolyn (OPTICROM) 4 % ophthalmic solution, 1 drop daily., Disp: , Rfl:    diclofenac Sodium (VOLTAREN) 1 % GEL, Apply 4 g topically 4 (four) times daily., Disp: 50 g, Rfl: 0   dicyclomine (BENTYL) 20 MG tablet, Take 1 tablet (20 mg total) by mouth 3 (three) times daily as needed for spasms (abdominal cramping)., Disp: 20 tablet, Rfl: 0   Famotidine-Ca Carb-Mag Hydrox (PEPCID COMPLETE PO), Take 1 tablet by mouth daily., Disp: , Rfl:    fluvoxaMINE (LUVOX) 100 MG tablet, Take 3 tablets (300 mg total) by mouth at bedtime., Disp: 270 tablet, Rfl: 3   gabapentin (NEURONTIN) 600 MG tablet, TAKE TWO TABLETS (1200MG  TOTAL) BY MOUTHTWO TIMES DAILY, Disp: 120 tablet, Rfl: 5   hydrOXYzine (ATARAX/VISTARIL) 25 MG tablet, Take 1 tablet (25 mg total) by mouth 3 (three) times daily as needed., Disp: 60 tablet, Rfl: 11   hyoscyamine (LEVSIN) 0.125 MG tablet, Take 1 tablet (0.125 mg total) by mouth every 4 (four) hours as needed (diarrhea)., Disp: 120 tablet, Rfl: 1   ibuprofen (ADVIL) 600 MG tablet, Take 1 tablet (600 mg total) by mouth every 8 (eight) hours as needed., Disp: 30 tablet, Rfl: 0   loratadine (CLARITIN) 10 MG tablet, Take 5 mg by mouth daily as needed for allergies. , Disp: , Rfl:    naproxen (NAPROSYN) 500 MG tablet, Take 500 mg by mouth 2 (two) times daily as needed for migraine., Disp: , Rfl:    ondansetron (ZOFRAN-ODT) 4 MG disintegrating tablet, TAKE ONE TABLET (4MG  TOTAL) BY  MOUTH EVERY 8 HOURS AS NEEDED FOR NAUSEA OR VOMITING, Disp: 30 tablet, Rfl: 1   pramipexole (MIRAPEX) 0.5 MG tablet, Take 1 tablet (0.5 mg total) by mouth at bedtime., Disp: 90 tablet, Rfl: 1   prednisoLONE acetate (PRED FORTE) 1 % ophthalmic suspension, 1 drop 4 (four) times daily., Disp: , Rfl:    RESTASIS 0.05 % ophthalmic emulsion, 1 drop 2 (two) times daily., Disp: , Rfl:    Rimegepant Sulfate (NURTEC) 75 MG TBDP, Take 75 mg by mouth daily as needed (take for abortive therapy of migraine, no more than 1 tablet in 24 hours or 10 per month)., Disp: 10 tablet, Rfl: 11   sodium chloride (OCEAN NASAL SPRAY) 0.65 % nasal spray, Place 1 spray into the nose as needed for congestion., Disp: 30 mL, Rfl: 12   traZODone (DESYREL) 100 MG tablet, Take 1-2 tablets (100-200 mg total) by mouth at bedtime as needed for sleep., Disp: 60 tablet, Rfl: 11   valACYclovir (VALTREX) 500 MG tablet,  Take 500 mg by mouth daily as needed (Flair up take Twice a day  for 3 days and stop). , Disp: , Rfl:    clonazePAM (KLONOPIN) 0.5 MG tablet, Take 1 tablet (0.5 mg total) by mouth 2 (two) times daily as needed for anxiety., Disp: 20 tablet, Rfl: 5 Medication Side Effects: none  Family Medical/ Social History: Changes?  Holidays stress about which family to go to when.  MENTAL HEALTH EXAM:  Last menstrual period 10/07/2011.There is no height or weight on file to calculate BMI.  General Appearance: Casual and Well Groomed  Eye Contact:  Good  Speech:  Clear and Coherent and Normal Rate  Volume:  Normal  Mood:  Euthymic  Affect:  Appropriate  Thought Process:  Goal Directed and Descriptions of Associations: Circumstantial  Orientation:  Full (Time, Place, and Person)  Thought Content: Logical   Suicidal Thoughts:  No  Homicidal Thoughts:  No  Memory:  WNL  Judgement:  Good  Insight:  Good  Psychomotor Activity:  Normal  Concentration:  Concentration: Good and Attention Span: Good  Recall:  Good  Fund of  Knowledge: Good  Language: Good  Assets:  Desire for Improvement  ADL's:  Intact  Cognition: WNL  Prognosis:  Good   10/18/2020 CMP was completely normal.  DIAGNOSES:    ICD-10-CM   1. Obsessive-compulsive disorder, unspecified type  F42.9     2. Bipolar I disorder ([place])  F31.9     3. Insomnia, unspecified type  G47.00     4. Generalized anxiety disorder  F41.1     5. Eating disorder, unspecified type  F50.9     6. Stress due to family tension  Z63.8     7. Migraine without status migrainosus, not intractable, unspecified migraine type  G43.909     8. Smoker  F17.200         Receiving Psychotherapy: [facility]   RECOMMENDATIONS:  PDMP was reviewed.  Last Klonopin filled 04/17/2020. I provided 40 minutes of non-face-to-face time during this encounter, including time spent before and after the visit in records review, medical decision making, and charting.  Discussed the fatty liver.  I tried to reassure her now that the LFTs are normal that is a good sign, she should definitely follow the advice of her providers who are treating her for that. We discussed her suicidal thoughts that she had recently.  Contract for safety is in place.  She knows to call our office, [phone], go to [facility], or the nearest ER if she feels suicidal ever again.  She verbalizes understanding. Briefly discussed smoking cessation.  She is not really ready to quit at this point because of other lifestyle changes such as increased exercise she has to do because of the fatty liver and the fact she is having a lot of hip pain and probably early arthritis there.  We can discuss further at another time.  She can always decrease the cigarettes she smokes daily for a week and wean off that way.  An example would be smoking 19 cigarettes daily for a week, then 18 cigarettes daily for a week and so on. Discussed insomnia.  No caffeine after lunch, blue blocker glasses or do  not stay on her electronics within 2 hours before bed. No medication changes are necessary. Continue [name] 4 mg, 1 p.o. daily. Continue Klonopin 0.5 mg, 1 p.o. twice daily as needed. Continue Luvox 100 mg, 3 p.o. nightly. Continue gabapentin  600 mg, 2 p.o. twice daily. Continue hydroxyzine 25 mg, 1 p.o. 3 times daily as needed. Continue pramipexole 0.5 mg nightly.  Per another provider Continue trazodone 100 mg, 1-2 nightly as needed sleep. Continue therapy. Return in 6 weeks.  [name], PA-C

## 2020-11-21 NOTE — Telephone Encounter (Signed)
Pt advised with verbal understanding will speak with dr Colon Branch

## 2020-11-22 ENCOUNTER — Other Ambulatory Visit: Payer: Self-pay

## 2020-11-22 ENCOUNTER — Ambulatory Visit: Payer: Medicare PPO | Admitting: Orthopedic Surgery

## 2020-11-22 ENCOUNTER — Encounter: Payer: Self-pay | Admitting: Orthopedic Surgery

## 2020-11-22 VITALS — BP 127/83 | HR 75 | Ht 64.0 in | Wt 175.0 lb

## 2020-11-22 DIAGNOSIS — S76012A Strain of muscle, fascia and tendon of left hip, initial encounter: Secondary | ICD-10-CM

## 2020-11-22 NOTE — Progress Notes (Signed)
[facility] Return  Assessment: Jennifer [name] is a 55 y.o. female with the following: Anterior left hip pain, most consistent with muscular strain  Plan: Reviewed radiographs of bilateral hips, which demonstrates minimal degenerative changes.  Pain is in the anterior left hip, which is most likely a muscular strain associated with a relatively quick increase in the level of her activity.  Low possibility of an intra-articular injury such as a labral tear, based on the acuity of the pain.  Recommended continued use of NSAIDs.  Provided some basic at home exercises.  Advised her to remain active.  All questions were answered.  No follow-up needed.  Follow-up: Return if symptoms worsen or fail to improve.   Subjective:  Chief Complaint  Patient presents with   Hip Pain    Left hip/ has been walking more per Dr. Gala Romney and hip is really starting to bother her    History of Present Illness: Jennifer [name] is a 55 y.o. female who returns to clinic for evaluation of left anterior hip pain.  Patient reports pain in the anterior left hip for the past few weeks.  She reports that she was encouraged by her PCP to increase the level of activities.  It was recommended that she gradually ramp up the length of her walking, but she immediately started walking up to an hour a day.  As a result, she has noticed some anterior hip pain.  She has been taking some NSAIDs.  She has had an x-ray, and was told she had some arthritis.  Review of Systems: No fevers or chills No numbness or tingling No chest pain No shortness of breath No bowel or bladder dysfunction No GI distress No headaches   Objective: BP 127/83   Pulse 75   Ht 5\' 4"  (1.626 m)   Wt 175 lb (79.4 kg)   LMP 10/07/2011   BMI 30.04 kg/m   Physical Exam:  Alert and oriented.  No acute distress.  Normal gait.  Able to maintain a straight leg raise.  Some pain with resisted straight leg raise.  Pain in the hip impingement  position.  Some tenderness to palpation over the anterior hip.  Tenderness palpation directly over the ASIS.  Resisted hip flexion recreates her pain.  IMAGING: I personally ordered and reviewed the following images:  AP of the pelvis was previously obtained in clinic, and demonstrates mild degenerative changes in bilateral hips.  No acute injuries.  Mordecai Rasmussen, MD 11/22/2020 10:26 PM

## 2020-11-22 NOTE — Patient Instructions (Signed)
Let us know if the home exercises are not helping.  We can prescribe formal PT

## 2020-11-25 ENCOUNTER — Encounter (HOSPITAL_COMMUNITY): Payer: Self-pay

## 2020-11-25 ENCOUNTER — Emergency Department (HOSPITAL_COMMUNITY)
Admission: EM | Admit: 2020-11-25 | Discharge: 2020-11-25 | Disposition: A | Discharge status: Home / Self Care | Payer: Medicare PPO | Attending: Emergency Medicine | Admitting: Emergency Medicine

## 2020-11-25 ENCOUNTER — Emergency Department (HOSPITAL_COMMUNITY): Payer: Medicare PPO

## 2020-11-25 ENCOUNTER — Other Ambulatory Visit: Payer: Self-pay

## 2020-11-25 DIAGNOSIS — R609 Edema, unspecified: Secondary | ICD-10-CM | POA: Insufficient documentation

## 2020-11-25 DIAGNOSIS — M7989 Other specified soft tissue disorders: Secondary | ICD-10-CM | POA: Diagnosis not present

## 2020-11-25 DIAGNOSIS — F17210 Nicotine dependence, cigarettes, uncomplicated: Secondary | ICD-10-CM | POA: Insufficient documentation

## 2020-11-25 HISTORY — DX: Unspecified osteoarthritis, unspecified site: M19.90

## 2020-11-25 LAB — COMPREHENSIVE METABOLIC PANEL
ALT: 19 U/L (ref 0–44)
AST: 21 U/L (ref 15–41)
Albumin: 4.1 g/dL (ref 3.5–5.0)
Alkaline Phosphatase: 83 U/L (ref 38–126)
Anion gap: 6 (ref 5–15)
BUN: 17 mg/dL (ref 6–20)
CO2: 27 mmol/L (ref 22–32)
Calcium: 8.9 mg/dL (ref 8.9–10.3)
Chloride: 107 mmol/L (ref 98–111)
Creatinine, Ser: 0.74 mg/dL (ref 0.44–1.00)
GFR, Estimated: >60 mL/min (ref >60)
Glucose, Bld: 110 mg/dL — ABNORMAL HIGH (ref 70–99)
Potassium: 3.6 mmol/L (ref 3.5–5.1)
Sodium: 140 mmol/L (ref 135–145)
Total Bilirubin: 0.3 mg/dL (ref 0.3–1.2)
Total Protein: 6.6 g/dL (ref 6.5–8.1)

## 2020-11-25 LAB — CBC WITH DIFFERENTIAL/PLATELET
Abs Immature Granulocytes: 0.02 K/uL (ref 0.00–0.07)
Basophils Absolute: 0 K/uL (ref 0.0–0.1)
Basophils Relative: 1 %
Eosinophils Absolute: 0.2 K/uL (ref 0.0–0.5)
Eosinophils Relative: 3 %
HCT: 39.7 % (ref 36.0–46.0)
Hemoglobin: 13.1 g/dL (ref 12.0–15.0)
Immature Granulocytes: 0 %
Lymphocytes Relative: 38 %
Lymphs Abs: 2.8 K/uL (ref 0.7–4.0)
MCH: 30.8 pg (ref 26.0–34.0)
MCHC: 33 g/dL (ref 30.0–36.0)
MCV: 93.4 fL (ref 80.0–100.0)
Monocytes Absolute: 0.7 K/uL (ref 0.1–1.0)
Monocytes Relative: 10 %
Neutro Abs: 3.7 K/uL (ref 1.7–7.7)
Neutrophils Relative %: 48 %
Platelets: 134 K/uL — ABNORMAL LOW (ref 150–400)
RBC: 4.25 MIL/uL (ref 3.87–5.11)
RDW: 14.1 % (ref 11.5–15.5)
WBC: 7.5 K/uL (ref 4.0–10.5)
nRBC: 0 % (ref 0.0–0.2)

## 2020-11-25 LAB — URINALYSIS, ROUTINE W REFLEX MICROSCOPIC
Bilirubin Urine: NEGATIVE
Glucose, UA: NEGATIVE mg/dL
Hgb urine dipstick: NEGATIVE
Ketones, ur: NEGATIVE mg/dL
Leukocytes,Ua: NEGATIVE
Nitrite: NEGATIVE
Protein, ur: NEGATIVE mg/dL
Specific Gravity, Urine: 1.004 — ABNORMAL LOW (ref 1.005–1.030)
pH: 6 (ref 5.0–8.0)

## 2020-11-25 NOTE — ED Triage Notes (Signed)
Pt c/o of bilateral leg swelling that started last night.

## 2020-11-25 NOTE — Discharge Instructions (Signed)
Take Aleve or Motrin as needed for discomfort.  Just rest your foot for now and follow-up if not improved

## 2020-11-25 NOTE — ED Provider Notes (Signed)
[facility] Provider Note  CSN: [id number] Arrival date & time: 11/25/20 0503    History Chief Complaint  Patient presents with  . Leg Swelling    Jennifer [name] is a 55 y.o. female with history of anxiety/BPD reports she has not been sleeping well recently. She was up during the night and noticed some swelling in her R leg, worse than the L. No recent falls or injury. No CP or SOB. She has been urinating frequently but no fever, nausea or vomiting.    Past Medical History:  Diagnosis Date  . Abdominal pain, epigastric 10/07/2018  . Acute non-recurrent maxillary sinusitis 11/07/2019  . Anxiety   . Arthritis   . Binge-eating and purging type anorexia nervosa   . Bipolar affective disorder ([place]) 12/04/2015  . Bipolar affective disorder, current episode manic without psychotic symptoms ([place]) 12/04/2015  . Bipolar disorder ([place])   . Cellulitis and abscess of buttock 02/15/2013  . Cystitis, interstitial   . Depression   . Depression    Phreesia 01/28/2020  . Esophageal polyp    about 20 years ago  . GERD (gastroesophageal reflux disease)   . Left wrist pain 03/22/2019  . OCD (obsessive compulsive disorder)   . Palpitations 05/25/2014  . Rectal pain 12/06/2018  . Rib pain on right side 01/31/2019  . Rosacea 03/27/2019  . Sprain of temporomandibular joint or ligament 01/10/2019    Past Surgical History:  Procedure Laterality Date  . BLADDER SURGERY    . CHOLECYSTECTOMY    . COLONOSCOPY WITH PROPOFOL N/A 10/05/2019   Procedure: COLONOSCOPY WITH PROPOFOL;  Surgeon: Daneil Dolin, MD;  [facility];  Service: Endoscopy;  Laterality: N/A;  12:45pm  . COSMETIC SURGERY N/A    Phreesia 01/28/2020  . ESOPHAGOGASTRODUODENOSCOPY  09/28/2016   [facility]; Dr. Therisa Doyne; erosions in the esophagus, 5 cm hiatal hernia, nonbleeding erosive gastropathy s/p biopsied, normal duodenum.  Path with chronic inactive gastritis, no H. pylori or intestinal metaplasia.    [facility] ESOPHAGOGASTRODUODENOSCOPY (EGD) WITH PROPOFOL N/A 10/17/2018   Dr. Gala Romney: mild reflux esophagitis, small hiatal hernia  . POLYPECTOMY  10/05/2019   Procedure: POLYPECTOMY;  Surgeon: Daneil Dolin, MD;  Location: [facility];  Service: Endoscopy;;  . VOCAL CORD LATERALIZATION, ENDOSCOPIC APPROACH W/ MLB      Family History  Problem Relation Age of Onset  . Healthy Mother   . Healthy Father   . Colon cancer Neg Hx   . Colon polyps Neg Hx     Social History   Tobacco Use  . Smoking status: Every Day    Packs/day: 1.00    Years: 20.00    Pack years: 20.00    Types: Cigarettes    Start date: 01/20/1988  . Smokeless tobacco: Never  Vaping Use  . Vaping Use: Never used  Substance Use Topics  . Alcohol use: No    Alcohol/week: 0.0 standard drinks  . Drug use: No     Home Medications Prior to Admission medications   Medication Sig Start Date End Date Taking? Authorizing Provider  Brexpiprazole ([name]) 4 MG TABS Take 4 mg by mouth in the morning. 01/22/20   Addison Lank, PA-C  clonazePAM (KLONOPIN) 0.5 MG tablet Take 1 tablet (0.5 mg total) by mouth 2 (two) times daily as needed for anxiety. 11/21/20   [name] T, PA-C  cromolyn (OPTICROM) 4 % ophthalmic solution 1 drop daily. 11/16/20   [provider]  diclofenac Sodium (VOLTAREN)  1 % GEL Apply 4 g topically 4 (four) times daily. 11/19/20   Lindell Spar, MD  dicyclomine (BENTYL) 20 MG tablet Take 1 tablet (20 mg total) by mouth 3 (three) times daily as needed for spasms (abdominal cramping). 07/18/20   Noreene Larsson, NP  Famotidine-Ca Carb-Mag Hydrox (PEPCID COMPLETE PO) Take 1 tablet by mouth daily.    [provider]  fluvoxaMINE (LUVOX) 100 MG tablet Take 3 tablets (300 mg total) by mouth at bedtime. 05/21/20   [name] T, PA-C  gabapentin (NEURONTIN) 600 MG tablet TAKE TWO TABLETS (1200MG  TOTAL) BY MOUTHTWO TIMES DAILY 09/10/20   [name] T, PA-C  hydrOXYzine (ATARAX/VISTARIL) 25 MG  tablet Take 1 tablet (25 mg total) by mouth 3 (three) times daily as needed. 05/21/20   Addison Lank, PA-C  hyoscyamine (LEVSIN) 0.125 MG tablet Take 1 tablet (0.125 mg total) by mouth every 4 (four) hours as needed (diarrhea). 05/03/19   Mahala Menghini, PA-C  ibuprofen (ADVIL) 600 MG tablet Take 1 tablet (600 mg total) by mouth every 8 (eight) hours as needed. 06/27/20   Chase Picket, MD  loratadine (CLARITIN) 10 MG tablet Take 5 mg by mouth daily as needed for allergies.     [provider]  naproxen (NAPROSYN) 500 MG tablet Take 500 mg by mouth 2 (two) times daily as needed for migraine.    [provider]  ondansetron (ZOFRAN-ODT) 4 MG disintegrating tablet TAKE ONE TABLET (4MG  TOTAL) BY MOUTH EVERY 8 HOURS AS NEEDED FOR NAUSEA OR VOMITING 10/17/19   Lomax, Amy, NP  pramipexole (MIRAPEX) 0.5 MG tablet Take 1 tablet (0.5 mg total) by mouth at bedtime. 08/19/20   Noreene Larsson, NP  prednisoLONE acetate (PRED FORTE) 1 % ophthalmic suspension 1 drop 4 (four) times daily.    [provider]  RESTASIS 0.05 % ophthalmic emulsion 1 drop 2 (two) times daily. 11/11/20   [provider]  Rimegepant Sulfate (NURTEC) 75 MG TBDP Take 75 mg by mouth daily as needed (take for abortive therapy of migraine, no more than 1 tablet in 24 hours or 10 per month). 03/28/20   Lomax, Amy, NP  sodium chloride (OCEAN NASAL SPRAY) 0.65 % nasal spray Place 1 spray into the nose as needed for congestion. 02/15/20   Lindell Spar, MD  traZODone (DESYREL) 100 MG tablet Take 1-2 tablets (100-200 mg total) by mouth at bedtime as needed for sleep. 01/22/20   Addison Lank, PA-C  valACYclovir (VALTREX) 500 MG tablet Take 500 mg by mouth daily as needed (Flair up take Twice a day  for 3 days and stop).  01/03/18   [provider]     Allergies    Codeine, Penicillins, Divalproex sodium, Meloxicam, Sonata [zaleplon], Topamax [topiramate], Aciphex [rabeprazole], Aimovig [erenumab-aooe],  and Galcanezumab-gnlm   Review of Systems   Review of Systems A comprehensive review of systems was completed and negative except as noted in HPI.    Physical Exam BP 111/66   Pulse 73   Temp 97.9 F (36.6 C) (Oral)   Resp 19   Ht 5\' 4"  (1.626 m)   Wt 79.4 kg   LMP 10/07/2011   SpO2 99%   BMI 30.04 kg/m   Physical Exam Vitals and nursing note reviewed.  Constitutional:      Appearance: Normal appearance.  HENT:     Head: Normocephalic and atraumatic.     Nose: Nose normal.     Mouth/Throat:  Mouth: Mucous membranes are moist.  Eyes:     Extraocular Movements: Extraocular movements intact.     Conjunctiva/sclera: Conjunctivae normal.  Cardiovascular:     Rate and Rhythm: Normal rate.  Pulmonary:     Effort: Pulmonary effort is normal.     Breath sounds: Normal breath sounds.  Abdominal:     General: Abdomen is flat.     Palpations: Abdomen is soft.     Tenderness: There is no abdominal tenderness.  Musculoskeletal:        General: No swelling. Normal range of motion.     Cervical back: Neck supple.     Right lower leg: Edema present.     Left lower leg: No edema.  Skin:    General: Skin is warm and dry.  Neurological:     General: No focal deficit present.     Mental Status: She is alert.  Psychiatric:        Mood and Affect: Mood normal.     ED Results / Procedures / Treatments   Labs (all labs ordered are listed, but only abnormal results are displayed) Labs Reviewed  COMPREHENSIVE METABOLIC PANEL  CBC WITH DIFFERENTIAL/PLATELET  URINALYSIS, ROUTINE W REFLEX MICROSCOPIC    EKG None  Radiology No results found.  Procedures Procedures  Medications Ordered in the ED Medications - No data to display   MDM Rules/Calculators/A&P MDM  Patient with asymmetric LE edema. No signs of PE. Will check labs and [place] when they are available later this morning.   ED Course  I have reviewed the triage vital signs and the nursing notes.  Pertinent  labs & imaging results that were available during my care of the patient were reviewed by me and considered in my medical decision making (see chart for details).  Clinical Course as of 11/25/20 0719  Mon Nov 25, 2020  7017 CBC is normal.  [CS]  [id number] CMP is normal. Care of the patient signed out to Dr. Roderic Palau at the change of shift pending Doppler [CS]    Clinical Course User Index [CS] Truddie Hidden, MD    Final Clinical Impression(s) / ED Diagnoses Final diagnoses:  None    Rx / DC Orders ED Discharge Orders     None        Truddie Hidden, MD 11/25/20 [phone]

## 2020-11-27 DIAGNOSIS — F3131 Bipolar disorder, current episode depressed, mild: Secondary | ICD-10-CM | POA: Diagnosis not present

## 2020-11-27 DIAGNOSIS — F429 Obsessive-compulsive disorder, unspecified: Secondary | ICD-10-CM | POA: Diagnosis not present

## 2020-11-27 DIAGNOSIS — F4310 Post-traumatic stress disorder, unspecified: Secondary | ICD-10-CM | POA: Diagnosis not present

## 2020-11-27 DIAGNOSIS — F502 Bulimia nervosa: Secondary | ICD-10-CM | POA: Diagnosis not present

## 2020-11-29 ENCOUNTER — Encounter: Payer: Self-pay | Admitting: Internal Medicine

## 2020-11-29 ENCOUNTER — Other Ambulatory Visit: Payer: Self-pay

## 2020-11-29 ENCOUNTER — Ambulatory Visit: Payer: Medicare PPO | Admitting: Internal Medicine

## 2020-11-29 DIAGNOSIS — M7989 Other specified soft tissue disorders: Secondary | ICD-10-CM | POA: Diagnosis not present

## 2020-11-29 DIAGNOSIS — D696 Thrombocytopenia, unspecified: Secondary | ICD-10-CM

## 2020-11-29 NOTE — Assessment & Plan Note (Signed)
Recent ER visit-ER chart reviewed including US Doppler of right LE Advised to follow low-salt diet Leg elevation as tolerated Compression socks

## 2020-11-29 NOTE — Assessment & Plan Note (Signed)
Borderline low platelets and last CBC We will check CBC after 3 months Complains of easy bruising, which is chronic Advised to avoid NSAIDs for now

## 2020-11-29 NOTE — Progress Notes (Signed)
Virtual Visit via Telephone Note   This visit type was conducted due to national recommendations for restrictions regarding the COVID-19 Pandemic (e.g. social distancing) in an effort to limit this patient's exposure and mitigate transmission in our community.  Due to her co-morbid illnesses, this patient is at least at moderate risk for complications without adequate follow up.  This format is felt to be most appropriate for this patient at this time.  The patient did not have access to video technology/had technical difficulties with video requiring transitioning to audio format only (telephone).  All issues noted in this document were discussed and addressed.  No physical exam could be performed with this format.   Evaluation Performed:  Follow-up visit  Date:  11/29/2020   ID:  Jennifer [name], Jennifer [name] 1965/07/02, MRN [id number]  Patient Location: Home Provider Location: Office/Clinic  Participants: Patient Location of Patient: Home Location of Provider: Telehealth Consent was obtain for visit to be over via telehealth. I verified that I am speaking with the correct person using two identifiers.  PCP:  Jennifer Larsson, NP   Chief Complaint: Leg swelling  History of Present Illness:    Jennifer [name] is a 55 y.o. female who has a televisit after recent ER visit for leg swelling.  She denies any recent injury.  Her US Doppler of right leg was negative for DVT.  She tried leg elevation for 2 days at home, which has improved her leg swelling.  She complains of easy bruising and she was told of borderline low platelets and she is concerned about it.  She is reassured about the benign variation in platelet counts.  Her platelet count is well WNL in 08/2020.  The patient does not have symptoms concerning for COVID-19 infection (fever, chills, cough, or new shortness of breath).   Past Medical, Surgical, Social History, Allergies, and Medications have been Reviewed.  Past Medical History:   Diagnosis Date   Abdominal pain, epigastric 10/07/2018   Acute non-recurrent maxillary sinusitis 11/07/2019   Anxiety    Arthritis    Binge-eating and purging type anorexia nervosa    Bipolar affective disorder ([place]) 12/04/2015   Bipolar affective disorder, current episode manic without psychotic symptoms ([place]) 12/04/2015   Bipolar disorder ([place])    Cellulitis and abscess of buttock 02/15/2013   Cystitis, interstitial    Depression    Depression    Phreesia 01/28/2020   Esophageal polyp    about 20 years ago   GERD (gastroesophageal reflux disease)    Left wrist pain 03/22/2019   OCD (obsessive compulsive disorder)    Palpitations 05/25/2014   Rectal pain 12/06/2018   Rib pain on right side 01/31/2019   Rosacea 03/27/2019   Sprain of temporomandibular joint or ligament 01/10/2019   Past Surgical History:  Procedure Laterality Date   BLADDER SURGERY     CHOLECYSTECTOMY     COLONOSCOPY WITH PROPOFOL N/A 10/05/2019   Procedure: COLONOSCOPY WITH PROPOFOL;  Surgeon: Daneil Dolin, MD;  [facility];  Service: Endoscopy;  Laterality: N/A;  12:45pm   COSMETIC SURGERY N/A    Phreesia 01/28/2020   ESOPHAGOGASTRODUODENOSCOPY  09/28/2016   [facility]; Dr. Therisa Doyne; erosions in the esophagus, 5 cm hiatal hernia, nonbleeding erosive gastropathy s/p biopsied, normal duodenum.  Path with chronic inactive gastritis, no H. pylori or intestinal metaplasia.    ESOPHAGOGASTRODUODENOSCOPY (EGD) WITH PROPOFOL N/A 10/17/2018   Dr. Gala Romney: mild reflux esophagitis, small hiatal hernia   POLYPECTOMY  10/05/2019   Procedure: POLYPECTOMY;  Surgeon: Daneil Dolin, MD;  [facility];  Service: Endoscopy;;   VOCAL CORD LATERALIZATION, ENDOSCOPIC APPROACH W/ MLB       Current Meds  Medication Sig   Brexpiprazole ([name]) 4 MG TABS Take 4 mg by mouth in the morning.   clonazePAM (KLONOPIN) 0.5 MG tablet Take 1 tablet (0.5 mg total) by mouth 2 (two) times daily as needed for anxiety.    cromolyn (OPTICROM) 4 % ophthalmic solution Place 1 drop into both eyes daily.   diclofenac Sodium (VOLTAREN) 1 % GEL Apply 4 g topically 4 (four) times daily.   dicyclomine (BENTYL) 20 MG tablet Take 1 tablet (20 mg total) by mouth 3 (three) times daily as needed for spasms (abdominal cramping).   Famotidine-Ca Carb-Mag Hydrox (PEPCID COMPLETE PO) Take 1 tablet by mouth daily.   fluvoxaMINE (LUVOX) 100 MG tablet Take 3 tablets (300 mg total) by mouth at bedtime.   gabapentin (NEURONTIN) 600 MG tablet TAKE TWO TABLETS (1200MG  TOTAL) BY MOUTHTWO TIMES DAILY (Patient taking differently: Take 1,200 mg by mouth 2 (two) times daily.)   hydrOXYzine (ATARAX/VISTARIL) 25 MG tablet Take 1 tablet (25 mg total) by mouth 3 (three) times daily as needed. (Patient taking differently: Take 25 mg by mouth 3 (three) times daily as needed for anxiety or itching.)   hyoscyamine (LEVSIN) 0.125 MG tablet Take 1 tablet (0.125 mg total) by mouth every 4 (four) hours as needed (diarrhea).   ibuprofen (ADVIL) 600 MG tablet Take 1 tablet (600 mg total) by mouth every 8 (eight) hours as needed.   loratadine (CLARITIN) 10 MG tablet Take 5 mg by mouth daily as needed for allergies.    naproxen (NAPROSYN) 500 MG tablet Take 500 mg by mouth 2 (two) times daily as needed for migraine.   ondansetron (ZOFRAN-ODT) 4 MG disintegrating tablet TAKE ONE TABLET (4MG  TOTAL) BY MOUTH EVERY 8 HOURS AS NEEDED FOR NAUSEA OR VOMITING (Patient taking differently: Take 4 mg by mouth every 8 (eight) hours as needed for vomiting or nausea.)   pramipexole (MIRAPEX) 0.5 MG tablet Take 1 tablet (0.5 mg total) by mouth at bedtime.   prednisoLONE acetate (PRED FORTE) 1 % ophthalmic suspension Place 1 drop into both eyes in the morning and at bedtime.   RESTASIS 0.05 % ophthalmic emulsion 1 drop 2 (two) times daily.   Rimegepant Sulfate (NURTEC) 75 MG TBDP Take 75 mg by mouth daily as needed (take for abortive therapy of migraine, no more than 1 tablet  in 24 hours or 10 per month).   sodium chloride (OCEAN NASAL SPRAY) 0.65 % nasal spray Place 1 spray into the nose as needed for congestion.   traZODone (DESYREL) 100 MG tablet Take 1-2 tablets (100-200 mg total) by mouth at bedtime as needed for sleep.   valACYclovir (VALTREX) 500 MG tablet Take 500 mg by mouth daily as needed (Flair up take Twice a day  for 3 days and stop).      Allergies:   Codeine, Penicillins, Divalproex sodium, Meloxicam, Sonata [zaleplon], Topamax [topiramate], Aciphex [rabeprazole], Aimovig [erenumab-aooe], and Galcanezumab-gnlm   ROS:   Please see the history of present illness.     All other systems reviewed and are negative.   Labs/Other Tests and Data Reviewed:    Recent Labs: 03/26/2020: Magnesium 2.2; TSH 0.938 11/25/2020: ALT 19; BUN 17; Creatinine, Ser 0.74; Hemoglobin 13.1; Platelets 134; Potassium 3.6; Sodium 140   Recent Lipid Panel Lab Results  Component Value  Date/Time   CHOL 158 03/26/2020 08:17 AM   TRIG 65 03/26/2020 08:17 AM   HDL 55 03/26/2020 08:17 AM   CHOLHDL 2.9 03/26/2020 08:17 AM   CHOLHDL 2.8 12/06/2015 06:09 AM   LDLCALC 90 03/26/2020 08:17 AM    Wt Readings from Last 3 Encounters:  11/25/20 175 lb (79.4 kg)  11/22/20 175 lb (79.4 kg)  11/19/20 172 lb (78 kg)     ASSESSMENT & PLAN:    Leg swelling Recent ER visit-ER chart reviewed including US Doppler of right LE Advised to follow low-salt diet Leg elevation as tolerated Compression socks  Thrombocytopenia (HCC) Borderline low platelets and last CBC We will check CBC after 3 months Complains of easy bruising, which is chronic Advised to avoid NSAIDs for now   Time:   Today, I have spent 15 minutes reviewing the chart, including problem list, medications, and with the patient with telehealth technology discussing the above problems.   Medication Adjustments/Labs and Tests Ordered: Current medicines are reviewed at length with the patient today.  Concerns  regarding medicines are outlined above.   Tests Ordered: No orders of the defined types were placed in this encounter.   Medication Changes: No orders of the defined types were placed in this encounter.    Note: This dictation was prepared with Dragon dictation along with smaller phrase technology. Similar sounding words can be transcribed inadequately or may not be corrected upon review. Any transcriptional errors that result from this process are unintentional.      Disposition:  Follow up  Signed, Lindell Spar, MD  11/29/2020 9:24 AM     [facility]

## 2020-11-29 NOTE — Patient Instructions (Signed)
Please avoid taking NSAIDs such as ibuprofen or Aleve.  Okay to use Tylenol arthritis for joint pain.

## 2020-12-03 ENCOUNTER — Telehealth: Payer: Medicare PPO | Admitting: Physician Assistant

## 2020-12-04 DIAGNOSIS — F3131 Bipolar disorder, current episode depressed, mild: Secondary | ICD-10-CM | POA: Diagnosis not present

## 2020-12-04 DIAGNOSIS — F429 Obsessive-compulsive disorder, unspecified: Secondary | ICD-10-CM | POA: Diagnosis not present

## 2020-12-04 DIAGNOSIS — F502 Bulimia nervosa: Secondary | ICD-10-CM | POA: Diagnosis not present

## 2020-12-04 DIAGNOSIS — F4310 Post-traumatic stress disorder, unspecified: Secondary | ICD-10-CM | POA: Diagnosis not present

## 2020-12-11 DIAGNOSIS — F3131 Bipolar disorder, current episode depressed, mild: Secondary | ICD-10-CM | POA: Diagnosis not present

## 2020-12-11 DIAGNOSIS — F502 Bulimia nervosa: Secondary | ICD-10-CM | POA: Diagnosis not present

## 2020-12-11 DIAGNOSIS — F4310 Post-traumatic stress disorder, unspecified: Secondary | ICD-10-CM | POA: Diagnosis not present

## 2020-12-11 DIAGNOSIS — F429 Obsessive-compulsive disorder, unspecified: Secondary | ICD-10-CM | POA: Diagnosis not present

## 2020-12-20 DIAGNOSIS — F4310 Post-traumatic stress disorder, unspecified: Secondary | ICD-10-CM | POA: Diagnosis not present

## 2020-12-20 DIAGNOSIS — F3131 Bipolar disorder, current episode depressed, mild: Secondary | ICD-10-CM | POA: Diagnosis not present

## 2020-12-20 DIAGNOSIS — F502 Bulimia nervosa: Secondary | ICD-10-CM | POA: Diagnosis not present

## 2020-12-20 DIAGNOSIS — F429 Obsessive-compulsive disorder, unspecified: Secondary | ICD-10-CM | POA: Diagnosis not present

## 2020-12-25 DIAGNOSIS — F429 Obsessive-compulsive disorder, unspecified: Secondary | ICD-10-CM | POA: Diagnosis not present

## 2020-12-25 DIAGNOSIS — F502 Bulimia nervosa: Secondary | ICD-10-CM | POA: Diagnosis not present

## 2020-12-25 DIAGNOSIS — F4310 Post-traumatic stress disorder, unspecified: Secondary | ICD-10-CM | POA: Diagnosis not present

## 2020-12-25 DIAGNOSIS — F3131 Bipolar disorder, current episode depressed, mild: Secondary | ICD-10-CM | POA: Diagnosis not present

## 2020-12-27 ENCOUNTER — Encounter: Payer: Self-pay | Admitting: Physician Assistant

## 2020-12-27 ENCOUNTER — Telehealth (INDEPENDENT_AMBULATORY_CARE_PROVIDER_SITE_OTHER): Payer: Medicare PPO | Admitting: Physician Assistant

## 2020-12-27 DIAGNOSIS — F3130 Bipolar disorder, current episode depressed, mild or moderate severity, unspecified: Secondary | ICD-10-CM | POA: Diagnosis not present

## 2020-12-27 DIAGNOSIS — F429 Obsessive-compulsive disorder, unspecified: Secondary | ICD-10-CM | POA: Diagnosis not present

## 2020-12-27 DIAGNOSIS — G4700 Insomnia, unspecified: Secondary | ICD-10-CM

## 2020-12-27 DIAGNOSIS — F502 Bulimia nervosa: Secondary | ICD-10-CM

## 2020-12-27 MED ORDER — HYDROXYZINE HCL 25 MG PO TABS: 25.0000 mg | ORAL_TABLET | Freq: Three times a day (TID) | ORAL | 11 refills | Status: DC | PRN

## 2020-12-27 MED ORDER — TRAZODONE HCL 100 MG PO TABS: 100.0000 mg | ORAL_TABLET | Freq: Every evening | ORAL | 11 refills | Status: DC | PRN

## 2020-12-27 MED ORDER — REXULTI 4 MG PO TABS: 4.0000 mg | ORAL_TABLET | Freq: Every morning | ORAL | 11 refills | Status: DC

## 2020-12-27 NOTE — Progress Notes (Signed)
[facility]  Patient ID: Jennifer [name],  MRN: [id number]  PCP: Noreene Larsson, NP  Date of Evaluation: 12/27/2020 Time spent:30 minutes  Chief Complaint:  Chief Complaint   Anxiety; Depression; Insomnia; Follow-up    Virtual Visit via Telehealth  I connected with patient by a video enabled telemedicine application with their informed consent, and verified patient privacy and that I am speaking with the correct person using two identifiers.  I am private, in my office and the patient is at home.  I discussed the limitations, risks, security and privacy concerns of performing an evaluation and management service by video and the availability of in person appointments. I also discussed with the patient that there may be a patient responsible charge related to this service. The patient expressed understanding and agreed to proceed.   I discussed the assessment and treatment plan with the patient. The patient was provided an opportunity to ask questions and all were answered. The patient agreed with the plan and demonstrated an understanding of the instructions.   The patient was advised to call back or seek an in-person evaluation if the symptoms worsen or if the condition fails to improve as anticipated.  I provided 30 minutes of non-face-to-face time during this encounter.   HISTORY/CURRENT STATUS: HPI For routine med check.  Insomnia, mostly waking up in the middle of the night with racing thoughts and has a hard time going back to sleep, can sometimes take 1 or more hours.  This has been going on for "a while."  Not napping during the day, no caffeine, not on the computer close to bedtime at all.  Has been a little depressed.  States she gets teary a couple of days a week, can last for a few hours.  No known reason.  She does walk daily 30 minutes sometimes twice a day and that did help her mood but does not seem to be helping anymore.  She does enjoy things, energy and  motivation are fair to good, depending on how well she slept the night before.  She is not isolating.  No suicidal or homicidal thoughts.  She does have some anxiety about the holidays, she is not taking the Klonopin very often though.  When she does wake up in the middle of the night she does obsess about things but otherwise not so much during the day.  Patient denies increased energy with decreased need for sleep, no increased talkativeness, no racing thoughts, no impulsivity or risky behaviors, no increased spending, no increased libido, no grandiosity, no increased irritability or anger, and no hallucinations.  Denies dizziness, syncope, seizures, numbness, tingling, tremor, tics, unsteady gait, slurred speech, confusion.  Has chronic joint pain.  No binging or purging.  Does have chronic GERD but meds are helping.  Individual Medical History/ Review of Systems: Changes? :No      Past medications for mental health diagnoses include: Risperdal, Seroquel, Prozac, Zoloft,  Wellbutrin, Lamictal, Depakote caused hair loss, Xanax, Ambien, trazodone, Trileptal, Luvox, Topamax, Elavil, Pamelor, BuSpar, doxazosin, prazosin, lithium, Vraylar, Abilify, [name], Latuda >60 mg caused abd pain and nausea  Allergies: Codeine, Penicillins, Divalproex sodium, Meloxicam, Sonata [zaleplon], Topamax [topiramate], Aciphex [rabeprazole], Aimovig [erenumab-aooe], and Galcanezumab-gnlm  Current Medications:  Current Outpatient Medications:    clonazePAM (KLONOPIN) 0.5 MG tablet, Take 1 tablet (0.5 mg total) by mouth 2 (two) times daily as needed for anxiety., Disp: 20 tablet, Rfl: 5   cromolyn (OPTICROM) 4 % ophthalmic solution, Place 1 drop into  both eyes daily., Disp: , Rfl:    diclofenac Sodium (VOLTAREN) 1 % GEL, Apply 4 g topically 4 (four) times daily., Disp: 50 g, Rfl: 0   Famotidine-Ca Carb-Mag Hydrox (PEPCID COMPLETE PO), Take 1 tablet by mouth daily., Disp: , Rfl:    fluvoxaMINE (LUVOX) 100 MG tablet,  Take 3 tablets (300 mg total) by mouth at bedtime., Disp: 270 tablet, Rfl: 3   gabapentin (NEURONTIN) 600 MG tablet, TAKE TWO TABLETS (1200MG  TOTAL) BY MOUTHTWO TIMES DAILY (Patient taking differently: Take 1,200 mg by mouth 2 (two) times daily.), Disp: 120 tablet, Rfl: 5   hyoscyamine (LEVSIN) 0.125 MG tablet, Take 1 tablet (0.125 mg total) by mouth every 4 (four) hours as needed (diarrhea)., Disp: 120 tablet, Rfl: 1   loratadine (CLARITIN) 10 MG tablet, Take 5 mg by mouth daily as needed for allergies. , Disp: , Rfl:    naproxen (NAPROSYN) 500 MG tablet, Take 500 mg by mouth 2 (two) times daily as needed for migraine., Disp: , Rfl:    ondansetron (ZOFRAN-ODT) 4 MG disintegrating tablet, TAKE ONE TABLET (4MG  TOTAL) BY MOUTH EVERY 8 HOURS AS NEEDED FOR NAUSEA OR VOMITING (Patient taking differently: Take 4 mg by mouth every 8 (eight) hours as needed for vomiting or nausea.), Disp: 30 tablet, Rfl: 1   pramipexole (MIRAPEX) 0.5 MG tablet, Take 1 tablet (0.5 mg total) by mouth at bedtime., Disp: 90 tablet, Rfl: 1   prednisoLONE acetate (PRED FORTE) 1 % ophthalmic suspension, Place 1 drop into both eyes in the morning and at bedtime., Disp: , Rfl:    RESTASIS 0.05 % ophthalmic emulsion, 1 drop 2 (two) times daily., Disp: , Rfl:    Rimegepant Sulfate (NURTEC) 75 MG TBDP, Take 75 mg by mouth daily as needed (take for abortive therapy of migraine, no more than 1 tablet in 24 hours or 10 per month)., Disp: 10 tablet, Rfl: 11   sodium chloride (OCEAN NASAL SPRAY) 0.65 % nasal spray, Place 1 spray into the nose as needed for congestion., Disp: 30 mL, Rfl: 12   valACYclovir (VALTREX) 500 MG tablet, Take 500 mg by mouth daily as needed (Flair up take Twice a day  for 3 days and stop). , Disp: , Rfl:    Brexpiprazole ([name]) 4 MG TABS, Take 4 mg by mouth in the morning., Disp: 30 tablet, Rfl: 11   dicyclomine (BENTYL) 20 MG tablet, Take 1 tablet (20 mg total) by mouth 3 (three) times daily as needed for spasms  (abdominal cramping). (Patient not taking: Reported on 12/27/2020), Disp: 20 tablet, Rfl: 0   hydrOXYzine (ATARAX) 25 MG tablet, Take 1-2 tablets (25-50 mg total) by mouth every 8 (eight) hours as needed., Disp: 90 tablet, Rfl: 11   traZODone (DESYREL) 100 MG tablet, Take 1-2 tablets (100-200 mg total) by mouth at bedtime as needed for sleep., Disp: 60 tablet, Rfl: 11 Medication Side Effects: none  Family Medical/ Social History: Changes?  No  MENTAL HEALTH EXAM:  Last menstrual period 10/07/2011.There is no height or weight on file to calculate BMI.  General Appearance: Casual and Well Groomed  Eye Contact:  Good  Speech:  Clear and Coherent and Normal Rate  Volume:  Normal  Mood:  Euthymic  Affect:  Appropriate  Thought Process:  Goal Directed and Descriptions of Associations: Intact  Orientation:  Full (Time, Place, and Person)  Thought Content: Logical   Suicidal Thoughts:  No  Homicidal Thoughts:  No  Memory:  WNL  Judgement:  Good  Insight:  Good  Psychomotor Activity:  Normal  Concentration:  Concentration: Good and Attention Span: Good  Recall:  Good  Fund of Knowledge: Good  Language: Good  Assets:  Desire for Improvement  ADL's:  Intact  Cognition: WNL  Prognosis:  Good    DIAGNOSES:    ICD-10-CM   1. Obsessive-compulsive disorder, unspecified type  F42.9     2. Insomnia, unspecified type  G47.00     3. Bipolar I disorder, most recent episode depressed ([place])  F31.30     4. Bulimia  F50.2          Receiving Psychotherapy: Yes [facility]   RECOMMENDATIONS:  PDMP was reviewed.  Last Klonopin filled 04/17/2020. I provided 30  minutes of non-face-to-face time during this encounter, including time spent before and after the visit in records review, medical decision making, counseling pertinent to today's visit, and charting.  Sleep hygiene discussed. Mood therapy lamp for symptoms of depression.  Need to sit beside it for 30 minutes every morning.  No  medication changes for depression at this time, she states the trazodone helps depression and if she forgets to take it then she feels more sad the next day. Continue [name] 4 mg, 1 p.o. daily. Continue Klonopin 0.5 mg, 1 p.o. twice daily as needed. Continue Luvox 100 mg, 3 p.o. nightly. Continue gabapentin 600 mg, 2 p.o. twice daily. Continue hydroxyzine 25-50 mg, 1 p.o. 3 times daily as needed.  Try taking it at bedtime along with the trazodone to see if that will help with racing thoughts and keep her asleep. Continue pramipexole 0.5 mg nightly.  Per another provider Continue trazodone 100 mg, 1-2 nightly as needed sleep. Continue therapy. Return in 2 months.  [name], PA-C

## 2020-12-30 ENCOUNTER — Encounter: Payer: Self-pay | Admitting: Neurology

## 2020-12-30 ENCOUNTER — Ambulatory Visit: Payer: Medicare PPO | Admitting: Neurology

## 2020-12-30 VITALS — BP 135/83 | HR 83 | Ht 64.0 in | Wt 172.0 lb

## 2020-12-30 DIAGNOSIS — G2581 Restless legs syndrome: Secondary | ICD-10-CM

## 2020-12-30 DIAGNOSIS — F3160 Bipolar disorder, current episode mixed, unspecified: Secondary | ICD-10-CM

## 2020-12-30 DIAGNOSIS — G629 Polyneuropathy, unspecified: Secondary | ICD-10-CM | POA: Diagnosis not present

## 2020-12-30 DIAGNOSIS — R0683 Snoring: Secondary | ICD-10-CM | POA: Diagnosis not present

## 2020-12-30 DIAGNOSIS — G4763 Sleep related bruxism: Secondary | ICD-10-CM

## 2020-12-30 DIAGNOSIS — I639 Cerebral infarction, unspecified: Secondary | ICD-10-CM

## 2020-12-30 DIAGNOSIS — I739 Peripheral vascular disease, unspecified: Secondary | ICD-10-CM | POA: Diagnosis not present

## 2020-12-30 DIAGNOSIS — Z716 Tobacco abuse counseling: Secondary | ICD-10-CM

## 2020-12-30 DIAGNOSIS — G43601 Persistent migraine aura with cerebral infarction, not intractable, with status migrainosus: Secondary | ICD-10-CM

## 2020-12-30 DIAGNOSIS — F502 Bulimia nervosa: Secondary | ICD-10-CM

## 2020-12-30 MED ORDER — NURTEC 75 MG PO TBDP: 75.0000 mg | ORAL_TABLET | Freq: Every day | ORAL | 11 refills | Status: DC | PRN

## 2020-12-30 MED ORDER — PRAMIPEXOLE DIHYDROCHLORIDE 0.5 MG PO TABS: 0.5000 mg | ORAL_TABLET | Freq: Every day | ORAL | 3 refills | Status: DC

## 2020-12-30 NOTE — Patient Instructions (Signed)
Restless Legs Syndrome ?Restless legs syndrome is a condition that causes uncomfortable feelings or sensations in the legs, especially while sitting or lying down. The sensations usually cause an overwhelming urge to move the legs. The arms can also sometimes be affected. ?The condition can range from mild to severe. The symptoms often interfere with a person's ability to sleep. ?What are the causes? ?The cause of this condition is not known. ?What increases the risk? ?The following factors may make you more likely to develop this condition: ?Being older than 50. ?Pregnancy. ?Being a woman. In general, the condition is more common in women than in men. ?A family history of the condition. ?Having iron deficiency. ?Overuse of caffeine, nicotine, or alcohol. ?Certain medical conditions, such as kidney disease, Parkinson's disease, or nerve damage. ?Certain medicines, such as those for high blood pressure, nausea, colds, allergies, depression, and some heart conditions. ?What are the signs or symptoms? ?The main symptom of this condition is uncomfortable sensations in the legs, such as: ?Pulling. ?Tingling. ?Prickling. ?Throbbing. ?Crawling. ?Burning. ?Usually, the sensations: ?Affect both sides of the body. ?Are worse when you sit or lie down. ?Are worse at night. These may make it difficult to fall asleep. ?Make you have a strong urge to move your legs. ?Are temporarily relieved by moving your legs or standing. ?The arms can also be affected, but this is rare. People who have this condition often have tiredness during the day because of their lack of sleep at night. ?How is this diagnosed? ?This condition may be diagnosed based on: ?Your symptoms. ?Blood tests. ?In some cases, you may be monitored in a sleep lab by a specialist (a sleep study). This can detect any disruptions in your sleep. ?How is this treated? ?This condition is treated by managing the symptoms. This may include: ?Lifestyle changes, such as  exercising, using relaxation techniques, and avoiding caffeine, alcohol, or tobacco. ?Iron supplements. ?Medicines. Parkinson's medications may be tried first. Anti-seizure medications can also be helpful. ?Follow these instructions at home: ?General instructions ?Take over-the-counter and prescription medicines only as told by your health care provider. ?Use methods to help relieve the uncomfortable sensations, such as: ?Massaging your legs. ?Walking or stretching. ?Taking a cold or hot bath. ?Keep all follow-up visits. This is important. ?Lifestyle ?  ?Practice good sleep habits. For example, go to bed and get up at the same time every day. Most adults should get 7-9 hours of sleep each night. ?Exercise regularly. Try to get at least 30 minutes of exercise most days of the week. ?Practice ways of relaxing, such as yoga or meditation. ?Avoid caffeine and alcohol. ?Do not use any products that contain nicotine or tobacco. These products include cigarettes, chewing tobacco, and vaping devices, such as e-cigarettes. If you need help quitting, ask your health care provider. ?Where to find more information ?National Institute of Neurological Disorders and Stroke: www.ninds.nih.gov ?Contact a health care provider if: ?Your symptoms get worse or they do not improve with treatment. ?Summary ?Restless legs syndrome is a condition that causes uncomfortable feelings or sensations in the legs, especially while sitting or lying down. ?The symptoms often interfere with your ability to sleep. ?This condition is treated by managing the symptoms. You may need to make lifestyle changes or take medicines. ?This information is not intended to replace advice given to you by your health care provider. Make sure you discuss any questions you have with your health care provider. ?Document Revised: 08/18/2020 Document Reviewed: 08/18/2020 ?[employer]vier Patient Education ? 2022   [employer] ? ?

## 2020-12-30 NOTE — Progress Notes (Signed)
SLEEP MEDICINE CLINIC    Provider:  Larey Seat, MD  Primary Care Physician:  Noreene Larsson, NP [address]  [place] [address] [place] [address]     Referring Provider: Noreene Larsson, [place]  [place] [address] [place],  [place] [address]          Chief Complaint according to patient   Patient presents with:     New Patient (Initial Visit)           HISTORY OF PRESENT ILLNESS:  Jennifer [name] is a 55 y.o. year old White or Caucasian female patient seen here as a referral on 12/30/2020 from PCP NP Ronne Binning at [facility]-  for a sleep consultation. The patient has been seen here for Migraines in the past. She is on Nurtec, sees Amy Lomax.  .  Chief concern according to patient :  Wants to discuss peripheral NP (?) she is a smoker- and has RLS. New PCP has written for pramipexole but is now leaving the practice.   The patient reports that she developed burning sensation and dysesthesias on the top of her foot the back of the toes and midfoot.  This could affect her on and off it was not necessarily predictable at which time of the day or which close or shoes she would wear but she was evaluated by her GI physician who then basically stated that she should start to walk every day.   He apparently assumed that this was not a neuropathy but a peripheral vasculopathy.  The patient states that after 1 week of regular walking she already noticed a significant improvement and she now does no longer have these dysesthesias or cramping in her feet.  She still complains of the irresistible urge to move at night the feeling that she has to always stretch.    YOMARIS PALECEK   has a past medical history of Abdominal pain, epigastric (10/07/2018), Acute non-recurrent maxillary sinusitis (11/07/2019), Anxiety, Arthritis, Binge-eating and purging type anorexia nervosa, Bipolar affective disorder ([place]) (12/04/2015), Bipolar affective disorder, current episode manic  without psychotic symptoms ([place]) (12/04/2015), Bipolar disorder ([place]), Cellulitis and abscess of buttock (02/15/2013), Cystitis, interstitial,   Depression, Esophageal polyp, GERD (gastroesophageal reflux disease), Left wrist pain (03/22/2019), OCD (obsessive compulsive disorder), Palpitations (05/25/2014)..   The patient never had a sleep study. .    Sleep relevant medical history: RLS for 3-5 months, and dyseasthesias has improved with daily walking. Active smoker, PVD?  She reports anemia. Bruising easily, low platelets.  Family medical /sleep history: No  other family member on CPAP with OSA, Father had RLS, alcoholic cirrhosis and was a COPD smoker.      Social history:  Patient is disabled due to mental health issues- OCD, Depression, and eating disorders. In therapy and lives in a household with spouse and dog .  Current Tobacco use1 ppD.   ETOH use none,  Caffeine intake in form of Coffee( /) Soda( 2 cans a day) Tea (  when eating out ) or energy drinks. Regular exercise in form of walking , of  30 minutes , 1.5 miles a day.   Hobbies :       Sleep habits are as follows: The patient's dinner time is between 7.30PM. The patient goes to bed at 9  PM and takes Trazodone and Hydroxyzine -continues to sleep for intervals of 2-3  hours, wakes for 2-3 bathroom breaks, the first time at 1  AM.  Interstitial cystitis, RLS keeping her form sleep, now on Mirapex and much improved.  The preferred sleep position is supine, with the support of 1-2 pillows. Dreams are reportedly frequent/vivid. She snores loudly. Had enuresis before. Nocturia.  8  AM is the usual rise time. The patient wakes up with an alarm 7.30 AM.  She reports not feeling refreshed or restored in AM, with symptoms such as dry mouth, morning headaches, and residual fatigue. Naps are taken infrequently, lasting from  to 20 minutes and are more/ 30 less refreshing than nocturnal sleep.    Review of Systems: Out of a complete 14  system review, the patient complains of only the following symptoms, and all other reviewed systems are negative.:   Reports ear pain, sinus pressure, nausea. Dysmetria.  Fatigue, sleepiness   How likely are you to doze in the following situations: 0 = not likely, 1 = slight chance, 2 = moderate chance, 3 = high chance   Sitting and Reading? Watching Television? Sitting inactive in a public place (theater or meeting)? As a passenger in a car for an hour without a break? Lying down in the afternoon when circumstances permit? Sitting and talking to someone? Sitting quietly after lunch without alcohol? In a car, while stopped for a few minutes in traffic?   Total = 12/ 24 points   FSS endorsed at 40/ 63 points.   Social History   Socioeconomic History   Marital status: Married    Spouse name: [name]   Number of children: 0   Years of education: Not on file   Highest education level: Not on file  Occupational History   Not on file  Tobacco Use   Smoking status: Every Day    Packs/day: 1.00    Years: 20.00    Pack years: 20.00    Types: Cigarettes    Start date: 01/20/1988   Smokeless tobacco: Never  Vaping Use   Vaping Use: Never used  Substance and Sexual Activity   Alcohol use: No    Alcohol/week: 0.0 standard drinks   Drug use: No   Sexual activity: Yes    Birth control/protection: None  Other Topics Concern   Not on file  Social History Narrative   Lives with husband -[name] of 49 years       Yorkie- Max      Enjoys: reading-all genres      Diet: eats all food groups   Caffeine: 1 cup daily at times, diet dr pepper daily   Water: gatorade  zero and water       Wears seat belt    Does not use phone while driving   Smoke and Development worker, 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         Social Determinants of Radio broadcast assistant Strain: Low Risk    Difficulty of Paying Living Expenses: Not hard at all  Food Insecurity: No Food  Insecurity   Worried About Charity fundraiser in the Last Year: Never true   Arboriculturist in the Last Year: Never true  Transportation Needs: No Transportation Needs   Lack of Transportation (Medical): No   Lack of Transportation (Non-Medical): No  Physical Activity: Insufficiently Active   Days of Exercise per Week: 7 days   Minutes of Exercise per Session: 20 min  Stress: Stress Concern Present   Feeling of Stress : To some extent  Social Connections: Moderately  Integrated   Frequency of Communication with Friends and Family: More than three times a week   Frequency of Social Gatherings with Friends and Family: Once a week   Attends Religious Services: More than 4 times per year   Active Member of [employer] or Organizations: No   Attends Music therapist: Never   Marital Status: Married    Family History  Problem Relation Age of Onset   Healthy Mother    Healthy Father    Colon cancer Neg Hx    Colon polyps Neg Hx     Past Medical History:  Diagnosis Date   Abdominal pain, epigastric 10/07/2018   Acute non-recurrent maxillary sinusitis 11/07/2019   Anxiety    Arthritis    Binge-eating and purging type anorexia nervosa    Bipolar affective disorder ([place]) 12/04/2015   Bipolar affective disorder, current episode manic without psychotic symptoms ([place]) 12/04/2015   Bipolar disorder ([place])    Cellulitis and abscess of buttock 02/15/2013   Cystitis, interstitial    Depression    Depression    Phreesia 01/28/2020   Esophageal polyp    about 20 years ago   GERD (gastroesophageal reflux disease)    Left wrist pain 03/22/2019   OCD (obsessive compulsive disorder)    Palpitations 05/25/2014   Rectal pain 12/06/2018   Rib pain on right side 01/31/2019   Rosacea 03/27/2019   Sprain of temporomandibular joint or ligament 01/10/2019    Past Surgical History:  Procedure Laterality Date   BLADDER SURGERY     CHOLECYSTECTOMY     COLONOSCOPY WITH PROPOFOL N/A  10/05/2019   Procedure: COLONOSCOPY WITH PROPOFOL;  Surgeon: Daneil Dolin, MD;  [facility];  Service: Endoscopy;  Laterality: N/A;  12:45pm   COSMETIC SURGERY N/A    Phreesia 01/28/2020   ESOPHAGOGASTRODUODENOSCOPY  09/28/2016   [facility]; Dr. Therisa Doyne; erosions in the esophagus, 5 cm hiatal hernia, nonbleeding erosive gastropathy s/p biopsied, normal duodenum.  Path with chronic inactive gastritis, no H. pylori or intestinal metaplasia.    ESOPHAGOGASTRODUODENOSCOPY (EGD) WITH PROPOFOL N/A 10/17/2018   Dr. Gala Romney: mild reflux esophagitis, small hiatal hernia   POLYPECTOMY  10/05/2019   Procedure: POLYPECTOMY;  Surgeon: Daneil Dolin, MD;  [facility];  Service: Endoscopy;;   VOCAL CORD LATERALIZATION, ENDOSCOPIC APPROACH W/ MLB       Current Outpatient Medications on File Prior to Visit  Medication Sig Dispense Refill   Brexpiprazole ([name]) 4 MG TABS Take 4 mg by mouth in the morning. 30 tablet 11   clonazePAM (KLONOPIN) 0.5 MG tablet Take 1 tablet (0.5 mg total) by mouth 2 (two) times daily as needed for anxiety. 20 tablet 5   cromolyn (OPTICROM) 4 % ophthalmic solution Place 1 drop into both eyes 2 (two) times daily.     diclofenac Sodium (VOLTAREN) 1 % GEL Apply 4 g topically 4 (four) times daily. 50 g 0   dicyclomine (BENTYL) 20 MG tablet Take 1 tablet (20 mg total) by mouth 3 (three) times daily as needed for spasms (abdominal cramping). 20 tablet 0   Famotidine-Ca Carb-Mag Hydrox (PEPCID COMPLETE PO) Take 1 tablet by mouth daily.     fluvoxaMINE (LUVOX) 100 MG tablet Take 3 tablets (300 mg total) by mouth at bedtime. 270 tablet 3   gabapentin (NEURONTIN) 600 MG tablet TAKE TWO TABLETS (1200MG  TOTAL) BY MOUTHTWO TIMES DAILY (Patient taking differently: Take 1,200 mg by mouth 2 (two) times daily.) 120 tablet 5  hydrOXYzine (ATARAX) 25 MG tablet Take 1-2 tablets (25-50 mg total) by mouth every 8 (eight) hours as needed. 90 tablet 11   hyoscyamine (LEVSIN) 0.125  MG tablet Take 1 tablet (0.125 mg total) by mouth every 4 (four) hours as needed (diarrhea). 120 tablet 1   loratadine (CLARITIN) 10 MG tablet Take 5 mg by mouth daily as needed for allergies.      naproxen (NAPROSYN) 500 MG tablet Take 500 mg by mouth 2 (two) times daily as needed for migraine.     ondansetron (ZOFRAN-ODT) 4 MG disintegrating tablet TAKE ONE TABLET (4MG  TOTAL) BY MOUTH EVERY 8 HOURS AS NEEDED FOR NAUSEA OR VOMITING (Patient taking differently: Take 4 mg by mouth every 8 (eight) hours as needed for vomiting or nausea.) 30 tablet 1   pramipexole (MIRAPEX) 0.5 MG tablet Take 1 tablet (0.5 mg total) by mouth at bedtime. 90 tablet 1   prednisoLONE acetate (PRED FORTE) 1 % ophthalmic suspension Place 1 drop into both eyes in the morning and at bedtime.     RESTASIS 0.05 % ophthalmic emulsion 1 drop 2 (two) times daily.     Rimegepant Sulfate (NURTEC) 75 MG TBDP Take 75 mg by mouth daily as needed (take for abortive therapy of migraine, no more than 1 tablet in 24 hours or 10 per month). 10 tablet 11   sodium chloride (OCEAN NASAL SPRAY) 0.65 % nasal spray Place 1 spray into the nose as needed for congestion. 30 mL 12   traZODone (DESYREL) 100 MG tablet Take 1-2 tablets (100-200 mg total) by mouth at bedtime as needed for sleep. 60 tablet 11   valACYclovir (VALTREX) 500 MG tablet Take 500 mg by mouth daily as needed (Flair up take Twice a day  for 3 days and stop).      No current facility-administered medications on file prior to visit.    Allergies  Allergen Reactions   Codeine Shortness Of Breath, Nausea Only and Rash   Penicillins Hives, Shortness Of Breath, Swelling and Rash    Has patient had a PCN reaction causing immediate rash, facial/tongue/throat swelling, SOB or lightheadedness with hypotension: yes Has patient had a PCN reaction causing severe rash involving mucus membranes or skin necrosis: no Has patient had a PCN reaction that required hospitalization: no Has patient  had a PCN reaction occurring within the last 10 years: no If all of the above answers are "NO", then may proceed with Cephalosporin use.;      Divalproex Sodium Hives, Itching and Rash   Meloxicam Other (See Comments)    Possible chest tightness - instructed by MD not to take   Sonata [Zaleplon] Other (See Comments)    Hallucinations   Topamax [Topiramate] Other (See Comments)    Low BP and dizziness   Aciphex [Rabeprazole] Swelling   Aimovig [Erenumab-Aooe] Itching   Galcanezumab-Gnlm Rash and Hives       important suggestion  Newer results are available. Click to view them now.   Component Ref Range & Units 4 mo ago  (08/20/20) 9 mo ago  (03/26/20) 1 yr ago  (03/20/19) 2 yr ago  (10/27/18) 2 yr ago  (07/07/18) 2 yr ago  (05/04/18) 3 yr ago  (12/06/17)  WBC 3.4 - 10.8 x10E3/uL 7.5  7.6  6.0 R  7.5 R  7.1 R  9.9 R  6.7 R   RBC 3.77 - 5.28 x10E6/uL 4.74  4.89  4.37 R  4.58 R  4.87 R  4.57 R  4.46  R   Hemoglobin 11.1 - 15.9 g/dL 14.7  14.8  13.4 R  13.5 R  14.2 R  13.7 R  13.3 R   Hematocrit 34.0 - 46.6 % 42.4  43.1  40.1 R  41.1 R  43.1 R  40.8 R  38.6 R   MCV 79 - 97 fL 90  88  91.8 R  89.7 R  88.5 R  89.3 R  86.5 R   MCH 26.6 - 33.0 pg 31.0  30.3  30.7 R  29.5 R  29.2 R  30.0 R  29.8 R   MCHC 31.5 - 35.7 g/dL 34.7  34.3  33.4 R  32.8 R  32.9 R  33.6 R  34.5 R   RDW 11.7 - 15.4 % 12.2  12.8  12.9 R  13.5 R  13.6 R  12.8 R  13.0 R   Platelets 150 - 450 x10E3/uL 162   187 R  201 R  205 R  184 R  204 R   Neutrophils Not Estab. % 57  59  60 R  44 R   53 R  54.3 R   Lymphs Not Estab. % 29  29        Monocytes Not Estab. % 10  8        Eos Not Estab. % 3  3        Basos Not Estab. % 1  1        Neutrophils Absolute 1.4 - 7.0 x10E3/uL 4.3  4.5  3.6 R  3.4 R   5.3 R  3,638 R   Lymphocytes Absolute 0.7 - 3.1 x10E3/uL 2.2  2.2  1.8 R  3.1 R   3.3 R  2,157 R   Monocytes Absolute 0.1 - 0.9 x10E3/uL 0.8  0.6        EOS (ABSOLUTE) 0.0 - 0.4 x10E3/uL 0.2  0.2        Basophils Absolute 0.0 - 0.2  x10E3/uL 0.1  0.1  0.0 R  0.1 R   0.1 R  60 R   Immature Granulocytes Not Estab. % 0  0  0 R  0 R   0 R    Immature Grans (Abs) 0.0 - 0.1 x10E3/uL 0.0  0.0        nRBC    0.0 R  0.0 R  0.0 R, CM  0.0 R    WBC mixed population        623 R   Total Lymphocyte        32.2 R   Lymphocytes Relative    30 R  41 R   34 R    Monocytes Relative    8 R  11 R   10 R  9.3 R   Monocytes Absolute    0.5 R  0.8 R   1.0 R    Eosinophils Relative    1 R  3 R   2 R  3.3 R   Eosinophils Absolute    0.1 R  0.2 R   0.2 R  221 R   Basophils Relative    1 R  1 R   1 R  0.9 R   Abs Immature Granulocytes    0.02 R, CM  0.01 R, CM   0.02 R, CM    MPV        11.7 R       Physical exam:  Today's Vitals   12/30/20 1342  BP: 135/83  Pulse: 83  Weight: 172 lb (78 kg)  Height: 5\' 4"  (1.626 m)   Body mass index is 29.52 kg/m.   Wt Readings from Last 3 Encounters:  12/30/20 172 lb (78 kg)  11/25/20 175 lb (79.4 kg)  11/22/20 175 lb (79.4 kg)     Ht Readings from Last 3 Encounters:  12/30/20 5\' 4"  (1.626 m)  11/25/20 5\' 4"  (1.626 m)  11/22/20 5\' 4"  (1.626 m)      General: The patient is awake, alert and appears not in acute distress. The patient is well groomed. Head: Normocephalic, atraumatic. Neck is supple.  Mallampati 1,  neck circumference:15 inches . Nasal airflow barely patent.  Retrognathia is not seen.  Dental status: biological   Otoscopic - normal eardrums, clean external canal. Patient felt urge to cough and almost vomit when examined in the right ear.  Cardiovascular:  Regular rate and cardiac rhythm by pulse,  without distended neck veins. Respiratory: Lungs are clear to auscultation.  Skin:  Without evidence of ankle edema, or rash. Trunk: The patient's posture is erect.   Neurologic exam : The patient is awake and alert, oriented to place and time.   Memory subjective described as intact.  Attention span & concentration ability appears normal.  Speech is fluent,  without   dysarthria, dysphonia or aphasia.  Mood and affect are appropriate.   Cranial nerves: no loss of smell or taste reported  Pupils are equal and briskly reactive to light. Funduscopic exam deferred. .  Extraocular movements in vertical and horizontal planes were intact and without nystagmus. No Diplopia. Visual fields by finger perimetry are intact. She reports intermittent left eye lid droop.  Hearing was intact to soft voice and finger rubbing.    Facial sensation intact to fine touch. Sensitive rigger points on the forehead over left temple and just above center of the left eyebrow.   Facial motor strength is symmetric and tongue and uvula move midline.  Neck ROM : rotation, tilt and flexion extension were normal for age and shoulder shrug was symmetrical.    Motor exam:  Symmetric bulk, tone and ROM.   Normal tone without cog wheeling, symmetric grip strength .   Sensory:  Fine touch, pinprick and vibration were tested  and  normal.  Proprioception tested in the upper extremities was normal.   Coordination: Rapid alternating movements in the fingers/hands were of abnormal slowness-  The Finger-to-nose maneuver was intact with evidence of  dysmetria , slow movement and dysmetria on the  left.  Gait and station: Patient could rise unassisted from a seated position, walked without assistive device.  Stance is of normal width/ base and the patient turned with 3 steps.  Toe and heel walk were deferred.  Deep tendon reflexes: in the  upper and lower extremities are symmetric and intact.  Babinski response was deferred.       After spending a total time of  50  minutes face to face and additional time for physical and neurologic examination, review of laboratory studies,  personal review of imaging studies, reports and results of other testing and review of referral information / records as far as provided in visit, I have established the following assessments:  1) Mrs. Wormley is an  established headache patient in this practice and was just seen in this July by Debbora Presto, NP.  Her migrainous headaches have improved on the Nurtec.    2)Just about the same time as she was last  seen here she developed what her gastroenterologist identified as restless legs.  Primary care has prescribed pramipexole the generic form of Mirapex for her but her primary care provider is now leaving the practice and she would like it refilled.  It has helped.  3) her husband has told her that she snores loudly and she prefers sleeping supine.  She also noticed sinus pressure still being present and especially when she cleans her right ear it seems to set in motion a shooting pain and nausea that she identifies as quite severe.  4)She also has a tendency to bruise very easily and was just recently diagnosed with low platelet counts.  She has an eating disorder, bulimia and OCD, depression.  She may loose a lot of vitamins and electrolytes this way.  Hypokalemia.     5)The original referral for a new problem was for dysesthesias peripheral neuropathy work-up.  It seems that the patient's neuropathy is not present.  She had normal vibration sense into both ankles there was no decreased level of sensation in her feet.  Based on this I think she is more likely to have Peripheral vascular disease.   Her gastroenterology wisely recommended for her to walk every day and it seems that this regimen has improved her dysesthesias and I would identify those as most likely from peripheral vascular disease.  She is still an active smoker about a pack a day and we have discussed that this needs to be addressed as it will hinder her to age healthy.   My Plan is to proceed with:  1) HST to rule out apnea with snoring and morning headaches. No sleep paralysis   2) refill pramipexol for RLS, 0.5 mg daly, one hour before bedtime. Continue walking.  3) Vein and vascular testing ordered- evaluate for peripheral circulation,  PVD. Not a PN by exam.  4) smoking cessation 5, check ferritin, TIBC, history of iron def. Partially from [place].   I would like to thank Noreene Larsson, NP and Noreene Larsson, [facility]  [address] [place],  [place] [address] for allowing me to meet with and to take care of this pleasant patient.   In short, JAMAIA BRUM is presenting with RLS and possible PVD.   I plan to follow up either personally or through our NP within 3-5 month.   CC: I will share my notes with PCP and GI specialist, Dr. Sydell Axon.  Electronically signed by: Larey Seat, MD 12/30/2020 1:51 PM  [facility] Board certified by The [employer] of Sleep Medicine and Diplomate of the [employer] of Sleep Medicine. Board certified In Neurology through the [place], Fellow of the [employer] of Neurology. Medical Director of [employer].

## 2020-12-31 ENCOUNTER — Other Ambulatory Visit: Payer: Self-pay | Admitting: Neurology

## 2020-12-31 ENCOUNTER — Encounter: Payer: Self-pay | Admitting: Neurology

## 2020-12-31 ENCOUNTER — Telehealth: Payer: Self-pay | Admitting: Neurology

## 2020-12-31 LAB — IRON,TIBC AND FERRITIN PANEL
Ferritin: 177 ng/mL — ABNORMAL HIGH (ref 15–150)
Iron Saturation: 32 % (ref 15–55)
Iron: 81 ug/dL (ref 27–159)
Total Iron Binding Capacity: 252 ug/dL (ref 250–450)
UIBC: 171 ug/dL (ref 131–425)

## 2020-12-31 MED ORDER — ONDANSETRON 4 MG PO TBDP: 4.0000 mg | ORAL_TABLET | Freq: Three times a day (TID) | ORAL | 1 refills | Status: DC | PRN

## 2020-12-31 NOTE — Telephone Encounter (Signed)
It means no deficiency of iron protein shuttle ( Fewrritin) and that low iron is not a contributor to RLS.

## 2020-12-31 NOTE — Telephone Encounter (Signed)
[employer] pending uploaded notes on the portal  

## 2021-01-01 DIAGNOSIS — F3131 Bipolar disorder, current episode depressed, mild: Secondary | ICD-10-CM | POA: Diagnosis not present

## 2021-01-01 DIAGNOSIS — F502 Bulimia nervosa: Secondary | ICD-10-CM | POA: Diagnosis not present

## 2021-01-01 DIAGNOSIS — F429 Obsessive-compulsive disorder, unspecified: Secondary | ICD-10-CM | POA: Diagnosis not present

## 2021-01-01 DIAGNOSIS — F4310 Post-traumatic stress disorder, unspecified: Secondary | ICD-10-CM | POA: Diagnosis not present

## 2021-01-02 NOTE — Telephone Encounter (Signed)
Mcarthur Rossetti Josem Kaufmann: [phone] (exp. 12/31/20 to 01/30/21) order sent to GI, they will reach out to the patient to schedule.

## 2021-01-06 ENCOUNTER — Other Ambulatory Visit: Payer: Self-pay

## 2021-01-06 ENCOUNTER — Encounter: Payer: Self-pay | Admitting: Nurse Practitioner

## 2021-01-06 ENCOUNTER — Ambulatory Visit (INDEPENDENT_AMBULATORY_CARE_PROVIDER_SITE_OTHER): Payer: Medicare PPO | Admitting: Nurse Practitioner

## 2021-01-06 DIAGNOSIS — J40 Bronchitis, not specified as acute or chronic: Secondary | ICD-10-CM

## 2021-01-06 MED ORDER — PREDNISONE 20 MG PO TABS: 40.0000 mg | ORAL_TABLET | Freq: Every day | ORAL | 0 refills | Status: DC

## 2021-01-06 MED ORDER — ALBUTEROL SULFATE HFA 108 (90 BASE) MCG/ACT IN AERS: 2.0000 | INHALATION_SPRAY | Freq: Four times a day (QID) | RESPIRATORY_TRACT | 0 refills | Status: DC | PRN

## 2021-01-06 MED ORDER — AZITHROMYCIN 250 MG PO TABS: ORAL_TABLET | ORAL | 0 refills | Status: DC

## 2021-01-06 MED ORDER — BENZONATATE 100 MG PO CAPS: 100.0000 mg | ORAL_CAPSULE | Freq: Two times a day (BID) | ORAL | 0 refills | Status: DC | PRN

## 2021-01-06 NOTE — Assessment & Plan Note (Signed)
-  z-pack, prednisone, albuterol, and tessalon perles prescribed -no covid/flu testing today d/t time of onset 1-2 weeks ago, so antivirals wouldn't help -if no improvement in 1 week, may consider cxr

## 2021-01-06 NOTE — Progress Notes (Signed)
Acute Office Visit  Subjective:    Patient ID: Jennifer [name], female    DOB: Oct 04, 1965, 55 y.o.   MRN: [id number]  Chief Complaint  Patient presents with   Cough   Nausea    Cough,nausea,runny nose x 2 days    Cough Associated symptoms include postnasal drip, rhinorrhea and shortness of breath. Pertinent negatives include no chills, fever, sore throat or wheezing.  Patient is in today for sick visit. Symptoms got worse yesterday, but started 1-2 weeks ago.  Past Medical History:  Diagnosis Date   Abdominal pain, epigastric 10/07/2018   Acute non-recurrent maxillary sinusitis 11/07/2019   Anxiety    Arthritis    Binge-eating and purging type anorexia nervosa    Bipolar affective disorder ([place]) 12/04/2015   Bipolar affective disorder, current episode manic without psychotic symptoms ([place]) 12/04/2015   Bipolar disorder ([place])    Cellulitis and abscess of buttock 02/15/2013   Cystitis, interstitial    Depression    Depression    Phreesia 01/28/2020   Esophageal polyp    about 20 years ago   GERD (gastroesophageal reflux disease)    Left wrist pain 03/22/2019   OCD (obsessive compulsive disorder)    Palpitations 05/25/2014   Rectal pain 12/06/2018   Rib pain on right side 01/31/2019   Rosacea 03/27/2019   Sprain of temporomandibular joint or ligament 01/10/2019    Past Surgical History:  Procedure Laterality Date   BLADDER SURGERY     CHOLECYSTECTOMY     COLONOSCOPY WITH PROPOFOL N/A 10/05/2019   Procedure: COLONOSCOPY WITH PROPOFOL;  Surgeon: Daneil Dolin, MD;  [facility];  Service: Endoscopy;  Laterality: N/A;  12:45pm   COSMETIC SURGERY N/A    Phreesia 01/28/2020   ESOPHAGOGASTRODUODENOSCOPY  09/28/2016   [facility]; Dr. Therisa Doyne; erosions in the esophagus, 5 cm hiatal hernia, nonbleeding erosive gastropathy s/p biopsied, normal duodenum.  Path with chronic inactive gastritis, no H. pylori or intestinal metaplasia.    ESOPHAGOGASTRODUODENOSCOPY (EGD)  WITH PROPOFOL N/A 10/17/2018   Dr. Gala Romney: mild reflux esophagitis, small hiatal hernia   POLYPECTOMY  10/05/2019   Procedure: POLYPECTOMY;  Surgeon: Daneil Dolin, MD;  [facility];  Service: Endoscopy;;   VOCAL CORD LATERALIZATION, ENDOSCOPIC APPROACH W/ MLB      Family History  Problem Relation Age of Onset   Healthy Mother    Healthy Father    Colon cancer Neg Hx    Colon polyps Neg Hx     Social History   Socioeconomic History   Marital status: Married    Spouse name: Todd   Number of children: 0   Years of education: Not on file   Highest education level: Not on file  Occupational History   Not on file  Tobacco Use   Smoking status: Every Day    Packs/day: 1.00    Years: 20.00    Pack years: 20.00    Types: Cigarettes    Start date: 01/20/1988   Smokeless tobacco: Never  Vaping Use   Vaping Use: Never used  Substance and Sexual Activity   Alcohol use: No    Alcohol/week: 0.0 standard drinks   Drug use: No   Sexual activity: Yes    Birth control/protection: None  Other Topics Concern   Not on file  Social History Narrative   Lives with husband -[name] of 39 years       Yorkie- Max      Enjoys: reading-all genres  Diet: eats all food groups   Caffeine: 1 cup daily at times, diet dr pepper daily   Water: gatorade  zero and water       Wears seat belt    Does not use phone while driving   Smoke and Development worker, 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         Social Determinants of Radio broadcast assistant Strain: Low Risk    Difficulty of Paying Living Expenses: Not hard at all  Food Insecurity: No Food Insecurity   Worried About Charity fundraiser in the Last Year: Never true   Arboriculturist in the Last Year: Never true  Transportation Needs: No Transportation Needs   Lack of Transportation (Medical): No   Lack of Transportation (Non-Medical): No  Physical Activity: Insufficiently Active   Days of Exercise per  Week: 7 days   Minutes of Exercise per Session: 20 min  Stress: Stress Concern Present   Feeling of Stress : To some extent  Social Connections: Moderately Integrated   Frequency of Communication with Friends and Family: More than three times a week   Frequency of Social Gatherings with Friends and Family: Once a week   Attends Religious Services: More than 4 times per year   Active Member of [employer] or Organizations: No   Attends Archivist Meetings: Never   Marital Status: Married  Human resources officer Violence: Unknown   Fear of Current or Ex-Partner: No   Emotionally Abused: No   Physically Abused: No   Sexually Abused: Not on file    Outpatient Medications Prior to Visit  Medication Sig Dispense Refill   Brexpiprazole ([name]) 4 MG TABS Take 4 mg by mouth in the morning. 30 tablet 11   clonazePAM (KLONOPIN) 0.5 MG tablet Take 1 tablet (0.5 mg total) by mouth 2 (two) times daily as needed for anxiety. 20 tablet 5   cromolyn (OPTICROM) 4 % ophthalmic solution Place 1 drop into both eyes 2 (two) times daily.     diclofenac Sodium (VOLTAREN) 1 % GEL Apply 4 g topically 4 (four) times daily. 50 g 0   dicyclomine (BENTYL) 20 MG tablet Take 1 tablet (20 mg total) by mouth 3 (three) times daily as needed for spasms (abdominal cramping). 20 tablet 0   Famotidine-Ca Carb-Mag Hydrox (PEPCID COMPLETE PO) Take 1 tablet by mouth daily.     fluvoxaMINE (LUVOX) 100 MG tablet Take 3 tablets (300 mg total) by mouth at bedtime. 270 tablet 3   gabapentin (NEURONTIN) 600 MG tablet TAKE TWO TABLETS ($RemoveBefo'1200MG'VbWgvmkHlac$  TOTAL) BY MOUTHTWO TIMES DAILY (Patient taking differently: Take 1,200 mg by mouth 2 (two) times daily.) 120 tablet 5   hydrOXYzine (ATARAX) 25 MG tablet Take 1-2 tablets (25-50 mg total) by mouth every 8 (eight) hours as needed. 90 tablet 11   hyoscyamine (LEVSIN) 0.125 MG tablet Take 1 tablet (0.125 mg total) by mouth every 4 (four) hours as needed (diarrhea). 120 tablet 1   loratadine  (CLARITIN) 10 MG tablet Take 5 mg by mouth daily as needed for allergies.      naproxen (NAPROSYN) 500 MG tablet Take 500 mg by mouth 2 (two) times daily as needed for migraine.     ondansetron (ZOFRAN-ODT) 4 MG disintegrating tablet Take 1 tablet (4 mg total) by mouth every 8 (eight) hours as needed for nausea or vomiting. 30 tablet 1   pramipexole (MIRAPEX) 0.5 MG tablet Take 1 tablet (  0.5 mg total) by mouth at bedtime. 90 tablet 3   prednisoLONE acetate (PRED FORTE) 1 % ophthalmic suspension Place 1 drop into both eyes in the morning and at bedtime.     RESTASIS 0.05 % ophthalmic emulsion 1 drop 2 (two) times daily.     Rimegepant Sulfate (NURTEC) 75 MG TBDP Take 75 mg by mouth daily as needed (take for abortive therapy of migraine, no more than 1 tablet in 24 hours or 10 per month). 8 tablet 11   sodium chloride (OCEAN NASAL SPRAY) 0.65 % nasal spray Place 1 spray into the nose as needed for congestion. 30 mL 12   traZODone (DESYREL) 100 MG tablet Take 1-2 tablets (100-200 mg total) by mouth at bedtime as needed for sleep. 60 tablet 11   valACYclovir (VALTREX) 500 MG tablet Take 500 mg by mouth daily as needed (Flair up take Twice a day  for 3 days and stop).      No facility-administered medications prior to visit.    Allergies  Allergen Reactions   Codeine Shortness Of Breath, Nausea Only and Rash   Penicillins Hives, Shortness Of Breath, Swelling and Rash    Has patient had a PCN reaction causing immediate rash, facial/tongue/throat swelling, SOB or lightheadedness with hypotension: yes Has patient had a PCN reaction causing severe rash involving mucus membranes or skin necrosis: no Has patient had a PCN reaction that required hospitalization: no Has patient had a PCN reaction occurring within the last 10 years: no If all of the above answers are "NO", then may proceed with Cephalosporin use.;      Divalproex Sodium Hives, Itching and Rash   Meloxicam Other (See Comments)     Possible chest tightness - instructed by MD not to take   Sonata [Zaleplon] Other (See Comments)    Hallucinations   Topamax [Topiramate] Other (See Comments)    Low BP and dizziness   Aciphex [Rabeprazole] Swelling   Aimovig [Erenumab-Aooe] Itching   Galcanezumab-Gnlm Rash and Hives    Review of Systems  Constitutional:  Positive for fatigue. Negative for chills and fever.  HENT:  Positive for congestion, postnasal drip, rhinorrhea, sinus pressure and sinus pain. Negative for sore throat and trouble swallowing.   Respiratory:  Positive for cough and shortness of breath. Negative for wheezing.        SOB with exertion  Gastrointestinal:  Positive for nausea and vomiting.      Objective:    Physical Exam  LMP 10/07/2011  Wt Readings from Last 3 Encounters:  12/30/20 172 lb (78 kg)  11/25/20 175 lb (79.4 kg)  11/22/20 175 lb (79.4 kg)    Health Maintenance Due  Topic Date Due   HIV Screening  Never done   PAP SMEAR-Modifier  05/23/2016   Zoster Vaccines- Shingrix (2 of 2) 01/18/2019   MAMMOGRAM  06/21/2020   COVID-19 Vaccine (5 - Booster for Moderna series) 09/13/2020    There are no preventive care reminders to display for this patient.   Lab Results  Component Value Date   TSH 0.938 03/26/2020   Lab Results  Component Value Date   WBC 7.5 11/25/2020   HGB 13.1 11/25/2020   HCT 39.7 11/25/2020   MCV 93.4 11/25/2020   PLT 134 (L) 11/25/2020   Lab Results  Component Value Date   NA 140 11/25/2020   K 3.6 11/25/2020   CO2 27 11/25/2020   GLUCOSE 110 (H) 11/25/2020   BUN 17 11/25/2020   CREATININE 0.74  11/25/2020   BILITOT 0.3 11/25/2020   ALKPHOS 83 11/25/2020   AST 21 11/25/2020   ALT 19 11/25/2020   PROT 6.6 11/25/2020   ALBUMIN 4.1 11/25/2020   CALCIUM 8.9 11/25/2020   ANIONGAP 6 11/25/2020   EGFR 79 08/20/2020   Lab Results  Component Value Date   CHOL 158 03/26/2020   Lab Results  Component Value Date   HDL 55 03/26/2020   Lab Results   Component Value Date   LDLCALC 90 03/26/2020   Lab Results  Component Value Date   TRIG 65 03/26/2020   Lab Results  Component Value Date   CHOLHDL 2.9 03/26/2020   Lab Results  Component Value Date   HGBA1C 5.7 (H) 03/26/2020       Assessment & Plan:   Problem List Items Addressed This Visit       Respiratory   Bronchitis - Primary    -z-pack, prednisone, albuterol, and tessalon perles prescribed -no covid/flu testing today d/t time of onset 1-2 weeks ago, so antivirals wouldn't help -if no improvement in 1 week, may consider cxr      Relevant Medications   azithromycin (ZITHROMAX) 250 MG tablet   predniSONE (DELTASONE) 20 MG tablet   albuterol (VENTOLIN HFA) 108 (90 Base) MCG/ACT inhaler   benzonatate (TESSALON) 100 MG capsule     Meds ordered this encounter  Medications   azithromycin (ZITHROMAX) 250 MG tablet    Sig: Please dispense as a z-pack    Dispense:  6 tablet    Refill:  0   predniSONE (DELTASONE) 20 MG tablet    Sig: Take 2 tablets (40 mg total) by mouth daily with breakfast.    Dispense:  10 tablet    Refill:  0   albuterol (VENTOLIN HFA) 108 (90 Base) MCG/ACT inhaler    Sig: Inhale 2 puffs into the lungs every 6 (six) hours as needed for wheezing or shortness of breath.    Dispense:  8 g    Refill:  0   benzonatate (TESSALON) 100 MG capsule    Sig: Take 1 capsule (100 mg total) by mouth 2 (two) times daily as needed for cough.    Dispense:  20 capsule    Refill:  0   Date:  01/06/2021   Location of Patient: Home Location of Provider: Office Consent was obtain for visit to be over via telehealth. I verified that I am speaking with the correct person using two identifiers.  I connected with  [name] on 01/06/21 via telephone and verified that I am speaking with the correct person using two identifiers.   I discussed the limitations of evaluation and management by telemedicine. The patient expressed understanding and agreed to  proceed.  Time spent: 6 min     Noreene Larsson, NP

## 2021-01-07 ENCOUNTER — Telehealth: Payer: Medicare PPO | Admitting: Nurse Practitioner

## 2021-01-08 DIAGNOSIS — F502 Bulimia nervosa: Secondary | ICD-10-CM | POA: Diagnosis not present

## 2021-01-08 DIAGNOSIS — F3131 Bipolar disorder, current episode depressed, mild: Secondary | ICD-10-CM | POA: Diagnosis not present

## 2021-01-08 DIAGNOSIS — F429 Obsessive-compulsive disorder, unspecified: Secondary | ICD-10-CM | POA: Diagnosis not present

## 2021-01-08 DIAGNOSIS — F4310 Post-traumatic stress disorder, unspecified: Secondary | ICD-10-CM | POA: Diagnosis not present

## 2021-01-15 ENCOUNTER — Encounter: Payer: Self-pay | Admitting: Internal Medicine

## 2021-01-15 DIAGNOSIS — F429 Obsessive-compulsive disorder, unspecified: Secondary | ICD-10-CM | POA: Diagnosis not present

## 2021-01-15 DIAGNOSIS — F3131 Bipolar disorder, current episode depressed, mild: Secondary | ICD-10-CM | POA: Diagnosis not present

## 2021-01-15 DIAGNOSIS — F502 Bulimia nervosa: Secondary | ICD-10-CM | POA: Diagnosis not present

## 2021-01-15 DIAGNOSIS — F4310 Post-traumatic stress disorder, unspecified: Secondary | ICD-10-CM | POA: Diagnosis not present

## 2021-01-21 ENCOUNTER — Other Ambulatory Visit: Payer: Self-pay

## 2021-01-21 ENCOUNTER — Ambulatory Visit
Admission: EM | Admit: 2021-01-21 | Discharge: 2021-01-21 | Disposition: A | Discharge status: Home / Self Care | Payer: Medicare PPO | Attending: Urgent Care | Admitting: Urgent Care

## 2021-01-21 ENCOUNTER — Encounter: Payer: Self-pay | Admitting: Nurse Practitioner

## 2021-01-21 ENCOUNTER — Encounter: Payer: Self-pay | Admitting: Emergency Medicine

## 2021-01-21 ENCOUNTER — Telehealth (INDEPENDENT_AMBULATORY_CARE_PROVIDER_SITE_OTHER): Payer: Medicare PPO | Admitting: Nurse Practitioner

## 2021-01-21 DIAGNOSIS — L308 Other specified dermatitis: Secondary | ICD-10-CM | POA: Diagnosis not present

## 2021-01-21 DIAGNOSIS — R350 Frequency of micturition: Secondary | ICD-10-CM | POA: Insufficient documentation

## 2021-01-21 DIAGNOSIS — B3731 Acute candidiasis of vulva and vagina: Secondary | ICD-10-CM | POA: Diagnosis not present

## 2021-01-21 LAB — POCT URINALYSIS DIP (MANUAL ENTRY)
Bilirubin, UA: NEGATIVE
Blood, UA: NEGATIVE
Glucose, UA: NEGATIVE mg/dL
Ketones, POC UA: NEGATIVE mg/dL
Leukocytes, UA: NEGATIVE
Nitrite, UA: NEGATIVE
Protein Ur, POC: NEGATIVE mg/dL
Spec Grav, UA: 1.01 (ref 1.010–1.025)
Urobilinogen, UA: 0.2 E.U./dL
pH, UA: 6 (ref 5.0–8.0)

## 2021-01-21 MED ORDER — BETAMETHASONE DIPROPIONATE 0.05 % EX CREA: TOPICAL_CREAM | Freq: Two times a day (BID) | CUTANEOUS | 0 refills | Status: DC

## 2021-01-21 MED ORDER — FLUCONAZOLE 150 MG PO TABS: 150.0000 mg | ORAL_TABLET | ORAL | 0 refills | Status: DC

## 2021-01-21 NOTE — Progress Notes (Signed)
Virtual Visit via video Note  I connected with Jennifer [name]  on 01/21/21 at  1:22 pm by video and verified that I am speaking with the correct person using two identifiers. I spent 7 minutes talking to the pt and reviewing her chart.   Location: Patient: home Provider: office   I discussed the limitations, risks, security and privacy concerns of performing an evaluation and management service by telephone and the availability of in person appointments. I also discussed with the patient that there may be a patient responsible charge related to this service. The patient expressed understanding and agreed to proceed.   History of Present Illness: Pt c/o of bilateral thumb cracking's. She stated that her thumbs gets cracked like this during the winter time,her previous  PCP thinks its eczema and orders a medication for her , she can not remember the name of the cream. The cracklings she has currently started about 3 weeks ago., it bleeds and hurts when  when she puts pressure on it . Pt denies greenish yellowish discharge, fever, chills, body aches,    Observations/Objective: Cracking noted on bilateral thumbs, thumbs does not appear infected on video.   Assessment and Plan: Hyperkeratotic hand eczema. Use betamemethasone dipropionate 0.05% cream BID  Avoid triggers, skin irritants. Use  unscented soap, lukewarm water to wash hands Call the office if symptoms get worse or if she develops fever, chills, body aches, notices greenish yellowish discharge.   Follow Up Instructions:    I discussed the assessment and treatment plan with the patient. The patient was provided an opportunity to ask questions and all were answered. The patient agreed with the plan and demonstrated an understanding of the instructions.   The patient was advised to call back or seek an in-person evaluation if the symptoms worsen or if the condition fails to improve as anticipated.

## 2021-01-21 NOTE — Assessment & Plan Note (Addendum)
Use betamemethasone dipropionate 0.05% cream BID  Avoid triggers, skin irritants. Use  unscented soap, lukewarm water to wash hands Call the office if symptoms get worse or if she develops fever, chills body aches, notices greenish yellowish discharge

## 2021-01-21 NOTE — ED Provider Notes (Signed)
[facility]   MRN: [id number] DOB: 22-Dec-1965  Subjective:   Jennifer [name] is a 56 y.o. female presenting for 2-day history of acute onset vaginal itching, vaginal burning, redness around the vagina, urinary frequency.  Patient is not opposed to cervical swab.  No fever, nausea, vomiting, abdominal or pelvic pain.  No current facility-administered medications for this encounter.  Current Outpatient Medications:    albuterol (VENTOLIN HFA) 108 (90 Base) MCG/ACT inhaler, Inhale 2 puffs into the lungs every 6 (six) hours as needed for wheezing or shortness of breath., Disp: 8 g, Rfl: 0   benzonatate (TESSALON) 100 MG capsule, Take 1 capsule (100 mg total) by mouth 2 (two) times daily as needed for cough., Disp: 20 capsule, Rfl: 0   betamethasone dipropionate 0.05 % cream, Apply topically 2 (two) times daily., Disp: 30 g, Rfl: 0   Brexpiprazole ([name]) 4 MG TABS, Take 4 mg by mouth in the morning., Disp: 30 tablet, Rfl: 11   clonazePAM (KLONOPIN) 0.5 MG tablet, Take 1 tablet (0.5 mg total) by mouth 2 (two) times daily as needed for anxiety., Disp: 20 tablet, Rfl: 5   cromolyn (OPTICROM) 4 % ophthalmic solution, Place 1 drop into both eyes 2 (two) times daily., Disp: , Rfl:    diclofenac Sodium (VOLTAREN) 1 % GEL, Apply 4 g topically 4 (four) times daily., Disp: 50 g, Rfl: 0   dicyclomine (BENTYL) 20 MG tablet, Take 1 tablet (20 mg total) by mouth 3 (three) times daily as needed for spasms (abdominal cramping)., Disp: 20 tablet, Rfl: 0   Famotidine-Ca Carb-Mag Hydrox (PEPCID COMPLETE PO), Take 1 tablet by mouth daily., Disp: , Rfl:    fluvoxaMINE (LUVOX) 100 MG tablet, Take 3 tablets (300 mg total) by mouth at bedtime., Disp: 270 tablet, Rfl: 3   gabapentin (NEURONTIN) 600 MG tablet, TAKE TWO TABLETS (1200MG  TOTAL) BY MOUTHTWO TIMES DAILY (Patient taking differently: Take 1,200 mg by mouth 2 (two) times daily.), Disp: 120 tablet, Rfl: 5   hydrOXYzine (ATARAX) 25 MG  tablet, Take 1-2 tablets (25-50 mg total) by mouth every 8 (eight) hours as needed., Disp: 90 tablet, Rfl: 11   hyoscyamine (LEVSIN) 0.125 MG tablet, Take 1 tablet (0.125 mg total) by mouth every 4 (four) hours as needed (diarrhea)., Disp: 120 tablet, Rfl: 1   loratadine (CLARITIN) 10 MG tablet, Take 5 mg by mouth daily as needed for allergies. , Disp: , Rfl:    naproxen (NAPROSYN) 500 MG tablet, Take 500 mg by mouth 2 (two) times daily as needed for migraine., Disp: , Rfl:    ondansetron (ZOFRAN-ODT) 4 MG disintegrating tablet, Take 1 tablet (4 mg total) by mouth every 8 (eight) hours as needed for nausea or vomiting., Disp: 30 tablet, Rfl: 1   pramipexole (MIRAPEX) 0.5 MG tablet, Take 1 tablet (0.5 mg total) by mouth at bedtime., Disp: 90 tablet, Rfl: 3   prednisoLONE acetate (PRED FORTE) 1 % ophthalmic suspension, Place 1 drop into both eyes in the morning and at bedtime., Disp: , Rfl:    RESTASIS 0.05 % ophthalmic emulsion, 1 drop 2 (two) times daily., Disp: , Rfl:    Rimegepant Sulfate (NURTEC) 75 MG TBDP, Take 75 mg by mouth daily as needed (take for abortive therapy of migraine, no more than 1 tablet in 24 hours or 10 per month)., Disp: 8 tablet, Rfl: 11   sodium chloride (OCEAN NASAL SPRAY) 0.65 % nasal spray, Place 1 spray into the nose as needed for congestion., Disp: 30 mL,  Rfl: 12   traZODone (DESYREL) 100 MG tablet, Take 1-2 tablets (100-200 mg total) by mouth at bedtime as needed for sleep., Disp: 60 tablet, Rfl: 11   valACYclovir (VALTREX) 500 MG tablet, Take 500 mg by mouth daily as needed (Flair up take Twice a day  for 3 days and stop). , Disp: , Rfl:    Allergies  Allergen Reactions   Codeine Shortness Of Breath, Nausea Only and Rash   Penicillins Hives, Shortness Of Breath, Swelling and Rash    Has patient had a PCN reaction causing immediate rash, facial/tongue/throat swelling, SOB or lightheadedness with hypotension: yes Has patient had a PCN reaction causing  severe rash involving mucus membranes or skin necrosis: no Has patient had a PCN reaction that required hospitalization: no Has patient had a PCN reaction occurring within the last 10 years: no If all of the above answers are "NO", then may proceed with Cephalosporin use.;      Divalproex Sodium Hives, Itching and Rash   Meloxicam Other (See Comments)    Possible chest tightness - instructed by MD not to take   Sonata [Zaleplon] Other (See Comments)    Hallucinations   Topamax [Topiramate] Other (See Comments)    Low BP and dizziness   Aciphex [Rabeprazole] Swelling   Aimovig [Erenumab-Aooe] Itching   Galcanezumab-Gnlm Rash and Hives    Past Medical History:  Diagnosis Date   Abdominal pain, epigastric 10/07/2018   Acute non-recurrent maxillary sinusitis 11/07/2019   Anxiety    Arthritis    Binge-eating and purging type anorexia nervosa    Bipolar affective disorder ([place]) 12/04/2015   Bipolar affective disorder, current episode manic without psychotic symptoms ([place]) 12/04/2015   Bipolar disorder ([place])    Cellulitis and abscess of buttock 02/15/2013   Cystitis, interstitial    Depression    Depression    Phreesia 01/28/2020   Esophageal polyp    about 20 years ago   GERD (gastroesophageal reflux disease)    Left wrist pain 03/22/2019   OCD (obsessive compulsive disorder)    Palpitations 05/25/2014   Rectal pain 12/06/2018   Rib pain on right side 01/31/2019   Rosacea 03/27/2019   Sprain of temporomandibular joint or ligament 01/10/2019     Past Surgical History:  Procedure Laterality Date   BLADDER SURGERY     CHOLECYSTECTOMY     COLONOSCOPY WITH PROPOFOL N/A 10/05/2019   Procedure: COLONOSCOPY WITH PROPOFOL;  Surgeon: Daneil Dolin, MD;  [facility];  Service: Endoscopy;  Laterality: N/A;  12:45pm   COSMETIC SURGERY N/A    Phreesia 01/28/2020   ESOPHAGOGASTRODUODENOSCOPY  09/28/2016   [facility]; Dr. Therisa Doyne; erosions in  the esophagus, 5 cm hiatal hernia, nonbleeding erosive gastropathy s/p biopsied, normal duodenum.  Path with chronic inactive gastritis, no H. pylori or intestinal metaplasia.    ESOPHAGOGASTRODUODENOSCOPY (EGD) WITH PROPOFOL N/A 10/17/2018   Dr. Gala Romney: mild reflux esophagitis, small hiatal hernia   POLYPECTOMY  10/05/2019   Procedure: POLYPECTOMY;  Surgeon: Daneil Dolin, MD;  [facility];  Service: Endoscopy;;   VOCAL CORD LATERALIZATION, ENDOSCOPIC APPROACH W/ MLB      Family History  Problem Relation Age of Onset   Healthy Mother    Healthy Father    Colon cancer Neg Hx    Colon polyps Neg Hx     Social History   Tobacco Use   Smoking status: Every Day    Packs/day: 1.00    Years: 20.00  Pack years: 20.00    Types: Cigarettes    Start date: 01/20/1988   Smokeless tobacco: Never  Vaping Use   Vaping Use: Never used  Substance Use Topics   Alcohol use: No    Alcohol/week: 0.0 standard drinks   Drug use: No    ROS   Objective:   Vitals: BP 124/83 (BP Location: Right Arm)    Pulse 77    Temp 98.5 F (36.9 C) (Oral)    Resp 18    LMP 10/07/2011    SpO2 94%   Physical Exam Exam conducted with a chaperone present Investment banker, corporate Manuela Schwartz).  Constitutional:      General: She is not in acute distress.    Appearance: Normal appearance. She is well-developed. She is not ill-appearing, toxic-appearing or diaphoretic.  HENT:     Head: Normocephalic and atraumatic.     Right Ear: External ear normal.     Left Ear: External ear normal.     Nose: Nose normal.     Mouth/Throat:     Mouth: Mucous membranes are moist.  Eyes:     General: No scleral icterus.       Right eye: No discharge.        Left eye: No discharge.     Extraocular Movements: Extraocular movements intact.     Conjunctiva/sclera: Conjunctivae normal.  Cardiovascular:     Rate and Rhythm: Normal rate.  Pulmonary:     Effort: Pulmonary effort is normal.  Abdominal:     General: Bowel sounds  are normal. There is no distension.     Palpations: Abdomen is soft. There is no mass.     Tenderness: There is no abdominal tenderness. There is no right CVA tenderness, left CVA tenderness, guarding or rebound.  Genitourinary:    Labia:        Right: No rash, tenderness or lesion.        Left: No rash, tenderness or lesion.   Skin:    General: Skin is warm and dry.  Neurological:     General: No focal deficit present.     Mental Status: She is alert and oriented to person, place, and time.  Psychiatric:        Mood and Affect: Mood normal.        Behavior: Behavior normal.        Thought Content: Thought content normal.        Judgment: Judgment normal.    Results for orders placed or performed during the hospital encounter of 01/21/21 (from the past 24 hour(s))  POCT urinalysis dipstick     Status: None   Collection Time: 01/21/21  8:02 PM  Result Value Ref Range   Color, UA yellow yellow   Clarity, UA clear clear   Glucose, UA negative negative mg/dL   Bilirubin, UA negative negative   Ketones, POC UA negative negative mg/dL   Spec Grav, UA 1.010 1.010 - 1.025   Blood, UA negative negative   pH, UA 6.0 5.0 - 8.0   Protein Ur, POC negative negative mg/dL   Urobilinogen, UA 0.2 0.2 or 1.0 E.U./dL   Nitrite, UA Negative Negative   Leukocytes, UA Negative Negative    Assessment and Plan :   PDMP not reviewed this encounter.  1. Yeast vaginitis   2. Urinary frequency    Start Diflucan to cover for yeast vaginitis.  Labs pending. Counseled patient on potential for adverse effects with medications prescribed/recommended today, ER and return-to-clinic precautions  discussed, patient verbalized understanding.    Jaynee Eagles, [place] 01/24/21 [phone]

## 2021-01-21 NOTE — ED Triage Notes (Signed)
Burning and itching x 2 days.  States area around vagina is red and swollen.

## 2021-01-22 ENCOUNTER — Telehealth: Payer: Self-pay | Admitting: Neurology

## 2021-01-22 DIAGNOSIS — F3131 Bipolar disorder, current episode depressed, mild: Secondary | ICD-10-CM | POA: Diagnosis not present

## 2021-01-22 DIAGNOSIS — F429 Obsessive-compulsive disorder, unspecified: Secondary | ICD-10-CM | POA: Diagnosis not present

## 2021-01-22 DIAGNOSIS — F4310 Post-traumatic stress disorder, unspecified: Secondary | ICD-10-CM | POA: Diagnosis not present

## 2021-01-22 DIAGNOSIS — F502 Bulimia nervosa: Secondary | ICD-10-CM | POA: Diagnosis not present

## 2021-01-22 NOTE — Telephone Encounter (Signed)
Called pt to schedule her sleep study but vm was full.

## 2021-01-23 LAB — CERVICOVAGINAL ANCILLARY ONLY
Bacterial Vaginitis (gardnerella): NEGATIVE
Candida Glabrata: NEGATIVE
Candida Vaginitis: NEGATIVE
Chlamydia: NEGATIVE
Comment: NEGATIVE
Comment: NEGATIVE
Comment: NEGATIVE
Comment: NEGATIVE
Comment: NEGATIVE
Comment: NORMAL
Neisseria Gonorrhea: NEGATIVE
Trichomonas: NEGATIVE

## 2021-01-26 ENCOUNTER — Ambulatory Visit
Admission: RE | Admit: 2021-01-26 | Discharge: 2021-01-26 | Disposition: A | Discharge status: Home / Self Care | Payer: Medicare PPO | Source: Ambulatory Visit | Attending: Neurology | Admitting: Neurology

## 2021-01-26 ENCOUNTER — Other Ambulatory Visit: Payer: Self-pay

## 2021-01-26 DIAGNOSIS — F502 Bulimia nervosa: Secondary | ICD-10-CM

## 2021-01-26 DIAGNOSIS — I739 Peripheral vascular disease, unspecified: Secondary | ICD-10-CM

## 2021-01-26 DIAGNOSIS — R0683 Snoring: Secondary | ICD-10-CM | POA: Diagnosis not present

## 2021-01-26 DIAGNOSIS — G2581 Restless legs syndrome: Secondary | ICD-10-CM

## 2021-01-26 DIAGNOSIS — F3160 Bipolar disorder, current episode mixed, unspecified: Secondary | ICD-10-CM

## 2021-01-26 DIAGNOSIS — G43601 Persistent migraine aura with cerebral infarction, not intractable, with status migrainosus: Secondary | ICD-10-CM

## 2021-01-26 DIAGNOSIS — I639 Cerebral infarction, unspecified: Secondary | ICD-10-CM | POA: Diagnosis not present

## 2021-01-26 MED ORDER — GADOBENATE DIMEGLUMINE 529 MG/ML IV SOLN
15.0000 mL | Freq: Once | INTRAVENOUS | Status: AC | PRN
  Administered 2021-01-26: 15 mL via INTRAVENOUS

## 2021-01-27 ENCOUNTER — Encounter: Payer: Self-pay | Admitting: Neurology

## 2021-01-28 ENCOUNTER — Ambulatory Visit: Payer: Medicare PPO | Admitting: Cardiovascular Disease

## 2021-01-29 DIAGNOSIS — F4310 Post-traumatic stress disorder, unspecified: Secondary | ICD-10-CM | POA: Diagnosis not present

## 2021-01-29 DIAGNOSIS — F3131 Bipolar disorder, current episode depressed, mild: Secondary | ICD-10-CM | POA: Diagnosis not present

## 2021-01-29 DIAGNOSIS — F502 Bulimia nervosa: Secondary | ICD-10-CM | POA: Diagnosis not present

## 2021-01-29 DIAGNOSIS — F429 Obsessive-compulsive disorder, unspecified: Secondary | ICD-10-CM | POA: Diagnosis not present

## 2021-01-31 DIAGNOSIS — A609 Anogenital herpesviral infection, unspecified: Secondary | ICD-10-CM | POA: Diagnosis not present

## 2021-01-31 DIAGNOSIS — L309 Dermatitis, unspecified: Secondary | ICD-10-CM | POA: Insufficient documentation

## 2021-01-31 DIAGNOSIS — L292 Pruritus vulvae: Secondary | ICD-10-CM | POA: Diagnosis not present

## 2021-02-04 ENCOUNTER — Ambulatory Visit: Payer: Medicare PPO | Admitting: Internal Medicine

## 2021-02-04 ENCOUNTER — Telehealth: Payer: Self-pay | Admitting: Neurology

## 2021-02-04 ENCOUNTER — Encounter: Payer: Self-pay | Admitting: Internal Medicine

## 2021-02-04 ENCOUNTER — Other Ambulatory Visit: Payer: Self-pay

## 2021-02-04 VITALS — BP 114/75 | HR 71 | Temp 95.3°F | Ht 64.0 in | Wt 171.4 lb

## 2021-02-04 DIAGNOSIS — K219 Gastro-esophageal reflux disease without esophagitis: Secondary | ICD-10-CM

## 2021-02-04 DIAGNOSIS — K760 Fatty (change of) liver, not elsewhere classified: Secondary | ICD-10-CM

## 2021-02-04 DIAGNOSIS — F502 Bulimia nervosa: Secondary | ICD-10-CM

## 2021-02-04 NOTE — Patient Instructions (Signed)
It was good to see you again today!  Congratulations on your weight loss!  I would continue on your present course with slow steady weight loss and exercise, with a goal of losing another 10 pounds in the next 12 months  Continue using Pepcid complete daily as needed  Unless something comes up, we will plan to see you back in 12 months

## 2021-02-04 NOTE — Progress Notes (Signed)
Primary Care Physician:  Lindell Spar, MD Primary Gastroenterologist:  Dr. Gala Romney  Pre-Procedure History & Physical: HPI:  Jennifer [name] is a 56 y.o. female here for follow-up of GERD, bulimia nervosa. She is doing well today as she reports.  She is walking 30 minutes every day.  She is lost 7 pounds since her last visit here.  Repeat LFTs completely normal.  Hepatic ultrasound with steatosis only.  Reflux well controlled on as needed use of Pepcid Complete.  No dysphagia. Denies binging episodes.  Bowel function is good. Note a history of mildly elevated blood sugars.  Past Medical History:  Diagnosis Date   Abdominal pain, epigastric 10/07/2018   Acute non-recurrent maxillary sinusitis 11/07/2019   Anxiety    Arthritis    Binge-eating and purging type anorexia nervosa    Bipolar affective disorder ([place]) 12/04/2015   Bipolar affective disorder, current episode manic without psychotic symptoms ([place]) 12/04/2015   Bipolar disorder ([place])    Cellulitis and abscess of buttock 02/15/2013   Cystitis, interstitial    Depression    Depression    Phreesia 01/28/2020   Esophageal polyp    about 20 years ago   GERD (gastroesophageal reflux disease)    Left wrist pain 03/22/2019   OCD (obsessive compulsive disorder)    Palpitations 05/25/2014   Rectal pain 12/06/2018   Rib pain on right side 01/31/2019   Rosacea 03/27/2019   Sprain of temporomandibular joint or ligament 01/10/2019    Past Surgical History:  Procedure Laterality Date   BLADDER SURGERY     CHOLECYSTECTOMY     COLONOSCOPY WITH PROPOFOL N/A 10/05/2019   Procedure: COLONOSCOPY WITH PROPOFOL;  Surgeon: Daneil Dolin, MD;  [facility];  Service: Endoscopy;  Laterality: N/A;  12:45pm   COSMETIC SURGERY N/A    Phreesia 01/28/2020   ESOPHAGOGASTRODUODENOSCOPY  09/28/2016   [facility]; Dr. Therisa Doyne; erosions in the esophagus, 5 cm hiatal hernia, nonbleeding erosive gastropathy s/p biopsied, normal duodenum.   Path with chronic inactive gastritis, no H. pylori or intestinal metaplasia.    ESOPHAGOGASTRODUODENOSCOPY (EGD) WITH PROPOFOL N/A 10/17/2018   Dr. Gala Romney: mild reflux esophagitis, small hiatal hernia   POLYPECTOMY  10/05/2019   Procedure: POLYPECTOMY;  Surgeon: Daneil Dolin, MD;  Location: [facility];  Service: Endoscopy;;   VOCAL CORD LATERALIZATION, ENDOSCOPIC APPROACH W/ MLB      Prior to Admission medications   Medication Sig Start Date End Date Taking? Authorizing Provider  albuterol (VENTOLIN HFA) 108 (90 Base) MCG/ACT inhaler Inhale 2 puffs into the lungs every 6 (six) hours as needed for wheezing or shortness of breath. 01/06/21  Yes Noreene Larsson, NP  benzonatate (TESSALON) 100 MG capsule Take 1 capsule (100 mg total) by mouth 2 (two) times daily as needed for cough. 01/06/21  Yes Noreene Larsson, NP  betamethasone dipropionate 0.05 % cream Apply topically 2 (two) times daily. 01/21/21  Yes Paseda, Dewaine Conger, FNP  Brexpiprazole ([name]) 4 MG TABS Take 4 mg by mouth in the morning. 12/27/20  Yes Hurst, Dorothea Glassman, PA-C  clonazePAM (KLONOPIN) 0.5 MG tablet Take 1 tablet (0.5 mg total) by mouth 2 (two) times daily as needed for anxiety. 11/21/20  Yes [name] T, PA-C  cromolyn (OPTICROM) 4 % ophthalmic solution Place 1 drop into both eyes 2 (two) times daily. 11/16/20  Yes [provider]  diclofenac Sodium (VOLTAREN) 1 % GEL Apply 4 g topically 4 (four) times daily. 11/19/20  Yes  Lindell Spar, MD  dicyclomine (BENTYL) 20 MG tablet Take 1 tablet (20 mg total) by mouth 3 (three) times daily as needed for spasms (abdominal cramping). 07/18/20  Yes Noreene Larsson, NP  Famotidine-Ca Carb-Mag Hydrox (PEPCID COMPLETE PO) Take 1 tablet by mouth daily.   Yes [provider]  fluvoxaMINE (LUVOX) 100 MG tablet Take 3 tablets (300 mg total) by mouth at bedtime. 05/21/20  Yes Hurst, Helene Kelp T, PA-C  gabapentin (NEURONTIN) 600 MG tablet TAKE TWO TABLETS (1200MG  TOTAL) BY MOUTHTWO  TIMES DAILY Patient taking differently: Take 1,200 mg by mouth 2 (two) times daily. 09/10/20  Yes [name] T, PA-C  hydrOXYzine (ATARAX) 25 MG tablet Take 1-2 tablets (25-50 mg total) by mouth every 8 (eight) hours as needed. 12/27/20  Yes Hurst, Dorothea Glassman, PA-C  hyoscyamine (LEVSIN) 0.125 MG tablet Take 1 tablet (0.125 mg total) by mouth every 4 (four) hours as needed (diarrhea). 05/03/19  Yes Mahala Menghini, PA-C  loratadine (CLARITIN) 10 MG tablet Take 5 mg by mouth daily as needed for allergies.    Yes [provider]  naproxen (NAPROSYN) 500 MG tablet Take 500 mg by mouth 2 (two) times daily as needed for migraine.   Yes [provider]  ondansetron (ZOFRAN-ODT) 4 MG disintegrating tablet Take 1 tablet (4 mg total) by mouth every 8 (eight) hours as needed for nausea or vomiting. 12/31/20  Yes Dohmeier, Asencion Partridge, MD  pramipexole (MIRAPEX) 0.5 MG tablet Take 1 tablet (0.5 mg total) by mouth at bedtime. 12/30/20  Yes Dohmeier, Asencion Partridge, MD  prednisoLONE acetate (PRED FORTE) 1 % ophthalmic suspension Place 1 drop into both eyes in the morning and at bedtime.   Yes [provider]  RESTASIS 0.05 % ophthalmic emulsion 1 drop 2 (two) times daily. 11/11/20  Yes [provider]  Rimegepant Sulfate (NURTEC) 75 MG TBDP Take 75 mg by mouth daily as needed (take for abortive therapy of migraine, no more than 1 tablet in 24 hours or 10 per month). 12/30/20  Yes Dohmeier, Asencion Partridge, MD  sodium chloride (OCEAN NASAL SPRAY) 0.65 % nasal spray Place 1 spray into the nose as needed for congestion. 02/15/20  Yes Lindell Spar, MD  traZODone (DESYREL) 100 MG tablet Take 1-2 tablets (100-200 mg total) by mouth at bedtime as needed for sleep. 12/27/20  Yes Hurst, Dorothea Glassman, PA-C  valACYclovir (VALTREX) 500 MG tablet Take 500 mg by mouth daily as needed (Flair up take Twice a day  for 3 days and stop).  01/03/18  Yes [provider]    Allergies as of 02/04/2021 - Review Complete  02/04/2021  Allergen Reaction Noted   Codeine Shortness Of Breath, Nausea Only, and Rash 02/15/2013   Penicillins Hives, Shortness Of Breath, Swelling, and Rash 10/09/2011   Divalproex sodium Hives, Itching, and Rash 01/28/2020   Meloxicam Other (See Comments) 05/08/2014   Sonata [zaleplon] Other (See Comments) 10/22/2017   Topamax [topiramate] Other (See Comments) 10/04/2018   Aciphex [rabeprazole] Swelling 07/16/2020   Aimovig [erenumab-aooe] Itching 06/26/2019   Galcanezumab-gnlm Rash and Hives 10/04/2018    Family History  Problem Relation Age of Onset   Healthy Mother    Healthy Father    Colon cancer Neg Hx    Colon polyps Neg Hx     Social History   Socioeconomic History   Marital status: Married    Spouse name: [name]   Number of children: 0   Years of education: Not on file   Highest  education level: Not on file  Occupational History   Not on file  Tobacco Use   Smoking status: Every Day    Packs/day: 1.00    Years: 20.00    Pack years: 20.00    Types: Cigarettes    Start date: 01/20/1988   Smokeless tobacco: Never  Vaping Use   Vaping Use: Never used  Substance and Sexual Activity   Alcohol use: No    Alcohol/week: 0.0 standard drinks   Drug use: No   Sexual activity: Yes    Birth control/protection: None  Other Topics Concern   Not on file  Social History Narrative   Lives with husband -[name] of 21 years       Yorkie- Max      Enjoys: reading-all genres      Diet: eats all food groups   Caffeine: 1 cup daily at times, diet dr pepper daily   Water: gatorade  zero and water       Wears seat belt    Does not use phone while driving   Smoke and Development worker, 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         Social Determinants of Radio broadcast assistant Strain: Low Risk    Difficulty of Paying Living Expenses: Not hard at all  Food Insecurity: No Food Insecurity   Worried About Charity fundraiser in the Last Year: Never true    Arboriculturist in the Last Year: Never true  Transportation Needs: No Transportation Needs   Lack of Transportation (Medical): No   Lack of Transportation (Non-Medical): No  Physical Activity: Insufficiently Active   Days of Exercise per Week: 7 days   Minutes of Exercise per Session: 20 min  Stress: Stress Concern Present   Feeling of Stress : To some extent  Social Connections: Moderately Integrated   Frequency of Communication with Friends and Family: More than three times a week   Frequency of Social Gatherings with Friends and Family: Once a week   Attends Religious Services: More than 4 times per year   Active Member of [employer] or Organizations: No   Attends Music therapist: Never   Marital Status: Married  Human resources officer Violence: Unknown   Fear of Current or Ex-Partner: No   Emotionally Abused: No   Physically Abused: No   Sexually Abused: Not on file    Review of Systems: See HPI, otherwise negative ROS  Physical Exam: BP 114/75    Pulse 71    Temp (!) 95.3 F (35.2 C) (Temporal)    Ht 5\' 4"  (1.626 m)    Wt 171 lb 6.4 oz (77.7 kg)    LMP 10/07/2011    BMI 29.42 kg/m  General:   Alert,  Well-developed, well-nourished, pleasant and cooperative in NAD Lungs:  Clear throughout to auscultation.   No wheezes, crackles, or rhonchi. No acute distress. Heart:  Regular rate and rhythm; no murmurs, clicks, rubs,  or gallops. Abdomen: Non-distended, normal bowel sounds.  Soft and nontender without appreciable mass or hepatosplenomegaly.  Pulses:  Normal pulses noted. Extremities:  Without clubbing or edema.  Impression/Plan: Pleasant 56 year old lady with a history of bulimia nervosa, GERD and fatty liver.  Bulimia symptoms appear to be well controlled.  She is seeing therapist on a regular basis (not mentioned above).  Regular exercise has been associated with weight loss.  This is great!  Borderline blood sugars will also be  helped by weight loss.  I would like  to see this nice lady lose another 10 pounds within the next year.  GERD will continue to improve as well.  She is doing well on her current regimen of H2 blocker/AA therapy   Recommendations:  Continue on present course with slow steady weight loss and exercise, with a goal of losing another 10 pounds in the next 12 months  Continue using Pepcid complete daily as needed  Unless something comes up, we will plan to see her back in 12 months   Notice: This dictation was prepared with Dragon dictation along with smaller phrase technology. Any transcriptional errors that result from this process are unintentional and may not be corrected upon review.

## 2021-02-04 NOTE — Telephone Encounter (Signed)
Called pt to schedule her sleep study but pt only wanted to do an HST but insurance would not authorize because of RLS. Pt does not wish to move forward with her study.

## 2021-02-05 DIAGNOSIS — F4310 Post-traumatic stress disorder, unspecified: Secondary | ICD-10-CM | POA: Diagnosis not present

## 2021-02-05 DIAGNOSIS — F429 Obsessive-compulsive disorder, unspecified: Secondary | ICD-10-CM | POA: Diagnosis not present

## 2021-02-05 DIAGNOSIS — F3131 Bipolar disorder, current episode depressed, mild: Secondary | ICD-10-CM | POA: Diagnosis not present

## 2021-02-05 DIAGNOSIS — F502 Bulimia nervosa: Secondary | ICD-10-CM | POA: Diagnosis not present

## 2021-02-12 ENCOUNTER — Ambulatory Visit: Payer: Medicare PPO | Admitting: Neurology

## 2021-02-12 DIAGNOSIS — F429 Obsessive-compulsive disorder, unspecified: Secondary | ICD-10-CM | POA: Diagnosis not present

## 2021-02-12 DIAGNOSIS — F502 Bulimia nervosa: Secondary | ICD-10-CM | POA: Diagnosis not present

## 2021-02-12 DIAGNOSIS — F3131 Bipolar disorder, current episode depressed, mild: Secondary | ICD-10-CM | POA: Diagnosis not present

## 2021-02-12 DIAGNOSIS — F4310 Post-traumatic stress disorder, unspecified: Secondary | ICD-10-CM | POA: Diagnosis not present

## 2021-02-19 DIAGNOSIS — F502 Bulimia nervosa: Secondary | ICD-10-CM | POA: Diagnosis not present

## 2021-02-19 DIAGNOSIS — F3131 Bipolar disorder, current episode depressed, mild: Secondary | ICD-10-CM | POA: Diagnosis not present

## 2021-02-19 DIAGNOSIS — F429 Obsessive-compulsive disorder, unspecified: Secondary | ICD-10-CM | POA: Diagnosis not present

## 2021-02-19 DIAGNOSIS — F4310 Post-traumatic stress disorder, unspecified: Secondary | ICD-10-CM | POA: Diagnosis not present

## 2021-02-26 DIAGNOSIS — F3131 Bipolar disorder, current episode depressed, mild: Secondary | ICD-10-CM | POA: Diagnosis not present

## 2021-02-26 DIAGNOSIS — F4310 Post-traumatic stress disorder, unspecified: Secondary | ICD-10-CM | POA: Diagnosis not present

## 2021-02-26 DIAGNOSIS — F502 Bulimia nervosa: Secondary | ICD-10-CM | POA: Diagnosis not present

## 2021-02-26 DIAGNOSIS — F429 Obsessive-compulsive disorder, unspecified: Secondary | ICD-10-CM | POA: Diagnosis not present

## 2021-02-28 ENCOUNTER — Encounter: Payer: Self-pay | Admitting: Nurse Practitioner

## 2021-02-28 ENCOUNTER — Other Ambulatory Visit: Payer: Self-pay

## 2021-02-28 ENCOUNTER — Ambulatory Visit (INDEPENDENT_AMBULATORY_CARE_PROVIDER_SITE_OTHER): Payer: Medicare PPO | Admitting: Nurse Practitioner

## 2021-02-28 DIAGNOSIS — J309 Allergic rhinitis, unspecified: Secondary | ICD-10-CM | POA: Insufficient documentation

## 2021-02-28 DIAGNOSIS — H9201 Otalgia, right ear: Secondary | ICD-10-CM

## 2021-02-28 MED ORDER — FLUTICASONE PROPIONATE 50 MCG/ACT NA SUSP: 2.0000 | Freq: Every day | NASAL | 6 refills | Status: DC

## 2021-02-28 NOTE — Assessment & Plan Note (Addendum)
Allergies. Continue Claritin 10mg  daily                      Flonase nasal spray                     Quit smoking

## 2021-02-28 NOTE — Progress Notes (Signed)
Virtual Visit via Telephone Note  I connected with Jennifer [name] @ on 02/28/21 at 3;15pm by telephone and verified that I am speaking with the correct person using two identifiers. I spent 6 minutes talking to the pt and reviewing her chart   Location: Patient: home Provider: office   I discussed the limitations, risks, security and privacy concerns of performing an evaluation and management service by telephone and the availability of in person appointments. I also discussed with the patient that there may be a patient responsible charge related to this service. The patient expressed understanding and agreed to proceed.   History of Present Illness: Pt c/o runny nose, sneezing,scratchy throat that started 2 weeks ago.  she took some Claritin today and it has helped a whole lot , she has used Flonase in the past and it helped too. Pt stated that she has right ear pain, started hurting today. She denies any drainage from her ear, denies cough.  Covid test at home was negative. Current smoker plans to quit tomorrow for her wedding anniversary. , has wheezing sometimes, denies fever, chills, CP, malaise , bloody sputum.    Observations/Objective: Allergic rhinitis  Continue Claritin 10mg  daily                      Flonase nasal spray Right ear pain. Take tylenol as needed.   Assessment and Plan:   Follow Up Instructions:    I discussed the assessment and treatment plan with the patient. The patient was provided an opportunity to ask questions and all were answered. The patient agreed with the plan and demonstrated an understanding of the instructions.   The patient was advised to call back or seek an in-person evaluation if the symptoms worsen or if the condition fails to improve as anticipated.

## 2021-02-28 NOTE — Assessment & Plan Note (Signed)
Take tylenol as needed.  

## 2021-03-04 ENCOUNTER — Ambulatory Visit: Payer: Medicare PPO | Admitting: Cardiovascular Disease

## 2021-03-04 ENCOUNTER — Other Ambulatory Visit: Payer: Self-pay

## 2021-03-04 ENCOUNTER — Encounter: Payer: Self-pay | Admitting: Cardiovascular Disease

## 2021-03-04 DIAGNOSIS — I739 Peripheral vascular disease, unspecified: Secondary | ICD-10-CM | POA: Diagnosis not present

## 2021-03-04 NOTE — Assessment & Plan Note (Signed)
Jennifer [name] was referred to me by Dr. Brett Fairy for work-up of PAD.  She was complaining of burning in her feet but really no claudication.  She apparently had a negative work-up for peripheral neuropathy.  She has 2+ pedal pulses and I doubt her symptoms are related to PAD.  No further work-up is necessary at this time.

## 2021-03-04 NOTE — Patient Instructions (Signed)

## 2021-03-04 NOTE — Progress Notes (Signed)
03/04/2021 Jennifer [name]   [birth year]  [id number]  Primary Physician Lindell Spar, MD Primary Cardiologist: Lorretta Harp MD FACP, [place], [place], [place]  HPI:  Jennifer [name] is a 56 y.o. moderately overweight married Caucasian female with no children who is on disability because of bipolar disorder and OCD.  She previously was seen by Dr. Darden Amber for palpitations.  She has a 40-pack-year history of tobacco abuse recently decreased from 1 pack a day to one third of a pack a day, trying to stop.  She has no other cardiac risk factors.  She is never had heart attack or stroke.  She denies chest pain or shortness of breath.  She was evaluated for burning in her feet by Dr. Brett Fairy who ruled out peripheral neuropathy.   Current Meds  Medication Sig   Brexpiprazole ([name]) 4 MG TABS Take 4 mg by mouth in the morning.   cromolyn (OPTICROM) 4 % ophthalmic solution Place 1 drop into both eyes 2 (two) times daily.   Famotidine-Ca Carb-Mag Hydrox (PEPCID COMPLETE PO) Take 1 tablet by mouth daily.   fluticasone (FLONASE) 50 MCG/ACT nasal spray Place 2 sprays into both nostrils daily.   fluvoxaMINE (LUVOX) 100 MG tablet Take 3 tablets (300 mg total) by mouth at bedtime.   gabapentin (NEURONTIN) 600 MG tablet TAKE TWO TABLETS (1200MG  TOTAL) BY MOUTHTWO TIMES DAILY (Patient taking differently: Take 1,200 mg by mouth 2 (two) times daily.)   loratadine (CLARITIN) 10 MG tablet Take 5 mg by mouth daily as needed for allergies.    naproxen (NAPROSYN) 500 MG tablet Take 500 mg by mouth 2 (two) times daily as needed for migraine.   ondansetron (ZOFRAN-ODT) 4 MG disintegrating tablet Take 1 tablet (4 mg total) by mouth every 8 (eight) hours as needed for nausea or vomiting.   pramipexole (MIRAPEX) 0.5 MG tablet Take 1 tablet (0.5 mg total) by mouth at bedtime.   prednisoLONE acetate (PRED FORTE) 1 % ophthalmic suspension Place 1 drop into both eyes in the morning and at bedtime.    RESTASIS 0.05 % ophthalmic emulsion 1 drop 2 (two) times daily.   traZODone (DESYREL) 100 MG tablet Take 1-2 tablets (100-200 mg total) by mouth at bedtime as needed for sleep.   valACYclovir (VALTREX) 500 MG tablet Take 500 mg by mouth daily as needed (Flair up take Twice a day  for 3 days and stop).      Allergies  Allergen Reactions   Codeine Shortness Of Breath, Nausea Only and Rash   Penicillins Hives, Shortness Of Breath, Swelling and Rash    Has patient had a PCN reaction causing immediate rash, facial/tongue/throat swelling, SOB or lightheadedness with hypotension: yes Has patient had a PCN reaction causing severe rash involving mucus membranes or skin necrosis: no Has patient had a PCN reaction that required hospitalization: no Has patient had a PCN reaction occurring within the last 10 years: no If all of the above answers are "NO", then may proceed with Cephalosporin use.;      Divalproex Sodium Hives, Itching and Rash   Meloxicam Other (See Comments)    Possible chest tightness - instructed by MD not to take   Sonata [Zaleplon] Other (See Comments)    Hallucinations   Topamax [Topiramate] Other (See Comments)    Low BP and dizziness   Aciphex [Rabeprazole] Swelling   Aimovig [Erenumab-Aooe] Itching   Galcanezumab-Gnlm Rash and Hives    Social History   Socioeconomic History  Marital status: Married    Spouse name: [name]   Number of children: 0   Years of education: Not on file   Highest education level: Not on file  Occupational History   Not on file  Tobacco Use   Smoking status: Every Day    Packs/day: 1.00    Years: 20.00    Pack years: 20.00    Types: Cigarettes    Start date: 01/20/1988   Smokeless tobacco: Never  Vaping Use   Vaping Use: Never used  Substance and Sexual Activity   Alcohol use: No    Alcohol/week: 0.0 standard drinks   Drug use: No   Sexual activity: Yes    Birth control/protection: None  Other Topics Concern   Not on file   Social History Narrative   Lives with husband -[name] of 21 years       Yorkie- Max      Enjoys: reading-all genres      Diet: eats all food groups   Caffeine: 1 cup daily at times, diet dr pepper daily   Water: gatorade  zero and water       Wears seat belt    Does not use phone while driving   Smoke and Development worker, 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         Social Determinants of Radio broadcast assistant Strain: Low Risk    Difficulty of Paying Living Expenses: Not hard at all  Food Insecurity: No Food Insecurity   Worried About Charity fundraiser in the Last Year: Never true   Arboriculturist in the Last Year: Never true  Transportation Needs: No Transportation Needs   Lack of Transportation (Medical): No   Lack of Transportation (Non-Medical): No  Physical Activity: Insufficiently Active   Days of Exercise per Week: 7 days   Minutes of Exercise per Session: 20 min  Stress: Stress Concern Present   Feeling of Stress : To some extent  Social Connections: Moderately Integrated   Frequency of Communication with Friends and Family: More than three times a week   Frequency of Social Gatherings with Friends and Family: Once a week   Attends Religious Services: More than 4 times per year   Active Member of [employer] or Organizations: No   Attends Archivist Meetings: Never   Marital Status: Married  Human resources officer Violence: Unknown   Fear of Current or Ex-Partner: No   Emotionally Abused: No   Physically Abused: No   Sexually Abused: Not on file     Review of Systems: General: negative for chills, fever, night sweats or weight changes.  Cardiovascular: negative for chest pain, dyspnea on exertion, edema, orthopnea, palpitations, paroxysmal nocturnal dyspnea or shortness of breath Dermatological: negative for rash Respiratory: negative for cough or wheezing Urologic: negative for hematuria Abdominal: negative for nausea, vomiting,  diarrhea, bright red blood per rectum, melena, or hematemesis Neurologic: negative for visual changes, syncope, or dizziness All other systems reviewed and are otherwise negative except as noted above.    Blood pressure 124/86, pulse 66, height 5\' 4"  (1.626 m), weight 173 lb 6.4 oz (78.7 kg), last menstrual period 10/07/2011, SpO2 97 %.  General appearance: alert and no distress Neck: no adenopathy, no carotid bruit, no JVD, supple, symmetrical, trachea midline, and thyroid not enlarged, symmetric, no tenderness/mass/nodules Lungs: clear to auscultation bilaterally Heart: regular rate and rhythm, S1, S2 normal, no murmur, click, rub or gallop  Extremities: extremities normal, atraumatic, no cyanosis or edema Pulses: 2+ and symmetric Skin: Skin color, texture, turgor normal. No rashes or lesions Neurologic: Grossly normal  EKG sinus rhythm at 66 without ST or T wave changes.  Personally reviewed this EKG.  ASSESSMENT AND PLAN:   PVD (peripheral vascular disease) ([place]) Ms. Ellington was referred to me by Dr. Brett Fairy for work-up of PAD.  She was complaining of burning in her feet but really no claudication.  She apparently had a negative work-up for peripheral neuropathy.  She has 2+ pedal pulses and I doubt her symptoms are related to PAD.  No further work-up is necessary at this time.     Lorretta Harp MD FACP,FACC,FAHA, [facility] 03/04/2021 11:17 AM

## 2021-03-06 ENCOUNTER — Encounter: Payer: Self-pay | Admitting: Physician Assistant

## 2021-03-06 ENCOUNTER — Telehealth (INDEPENDENT_AMBULATORY_CARE_PROVIDER_SITE_OTHER): Payer: Medicare PPO | Admitting: Physician Assistant

## 2021-03-06 DIAGNOSIS — F502 Bulimia nervosa, unspecified: Secondary | ICD-10-CM

## 2021-03-06 DIAGNOSIS — F3130 Bipolar disorder, current episode depressed, mild or moderate severity, unspecified: Secondary | ICD-10-CM | POA: Diagnosis not present

## 2021-03-06 DIAGNOSIS — F17200 Nicotine dependence, unspecified, uncomplicated: Secondary | ICD-10-CM | POA: Diagnosis not present

## 2021-03-06 DIAGNOSIS — F429 Obsessive-compulsive disorder, unspecified: Secondary | ICD-10-CM

## 2021-03-06 DIAGNOSIS — G4700 Insomnia, unspecified: Secondary | ICD-10-CM

## 2021-03-06 DIAGNOSIS — F411 Generalized anxiety disorder: Secondary | ICD-10-CM

## 2021-03-06 DIAGNOSIS — T50905A Adverse effect of unspecified drugs, medicaments and biological substances, initial encounter: Secondary | ICD-10-CM

## 2021-03-06 DIAGNOSIS — G2571 Drug induced akathisia: Secondary | ICD-10-CM

## 2021-03-06 MED ORDER — GABAPENTIN 600 MG PO TABS: ORAL_TABLET | ORAL | 5 refills | Status: DC

## 2021-03-06 MED ORDER — CLONAZEPAM 0.5 MG PO TABS: 0.5000 mg | ORAL_TABLET | Freq: Three times a day (TID) | ORAL | 5 refills | Status: DC | PRN

## 2021-03-06 NOTE — Progress Notes (Signed)
[facility]  Patient ID: HARLEI LEHRMANN,  MRN: [id number]  PCP: Lindell Spar, MD  Date of Evaluation: 03/06/2021 Time spent:40 minutes  Chief Complaint:  Chief Complaint   Anxiety; Depression; Follow-up     Virtual Visit via Telehealth  I connected with patient by a video enabled telemedicine application with their informed consent, and verified patient privacy and that I am speaking with the correct person using two identifiers.  I am private, in my office and the patient is at home.  I discussed the limitations, risks, security and privacy concerns of performing an evaluation and management service by video and the availability of in person appointments. I also discussed with the patient that there may be a patient responsible charge related to this service. The patient expressed understanding and agreed to proceed.   I discussed the assessment and treatment plan with the patient. The patient was provided an opportunity to ask questions and all were answered. The patient agreed with the plan and demonstrated an understanding of the instructions.   The patient was advised to call back or seek an in-person evaluation if the symptoms worsen or if the condition fails to improve as anticipated.  I provided 40 minutes of non-face-to-face time during this encounter.   HISTORY/CURRENT STATUS: HPI For routine med check.  For past 5 days-feeling jittery inside, like she has butterflies all inside her.  She cannot stand to sit down for very long so she will get up and then pace a while and then sit back down because she cannot stand doing that either.  Until that problem started, she was doing pretty good. Mood was fairly level w/o big swings, had been able to enjoy things.  Energy and motivation are good.  She had been sleeping pretty well.  Not isolating.  ADLs and personal hygiene are normal.  Anxiety has been well controlled until the past 5 days.  She occasionally takes  Klonopin but not very often.  Does take the hydroxyzine when needed.  No suicidal or homicidal thoughts.  Will probably be having review of disability soon. Got a letter about it but not exactly sure what all it entails.   Patient denies increased energy with decreased need for sleep, no increased talkativeness, no racing thoughts, no impulsivity or risky behaviors, no increased spending, no increased libido, no grandiosity, no increased irritability or anger, no paranoia, and no hallucinations.  Denies dizziness, syncope, seizures, numbness, tingling, tremor, tics, unsteady gait, slurred speech, confusion.  Has chronic joint pain.  No binging or purging in 'awhile.' Doesn't elaborate.  Does have chronic GERD but meds are helping.  Individual Medical History/ Review of Systems: Changes? :Yes   RLS and started on pramipexole by neurology.     Past medications for mental health diagnoses include: Risperdal, Seroquel, Prozac, Zoloft,  Wellbutrin, Lamictal, Depakote caused hair loss, Xanax, Ambien, trazodone, Trileptal, Luvox, Topamax, Elavil, Pamelor, BuSpar, doxazosin, prazosin, lithium, Vraylar, Abilify, [name], Latuda >60 mg caused abd pain and nausea  Allergies: Codeine, Penicillins, Divalproex sodium, Meloxicam, Sonata [zaleplon], Topamax [topiramate], Aciphex [rabeprazole], Aimovig [erenumab-aooe], and Galcanezumab-gnlm  Current Medications:  Current Outpatient Medications:    betamethasone dipropionate 0.05 % cream, Apply topically 2 (two) times daily., Disp: 30 g, Rfl: 0   Brexpiprazole ([name]) 4 MG TABS, Take 4 mg by mouth in the morning., Disp: 30 tablet, Rfl: 11   cromolyn (OPTICROM) 4 % ophthalmic solution, Place 1 drop into both eyes 2 (two) times daily., Disp: , Rfl:  diclofenac Sodium (VOLTAREN) 1 % GEL, Apply 4 g topically 4 (four) times daily., Disp: 50 g, Rfl: 0   dicyclomine (BENTYL) 20 MG tablet, Take 1 tablet (20 mg total) by mouth 3 (three) times daily as needed for  spasms (abdominal cramping)., Disp: 20 tablet, Rfl: 0   Famotidine-Ca Carb-Mag Hydrox (PEPCID COMPLETE PO), Take 1 tablet by mouth daily., Disp: , Rfl:    fluticasone (FLONASE) 50 MCG/ACT nasal spray, Place 2 sprays into both nostrils daily., Disp: 16 g, Rfl: 6   fluvoxaMINE (LUVOX) 100 MG tablet, Take 3 tablets (300 mg total) by mouth at bedtime., Disp: 270 tablet, Rfl: 3   hydrOXYzine (ATARAX) 25 MG tablet, Take 1-2 tablets (25-50 mg total) by mouth every 8 (eight) hours as needed., Disp: 90 tablet, Rfl: 11   loratadine (CLARITIN) 10 MG tablet, Take 5 mg by mouth daily as needed for allergies. , Disp: , Rfl:    naproxen (NAPROSYN) 500 MG tablet, Take 500 mg by mouth 2 (two) times daily as needed for migraine., Disp: , Rfl:    ondansetron (ZOFRAN-ODT) 4 MG disintegrating tablet, Take 1 tablet (4 mg total) by mouth every 8 (eight) hours as needed for nausea or vomiting., Disp: 30 tablet, Rfl: 1   pramipexole (MIRAPEX) 0.5 MG tablet, Take 1 tablet (0.5 mg total) by mouth at bedtime., Disp: 90 tablet, Rfl: 3   prednisoLONE acetate (PRED FORTE) 1 % ophthalmic suspension, Place 1 drop into both eyes in the morning and at bedtime., Disp: , Rfl:    RESTASIS 0.05 % ophthalmic emulsion, 1 drop 2 (two) times daily., Disp: , Rfl:    Rimegepant Sulfate (NURTEC) 75 MG TBDP, Take 75 mg by mouth daily as needed (take for abortive therapy of migraine, no more than 1 tablet in 24 hours or 10 per month)., Disp: 8 tablet, Rfl: 11   traZODone (DESYREL) 100 MG tablet, Take 1-2 tablets (100-200 mg total) by mouth at bedtime as needed for sleep., Disp: 60 tablet, Rfl: 11   valACYclovir (VALTREX) 500 MG tablet, Take 500 mg by mouth daily as needed (Flair up take Twice a day  for 3 days and stop). , Disp: , Rfl:    albuterol (VENTOLIN HFA) 108 (90 Base) MCG/ACT inhaler, Inhale 2 puffs into the lungs every 6 (six) hours as needed for wheezing or shortness of breath. (Patient not taking: Reported on 03/04/2021), Disp: 8 g, Rfl:  0   clonazePAM (KLONOPIN) 0.5 MG tablet, Take 1 tablet (0.5 mg total) by mouth 3 (three) times daily as needed for anxiety., Disp: 30 tablet, Rfl: 5   gabapentin (NEURONTIN) 600 MG tablet, 2 p.o. every morning, 1 p.o. q. afternoon, 2 p.o. nightly., Disp: 150 tablet, Rfl: 5   hyoscyamine (LEVSIN) 0.125 MG tablet, Take 1 tablet (0.125 mg total) by mouth every 4 (four) hours as needed (diarrhea). (Patient not taking: Reported on 03/04/2021), Disp: 120 tablet, Rfl: 1   propranolol (INDERAL) 20 MG tablet, Take 1 tablet (20 mg total) by mouth 2 (two) times daily. (Patient taking differently: Take 10 mg by mouth 2 (two) times daily.), Disp: 60 tablet, Rfl: 1   sodium chloride (OCEAN NASAL SPRAY) 0.65 % nasal spray, Place 1 spray into the nose as needed for congestion. (Patient not taking: Reported on 03/06/2021), Disp: 30 mL, Rfl: 12 Medication Side Effects: none  Family Medical/ Social History: Changes?  No  MENTAL HEALTH EXAM:  Last menstrual period 10/07/2011.There is no height or weight on file to calculate BMI.  General  Appearance: Casual and Well Groomed  Eye Contact:  Good  Speech:  Clear and Coherent and Normal Rate  Volume:  Normal  Mood:  Anxious  Affect:  Anxious  Thought Process:  Goal Directed and Descriptions of Associations: Intact  Orientation:  Full (Time, Place, and Person)  Thought Content: Logical   Suicidal Thoughts:  No  Homicidal Thoughts:  No  Memory:  WNL  Judgement:  Good  Insight:  Good  Psychomotor Activity:  Normal  Concentration:  Concentration: Good and Attention Span: Good  Recall:  Good  Fund of Knowledge: Good  Language: Good  Assets:  Desire for Improvement  ADL's:  Intact  Cognition: WNL  Prognosis:  Good    DIAGNOSES:    ICD-10-CM   1. Acute medication-induced akathisia  G25.71    T50.905A     2. Bipolar I disorder, most recent episode depressed ([place])  F31.30     3. Obsessive-compulsive disorder, unspecified type  F42.9     4. Insomnia,  unspecified type  G47.00     5. Bulimia  F50.2     6. Generalized anxiety disorder  F41.1     7. Smoker  F17.200       Receiving Psychotherapy: Yes [facility]   RECOMMENDATIONS:  PDMP was reviewed.  Last Klonopin filled 01/14/2021. I provided 30  minutes of non-face-to-face time during this encounter, including time spent before and after the visit in records review, medical decision making, counseling pertinent to today's visit, and charting.  Discussed dx of akathisia, causes and treatment. Increasing BZ can be helpful, adding propranolol but I'd prefer not to right now since I can't check BP, adding clonidine, increasing Gabapentin, or decreasing [name]. We agree to increasing Klonopin and Gabapentin. If not effective in a few days, call and will discuss other options.  For the possibility of disability review, she is still unable to go back to work. The mental illness and the unpredictability of mood changes, there is no way Olina can function in a job. Also chronic migraines, chronic bladder problems, and GI issues keep her from working.   Continue [name] 4 mg, 1 p.o. daily. Increase Klonopin 0.5 mg, 1 p.o. twice daily routinely for 4-5 days, then back to prn.  Continue Luvox 100 mg, 3 p.o. nightly. Increase Gabapentin 600 mg, 2 qam., 1 in afternoon, 2 po qhs. Continue hydroxyzine 25-50 mg, 1 p.o. 3 times daily as needed.  Try taking it at bedtime along with the trazodone to see if that will help with racing thoughts and keep her asleep. Continue pramipexole 0.5 mg nightly.  Per another provider Continue trazodone 100 mg, 1-2 nightly as needed sleep. Continue therapy. Return in 4 wks.  [name], PA-C

## 2021-03-07 ENCOUNTER — Telehealth: Payer: Self-pay | Admitting: Physician Assistant

## 2021-03-07 DIAGNOSIS — F4310 Post-traumatic stress disorder, unspecified: Secondary | ICD-10-CM | POA: Diagnosis not present

## 2021-03-07 DIAGNOSIS — F502 Bulimia nervosa: Secondary | ICD-10-CM | POA: Diagnosis not present

## 2021-03-07 DIAGNOSIS — F429 Obsessive-compulsive disorder, unspecified: Secondary | ICD-10-CM | POA: Diagnosis not present

## 2021-03-07 DIAGNOSIS — F3131 Bipolar disorder, current episode depressed, mild: Secondary | ICD-10-CM | POA: Diagnosis not present

## 2021-03-07 MED ORDER — PROPRANOLOL HCL 20 MG PO TABS: 20.0000 mg | ORAL_TABLET | Freq: Two times a day (BID) | ORAL | 1 refills | Status: DC

## 2021-03-07 NOTE — Telephone Encounter (Signed)
Patient called in with regards to her current medication. States that after appt on 2/16 TH upped her Gabapentin. She states also that she is taking Klonopin 2x a day and this is to much for her. She states she is fine after taking the first Klonopin but when she takes the second one she feels down. She also still has the shakes. It was discussed at appt to start taking Proponyl I believe she said if she was having issues with the change in medication. She would like to start taking this now. Pls rtc to discuss [phone]

## 2021-03-07 NOTE — Telephone Encounter (Signed)
Pt called after hours, pharmacy where I sent propranolol to earlier unable to get Rx, internet service down. I called, and sent Rx to [facility] in [place]. Pt aware.

## 2021-03-07 NOTE — Telephone Encounter (Signed)
I spoke with Jennifer [name] and we agreed to start propranolol.  She does have access to a BP cuff and can check her blood pressure.  She knows to contact the on-call service if she has blood pressure below 110/70 accompanied by dizziness.  Orthostatic hypotension prevention measures discussed.  Another option would be clonidine if propranolol does not help, and decreasing [name] to 3 mg may be helpful as well.  She has done so well at the 4 mg neither of [place] want to decrease the dose if not absolutely necessary. Start propranolol 4 mg, 1 p.o. twice daily she is aware this is a low ballpark dose but I do not want to have the dose too high without having a baseline BP and pulse. Prescription sent to [facility].

## 2021-03-07 NOTE — Telephone Encounter (Signed)
It has only been a day since the dose change.Pt is requesting propranolol please advise

## 2021-03-09 ENCOUNTER — Telehealth: Payer: Self-pay | Admitting: Physician Assistant

## 2021-03-09 NOTE — Telephone Encounter (Signed)
She called yesterday, was feeling a little dizzy, very tired, and BP was 109/61, after having propranolol the night before and that morning. Went for a walk and had to stop a few times, felt light headed. Advised to take 1/2 Propranolol (=10 mg) last night and again this morning.  Sx of akathisia were already improving. I called to check on her this afternoon, and she has felt slightly lightheaded, but not as bad and BP 110/60s-120/70s, with pulse 60-70 each time she's checked.  Akathisia better. Only problem today is rubbing her hands on her thighs or wringing her hands. She does these things anyway, but were a little worse at lunch. If it recurs and is worse, she can take another 10 mg in mid day.

## 2021-03-12 DIAGNOSIS — F4310 Post-traumatic stress disorder, unspecified: Secondary | ICD-10-CM | POA: Diagnosis not present

## 2021-03-12 DIAGNOSIS — F429 Obsessive-compulsive disorder, unspecified: Secondary | ICD-10-CM | POA: Diagnosis not present

## 2021-03-12 DIAGNOSIS — F502 Bulimia nervosa: Secondary | ICD-10-CM | POA: Diagnosis not present

## 2021-03-12 DIAGNOSIS — F3131 Bipolar disorder, current episode depressed, mild: Secondary | ICD-10-CM | POA: Diagnosis not present

## 2021-03-19 ENCOUNTER — Ambulatory Visit (INDEPENDENT_AMBULATORY_CARE_PROVIDER_SITE_OTHER): Payer: Medicare PPO

## 2021-03-19 ENCOUNTER — Other Ambulatory Visit: Payer: Self-pay

## 2021-03-19 ENCOUNTER — Ambulatory Visit (INDEPENDENT_AMBULATORY_CARE_PROVIDER_SITE_OTHER): Payer: Medicare PPO | Admitting: Internal Medicine

## 2021-03-19 ENCOUNTER — Encounter: Payer: Self-pay | Admitting: Internal Medicine

## 2021-03-19 DIAGNOSIS — B370 Candidal stomatitis: Secondary | ICD-10-CM | POA: Diagnosis not present

## 2021-03-19 DIAGNOSIS — J40 Bronchitis, not specified as acute or chronic: Secondary | ICD-10-CM

## 2021-03-19 DIAGNOSIS — J069 Acute upper respiratory infection, unspecified: Secondary | ICD-10-CM

## 2021-03-19 LAB — POCT INFLUENZA A/B
Influenza A, POC: NEGATIVE
Influenza B, POC: NEGATIVE

## 2021-03-19 MED ORDER — BENZONATATE 100 MG PO CAPS: 100.0000 mg | ORAL_CAPSULE | Freq: Two times a day (BID) | ORAL | 0 refills | Status: DC | PRN

## 2021-03-19 MED ORDER — NYSTATIN 100000 UNIT/ML MT SUSP: 5.0000 mL | Freq: Four times a day (QID) | OROMUCOSAL | 0 refills | Status: DC

## 2021-03-19 NOTE — Progress Notes (Signed)
Virtual Visit via Telephone Note   This visit type was conducted due to national recommendations for restrictions regarding the COVID-19 Pandemic (e.g. social distancing) in an effort to limit this patient's exposure and mitigate transmission in our community.  Due to her co-morbid illnesses, this patient is at least at moderate risk for complications without adequate follow up.  This format is felt to be most appropriate for this patient at this time.  The patient did not have access to video technology/had technical difficulties with video requiring transitioning to audio format only (telephone).  All issues noted in this document were discussed and addressed.  No physical exam could be performed with this format.  Evaluation Performed:  Follow-up visit  Date:  03/19/2021   ID:  Jennifer, [name] Jun 14, 1965, MRN [id number]  Patient Location: Home Provider Location: Office/Clinic  Participants: Patient Location of Patient: Home Location of Provider: Telehealth Consent was obtain for visit to be over via telehealth. I verified that I am speaking with the correct person using two identifiers.  PCP:  Lindell Spar, MD   Chief Complaint: Sore throat and cough  History of Present Illness:    Jennifer [name] is a 56 y.o. female who has a televisit for complaint of nasal congestion, cough and sore throat since yesterday.  She denies any fever, chills, dyspnea or wheezing currently.  Her husband also has similar symptoms.  She also complains of whitish patches on her tongue and has a history of oral thrush in the past as well.  The patient does have symptoms concerning for COVID-19 infection (fever, chills, cough, or new shortness of breath).   Past Medical, Surgical, Social History, Allergies, and Medications have been Reviewed.  Past Medical History:  Diagnosis Date   Abdominal pain, epigastric 10/07/2018   Acute non-recurrent maxillary sinusitis 11/07/2019   Anxiety    Arthritis     Binge-eating and purging type anorexia nervosa    Bipolar affective disorder ([place]) 12/04/2015   Bipolar affective disorder, current episode manic without psychotic symptoms ([place]) 12/04/2015   Bipolar disorder ([place])    Cellulitis and abscess of buttock 02/15/2013   Cystitis, interstitial    Depression    Depression    Phreesia 01/28/2020   Esophageal polyp    about 20 years ago   GERD (gastroesophageal reflux disease)    Left wrist pain 03/22/2019   OCD (obsessive compulsive disorder)    Palpitations 05/25/2014   Rectal pain 12/06/2018   Rib pain on right side 01/31/2019   Rosacea 03/27/2019   Sprain of temporomandibular joint or ligament 01/10/2019   Past Surgical History:  Procedure Laterality Date   BLADDER SURGERY     CHOLECYSTECTOMY     COLONOSCOPY WITH PROPOFOL N/A 10/05/2019   Procedure: COLONOSCOPY WITH PROPOFOL;  Surgeon: Daneil Dolin, MD;  [facility];  Service: Endoscopy;  Laterality: N/A;  12:45pm   COSMETIC SURGERY N/A    Phreesia 01/28/2020   ESOPHAGOGASTRODUODENOSCOPY  09/28/2016   [facility]; Dr. Therisa Doyne; erosions in the esophagus, 5 cm hiatal hernia, nonbleeding erosive gastropathy s/p biopsied, normal duodenum.  Path with chronic inactive gastritis, no H. pylori or intestinal metaplasia.    ESOPHAGOGASTRODUODENOSCOPY (EGD) WITH PROPOFOL N/A 10/17/2018   Dr. Gala Romney: mild reflux esophagitis, small hiatal hernia   POLYPECTOMY  10/05/2019   Procedure: POLYPECTOMY;  Surgeon: Daneil Dolin, MD;  Location: [facility];  Service: Endoscopy;;   VOCAL CORD LATERALIZATION, ENDOSCOPIC APPROACH W/ MLB  Current Meds  Medication Sig   betamethasone dipropionate 0.05 % cream Apply topically 2 (two) times daily.   Brexpiprazole ([name]) 4 MG TABS Take 4 mg by mouth in the morning.   clonazePAM (KLONOPIN) 0.5 MG tablet Take 1 tablet (0.5 mg total) by mouth 3 (three) times daily as needed for anxiety.   cromolyn (OPTICROM) 4 % ophthalmic solution Place 1  drop into both eyes 2 (two) times daily.   diclofenac Sodium (VOLTAREN) 1 % GEL Apply 4 g topically 4 (four) times daily.   dicyclomine (BENTYL) 20 MG tablet Take 1 tablet (20 mg total) by mouth 3 (three) times daily as needed for spasms (abdominal cramping).   Famotidine-Ca Carb-Mag Hydrox (PEPCID COMPLETE PO) Take 1 tablet by mouth daily.   fluticasone (FLONASE) 50 MCG/ACT nasal spray Place 2 sprays into both nostrils daily.   fluvoxaMINE (LUVOX) 100 MG tablet Take 3 tablets (300 mg total) by mouth at bedtime.   gabapentin (NEURONTIN) 600 MG tablet 2 p.o. every morning, 1 p.o. q. afternoon, 2 p.o. nightly.   hydrOXYzine (ATARAX) 25 MG tablet Take 1-2 tablets (25-50 mg total) by mouth every 8 (eight) hours as needed.   hyoscyamine (LEVSIN) 0.125 MG tablet Take 1 tablet (0.125 mg total) by mouth every 4 (four) hours as needed (diarrhea).   loratadine (CLARITIN) 10 MG tablet Take 5 mg by mouth daily as needed for allergies.    naproxen (NAPROSYN) 500 MG tablet Take 500 mg by mouth 2 (two) times daily as needed for migraine.   ondansetron (ZOFRAN-ODT) 4 MG disintegrating tablet Take 1 tablet (4 mg total) by mouth every 8 (eight) hours as needed for nausea or vomiting.   pramipexole (MIRAPEX) 0.5 MG tablet Take 1 tablet (0.5 mg total) by mouth at bedtime.   prednisoLONE acetate (PRED FORTE) 1 % ophthalmic suspension Place 1 drop into both eyes in the morning and at bedtime.   propranolol (INDERAL) 20 MG tablet Take 1 tablet (20 mg total) by mouth 2 (two) times daily. (Patient taking differently: Take 10 mg by mouth 2 (two) times daily.)   RESTASIS 0.05 % ophthalmic emulsion 1 drop 2 (two) times daily.   Rimegepant Sulfate (NURTEC) 75 MG TBDP Take 75 mg by mouth daily as needed (take for abortive therapy of migraine, no more than 1 tablet in 24 hours or 10 per month).   traZODone (DESYREL) 100 MG tablet Take 1-2 tablets (100-200 mg total) by mouth at bedtime as needed for sleep.   valACYclovir  (VALTREX) 500 MG tablet Take 500 mg by mouth daily as needed (Flair up take Twice a day  for 3 days and stop).      Allergies:   Codeine, Penicillins, Divalproex sodium, Meloxicam, Sonata [zaleplon], Topamax [topiramate], Aciphex [rabeprazole], Aimovig [erenumab-aooe], and Galcanezumab-gnlm   ROS:   Please see the history of present illness.     All other systems reviewed and are negative.   Labs/Other Tests and Data Reviewed:    Recent Labs: 03/26/2020: Magnesium 2.2; TSH 0.938 11/25/2020: ALT 19; BUN 17; Creatinine, Ser 0.74; Hemoglobin 13.1; Platelets 134; Potassium 3.6; Sodium 140   Recent Lipid Panel Lab Results  Component Value Date/Time   CHOL 158 03/26/2020 08:17 AM   TRIG 65 03/26/2020 08:17 AM   HDL 55 03/26/2020 08:17 AM   CHOLHDL 2.9 03/26/2020 08:17 AM   CHOLHDL 2.8 12/06/2015 06:09 AM   LDLCALC 90 03/26/2020 08:17 AM    Wt Readings from Last 3 Encounters:  03/04/21 173 lb 6.4 oz (  78.7 kg)  02/04/21 171 lb 6.4 oz (77.7 kg)  12/30/20 172 lb (78 kg)     ASSESSMENT & PLAN:    URTI Suspected COVID-19 infection Oral thrush Check rapid flu and COVID test Mucinex or Robitussin as needed for cough Tessalon as needed for cough Advised to maintain adequate hydration Nystatin for oral thrush  Time:   Today, I have spent 9 minutes reviewing the chart, including problem list, medications, and with the patient with telehealth technology discussing the above problems.   Medication Adjustments/Labs and Tests Ordered: Current medicines are reviewed at length with the patient today.  Concerns regarding medicines are outlined above.   Tests Ordered: No orders of the defined types were placed in this encounter.   Medication Changes: No orders of the defined types were placed in this encounter.    Note: This dictation was prepared with Dragon dictation along with smaller phrase technology. Similar sounding words can be transcribed inadequately or may not be corrected  upon review. Any transcriptional errors that result from this process are unintentional.      Disposition:  Follow up  Signed, Lindell Spar, MD  03/19/2021 8:24 AM     [facility]

## 2021-03-20 ENCOUNTER — Encounter: Payer: Self-pay | Admitting: *Deleted

## 2021-03-21 LAB — NOVEL CORONAVIRUS, NAA: SARS-CoV-2, NAA: NOT DETECTED

## 2021-03-25 ENCOUNTER — Other Ambulatory Visit: Payer: Self-pay | Admitting: Internal Medicine

## 2021-03-25 ENCOUNTER — Telehealth: Payer: Self-pay | Admitting: Internal Medicine

## 2021-03-25 DIAGNOSIS — J069 Acute upper respiratory infection, unspecified: Secondary | ICD-10-CM

## 2021-03-25 MED ORDER — AZITHROMYCIN 250 MG PO TABS: ORAL_TABLET | ORAL | 0 refills | Status: AC

## 2021-03-25 MED ORDER — BENZONATATE 100 MG PO CAPS: 100.0000 mg | ORAL_CAPSULE | Freq: Two times a day (BID) | ORAL | 0 refills | Status: DC | PRN

## 2021-03-25 NOTE — Telephone Encounter (Signed)
Patient called in recent visit 03/19/21  ? ?Patient is still experiencing runny nose and coughing ( promoting vomiting)  ? ?Patient would more meds sent in to help with lingering symptoms.  ? ?Also wants a call back.  ?

## 2021-03-25 NOTE — Telephone Encounter (Signed)
Pt advised with verbal understanding  °

## 2021-03-26 DIAGNOSIS — F502 Bulimia nervosa: Secondary | ICD-10-CM | POA: Diagnosis not present

## 2021-03-26 DIAGNOSIS — F429 Obsessive-compulsive disorder, unspecified: Secondary | ICD-10-CM | POA: Diagnosis not present

## 2021-03-26 DIAGNOSIS — F3131 Bipolar disorder, current episode depressed, mild: Secondary | ICD-10-CM | POA: Diagnosis not present

## 2021-03-26 DIAGNOSIS — F4310 Post-traumatic stress disorder, unspecified: Secondary | ICD-10-CM | POA: Diagnosis not present

## 2021-03-28 ENCOUNTER — Telehealth: Payer: Self-pay | Admitting: Internal Medicine

## 2021-03-28 DIAGNOSIS — F4310 Post-traumatic stress disorder, unspecified: Secondary | ICD-10-CM | POA: Diagnosis not present

## 2021-03-28 DIAGNOSIS — F3131 Bipolar disorder, current episode depressed, mild: Secondary | ICD-10-CM | POA: Diagnosis not present

## 2021-03-28 DIAGNOSIS — F429 Obsessive-compulsive disorder, unspecified: Secondary | ICD-10-CM | POA: Diagnosis not present

## 2021-03-28 DIAGNOSIS — F502 Bulimia nervosa: Secondary | ICD-10-CM | POA: Diagnosis not present

## 2021-03-28 NOTE — Telephone Encounter (Signed)
Patient called in regard to visit 3/1 ? ?Patient would like to have promethazine sent in for cough . Cough is hurting in chest  ?

## 2021-03-31 ENCOUNTER — Ambulatory Visit: Payer: Medicare PPO | Admitting: Family Medicine

## 2021-04-01 ENCOUNTER — Other Ambulatory Visit: Payer: Self-pay | Admitting: Family Medicine

## 2021-04-01 MED ORDER — PROMETHAZINE-DM 6.25-15 MG/5ML PO SYRP: ORAL_SOLUTION | ORAL | 0 refills | Status: DC

## 2021-04-01 NOTE — Telephone Encounter (Signed)
Pt advised appt scheduled to follow up with patel  ?

## 2021-04-01 NOTE — Telephone Encounter (Signed)
Please advise 

## 2021-04-02 DIAGNOSIS — F429 Obsessive-compulsive disorder, unspecified: Secondary | ICD-10-CM | POA: Diagnosis not present

## 2021-04-02 DIAGNOSIS — F502 Bulimia nervosa: Secondary | ICD-10-CM | POA: Diagnosis not present

## 2021-04-02 DIAGNOSIS — F3131 Bipolar disorder, current episode depressed, mild: Secondary | ICD-10-CM | POA: Diagnosis not present

## 2021-04-02 DIAGNOSIS — F4310 Post-traumatic stress disorder, unspecified: Secondary | ICD-10-CM | POA: Diagnosis not present

## 2021-04-03 ENCOUNTER — Ambulatory Visit (INDEPENDENT_AMBULATORY_CARE_PROVIDER_SITE_OTHER): Payer: Medicare PPO | Admitting: Physician Assistant

## 2021-04-03 ENCOUNTER — Encounter: Payer: Self-pay | Admitting: Physician Assistant

## 2021-04-03 ENCOUNTER — Other Ambulatory Visit: Payer: Self-pay

## 2021-04-03 VITALS — BP 130/75 | HR 71

## 2021-04-03 DIAGNOSIS — G2571 Drug induced akathisia: Secondary | ICD-10-CM | POA: Diagnosis not present

## 2021-04-03 DIAGNOSIS — F429 Obsessive-compulsive disorder, unspecified: Secondary | ICD-10-CM

## 2021-04-03 DIAGNOSIS — F17200 Nicotine dependence, unspecified, uncomplicated: Secondary | ICD-10-CM | POA: Diagnosis not present

## 2021-04-03 DIAGNOSIS — F411 Generalized anxiety disorder: Secondary | ICD-10-CM

## 2021-04-03 DIAGNOSIS — G4700 Insomnia, unspecified: Secondary | ICD-10-CM | POA: Diagnosis not present

## 2021-04-03 DIAGNOSIS — F502 Bulimia nervosa: Secondary | ICD-10-CM | POA: Diagnosis not present

## 2021-04-03 DIAGNOSIS — F3130 Bipolar disorder, current episode depressed, mild or moderate severity, unspecified: Secondary | ICD-10-CM

## 2021-04-03 NOTE — Progress Notes (Signed)
[facility] ? ?Patient ID: Jennifer [name],  ?MRN: [id number] ? ?PCP: Lindell Spar, MD ? ?Date of Evaluation: 04/03/2021 ?Time spent:30 minutes ? ?Chief Complaint:  ?Chief Complaint   ?Anxiety; Depression; Insomnia; Follow-up ?  ? ? ? ?HISTORY/CURRENT STATUS: ?HPI For routine med check. ? ?At [place] she was having akathisia.  Klonopin was increased to routinely, and gabapentin was increased as well.  She followed those recommendations for 1 day but called back the next day stating she was still miserable.  Propranolol was added,, she got dizzy at 20 mg twice daily so we decreased to 10 mg twice daily.  She is feeling much better as far as the anxiety goes, she still occasionally has to take a Klonopin or hydroxyzine but she does not feel that internal jitteriness where she has to get up after sitting down, then get up again and move to a different chair, things like that.  Feels that the propranolol has really helped. ? ?Patient denies loss of interest in usual activities and is able to enjoy things.  Denies decreased energy or motivation.  Appetite has not changed.  No extreme sadness, tearfulness, or feelings of hopelessness.  Denies any changes in concentration, making decisions or remembering things.  Sleeps well.  Is still not able to work outside the home due to the bipolar disorder, depression, and anxiety.  Denies suicidal or homicidal thoughts. ? ?Patient denies increased energy with decreased need for sleep, no increased talkativeness, no racing thoughts, no impulsivity or risky behaviors, no increased spending, no increased libido, no grandiosity, no increased irritability or anger, and no hallucinations. ? ?Denies dizziness, syncope, seizures, numbness, tingling, tremor, tics, unsteady gait, slurred speech, confusion.  Has chronic joint pain.  No binging or purging in 'awhile.' Doesn't elaborate.  Does have chronic GERD but meds are helping. ? ?Individual Medical History/ Review of Systems:  Changes? :No      ?    ?Past medications for mental health diagnoses include: ?Risperdal, Seroquel, Prozac, Zoloft,  Wellbutrin, Lamictal, Depakote caused hair loss, Xanax, Ambien, trazodone, Trileptal, Luvox, Topamax, Elavil, Pamelor, BuSpar, doxazosin, prazosin, lithium, Vraylar, Abilify, [name], Latuda >60 mg caused abd pain and nausea ? ?Allergies: Codeine, Penicillins, Divalproex sodium, Meloxicam, Sonata [zaleplon], Topamax [topiramate], Aciphex [rabeprazole], Aimovig [erenumab-aooe], and Galcanezumab-gnlm ? ?Current Medications:  ?Current Outpatient Medications:  ?  benzonatate (TESSALON) 100 MG capsule, Take 1 capsule (100 mg total) by mouth 2 (two) times daily as needed for cough., Disp: 30 capsule, Rfl: 0 ?  betamethasone dipropionate 0.05 % cream, Apply topically 2 (two) times daily., Disp: 30 g, Rfl: 0 ?  Brexpiprazole ([name]) 4 MG TABS, Take 4 mg by mouth in the morning., Disp: 30 tablet, Rfl: 11 ?  clonazePAM (KLONOPIN) 0.5 MG tablet, Take 1 tablet (0.5 mg total) by mouth 3 (three) times daily as needed for anxiety., Disp: 30 tablet, Rfl: 5 ?  cromolyn (OPTICROM) 4 % ophthalmic solution, Place 1 drop into both eyes 2 (two) times daily., Disp: , Rfl:  ?  diclofenac Sodium (VOLTAREN) 1 % GEL, Apply 4 g topically 4 (four) times daily., Disp: 50 g, Rfl: 0 ?  dicyclomine (BENTYL) 20 MG tablet, Take 1 tablet (20 mg total) by mouth 3 (three) times daily as needed for spasms (abdominal cramping)., Disp: 20 tablet, Rfl: 0 ?  Famotidine-Ca Carb-Mag Hydrox (PEPCID COMPLETE PO), Take 1 tablet by mouth daily., Disp: , Rfl:  ?  fluticasone (FLONASE) 50 MCG/ACT nasal spray, Place 2 sprays into both nostrils daily.,  Disp: 16 g, Rfl: 6 ?  fluvoxaMINE (LUVOX) 100 MG tablet, Take 3 tablets (300 mg total) by mouth at bedtime., Disp: 270 tablet, Rfl: 3 ?  gabapentin (NEURONTIN) 600 MG tablet, 2 p.o. every morning, 1 p.o. q. afternoon, 2 p.o. nightly., Disp: 150 tablet, Rfl: 5 ?  hydrOXYzine (ATARAX) 25 MG tablet, Take  1-2 tablets (25-50 mg total) by mouth every 8 (eight) hours as needed., Disp: 90 tablet, Rfl: 11 ?  hyoscyamine (LEVSIN) 0.125 MG tablet, Take 1 tablet (0.125 mg total) by mouth every 4 (four) hours as needed (diarrhea)., Disp: 120 tablet, Rfl: 1 ?  loratadine (CLARITIN) 10 MG tablet, Take 5 mg by mouth daily as needed for allergies. , Disp: , Rfl:  ?  naproxen (NAPROSYN) 500 MG tablet, Take 500 mg by mouth 2 (two) times daily as needed for migraine., Disp: , Rfl:  ?  nystatin (MYCOSTATIN) 100000 UNIT/ML suspension, Take 5 mLs (500,000 Units total) by mouth 4 (four) times daily., Disp: 60 mL, Rfl: 0 ?  ondansetron (ZOFRAN-ODT) 4 MG disintegrating tablet, Take 1 tablet (4 mg total) by mouth every 8 (eight) hours as needed for nausea or vomiting., Disp: 30 tablet, Rfl: 1 ?  pramipexole (MIRAPEX) 0.5 MG tablet, Take 1 tablet (0.5 mg total) by mouth at bedtime., Disp: 90 tablet, Rfl: 3 ?  prednisoLONE acetate (PRED FORTE) 1 % ophthalmic suspension, Place 1 drop into both eyes in the morning and at bedtime., Disp: , Rfl:  ?  promethazine-dextromethorphan (PROMETHAZINE-DM) 6.25-15 MG/5ML syrup, TAKE HALF TEASPOON BY MOUTH EVERY 8  HOURS AS NEEDED, FOR EXCESSIVE COUGH, Disp: 118 mL, Rfl: 0 ?  propranolol (INDERAL) 20 MG tablet, Take 1 tablet (20 mg total) by mouth 2 (two) times daily. (Patient taking differently: Take 10 mg by mouth 2 (two) times daily.), Disp: 60 tablet, Rfl: 1 ?  RESTASIS 0.05 % ophthalmic emulsion, 1 drop 2 (two) times daily., Disp: , Rfl:  ?  Rimegepant Sulfate (NURTEC) 75 MG TBDP, Take 75 mg by mouth daily as needed (take for abortive therapy of migraine, no more than 1 tablet in 24 hours or 10 per month)., Disp: 8 tablet, Rfl: 11 ?  traZODone (DESYREL) 100 MG tablet, Take 1-2 tablets (100-200 mg total) by mouth at bedtime as needed for sleep., Disp: 60 tablet, Rfl: 11 ?  valACYclovir (VALTREX) 500 MG tablet, Take 500 mg by mouth daily as needed (Flair up take Twice a day  for 3 days and stop). ,  Disp: , Rfl:  ?Medication Side Effects: none ? ?Family Medical/ Social History: Changes?  No ? ?MENTAL HEALTH EXAM: ? ?Blood pressure 130/75, pulse 71, last menstrual period 10/07/2011.There is no height or weight on file to calculate BMI.  ?General Appearance: Casual and Well Groomed  ?Eye Contact:  Good  ?Speech:  Clear and Coherent and Normal Rate  ?Volume:  Normal  ?Mood:  Euthymic  ?Affect:  Congruent  ?Thought Process:  Goal Directed and Descriptions of Associations: Intact  ?Orientation:  Full (Time, Place, and Person)  ?Thought Content: Logical   ?Suicidal Thoughts:  No  ?Homicidal Thoughts:  No  ?Memory:  WNL  ?Judgement:  Good  ?Insight:  Good  ?Psychomotor Activity:  Restlessness and she often rubs the palm of her hands on her thighs  ?Concentration:  Concentration: Good and Attention Span: Good  ?Recall:  Good  ?Fund of Knowledge: Good  ?Language: Good  ?Assets:  Desire for Improvement  ?ADL's:  Intact  ?Cognition: WNL  ?Prognosis:  Good  ? ? ?  DIAGNOSES:  ?  ICD-10-CM   ?1. Acute medication-induced akathisia  G25.71   ? T50.905A   ?  ?2. Obsessive-compulsive disorder, unspecified type  F42.9   ?  ?3. Bipolar I disorder, most recent episode depressed ([place])  F31.30   ?  ?4. Insomnia, unspecified type  G47.00   ?  ?5. Generalized anxiety disorder  F41.1   ?  ?6. Bulimia  F50.2   ?  ?7. Smoker  F17.200   ?  ? ? ? ?Receiving Psychotherapy: [facility] [place] Records ? ? ?RECOMMENDATIONS:  ?PDMP was reviewed.  Last Klonopin filled 03/06/2021. ?I provided 30 minutes of face to face time during this encounter, including time spent before and after the visit in records review, medical decision making, counseling pertinent to today's visit, and charting.  ?I am glad she is better as far as the akathisia goes.  If she can tolerate the propranolol 10 mg midday I think it would be wise.  But if it makes her dizzy or lightheaded then continue twice daily dosing.  She understands. ?Smoking cessation discussed. ? ?Continue  [name] 4 mg, 1 p.o. daily. ?Continue Klonopin 0.5 mg, 1 p.o. twice daily prn. ?Continue Luvox 100 mg, 3 p.o. nightly. ?Continue gabapentin 600 mg, 2 qam., 1 in afternoon, 2 po qhs. ?Continue hydroxyzine 25-50 mg,

## 2021-04-07 ENCOUNTER — Other Ambulatory Visit: Payer: Self-pay | Admitting: Physician Assistant

## 2021-04-07 ENCOUNTER — Telehealth: Payer: Self-pay | Admitting: Physician Assistant

## 2021-04-07 MED ORDER — CLONIDINE HCL 0.1 MG PO TABS: ORAL_TABLET | ORAL | 0 refills | Status: DC

## 2021-04-07 NOTE — Telephone Encounter (Signed)
Pt has been having severe diarrhea since starting propranolol last week.She stated everything comes right out.She stopped taking it yesterday and diarrhea is not as bad.She wants to stop med but still needs something for tremors.

## 2021-04-07 NOTE — Telephone Encounter (Signed)
She has been on the propranolol since 03/07/2021.  We did add a midday dose last week, is that what she is referring to?  She did not mention the diarrhea to me at our appointment last week and it is not a common side effect with propranolol.  Could the diarrhea be coming from anything else?  This was prescribed for akathisia, not just tremors, and I recommend she stay on it twice a day if she can tolerate it.  If not, other options would be to change to clonidine, or decrease the [name] to 3 mg.  Thank you.

## 2021-04-07 NOTE — Telephone Encounter (Signed)
Pt stated has been having this problem for 3 weeks but did not mention because she did not think it was med related,but realized it is.She can't tolerate it.She rather not change rexulti she wants to try clonidine.She is using [place], [place]

## 2021-04-07 NOTE — Telephone Encounter (Signed)
Pt called at 9:10 am and said that she has been taking the proponol to help with her shaking. However it is tearing up her insides. She has stopped the medicine but will need something else for the shaking. Please give her a call at [phone] ?

## 2021-04-07 NOTE — Telephone Encounter (Signed)
Prescription for clonidine was sent. ?

## 2021-04-09 ENCOUNTER — Telehealth: Payer: Self-pay | Admitting: Physician Assistant

## 2021-04-09 DIAGNOSIS — F3131 Bipolar disorder, current episode depressed, mild: Secondary | ICD-10-CM | POA: Diagnosis not present

## 2021-04-09 DIAGNOSIS — F4310 Post-traumatic stress disorder, unspecified: Secondary | ICD-10-CM | POA: Diagnosis not present

## 2021-04-09 DIAGNOSIS — F429 Obsessive-compulsive disorder, unspecified: Secondary | ICD-10-CM | POA: Diagnosis not present

## 2021-04-09 DIAGNOSIS — F502 Bulimia nervosa: Secondary | ICD-10-CM | POA: Diagnosis not present

## 2021-04-09 NOTE — Telephone Encounter (Signed)
Pt called at 4:40 pm and said that Jennifer [name] started her on clonidine and she has been on it for two days. She has been crying for two days and her chest feels heavy. Please call her at [phone] ?

## 2021-04-10 ENCOUNTER — Other Ambulatory Visit: Payer: Self-pay

## 2021-04-10 NOTE — Telephone Encounter (Signed)
Since going off the propranolol, have the GI sx resolved?  If not, that was not the cause of the diarrhea in the first place and I want her to go back on the propranolol.  But if the diarrhea stopped since going off the propranolol, stay off of it as well as the clonidine, and continued the Klonopin as she is taking it.  Do this for another 2 days routinely then she can decrease to 1/2 pill in the morning and 1/2 pill in the afternoon for a few days, and then take only as needed.  If the akathisia recurs then she will have to continue the Klonopin more routinely.  Thanks.

## 2021-04-10 NOTE — Telephone Encounter (Signed)
Fiana call again this morning at 11:27 to report that she stopped the Clonidine and is now taking the Klonopin 1/2 in am, 1/2 at noon and 1/2 in afternoon and she is doing much better.  She is calm and her heart is not racing.  She wanted to know that she thinks this is the better way to go.  No need for you to be reaching another medication to try.  Appt 07/07/21 ?

## 2021-04-10 NOTE — Telephone Encounter (Signed)
Please see message from patient

## 2021-04-11 NOTE — Telephone Encounter (Signed)
Patient said the GI sx had improved, but have not resolved. Discussed the recommendations and she said she would adjust medications over the weekend and let us know the first of the week how things were going.  ?

## 2021-04-11 NOTE — Telephone Encounter (Signed)
Noted  

## 2021-04-14 ENCOUNTER — Telehealth: Payer: Self-pay

## 2021-04-14 MED ORDER — HYOSCYAMINE SULFATE 0.125 MG PO TABS: 0.1250 mg | ORAL_TABLET | ORAL | 4 refills | Status: DC | PRN

## 2021-04-14 NOTE — Telephone Encounter (Signed)
She should take the Klonopin, if she doesn't need the afternoon dose, it's ok to hold it.  She is right, it can be addictive, but in her case it is very unlikely, and she has to be treated for the akathisia in some way, or else we will have to take her off of the antipsychotic, which we do not want to have to do. ?Thanks.

## 2021-04-15 ENCOUNTER — Telehealth: Payer: Medicare PPO | Admitting: Internal Medicine

## 2021-04-15 ENCOUNTER — Other Ambulatory Visit: Payer: Self-pay

## 2021-04-15 NOTE — Telephone Encounter (Signed)
Patient notified of recommendations. 

## 2021-04-17 ENCOUNTER — Emergency Department (HOSPITAL_COMMUNITY)
Admission: EM | Admit: 2021-04-17 | Discharge: 2021-04-17 | Discharge status: Against Medical Advice | Payer: Medicare PPO | Source: Home / Self Care

## 2021-04-17 ENCOUNTER — Telehealth: Payer: Self-pay

## 2021-04-17 DIAGNOSIS — K529 Noninfective gastroenteritis and colitis, unspecified: Secondary | ICD-10-CM | POA: Diagnosis not present

## 2021-04-17 DIAGNOSIS — J069 Acute upper respiratory infection, unspecified: Secondary | ICD-10-CM | POA: Diagnosis not present

## 2021-04-17 DIAGNOSIS — K5792 Diverticulitis of intestine, part unspecified, without perforation or abscess without bleeding: Secondary | ICD-10-CM | POA: Diagnosis not present

## 2021-04-17 DIAGNOSIS — J019 Acute sinusitis, unspecified: Secondary | ICD-10-CM | POA: Diagnosis not present

## 2021-04-17 NOTE — Telephone Encounter (Signed)
Patient called wanted to let us know she ended up going to urgent care on [address] yesterday and he diagnosed her with bronchitis, diverticulitis, and a sinus infection. Just wanted to let us know in case we wanted to request records from this visit. Ph# [phone] ?

## 2021-04-21 ENCOUNTER — Ambulatory Visit: Payer: Medicare PPO | Admitting: Family Medicine

## 2021-04-21 ENCOUNTER — Encounter: Payer: Self-pay | Admitting: Family Medicine

## 2021-04-21 VITALS — BP 116/72 | HR 73 | Ht 64.0 in | Wt 178.4 lb

## 2021-04-21 DIAGNOSIS — K529 Noninfective gastroenteritis and colitis, unspecified: Secondary | ICD-10-CM | POA: Diagnosis not present

## 2021-04-21 NOTE — Patient Instructions (Signed)
I appreciate the opportunity to provide care for you today. ?  ?Please return the stool kit ASAP to test for Giardia using antigen test or polymerase chain reaction. ?Continue to stay hydrated and increase your fiber intake ?Please continue to take your OTC imodium ? ? ?  ?It was a pleasure to see you and I look forward to continuing to work together on your health and well-being. ?Please do not hesitate to call the office if you need care or have questions about your care. ?  ?Have a wonderful day and week. ?With Gratitude, ?Alvira Monday MSN, FNP-BC  ?

## 2021-04-21 NOTE — Progress Notes (Signed)
? ?Acute Office Visit ? ?Subjective:  ? ? Patient ID: Jennifer [name], female    DOB: 1965-02-25, 56 y.o.   MRN: [id number] ? ?Chief Complaint  ?Patient presents with  ? Diarrhea  ?  Pt started with this problem a month ago.   ? ? ?Jennifer [name] is a 56 y.o F seen today for chronic watery diarrhea without blood and mucus. The patient states that at the onset of her symptoms, she had bowel movements every 30 minutes and had to wear pull-ups. The patient went to the ED on 04/17/2021 but left shortly without being seen. On 04/18/2021, the patient visited urgent care and was told to take OTC imodium. The patient was also prescribed levofloxacin and metronidazole, which she has stopped taking due to their side effects. The metronidazole caused dizziness, and the levofloxacin heightened her mania, making her suicidal for an hour. The patient admits that the Imodium has provided symptomatic relief.  ? ?The patient shared that during the week of 3/26, she had a fever of 100.9 F with nausea and vomiting. The patient took Aleve and Zofran for symptomatic management with no fever since. ? ?The patient has been drinking Gatorade, Pedialyte, and water in keeping with her hydration. ? ?The patient sees Dr. Gala Romney at [facility] and has a f/u appt on 05/02/2021. ? ? ? ?Past Medical History:  ?Diagnosis Date  ? Abdominal pain, epigastric 10/07/2018  ? Acute non-recurrent maxillary sinusitis 11/07/2019  ? Anxiety   ? Arthritis   ? Binge-eating and purging type anorexia nervosa   ? Bipolar affective disorder ([place]) 12/04/2015  ? Bipolar affective disorder, current episode manic without psychotic symptoms ([place]) 12/04/2015  ? Bipolar disorder ([place])   ? Cellulitis and abscess of buttock 02/15/2013  ? Cystitis, interstitial   ? Depression   ? Depression   ? Phreesia 01/28/2020  ? Esophageal polyp   ? about 20 years ago  ? GERD (gastroesophageal reflux disease)   ? Left wrist pain 03/22/2019  ? OCD (obsessive compulsive  disorder)   ? Palpitations 05/25/2014  ? Rectal pain 12/06/2018  ? Rib pain on right side 01/31/2019  ? Rosacea 03/27/2019  ? Sprain of temporomandibular joint or ligament 01/10/2019  ? ? ?Past Surgical History:  ?Procedure Laterality Date  ? BLADDER SURGERY    ? CHOLECYSTECTOMY    ? COLONOSCOPY WITH PROPOFOL N/A 10/05/2019  ? Procedure: COLONOSCOPY WITH PROPOFOL;  Surgeon: Daneil Dolin, MD;  [facility];  Service: Endoscopy;  Laterality: N/A;  12:45pm  ? COSMETIC SURGERY N/A   ? Phreesia 01/28/2020  ? ESOPHAGOGASTRODUODENOSCOPY  09/28/2016  ? [facility]; Dr. Therisa Doyne; erosions in the esophagus, 5 cm hiatal hernia, nonbleeding erosive gastropathy s/p biopsied, normal duodenum.  Path with chronic inactive gastritis, no H. pylori or intestinal metaplasia.   ? ESOPHAGOGASTRODUODENOSCOPY (EGD) WITH PROPOFOL N/A 10/17/2018  ? Dr. Gala Romney: mild reflux esophagitis, small hiatal hernia  ? POLYPECTOMY  10/05/2019  ? Procedure: POLYPECTOMY;  Surgeon: Daneil Dolin, MD;  Location: [facility];  Service: Endoscopy;;  ? VOCAL CORD LATERALIZATION, ENDOSCOPIC APPROACH W/ MLB    ? ? ?Family History  ?Problem Relation Age of Onset  ? Healthy Mother   ? Healthy Father   ? Colon cancer Neg Hx   ? Colon polyps Neg Hx   ? ? ?Social History  ? ?Socioeconomic History  ? Marital status: Married  ?  Spouse name: [name]  ? Number of children: 0  ? Years of  education: Not on file  ? Highest education level: Not on file  ?Occupational History  ? Not on file  ?Tobacco Use  ? Smoking status: Every Day  ?  Packs/day: 0.50  ?  Years: 20.00  ?  Pack years: 10.00  ?  Types: Cigarettes  ?  Start date: 01/20/1988  ? Smokeless tobacco: Never  ?Vaping Use  ? Vaping Use: Never used  ?Substance and Sexual Activity  ? Alcohol use: No  ?  Alcohol/week: 0.0 standard drinks  ? Drug use: No  ? Sexual activity: Yes  ?  Birth control/protection: None  ?Other Topics Concern  ? Not on file  ?Social History Narrative  ? Lives with husband -[name] of 15 years   ?    ? [place]- [place]  ?   ? Enjoys: reading-all genres  ?   ? Diet: eats all food groups  ? Caffeine: 1 cup daily at times, diet dr pepper daily  ? Water: gatorade  zero and water   ?   ? Wears seat belt   ? Does not use phone while driving  ? 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 Data processing manager  ? Weapons  -safe area  ?   ?   ? ?Social Determinants of Health  ? ?Financial Resource Strain: Low Risk   ? Difficulty of Paying Living Expenses: Not hard at all  ?Food Insecurity: No Food Insecurity  ? Worried About Charity fundraiser in the Last Year: Never true  ? Ran Out of Food in the Last Year: Never true  ?Transportation Needs: No Transportation Needs  ? Lack of Transportation (Medical): No  ? Lack of Transportation (Non-Medical): No  ?Physical Activity: Insufficiently Active  ? Days of Exercise per Week: 7 days  ? Minutes of Exercise per Session: 20 min  ?Stress: Stress Concern Present  ? Feeling of Stress : To some extent  ?Social Connections: Moderately Integrated  ? Frequency of Communication with Friends and Family: More than three times a week  ? Frequency of Social Gatherings with Friends and Family: Once a week  ? Attends Religious Services: More than 4 times per year  ? Active Member of Clubs or Organizations: No  ? Attends Archivist Meetings: Never  ? Marital Status: Married  ?Intimate Partner Violence: Unknown  ? Fear of Current or Ex-Partner: No  ? Emotionally Abused: No  ? Physically Abused: No  ? Sexually Abused: Not on file  ? ? ?Outpatient Medications Prior to Visit  ?Medication Sig Dispense Refill  ? betamethasone dipropionate 0.05 % cream Apply topically 2 (two) times daily. 30 g 0  ? Brexpiprazole ([name]) 4 MG TABS Take 4 mg by mouth in the morning. 30 tablet 11  ? clonazePAM (KLONOPIN) 0.5 MG tablet Take 1 tablet (0.5 mg total) by mouth 3 (three) times daily as needed for anxiety. 30 tablet 5  ? cromolyn (OPTICROM) 4 % ophthalmic solution Place 1 drop into both eyes 2 (two) times  daily.    ? diclofenac Sodium (VOLTAREN) 1 % GEL Apply 4 g topically 4 (four) times daily. 50 g 0  ? dicyclomine (BENTYL) 20 MG tablet Take 1 tablet (20 mg total) by mouth 3 (three) times daily as needed for spasms (abdominal cramping). 20 tablet 0  ? Famotidine-Ca Carb-Mag Hydrox (PEPCID COMPLETE PO) Take 1 tablet by mouth daily.    ? fluticasone (FLONASE) 50 MCG/ACT nasal spray Place 2 sprays into both nostrils daily. 16 g 6  ?  fluvoxaMINE (LUVOX) 100 MG tablet Take 3 tablets (300 mg total) by mouth at bedtime. 270 tablet 3  ? gabapentin (NEURONTIN) 600 MG tablet 2 p.o. every morning, 1 p.o. q. afternoon, 2 p.o. nightly. 150 tablet 5  ? hydrOXYzine (ATARAX) 25 MG tablet Take 1-2 tablets (25-50 mg total) by mouth every 8 (eight) hours as needed. 90 tablet 11  ? hyoscyamine (LEVSIN) 0.125 MG tablet Take 1 tablet (0.125 mg total) by mouth every 4 (four) hours as needed (diarrhea). 60 tablet 4  ? loperamide (IMODIUM) 2 MG capsule Take 2 mg by mouth as needed for diarrhea or loose stools.    ? loratadine (CLARITIN) 10 MG tablet Take 5 mg by mouth daily as needed for allergies.     ? metroNIDAZOLE (FLAGYL) 500 MG tablet Take 500 mg by mouth every 6 (six) hours.    ? naproxen (NAPROSYN) 500 MG tablet Take 500 mg by mouth 2 (two) times daily as needed for migraine.    ? ondansetron (ZOFRAN-ODT) 4 MG disintegrating tablet Take 1 tablet (4 mg total) by mouth every 8 (eight) hours as needed for nausea or vomiting. 30 tablet 1  ? pramipexole (MIRAPEX) 0.5 MG tablet Take 1 tablet (0.5 mg total) by mouth at bedtime. 90 tablet 3  ? prednisoLONE acetate (PRED FORTE) 1 % ophthalmic suspension Place 1 drop into both eyes in the morning and at bedtime.    ? RESTASIS 0.05 % ophthalmic emulsion 1 drop 2 (two) times daily.    ? Rimegepant Sulfate (NURTEC) 75 MG TBDP Take 75 mg by mouth daily as needed (take for abortive therapy of migraine, no more than 1 tablet in 24 hours or 10 per month). 8 tablet 11  ? traZODone (DESYREL) 100 MG  tablet Take 1-2 tablets (100-200 mg total) by mouth at bedtime as needed for sleep. 60 tablet 11  ? valACYclovir (VALTREX) 500 MG tablet Take 500 mg by mouth daily as needed (Flair up take Twice a day  for

## 2021-04-23 DIAGNOSIS — F502 Bulimia nervosa: Secondary | ICD-10-CM | POA: Diagnosis not present

## 2021-04-23 DIAGNOSIS — F4310 Post-traumatic stress disorder, unspecified: Secondary | ICD-10-CM | POA: Diagnosis not present

## 2021-04-23 DIAGNOSIS — F3131 Bipolar disorder, current episode depressed, mild: Secondary | ICD-10-CM | POA: Diagnosis not present

## 2021-04-23 DIAGNOSIS — F429 Obsessive-compulsive disorder, unspecified: Secondary | ICD-10-CM | POA: Diagnosis not present

## 2021-04-23 LAB — GI PROFILE, STOOL, PCR

## 2021-04-28 ENCOUNTER — Ambulatory Visit: Payer: Medicare PPO | Admitting: Internal Medicine

## 2021-04-30 NOTE — Progress Notes (Signed)
? ?GI Office Note   ? ?Referring Provider: Lindell Spar, MD ?Primary Care Physician:  Lindell Spar, MD ?Primary GI: Dr. Gala Romney ? ?Date:  05/01/2021  ?ID:  Jennifer [name], DOB 12/09/1965, MRN [id number] ? ? ?Chief Complaint  ? ?Chief Complaint  ?Patient presents with  ? Diarrhea  ?  Has been going on for about 6 weeks off and on. States that her pcp said she had the noro virus. Been taking imodium and it has been helping. Did have an rx for bentyl for the abd cramps but couldn't find it.   ? ? ? ?History of Present Illness  ?Jennifer [name] is a 56 y.o. female presenting today with a history of GERD, bulimia nervosa and fatty liver presenting today with complaint of diarrhea. ? ?Last office visit in this clinic with Dr. Gala Romney on 02/04/2021 at which time she reported that she was doing well, walking 30 minutes a day had lost 7 pounds since her last previous visit.  Her ultrasound revealed fatty liver only, and her reflux was well controlled with as needed Pepcid.  She is seeing a therapist for her bulimia and symptoms were well controlled she was encouraged to attempt to lose another 10 pounds within the next year and to continue Pepcid as needed, follow-up in 1 year or sooner if needed. ? ?Patient seen PCP on 04/21/2021 for chronic watery diarrhea without blood or mucus, at this time patient stated she had a bowel movement every 30 minutes and had to wear pull-ups.she had a fever of 100.9 with nausea and vomiting the week of 3/26, took Aleve and Zofran for symptomatic management and had been drinking Gatorade, Pedialyte, and water to keep her self hydrated.  Went to the ED on 04/17/2021 and left without being seen, went to urgent care on 04/18/2021 was told to take over-the-counter Imodium and was also prescribed levofloxacin and metronidazole for which she stopped due to significant side effects including suicidal ideation.  At this time she reported that she had some symptomatic relief from Imodium.  GI pathogen panel  ordered and encouraged to stay hydrated and increase fiber intake. GI pathogen panel ordered by PCP positive for Norovirus GI/GII on 04/21/21.  ? ? ?Diarrhea ?Patient complains of diarrhea. Symptoms have been present for approximately 6 weeks. The symptoms were waxing and waning and from M-W this week with most predominant diarrhea and cramping and Tuesday. Stool frequency is approximately  3-4  per day. Patient estimates stool volume to be amount varies from moderate to small amounts as she has some incomplete defecation and some occasional urgency. Diarrhea does not occur at night. The patient has noted no bleeding associated with bowel movements. The also patient reports the following symptoms: abdominal cramping relieved by defecation, bloating, fecal urgency, loose stools, and watery diarrhea. The patient denies the following symptoms: diarrhea alternating with constipation, fecal incontinence, fever, flatulence, floating stools, greasy stool, joint aches, mucus with stools, night sweats, nocturnal diarrhea, red blood with stools, and stools difficult to flush. The patient admits to abdominal pain, located in the periumbilical area and in the lower abdomen. The pain is described as dull and pressure-like.   Relationship to food: the diarrhea is made worse by foods. Relationship to medications: the diarrhea is made worse by propranolol. Other risk factors: emotional stress, mental illness, bulemia. Therapy tried so far: OTC antidiarrheal: imodium, results satisfactory. Work up so far: C. difficile toxin negative, stool Giardia study negative, GI pathogen positive for norovirus. ? ?  Patient has reported frequent azithromycin over the last year.  Issues began after propranolol then improved.  Symptoms randomly worsened and was not found to have norovirus on recent stool testing.  She has had intermittent worsening and improvement of abdominal cramping with diarrhea.  She has had no fevers since prior to diagnosis of  norovirus.  Last episode of diarrhea was last night that was mostly water with little bits of stool.  Has eaten a chicken salad sandwich today and a cracker and has not had any diarrhea or abdominal cramping.  She reports feeling much better today.  Stated that pain and diarrhea was almost unbearable while she had norovirus, she had to wear pull-ups during this time.  Because she had 3 incontinent episodes in her bed due to the severity of her diarrhea. ? ?Bloating and bulemia, sometimes will eat little bits of cake or something then vomit - admits to this the other night. Episodes of bulemia usually happen at night, eating small amounts to get past the feeling of needing to vomit, will distract herself and read. Had fear that she had done something to her stomach that time. Sees certain foods that trigger her.  ? ?Aunt - found cancerous tumor during colonoscopy a couple of days ago and has had surgery. ? ? ?Past Medical History:  ?Diagnosis Date  ? Abdominal pain, epigastric 10/07/2018  ? Acute non-recurrent maxillary sinusitis 11/07/2019  ? Anxiety   ? Arthritis   ? Binge-eating and purging type anorexia nervosa   ? Bipolar affective disorder ([place]) 12/04/2015  ? Bipolar affective disorder, current episode manic without psychotic symptoms ([place]) 12/04/2015  ? Bipolar disorder ([place])   ? Cellulitis and abscess of buttock 02/15/2013  ? Cystitis, interstitial   ? Depression   ? Depression   ? [employer] 01/28/2020  ? Esophageal polyp   ? about 20 years ago  ? GERD (gastroesophageal reflux disease)   ? Left wrist pain 03/22/2019  ? OCD (obsessive compulsive disorder)   ? Palpitations 05/25/2014  ? Rectal pain 12/06/2018  ? Rib pain on right side 01/31/2019  ? Rosacea 03/27/2019  ? Sprain of temporomandibular joint or ligament 01/10/2019  ? ? ?Past Surgical History:  ?Procedure Laterality Date  ? BLADDER SURGERY    ? CHOLECYSTECTOMY    ? COLONOSCOPY WITH PROPOFOL N/A 10/05/2019  ? Procedure: COLONOSCOPY WITH PROPOFOL;   Surgeon: Daneil Dolin, MD;  [facility]: [facility];  Service: Endoscopy;  Laterality: N/A;  12:45pm  ? COSMETIC SURGERY N/A   ? [employer] 01/28/2020  ? ESOPHAGOGASTRODUODENOSCOPY  09/28/2016  ? [facility]; Dr. Therisa Doyne; erosions in the esophagus, 5 cm hiatal hernia, nonbleeding erosive gastropathy s/p biopsied, normal duodenum.  Path with chronic inactive gastritis, no H. pylori or intestinal metaplasia.   ? ESOPHAGOGASTRODUODENOSCOPY (EGD) WITH PROPOFOL N/A 10/17/2018  ? Dr. Gala Romney: mild reflux esophagitis, small hiatal hernia  ? POLYPECTOMY  10/05/2019  ? Procedure: POLYPECTOMY;  Surgeon: Daneil Dolin, MD;  Location: [facility];  Service: Endoscopy;;  ? VOCAL CORD LATERALIZATION, ENDOSCOPIC APPROACH W/ MLB    ? ? ?Current Outpatient Medications  ?Medication Sig Dispense Refill  ? betamethasone dipropionate 0.05 % cream Apply topically 2 (two) times daily. 30 g 0  ? Brexpiprazole ([name]) 4 MG TABS Take 4 mg by mouth in the morning. 30 tablet 11  ? clonazePAM (KLONOPIN) 0.5 MG tablet Take 1 tablet (0.5 mg total) by mouth 3 (three) times daily as needed for anxiety. 30 tablet 5  ? cromolyn (  OPTICROM) 4 % ophthalmic solution Place 1 drop into both eyes 2 (two) times daily.    ? diclofenac Sodium (VOLTAREN) 1 % GEL Apply 4 g topically 4 (four) times daily. 50 g 0  ? Famotidine-Ca Carb-Mag Hydrox (PEPCID COMPLETE PO) Take 1 tablet by mouth daily.    ? fluticasone (FLONASE) 50 MCG/ACT nasal spray Place 2 sprays into both nostrils daily. 16 g 6  ? fluvoxaMINE (LUVOX) 100 MG tablet Take 3 tablets (300 mg total) by mouth at bedtime. 270 tablet 3  ? gabapentin (NEURONTIN) 600 MG tablet 2 p.o. every morning, 1 p.o. q. afternoon, 2 p.o. nightly. 150 tablet 5  ? hydrOXYzine (ATARAX) 25 MG tablet Take 1-2 tablets (25-50 mg total) by mouth every 8 (eight) hours as needed. 90 tablet 11  ? loperamide (IMODIUM) 2 MG capsule Take 2 mg by mouth as needed for diarrhea or loose stools.    ? loratadine (CLARITIN) 10 MG tablet Take 5  mg by mouth daily as needed for allergies.     ? naproxen (NAPROSYN) 500 MG tablet Take 500 mg by mouth 2 (two) times daily as needed for migraine.    ? ondansetron (ZOFRAN-ODT) 4 MG disintegrating tablet

## 2021-05-01 ENCOUNTER — Ambulatory Visit: Payer: Medicare PPO | Admitting: Gastroenterology

## 2021-05-01 ENCOUNTER — Encounter: Payer: Self-pay | Admitting: Gastroenterology

## 2021-05-01 VITALS — BP 125/85 | HR 73 | Temp 96.9°F | Ht 64.0 in | Wt 173.0 lb

## 2021-05-01 DIAGNOSIS — R109 Unspecified abdominal pain: Secondary | ICD-10-CM | POA: Diagnosis not present

## 2021-05-01 DIAGNOSIS — R197 Diarrhea, unspecified: Secondary | ICD-10-CM

## 2021-05-01 MED ORDER — DICYCLOMINE HCL 20 MG PO TABS: 20.0000 mg | ORAL_TABLET | Freq: Three times a day (TID) | ORAL | 0 refills | Status: DC | PRN

## 2021-05-01 NOTE — Patient Instructions (Addendum)
Stop taking Levsin since has not been helping you.  Sending refill for dicyclomine for you to take as needed for abdominal cramping and diarrhea.  You may continue to take Imodium as needed, do not take more than box recommends. ? ?I will order stool studies for you to have completed at [facility] at your convenience if diarrhea/loose stools return.  They will not run sample if stools are solid.  Continue to stay hydrated.  If diarrhea does not return no need to complete stool samples and please call the office so we can cancel orders if needed. ? ?I recommend that you increase soluble fiber such as Benefiber (2 teaspoons daily) and AVOID lactose. I encourage a blander diet with boiled starches and cereals (eg, potatoes, noodles, rice, wheat, and oat) with salt; crackers, bananas, soup, and boiled vegetables may also be consumed. Oats and oatmeal are okay however would recommend wearing it with Lactaid or with water which ever you prefer.  ? ?Glad you are feeling better and it was nice to meet you today! ? ?Follow-up in January unless symptoms continue or worsen or other issues arise. ? ?It was a pleasure to see you today. I want to create trusting relationships with patients. If you receive a survey regarding your visit,  I greatly appreciate you taking time to fill this out on paper or through your MyChart. I value your feedback. ? ?[name], MSN, FNP-BC, AGACNP-BC ?[facility] ? ? ? ? ?

## 2021-05-02 ENCOUNTER — Other Ambulatory Visit: Payer: Self-pay | Admitting: Internal Medicine

## 2021-05-02 DIAGNOSIS — R197 Diarrhea, unspecified: Secondary | ICD-10-CM

## 2021-05-06 ENCOUNTER — Telehealth: Payer: Self-pay | Admitting: Internal Medicine

## 2021-05-06 ENCOUNTER — Ambulatory Visit: Payer: Medicare PPO | Admitting: Internal Medicine

## 2021-05-06 NOTE — Telephone Encounter (Signed)
PATIENT CALLED AND SAID THAT SHE WAS SUPPOSED TO GIVE A STOOL SAMPLE BUT SHE HAS NOT HAD ANY MORE LOOSE STOOLS.  SO YOU NEED TO CANCEL THE LAB ORDER?  ?

## 2021-05-07 DIAGNOSIS — F502 Bulimia nervosa: Secondary | ICD-10-CM | POA: Diagnosis not present

## 2021-05-07 DIAGNOSIS — F4310 Post-traumatic stress disorder, unspecified: Secondary | ICD-10-CM | POA: Diagnosis not present

## 2021-05-07 DIAGNOSIS — F429 Obsessive-compulsive disorder, unspecified: Secondary | ICD-10-CM | POA: Diagnosis not present

## 2021-05-07 DIAGNOSIS — F3131 Bipolar disorder, current episode depressed, mild: Secondary | ICD-10-CM | POA: Diagnosis not present

## 2021-05-14 DIAGNOSIS — F3131 Bipolar disorder, current episode depressed, mild: Secondary | ICD-10-CM | POA: Diagnosis not present

## 2021-05-14 DIAGNOSIS — F429 Obsessive-compulsive disorder, unspecified: Secondary | ICD-10-CM | POA: Diagnosis not present

## 2021-05-14 DIAGNOSIS — F502 Bulimia nervosa: Secondary | ICD-10-CM | POA: Diagnosis not present

## 2021-05-14 DIAGNOSIS — F4310 Post-traumatic stress disorder, unspecified: Secondary | ICD-10-CM | POA: Diagnosis not present

## 2021-05-20 DIAGNOSIS — F429 Obsessive-compulsive disorder, unspecified: Secondary | ICD-10-CM | POA: Diagnosis not present

## 2021-05-20 DIAGNOSIS — F4310 Post-traumatic stress disorder, unspecified: Secondary | ICD-10-CM | POA: Diagnosis not present

## 2021-05-20 DIAGNOSIS — F3131 Bipolar disorder, current episode depressed, mild: Secondary | ICD-10-CM | POA: Diagnosis not present

## 2021-05-20 DIAGNOSIS — F502 Bulimia nervosa: Secondary | ICD-10-CM | POA: Diagnosis not present

## 2021-05-27 ENCOUNTER — Telehealth: Payer: Self-pay | Admitting: Physician Assistant

## 2021-05-27 NOTE — Telephone Encounter (Signed)
Next visit is 07/07/21. Jennifer [name] called and is taking Klonopin 0.5 mg 1/2 in AM and PM. Side effects have been shaking, depression and feels like a failure. Are these side effects for not taking enough? Should she be taking one tablet in AM and PM? Please call her at [phone]. ?

## 2021-05-27 NOTE — Telephone Encounter (Signed)
See message. I called patient and she said she is only taking 0.25 mg BID, said she would be a zombie if she took 0.5 mg TID. She said she is sleeping, but not great. She is able to get to sleep, but has broken sleep. She estimates she gets 6/8 hours a night. She said sometimes she takes 12.5 mg hydroxyzine when she can't sleep to stop the racing thoughts. Anxiety is 7-8/10 off Klonopin and 4-5/10 on Klonopin - manageable per patient. She said her depression currently is 7-8/10 and she feels like it is getting worse. No afternoon caffeine use.  ? ?I need to verify pharmacy.  ?

## 2021-05-28 ENCOUNTER — Other Ambulatory Visit: Payer: Self-pay | Admitting: Physician Assistant

## 2021-05-28 DIAGNOSIS — F502 Bulimia nervosa: Secondary | ICD-10-CM | POA: Diagnosis not present

## 2021-05-28 DIAGNOSIS — F4310 Post-traumatic stress disorder, unspecified: Secondary | ICD-10-CM | POA: Diagnosis not present

## 2021-05-28 DIAGNOSIS — F3131 Bipolar disorder, current episode depressed, mild: Secondary | ICD-10-CM | POA: Diagnosis not present

## 2021-05-28 DIAGNOSIS — F429 Obsessive-compulsive disorder, unspecified: Secondary | ICD-10-CM | POA: Diagnosis not present

## 2021-05-28 MED ORDER — MIRTAZAPINE 7.5 MG PO TABS: 7.5000 mg | ORAL_TABLET | Freq: Every day | ORAL | 1 refills | Status: DC

## 2021-05-28 NOTE — Telephone Encounter (Signed)
Pharmacy is [facility].  ? ?Recommendations were given to the patient. She said she had been on mirtazapine 7.5 mg before. She didn't remember the issue with it. I looked in medication history and it looks like she was on 7.5 mg in January 2020 and then again 11/21-1/22. I briefly looked through notes around those timeframes to see if I could find details and I didn't find anything other than a mention that she could take either mirtazapine or trazodone, not both.  ?

## 2021-05-28 NOTE — Telephone Encounter (Signed)
Notified patient of recommendations. She said that she had not taken the mirtazapine but a few nights previously and stopped it because she wasn't seeing benefit. I told her she needed to give it more than a few days. She knows not to take trazodone AND mirtazapine. I told her this was a low dose and there was room to increase it later if needed.  ?

## 2021-05-28 NOTE — Telephone Encounter (Signed)
Please give her a call and let her know I am going to send in mirtazapine.  This is a good choice because it is an antidepressant but also helps akathisia and sleep.  It will be 7.5 mg, 1 p.o. nightly.  She is let me know which pharmacy and I will send it in.  ?She may not need the Klonopin routinely after being on the mirtazapine, at least for the akathisia part.  But it is okay to continue the Klonopin for now while the mirtazapine is getting in her system. ?Thank you.

## 2021-05-28 NOTE — Telephone Encounter (Signed)
I sent the prescription in. ?You may have already mentioned this to her, especially since you found that in the old notes from late 2021 but she will need to stop the trazodone now that she is going on the mirtazapine.  I can increase the dose of mirtazapine if we need to for sleep, but we should start at 7.5 mg and then go up if necessary. ?Thanks for looking back in her chart to get more details. I appreciate it!

## 2021-06-05 ENCOUNTER — Encounter (HOSPITAL_COMMUNITY): Payer: Self-pay

## 2021-06-05 ENCOUNTER — Emergency Department (HOSPITAL_COMMUNITY)
Admission: EM | Admit: 2021-06-05 | Discharge: 2021-06-05 | Discharge status: Against Medical Advice | Payer: Medicare PPO | Attending: Student | Admitting: Student

## 2021-06-05 ENCOUNTER — Other Ambulatory Visit: Payer: Self-pay

## 2021-06-05 DIAGNOSIS — T50901A Poisoning by unspecified drugs, medicaments and biological substances, accidental (unintentional), initial encounter: Secondary | ICD-10-CM | POA: Diagnosis not present

## 2021-06-05 DIAGNOSIS — Z5321 Procedure and treatment not carried out due to patient leaving prior to being seen by health care provider: Secondary | ICD-10-CM | POA: Diagnosis not present

## 2021-06-05 DIAGNOSIS — F329 Major depressive disorder, single episode, unspecified: Secondary | ICD-10-CM | POA: Insufficient documentation

## 2021-06-05 DIAGNOSIS — F32A Depression, unspecified: Secondary | ICD-10-CM

## 2021-06-05 NOTE — ED Provider Notes (Addendum)
I attempted to evaluate the patient due to concern for intentional versus nonintentional Klonopin overdose.  Patient was being triaged when there was an apparent discussion between the patient and her husband about leaving the emergency department and when I came to evaluate the situation, both the husband and the patient had eloped from the emergency department.  I attempted to contact the husband to obtain more information but was unable to reach the patient or the husband.   Shebra Muldrow, Debe Coder, MD 06/05/21 1351   Update: I was able to reach the patient and her husband and clearly stated that I think that she should be evaluated in the emergency department in full due to her intentional Klonopin overdose and worsening depression.  The patient voiced understanding of my desire to have her return to the emergency department.  I spoke with the husband who states that he is concerned the patient may be having conditional suicidality and borderline personality disorder type behavior given that she took the exact amount of medication she would need to garner attention but not threaten her life.  I informed him that I cannot prove this without evaluating the patient and that she should come to the emergency department for evaluation.   Teressa Lower, MD 06/05/21 1427

## 2021-06-05 NOTE — ED Notes (Signed)
Pt does not want to stay and get seen by provider.  This nurse went to inform provider due to her wanting to leave.  Provider came to triage to evaluate pt.  Pt left prior to getting back to triage with provider.  Did not sign ama.

## 2021-06-05 NOTE — ED Triage Notes (Addendum)
Pt reports that she was feeling sad and just wanted to sleep and intentionally took 5 klonopin 0.'5mg'$  approximately 1 hour ago.  Pt states that she does not want to die, just wanted to sleep.  Pt states that she did have thought about killing herself prior to taking the pills.  Pt is alert and tearful in triage.  Resp even and unlabored.

## 2021-06-06 DIAGNOSIS — F3131 Bipolar disorder, current episode depressed, mild: Secondary | ICD-10-CM | POA: Diagnosis not present

## 2021-06-06 DIAGNOSIS — F502 Bulimia nervosa: Secondary | ICD-10-CM | POA: Diagnosis not present

## 2021-06-06 DIAGNOSIS — F429 Obsessive-compulsive disorder, unspecified: Secondary | ICD-10-CM | POA: Diagnosis not present

## 2021-06-06 DIAGNOSIS — F4310 Post-traumatic stress disorder, unspecified: Secondary | ICD-10-CM | POA: Diagnosis not present

## 2021-06-09 DIAGNOSIS — H00024 Hordeolum internum left upper eyelid: Secondary | ICD-10-CM | POA: Diagnosis not present

## 2021-06-11 DIAGNOSIS — F429 Obsessive-compulsive disorder, unspecified: Secondary | ICD-10-CM | POA: Diagnosis not present

## 2021-06-11 DIAGNOSIS — F502 Bulimia nervosa: Secondary | ICD-10-CM | POA: Diagnosis not present

## 2021-06-11 DIAGNOSIS — F4310 Post-traumatic stress disorder, unspecified: Secondary | ICD-10-CM | POA: Diagnosis not present

## 2021-06-11 DIAGNOSIS — F3131 Bipolar disorder, current episode depressed, mild: Secondary | ICD-10-CM | POA: Diagnosis not present

## 2021-06-12 ENCOUNTER — Encounter: Payer: Self-pay | Admitting: Physician Assistant

## 2021-06-12 ENCOUNTER — Telehealth (INDEPENDENT_AMBULATORY_CARE_PROVIDER_SITE_OTHER): Payer: Medicare PPO | Admitting: Physician Assistant

## 2021-06-12 DIAGNOSIS — F502 Bulimia nervosa: Secondary | ICD-10-CM

## 2021-06-12 DIAGNOSIS — F319 Bipolar disorder, unspecified: Secondary | ICD-10-CM | POA: Diagnosis not present

## 2021-06-12 DIAGNOSIS — F4323 Adjustment disorder with mixed anxiety and depressed mood: Secondary | ICD-10-CM | POA: Diagnosis not present

## 2021-06-12 DIAGNOSIS — Z630 Problems in relationship with spouse or partner: Secondary | ICD-10-CM

## 2021-06-12 DIAGNOSIS — G4700 Insomnia, unspecified: Secondary | ICD-10-CM

## 2021-06-12 DIAGNOSIS — F429 Obsessive-compulsive disorder, unspecified: Secondary | ICD-10-CM

## 2021-06-12 DIAGNOSIS — F411 Generalized anxiety disorder: Secondary | ICD-10-CM | POA: Diagnosis not present
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
## 2021-06-25 ENCOUNTER — Encounter: Payer: Self-pay | Admitting: Internal Medicine

## 2021-06-25 ENCOUNTER — Ambulatory Visit: Payer: Medicare PPO | Admitting: Internal Medicine

## 2021-06-25 VITALS — BP 120/84 | HR 75 | Resp 16 | Ht 64.0 in | Wt 177.6 lb

## 2021-06-25 DIAGNOSIS — L309 Dermatitis, unspecified: Secondary | ICD-10-CM | POA: Diagnosis not present

## 2021-06-25 DIAGNOSIS — F502 Bulimia nervosa: Secondary | ICD-10-CM | POA: Diagnosis not present

## 2021-06-25 DIAGNOSIS — F3131 Bipolar disorder, current episode depressed, mild: Secondary | ICD-10-CM | POA: Diagnosis not present

## 2021-06-25 DIAGNOSIS — Z114 Encounter for screening for human immunodeficiency virus [HIV]: Secondary | ICD-10-CM | POA: Diagnosis not present

## 2021-06-25 DIAGNOSIS — S30860A Insect bite (nonvenomous) of lower back and pelvis, initial encounter: Secondary | ICD-10-CM

## 2021-06-25 DIAGNOSIS — E559 Vitamin D deficiency, unspecified: Secondary | ICD-10-CM

## 2021-06-25 DIAGNOSIS — Z0000 Encounter for general adult medical examination without abnormal findings: Secondary | ICD-10-CM | POA: Diagnosis not present

## 2021-06-25 DIAGNOSIS — F429 Obsessive-compulsive disorder, unspecified: Secondary | ICD-10-CM | POA: Diagnosis not present

## 2021-06-25 DIAGNOSIS — F4310 Post-traumatic stress disorder, unspecified: Secondary | ICD-10-CM | POA: Diagnosis not present

## 2021-06-25 DIAGNOSIS — W57XXXA Bitten or stung by nonvenomous insect and other nonvenomous arthropods, initial encounter: Secondary | ICD-10-CM

## 2021-06-25 MED ORDER — TRIAMCINOLONE ACETONIDE 0.1 % EX CREA: 1.0000 "application " | TOPICAL_CREAM | Freq: Two times a day (BID) | CUTANEOUS | 0 refills | Status: DC

## 2021-06-25 NOTE — Patient Instructions (Signed)
Please use Kenalog cream over back rash to help with itching.  Please get fasting blood tests done before the next visit.

## 2021-06-26 NOTE — Progress Notes (Signed)
Acute Office Visit  Subjective:    Patient ID: SALAYA HOLTROP, female    DOB: [birth year], 56 y.o.   MRN: [id number]  Chief Complaint  Patient presents with   Tick Removal    Pt had tick bite on back 06-04-21 she has two places on back was taking doxycyline for her eye over the last two weeks so maybe this has helped just wants to make sure its ok     HPI Patient is in today for evaluation of tick bites over back on 05/17.  She has had 2 tick bites over upper back and mid back area.  Of note, she was prescribed doxycycline for eyelid infection after the tick bite.  She denies any fever, chills, fatigue or myalgias currently.  She has had redness, mild swelling and intense itching over the tick bite area.  Past Medical History:  Diagnosis Date   Abdominal pain, epigastric 10/07/2018   Acute non-recurrent maxillary sinusitis 11/07/2019   Anxiety    Arthritis    Binge-eating and purging type anorexia nervosa    Bipolar affective disorder ([place]) 12/04/2015   Bipolar affective disorder, current episode manic without psychotic symptoms ([place]) 12/04/2015   Bipolar disorder ([place])    Cellulitis and abscess of buttock 02/15/2013   Cystitis, interstitial    Depression    Depression    Phreesia 01/28/2020   Esophageal polyp    about 20 years ago   GERD (gastroesophageal reflux disease)    Left wrist pain 03/22/2019   OCD (obsessive compulsive disorder)    Palpitations 05/25/2014   Rectal pain 12/06/2018   Rib pain on right side 01/31/2019   Rosacea 03/27/2019   Sprain of temporomandibular joint or ligament 01/10/2019    Past Surgical History:  Procedure Laterality Date   BLADDER SURGERY     CHOLECYSTECTOMY     COLONOSCOPY WITH PROPOFOL N/A 10/05/2019   Procedure: COLONOSCOPY WITH PROPOFOL;  Surgeon: Daneil Dolin, MD;  Location: [facility];  Service: Endoscopy;  Laterality: N/A;  12:45pm   COSMETIC SURGERY N/A    Phreesia 01/28/2020   ESOPHAGOGASTRODUODENOSCOPY  09/28/2016    [facility]; Dr. Therisa Doyne; erosions in the esophagus, 5 cm hiatal hernia, nonbleeding erosive gastropathy s/p biopsied, normal duodenum.  Path with chronic inactive gastritis, no H. pylori or intestinal metaplasia.    ESOPHAGOGASTRODUODENOSCOPY (EGD) WITH PROPOFOL N/A 10/17/2018   Dr. Gala Romney: mild reflux esophagitis, small hiatal hernia   POLYPECTOMY  10/05/2019   Procedure: POLYPECTOMY;  Surgeon: Daneil Dolin, MD;  Location: [facility];  Service: Endoscopy;;   VOCAL CORD LATERALIZATION, ENDOSCOPIC APPROACH W/ MLB      Family History  Problem Relation Age of Onset   Healthy Mother    Healthy Father    Colon cancer Neg Hx    Colon polyps Neg Hx     Social History   Socioeconomic History   Marital status: Married    Spouse name: Todd   Number of children: 0   Years of education: Not on file   Highest education level: Not on file  Occupational History   Not on file  Tobacco Use   Smoking status: Every Day    Packs/day: 0.50    Years: 20.00    Total pack years: 10.00    Types: Cigarettes    Start date: 01/20/1988   Smokeless tobacco: Never  Vaping Use   Vaping Use: Never used  Substance and Sexual Activity   Alcohol use: No  Alcohol/week: 0.0 standard drinks of alcohol   Drug use: No   Sexual activity: Yes    Birth control/protection: None  Other Topics Concern   Not on file  Social History Narrative   Lives with husband -[name] of 15 years       Yorkie- Max      Enjoys: reading-all genres      Diet: eats all food groups   Caffeine: 1 cup daily at times, diet dr pepper daily   Water: gatorade  zero and water       Wears seat belt    Does not use phone while driving   Smoke and Development worker, 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         Social Determinants of Health   Financial Resource Strain: Low Risk  (10/11/2020)   Overall Financial Resource Strain (CARDIA)    Difficulty of Paying Living Expenses: Not hard at all  Food Insecurity: No Food  Insecurity (10/11/2020)   Hunger Vital Sign    Worried About Running Out of Food in the Last Year: Never true    [place] in the Last Year: Never true  Transportation Needs: No Transportation Needs (10/11/2020)   PRAPARE - Hydrologist (Medical): No    Lack of Transportation (Non-Medical): No  Physical Activity: Insufficiently Active (10/11/2020)   Exercise Vital Sign    Days of Exercise per Week: 7 days    Minutes of Exercise per Session: 20 min  Stress: Stress Concern Present (10/11/2020)   [place]    Feeling of Stress : To some extent  Social Connections: Moderately Integrated (10/11/2020)   Social Connection and Isolation Panel [NHANES]    Frequency of Communication with Friends and Family: More than three times a week    Frequency of Social Gatherings with Friends and Family: Once a week    Attends Religious Services: More than 4 times per year    Active Member of [employer] or Organizations: No    Attends Archivist Meetings: Never    Marital Status: Married  Human resources officer Violence: Unknown (10/11/2020)   Humiliation, Afraid, Rape, and Kick questionnaire    Fear of Current or Ex-Partner: No    Emotionally Abused: No    Physically Abused: No    Sexually Abused: Not on file    Outpatient Medications Prior to Visit  Medication Sig Dispense Refill   Brexpiprazole ([name]) 4 MG TABS Take 4 mg by mouth in the morning. 30 tablet 11   clonazePAM (KLONOPIN) 0.5 MG tablet Take 1 tablet (0.5 mg total) by mouth 3 (three) times daily as needed for anxiety. (Patient taking differently: Take 0.5 mg by mouth 3 (three) times daily as needed for anxiety. 1/2 tid prn. Not taking as often now that on mirtazepine for akathisia) 30 tablet 5   cromolyn (OPTICROM) 4 % ophthalmic solution Place 1 drop into both eyes 2 (two) times daily.     diclofenac Sodium (VOLTAREN) 1 % GEL Apply 4 g  topically 4 (four) times daily. 50 g 0   dicyclomine (BENTYL) 20 MG tablet TAKE ONE TABLET BY MOUTH UP TO THREE TIMES A DAY AS NEEDED FOR STOMACH CRAMPING 30 tablet 1   Famotidine-Ca Carb-Mag Hydrox (PEPCID COMPLETE PO) Take 1 tablet by mouth daily.     fluticasone (FLONASE) 50 MCG/ACT nasal spray Place 2 sprays into both nostrils daily.  16 g 6   fluvoxaMINE (LUVOX) 100 MG tablet Take 3 tablets (300 mg total) by mouth at bedtime. 270 tablet 3   gabapentin (NEURONTIN) 600 MG tablet 2 p.o. every morning, 1 p.o. q. afternoon, 2 p.o. nightly. 150 tablet 5   hydrOXYzine (ATARAX) 25 MG tablet Take 1-2 tablets (25-50 mg total) by mouth every 8 (eight) hours as needed. 90 tablet 11   loperamide (IMODIUM) 2 MG capsule Take 2 mg by mouth as needed for diarrhea or loose stools.     loratadine (CLARITIN) 10 MG tablet Take 5 mg by mouth daily as needed for allergies.      mirtazapine (REMERON) 7.5 MG tablet Take 1 tablet (7.5 mg total) by mouth at bedtime. 30 tablet 1   naproxen (NAPROSYN) 500 MG tablet Take 500 mg by mouth 2 (two) times daily as needed for migraine.     ondansetron (ZOFRAN-ODT) 4 MG disintegrating tablet Take 1 tablet (4 mg total) by mouth every 8 (eight) hours as needed for nausea or vomiting. 30 tablet 1   pramipexole (MIRAPEX) 0.5 MG tablet Take 1 tablet (0.5 mg total) by mouth at bedtime. 90 tablet 3   prednisoLONE acetate (PRED FORTE) 1 % ophthalmic suspension Place 1 drop into both eyes in the morning and at bedtime.     RESTASIS 0.05 % ophthalmic emulsion 1 drop 2 (two) times daily.     Rimegepant Sulfate (NURTEC) 75 MG TBDP Take 75 mg by mouth daily as needed (take for abortive therapy of migraine, no more than 1 tablet in 24 hours or 10 per month). 8 tablet 11   valACYclovir (VALTREX) 500 MG tablet Take 500 mg by mouth daily as needed (Flair up take Twice a day  for 3 days and stop).      betamethasone dipropionate 0.05 % cream Apply topically 2 (two) times daily. 30 g 0   No  facility-administered medications prior to visit.    Allergies  Allergen Reactions   Codeine Shortness Of Breath, Nausea Only and Rash   Penicillins Hives, Shortness Of Breath, Swelling and Rash    Has patient had a PCN reaction causing immediate rash, facial/tongue/throat swelling, SOB or lightheadedness with hypotension: yes Has patient had a PCN reaction causing severe rash involving mucus membranes or skin necrosis: no Has patient had a PCN reaction that required hospitalization: no Has patient had a PCN reaction occurring within the last 10 years: no If all of the above answers are "NO", then may proceed with Cephalosporin use.;      Divalproex Sodium Hives, Itching and Rash   Meloxicam Other (See Comments)    Possible chest tightness - instructed by MD not to take   Sonata [Zaleplon] Other (See Comments)    Hallucinations   Topamax [Topiramate] Other (See Comments)    Low BP and dizziness   Aciphex [Rabeprazole] Swelling   Aimovig [Erenumab-Aooe] Itching   Galcanezumab-Gnlm Rash and Hives    Review of Systems  Constitutional:  Negative for chills and fever.  HENT:  Negative for congestion, sinus pressure and sinus pain.   Respiratory:  Negative for cough and shortness of breath.   Cardiovascular:  Negative for chest pain and palpitations.  Gastrointestinal:  Negative for constipation, diarrhea, nausea and vomiting.  Genitourinary:  Negative for dysuria and hematuria.  Musculoskeletal:  Positive for arthralgias (Left hip). Negative for neck pain and neck stiffness.  Skin:  Positive for rash.  Neurological:  Negative for dizziness and weakness.  Psychiatric/Behavioral:  Negative for  agitation and behavioral problems.        Objective:    Physical Exam Constitutional:      General: She is not in acute distress.    Appearance: She is not diaphoretic.  Eyes:     General: No scleral icterus.    Extraocular Movements: Extraocular movements intact.  Cardiovascular:      Rate and Rhythm: Normal rate and regular rhythm.     Pulses: Normal pulses.     Heart sounds: Normal heart sounds. No murmur heard. Pulmonary:     Breath sounds: Normal breath sounds. No wheezing or rales.  Musculoskeletal:     Comments: ROM at left hip mildly painful  Skin:    General: Skin is warm.     Findings: Rash (Erythematous patches over back area with scabbing) present.  Neurological:     General: No focal deficit present.     Mental Status: She is alert and oriented to person, place, and time. Mental status is at baseline.  Psychiatric:        Mood and Affect: Mood normal.        Behavior: Behavior normal.     BP 120/84 (BP Location: Left Arm, Patient Position: Sitting, Cuff Size: Normal)   Pulse 75   Resp 16   Ht '5\' 4"'$  (1.626 m)   Wt 177 lb 9.6 oz (80.6 kg)   LMP 10/07/2011   SpO2 98%   BMI 30.48 kg/m  Wt Readings from Last 3 Encounters:  06/25/21 177 lb 9.6 oz (80.6 kg)  05/01/21 173 lb (78.5 kg)  04/21/21 178 lb 6.4 oz (80.9 kg)        Assessment & Plan:   Problem List Items Addressed This Visit       Musculoskeletal and Integument   Tick bite of back - Primary    Has completed doxycycline after tick bite (was given for eyelid infection) Her rash does not seem to be erythema migrans Has localized itching and erythema, likely eczematous reaction - Kenalog cream prescribed.      Other Visit Diagnoses     Eczema, unspecified type       Relevant Medications   triamcinolone cream (KENALOG) 0.1 %   Encounter for screening for HIV       Relevant Orders   HIV antibody (with reflex)   Vitamin D deficiency       Relevant Orders   VITAMIN D 25 Hydroxy (Vit-D Deficiency, Fractures)    Meds ordered this encounter  Medications   triamcinolone cream (KENALOG) 0.1 %    Sig: Apply 1 application. topically 2 (two) times daily.    Dispense:  30 g    Refill:  0     Alias Villagran Keith Rake, MD

## 2021-06-26 NOTE — Assessment & Plan Note (Signed)
Has completed doxycycline after tick bite (was given for eyelid infection) Her rash does not seem to be erythema migrans Has localized itching and erythema, likely eczematous reaction - Kenalog cream prescribed.

## 2021-06-27 ENCOUNTER — Encounter: Payer: Self-pay | Admitting: *Deleted

## 2021-06-29 IMAGING — MR MRI OF THE LEFT KNEE WITHOUT CONTRAST
4 of 7 series · 14 of 40 positions shown · non-contrast
Comparison: July 08, 2018

CLINICAL DATA: Left knee pain for 2 months

EXAM:
MRI OF THE LEFT KNEE WITHOUT CONTRAST
TECHNIQUE: Multiplanar, multisequence MR imaging of the knee was performed. No
intravenous contrast was administered.

[Series 3: T2 fat-sat · axial · 4.0mm · 0.24mm/px · z∈[-67,+28]mm · 3 of 24 slices shown]
[im 5/24]
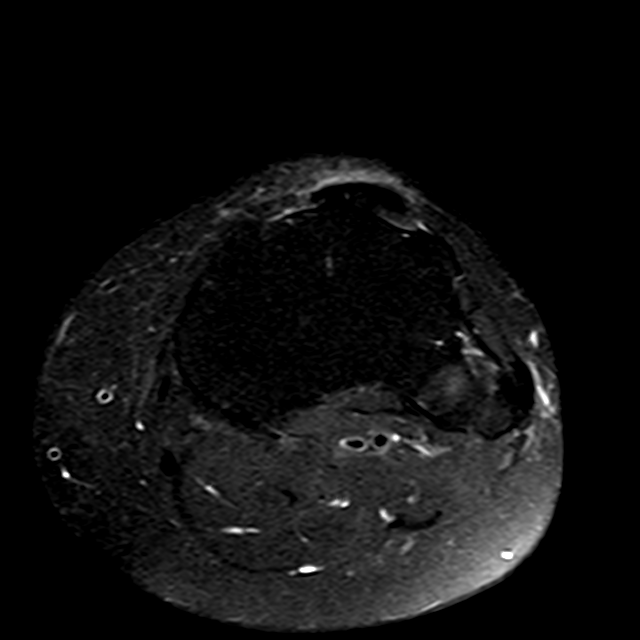
[im 14/24]
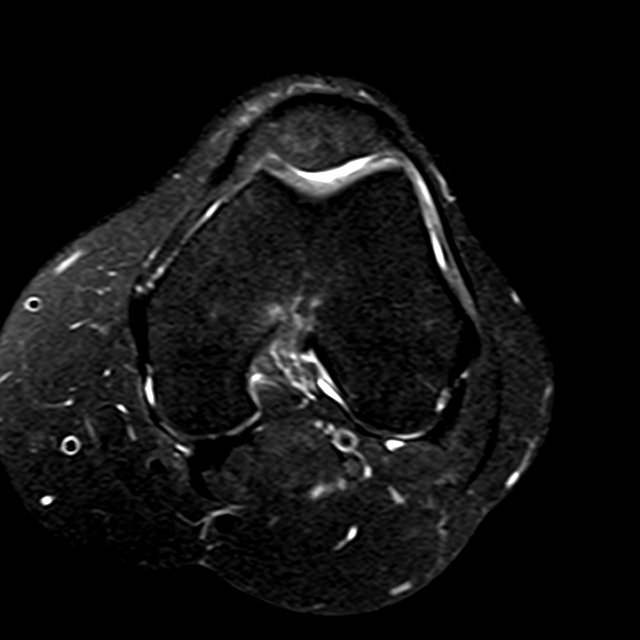
[im 24/24]
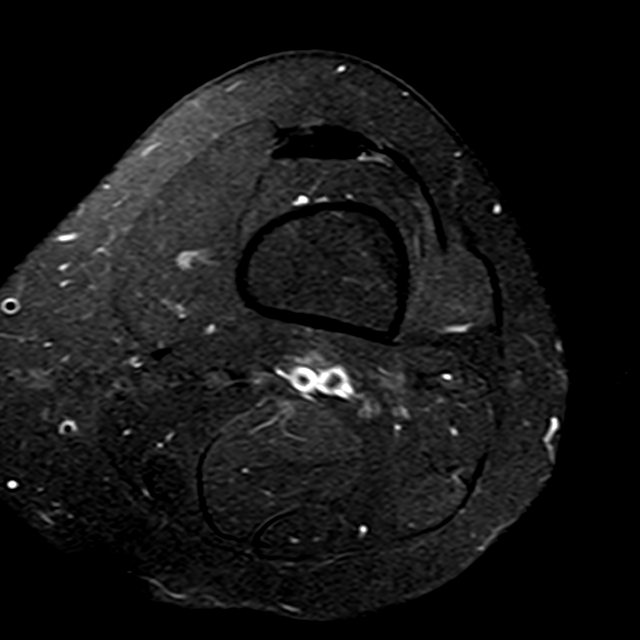

[Series 6: PD fat-sat · coronal · 4.0mm · 0.22mm/px · 5 of 28 slices shown (1 of 3)]
[im 1/28]
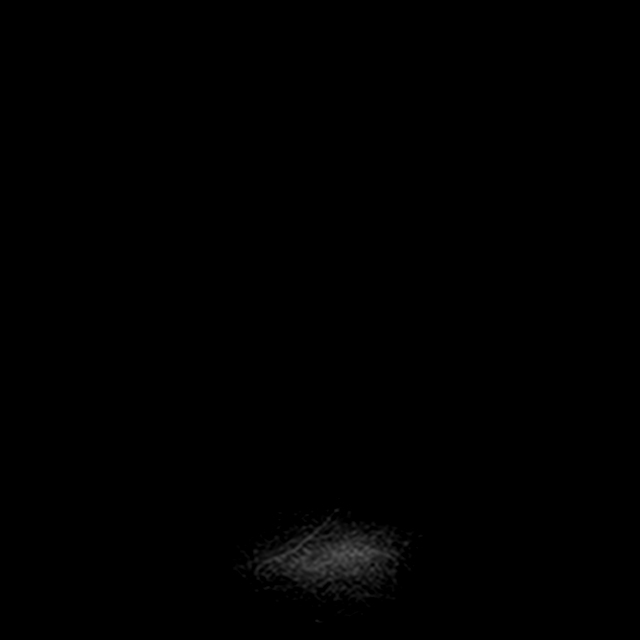
[im 6/28]
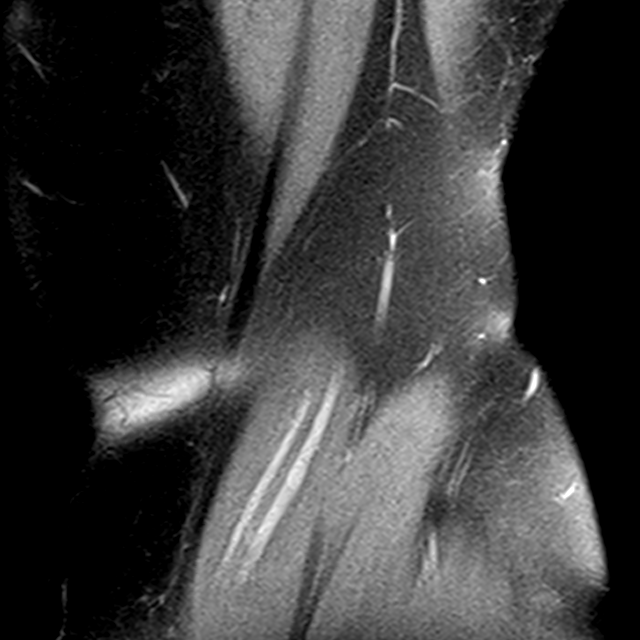
[im 11/28]
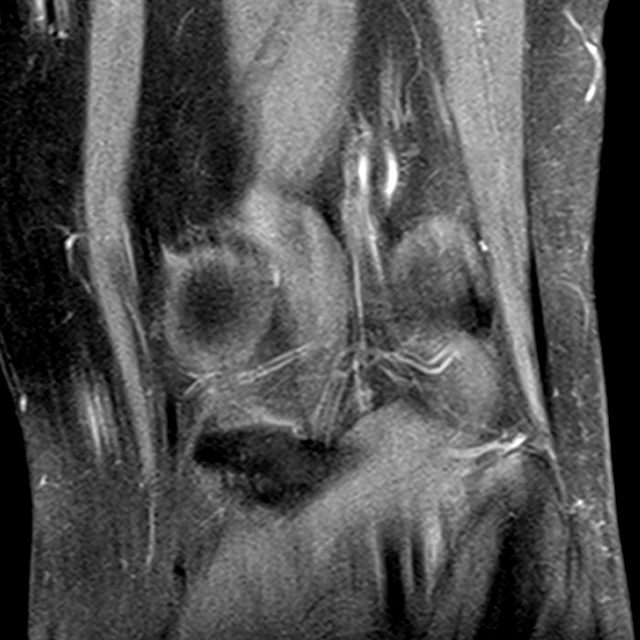
[im 17/28]
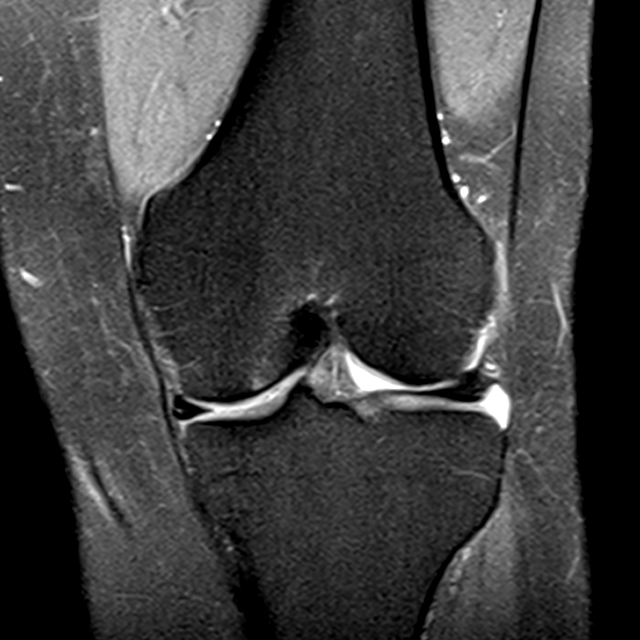
[im 28/28]
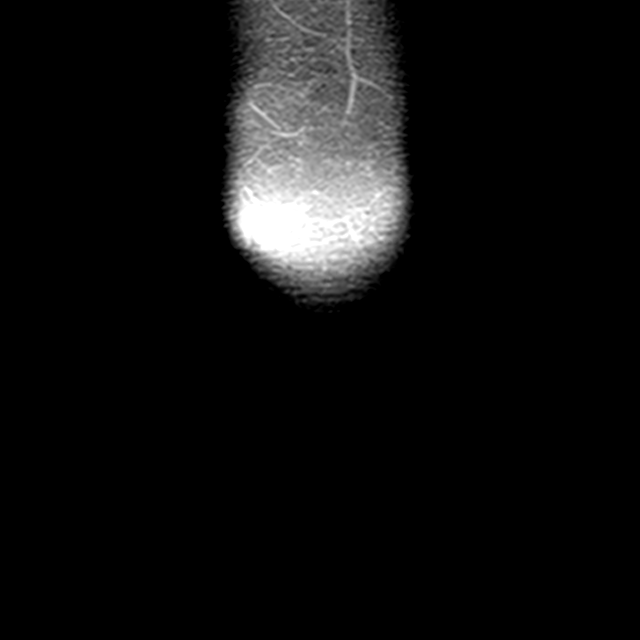

[Series 7: PD fat-sat · sagittal · 3.0mm · 0.21mm/px · 3 of 29 slices shown (2 of 3)]
[im 6/29]
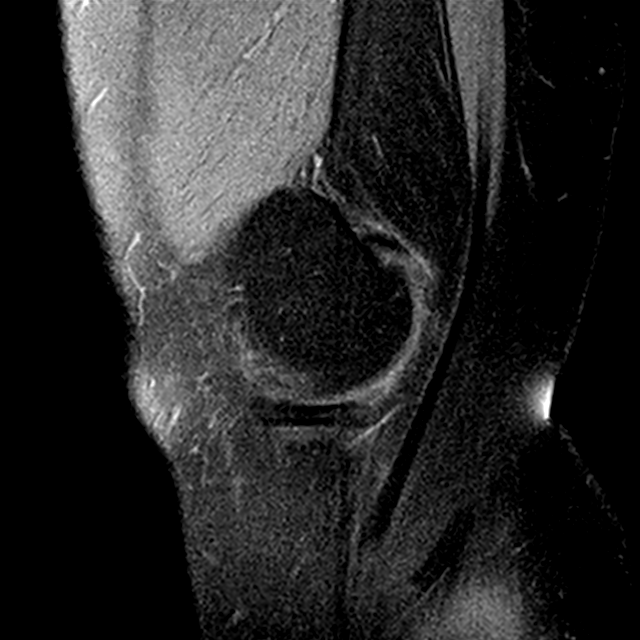
[im 17/29]
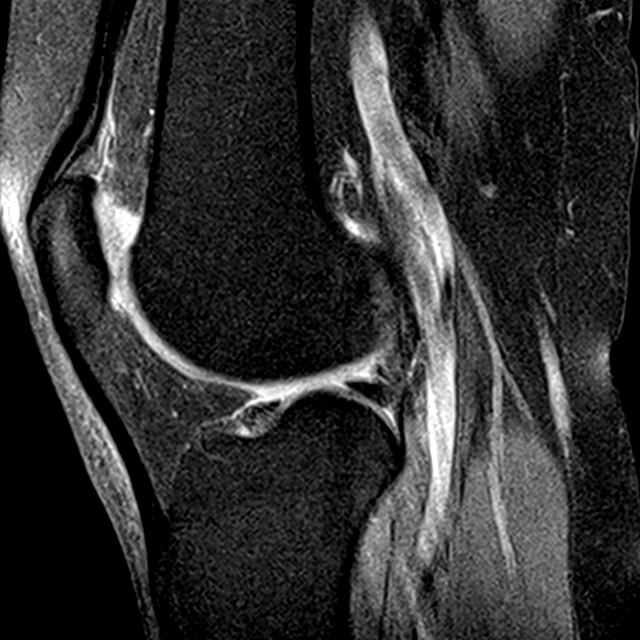
[im 29/29]
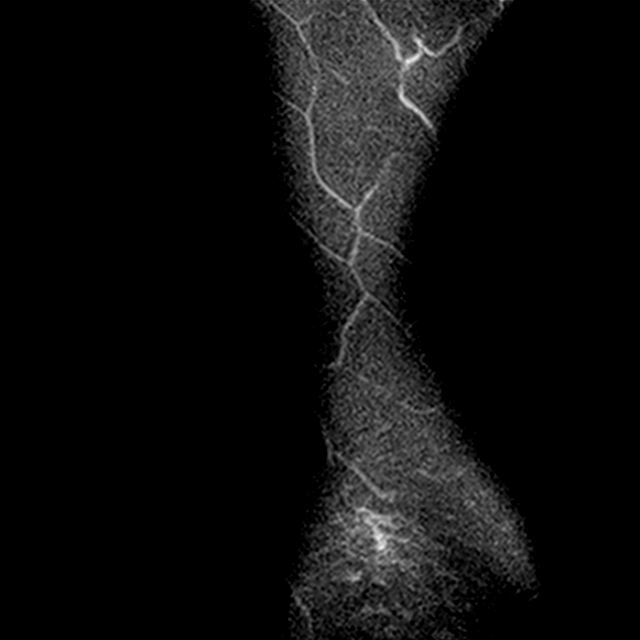

[Series 9: PD fat-sat · coronal · 2.0mm · 0.21mm/px · 3 of 15 slices shown (3 of 3)]
[im 1/15]
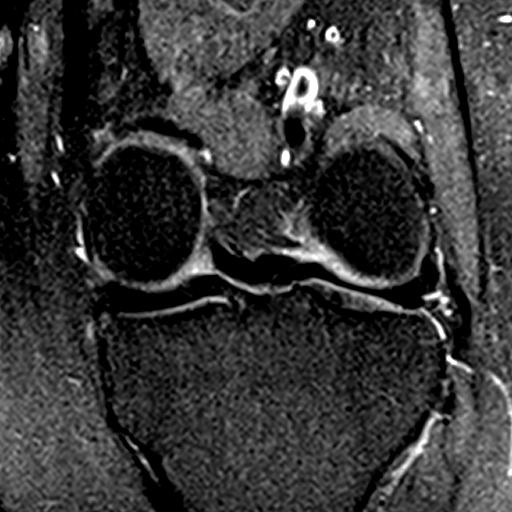
[im 8/15]
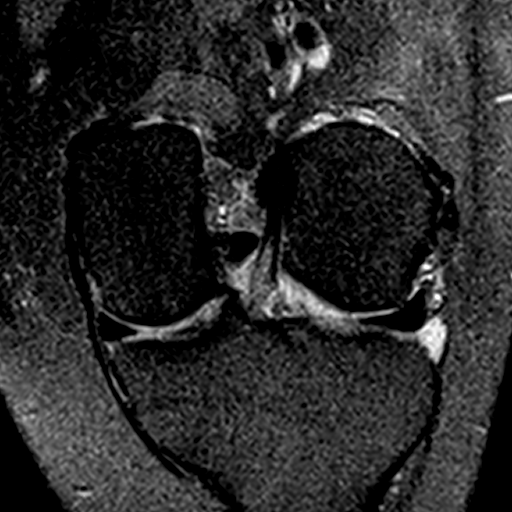
[im 15/15]
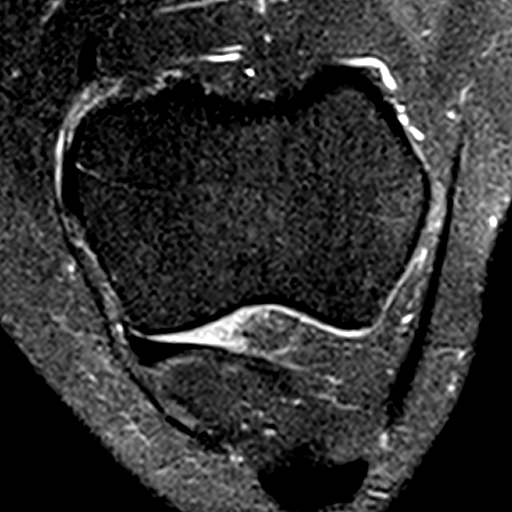

[14 of 40 positions shown; findings below may reference images not displayed]

FINDINGS: MENISCI

Medial: There is a nondisplaced horizontal longitudinal tear seen of
the posterior medial meniscus.

Lateral: Intact.

LIGAMENTS

Cruciates: The ACL is intact. The PCL is intact.

Collaterals: The MCL is intact. The lateral collateral ligamentous
complex is intact.

CARTILAGE

Patellofemoral: There is deep chondral fissuring seen within the
medial patellar facet with subchondral cystic changes.

Medial compartment: There is chondral fissuring seen in the
weight-bearing surface of the medial femoral condyle with
subchondral cystic changes.

Lateral compartment: Normal.

BONES: No fracture. No avascular necrosis. No pathologic marrow
infiltration.

JOINT: No joint effusion. Normal Ayisha Paola. No plical
thickening.

EXTENSOR MECHANISM: The patellar and quadriceps tendon are intact.
The retinaculum is unremarkable.

POPLITEAL FOSSA: No popliteal cyst.

OTHER:  The visualized muscles are normal in appearance.
IMPRESSION: 1. Nondisplaced tear of the posterior medial meniscus.
2. Intact cruciate ligaments.
3. Medial and patellofemoral compartment osteoarthritis with a focal
area of grade 3 chondromalacia in the superomedial patellar facet.

## 2021-06-30 ENCOUNTER — Other Ambulatory Visit: Payer: Self-pay | Admitting: Physician Assistant

## 2021-06-30 ENCOUNTER — Telehealth: Payer: Self-pay | Admitting: Physician Assistant

## 2021-06-30 MED ORDER — MIRTAZAPINE 15 MG PO TABS: 15.0000 mg | ORAL_TABLET | Freq: Every day | ORAL | 1 refills | Status: DC

## 2021-06-30 NOTE — Telephone Encounter (Signed)
Please let her know I sent in a prescription for mirtazapine 15 mg, so she will only take 1 per night but that is the equivalent of 2 of her current dose.  Thank you

## 2021-06-30 NOTE — Telephone Encounter (Signed)
Please review

## 2021-06-30 NOTE — Telephone Encounter (Signed)
Pt informed

## 2021-06-30 NOTE — Telephone Encounter (Signed)
Pt LVM @ 9:58a/  Helene Kelp told her at her last visit if she is feeling more depressed than ususal, she could double up on the dose of Mirtazapine.  She said for the last 2 nights has doubled up and feels much better during the day.  She would like a new script sent in to [facility].  Next appt 6/20

## 2021-07-01 ENCOUNTER — Telehealth: Payer: Self-pay | Admitting: Internal Medicine

## 2021-07-01 NOTE — Telephone Encounter (Signed)
Documented

## 2021-07-01 NOTE — Telephone Encounter (Signed)
Pt called to give the date of her last shingrix for [place] to put on file.   2nd shingrix 06/15/2019

## 2021-07-02 DIAGNOSIS — F429 Obsessive-compulsive disorder, unspecified: Secondary | ICD-10-CM | POA: Diagnosis not present

## 2021-07-02 DIAGNOSIS — F502 Bulimia nervosa: Secondary | ICD-10-CM | POA: Diagnosis not present

## 2021-07-02 DIAGNOSIS — F4310 Post-traumatic stress disorder, unspecified: Secondary | ICD-10-CM | POA: Diagnosis not present

## 2021-07-02 DIAGNOSIS — F319 Bipolar disorder, unspecified: Secondary | ICD-10-CM | POA: Diagnosis not present

## 2021-07-07 ENCOUNTER — Telehealth: Payer: Medicare PPO | Admitting: Physician Assistant

## 2021-07-08 ENCOUNTER — Encounter: Payer: Self-pay | Admitting: Physician Assistant

## 2021-07-08 ENCOUNTER — Telehealth (INDEPENDENT_AMBULATORY_CARE_PROVIDER_SITE_OTHER): Payer: Medicare PPO | Admitting: Physician Assistant

## 2021-07-08 DIAGNOSIS — F429 Obsessive-compulsive disorder, unspecified: Secondary | ICD-10-CM | POA: Diagnosis not present

## 2021-07-08 DIAGNOSIS — F502 Bulimia nervosa: Secondary | ICD-10-CM

## 2021-07-08 DIAGNOSIS — G4700 Insomnia, unspecified: Secondary | ICD-10-CM | POA: Diagnosis not present

## 2021-07-08 DIAGNOSIS — Z630 Problems in relationship with spouse or partner: Secondary | ICD-10-CM | POA: Diagnosis not present

## 2021-07-08 DIAGNOSIS — F4323 Adjustment disorder with mixed anxiety and depressed mood: Secondary | ICD-10-CM | POA: Diagnosis not present

## 2021-07-08 DIAGNOSIS — F319 Bipolar disorder, unspecified: Secondary | ICD-10-CM | POA: Diagnosis not present

## 2021-07-08 MED ORDER — FLUVOXAMINE MALEATE 100 MG PO TABS: 300.0000 mg | ORAL_TABLET | Freq: Every day | ORAL | 3 refills | Status: DC

## 2021-07-08 MED ORDER — MIRTAZAPINE 15 MG PO TABS: 15.0000 mg | ORAL_TABLET | Freq: Every day | ORAL | 3 refills | Status: DC

## 2021-07-08 NOTE — Progress Notes (Signed)
[facility]  Patient ID: Jennifer [name],  MRN: [id number]  PCP: Lindell Spar, MD  Date of Evaluation: 07/08/2021  time spent:20 minutes  Chief Complaint:  Chief Complaint   Anxiety; Depression; Insomnia; Follow-up    Virtual Visit via Telehealth  I connected with patient by a video enabled telemedicine application, with their informed consent, and verified patient privacy and that I am speaking with the correct person using two identifiers.  I am private, in my office and the patient is at home.  I discussed the limitations, risks, security and privacy concerns of performing an evaluation and management service by video and the availability of in person appointments. I also discussed with the patient that there may be a patient responsible charge related to this service. The patient expressed understanding and agreed to proceed.   I discussed the assessment and treatment plan with the patient. The patient was provided an opportunity to ask questions and all were answered. The patient agreed with the plan and demonstrated an understanding of the instructions.   The patient was advised to call back or seek an in-person evaluation if the symptoms worsen or if the condition fails to improve as anticipated.  I provided 20 minutes of non-face-to-face time during this encounter.  HISTORY/CURRENT STATUS: HPI For routine med check.  Cassie was seen about 3 weeks ago, was under a lot of stress due to a death in the family.  No changes in her medications were made at that time but she did call in the interim and requested increasing the mirtazapine for sleep.  We had discussed that.  After increasing it she has been falling asleep better but does not stay asleep still.  When going over her medications she stated she takes all of her meds at 1 time in the afternoon.  States she forgets or gets confused if she does not take them like that.  She does take the Klonopin and hydroxyzine  as needed though.  They are effective when needed.  Not taking either one very often.  She is not as anxious as she was.  Also not as depressed.  She is more able to enjoy things.  Energy and motivation are better.  Appetite is normal and weight is stable.  The mirtazapine has not increased her appetite.  She does continue to binge and purge sometimes but states it is not as bad as it has been in the past.  ADLs and personal hygiene are normal.  No cutting or any form of self-harm.  No suicidal or homicidal thoughts.  Patient denies increased energy with decreased need for sleep, no increased talkativeness, no racing thoughts, no impulsivity or risky behaviors, no increased spending, no increased libido, no grandiosity, no increased irritability or anger, no paranoia, and no hallucinations.  Denies dizziness, syncope, seizures, numbness, tingling, tremor, tics, unsteady gait, slurred speech, confusion.  No akathisia.  The mirtazapine has really helped that.  Has chronic joint pain. Denies unexplained weight loss, frequent infections, or sores that heal slowly.  No polyphagia, polydipsia, or polyuria. Denies visual changes or paresthesias.   Individual Medical History/ Review of Systems: Changes? :No          Past medications for mental health diagnoses include: Risperdal, Seroquel, Prozac, Zoloft,  Wellbutrin, Lamictal, Depakote caused hair loss, Xanax, Ambien, trazodone, Trileptal, Luvox, Topamax, Elavil, Pamelor, BuSpar, doxazosin, prazosin, lithium, Vraylar, Abilify, [name], Latuda >60 mg caused abd pain and nausea, propranolol caused diarrhea  Allergies: Codeine, Penicillins, Divalproex sodium,  Meloxicam, Sonata [zaleplon], Topamax [topiramate], Aciphex [rabeprazole], Aimovig [erenumab-aooe], and Galcanezumab-gnlm  Current Medications:  Current Outpatient Medications:    Brexpiprazole ([name]) 4 MG TABS, Take 4 mg by mouth in the morning., Disp: 30 tablet, Rfl: 11   clonazePAM (KLONOPIN) 0.5  MG tablet, Take 1 tablet (0.5 mg total) by mouth 3 (three) times daily as needed for anxiety. (Patient taking differently: Take 0.5 mg by mouth 3 (three) times daily as needed for anxiety. 1/2 tid prn. Not taking as often now that on mirtazepine for akathisia), Disp: 30 tablet, Rfl: 5   cromolyn (OPTICROM) 4 % ophthalmic solution, Place 1 drop into both eyes 2 (two) times daily., Disp: , Rfl:    diclofenac Sodium (VOLTAREN) 1 % GEL, Apply 4 g topically 4 (four) times daily., Disp: 50 g, Rfl: 0   dicyclomine (BENTYL) 20 MG tablet, TAKE ONE TABLET BY MOUTH UP TO THREE TIMES A DAY AS NEEDED FOR STOMACH CRAMPING, Disp: 30 tablet, Rfl: 1   Famotidine-Ca Carb-Mag Hydrox (PEPCID COMPLETE PO), Take 1 tablet by mouth daily., Disp: , Rfl:    fluticasone (FLONASE) 50 MCG/ACT nasal spray, Place 2 sprays into both nostrils daily., Disp: 16 g, Rfl: 6   gabapentin (NEURONTIN) 600 MG tablet, 2 p.o. every morning, 1 p.o. q. afternoon, 2 p.o. nightly., Disp: 150 tablet, Rfl: 5   hydrOXYzine (ATARAX) 25 MG tablet, Take 1-2 tablets (25-50 mg total) by mouth every 8 (eight) hours as needed., Disp: 90 tablet, Rfl: 11   loperamide (IMODIUM) 2 MG capsule, Take 2 mg by mouth as needed for diarrhea or loose stools., Disp: , Rfl:    loratadine (CLARITIN) 10 MG tablet, Take 5 mg by mouth daily as needed for allergies. , Disp: , Rfl:    naproxen (NAPROSYN) 500 MG tablet, Take 500 mg by mouth 2 (two) times daily as needed for migraine., Disp: , Rfl:    ondansetron (ZOFRAN-ODT) 4 MG disintegrating tablet, Take 1 tablet (4 mg total) by mouth every 8 (eight) hours as needed for nausea or vomiting., Disp: 30 tablet, Rfl: 1   pramipexole (MIRAPEX) 0.5 MG tablet, Take 1 tablet (0.5 mg total) by mouth at bedtime., Disp: 90 tablet, Rfl: 3   RESTASIS 0.05 % ophthalmic emulsion, 1 drop 2 (two) times daily., Disp: , Rfl:    Rimegepant Sulfate (NURTEC) 75 MG TBDP, Take 75 mg by mouth daily as needed (take for abortive therapy of migraine, no  more than 1 tablet in 24 hours or 10 per month)., Disp: 8 tablet, Rfl: 11   valACYclovir (VALTREX) 500 MG tablet, Take 500 mg by mouth daily as needed (Flair up take Twice a day  for 3 days and stop). , Disp: , Rfl:    fluvoxaMINE (LUVOX) 100 MG tablet, Take 3 tablets (300 mg total) by mouth at bedtime., Disp: 270 tablet, Rfl: 3   mirtazapine (REMERON) 15 MG tablet, Take 1 tablet (15 mg total) by mouth at bedtime., Disp: 90 tablet, Rfl: 3   prednisoLONE acetate (PRED FORTE) 1 % ophthalmic suspension, Place 1 drop into both eyes in the morning and at bedtime. (Patient not taking: Reported on 07/08/2021), Disp: , Rfl:    triamcinolone cream (KENALOG) 0.1 %, Apply 1 application. topically 2 (two) times daily. (Patient not taking: Reported on 07/08/2021), Disp: 30 g, Rfl: 0 Medication Side Effects: none  Family Medical/ Social History: Changes?  No  MENTAL HEALTH EXAM:  Last menstrual period 10/07/2011.There is no height or weight on file to calculate BMI.  General Appearance: Casual and Well Groomed  Eye Contact:  Good  Speech:  Clear and Coherent and Normal Rate  Volume:  Normal  Mood:  Euthymic  Affect:  Congruent  Thought Process:  Goal Directed and Descriptions of Associations: Intact  Orientation:  Full (Time, Place, and Person)  Thought Content: Logical   Suicidal Thoughts:  No  Homicidal Thoughts:  No  Memory:  WNL  Judgement:  Good  Insight:  Good  Psychomotor Activity:  Normal  Concentration:  Concentration: Good and Attention Span: Good  Recall:  Good  Fund of Knowledge: Good  Language: Good  Assets:  Desire for Improvement  ADL's:  Intact  Cognition: WNL  Prognosis:  Good    DIAGNOSES:    ICD-10-CM   1. Bipolar I disorder ([place])  F31.9     2. Situational mixed anxiety and depressive disorder  F43.23     3. Obsessive-compulsive disorder, unspecified type  F42.9     4. Insomnia, unspecified type  G47.00     5. Marital stress  Z63.0     6. Bulimia  F50.2        Receiving Psychotherapy: Yes [facility]  RECOMMENDATIONS:  PDMP was reviewed.  Last Klonopin filled 06/23/2021. I provided 20 minutes of non-face-to-face time during this encounter, including time spent before and after the visit in records review, medical decision making, counseling pertinent to today's visit, and charting.  I am glad to see her doing better. We discussed the way she is taking the medications.  She should not take everything all at the same time.  To help her stay on track I have asked her to set her alarm for 9 AM and then 8 PM and split the meds up like that.  She wrote down the instructions of taking [name] and gabapentin in the morning and then Luvox, gabapentin, and mirtazapine in the evening.  The way she has been taking her medications could be affecting her sleep.  The [name] might be giving her more energy and yet Luvox, gabapentin, and mirtazapine should help with sleep.  When taking all of them in the afternoon, she will get the benefit of any of them as far as the good side effects go.  She verbalizes understanding.  Continue [name] 4 mg, 1 p.o. every morning. Continue Klonopin 0.5 mg, 1 p.o. twice daily prn. Continue Luvox 100 mg, 3 p.o. nightly. Continue gabapentin 600 mg, 2 qam.,  3 p.o. nightly.  (I would prefer that she split these doses up more throughout the day but she has not been able to remember to do that.) Continue hydroxyzine 25-50 mg, 1 p.o. 3 times daily as needed.  Okay to take 1/2 pill with mirtazapine as needed. Continue mirtazapine 15 mg, 1 p.o. nightly  Continue pramipexole 0.5 mg nightly.  Per another provider. Continue therapy. Return in 2 months.  [name], PA-C

## 2021-07-09 DIAGNOSIS — F502 Bulimia nervosa: Secondary | ICD-10-CM | POA: Diagnosis not present

## 2021-07-09 DIAGNOSIS — F319 Bipolar disorder, unspecified: Secondary | ICD-10-CM | POA: Diagnosis not present

## 2021-07-09 DIAGNOSIS — F429 Obsessive-compulsive disorder, unspecified: Secondary | ICD-10-CM | POA: Diagnosis not present

## 2021-07-09 DIAGNOSIS — F4310 Post-traumatic stress disorder, unspecified: Secondary | ICD-10-CM | POA: Diagnosis not present

## 2021-07-16 DIAGNOSIS — F429 Obsessive-compulsive disorder, unspecified: Secondary | ICD-10-CM | POA: Diagnosis not present

## 2021-07-16 DIAGNOSIS — F502 Bulimia nervosa: Secondary | ICD-10-CM | POA: Diagnosis not present

## 2021-07-16 DIAGNOSIS — F319 Bipolar disorder, unspecified: Secondary | ICD-10-CM | POA: Diagnosis not present

## 2021-07-16 DIAGNOSIS — F4310 Post-traumatic stress disorder, unspecified: Secondary | ICD-10-CM | POA: Diagnosis not present

## 2021-07-23 DIAGNOSIS — F319 Bipolar disorder, unspecified: Secondary | ICD-10-CM | POA: Diagnosis not present

## 2021-07-23 DIAGNOSIS — F4310 Post-traumatic stress disorder, unspecified: Secondary | ICD-10-CM | POA: Diagnosis not present

## 2021-07-23 DIAGNOSIS — F429 Obsessive-compulsive disorder, unspecified: Secondary | ICD-10-CM | POA: Diagnosis not present

## 2021-07-23 DIAGNOSIS — F502 Bulimia nervosa: Secondary | ICD-10-CM | POA: Diagnosis not present

## 2021-07-30 DIAGNOSIS — F429 Obsessive-compulsive disorder, unspecified: Secondary | ICD-10-CM | POA: Diagnosis not present

## 2021-07-30 DIAGNOSIS — F319 Bipolar disorder, unspecified: Secondary | ICD-10-CM | POA: Diagnosis not present

## 2021-07-30 DIAGNOSIS — F502 Bulimia nervosa: Secondary | ICD-10-CM | POA: Diagnosis not present

## 2021-07-30 DIAGNOSIS — F4310 Post-traumatic stress disorder, unspecified: Secondary | ICD-10-CM | POA: Diagnosis not present

## 2021-07-31 ENCOUNTER — Encounter: Payer: Self-pay | Admitting: Internal Medicine

## 2021-07-31 ENCOUNTER — Ambulatory Visit (INDEPENDENT_AMBULATORY_CARE_PROVIDER_SITE_OTHER): Payer: Medicare PPO | Admitting: Internal Medicine

## 2021-07-31 DIAGNOSIS — K591 Functional diarrhea: Secondary | ICD-10-CM

## 2021-07-31 MED ORDER — DICYCLOMINE HCL 20 MG PO TABS: 20.0000 mg | ORAL_TABLET | Freq: Three times a day (TID) | ORAL | 1 refills | Status: DC | PRN

## 2021-07-31 NOTE — Patient Instructions (Signed)
Please take Bentyl as needed for abdominal cramping.  Please avoid lactose containing food.  Please keep food diary to identify food triggers of diarrhea or abdominal cramping.

## 2021-07-31 NOTE — Assessment & Plan Note (Signed)
Chronic loose BM Likely IBS-D Needs to take Bentyl as needed for abdominal cramping, may help with diarrhea as well If persistent, will add colestipol

## 2021-07-31 NOTE — Progress Notes (Signed)
Virtual Visit via Telephone Note   This visit type was conducted due to national recommendations for restrictions regarding the COVID-19 Pandemic (e.g. social distancing) in an effort to limit this patient's exposure and mitigate transmission in our community.  Due to her co-morbid illnesses, this patient is at least at moderate risk for complications without adequate follow up.  This format is felt to be most appropriate for this patient at this time.  The patient did not have access to video technology/had technical difficulties with video requiring transitioning to audio format only (telephone).  All issues noted in this document were discussed and addressed.  No physical exam could be performed with this format.  Evaluation Performed:  Follow-up visit  Date:  07/31/2021   ID:  Jennifer [name], Jennifer [name] [name], MRN [id number]  Patient Location: Home Provider Location: Office/Clinic  Participants: Patient Location of Patient: Home Location of Provider: Telehealth Consent was obtain for visit to be over via telehealth. I verified that I am speaking with the correct person using two identifiers.  PCP:  Lindell Spar, MD   Chief Complaint: Diarrhea  History of Present Illness:    Jennifer [name] [name] is a 56 y.o. female who has a televisit for c/o loose BM, which is chronic.  She had norovirus gastroenteritis in 04/23, but her diarrhea had improved after 2 weeks.  She was placed on Bentyl after GI visit for possible IBS-D, but has not taken it recently.  Imodium does not help much.  She used to take Levsin in the past for it.  She denies any melena or hematochezia currently.  Denies any fever, chills, nausea or vomiting.  The patient does not have symptoms concerning for COVID-19 infection (fever, chills, cough, or new shortness of breath).   Past Medical, Surgical, Social History, Allergies, and Medications have been Reviewed.  Past Medical History:  Diagnosis Date   Abdominal pain,  epigastric 10/07/2018   Acute non-recurrent maxillary sinusitis 11/07/2019   Anxiety    Arthritis    Binge-eating and purging type anorexia nervosa    Bipolar affective disorder ([place]) 12/04/2015   Bipolar affective disorder, current episode manic without psychotic symptoms ([place]) 12/04/2015   Bipolar disorder ([place])    Cellulitis and abscess of buttock 02/15/2013   Cystitis, interstitial    Depression    Depression    [employer]reesia 01/28/2020   Esophageal polyp    about 20 years ago   GERD (gastroesophageal reflux disease)    Left wrist pain 03/22/2019   OCD (obsessive compulsive disorder)    Palpitations 05/25/2014   Rectal pain 12/06/2018   Rib pain on right side 01/31/2019   Rosacea 03/27/2019   Sprain of temporomandibular joint or ligament 01/10/2019   Past Surgical History:  Procedure Laterality Date   BLADDER SURGERY     CHOLECYSTECTOMY     COLONOSCOPY WITH PROPOFOL N/A 10/05/2019   Procedure: COLONOSCOPY WITH PROPOFOL;  Surgeon: Daneil Dolin, MD;  [facility]: [facility];  Service: Endoscopy;  Laterality: N/A;  12:45pm   COSMETIC SURGERY N/A    [employer]reesia 01/28/2020   ESOPHAGOGASTRODUODENOSCOPY  09/28/2016   [facility]; Dr. Therisa Doyne; erosions in the esophagus, 5 cm hiatal hernia, nonbleeding erosive gastropathy s/p biopsied, normal duodenum.  Path with chronic inactive gastritis, no H. pylori or intestinal metaplasia.    ESOPHAGOGASTRODUODENOSCOPY (EGD) WITH PROPOFOL N/A 10/17/2018   Dr. Gala Romney: mild reflux esophagitis, small hiatal hernia   POLYPECTOMY  10/05/2019   Procedure: POLYPECTOMY;  Surgeon: Manus Rudd  M, MD;  Location: [facility];  Service: Endoscopy;;   VOCAL CORD LATERALIZATION, ENDOSCOPIC APPROACH W/ MLB       Current Meds  Medication Sig   Brexpiprazole ([name]) 4 MG TABS Take 4 mg by mouth in the morning.   clonazePAM (KLONOPIN) 0.5 MG tablet Take 1 tablet (0.5 mg total) by mouth 3 (three) times daily as needed for anxiety. (Patient taking differently: Take  0.5 mg by mouth 3 (three) times daily as needed for anxiety. 1/2 tid prn. Not taking as often now that on mirtazepine for akathisia)   cromolyn (OPTICROM) 4 % ophthalmic solution Place 1 drop into both eyes 2 (two) times daily.   diclofenac Sodium (VOLTAREN) 1 % GEL Apply 4 g topically 4 (four) times daily.   dicyclomine (BENTYL) 20 MG tablet TAKE ONE TABLET BY MOUTH UP TO THREE TIMES A DAY AS NEEDED FOR STOMACH CRAMPING   Famotidine-Ca Carb-Mag Hydrox (PEPCID COMPLETE PO) Take 1 tablet by mouth daily.   fluticasone (FLONASE) 50 MCG/ACT nasal spray Place 2 sprays into both nostrils daily.   fluvoxaMINE (LUVOX) 100 MG tablet Take 3 tablets (300 mg total) by mouth at bedtime.   gabapentin (NEURONTIN) 600 MG tablet 2 p.o. every morning, 1 p.o. q. afternoon, 2 p.o. nightly.   hydrOXYzine (ATARAX) 25 MG tablet Take 1-2 tablets (25-50 mg total) by mouth every 8 (eight) hours as needed.   loperamide (IMODIUM) 2 MG capsule Take 2 mg by mouth as needed for diarrhea or loose stools.   loratadine (CLARITIN) 10 MG tablet Take 5 mg by mouth daily as needed for allergies.    mirtazapine (REMERON) 15 MG tablet Take 1 tablet (15 mg total) by mouth at bedtime.   naproxen (NAPROSYN) 500 MG tablet Take 500 mg by mouth 2 (two) times daily as needed for migraine.   ondansetron (ZOFRAN-ODT) 4 MG disintegrating tablet Take 1 tablet (4 mg total) by mouth every 8 (eight) hours as needed for nausea or vomiting.   pramipexole (MIRAPEX) 0.5 MG tablet Take 1 tablet (0.5 mg total) by mouth at bedtime.   prednisoLONE acetate (PRED FORTE) 1 % ophthalmic suspension Place 1 drop into both eyes in the morning and at bedtime.   RESTASIS 0.05 % ophthalmic emulsion 1 drop 2 (two) times daily.   Rimegepant Sulfate (NURTEC) 75 MG TBDP Take 75 mg by mouth daily as needed (take for abortive therapy of migraine, no more than 1 tablet in 24 hours or 10 per month).   triamcinolone cream (KENALOG) 0.1 % Apply 1 application. topically 2 (two)  times daily.   valACYclovir (VALTREX) 500 MG tablet Take 500 mg by mouth daily as needed (Flair up take Twice a day  for 3 days and stop).      Allergies:   Codeine, Penicillins, Divalproex sodium, Meloxicam, Sonata [zaleplon], Topamax [topiramate], Aciphex [rabeprazole], Aimovig [erenumab-aooe], and Galcanezumab-gnlm   ROS:   Please see the history of present illness.     All other systems reviewed and are negative.   Labs/Other Tests and Data Reviewed:    Recent Labs: 11/25/2020: ALT 19; BUN 17; Creatinine, Ser 0.74; Hemoglobin 13.1; Platelets 134; Potassium 3.6; Sodium 140   Recent Lipid Panel Lab Results  Component Value Date/Time   CHOL 158 03/26/2020 08:17 AM   TRIG 65 03/26/2020 08:17 AM   HDL 55 03/26/2020 08:17 AM   CHOLHDL 2.9 03/26/2020 08:17 AM   CHOLHDL 2.8 12/06/2015 06:09 AM   LDLCALC 90 03/26/2020 08:17 AM    Wt  Readings from Last 3 Encounters:  06/25/21 177 lb 9.6 oz (80.6 kg)  05/01/21 173 lb (78.5 kg)  04/21/21 178 lb 6.4 oz (80.9 kg)     ASSESSMENT & PLAN:    Functional diarrhea Chronic loose BM Likely IBS-D Needs to take Bentyl as needed for abdominal cramping, may help with diarrhea as well If persistent, will add colestipol   Time:   Today, I have spent 9 minutes reviewing the chart, including problem list, medications, and with the patient with telehealth technology discussing the above problems.   Medication Adjustments/Labs and Tests Ordered: Current medicines are reviewed at length with the patient today.  Concerns regarding medicines are outlined above.   Tests Ordered: No orders of the defined types were placed in this encounter.   Medication Changes: No orders of the defined types were placed in this encounter.    Note: This dictation was prepared with Dragon dictation along with smaller phrase technology. Similar sounding words can be transcribed inadequately or may not be corrected upon review. Any transcriptional errors that  result from this process are unintentional.      Disposition:  Follow up  Signed, Lindell Spar, MD  07/31/2021 4:28 PM     [place]

## 2021-08-01 DIAGNOSIS — Z0000 Encounter for general adult medical examination without abnormal findings: Secondary | ICD-10-CM | POA: Diagnosis not present

## 2021-08-01 DIAGNOSIS — Z114 Encounter for screening for human immunodeficiency virus [HIV]: Secondary | ICD-10-CM | POA: Diagnosis not present

## 2021-08-01 DIAGNOSIS — E559 Vitamin D deficiency, unspecified: Secondary | ICD-10-CM | POA: Diagnosis not present

## 2021-08-02 LAB — CBC WITH DIFFERENTIAL/PLATELET
Basophils Absolute: 0.1 10*3/uL (ref 0.0–0.2)
Basos: 1 %
EOS (ABSOLUTE): 0.2 10*3/uL (ref 0.0–0.4)
Eos: 4 %
Hematocrit: 43.4 % (ref 34.0–46.6)
Hemoglobin: 14.6 g/dL (ref 11.1–15.9)
Immature Grans (Abs): 0 10*3/uL (ref 0.0–0.1)
Immature Granulocytes: 0 %
Lymphocytes Absolute: 2.6 10*3/uL (ref 0.7–3.1)
Lymphs: 39 %
MCH: 30.7 pg (ref 26.6–33.0)
MCHC: 33.6 g/dL (ref 31.5–35.7)
MCV: 91 fL (ref 79–97)
Monocytes Absolute: 0.7 10*3/uL (ref 0.1–0.9)
Monocytes: 11 %
Neutrophils Absolute: 3.1 10*3/uL (ref 1.4–7.0)
Neutrophils: 45 %
Platelets: 174 10*3/uL (ref 150–450)
RBC: 4.76 x10E6/uL (ref 3.77–5.28)
RDW: 12.9 % (ref 11.7–15.4)
WBC: 6.7 10*3/uL (ref 3.4–10.8)

## 2021-08-02 LAB — LIPID PANEL
Chol/HDL Ratio: 3 ratio (ref 0.0–4.4)
Cholesterol, Total: 160 mg/dL (ref 100–199)
HDL: 54 mg/dL (ref >39)
LDL Chol Calc (NIH): 86 mg/dL (ref 0–99)
Triglycerides: 110 mg/dL (ref 0–149)
VLDL Cholesterol Cal: 20 mg/dL (ref 5–40)

## 2021-08-02 LAB — CMP14+EGFR
ALT: 23 IU/L (ref 0–32)
AST: 33 IU/L (ref 0–40)
Albumin/Globulin Ratio: 1.7 (ref 1.2–2.2)
Albumin: 4.7 g/dL (ref 3.8–4.9)
Alkaline Phosphatase: 104 IU/L (ref 44–121)
BUN/Creatinine Ratio: 21 (ref 9–23)
BUN: 19 mg/dL (ref 6–24)
Bilirubin Total: 0.5 mg/dL (ref 0.0–1.2)
CO2: 18 mmol/L — ABNORMAL LOW (ref 20–29)
Calcium: 9.1 mg/dL (ref 8.7–10.2)
Chloride: 104 mmol/L (ref 96–106)
Creatinine, Ser: 0.91 mg/dL (ref 0.57–1.00)
Globulin, Total: 2.7 g/dL (ref 1.5–4.5)
Glucose: 104 mg/dL — ABNORMAL HIGH (ref 70–99)
Sodium: 147 mmol/L — ABNORMAL HIGH (ref 134–144)
Total Protein: 7.4 g/dL (ref 6.0–8.5)
eGFR: 75 mL/min/{1.73_m2} (ref >59)

## 2021-08-02 LAB — HEMOGLOBIN A1C
Est. average glucose Bld gHb Est-mCnc: 114 mg/dL
Hgb A1c MFr Bld: 5.6 % (ref 4.8–5.6)

## 2021-08-02 LAB — TSH: TSH: 2.18 u[IU]/mL (ref 0.450–4.500)

## 2021-08-02 LAB — VITAMIN D 25 HYDROXY (VIT D DEFICIENCY, FRACTURES): Vit D, 25-Hydroxy: 44.9 ng/mL (ref 30.0–100.0)

## 2021-08-02 LAB — HIV ANTIBODY (ROUTINE TESTING W REFLEX): HIV Screen 4th Generation wRfx: NONREACTIVE

## 2021-08-06 DIAGNOSIS — F502 Bulimia nervosa: Secondary | ICD-10-CM | POA: Diagnosis not present

## 2021-08-06 DIAGNOSIS — F4310 Post-traumatic stress disorder, unspecified: Secondary | ICD-10-CM | POA: Diagnosis not present

## 2021-08-06 DIAGNOSIS — F429 Obsessive-compulsive disorder, unspecified: Secondary | ICD-10-CM | POA: Diagnosis not present

## 2021-08-06 DIAGNOSIS — F319 Bipolar disorder, unspecified: Secondary | ICD-10-CM | POA: Diagnosis not present

## 2021-08-08 ENCOUNTER — Encounter: Payer: Self-pay | Admitting: Internal Medicine

## 2021-08-08 ENCOUNTER — Ambulatory Visit (INDEPENDENT_AMBULATORY_CARE_PROVIDER_SITE_OTHER): Payer: Medicare PPO | Admitting: Internal Medicine

## 2021-08-08 VITALS — BP 132/88 | HR 77 | Ht 64.0 in | Wt 182.8 lb

## 2021-08-08 DIAGNOSIS — Z0001 Encounter for general adult medical examination with abnormal findings: Secondary | ICD-10-CM

## 2021-08-08 DIAGNOSIS — G43909 Migraine, unspecified, not intractable, without status migrainosus: Secondary | ICD-10-CM | POA: Diagnosis not present

## 2021-08-08 DIAGNOSIS — R234 Changes in skin texture: Secondary | ICD-10-CM

## 2021-08-08 DIAGNOSIS — F3160 Bipolar disorder, current episode mixed, unspecified: Secondary | ICD-10-CM | POA: Diagnosis not present

## 2021-08-08 DIAGNOSIS — K529 Noninfective gastroenteritis and colitis, unspecified: Secondary | ICD-10-CM | POA: Diagnosis not present

## 2021-08-08 MED ORDER — CHOLESTYRAMINE 4 G PO PACK: 4.0000 g | PACK | Freq: Two times a day (BID) | ORAL | 12 refills | Status: DC

## 2021-08-08 NOTE — Assessment & Plan Note (Signed)
Followed by psychiatry On [name], Luvox, gabapentin and Remeron On Klonopin and Atarax as needed

## 2021-08-08 NOTE — Assessment & Plan Note (Signed)
Chronic, advised to apply lotion or moisturizer Avoid hard based shoes

## 2021-08-08 NOTE — Assessment & Plan Note (Signed)
Physical exam as documented. Fasting blood tests reviewed and discussed with the patient in detail. 

## 2021-08-08 NOTE — Patient Instructions (Signed)
Please start taking Questran as prescribed for diarrhea.  Please continue to take other medications as prescribed.  Please follow low salt diet and keep legs elevated as tolerated for leg swelling.

## 2021-08-08 NOTE — Progress Notes (Signed)
Established Patient Office Visit  Subjective:  Patient ID: Jennifer [name], female    DOB: 04/06/1965  Age: 56 y.o. MRN: [id number]  CC:  Chief Complaint  Patient presents with   Annual Exam    Pt here for CPE, had labs done last week, c/o left ankle swelling and skin problem on her heel.     HPI Jennifer [name] is a 56 y.o. female with past medical history of migraine, peripheral neuropathy, OCD and bipolar disorder who presents for annual physical.  She still complains of chronic diarrhea-sometimes loose and sometimes watery.  She has some improvement with Bentyl, Benefiber and probiotic supplement.  She denies any fever or chills currently.  She has mild flank pain, but denies any dysuria or hematuria currently.  She has chronic nausea due to bulimia.  She is followed by psychiatry for history of OCD and bipolar disorder.  She denies any SI or HI currently.  She complains of left LE swelling since yesterday.  Denies any dyspnea, orthopnea or PND.  She has had leg swelling in the past as well.  She also reports heel creases on bilateral feet.  Denies any recent injury.  Past Medical History:  Diagnosis Date   Abdominal pain, epigastric 10/07/2018   Acute non-recurrent maxillary sinusitis 11/07/2019   Anxiety    Arthritis    Binge-eating and purging type anorexia nervosa    Bipolar affective disorder ([place]) 12/04/2015   Bipolar affective disorder, current episode manic without psychotic symptoms ([place]) 12/04/2015   Bipolar disorder ([place])    Cellulitis and abscess of buttock 02/15/2013   Cystitis, interstitial    Depression    Depression    Phreesia 01/28/2020   Esophageal polyp    about 20 years ago   GERD (gastroesophageal reflux disease)    Left wrist pain 03/22/2019   OCD (obsessive compulsive disorder)    Palpitations 05/25/2014   Rectal pain 12/06/2018   Rib pain on right side 01/31/2019   Rosacea 03/27/2019   Sprain of temporomandibular joint or ligament 01/10/2019     Past Surgical History:  Procedure Laterality Date   BLADDER SURGERY     CHOLECYSTECTOMY     COLONOSCOPY WITH PROPOFOL N/A 10/05/2019   Procedure: COLONOSCOPY WITH PROPOFOL;  Surgeon: Daneil Dolin, MD;  [facility];  Service: Endoscopy;  Laterality: N/A;  12:45pm   COSMETIC SURGERY N/A    Phreesia 01/28/2020   ESOPHAGOGASTRODUODENOSCOPY  09/28/2016   [facility]; Dr. Therisa Doyne; erosions in the esophagus, 5 cm hiatal hernia, nonbleeding erosive gastropathy s/p biopsied, normal duodenum.  Path with chronic inactive gastritis, no H. pylori or intestinal metaplasia.    ESOPHAGOGASTRODUODENOSCOPY (EGD) WITH PROPOFOL N/A 10/17/2018   Dr. Gala Romney: mild reflux esophagitis, small hiatal hernia   POLYPECTOMY  10/05/2019   Procedure: POLYPECTOMY;  Surgeon: Daneil Dolin, MD;  [facility];  Service: Endoscopy;;   VOCAL CORD LATERALIZATION, ENDOSCOPIC APPROACH W/ MLB      Family History  Problem Relation Age of Onset   Healthy Mother    Healthy Father    Colon cancer Neg Hx    Colon polyps Neg Hx     Social History   Socioeconomic History   Marital status: Married    Spouse name: [name]   Number of children: 0   Years of education: Not on file   Highest education level: Not on file  Occupational History   Not on file  Tobacco Use   Smoking status: Every  Day    Packs/day: 0.50    Years: 20.00    Total pack years: 10.00    Types: Cigarettes    Start date: 01/20/1988   Smokeless tobacco: Never  Vaping Use   Vaping Use: Never used  Substance and Sexual Activity   Alcohol use: No    Alcohol/week: 0.0 standard drinks of alcohol   Drug use: No   Sexual activity: Yes    Birth control/protection: None  Other Topics Concern   Not on file  Social History Narrative   Lives with husband -[name] of 66 years       Yorkie- Max      Enjoys: reading-all genres      Diet: eats all food groups   Caffeine: 1 cup daily at times, diet dr pepper daily   Water: gatorade  zero  and water       Wears seat belt    Does not use phone while driving   Smoke and Development worker, 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         Social Determinants of Health   Financial Resource Strain: Low Risk  (10/11/2020)   Overall Financial Resource Strain (CARDIA)    Difficulty of Paying Living Expenses: Not hard at all  Food Insecurity: No Food Insecurity (10/11/2020)   Hunger Vital Sign    Worried About Running Out of Food in the Last Year: Never true    Ran Out of Food in the Last Year: Never true  Transportation Needs: No Transportation Needs (10/11/2020)   PRAPARE - Hydrologist (Medical): No    Lack of Transportation (Non-Medical): No  Physical Activity: Insufficiently Active (10/11/2020)   Exercise Vital Sign    Days of Exercise per Week: 7 days    Minutes of Exercise per Session: 20 min  Stress: Stress Concern Present (10/11/2020)   [place]    Feeling of Stress : To some extent  Social Connections: Moderately Integrated (10/11/2020)   Social Connection and Isolation Panel [NHANES]    Frequency of Communication with Friends and Family: More than three times a week    Frequency of Social Gatherings with Friends and Family: Once a week    Attends Religious Services: More than 4 times per year    Active Member of [employer] or Organizations: No    Attends Archivist Meetings: Never    Marital Status: Married  Human resources officer Violence: Unknown (10/11/2020)   Humiliation, Afraid, Rape, and Kick questionnaire    Fear of Current or Ex-Partner: No    Emotionally Abused: No    Physically Abused: No    Sexually Abused: Not on file    Outpatient Medications Prior to Visit  Medication Sig Dispense Refill   Brexpiprazole ([name]) 4 MG TABS Take 4 mg by mouth in the morning. 30 tablet 11   clonazePAM (KLONOPIN) 0.5 MG tablet Take 1 tablet (0.5 mg total) by mouth  3 (three) times daily as needed for anxiety. (Patient taking differently: Take 0.5 mg by mouth 3 (three) times daily as needed for anxiety. 1/2 tid prn. Not taking as often now that on mirtazepine for akathisia) 30 tablet 5   cromolyn (OPTICROM) 4 % ophthalmic solution Place 1 drop into both eyes 2 (two) times daily.     diclofenac Sodium (VOLTAREN) 1 % GEL Apply 4 g topically 4 (four) times daily. 50 g 0  dicyclomine (BENTYL) 20 MG tablet Take 1 tablet (20 mg total) by mouth 3 (three) times daily as needed for spasms (Diarrhea). 30 tablet 1   Famotidine-Ca Carb-Mag Hydrox (PEPCID COMPLETE PO) Take 1 tablet by mouth daily.     fluticasone (FLONASE) 50 MCG/ACT nasal spray Place 2 sprays into both nostrils daily. 16 g 6   fluvoxaMINE (LUVOX) 100 MG tablet Take 3 tablets (300 mg total) by mouth at bedtime. 270 tablet 3   gabapentin (NEURONTIN) 600 MG tablet 2 p.o. every morning, 1 p.o. q. afternoon, 2 p.o. nightly. 150 tablet 5   hydrOXYzine (ATARAX) 25 MG tablet Take 1-2 tablets (25-50 mg total) by mouth every 8 (eight) hours as needed. 90 tablet 11   L-Lysine 500 MG TABS Take by mouth.     Lactobacillus-Inulin (CULTURELLE DIGESTIVE DAILY PO) Take by mouth.     loratadine (CLARITIN) 10 MG tablet Take 5 mg by mouth daily as needed for allergies.      mirtazapine (REMERON) 15 MG tablet Take 1 tablet (15 mg total) by mouth at bedtime. 90 tablet 3   naproxen (NAPROSYN) 500 MG tablet Take 500 mg by mouth 2 (two) times daily as needed for migraine.     ondansetron (ZOFRAN-ODT) 4 MG disintegrating tablet Take 1 tablet (4 mg total) by mouth every 8 (eight) hours as needed for nausea or vomiting. 30 tablet 1   pramipexole (MIRAPEX) 0.5 MG tablet Take 1 tablet (0.5 mg total) by mouth at bedtime. 90 tablet 3   prednisoLONE acetate (PRED FORTE) 1 % ophthalmic suspension Place 1 drop into both eyes in the morning and at bedtime.     Prenatal Vit-Fe Fumarate-FA (PRENATAL MULTIVITAMIN) TABS tablet Take 1 tablet by  mouth daily at 12 noon.     RESTASIS 0.05 % ophthalmic emulsion 1 drop 2 (two) times daily.     Rimegepant Sulfate (NURTEC) 75 MG TBDP Take 75 mg by mouth daily as needed (take for abortive therapy of migraine, no more than 1 tablet in 24 hours or 10 per month). 8 tablet 11   triamcinolone cream (KENALOG) 0.1 % Apply 1 application. topically 2 (two) times daily. 30 g 0   valACYclovir (VALTREX) 500 MG tablet Take 500 mg by mouth daily as needed (Flair up take Twice a day  for 3 days and stop).      Wheat Dextrin (BENEFIBER DRINK MIX PO) Take by mouth.     loperamide (IMODIUM) 2 MG capsule Take 2 mg by mouth as needed for diarrhea or loose stools. (Patient not taking: Reported on 08/08/2021)     No facility-administered medications prior to visit.    Allergies  Allergen Reactions   Codeine Shortness Of Breath, Nausea Only and Rash   Penicillins Hives, Shortness Of Breath, Swelling and Rash    Has patient had a PCN reaction causing immediate rash, facial/tongue/throat swelling, SOB or lightheadedness with hypotension: yes Has patient had a PCN reaction causing severe rash involving mucus membranes or skin necrosis: no Has patient had a PCN reaction that required hospitalization: no Has patient had a PCN reaction occurring within the last 10 years: no If all of the above answers are "NO", then may proceed with Cephalosporin use.;      Divalproex Sodium Hives, Itching and Rash   Meloxicam Other (See Comments)    Possible chest tightness - instructed by MD not to take   Sonata [Zaleplon] Other (See Comments)    Hallucinations   Topamax [Topiramate] Other (See Comments)  Low BP and dizziness   Aciphex [Rabeprazole] Swelling   Aimovig [Erenumab-Aooe] Itching   Galcanezumab-Gnlm Rash and Hives    ROS Review of Systems  Constitutional:  Negative for chills and fever.  HENT:  Negative for congestion, sinus pressure and sinus pain.   Respiratory:  Negative for cough and shortness of  breath.   Cardiovascular:  Negative for chest pain and palpitations.  Gastrointestinal:  Positive for diarrhea and nausea. Negative for constipation and vomiting.  Genitourinary:  Negative for dysuria and hematuria.  Musculoskeletal:  Positive for arthralgias (Left hip). Negative for neck pain and neck stiffness.  Skin:  Positive for rash.       B/l heel creases  Neurological:  Negative for dizziness and weakness.  Psychiatric/Behavioral:  Negative for agitation and behavioral problems.       Objective:    Physical Exam Constitutional:      General: She is not in acute distress.    Appearance: She is not diaphoretic.  Eyes:     General: No scleral icterus.    Extraocular Movements: Extraocular movements intact.  Cardiovascular:     Rate and Rhythm: Normal rate and regular rhythm.     Pulses: Normal pulses.     Heart sounds: Normal heart sounds. No murmur heard. Pulmonary:     Breath sounds: Normal breath sounds. No wheezing or rales.  Abdominal:     Palpations: Abdomen is soft.     Tenderness: There is abdominal tenderness (Mild, b/l flank area).  Skin:    General: Skin is warm.     Findings: Rash (Erythematous patches over back area) present.  Neurological:     General: No focal deficit present.     Mental Status: She is alert and oriented to person, place, and time. Mental status is at baseline.  Psychiatric:        Mood and Affect: Mood normal.        Behavior: Behavior normal.     BP 132/88 (BP Location: Left Arm)   Pulse 77   Ht 5' 4"  (1.626 m)   Wt 182 lb 12.8 oz (82.9 kg)   LMP 10/07/2011   SpO2 95%   BMI 31.38 kg/m  Wt Readings from Last 3 Encounters:  08/08/21 182 lb 12.8 oz (82.9 kg)  06/25/21 177 lb 9.6 oz (80.6 kg)  05/01/21 173 lb (78.5 kg)    Lab Results  Component Value Date   TSH 2.180 08/01/2021   Lab Results  Component Value Date   WBC 6.7 08/01/2021   HGB 14.6 08/01/2021   HCT 43.4 08/01/2021   MCV 91 08/01/2021   PLT 174 08/01/2021    Lab Results  Component Value Date   NA 147 (H) 08/01/2021   K CANCELED 08/01/2021   CO2 18 (L) 08/01/2021   GLUCOSE 104 (H) 08/01/2021   BUN 19 08/01/2021   CREATININE 0.91 08/01/2021   BILITOT 0.5 08/01/2021   ALKPHOS 104 08/01/2021   AST 33 08/01/2021   ALT 23 08/01/2021   PROT 7.4 08/01/2021   ALBUMIN 4.7 08/01/2021   CALCIUM 9.1 08/01/2021   ANIONGAP 6 11/25/2020   EGFR 75 08/01/2021   Lab Results  Component Value Date   CHOL 160 08/01/2021   Lab Results  Component Value Date   HDL 54 08/01/2021   Lab Results  Component Value Date   LDLCALC 86 08/01/2021   Lab Results  Component Value Date   TRIG 110 08/01/2021   Lab Results  Component Value Date  CHOLHDL 3.0 08/01/2021   Lab Results  Component Value Date   HGBA1C 5.6 08/01/2021      Assessment & Plan:   Problem List Items Addressed This Visit       Cardiovascular and Mediastinum   Migraine without status migrainosus, not intractable    On Nurtec Followed by neurology      Relevant Medications   cholestyramine (QUESTRAN) 4 g packet     Digestive   Chronic diarrhea    Chronic loose BM Likely IBS-D Needs to take Bentyl as needed for abdominal cramping, may help with diarrhea as well Persistent, will add colestipol Check stool GI profile to rule out infectious etiology      Relevant Medications   cholestyramine (QUESTRAN) 4 g packet   Other Relevant Orders   GI Profile, Stool, PCR     Musculoskeletal and Integument   Fissure in skin of foot    Chronic, advised to apply lotion or moisturizer Avoid hard based shoes        Other   Mixed bipolar I disorder ([place])    Followed by psychiatry On [name], Luvox, gabapentin and Remeron On Klonopin and Atarax as needed      Encounter for general adult medical examination with abnormal findings - Primary    Physical exam as documented. Fasting blood tests reviewed and discussed with the patient in detail.       Meds ordered this  encounter  Medications   cholestyramine (QUESTRAN) 4 g packet    Sig: Take 1 packet (4 g total) by mouth 2 (two) times daily.    Dispense:  60 each    Refill:  12    Follow-up: Return in about 6 months (around 02/08/2022).    Lindell Spar, MD

## 2021-08-08 NOTE — Assessment & Plan Note (Signed)
Chronic loose BM Likely IBS-D Needs to take Bentyl as needed for abdominal cramping, may help with diarrhea as well Persistent, will add colestipol Check stool GI profile to rule out infectious etiology

## 2021-08-08 NOTE — Assessment & Plan Note (Signed)
On Nurtec Followed by neurology 

## 2021-08-11 ENCOUNTER — Encounter: Payer: Self-pay | Admitting: *Deleted

## 2021-08-11 DIAGNOSIS — F429 Obsessive-compulsive disorder, unspecified: Secondary | ICD-10-CM | POA: Diagnosis not present

## 2021-08-11 DIAGNOSIS — F502 Bulimia nervosa: Secondary | ICD-10-CM | POA: Diagnosis not present

## 2021-08-11 DIAGNOSIS — F319 Bipolar disorder, unspecified: Secondary | ICD-10-CM | POA: Diagnosis not present

## 2021-08-11 DIAGNOSIS — F4310 Post-traumatic stress disorder, unspecified: Secondary | ICD-10-CM | POA: Diagnosis not present

## 2021-08-11 LAB — GI PROFILE, STOOL, PCR

## 2021-08-13 ENCOUNTER — Telehealth: Payer: Self-pay | Admitting: Internal Medicine

## 2021-08-13 DIAGNOSIS — F429 Obsessive-compulsive disorder, unspecified: Secondary | ICD-10-CM | POA: Diagnosis not present

## 2021-08-13 DIAGNOSIS — F319 Bipolar disorder, unspecified: Secondary | ICD-10-CM | POA: Diagnosis not present

## 2021-08-13 DIAGNOSIS — F502 Bulimia nervosa: Secondary | ICD-10-CM | POA: Diagnosis not present

## 2021-08-13 DIAGNOSIS — F4310 Post-traumatic stress disorder, unspecified: Secondary | ICD-10-CM | POA: Diagnosis not present

## 2021-08-13 NOTE — Telephone Encounter (Signed)
Patient called in regard to last visit 7/13  Patient states still experiencing symptoms even with new medication prescribed.  Patient has had vomiting today as well as diarrhea like water  Patient wants a call back

## 2021-08-13 NOTE — Telephone Encounter (Signed)
Patient will need an appt can be virtually it has been almost 2 weeks since last visit

## 2021-08-13 NOTE — Telephone Encounter (Signed)
Pt appt scheduled

## 2021-08-14 ENCOUNTER — Ambulatory Visit (INDEPENDENT_AMBULATORY_CARE_PROVIDER_SITE_OTHER): Payer: Medicare PPO | Admitting: Internal Medicine

## 2021-08-14 ENCOUNTER — Encounter: Payer: Self-pay | Admitting: Internal Medicine

## 2021-08-14 DIAGNOSIS — K529 Noninfective gastroenteritis and colitis, unspecified: Secondary | ICD-10-CM | POA: Diagnosis not present

## 2021-08-14 NOTE — Patient Instructions (Signed)
Please continue taking Questran, Benefiber and probiotic.  Please contact GI for chronic diarrhea.

## 2021-08-14 NOTE — Assessment & Plan Note (Signed)
Chronic loose BM Likely IBS-D Needs to take Bentyl as needed for abdominal cramping, may help with diarrhea as well Continue Benefiber and probiotic Persistent, better with colestipol Checked stool GI profile to rule out infectious etiology Advised to contact GI

## 2021-08-14 NOTE — Progress Notes (Signed)
Virtual Visit via Telephone Note   This visit type was conducted due to national recommendations for restrictions regarding the COVID-19 Pandemic (e.g. social distancing) in an effort to limit this patient's exposure and mitigate transmission in our community.  Due to her co-morbid illnesses, this patient is at least at moderate risk for complications without adequate follow up.  This format is felt to be most appropriate for this patient at this time.  The patient did not have access to video technology/had technical difficulties with video requiring transitioning to audio format only (telephone).  All issues noted in this document were discussed and addressed.  No physical exam could be performed with this format.  Evaluation Performed:  Follow-up visit  Date:  08/14/2021   ID:  Jennifer [name], Jennifer [name] 03-Nov-1965, MRN [id number]  Patient Location: Home Provider Location: Office/Clinic  Participants: Patient Location of Patient: Home Location of Provider: Telehealth Consent was obtain for visit to be over via telehealth. I verified that I am speaking with the correct person using two identifiers.  PCP:  Lindell Spar, MD   Chief Complaint: Diarrhea  History of Present Illness:    Jennifer [name] is a 56 y.o. female who has a televisit for c/o loose BM, which is chronic.  She had norovirus gastroenteritis in 04/23, but her diarrhea had improved after 2 weeks.  She was placed on Bentyl after GI visit for possible IBS-D, but has not taken it recently. She has seen some improvement in her diarrhea with Questran. She also take Benefiber and probiotic, but had missed her dose of Benefiber yesterday, which caused watery diarrhea. Imodium does not help much.  She used to take Levsin in the past for it.  She denies any melena or hematochezia currently.  Denies any fever, chills, nausea or vomiting.  The patient does not have symptoms concerning for COVID-19 infection (fever, chills, cough, or new  shortness of breath).   Past Medical, Surgical, Social History, Allergies, and Medications have been Reviewed.  Past Medical History:  Diagnosis Date   Abdominal pain, epigastric 10/07/2018   Acute non-recurrent maxillary sinusitis 11/07/2019   Anxiety    Arthritis    Binge-eating and purging type anorexia nervosa    Bipolar affective disorder ([place]) 12/04/2015   Bipolar affective disorder, current episode manic without psychotic symptoms ([place]) 12/04/2015   Bipolar disorder ([place])    Cellulitis and abscess of buttock 02/15/2013   Cystitis, interstitial    Depression    Depression    Phreesia 01/28/2020   Esophageal polyp    about 20 years ago   GERD (gastroesophageal reflux disease)    Left wrist pain 03/22/2019   OCD (obsessive compulsive disorder)    Palpitations 05/25/2014   Rectal pain 12/06/2018   Rib pain on right side 01/31/2019   Rosacea 03/27/2019   Sprain of temporomandibular joint or ligament 01/10/2019   Past Surgical History:  Procedure Laterality Date   BLADDER SURGERY     CHOLECYSTECTOMY     COLONOSCOPY WITH PROPOFOL N/A 10/05/2019   Procedure: COLONOSCOPY WITH PROPOFOL;  Surgeon: Daneil Dolin, MD;  [facility];  Service: Endoscopy;  Laterality: N/A;  12:45pm   COSMETIC SURGERY N/A    Phreesia 01/28/2020   ESOPHAGOGASTRODUODENOSCOPY  09/28/2016   [facility]; Dr. Therisa Doyne; erosions in the esophagus, 5 cm hiatal hernia, nonbleeding erosive gastropathy s/p biopsied, normal duodenum.  Path with chronic inactive gastritis, no H. pylori or intestinal metaplasia.    ESOPHAGOGASTRODUODENOSCOPY (EGD)  WITH PROPOFOL N/A 10/17/2018   Dr. Gala Romney: mild reflux esophagitis, small hiatal hernia   POLYPECTOMY  10/05/2019   Procedure: POLYPECTOMY;  Surgeon: Daneil Dolin, MD;  [facility];  Service: Endoscopy;;   VOCAL CORD LATERALIZATION, ENDOSCOPIC APPROACH W/ MLB       Current Meds  Medication Sig   Brexpiprazole ([name]) 4 MG TABS Take 4 mg by mouth  in the morning.   cholestyramine (QUESTRAN) 4 g packet Take 1 packet (4 g total) by mouth 2 (two) times daily.   clonazePAM (KLONOPIN) 0.5 MG tablet Take 1 tablet (0.5 mg total) by mouth 3 (three) times daily as needed for anxiety. (Patient taking differently: Take 0.5 mg by mouth 3 (three) times daily as needed for anxiety. 1/2 tid prn. Not taking as often now that on mirtazepine for akathisia)   cromolyn (OPTICROM) 4 % ophthalmic solution Place 1 drop into both eyes 2 (two) times daily.   diclofenac Sodium (VOLTAREN) 1 % GEL Apply 4 g topically 4 (four) times daily.   dicyclomine (BENTYL) 20 MG tablet Take 1 tablet (20 mg total) by mouth 3 (three) times daily as needed for spasms (Diarrhea).   Famotidine-Ca Carb-Mag Hydrox (PEPCID COMPLETE PO) Take 1 tablet by mouth daily.   fluticasone (FLONASE) 50 MCG/ACT nasal spray Place 2 sprays into both nostrils daily.   fluvoxaMINE (LUVOX) 100 MG tablet Take 3 tablets (300 mg total) by mouth at bedtime.   gabapentin (NEURONTIN) 600 MG tablet 2 p.o. every morning, 1 p.o. q. afternoon, 2 p.o. nightly.   hydrOXYzine (ATARAX) 25 MG tablet Take 1-2 tablets (25-50 mg total) by mouth every 8 (eight) hours as needed.   L-Lysine 500 MG TABS Take by mouth.   Lactobacillus-Inulin (CULTURELLE DIGESTIVE DAILY PO) Take by mouth.   loperamide (IMODIUM) 2 MG capsule Take 2 mg by mouth as needed for diarrhea or loose stools.   loratadine (CLARITIN) 10 MG tablet Take 5 mg by mouth daily as needed for allergies.    mirtazapine (REMERON) 15 MG tablet Take 1 tablet (15 mg total) by mouth at bedtime.   naproxen (NAPROSYN) 500 MG tablet Take 500 mg by mouth 2 (two) times daily as needed for migraine.   ondansetron (ZOFRAN-ODT) 4 MG disintegrating tablet Take 1 tablet (4 mg total) by mouth every 8 (eight) hours as needed for nausea or vomiting.   pramipexole (MIRAPEX) 0.5 MG tablet Take 1 tablet (0.5 mg total) by mouth at bedtime.   prednisoLONE acetate (PRED FORTE) 1 %  ophthalmic suspension Place 1 drop into both eyes in the morning and at bedtime.   Prenatal Vit-Fe Fumarate-FA (PRENATAL MULTIVITAMIN) TABS tablet Take 1 tablet by mouth daily at 12 noon.   RESTASIS 0.05 % ophthalmic emulsion 1 drop 2 (two) times daily.   Rimegepant Sulfate (NURTEC) 75 MG TBDP Take 75 mg by mouth daily as needed (take for abortive therapy of migraine, no more than 1 tablet in 24 hours or 10 per month).   triamcinolone cream (KENALOG) 0.1 % Apply 1 application. topically 2 (two) times daily.   valACYclovir (VALTREX) 500 MG tablet Take 500 mg by mouth daily as needed (Flair up take Twice a day  for 3 days and stop).    Wheat Dextrin (BENEFIBER DRINK MIX PO) Take by mouth.     Allergies:   Codeine, Penicillins, Divalproex sodium, Meloxicam, Sonata [zaleplon], Topamax [topiramate], Aciphex [rabeprazole], Aimovig [erenumab-aooe], and Galcanezumab-gnlm   ROS:   Please see the history of present illness.  All other systems reviewed and are negative.   Labs/Other Tests and Data Reviewed:    Recent Labs: 08/01/2021: ALT 23; BUN 19; Creatinine, Ser 0.91; Hemoglobin 14.6; Platelets 174; Potassium CANCELED; Sodium 147; TSH 2.180   Recent Lipid Panel Lab Results  Component Value Date/Time   CHOL 160 08/01/2021 08:16 AM   TRIG 110 08/01/2021 08:16 AM   HDL 54 08/01/2021 08:16 AM   CHOLHDL 3.0 08/01/2021 08:16 AM   CHOLHDL 2.8 12/06/2015 06:09 AM   LDLCALC 86 08/01/2021 08:16 AM    Wt Readings from Last 3 Encounters:  08/08/21 182 lb 12.8 oz (82.9 kg)  06/25/21 177 lb 9.6 oz (80.6 kg)  05/01/21 173 lb (78.5 kg)     ASSESSMENT & PLAN:    Chronic diarrhea Chronic loose BM Likely IBS-D Needs to take Bentyl as needed for abdominal cramping, may help with diarrhea as well Continue Benefiber and probiotic Persistent, better with colestipol Checked stool GI profile to rule out infectious etiology Advised to contact GI    Time:   Today, I have spent 9 minutes  reviewing the chart, including problem list, medications, and with the patient with telehealth technology discussing the above problems.   Medication Adjustments/Labs and Tests Ordered: Current medicines are reviewed at length with the patient today.  Concerns regarding medicines are outlined above.   Tests Ordered: No orders of the defined types were placed in this encounter.   Medication Changes: No orders of the defined types were placed in this encounter.    Note: This dictation was prepared with Dragon dictation along with smaller phrase technology. Similar sounding words can be transcribed inadequately or may not be corrected upon review. Any transcriptional errors that result from this process are unintentional.      Disposition:  Follow up  Signed, Lindell Spar, MD  08/14/2021 8:56 AM     [facility]

## 2021-08-20 DIAGNOSIS — F502 Bulimia nervosa: Secondary | ICD-10-CM | POA: Diagnosis not present

## 2021-08-20 DIAGNOSIS — F319 Bipolar disorder, unspecified: Secondary | ICD-10-CM | POA: Diagnosis not present

## 2021-08-20 DIAGNOSIS — F4310 Post-traumatic stress disorder, unspecified: Secondary | ICD-10-CM | POA: Diagnosis not present

## 2021-08-20 DIAGNOSIS — F429 Obsessive-compulsive disorder, unspecified: Secondary | ICD-10-CM | POA: Diagnosis not present

## 2021-08-22 ENCOUNTER — Ambulatory Visit: Payer: Medicare PPO | Admitting: Podiatry

## 2021-08-23 DIAGNOSIS — H10013 Acute follicular conjunctivitis, bilateral: Secondary | ICD-10-CM | POA: Diagnosis not present

## 2021-08-26 ENCOUNTER — Telehealth: Payer: Self-pay | Admitting: *Deleted

## 2021-08-26 NOTE — Telephone Encounter (Signed)
Spoke with patient she is having mammo and pap in September will have her records faxed here

## 2021-08-27 ENCOUNTER — Ambulatory Visit: Payer: Medicare PPO | Admitting: Family Medicine

## 2021-08-27 DIAGNOSIS — F502 Bulimia nervosa: Secondary | ICD-10-CM | POA: Diagnosis not present

## 2021-08-27 DIAGNOSIS — F429 Obsessive-compulsive disorder, unspecified: Secondary | ICD-10-CM | POA: Diagnosis not present

## 2021-08-27 DIAGNOSIS — F319 Bipolar disorder, unspecified: Secondary | ICD-10-CM | POA: Diagnosis not present

## 2021-08-27 DIAGNOSIS — F4310 Post-traumatic stress disorder, unspecified: Secondary | ICD-10-CM | POA: Diagnosis not present

## 2021-09-03 ENCOUNTER — Encounter: Payer: Self-pay | Admitting: *Deleted

## 2021-09-03 DIAGNOSIS — F4310 Post-traumatic stress disorder, unspecified: Secondary | ICD-10-CM | POA: Diagnosis not present

## 2021-09-03 DIAGNOSIS — F429 Obsessive-compulsive disorder, unspecified: Secondary | ICD-10-CM | POA: Diagnosis not present

## 2021-09-03 DIAGNOSIS — F502 Bulimia nervosa: Secondary | ICD-10-CM | POA: Diagnosis not present

## 2021-09-03 DIAGNOSIS — F319 Bipolar disorder, unspecified: Secondary | ICD-10-CM | POA: Diagnosis not present

## 2021-09-05 ENCOUNTER — Ambulatory Visit: Payer: Medicare PPO | Admitting: Internal Medicine

## 2021-09-09 ENCOUNTER — Encounter: Payer: Self-pay | Admitting: Physician Assistant

## 2021-09-09 ENCOUNTER — Telehealth (INDEPENDENT_AMBULATORY_CARE_PROVIDER_SITE_OTHER): Payer: Medicare PPO | Admitting: Physician Assistant

## 2021-09-09 DIAGNOSIS — G4700 Insomnia, unspecified: Secondary | ICD-10-CM

## 2021-09-09 DIAGNOSIS — F17200 Nicotine dependence, unspecified, uncomplicated: Secondary | ICD-10-CM | POA: Diagnosis not present

## 2021-09-09 DIAGNOSIS — F429 Obsessive-compulsive disorder, unspecified: Secondary | ICD-10-CM | POA: Diagnosis not present

## 2021-09-09 DIAGNOSIS — F502 Bulimia nervosa: Secondary | ICD-10-CM

## 2021-09-09 DIAGNOSIS — F319 Bipolar disorder, unspecified: Secondary | ICD-10-CM

## 2021-09-09 MED ORDER — GABAPENTIN 600 MG PO TABS: ORAL_TABLET | ORAL | 5 refills | Status: DC

## 2021-09-09 MED ORDER — CLONAZEPAM 0.5 MG PO TABS: 0.5000 mg | ORAL_TABLET | Freq: Three times a day (TID) | ORAL | 5 refills | Status: DC | PRN

## 2021-09-09 NOTE — Progress Notes (Signed)
[facility]  Patient ID: Jennifer [name],  MRN: [id number]  PCP: Lindell Spar, MD  Date of Evaluation: 09/09/2021  time spent:20 minutes  Chief Complaint:  Chief Complaint   Anxiety; Depression; Insomnia; Follow-up    Virtual Visit via Telehealth  I connected with patient by a video enabled telemedicine application, with their informed consent, and verified patient privacy and that I am speaking with the correct person using two identifiers.  I am private, in my office and the patient is at home.  I discussed the limitations, risks, security and privacy concerns of performing an evaluation and management service by video and the availability of in person appointments. I also discussed with the patient that there may be a patient responsible charge related to this service. The patient expressed understanding and agreed to proceed.   I discussed the assessment and treatment plan with the patient. The patient was provided an opportunity to ask questions and all were answered. The patient agreed with the plan and demonstrated an understanding of the instructions.   The patient was advised to call back or seek an in-person evaluation if the symptoms worsen or if the condition fails to improve as anticipated.  I provided 20 minutes of non-face-to-face time during this encounter.  HISTORY/CURRENT STATUS: HPI For routine med check.  Feels that meds are working well and mental health is stable. Patient is able to enjoy things.  Energy and motivation are good. No extreme sadness, tearfulness, or feelings of hopelessness.  Sleeps well most of the time. ADLs and personal hygiene are normal.   Denies any changes in concentration, making decisions, or remembering things.  Appetite has not changed.  Weight is stable.  Still binges/purges but not as bad as in the past.  Denies suicidal or homicidal thoughts.  Anxiety is still an issue at times, not having PA but more of a sense of  unease, like something bad is going to happen.  She does take the Klonopin when it gets severe enough.  Patient denies increased energy with decreased need for sleep, increased talkativeness, racing thoughts, impulsivity or risky behaviors, increased spending, increased libido, grandiosity, increased irritability or anger, paranoia, and no hallucinations.  Denies dizziness, syncope, seizures, numbness, tingling, tremor, tics, unsteady gait, slurred speech, confusion.  No akathisia.  The mirtazapine has really helped that.  Has chronic joint pain. Denies unexplained weight loss, frequent infections, or sores that heal slowly.  No polyphagia, polydipsia, or polyuria. Denies visual changes or paresthesias.   Individual Medical History/ Review of Systems: Changes? :Yes     eye problems, might be allergic to Opticrom but not sure. Also having diarrhea, started on Questran     Past medications for mental health diagnoses include: Risperdal, Seroquel, Prozac, Zoloft,  Wellbutrin, Lamictal, Depakote caused hair loss, Xanax, Ambien, trazodone, Trileptal, Luvox, Topamax, Elavil, Pamelor, BuSpar, doxazosin, prazosin, lithium, Vraylar, Abilify, [name], Latuda >60 mg caused abd pain and nausea, propranolol caused diarrhea  Allergies: Codeine, Penicillins, Divalproex sodium, Meloxicam, Sonata [zaleplon], Topamax [topiramate], Aciphex [rabeprazole], Aimovig [erenumab-aooe], and Galcanezumab-gnlm  Current Medications:  Current Outpatient Medications:    Brexpiprazole ([name]) 4 MG TABS, Take 4 mg by mouth in the morning., Disp: 30 tablet, Rfl: 11   cholestyramine (QUESTRAN) 4 g packet, Take 1 packet (4 g total) by mouth 2 (two) times daily., Disp: 60 each, Rfl: 12   diclofenac Sodium (VOLTAREN) 1 % GEL, Apply 4 g topically 4 (four) times daily., Disp: 50 g, Rfl: 0   dicyclomine (BENTYL)  20 MG tablet, Take 1 tablet (20 mg total) by mouth 3 (three) times daily as needed for spasms (Diarrhea)., Disp: 30 tablet,  Rfl: 1   Famotidine-Ca Carb-Mag Hydrox (PEPCID COMPLETE PO), Take 1 tablet by mouth daily., Disp: , Rfl:    fluvoxaMINE (LUVOX) 100 MG tablet, Take 3 tablets (300 mg total) by mouth at bedtime., Disp: 270 tablet, Rfl: 3   hydrOXYzine (ATARAX) 25 MG tablet, Take 1-2 tablets (25-50 mg total) by mouth every 8 (eight) hours as needed., Disp: 90 tablet, Rfl: 11   Lactobacillus-Inulin (CULTURELLE DIGESTIVE DAILY PO), Take by mouth., Disp: , Rfl:    loperamide (IMODIUM) 2 MG capsule, Take 2 mg by mouth as needed for diarrhea or loose stools., Disp: , Rfl:    loratadine (CLARITIN) 10 MG tablet, Take 5 mg by mouth daily as needed for allergies. , Disp: , Rfl:    mirtazapine (REMERON) 15 MG tablet, Take 1 tablet (15 mg total) by mouth at bedtime., Disp: 90 tablet, Rfl: 3   naproxen (NAPROSYN) 500 MG tablet, Take 500 mg by mouth 2 (two) times daily as needed for migraine., Disp: , Rfl:    ondansetron (ZOFRAN-ODT) 4 MG disintegrating tablet, Take 1 tablet (4 mg total) by mouth every 8 (eight) hours as needed for nausea or vomiting., Disp: 30 tablet, Rfl: 1   pramipexole (MIRAPEX) 0.5 MG tablet, Take 1 tablet (0.5 mg total) by mouth at bedtime., Disp: 90 tablet, Rfl: 3   prednisoLONE acetate (PRED FORTE) 1 % ophthalmic suspension, Place 1 drop into both eyes in the morning and at bedtime., Disp: , Rfl:    Prenatal Vit-Fe Fumarate-FA (PRENATAL MULTIVITAMIN) TABS tablet, Take 1 tablet by mouth daily at 12 noon., Disp: , Rfl:    RESTASIS 0.05 % ophthalmic emulsion, 1 drop 2 (two) times daily., Disp: , Rfl:    Rimegepant Sulfate (NURTEC) 75 MG TBDP, Take 75 mg by mouth daily as needed (take for abortive therapy of migraine, no more than 1 tablet in 24 hours or 10 per month)., Disp: 8 tablet, Rfl: 11   triamcinolone cream (KENALOG) 0.1 %, Apply 1 application. topically 2 (two) times daily., Disp: 30 g, Rfl: 0   valACYclovir (VALTREX) 500 MG tablet, Take 500 mg by mouth daily as needed (Flair up take Twice a day  for 3  days and stop). , Disp: , Rfl:    clonazePAM (KLONOPIN) 0.5 MG tablet, Take 1 tablet (0.5 mg total) by mouth 3 (three) times daily as needed for anxiety., Disp: 30 tablet, Rfl: 5   cromolyn (OPTICROM) 4 % ophthalmic solution, Place 1 drop into both eyes 2 (two) times daily., Disp: , Rfl:    diltiazem (CARDIZEM) 30 MG tablet, Take 1 tablet (30 mg total) by mouth 2 (two) times daily., Disp: 60 tablet, Rfl: 1   gabapentin (NEURONTIN) 600 MG tablet, 2 p.o. every morning, 1 p.o. q. afternoon, 2 p.o. nightly., Disp: 150 tablet, Rfl: 5   Wheat Dextrin (BENEFIBER DRINK MIX PO), Take by mouth., Disp: , Rfl:  Medication Side Effects: none  Family Medical/ Social History: Changes?  No  MENTAL HEALTH EXAM:  Last menstrual period 10/07/2011.There is no height or weight on file to calculate BMI.  General Appearance: Casual and Well Groomed  Eye Contact:  Good  Speech:  Clear and Coherent and Normal Rate  Volume:  Normal  Mood:  Euthymic  Affect:  Congruent  Thought Process:  Goal Directed and Descriptions of Associations: Circumstantial  Orientation:  Full (Time, Place,  and Person)  Thought Content: Logical   Suicidal Thoughts:  No  Homicidal Thoughts:  No  Memory:  WNL  Judgement:  Good  Insight:  Good  Psychomotor Activity:  Normal  Concentration:  Concentration: Good and Attention Span: Good  Recall:  Good  Fund of Knowledge: Good  Language: Good  Assets:  Desire for Improvement  ADL's:  Intact  Cognition: WNL  Prognosis:  Good   DIAGNOSES:    ICD-10-CM   1. Bipolar I disorder ([place])  F31.9     2. Obsessive-compulsive disorder, unspecified type  F42.9     3. Insomnia, unspecified type  G47.00     4. Bulimia  F50.2     5. Smoker  F17.200       Receiving Psychotherapy: Yes  Rica Records, another therapist for brain-spotting  RECOMMENDATIONS:  PDMP was reviewed.  Last Klonopin filled 06/23/2021. I provided 20 minutes of non-face to face time during this encounter, including  time spent before and after the visit in records review, medical decision making, counseling pertinent to today's visit, and charting.   Smoking cessation briefly discussed. Also discussed the binging and purging.  She continues to discuss this behavior with her therapist and it does help. As far as her mental health medications go she is doing well so no changes will be made.  Continue [name] 4 mg, 1 p.o. every morning. Continue Klonopin 0.5 mg, 1 p.o. twice daily prn. Continue Luvox 100 mg, 3 p.o. nightly. Continue gabapentin 600 mg, 2 qam.,  3 p.o. nightly.  Continue hydroxyzine 25-50 mg, 1 p.o. 3 times daily as needed Continue mirtazapine 15 mg, 1 p.o. nightly  Continue pramipexole 0.5 mg nightly.  Per another provider. Continue therapy. Return in 3 months.  [name], PA-C

## 2021-09-10 ENCOUNTER — Emergency Department (HOSPITAL_COMMUNITY): Payer: Medicare PPO

## 2021-09-10 ENCOUNTER — Ambulatory Visit
Admission: EM | Admit: 2021-09-10 | Discharge: 2021-09-10 | Disposition: A | Discharge status: Nursing Home (Includes ICF) | Payer: Medicare PPO | Attending: Nurse Practitioner | Admitting: Nurse Practitioner

## 2021-09-10 ENCOUNTER — Other Ambulatory Visit: Payer: Self-pay

## 2021-09-10 ENCOUNTER — Encounter (HOSPITAL_COMMUNITY): Payer: Self-pay

## 2021-09-10 ENCOUNTER — Observation Stay (HOSPITAL_COMMUNITY)
Admission: EM | Admit: 2021-09-10 | Discharge: 2021-09-11 | Disposition: A | Discharge status: Home / Self Care | Payer: Medicare PPO | Attending: Internal Medicine | Admitting: Internal Medicine

## 2021-09-10 DIAGNOSIS — F4310 Post-traumatic stress disorder, unspecified: Secondary | ICD-10-CM | POA: Diagnosis not present

## 2021-09-10 DIAGNOSIS — R002 Palpitations: Secondary | ICD-10-CM | POA: Diagnosis not present

## 2021-09-10 DIAGNOSIS — R778 Other specified abnormalities of plasma proteins: Secondary | ICD-10-CM | POA: Diagnosis not present

## 2021-09-10 DIAGNOSIS — F502 Bulimia nervosa, unspecified: Secondary | ICD-10-CM | POA: Diagnosis present

## 2021-09-10 DIAGNOSIS — I10 Essential (primary) hypertension: Secondary | ICD-10-CM | POA: Diagnosis not present

## 2021-09-10 DIAGNOSIS — R42 Dizziness and giddiness: Secondary | ICD-10-CM

## 2021-09-10 DIAGNOSIS — F319 Bipolar disorder, unspecified: Secondary | ICD-10-CM | POA: Diagnosis not present

## 2021-09-10 DIAGNOSIS — F3160 Bipolar disorder, current episode mixed, unspecified: Secondary | ICD-10-CM | POA: Diagnosis present

## 2021-09-10 DIAGNOSIS — I471 Supraventricular tachycardia, unspecified: Secondary | ICD-10-CM

## 2021-09-10 DIAGNOSIS — R000 Tachycardia, unspecified: Secondary | ICD-10-CM | POA: Diagnosis not present

## 2021-09-10 DIAGNOSIS — Z79899 Other long term (current) drug therapy: Secondary | ICD-10-CM | POA: Diagnosis not present

## 2021-09-10 DIAGNOSIS — F429 Obsessive-compulsive disorder, unspecified: Secondary | ICD-10-CM | POA: Diagnosis present

## 2021-09-10 DIAGNOSIS — F17210 Nicotine dependence, cigarettes, uncomplicated: Secondary | ICD-10-CM | POA: Insufficient documentation

## 2021-09-10 DIAGNOSIS — R197 Diarrhea, unspecified: Secondary | ICD-10-CM | POA: Diagnosis not present

## 2021-09-10 DIAGNOSIS — F411 Generalized anxiety disorder: Secondary | ICD-10-CM

## 2021-09-10 DIAGNOSIS — R519 Headache, unspecified: Secondary | ICD-10-CM | POA: Diagnosis not present

## 2021-09-10 DIAGNOSIS — R7989 Other specified abnormal findings of blood chemistry: Secondary | ICD-10-CM | POA: Diagnosis present

## 2021-09-10 LAB — BASIC METABOLIC PANEL
Anion gap: 4 — ABNORMAL LOW (ref 5–15)
BUN: 19 mg/dL (ref 6–20)
CO2: 26 mmol/L (ref 22–32)
Calcium: 9.2 mg/dL (ref 8.9–10.3)
Chloride: 112 mmol/L — ABNORMAL HIGH (ref 98–111)
Creatinine, Ser: 0.93 mg/dL (ref 0.44–1.00)
GFR, Estimated: >60 mL/min (ref >60)
Glucose, Bld: 107 mg/dL — ABNORMAL HIGH (ref 70–99)
Potassium: 3.7 mmol/L (ref 3.5–5.1)
Sodium: 142 mmol/L (ref 135–145)

## 2021-09-10 LAB — CBC WITH DIFFERENTIAL/PLATELET
Abs Immature Granulocytes: 0.02 10*3/uL (ref 0.00–0.07)
Basophils Absolute: 0.1 10*3/uL (ref 0.0–0.1)
Basophils Relative: 1 %
Eosinophils Absolute: 0.2 10*3/uL (ref 0.0–0.5)
Eosinophils Relative: 3 %
HCT: 41.5 % (ref 36.0–46.0)
Hemoglobin: 14.2 g/dL (ref 12.0–15.0)
Immature Granulocytes: 0 %
Lymphocytes Relative: 30 %
Lymphs Abs: 2.1 10*3/uL (ref 0.7–4.0)
MCH: 31.4 pg (ref 26.0–34.0)
MCHC: 34.2 g/dL (ref 30.0–36.0)
MCV: 91.8 fL (ref 80.0–100.0)
Monocytes Absolute: 0.9 10*3/uL (ref 0.1–1.0)
Monocytes Relative: 13 %
Neutro Abs: 3.7 10*3/uL (ref 1.7–7.7)
Neutrophils Relative %: 53 %
Platelets: 169 10*3/uL (ref 150–400)
RBC: 4.52 MIL/uL (ref 3.87–5.11)
RDW: 13.5 % (ref 11.5–15.5)
WBC: 6.9 10*3/uL (ref 4.0–10.5)
nRBC: 0 % (ref 0.0–0.2)

## 2021-09-10 LAB — TSH: TSH: 1.335 u[IU]/mL (ref 0.350–4.500)

## 2021-09-10 LAB — TROPONIN I (HIGH SENSITIVITY)
Troponin I (High Sensitivity): 180 ng/L (ref <18)
Troponin I (High Sensitivity): 309 ng/L (ref <18)
Troponin I (High Sensitivity): 251 ng/L (ref <18)

## 2021-09-10 LAB — RAPID URINE DRUG SCREEN, HOSP PERFORMED
Amphetamines: NOT DETECTED
Barbiturates: NOT DETECTED
Benzodiazepines: NOT DETECTED
Cocaine: NOT DETECTED
Opiates: NOT DETECTED
Tetrahydrocannabinol: NOT DETECTED

## 2021-09-10 LAB — MAGNESIUM: Magnesium: 2.2 mg/dL (ref 1.7–2.4)

## 2021-09-10 MED ORDER — VALACYCLOVIR HCL 500 MG PO TABS: 500.0000 mg | ORAL_TABLET | Freq: Every day | ORAL | Status: DC | PRN

## 2021-09-10 MED ORDER — DICYCLOMINE HCL 20 MG PO TABS
20.0000 mg | ORAL_TABLET | Freq: Three times a day (TID) | ORAL | Status: DC | PRN
Start: 2021-09-10 — End: 2021-09-11

## 2021-09-10 MED ORDER — POTASSIUM CHLORIDE IN NACL 20-0.45 MEQ/L-% IV SOLN
INTRAVENOUS | Status: DC
  Filled 2021-09-10 (×2): qty 1000

## 2021-09-10 MED ORDER — ONDANSETRON HCL 4 MG/2ML IJ SOLN: 4.0000 mg | Freq: Four times a day (QID) | INTRAMUSCULAR | Status: DC | PRN

## 2021-09-10 MED ORDER — BREXPIPRAZOLE 4 MG PO TABS: 4.0000 mg | ORAL_TABLET | Freq: Every morning | ORAL | Status: DC

## 2021-09-10 MED ORDER — ONDANSETRON HCL 4 MG PO TABS: 4.0000 mg | ORAL_TABLET | Freq: Four times a day (QID) | ORAL | Status: DC | PRN

## 2021-09-10 MED ORDER — FLUVOXAMINE MALEATE 100 MG PO TABS
300.0000 mg | ORAL_TABLET | Freq: Every day | ORAL | Status: DC
  Administered 2021-09-10: 300 mg via ORAL
  Filled 2021-09-10 (×2): qty 3

## 2021-09-10 MED ORDER — ACETAMINOPHEN 325 MG PO TABS: 650.0000 mg | ORAL_TABLET | Freq: Four times a day (QID) | ORAL | Status: DC | PRN

## 2021-09-10 MED ORDER — PRAMIPEXOLE DIHYDROCHLORIDE 1 MG PO TABS
0.5000 mg | ORAL_TABLET | Freq: Every day | ORAL | Status: DC
  Administered 2021-09-10: 0.5 mg via ORAL
  Filled 2021-09-10: qty 2

## 2021-09-10 MED ORDER — CYCLOSPORINE 0.05 % OP EMUL
1.0000 [drp] | Freq: Two times a day (BID) | OPHTHALMIC | Status: DC
Start: 2021-09-10 — End: 2021-09-11
  Administered 2021-09-10 – 2021-09-11 (×2): 1 [drp] via OPHTHALMIC
  Filled 2021-09-10 (×2): qty 30

## 2021-09-10 MED ORDER — CHOLESTYRAMINE 4 G PO PACK
4.0000 g | PACK | Freq: Two times a day (BID) | ORAL | Status: DC
  Administered 2021-09-11: 4 g via ORAL
  Filled 2021-09-10 (×3): qty 1

## 2021-09-10 MED ORDER — MIRTAZAPINE 15 MG PO TABS
15.0000 mg | ORAL_TABLET | Freq: Every day | ORAL | Status: DC
  Administered 2021-09-10: 15 mg via ORAL
  Filled 2021-09-10: qty 1

## 2021-09-10 MED ORDER — ACETAMINOPHEN 650 MG RE SUPP: 650.0000 mg | Freq: Four times a day (QID) | RECTAL | Status: DC | PRN

## 2021-09-10 MED ORDER — GABAPENTIN 300 MG PO CAPS: 300.0000 mg | ORAL_CAPSULE | Freq: Every day | ORAL | Status: DC

## 2021-09-10 MED ORDER — ENOXAPARIN SODIUM 40 MG/0.4ML IJ SOSY
40.0000 mg | PREFILLED_SYRINGE | INTRAMUSCULAR | Status: DC
  Administered 2021-09-10: 40 mg via SUBCUTANEOUS
  Filled 2021-09-10: qty 0.4

## 2021-09-10 MED ORDER — GABAPENTIN 300 MG PO CAPS
600.0000 mg | ORAL_CAPSULE | Freq: Two times a day (BID) | ORAL | Status: DC
  Administered 2021-09-10 – 2021-09-11 (×2): 600 mg via ORAL
  Filled 2021-09-10 (×2): qty 2

## 2021-09-10 MED ORDER — BREXPIPRAZOLE 1 MG PO TABS
4.0000 mg | ORAL_TABLET | Freq: Every day | ORAL | Status: DC
  Administered 2021-09-11: 4 mg via ORAL
  Filled 2021-09-10 (×2): qty 2

## 2021-09-10 MED ORDER — FAMOTIDINE-CA CARB-MAG HYDROX 10-800-165 MG PO CHEW: 1.0000 | CHEWABLE_TABLET | Freq: Every day | ORAL | Status: DC | PRN

## 2021-09-10 MED ORDER — BREXPIPRAZOLE 2 MG PO TABS
4.0000 mg | ORAL_TABLET | Freq: Every day | ORAL | Status: DC
  Filled 2021-09-10 (×3): qty 2

## 2021-09-10 MED ORDER — POLYETHYLENE GLYCOL 3350 17 G PO PACK: 17.0000 g | PACK | Freq: Every day | ORAL | Status: DC | PRN

## 2021-09-10 NOTE — ED Provider Notes (Signed)
[facility] EMERGENCY DEPARTMENT Provider Note   CSN: [id number] Arrival date & time: 09/10/21  1555     History  Chief Complaint  Patient presents with   Tachycardia    Jennifer [name] is a 56 y.o. female.  Patient is a 56 year old female with past medical history of palpitations and bulimia presenting for episode of palpitations, dizziness, and lightheadedness that started this morning.  Patient denies any chest pain or shortness of breath at this time.  Patient admits to a history of diarrhea that is currently being controlled with medications.  Also admits to history of bulimia and vomits intermittently.  No vomiting today.  Patient was evaluated at urgent care prior to arrival.  At that time they had concerns for possible SVT.  I reviewed the EKG that was completed at urgent care.  Patient in sinus tachycardia with a rate of 134 bpm.  Patient currently normal sinus rhythm with a rate of 94 bpm.  She admits to dehydration today.  Also drinks several diet Mountain Dew's throughout the day.  Patient is also an active smoker.  She is otherwise feeling fine at this time.  Denies any palpitations.  The history is provided by the patient. No language interpreter was used.       Home Medications Prior to Admission medications   Medication Sig Start Date End Date Taking? Authorizing Provider  Brexpiprazole ([name]) 4 MG TABS Take 4 mg by mouth in the morning. 12/27/20  Yes Hurst, Teresa T, PA-C  cholestyramine (QUESTRAN) 4 g packet Take 1 packet (4 g total) by mouth 2 (two) times daily. 08/08/21  Yes Lindell Spar, MD  clonazePAM (KLONOPIN) 0.5 MG tablet Take 1 tablet (0.5 mg total) by mouth 3 (three) times daily as needed for anxiety. 09/09/21  Yes [name] T, PA-C  cromolyn (OPTICROM) 4 % ophthalmic solution Place 1 drop into both eyes 2 (two) times daily. 11/16/20  Yes [provider]  diclofenac Sodium (VOLTAREN) 1 % GEL Apply 4 g topically 4 (four) times daily. 11/19/20   Yes Lindell Spar, MD  dicyclomine (BENTYL) 20 MG tablet Take 1 tablet (20 mg total) by mouth 3 (three) times daily as needed for spasms (Diarrhea). 07/31/21  Yes Lindell Spar, MD  Famotidine-Ca Carb-Mag Hydrox [facility] COMPLETE PO) Take 1 tablet by mouth daily.   Yes [provider]  fluvoxaMINE (LUVOX) 100 MG tablet Take 3 tablets (300 mg total) by mouth at bedtime. 07/08/21  Yes Hurst, Helene Kelp T, PA-C  gabapentin (NEURONTIN) 600 MG tablet 2 p.o. every morning, 1 p.o. q. afternoon, 2 p.o. nightly. 09/09/21  Yes [name] T, PA-C  hydrOXYzine (ATARAX) 25 MG tablet Take 1-2 tablets (25-50 mg total) by mouth every 8 (eight) hours as needed. 12/27/20  Yes Hurst, Teresa T, PA-C  Lactobacillus-Inulin (CULTURELLE DIGESTIVE DAILY PO) Take by mouth.   Yes [provider]  loperamide (IMODIUM) 2 MG capsule Take 2 mg by mouth as needed for diarrhea or loose stools.   Yes [provider]  loratadine (CLARITIN) 10 MG tablet Take 5 mg by mouth daily as needed for allergies.    Yes [provider]  mirtazapine (REMERON) 15 MG tablet Take 1 tablet (15 mg total) by mouth at bedtime. 07/08/21  Yes Hurst, Helene Kelp T, PA-C  naproxen (NAPROSYN) 500 MG tablet Take 500 mg by mouth 2 (two) times daily as needed for migraine.   Yes [provider]  ondansetron (ZOFRAN-ODT) 4 MG disintegrating tablet Take 1  tablet (4 mg total) by mouth every 8 (eight) hours as needed for nausea or vomiting. 12/31/20  Yes Dohmeier, Asencion Partridge, MD  pramipexole (MIRAPEX) 0.5 MG tablet Take 1 tablet (0.5 mg total) by mouth at bedtime. 12/30/20  Yes Dohmeier, Asencion Partridge, MD  prednisoLONE acetate (PRED FORTE) 1 % ophthalmic suspension Place 1 drop into both eyes in the morning and at bedtime.   Yes [provider]  Prenatal Vit-Fe Fumarate-FA (PRENATAL MULTIVITAMIN) TABS tablet Take 1 tablet by mouth daily at 12 noon.   Yes [provider]  RESTASIS 0.05 % ophthalmic emulsion 1 drop 2 (two)  times daily. 11/11/20  Yes [provider]  Rimegepant Sulfate (NURTEC) 75 MG TBDP Take 75 mg by mouth daily as needed (take for abortive therapy of migraine, no more than 1 tablet in 24 hours or 10 per month). 12/30/20  Yes Dohmeier, Asencion Partridge, MD  triamcinolone cream (KENALOG) 0.1 % Apply 1 application. topically 2 (two) times daily. 06/25/21  Yes Lindell Spar, MD  valACYclovir (VALTREX) 500 MG tablet Take 500 mg by mouth daily as needed (Flair up take Twice a day  for 3 days and stop).  01/03/18  Yes [provider]  Wheat Dextrin (BENEFIBER DRINK MIX PO) Take by mouth.   Yes [provider]  fluticasone (FLONASE) 50 MCG/ACT nasal spray Place 2 sprays into both nostrils daily. Patient not taking: Reported on 09/10/2021 02/28/21   Renee Rival, FNP      Allergies    Codeine, Penicillins, Divalproex sodium, Meloxicam, Sonata [zaleplon], Topamax [topiramate], Aciphex [rabeprazole], Aimovig [erenumab-aooe], and Galcanezumab-gnlm    Review of Systems   Review of Systems  Constitutional:  Negative for chills and fever.  HENT:  Negative for ear pain and sore throat.   Eyes:  Negative for pain and visual disturbance.  Respiratory:  Negative for cough and shortness of breath.   Cardiovascular:  Positive for palpitations. Negative for chest pain.  Gastrointestinal:  Negative for abdominal pain and vomiting.  Genitourinary:  Negative for dysuria and hematuria.  Musculoskeletal:  Negative for arthralgias and back pain.  Skin:  Negative for color change and rash.  Neurological:  Positive for dizziness and light-headedness. Negative for seizures and syncope.  All other systems reviewed and are negative.   Physical Exam Updated Vital Signs BP 125/82   Pulse 78   Temp 98.6 F (37 C) (Oral)   Resp 17   Ht 5' 4.25" (1.632 m)   Wt 81.6 kg   LMP 10/07/2011   SpO2 94%   BMI 30.66 kg/m  Physical Exam Vitals and nursing note reviewed.  Constitutional:       General: She is not in acute distress.    Appearance: She is well-developed.  HENT:     Head: Normocephalic and atraumatic.  Eyes:     Conjunctiva/sclera: Conjunctivae normal.  Cardiovascular:     Rate and Rhythm: Normal rate and regular rhythm.     Heart sounds: No murmur heard. Pulmonary:     Effort: Pulmonary effort is normal. No respiratory distress.     Breath sounds: Normal breath sounds.  Abdominal:     Palpations: Abdomen is soft.     Tenderness: There is no abdominal tenderness.  Musculoskeletal:        General: No swelling.     Cervical back: Neck supple.  Skin:    General: Skin is warm and dry.     Capillary Refill: Capillary refill takes less than 2 seconds.  Neurological:  Mental Status: She is alert.  Psychiatric:        Mood and Affect: Mood normal.     ED Results / Procedures / Treatments   Labs (all labs ordered are listed, but only abnormal results are displayed) Labs Reviewed  BASIC METABOLIC PANEL - Abnormal; Notable for the following components:      Result Value   Chloride 112 (*)    Glucose, Bld 107 (*)    Anion gap 4 (*)    All other components within normal limits  TROPONIN I (HIGH SENSITIVITY) - Abnormal; Notable for the following components:   Troponin I (High Sensitivity) 180 (*)    All other components within normal limits  TROPONIN I (HIGH SENSITIVITY) - Abnormal; Notable for the following components:   Troponin I (High Sensitivity) 309 (*)    All other components within normal limits  TROPONIN I (HIGH SENSITIVITY) - Abnormal; Notable for the following components:   Troponin I (High Sensitivity) 251 (*)    All other components within normal limits  CBC WITH DIFFERENTIAL/PLATELET  MAGNESIUM  TSH  RAPID URINE DRUG SCREEN, HOSP PERFORMED  BASIC METABOLIC PANEL  CBC    EKG None  Radiology DG Chest Portable 1 View  Result Date: 09/10/2021 CLINICAL DATA:  Palpitations EXAM: PORTABLE CHEST 1 VIEW COMPARISON:  Chest radiograph  10/08/2020 FINDINGS: The cardiomediastinal silhouette is normal. There is no focal consolidation or pulmonary edema. There is no pleural effusion or pneumothorax There is no acute osseous abnormality. IMPRESSION: No radiographic evidence of acute cardiopulmonary process. Electronically Signed   By: Valetta Mole M.D.   On: 09/10/2021 16:41    Procedures Procedures    Medications Ordered in ED Medications  cholestyramine (QUESTRAN) packet 4 g (has no administration in time range)  dicyclomine (BENTYL) tablet 20 mg (has no administration in time range)  fluvoxaMINE (LUVOX) tablet 300 mg (300 mg Oral Given 09/10/21 2155)  gabapentin (NEURONTIN) capsule 600 mg (600 mg Oral Given 09/10/21 2154)  mirtazapine (REMERON) tablet 15 mg (15 mg Oral Given 09/10/21 2154)  pramipexole (MIRAPEX) tablet 0.5 mg (0.5 mg Oral Given 09/10/21 2154)  cycloSPORINE (RESTASIS) 0.05 % ophthalmic emulsion 1 drop (1 drop Both Eyes Given 09/10/21 2155)  valACYclovir (VALTREX) tablet 500 mg (has no administration in time range)  gabapentin (NEURONTIN) capsule 300 mg (has no administration in time range)  brexpiprazole ([name]) tablet 4 mg (has no administration in time range)  enoxaparin (LOVENOX) injection 40 mg (40 mg Subcutaneous Given 09/10/21 2157)  acetaminophen (TYLENOL) tablet 650 mg (has no administration in time range)    Or  acetaminophen (TYLENOL) suppository 650 mg (has no administration in time range)  ondansetron (ZOFRAN) tablet 4 mg (has no administration in time range)    Or  ondansetron (ZOFRAN) injection 4 mg (has no administration in time range)  polyethylene glycol (MIRALAX / GLYCOLAX) packet 17 g (has no administration in time range)  0.45 % NaCl with KCl 20 mEq / L infusion ( Intravenous New Bag/Given 09/10/21 2152)    ED Course/ Medical Decision Making/ A&P                           Medical Decision Making Amount and/or Complexity of Data Reviewed Labs: ordered. Radiology:  ordered.  Risk Decision regarding hospitalization.   32:51 PM  56 year old female with past medical history of palpitations and bulimia presenting for episode of palpitations, dizziness, and lightheadedness that started this morning.  Is alert  and oriented x3, no acute distress, afebrile, stable to signs.  Physical exam demonstrates normal breath sounds bilaterally with no adventitious lung sounds.    Patient has normal sinus rhythm on twelve-lead EKG with a rate of 94 bpm.  I reviewed the EKG from urgent care which demonstrates sinus tachycardia at a rate of 134 bpm. no ST segment elevation or depression.  No T wave inversions.  Chest x-ray demonstrates no acute process.  Stable electrolytes.  Stable hemoglobin.  Troponin elevated at 180.  Repeat troponin 309.  Patient continues to deny any chest pain at this time.  Patient remains in normal sinus rhythm with no repeat episodes of tachycardia.  Spoke with cardiology who recommend echocardiogram in the morning.  Aspirin given.  No heparin at this time.  Trend troponins.  Spoke with admitting team who agrees to accept patient.  Patient up-to-date and agreeable to plan.        Final Clinical Impression(s) / ED Diagnoses Final diagnoses:  Elevated troponin  Palpitations  Tachycardia    Rx / DC Orders ED Discharge Orders     None         Lianne Cure, DO 54/65/68 2349

## 2021-09-10 NOTE — ED Notes (Signed)
Patient given bag lunch and ginger ale at this time. Patient states that she has not taken any of her meds today.

## 2021-09-10 NOTE — Assessment & Plan Note (Addendum)
Resume brexpiprazole, fluvoxamine, mirtazapine,

## 2021-09-10 NOTE — Assessment & Plan Note (Addendum)
Troponin 180 > 309.  Presents with symptomatic tachycardia, she reports some chest tightness.  Likely demand ischemia from tachycardia.  Smokes a pack of cigarettes daily.  No personal or premature family history of cardiac disease.  -Echocardiogram -Trend troponin -N.p.o. midnight - -Not started on aspirin - allergy to meloxicam "Possible chest tightness',

## 2021-09-10 NOTE — ED Notes (Addendum)
Report given to Jinny Blossom, RN [facility] ED.  EMS called by RT. Pt verbalized consent for transport to ED per EMS. Pt spouse aware pt being transferred to ED.

## 2021-09-10 NOTE — Assessment & Plan Note (Addendum)
Symptomatic with dizziness, palpitations.  Initial EKG from urgent care showed sinus tachycardia with rate of 134.  Heart rate has ranged from 73-94 here.  Blood pressure systolic ranging from 734-037.  Recent lipid panel 08/01/2021, LDL of 86, total cholesterol 160. -Echocardiogram -Cardiology to see in the morning - TSH

## 2021-09-10 NOTE — ED Triage Notes (Signed)
Patient states feel likes her heart was beating fast and she has some dizziness. Patient states hx of vomiting and diarrhea. Per UC patient had Svt at 134. EKG shows tachycardia. 94  HR in triage. Patient denies CP or fast heart rate currently.

## 2021-09-10 NOTE — ED Triage Notes (Signed)
Pt reports she feels the heartbeats in her ears, dizziness "feels like I will pass out", nausea, chest tightness . Pt reports she feels her blood pressure is high.

## 2021-09-10 NOTE — ED Provider Notes (Signed)
[facility]    CSN: [id number] Arrival date & time: 09/10/21  1513      History   Chief Complaint Chief Complaint  Patient presents with   Dizziness   Tachycardia    HPI Jennifer [name] is a 56 y.o. female.   Patient presents to urgent care for feel like her heart is beating out of her chest, dizziness, headache, lightheadedness.  Reports history of bulimia that is "severe".  Thinks her electrolytes may be off.  Denies any passing out.  Reports that she did eat lunch a little bit ago.    Past Medical History:  Diagnosis Date   Abdominal pain, epigastric 10/07/2018   Acute non-recurrent maxillary sinusitis 11/07/2019   Anxiety    Arthritis    Binge-eating and purging type anorexia nervosa    Bipolar affective disorder ([place]) 12/04/2015   Bipolar affective disorder, current episode manic without psychotic symptoms ([place]) 12/04/2015   Bipolar disorder ([place])    Cellulitis and abscess of buttock 02/15/2013   Cystitis, interstitial    Depression    Depression    Phreesia 01/28/2020   Esophageal polyp    about 20 years ago   GERD (gastroesophageal reflux disease)    Left wrist pain 03/22/2019   OCD (obsessive compulsive disorder)    Palpitations 05/25/2014   Rectal pain 12/06/2018   Rib pain on right side 01/31/2019   Rosacea 03/27/2019   Sprain of temporomandibular joint or ligament 01/10/2019    Patient Active Problem List   Diagnosis Date Noted   Encounter for general adult medical examination with abnormal findings 08/08/2021   Chronic diarrhea 08/08/2021   Fissure in skin of foot 08/08/2021   Allergic rhinitis 02/28/2021   Hyperkeratotic fissured eczema of hand 01/21/2021   Bronchitis 01/06/2021   Tobacco abuse counseling 12/30/2020   [place] 12/30/2020   PVD (peripheral vascular disease) ([place]) 12/30/2020   Sleep related bruxism 12/30/2020   Thrombocytopenia ([place]) 11/29/2020   Leg swelling 11/29/2020   Left hip pain 11/19/2020   Easy bruising  08/19/2020   Peripheral neuropathy 08/19/2020   Restless leg syndrome 06/13/2020   Tick bite of back 06/13/2020   Neck pain on right side 03/14/2020   Bulimia 09/12/2018   Gastroesophageal reflux disease 09/12/2018   Migraine without status migrainosus, not intractable 09/12/2018   Generalized anxiety disorder 09/27/2017   Mixed bipolar I disorder ([place]) 12/05/2015   OCD (obsessive compulsive disorder) 12/04/2015    Past Surgical History:  Procedure Laterality Date   BLADDER SURGERY     CHOLECYSTECTOMY     COLONOSCOPY WITH PROPOFOL N/A 10/05/2019   Procedure: COLONOSCOPY WITH PROPOFOL;  Surgeon: Daneil Dolin, MD;  [facility];  Service: Endoscopy;  Laterality: N/A;  12:45pm   COSMETIC SURGERY N/A    Phreesia 01/28/2020   ESOPHAGOGASTRODUODENOSCOPY  09/28/2016   [facility]; Dr. Therisa Doyne; erosions in the esophagus, 5 cm hiatal hernia, nonbleeding erosive gastropathy s/p biopsied, normal duodenum.  Path with chronic inactive gastritis, no H. pylori or intestinal metaplasia.    ESOPHAGOGASTRODUODENOSCOPY (EGD) WITH PROPOFOL N/A 10/17/2018   Dr. Gala Romney: mild reflux esophagitis, small hiatal hernia   POLYPECTOMY  10/05/2019   Procedure: POLYPECTOMY;  Surgeon: Daneil Dolin, MD;  [facility]: [facility];  Service: Endoscopy;;   VOCAL CORD LATERALIZATION, ENDOSCOPIC APPROACH W/ MLB      OB History     Gravida  3   Para      Term      Preterm  AB  3   Living  0      SAB  1   IAB  2   Ectopic      Multiple      Live Births               Home Medications    Prior to Admission medications   Medication Sig Start Date End Date Taking? Authorizing Provider  Brexpiprazole ([name]) 4 MG TABS Take 4 mg by mouth in the morning. 12/27/20   [name] T, PA-C  cholestyramine (QUESTRAN) 4 g packet Take 1 packet (4 g total) by mouth 2 (two) times daily. 08/08/21   Lindell Spar, MD  clonazePAM (KLONOPIN) 0.5 MG tablet Take 1 tablet (0.5 mg total) by mouth 3  (three) times daily as needed for anxiety. 09/09/21   [name] T, PA-C  cromolyn (OPTICROM) 4 % ophthalmic solution Place 1 drop into both eyes 2 (two) times daily. Patient not taking: Reported on 09/09/2021 11/16/20   [provider]  diclofenac Sodium (VOLTAREN) 1 % GEL Apply 4 g topically 4 (four) times daily. 11/19/20   Lindell Spar, MD  dicyclomine (BENTYL) 20 MG tablet Take 1 tablet (20 mg total) by mouth 3 (three) times daily as needed for spasms (Diarrhea). 07/31/21   Lindell Spar, MD  Famotidine-Ca Carb-Mag Hydrox (PEPCID COMPLETE PO) Take 1 tablet by mouth daily.    [provider]  fluticasone (FLONASE) 50 MCG/ACT nasal spray Place 2 sprays into both nostrils daily. 02/28/21   Paseda, Dewaine Conger, FNP  fluvoxaMINE (LUVOX) 100 MG tablet Take 3 tablets (300 mg total) by mouth at bedtime. 07/08/21   [name] T, PA-C  gabapentin (NEURONTIN) 600 MG tablet 2 p.o. every morning, 1 p.o. q. afternoon, 2 p.o. nightly. 09/09/21   Addison Lank, PA-C  hydrOXYzine (ATARAX) 25 MG tablet Take 1-2 tablets (25-50 mg total) by mouth every 8 (eight) hours as needed. 12/27/20   [name] T, PA-C  L-Lysine 500 MG TABS Take by mouth.    [provider]  Lactobacillus-Inulin (CULTURELLE DIGESTIVE DAILY PO) Take by mouth.    [provider]  loperamide (IMODIUM) 2 MG capsule Take 2 mg by mouth as needed for diarrhea or loose stools.    [provider]  loratadine (CLARITIN) 10 MG tablet Take 5 mg by mouth daily as needed for allergies.     [provider]  mirtazapine (REMERON) 15 MG tablet Take 1 tablet (15 mg total) by mouth at bedtime. 07/08/21   [name] T, PA-C  naproxen (NAPROSYN) 500 MG tablet Take 500 mg by mouth 2 (two) times daily as needed for migraine.    [provider]  ondansetron (ZOFRAN-ODT) 4 MG disintegrating tablet Take 1 tablet (4 mg total) by mouth every 8 (eight) hours as needed for nausea or vomiting. 12/31/20    Dohmeier, Asencion Partridge, MD  pramipexole (MIRAPEX) 0.5 MG tablet Take 1 tablet (0.5 mg total) by mouth at bedtime. 12/30/20   Dohmeier, Asencion Partridge, MD  prednisoLONE acetate (PRED FORTE) 1 % ophthalmic suspension Place 1 drop into both eyes in the morning and at bedtime.    [provider]  Prenatal Vit-Fe Fumarate-FA (PRENATAL MULTIVITAMIN) TABS tablet Take 1 tablet by mouth daily at 12 noon.    [provider]  RESTASIS 0.05 % ophthalmic emulsion 1 drop 2 (two) times daily. 11/11/20   [provider]  Rimegepant Sulfate (NURTEC) 75 MG TBDP Take 75 mg by mouth  daily as needed (take for abortive therapy of migraine, no more than 1 tablet in 24 hours or 10 per month). 12/30/20   Dohmeier, Asencion Partridge, MD  triamcinolone cream (KENALOG) 0.1 % Apply 1 application. topically 2 (two) times daily. 06/25/21   Lindell Spar, MD  valACYclovir (VALTREX) 500 MG tablet Take 500 mg by mouth daily as needed (Flair up take Twice a day  for 3 days and stop).  01/03/18   [provider]  Wheat Dextrin (BENEFIBER DRINK MIX PO) Take by mouth.    [provider]    Family History Family History  Problem Relation Age of Onset   Healthy Mother    Healthy Father    Colon cancer Neg Hx    Colon polyps Neg Hx     Social History Social History   Tobacco Use   Smoking status: Every Day    Packs/day: 0.50    Years: 20.00    Total pack years: 10.00    Types: Cigarettes    Start date: 01/20/1988   Smokeless tobacco: Never  Vaping Use   Vaping Use: Never used  Substance Use Topics   Alcohol use: No    Alcohol/week: 0.0 standard drinks of alcohol   Drug use: No     Allergies   Codeine, Penicillins, Divalproex sodium, Meloxicam, Sonata [zaleplon], Topamax [topiramate], Aciphex [rabeprazole], Aimovig [erenumab-aooe], and Galcanezumab-gnlm   Review of Systems Review of Systems Per HPI  Physical Exam Triage Vital Signs ED Triage Vitals [09/10/21 1536]  Enc Vitals Group      BP (!) 141/93     Pulse Rate (!) 158     Resp 20     Temp 98.6 F (37 C)     Temp Source Oral     SpO2 96 %     Weight      Height      Head Circumference      Peak Flow      Pain Score      Pain Loc      Pain Edu?      Excl. in [place]?    No data found.  Updated Vital Signs BP (!) 141/93 (BP Location: Right Arm)   Pulse (!) 158   Temp 98.6 F (37 C) (Oral)   Resp 20   LMP 10/07/2011   SpO2 96%   Visual Acuity Right Eye Distance:   Left Eye Distance:   Bilateral Distance:    Right Eye Near:   Left Eye Near:    Bilateral Near:     Physical Exam Vitals and nursing note reviewed.  Constitutional:      General: She is not in acute distress.    Appearance: Normal appearance. She is obese.  HENT:     Mouth/Throat:     Mouth: Mucous membranes are moist.     Pharynx: Oropharynx is clear.  Eyes:     General: No scleral icterus.    Extraocular Movements: Extraocular movements intact.  Cardiovascular:     Rate and Rhythm: Tachycardia present.  Pulmonary:     Effort: Pulmonary effort is normal. No respiratory distress.     Breath sounds: Normal breath sounds. No wheezing, rhonchi or rales.  Skin:    Capillary Refill: Capillary refill takes less than 2 seconds.     Coloration: Skin is pale.     Findings: No erythema.  Neurological:     Mental Status: She is alert and oriented to person, place, and time.  Psychiatric:  Behavior: Behavior is cooperative.      UC Treatments / Results  Labs (all labs ordered are listed, but only abnormal results are displayed) Labs Reviewed - No data to display  EKG   Radiology No results found.  Procedures Procedures (including critical care time)  Medications Ordered in UC Medications - No data to display  Initial Impression / Assessment and Plan / UC Course  I have reviewed the triage vital signs and the nursing notes.  Pertinent labs & imaging results that were available during my care of the patient were  reviewed by me and considered in my medical decision making (see chart for details).    Patient is a pleasant ill-appearing 56 year old female presenting for tachycardia, dizziness today.  EKG shows wide QRS complex, likely SVT.  Discussed with patient that she will need emergent transportation to nearest hospital via EMS.  Patient is in agreement to plan. EMS called, IV started.  Patient left urgent care in stable condition. Final Clinical Impressions(s) / UC Diagnoses   Final diagnoses:  Tachycardia  Dizziness  Nonintractable headache, unspecified chronicity pattern, unspecified headache type   Discharge Instructions   None    ED Prescriptions   None    PDMP not reviewed this encounter.   Eulogio Bear, NP 09/10/21 [phone]

## 2021-09-10 NOTE — Assessment & Plan Note (Addendum)
Electrolytes stable. Continue outpatient psychiatry follow-up

## 2021-09-10 NOTE — H&P (Signed)
History and Physical    KYNZLI REASE URK:[id number] DOB: 08/17/1965 DOA: 09/10/2021  PCP: Lindell Spar, MD   Patient coming from: Home  I have personally briefly reviewed patient's old medical records in [place]  Chief Complaint: Palpitations  HPI: Jennifer [name] is a 56 y.o. female with medical history significant for bipolar disorder, anxiety and depression, bulimia. Patient presented today with complaints of feeling her heart beating fast this morning when she got up to take her dog out, felt like she was going to pass out while walking her dog.  She reports associated dizziness, feeling that her chest was tight.  She went to urgent care EKG was done showed heart rate of 134, it was read as SVT , and patient was sent to [facility]. Denies IV drug use or illicit drug use.  Smokes 1 pack of cigarettes daily.  No premature family history of cardiac disease. Reports bulimia, she vomits once every night, she reports chronic diarrhea that is unchanged 4-5 times a week.  ED Course: Temperature 98.6.  Heart rate 73 - 94.  Respiratory rate 13-23.  Blood pressure systolic 831-517.  Sats greater than 97% on room air.  Troponin 180 >> 209.  Potassium 3.7, magnesium 2.2. EDP  looked at EKG from urgent care, it showed sinus tachycardia. EDP talked to cardiology, recommended trending troponins, cardiology team to see in the morning.  Aspirin given.  Hospitalist to admit.  Review of Systems: As per HPI all other systems reviewed and negative.  Past Medical History:  Diagnosis Date   Abdominal pain, epigastric 10/07/2018   Acute non-recurrent maxillary sinusitis 11/07/2019   Anxiety    Arthritis    Binge-eating and purging type anorexia nervosa    Bipolar affective disorder ([place]) 12/04/2015   Bipolar affective disorder, current episode manic without psychotic symptoms ([place]) 12/04/2015   Bipolar disorder ([place])    Cellulitis and abscess of buttock 02/15/2013   Cystitis, interstitial     Depression    Depression    Phreesia 01/28/2020   Esophageal polyp    about 20 years ago   GERD (gastroesophageal reflux disease)    Left wrist pain 03/22/2019   OCD (obsessive compulsive disorder)    Palpitations 05/25/2014   Rectal pain 12/06/2018   Rib pain on right side 01/31/2019   Rosacea 03/27/2019   Sprain of temporomandibular joint or ligament 01/10/2019    Past Surgical History:  Procedure Laterality Date   BLADDER SURGERY     CHOLECYSTECTOMY     COLONOSCOPY WITH PROPOFOL N/A 10/05/2019   Procedure: COLONOSCOPY WITH PROPOFOL;  Surgeon: Daneil Dolin, MD;  [facility];  Service: Endoscopy;  Laterality: N/A;  12:45pm   COSMETIC SURGERY N/A    Phreesia 01/28/2020   ESOPHAGOGASTRODUODENOSCOPY  09/28/2016   [facility]; Dr. Therisa Doyne; erosions in the esophagus, 5 cm hiatal hernia, nonbleeding erosive gastropathy s/p biopsied, normal duodenum.  Path with chronic inactive gastritis, no H. pylori or intestinal metaplasia.    ESOPHAGOGASTRODUODENOSCOPY (EGD) WITH PROPOFOL N/A 10/17/2018   Dr. Gala Romney: mild reflux esophagitis, small hiatal hernia   POLYPECTOMY  10/05/2019   Procedure: POLYPECTOMY;  Surgeon: Daneil Dolin, MD;  [facility];  Service: Endoscopy;;   VOCAL CORD LATERALIZATION, ENDOSCOPIC APPROACH W/ MLB       reports that she has been smoking cigarettes. She started smoking about 33 years ago. She has a 10.00 pack-year smoking history. She has never used smokeless tobacco. She reports that  she does not drink alcohol and does not use drugs.  Allergies  Allergen Reactions   Codeine Shortness Of Breath, Nausea Only and Rash   Penicillins Hives, Shortness Of Breath, Swelling and Rash    Has patient had a PCN reaction causing immediate rash, facial/tongue/throat swelling, SOB or lightheadedness with hypotension: yes Has patient had a PCN reaction causing severe rash involving mucus membranes or skin necrosis: no Has patient had a PCN reaction that  required hospitalization: no Has patient had a PCN reaction occurring within the last 10 years: no If all of the above answers are "NO", then may proceed with Cephalosporin use.;      Divalproex Sodium Hives, Itching and Rash   Meloxicam Other (See Comments)    Possible chest tightness - instructed by MD not to take   Sonata [Zaleplon] Other (See Comments)    Hallucinations   Topamax [Topiramate] Other (See Comments)    Low BP and dizziness   Aciphex [Rabeprazole] Swelling   Aimovig [Erenumab-Aooe] Itching   Galcanezumab-Gnlm Rash and Hives    Family History  Problem Relation Age of Onset   Healthy Mother    Healthy Father    Colon cancer Neg Hx    Colon polyps Neg Hx     Prior to Admission medications   Medication Sig Start Date End Date Taking? Authorizing Provider  Brexpiprazole ([name]) 4 MG TABS Take 4 mg by mouth in the morning. 12/27/20  Yes Hurst, Teresa T, PA-C  cholestyramine (QUESTRAN) 4 g packet Take 1 packet (4 g total) by mouth 2 (two) times daily. 08/08/21  Yes Lindell Spar, MD  clonazePAM (KLONOPIN) 0.5 MG tablet Take 1 tablet (0.5 mg total) by mouth 3 (three) times daily as needed for anxiety. 09/09/21  Yes [name] T, PA-C  cromolyn (OPTICROM) 4 % ophthalmic solution Place 1 drop into both eyes 2 (two) times daily. 11/16/20  Yes [provider]  diclofenac Sodium (VOLTAREN) 1 % GEL Apply 4 g topically 4 (four) times daily. 11/19/20  Yes Lindell Spar, MD  dicyclomine (BENTYL) 20 MG tablet Take 1 tablet (20 mg total) by mouth 3 (three) times daily as needed for spasms (Diarrhea). 07/31/21  Yes Lindell Spar, MD  Famotidine-Ca Carb-Mag Hydrox [facility] COMPLETE PO) Take 1 tablet by mouth daily.   Yes [provider]  fluvoxaMINE (LUVOX) 100 MG tablet Take 3 tablets (300 mg total) by mouth at bedtime. 07/08/21  Yes Hurst, Helene Kelp T, PA-C  gabapentin (NEURONTIN) 600 MG tablet 2 p.o. every morning, 1 p.o. q. afternoon, 2 p.o. nightly. 09/09/21   Yes [name] T, PA-C  hydrOXYzine (ATARAX) 25 MG tablet Take 1-2 tablets (25-50 mg total) by mouth every 8 (eight) hours as needed. 12/27/20  Yes Hurst, Teresa T, PA-C  Lactobacillus-Inulin (CULTURELLE DIGESTIVE DAILY PO) Take by mouth.   Yes [provider]  loperamide (IMODIUM) 2 MG capsule Take 2 mg by mouth as needed for diarrhea or loose stools.   Yes [provider]  loratadine (CLARITIN) 10 MG tablet Take 5 mg by mouth daily as needed for allergies.    Yes [provider]  mirtazapine (REMERON) 15 MG tablet Take 1 tablet (15 mg total) by mouth at bedtime. 07/08/21  Yes Hurst, Helene Kelp T, PA-C  naproxen (NAPROSYN) 500 MG tablet Take 500 mg by mouth 2 (two) times daily as needed for migraine.   Yes [provider]  ondansetron (ZOFRAN-ODT) 4 MG disintegrating tablet Take 1 tablet (4 mg total)  by mouth every 8 (eight) hours as needed for nausea or vomiting. 12/31/20  Yes Dohmeier, Asencion Partridge, MD  pramipexole (MIRAPEX) 0.5 MG tablet Take 1 tablet (0.5 mg total) by mouth at bedtime. 12/30/20  Yes Dohmeier, Asencion Partridge, MD  prednisoLONE acetate (PRED FORTE) 1 % ophthalmic suspension Place 1 drop into both eyes in the morning and at bedtime.   Yes [provider]  Prenatal Vit-Fe Fumarate-FA (PRENATAL MULTIVITAMIN) TABS tablet Take 1 tablet by mouth daily at 12 noon.   Yes [provider]  RESTASIS 0.05 % ophthalmic emulsion 1 drop 2 (two) times daily. 11/11/20  Yes [provider]  Rimegepant Sulfate (NURTEC) 75 MG TBDP Take 75 mg by mouth daily as needed (take for abortive therapy of migraine, no more than 1 tablet in 24 hours or 10 per month). 12/30/20  Yes Dohmeier, Asencion Partridge, MD  triamcinolone cream (KENALOG) 0.1 % Apply 1 application. topically 2 (two) times daily. 06/25/21  Yes Lindell Spar, MD  valACYclovir (VALTREX) 500 MG tablet Take 500 mg by mouth daily as needed (Flair up take Twice a day  for 3 days and stop).  01/03/18  Yes [provider]  Wheat Dextrin (BENEFIBER DRINK MIX PO) Take by mouth.   Yes [provider]  fluticasone (FLONASE) 50 MCG/ACT nasal spray Place 2 sprays into both nostrils daily. Patient not taking: Reported on 09/10/2021 02/28/21   Renee Rival, FNP    Physical Exam: Vitals:   09/10/21 1730 09/10/21 1805 09/10/21 1830 09/10/21 1900  BP: 122/86 104/82 (!) 137/97 (!) 128/90  Pulse: 84 81 77 73  Resp: '15 19 13 16  '$ Temp:      TempSrc:      SpO2: 98% 99% 97% 97%  Weight:      Height:        Constitutional: NAD, calm, comfortable Vitals:   09/10/21 1730 09/10/21 1805 09/10/21 1830 09/10/21 1900  BP: 122/86 104/82 (!) 137/97 (!) 128/90  Pulse: 84 81 77 73  Resp: '15 19 13 16  '$ Temp:      TempSrc:      SpO2: 98% 99% 97% 97%  Weight:      Height:       Eyes: PERRL, lids and conjunctivae normal ENMT: Mucous membranes are moist.   Neck: normal, supple, no masses, no thyromegaly Respiratory: clear to auscultation bilaterally, no wheezing, no crackles. Normal respiratory effort. No accessory muscle use.  Cardiovascular: Regular rate and rhythm, no murmurs / rubs / gallops. No extremity edema. 2+ pedal pulses. No carotid bruits.  Abdomen: no tenderness, no masses palpated. No hepatosplenomegaly. Bowel sounds positive.  Musculoskeletal: no clubbing / cyanosis. No joint deformity upper and lower extremities. Good ROM, no contractures. Normal muscle tone.  Skin: no rashes, lesions, ulcers. No induration Neurologic: No apparent cranial nerve abnormality moving extremities spontaneously.  Psychiatric: Normal judgment and insight. Alert and oriented x 3. Normal mood.   Labs on Admission: I have personally reviewed following labs and imaging studies  CBC: Recent Labs  Lab 09/10/21 1658  WBC 6.9  NEUTROABS 3.7  HGB 14.2  HCT 41.5  MCV 91.8  PLT 096   Basic Metabolic Panel: Recent Labs  Lab 09/10/21 1658  NA 142  K 3.7  CL 112*  CO2 26  GLUCOSE 107*  BUN 19   CREATININE 0.93  CALCIUM 9.2  MG 2.2    Radiological Exams on Admission: DG Chest Portable 1 View  Result Date: 09/10/2021 CLINICAL DATA:  Palpitations EXAM:  PORTABLE CHEST 1 VIEW COMPARISON:  Chest radiograph 10/08/2020 FINDINGS: The cardiomediastinal silhouette is normal. There is no focal consolidation or pulmonary edema. There is no pleural effusion or pneumothorax There is no acute osseous abnormality. IMPRESSION: No radiographic evidence of acute cardiopulmonary process. Electronically Signed   By: Valetta Mole M.D.   On: 09/10/2021 16:41    EKG: Independently reviewed.  Sinus rhythm rate 93.  QTc 415.  No significant change from prior.  Assessment/Plan Principal Problem:   Tachycardia Active Problems:   Elevated troponin   OCD (obsessive compulsive disorder)   Mixed bipolar I disorder (HCC)   Generalized anxiety disorder   Bulimia   Assessment and Plan: * Tachycardia Symptomatic with dizziness, palpitations.  Initial EKG from urgent care showed sinus tachycardia with rate of 134.  Heart rate has ranged from 73-94 here.  Blood pressure systolic ranging from 211-155.  Recent lipid panel 08/01/2021, LDL of 86, total cholesterol 160. -Echocardiogram -Cardiology to see in the morning - TSH   Elevated troponin Troponin 180 > 309.  Presents with symptomatic tachycardia, she reports some chest tightness.  Likely demand ischemia from tachycardia.  Smokes a pack of cigarettes daily.  No personal or premature family history of cardiac disease.  -Echocardiogram -Trend troponin -N.p.o. midnight - -Not started on aspirin - allergy to meloxicam "Possible chest tightness',      Bulimia Electrolytes stable.  Mixed bipolar I disorder (HCC) Resume brexpiprazole, fluvoxamine, mirtazapine,   DVT prophylaxis: Lovenox Code Status: Full Family Communication: Spouse at bedside Disposition Plan: ~ 1 - 2 days Consults called: Cards Admission status:  Obs tele     Author: Bethena Roys, MD 09/10/2021 9:32 PM  For on call review [email or web address].

## 2021-09-10 NOTE — ED Notes (Signed)
Patient is being discharged from the Urgent Care and sent to the Emergency Department via EMS . Per NP, patient is in need of higher level of care due to tachycardia. Patient is aware and verbalizes understanding of plan of care.  Vitals:   09/10/21 1536  BP: (!) 141/93  Pulse: (!) 158  Resp: 20  Temp: 98.6 F (37 C)  SpO2: 96%

## 2021-09-11 ENCOUNTER — Observation Stay (HOSPITAL_BASED_OUTPATIENT_CLINIC_OR_DEPARTMENT_OTHER): Payer: Medicare PPO

## 2021-09-11 ENCOUNTER — Encounter: Payer: Self-pay | Admitting: Physician Assistant

## 2021-09-11 DIAGNOSIS — F429 Obsessive-compulsive disorder, unspecified: Secondary | ICD-10-CM | POA: Diagnosis not present

## 2021-09-11 DIAGNOSIS — I471 Supraventricular tachycardia: Secondary | ICD-10-CM | POA: Diagnosis not present

## 2021-09-11 DIAGNOSIS — R000 Tachycardia, unspecified: Secondary | ICD-10-CM | POA: Diagnosis not present

## 2021-09-11 DIAGNOSIS — R778 Other specified abnormalities of plasma proteins: Secondary | ICD-10-CM | POA: Diagnosis not present

## 2021-09-11 LAB — CBC
HCT: 42.4 % (ref 36.0–46.0)
Hemoglobin: 13.9 g/dL (ref 12.0–15.0)
MCH: 31 pg (ref 26.0–34.0)
MCHC: 32.8 g/dL (ref 30.0–36.0)
MCV: 94.6 fL (ref 80.0–100.0)
Platelets: 160 10*3/uL (ref 150–400)
RBC: 4.48 MIL/uL (ref 3.87–5.11)
RDW: 13.5 % (ref 11.5–15.5)
WBC: 6.2 10*3/uL (ref 4.0–10.5)
nRBC: 0 % (ref 0.0–0.2)

## 2021-09-11 LAB — ECHOCARDIOGRAM COMPLETE
AR max vel: 4.27 cm2
AV Area VTI: 4.39 cm2
AV Area mean vel: 4.01 cm2
AV Mean grad: 5 mmHg
AV Peak grad: 9 mmHg
Ao pk vel: 1.5 m/s
Area-P 1/2: 3.58 cm2
Calc EF: 62.1 %
Height: 64.25 in
MV VTI: 5.86 cm2
S' Lateral: 2.6 cm
Single Plane A2C EF: 65.6 %
Single Plane A4C EF: 63.2 %
Weight: 2880 oz

## 2021-09-11 LAB — BASIC METABOLIC PANEL
Anion gap: 7 (ref 5–15)
BUN: 16 mg/dL (ref 6–20)
CO2: 26 mmol/L (ref 22–32)
Calcium: 9.2 mg/dL (ref 8.9–10.3)
Chloride: 109 mmol/L (ref 98–111)
Creatinine, Ser: 0.83 mg/dL (ref 0.44–1.00)
GFR, Estimated: >60 mL/min (ref >60)
Glucose, Bld: 107 mg/dL — ABNORMAL HIGH (ref 70–99)
Potassium: 4 mmol/L (ref 3.5–5.1)
Sodium: 142 mmol/L (ref 135–145)

## 2021-09-11 MED ORDER — DILTIAZEM HCL 30 MG PO TABS: 30.0000 mg | ORAL_TABLET | Freq: Two times a day (BID) | ORAL | 1 refills | Status: DC

## 2021-09-11 MED ORDER — CROMOLYN SODIUM 4 % OP SOLN
1.0000 [drp] | Freq: Two times a day (BID) | OPHTHALMIC | Status: DC
  Filled 2021-09-11: qty 10

## 2021-09-11 MED ORDER — PREDNISOLONE ACETATE 1 % OP SUSP
1.0000 [drp] | Freq: Two times a day (BID) | OPHTHALMIC | Status: DC
  Administered 2021-09-11: 1 [drp] via OPHTHALMIC
  Filled 2021-09-11: qty 1

## 2021-09-11 MED ORDER — DILTIAZEM HCL 30 MG PO TABS
30.0000 mg | ORAL_TABLET | Freq: Two times a day (BID) | ORAL | Status: DC
  Administered 2021-09-11: 30 mg via ORAL
  Filled 2021-09-11: qty 1

## 2021-09-11 NOTE — Consult Note (Addendum)
Cardiology Consultation:   Patient ID: Jennifer [name] MRN: [id number]; DOB: 1965/12/10  Admit date: 09/10/2021 Date of Consult: 09/11/2021  PCP:  Lindell Spar, MD   [facility] Providers Cardiologist:  New to Dr. Harl Bowie (remotely saw Dr. Bronson Ing for general cardiology and Dr. Gwenlyn Found who excluded PAD)    Patient Profile:   Jennifer [name] is a 56 y.o. female with a hx of tobacco abuse, bipolar disorder, anxiety, depression, eating disorder, OCD who is being seen 09/11/2021 for the evaluation of palpitations and elevated troponin at the request of Dr. Denton Brick.  History of Present Illness:   Jennifer [name] remotely saw Dr. Bronson Ing in 2016 for palpitations with normal echo and monitor. She saw Dr. Gwenlyn Found in 2/023 for lower extremity discomfort he felt was unrelated to PAD. No family hx of CAD (paternal 11 had PPM).  In general the patient reports she walks her dog regularly and feels great when doing so. However, over the last year she's had 2 episodes of abrupt heart racing that improved with deep breathing and increased fluid intake. This was different than symptoms in 2016. Yesterday she woke up in [place] but didn't feel well as the morning went on. She went to therapy and while driving to meet her family she felt onset of palpitations around 11:15am that she could feel as a pounding in her chest and head associated with some lightheadedness and labored breathing. For the next several hours she thought if she ate something and rested that she would feel better. Before 3pm she took a nap but when she woke up the racing was still there so she called her husband and went to urgent care where EKG showed narrow complex tachycardia at 134bpm, read as SVT by confirmed EKG with nonspecific changes. She was transported to the ER where f/u EKG showed NSR otherwise normal. There were no specific medicines given to aid in conversion. Labs notable for hsTroponin 180->309->251, K 3.7, Mg 2.2, Cl? at 112,  CBC wnl, TSH wnl, UDS neg. CXR NAD. She was treated with IV fluids. She was not started on ASA due to allergy to meloxicam with possible chest tightness. This AM she feels as though her  nose might be stopped up "like I need oxygen on" but otherwise pounding resolved and remains in NSR overnight. She reports smoking 3 cigarettes and drinking about 1 cup of Mountain Dew yesterday - she states she wants to be allowed to drink at least one a day going forward.    Past Medical History:  Diagnosis Date   Abdominal pain, epigastric 10/07/2018   Acute non-recurrent maxillary sinusitis 11/07/2019   Anxiety    Arthritis    Binge-eating and purging type anorexia nervosa    Bipolar affective disorder ([place]) 12/04/2015   Bipolar affective disorder, current episode manic without psychotic symptoms ([place]) 12/04/2015   Bipolar disorder ([place])    Cellulitis and abscess of buttock 02/15/2013   Cystitis, interstitial    Depression    Depression    Phreesia 01/28/2020   Esophageal polyp    about 20 years ago   GERD (gastroesophageal reflux disease)    Left wrist pain 03/22/2019   OCD (obsessive compulsive disorder)    Palpitations 05/25/2014   Rectal pain 12/06/2018   Rib pain on right side 01/31/2019   Rosacea 03/27/2019   Sprain of temporomandibular joint or ligament 01/10/2019    Past Surgical History:  Procedure Laterality Date   BLADDER SURGERY     CHOLECYSTECTOMY  COLONOSCOPY WITH PROPOFOL N/A 10/05/2019   Procedure: COLONOSCOPY WITH PROPOFOL;  Surgeon: Daneil Dolin, MD;  [facility];  Service: Endoscopy;  Laterality: N/A;  12:45pm   COSMETIC SURGERY N/A    Phreesia 01/28/2020   ESOPHAGOGASTRODUODENOSCOPY  09/28/2016   [facility]; Dr. Therisa Doyne; erosions in the esophagus, 5 cm hiatal hernia, nonbleeding erosive gastropathy s/p biopsied, normal duodenum.  Path with chronic inactive gastritis, no H. pylori or intestinal metaplasia.    ESOPHAGOGASTRODUODENOSCOPY (EGD) WITH PROPOFOL  N/A 10/17/2018   Dr. Gala Romney: mild reflux esophagitis, small hiatal hernia   POLYPECTOMY  10/05/2019   Procedure: POLYPECTOMY;  Surgeon: Daneil Dolin, MD;  Location: [facility];  Service: Endoscopy;;   VOCAL CORD LATERALIZATION, ENDOSCOPIC APPROACH W/ MLB       Home Medications:  Prior to Admission medications   Medication Sig Start Date End Date Taking? Authorizing Provider  Brexpiprazole ([name]) 4 MG TABS Take 4 mg by mouth in the morning. 12/27/20  Yes Hurst, Teresa T, PA-C  cholestyramine (QUESTRAN) 4 g packet Take 1 packet (4 g total) by mouth 2 (two) times daily. 08/08/21  Yes Lindell Spar, MD  clonazePAM (KLONOPIN) 0.5 MG tablet Take 1 tablet (0.5 mg total) by mouth 3 (three) times daily as needed for anxiety. 09/09/21  Yes [name] T, PA-C  cromolyn (OPTICROM) 4 % ophthalmic solution Place 1 drop into both eyes 2 (two) times daily. 11/16/20  Yes [provider]  diclofenac Sodium (VOLTAREN) 1 % GEL Apply 4 g topically 4 (four) times daily. 11/19/20  Yes Lindell Spar, MD  dicyclomine (BENTYL) 20 MG tablet Take 1 tablet (20 mg total) by mouth 3 (three) times daily as needed for spasms (Diarrhea). 07/31/21  Yes Lindell Spar, MD  Famotidine-Ca Carb-Mag Hydrox [facility] COMPLETE PO) Take 1 tablet by mouth daily.   Yes [provider]  fluvoxaMINE (LUVOX) 100 MG tablet Take 3 tablets (300 mg total) by mouth at bedtime. 07/08/21  Yes Hurst, Helene Kelp T, PA-C  gabapentin (NEURONTIN) 600 MG tablet 2 p.o. every morning, 1 p.o. q. afternoon, 2 p.o. nightly. 09/09/21  Yes [name] T, PA-C  hydrOXYzine (ATARAX) 25 MG tablet Take 1-2 tablets (25-50 mg total) by mouth every 8 (eight) hours as needed. 12/27/20  Yes Hurst, Teresa T, PA-C  Lactobacillus-Inulin (CULTURELLE DIGESTIVE DAILY PO) Take by mouth.   Yes [provider]  loperamide (IMODIUM) 2 MG capsule Take 2 mg by mouth as needed for diarrhea or loose stools.   Yes [provider]  loratadine  (CLARITIN) 10 MG tablet Take 5 mg by mouth daily as needed for allergies.    Yes [provider]  mirtazapine (REMERON) 15 MG tablet Take 1 tablet (15 mg total) by mouth at bedtime. 07/08/21  Yes Hurst, Helene Kelp T, PA-C  naproxen (NAPROSYN) 500 MG tablet Take 500 mg by mouth 2 (two) times daily as needed for migraine.   Yes [provider]  ondansetron (ZOFRAN-ODT) 4 MG disintegrating tablet Take 1 tablet (4 mg total) by mouth every 8 (eight) hours as needed for nausea or vomiting. 12/31/20  Yes Dohmeier, Asencion Partridge, MD  pramipexole (MIRAPEX) 0.5 MG tablet Take 1 tablet (0.5 mg total) by mouth at bedtime. 12/30/20  Yes Dohmeier, Asencion Partridge, MD  prednisoLONE acetate (PRED FORTE) 1 % ophthalmic suspension Place 1 drop into both eyes in the morning and at bedtime.   Yes [provider]  Prenatal Vit-Fe Fumarate-FA (PRENATAL MULTIVITAMIN) TABS tablet Take 1 tablet by  mouth daily at 12 noon.   Yes [provider]  RESTASIS 0.05 % ophthalmic emulsion 1 drop 2 (two) times daily. 11/11/20  Yes [provider]  Rimegepant Sulfate (NURTEC) 75 MG TBDP Take 75 mg by mouth daily as needed (take for abortive therapy of migraine, no more than 1 tablet in 24 hours or 10 per month). 12/30/20  Yes Dohmeier, Asencion Partridge, MD  triamcinolone cream (KENALOG) 0.1 % Apply 1 application. topically 2 (two) times daily. 06/25/21  Yes Lindell Spar, MD  valACYclovir (VALTREX) 500 MG tablet Take 500 mg by mouth daily as needed (Flair up take Twice a day  for 3 days and stop).  01/03/18  Yes [provider]  Wheat Dextrin (BENEFIBER DRINK MIX PO) Take by mouth.   Yes [provider]  fluticasone (FLONASE) 50 MCG/ACT nasal spray Place 2 sprays into both nostrils daily. Patient not taking: Reported on 09/10/2021 02/28/21   Renee Rival, FNP    Inpatient Medications: Scheduled Meds:  brexpiprazole  4 mg Oral Daily   cholestyramine  4 g Oral BID   cycloSPORINE  1 drop Both Eyes  BID   enoxaparin (LOVENOX) injection  40 mg Subcutaneous Q24H   fluvoxaMINE  300 mg Oral QHS   gabapentin  300 mg Oral Q1500   gabapentin  600 mg Oral BID   mirtazapine  15 mg Oral QHS   pramipexole  0.5 mg Oral QHS   Continuous Infusions:  0.45 % NaCl with KCl 20 mEq / L 100 mL/hr at 09/10/21 2152   PRN Meds: acetaminophen **OR** acetaminophen, dicyclomine, ondansetron **OR** ondansetron (ZOFRAN) IV, polyethylene glycol, valACYclovir  Allergies:    Allergies  Allergen Reactions   Codeine Shortness Of Breath, Nausea Only and Rash   Penicillins Hives, Shortness Of Breath, Swelling and Rash    Has patient had a PCN reaction causing immediate rash, facial/tongue/throat swelling, SOB or lightheadedness with hypotension: yes Has patient had a PCN reaction causing severe rash involving mucus membranes or skin necrosis: no Has patient had a PCN reaction that required hospitalization: no Has patient had a PCN reaction occurring within the last 10 years: no If all of the above answers are "NO", then may proceed with Cephalosporin use.;      Divalproex Sodium Hives, Itching and Rash   Meloxicam Other (See Comments)    Possible chest tightness - instructed by MD not to take   Sonata [Zaleplon] Other (See Comments)    Hallucinations   Topamax [Topiramate] Other (See Comments)    Low BP and dizziness   Aciphex [Rabeprazole] Swelling   Aimovig [Erenumab-Aooe] Itching   Galcanezumab-Gnlm Rash and Hives    Social History:   Social History   Socioeconomic History   Marital status: Married    Spouse name: Todd   Number of children: 0   Years of education: Not on file   Highest education level: Not on file  Occupational History   Not on file  Tobacco Use   Smoking status: Every Day    Packs/day: 0.50    Years: 20.00    Total pack years: 10.00    Types: Cigarettes    Start date: 01/20/1988   Smokeless tobacco: Never  Vaping Use   Vaping Use: Never used  Substance and Sexual  Activity   Alcohol use: No    Alcohol/week: 0.0 standard drinks of alcohol   Drug use: No   Sexual activity: Yes    Birth control/protection: None  Other Topics Concern  Not on file  Social History Narrative   Lives with husband -[name] of 11 years       Jennifer [name]      Enjoys: reading-all genres      Diet: eats all food groups   Caffeine: 1 cup daily at times, diet dr pepper daily   Water: gatorade  zero and water       Wears seat belt    Does not use phone while driving   Smoke and Development worker, 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         Social Determinants of Health   Financial Resource Strain: Low Risk  (10/11/2020)   Overall Financial Resource Strain (CARDIA)    Difficulty of Paying Living Expenses: Not hard at all  Food Insecurity: No Food Insecurity (10/11/2020)   Hunger Vital Sign    Worried About Running Out of Food in the Last Year: Never true    [place] in the Last Year: Never true  Transportation Needs: No Transportation Needs (10/11/2020)   PRAPARE - Hydrologist (Medical): No    Lack of Transportation (Non-Medical): No  Physical Activity: Insufficiently Active (10/11/2020)   Exercise Vital Sign    Days of Exercise per Week: 7 days    Minutes of Exercise per Session: 20 min  Stress: Stress Concern Present (10/11/2020)   [place]    Feeling of Stress : To some extent  Social Connections: Moderately Integrated (10/11/2020)   Social Connection and Isolation Panel [NHANES]    Frequency of Communication with Friends and Family: More than three times a week    Frequency of Social Gatherings with Friends and Family: Once a week    Attends Religious Services: More than 4 times per year    Active Member of [employer] or Organizations: No    Attends Archivist Meetings: Never    Marital Status: Married  Human resources officer Violence: Unknown  (10/11/2020)   Humiliation, Afraid, Rape, and Kick questionnaire    Fear of Current or Ex-Partner: No    Emotionally Abused: No    Physically Abused: No    Sexually Abused: Not on file    Family History:    Family History  Problem Relation Age of Onset   Healthy Mother    Healthy Father    Colon cancer Neg Hx    Colon polyps Neg Hx      ROS:  Please see the history of present illness.   All other ROS reviewed and negative.     Physical Exam/Data:   Vitals:   09/11/21 0234 09/11/21 0300 09/11/21 0333 09/11/21 0355  BP: 113/77 115/86 116/80 (!) 141/121  Pulse: 71 75 68 80  Resp: '16  18 19  '$ Temp:   98.1 F (36.7 C) 99.5 F (37.5 C)  TempSrc:   Oral   SpO2: 93% 93% 96% 97%  Weight:      Height:        Intake/Output Summary (Last 24 hours) at 09/11/2021 0803 Last data filed at 09/11/2021 0400 Gross per 24 hour  Intake 613.33 ml  Output --  Net 613.33 ml      09/10/2021    4:09 PM 08/08/2021    9:45 AM 06/25/2021    3:11 PM  Last 3 Weights  Weight (lbs) 180 lb 182 lb 12.8 oz 177 lb 9.6 oz  Weight (kg) 81.647  kg 82.918 kg 80.559 kg     Body mass index is 30.66 kg/m.  General: Well developed, well nourished WF, in no acute distress. Head: Normocephalic, atraumatic, sclera non-icteric, no xanthomas, nares are without discharge. Neck: Negative for carotid bruits. JVP not elevated. Lungs: Rhonchorous throughout with good air movement but otherwise no wheezing or rales. Breathing is unlabored. Heart: RRR S1 S2 without murmurs, rubs, or gallops.  Abdomen: Soft, non-tender, non-distended with normoactive bowel sounds. No rebound/guarding. Extremities: No clubbing or cyanosis. No edema. Distal pedal pulses are 2+ and equal bilaterally. Neuro: Alert and oriented X 3. Moves all extremities spontaneously. Psych:  Responds to questions appropriately with a normal affect.   EKG:  The EKG was personally reviewed and demonstrates:   First tracing: Narrow complex tachycardia/SVT  134bpm, NSIVCD, nonspecific STTW changes Follow-up: NSR 93bpm, otherwise unremarkable  Telemetry:  Telemetry was personally reviewed and demonstrates:  NSR  Relevant CV Studies: Echo 2016  - Left ventricle: The cavity size was normal. Wall thickness was    normal. Systolic function was normal. The estimated ejection    fraction was in the range of 55% to 60%.   Monitor 2016 Full report not released but per Dr. Bronson Ing "Notes Recorded by Herminio Commons, MD on 06/19/2014 at 12:58 PM Sinus rhythm and sinus tachycardia. No significant arrhythmias. Will discuss further at [facility]."  Laboratory Data:  High Sensitivity Troponin:   Recent Labs  Lab 09/10/21 1658 09/10/21 1801 09/10/21 2150  TROPONINIHS 180* 309* 251*     Chemistry Recent Labs  Lab 09/10/21 1658  NA 142  K 3.7  CL 112*  CO2 26  GLUCOSE 107*  BUN 19  CREATININE 0.93  CALCIUM 9.2  MG 2.2  GFRNONAA >60  ANIONGAP 4*    No results for input(s): "PROT", "ALBUMIN", "AST", "ALT", "ALKPHOS", "BILITOT" in the last 168 hours. Lipids No results for input(s): "CHOL", "TRIG", "HDL", "LABVLDL", "LDLCALC", "CHOLHDL" in the last 168 hours.  Hematology Recent Labs  Lab 09/10/21 1658  WBC 6.9  RBC 4.52  HGB 14.2  HCT 41.5  MCV 91.8  MCH 31.4  MCHC 34.2  RDW 13.5  PLT 169   Thyroid  Recent Labs  Lab 09/10/21 2033  TSH 1.335    BNPNo results for input(s): "BNP", "PROBNP" in the last 168 hours.  DDimer No results for input(s): "DDIMER" in the last 168 hours.   Radiology/Studies:  DG Chest Portable 1 View  Result Date: 09/10/2021 CLINICAL DATA:  Palpitations EXAM: PORTABLE CHEST 1 VIEW COMPARISON:  Chest radiograph 10/08/2020 FINDINGS: The cardiomediastinal silhouette is normal. There is no focal consolidation or pulmonary edema. There is no pleural effusion or pneumothorax There is no acute osseous abnormality. IMPRESSION: No radiographic evidence of acute cardiopulmonary process. Electronically Signed   By:  Valetta Mole M.D.   On: 09/10/2021 16:41     Assessment and Plan:   1. Tachypalpitations with possible SVT - EKG read by Dr. Curt Bears as SVT, symptoms fit this diagnosis as well - await echocardiogram - consider beta blocker versus diltiazem - has rhonchi on exam but no wheezing - will review clinical case with Dr. Harl Bowie  2. Elevated troponin - hsTroponin trend [phone], suspect demand ischemia in context of persistent tachycardia for over 4 hours - outside of arrhythmia is not really having any typical anginal symptoms - since troponins were downtrending by last check, do not feel we need to continue to draw q2hours - holding off ASA due to meloxicam allergy per notes -  await echocardiogram  3. Tobacco abuse - rhonchorous BS on auscultation - CXR clear - patient reports she initially delayed care because she did not want to be lectured by husband about stopping smoking, sensitive topic for patient  4. Bipolar disorder, bulimia, OCD - per primary team  Risk Assessment/Risk Scores:                For questions or updates, please contact [place] Please consult [email or web address] for contact info under    Signed, Charlie Pitter, PA-C  09/11/2021 8:03 AM  Attending note  Patient seen and discussed with PA Dunn, I agree with her documentation. 56 yo female history of tachcyardia/palpitations with prior monitor showing inappropriate sinus tach, bipoloar, tobacco abuse, presents with palpitations with dizziness and chest pain. Presented to urgent care, from notes found to be in SVT.    WBC 6.9 Hgb 14.2 Plt 169 K 3.7 BUN 19 Cr 0.93 Mg 2.2 TSH 1.33  Trop 180-->309-->251 UDS neg CXR no acute process EKG SVT 130, no ischemic changes. Subsequent EKG NSR Echo pending    1.Palpitations/SVT - history of tachycardia/palpitations - from prior notes managed for palpitations/inappropriate sinus tach, was on dilt in the past however now off. She does not recall being on dilt or  when and why stopped - EKG shows SVT on presentation. Relatively mild trop elevation consistent with persistent tachycardia, trending down - f/u echo, if no significant abnormalities would start back either dilt or metoprolol pending EF - no plans for ischemic testing at this time.    Addendum 1047AM Echo shows normal LV function, no WMAs. Start diltiazem '30mg'$  bid, can take additional '30mg'$  prn for palpitations. [place] for discharge, we will sign off inpatient care and arrange outpatient f/u.   Carlyle Dolly MD

## 2021-09-11 NOTE — Discharge Summary (Signed)
Physician Discharge Summary   Patient: Jennifer [name] MRN: [id number] DOB: [birth year]  Admit date:     09/10/2021  Discharge date: 09/11/21  Discharge Physician: Shanon Brow Braxtyn Dorff   PCP: Lindell Spar, MD   Recommendations at discharge:   Please follow up with primary care provider within 1-2 weeks  Please repeat BMP and CBC in one week  Please follow up on/with cardiology in 3 weeks   Hospital Course: 56 year old female with a history of bipolar disorder, OCD, bulimia presenting with palpitations and near syncope.  The patient states that she has been in her usual state of health until around 11:15 AM on 09/10/2021 when she was driving to her therapy session to meet her family.  At that time, the patient felt a pounding sensation in her chest with some lightheadedness and diaphoresis.  She states that she continued to have the symptoms for the next several hours.  She went home to take a nap hoping that she will feel better.  She woke up with continued sensation of her heart racing with some shortness of breath and lightheadedness.  She went to urgent care where an EKG was done and was interpreted as SVT with heart rate 134.  The patient was transferred to the emergency department for further evaluation.  Follow-up EKG in the emergency department showed normal sinus rhythm.  The patient was admitted for further evaluation and treatment.  BMP showed sodium 142, potassium 3.7, bicarbonate 26, serum creatinine 0.93.  Magnesium 2.2.  TSH 1.335.  UDS was negative.  Chest x-ray was negative.  Cardiology was consulted to assist with management.  Assessment and Plan: * SVT (supraventricular tachycardia) (HCC) Symptomatic with dizziness, palpitations.   Initial EKG from urgent care showed SVT with rate of 134.  -no SVT episodes since admission Recent lipid panel 08/01/2021, LDL of 86, total cholesterol 160. -8/24 Echo--EF 65-70%, no WMA, normal RV -Cardiology consult appreciated>>ok to go home as echo  reassuring ---follow office in 3 weeks ---start diltiazem 30 mg bid with 30 mg prn tachycardia - TSH 1.335   Elevated troponin Troponin 180 > 309.    -Likely demand ischemia from tachycardia.  Smokes a pack of cigarettes daily.  No personal or premature family history of cardiac disease.  -Echo 65-70%, No WMA -Trend troponin - -Not started on aspirin - allergy to meloxicam "Possible chest tightness',      Bulimia Electrolytes stable. Continue outpatient psychiatry follow-up  Mixed bipolar I disorder ([place]) Resume brexpiprazole, fluvoxamine, mirtazapine,  OCD (obsessive compulsive disorder) Continue home medications. Continue outpatient follow-up with psychiatry         Consultants: cardiology Procedures performed: none  Disposition: Home Diet recommendation:  Cardiac diet DISCHARGE MEDICATION: Allergies as of 09/11/2021       Reactions   Codeine Shortness Of Breath, Nausea Only, Rash   Penicillins Hives, Shortness Of Breath, Swelling, Rash   Has patient had a PCN reaction causing immediate rash, facial/tongue/throat swelling, SOB or lightheadedness with hypotension: yes Has patient had a PCN reaction causing severe rash involving mucus membranes or skin necrosis: no Has patient had a PCN reaction that required hospitalization: no Has patient had a PCN reaction occurring within the last 10 years: no If all of the above answers are "NO", then may proceed with Cephalosporin use.;   Divalproex Sodium Hives, Itching, Rash   Meloxicam Other (See Comments)   Possible chest tightness - instructed by MD not to take   Sonata [zaleplon] Other (See Comments)   Hallucinations  Topamax [topiramate] Other (See Comments)   Low BP and dizziness   Aciphex [rabeprazole] Swelling   Aimovig [erenumab-aooe] Itching   Galcanezumab-gnlm Rash, Hives        Medication List     STOP taking these medications    fluticasone 50 MCG/ACT nasal spray Commonly known as: FLONASE        TAKE these medications    BENEFIBER DRINK MIX PO Take by mouth.   cholestyramine 4 g packet Commonly known as: Questran Take 1 packet (4 g total) by mouth 2 (two) times daily.   clonazePAM 0.5 MG tablet Commonly known as: KlonoPIN Take 1 tablet (0.5 mg total) by mouth 3 (three) times daily as needed for anxiety.   cromolyn 4 % ophthalmic solution Commonly known as: OPTICROM Place 1 drop into both eyes 2 (two) times daily.   CULTURELLE DIGESTIVE DAILY PO Take by mouth.   diclofenac Sodium 1 % Gel Commonly known as: Voltaren Apply 4 g topically 4 (four) times daily.   dicyclomine 20 MG tablet Commonly known as: BENTYL Take 1 tablet (20 mg total) by mouth 3 (three) times daily as needed for spasms (Diarrhea).   diltiazem 30 MG tablet Commonly known as: CARDIZEM Take 1 tablet (30 mg total) by mouth 2 (two) times daily.   fluvoxaMINE 100 MG tablet Commonly known as: LUVOX Take 3 tablets (300 mg total) by mouth at bedtime.   gabapentin 600 MG tablet Commonly known as: NEURONTIN 2 p.o. every morning, 1 p.o. q. afternoon, 2 p.o. nightly.   hydrOXYzine 25 MG tablet Commonly known as: ATARAX Take 1-2 tablets (25-50 mg total) by mouth every 8 (eight) hours as needed.   loperamide 2 MG capsule Commonly known as: IMODIUM Take 2 mg by mouth as needed for diarrhea or loose stools.   loratadine 10 MG tablet Commonly known as: CLARITIN Take 5 mg by mouth daily as needed for allergies.   mirtazapine 15 MG tablet Commonly known as: REMERON Take 1 tablet (15 mg total) by mouth at bedtime.   naproxen 500 MG tablet Commonly known as: NAPROSYN Take 500 mg by mouth 2 (two) times daily as needed for migraine.   Nurtec 75 MG Tbdp Generic drug: Rimegepant Sulfate Take 75 mg by mouth daily as needed (take for abortive therapy of migraine, no more than 1 tablet in 24 hours or 10 per month).   ondansetron 4 MG disintegrating tablet Commonly known as: ZOFRAN-ODT Take 1  tablet (4 mg total) by mouth every 8 (eight) hours as needed for nausea or vomiting.   PEPCID COMPLETE PO Take 1 tablet by mouth daily.   pramipexole 0.5 MG tablet Commonly known as: MIRAPEX Take 1 tablet (0.5 mg total) by mouth at bedtime.   prednisoLONE acetate 1 % ophthalmic suspension Commonly known as: PRED FORTE Place 1 drop into both eyes in the morning and at bedtime.   prenatal multivitamin Tabs tablet Take 1 tablet by mouth daily at 12 noon.   Restasis 0.05 % ophthalmic emulsion Generic drug: cycloSPORINE 1 drop 2 (two) times daily.   [name] 4 MG Tabs Generic drug: Brexpiprazole Take 4 mg by mouth in the morning.   triamcinolone cream 0.1 % Commonly known as: KENALOG Apply 1 application. topically 2 (two) times daily.   valACYclovir 500 MG tablet Commonly known as: VALTREX Take 500 mg by mouth daily as needed (Flair up take Twice a day  for 3 days and stop).        Follow-up Information  Imogene Burn, PA-C Follow up.   Specialty: Cardiology Why: [facility] - note this will be in our [place] due to appointment availability within the time frame needed. You will see PA Ermalinda Barrios on Tuesday Sep 30, 2021 at 10:15 AM (Arrive by 10:00 AM). Contact information: [address] [place] [address] [phone]                Discharge Exam: Filed Weights   09/10/21 1609  Weight: 81.6 kg   HEENT:  [place]/AT, No thrush, no icterus CV:  RRR, no rub, no S3, no S4 Lung:  CTA, no wheeze, no rhonchi Abd:  soft/+BS, NT Ext:  No edema, no lymphangitis, no synovitis, no rash   Condition at discharge: stable  The results of significant diagnostics from this hospitalization (including imaging, microbiology, ancillary and laboratory) are listed below for reference.   Imaging Studies: ECHOCARDIOGRAM COMPLETE  Result Date: 09/11/2021    ECHOCARDIOGRAM REPORT   Patient Name:   MARCELINA MCLAURIN Date of Exam: 09/11/2021  Medical Rec #:  [id number]      Height:       64.3 in Accession #:    [id number]     Weight:       180.0 lb Date of Birth:  1965/11/18      BSA:          1.876 m Patient Age:    11 years       BP:           125/82 mmHg Patient Gender: F              HR:           68 bpm. Exam Location:  [facility] Procedure: 2D Echo, Cardiac Doppler and Color Doppler Indications:    Tachycardia  History:        Patient has prior history of Echocardiogram examinations, most                 recent 07/09/2014. Arrythmias:Tachycardia; Risk Factors:Current                 Smoker.  Sonographer:    Wenda Low Referring Phys: [place]  1. Left ventricular ejection fraction, by estimation, is 65 to 70%. The left ventricle has normal function. The left ventricle has no regional wall motion abnormalities. Left ventricular diastolic parameters were normal.  2. Right ventricular systolic function is normal. The right ventricular size is normal. There is normal pulmonary artery systolic pressure.  3. Left atrial size was mildly dilated.  4. The mitral valve is normal in structure. No evidence of mitral valve regurgitation. No evidence of mitral stenosis.  5. The aortic valve has an indeterminant number of cusps. Aortic valve regurgitation is not visualized. No aortic stenosis is present.  6. The inferior vena cava is normal in size with greater than 50% respiratory variability, suggesting right atrial pressure of 3 mmHg. FINDINGS  Left Ventricle: Left ventricular ejection fraction, by estimation, is 65 to 70%. The left ventricle has normal function. The left ventricle has no regional wall motion abnormalities. The left ventricular internal cavity size was normal in size. There is  no left ventricular hypertrophy. Left ventricular diastolic parameters were normal. Right Ventricle: The right ventricular size is normal. Right vetricular wall thickness was not well visualized. Right ventricular systolic function is  normal. There is normal pulmonary artery systolic pressure. The tricuspid regurgitant velocity is 2.54  m/s, and with an assumed right atrial pressure of 3 mmHg, the estimated right ventricular systolic pressure is 60.1 mmHg. Left Atrium: Left atrial size was mildly dilated. Right Atrium: Right atrial size was normal in size. Pericardium: There is no evidence of pericardial effusion. Mitral Valve: The mitral valve is normal in structure. No evidence of mitral valve regurgitation. No evidence of mitral valve stenosis. MV peak gradient, 3.0 mmHg. The mean mitral valve gradient is 1.0 mmHg. Tricuspid Valve: The tricuspid valve is normal in structure. Tricuspid valve regurgitation is trivial. No evidence of tricuspid stenosis. Aortic Valve: The aortic valve has an indeterminant number of cusps. Aortic valve regurgitation is not visualized. No aortic stenosis is present. Aortic valve mean gradient measures 5.0 mmHg. Aortic valve peak gradient measures 9.0 mmHg. Aortic valve area, by VTI measures 4.39 cm. Pulmonic Valve: The pulmonic valve was not well visualized. Pulmonic valve regurgitation is not visualized. No evidence of pulmonic stenosis. Aorta: The aortic root is normal in size and structure. Venous: The inferior vena cava is normal in size with greater than 50% respiratory variability, suggesting right atrial pressure of 3 mmHg. IAS/Shunts: No atrial level shunt detected by color flow Doppler.  LEFT VENTRICLE PLAX 2D LVIDd:         4.50 cm     Diastology LVIDs:         2.60 cm     LV e' medial:    9.14 cm/s LV PW:         0.80 cm     LV E/e' medial:  8.5 LV IVS:        1.00 cm     LV e' lateral:   10.30 cm/s LVOT diam:     2.65 cm     LV E/e' lateral: 7.5 LV SV:         127 LV SV Index:   68 LVOT Area:     5.52 cm  LV Volumes (MOD) LV vol d, MOD A2C: 69.7 ml LV vol d, MOD A4C: 55.1 ml LV vol s, MOD A2C: 24.0 ml LV vol s, MOD A4C: 20.3 ml LV SV MOD A2C:     45.7 ml LV SV MOD A4C:     55.1 ml LV SV MOD BP:       39.5 ml RIGHT VENTRICLE RV Basal diam:  3.80 cm RV Mid diam:    2.70 cm RV S prime:     13.60 cm/s TAPSE (M-mode): 2.7 cm LEFT ATRIUM             Index        RIGHT ATRIUM           Index LA diam:        3.20 cm 1.71 cm/m   RA Area:     14.20 cm LA Vol (A2C):   65.4 ml 34.87 ml/m  RA Volume:   32.40 ml  17.27 ml/m LA Vol (A4C):   67.0 ml 35.72 ml/m LA Biplane Vol: 66.8 ml 35.61 ml/m  AORTIC VALVE                     PULMONIC VALVE AV Area (Vmax):    4.27 cm      PV Vmax:       0.82 m/s AV Area (Vmean):   4.01 cm      PV Peak grad:  2.7 mmHg AV Area (VTI):     4.39 cm AV Vmax:  150.00 cm/s AV Vmean:          101.000 cm/s AV VTI:            0.290 m AV Peak Grad:      9.0 mmHg AV Mean Grad:      5.0 mmHg LVOT Vmax:         116.00 cm/s LVOT Vmean:        73.400 cm/s LVOT VTI:          0.231 m LVOT/AV VTI ratio: 0.80  AORTA Ao Root diam: 2.90 cm MITRAL VALVE               TRICUSPID VALVE MV Area (PHT): 3.58 cm    TR Peak grad:   25.8 mmHg MV Area VTI:   5.86 cm    TR Vmax:        254.00 cm/s MV Peak grad:  3.0 mmHg MV Mean grad:  1.0 mmHg    SHUNTS MV Vmax:       0.86 m/s    Systemic VTI:  0.23 m MV Vmean:      49.3 cm/s   Systemic Diam: 2.65 cm MV Decel Time: 212 msec MV E velocity: 77.60 cm/s MV A velocity: 78.40 cm/s MV E/A ratio:  0.99 Carlyle Dolly MD Electronically signed by Carlyle Dolly MD Signature Date/Time: 09/11/2021/10:47:11 AM    Final    DG Chest Portable 1 View  Result Date: 09/10/2021 CLINICAL DATA:  Palpitations EXAM: PORTABLE CHEST 1 VIEW COMPARISON:  Chest radiograph 10/08/2020 FINDINGS: The cardiomediastinal silhouette is normal. There is no focal consolidation or pulmonary edema. There is no pleural effusion or pneumothorax There is no acute osseous abnormality. IMPRESSION: No radiographic evidence of acute cardiopulmonary process. Electronically Signed   By: Valetta Mole M.D.   On: 09/10/2021 16:41    Microbiology: Results for orders placed or performed in visit on  03/19/21  Novel Coronavirus, NAA ([facility])     Status: None   Collection Time: 03/19/21 10:36 AM   Specimen: Nasopharyngeal(NP) swabs in vial transport medium  Result Value Ref Range Status   SARS-CoV-2, NAA Not Detected Not Detected Final    Comment: This nucleic acid amplification test was developed and its performance characteristics determined by [employer]. Nucleic acid amplification tests include RT-PCR and TMA. This test has not been FDA cleared or approved. This test has been authorized by FDA under an Emergency Use Authorization (EUA). This test is only authorized for the duration of time the declaration that circumstances exist justifying the authorization of the emergency use of in vitro diagnostic tests for detection of SARS-CoV-2 virus and/or diagnosis of COVID-19 infection under section 564(b)(1) of the Act, 21 U.S.C. 161WRU-0(A) (1), unless the authorization is terminated or revoked sooner. When diagnostic testing is negative, the possibility of a false negative result should be considered in the context of a patient's recent exposures and the presence of clinical signs and symptoms consistent with COVID-19. An individual without symptoms of COVID-19 and who is not shedding SARS-CoV-2 virus wo uld expect to have a negative (not detected) result in this assay.     Labs: CBC: Recent Labs  Lab 09/10/21 1658 09/11/21 0745  WBC 6.9 6.2  NEUTROABS 3.7  --   HGB 14.2 13.9  HCT 41.5 42.4  MCV 91.8 94.6  PLT 169 540   Basic Metabolic Panel: Recent Labs  Lab 09/10/21 1658 09/11/21 0745  NA 142 142  K 3.7 4.0  CL 112* 109  CO2  26 26  GLUCOSE 107* 107*  BUN 19 16  CREATININE 0.93 0.83  CALCIUM 9.2 9.2  MG 2.2  --    Liver Function Tests: No results for input(s): "AST", "ALT", "ALKPHOS", "BILITOT", "PROT", "ALBUMIN" in the last 168 hours. CBG: No results for input(s): "GLUCAP" in the last 168 hours.  Discharge time spent: greater than 30  minutes.  Signed: Orson Eva, MD [facility] 09/11/2021

## 2021-09-11 NOTE — Progress Notes (Signed)
Pt is requesting her [name] used for her bipolar episodes.. stated she has not taken in two days. Will rely in shift change to on coming RN, not currently on [facility]

## 2021-09-11 NOTE — Progress Notes (Signed)
Since no appt availability in [place] for follow-up appt request, inpatient nurse made pt aware of [place] location prior to DC (outlined on AVS). Patient agreeable to [facility] location.

## 2021-09-11 NOTE — TOC Progression Note (Signed)
  Transition of Care [facility]) Screening Note   Patient Details  Name: Jennifer [name] Date of Birth: 07-15-1965   Transition of Care [facility]) CM/SW Contact:    Boneta Lucks, RN Phone Number: 09/11/2021, 2:25 PM    Transition of Care Department [facility]) has reviewed patient and no TOC needs have been identified at this time. We will continue to monitor patient advancement through interdisciplinary progression rounds. If new patient transition needs arise, please place a TOC consult.      Expected Discharge Plan: Home/Self Care Barriers to Discharge: No Barriers Identified  Expected Discharge Plan and Services Expected Discharge Plan: Home/Self Care       Living arrangements for the past 2 months: Single Family Home Expected Discharge Date: 09/11/21

## 2021-09-11 NOTE — Hospital Course (Signed)
56 year old female with a history of bipolar disorder, OCD, bulimia presenting with palpitations and near syncope.  The patient states that she has been in her usual state of health until around 11:15 AM on 09/10/2021 when she was driving to her therapy session to meet her family.  At that time, the patient felt a pounding sensation in her chest with some lightheadedness and diaphoresis.  She states that she continued to have the symptoms for the next several hours.  She went home to take a nap hoping that she will feel better.  She woke up with continued sensation of her heart racing with some shortness of breath and lightheadedness.  She went to urgent care where an EKG was done and was interpreted as SVT with heart rate 134.  The patient was transferred to the emergency department for further evaluation.  Follow-up EKG in the emergency department showed normal sinus rhythm.  The patient was admitted for further evaluation and treatment.  BMP showed sodium 142, potassium 3.7, bicarbonate 26, serum creatinine 0.93.  Magnesium 2.2.  TSH 1.335.  UDS was negative.  Chest x-ray was negative.  Cardiology was consulted to assist with management.

## 2021-09-11 NOTE — Assessment & Plan Note (Signed)
Symptomatic with dizziness, palpitations.   Initial EKG from urgent care showed SVT with rate of 134.  -no SVT episodes since admission Recent lipid panel 08/01/2021, LDL of 86, total cholesterol 160. -8/24 Echo--EF 65-70%, no WMA, normal RV -Cardiology consult appreciated>>ok to go home as echo reassuring ---follow office in 3 weeks ---start diltiazem 30 mg bid with 30 mg prn tachycardia - TSH 1.335

## 2021-09-11 NOTE — Progress Notes (Signed)
*  PRELIMINARY RESULTS* Echocardiogram 2D Echocardiogram has been performed.  Jennifer [name] 09/11/2021, 10:26 AM

## 2021-09-11 NOTE — Assessment & Plan Note (Signed)
Continue home medications. Continue outpatient follow-up with psychiatry

## 2021-09-12 ENCOUNTER — Telehealth: Payer: Self-pay

## 2021-09-12 NOTE — Telephone Encounter (Signed)
Transition Care Management Follow-up Telephone Call Date of discharge and from where: [facility] 09/11/21 How have you been since you were released from the hospital? Sore Any questions or concerns? NO  Items Reviewed: Did the pt receive and understand the discharge instructions provided? Yes  Medications obtained and verified? Yes  Other?  N/A Any new allergies since your discharge? No  Dietary orders reviewed? Yes Do you have support at home? Yes   Home Care and Equipment/Supplies: Were home health services ordered? no If so, what is the name of the agency? N/A  Has the agency set up a time to come to the patient's home? no Were any new equipment or medical supplies ordered?  No What is the name of the medical supply agency? N/A Were you able to get the supplies/equipment? not applicable Do you have any questions related to the use of the equipment or supplies? No  Functional Questionnaire: (I = Independent and D = Dependent) ADLs: i  Bathing/Dressing- i  Meal Prep- i  Eating- i  Maintaining continence- i  Transferring/Ambulation- i  Managing Meds- i  Follow up appointments reviewed:  PCP Hospital f/u appt confirmed? Yes  Scheduled to see Posey Pronto on 09/17/21 @ 9:40. [place] [facility] f/u appt confirmed? Yes  Scheduled to see Cardio on 09/30/21 @ 10:15. Are transportation arrangements needed? No  If their condition worsens, is the pt aware to call PCP or go to the Emergency Dept.? Yes Was the patient provided with contact information for the PCP's office or ED? Yes Was to pt encouraged to call back with questions or concerns? Yes

## 2021-09-15 ENCOUNTER — Telehealth (HOSPITAL_COMMUNITY): Payer: Self-pay | Admitting: Physician Assistant

## 2021-09-15 ENCOUNTER — Telehealth: Payer: Self-pay | Admitting: Internal Medicine

## 2021-09-15 DIAGNOSIS — I471 Supraventricular tachycardia: Secondary | ICD-10-CM

## 2021-09-15 MED ORDER — METOPROLOL TARTRATE 25 MG PO TABS: 12.5000 mg | ORAL_TABLET | Freq: Two times a day (BID) | ORAL | 3 refills | Status: DC

## 2021-09-15 NOTE — Telephone Encounter (Signed)
Called and made the patient aware of Dayna's recommendations to try metoprolol 12.5 mg BID, avoid caffeine, and EP referral. Patient verbalized understanding and thanked me for the call. Rx sent to preferred pharmacy and EP referral placed.

## 2021-09-15 NOTE — Telephone Encounter (Signed)
Triage - Can trial metoprolol tartrate 12.'5mg'$  BID instead of diltiazem. Considered starting at higher dose but with h/o med sensitivity, let her know we are starting very low dose so we can ensure she tolerates this. Important to limit caffeine including Mt Dew. Please place referral to EP as well so we have that pending. Has history of multiple med intolerances so ablation may need to be entertained.   Will cc to Dr. Harl Bowie so he is aware of med change.

## 2021-09-15 NOTE — Telephone Encounter (Signed)
Heart racing, seen at urgent care, dx w SVT.   Went to hospital from there.  ([facility]) Diltiazem 30 mg BID was prescribed.    She wants to not take any more.  Caused fatigue and her ankles hurt really bad and a little in her legs.  Did not take med last night or this am.  Symptoms improved including fatigue.   She also does not want to have any more SVT.  She is not scheduled in cardiology for a couple weeks.   Called PCP also. They are seeing her Wed and said they can discuss at that time.  Pt aware I will send to PharmD and to APP who saw pt at hospital for recommendations.  Adv patient the fatigue will probably improve as her body gets used to this.  Unsure if dilt will cause joint pain.  She may go ahead and retry this because she is nervous to have her heart racing again.

## 2021-09-15 NOTE — Telephone Encounter (Signed)
Patient states she will stop the medicine until appointment   And patient has stopped smoking .  If needs to call back can

## 2021-09-15 NOTE — Telephone Encounter (Signed)
Patient called in regard to diltiazem (CARDIZEM) 30 MG tablet   Prescribed in hospital.  Med is making patient really tired and causing leg pains, she wants to know if there is anything else she can take in place   Appt on 8/30

## 2021-09-15 NOTE — Telephone Encounter (Signed)
Please advise patient to keep appointment and will discuss with provider at that appointment

## 2021-09-15 NOTE — Telephone Encounter (Signed)
Pt c/o medication issue:  1. Name of Medication:   diltiazem (CARDIZEM) 30 MG tablet  2. How are you currently taking this medication (dosage and times per day)? As prescribed  3. Are you having a reaction (difficulty breathing--STAT)?  No  4. What is your medication issue?   Patient stated that this medication is making her very tired and is making her joint in her leg hurt when she goes up stairs.  Patient stated that she has stopped this medication last night and would like to get an alternate medication.

## 2021-09-17 ENCOUNTER — Ambulatory Visit: Payer: Medicare PPO | Admitting: Internal Medicine

## 2021-09-17 ENCOUNTER — Encounter: Payer: Self-pay | Admitting: Internal Medicine

## 2021-09-17 VITALS — BP 118/84 | HR 69 | Resp 18 | Ht 64.0 in | Wt 182.4 lb

## 2021-09-17 DIAGNOSIS — R778 Other specified abnormalities of plasma proteins: Secondary | ICD-10-CM | POA: Diagnosis not present

## 2021-09-17 DIAGNOSIS — I471 Supraventricular tachycardia: Secondary | ICD-10-CM | POA: Diagnosis not present

## 2021-09-17 DIAGNOSIS — Z23 Encounter for immunization: Secondary | ICD-10-CM | POA: Diagnosis not present

## 2021-09-17 DIAGNOSIS — F502 Bulimia nervosa: Secondary | ICD-10-CM

## 2021-09-17 DIAGNOSIS — Z09 Encounter for follow-up examination after completed treatment for conditions other than malignant neoplasm: Secondary | ICD-10-CM

## 2021-09-17 NOTE — Assessment & Plan Note (Addendum)
Needs to avoid pursing to avoid dehydration Followed by psychiatry On [name], Luvox, gabapentin and Remeron for bipolar disorder On Klonopin and Atarax as needed

## 2021-09-17 NOTE — Assessment & Plan Note (Signed)
Had symptomatic episode of SVT, now resolved Currently in sinus rhythm Well controlled with metoprolol Follow up with cardiology and EP cardiology

## 2021-09-17 NOTE — Patient Instructions (Addendum)
Please continue taking medications as prescribed.  Please continue to follow low salt diet and perform moderate exercise/walking at least 150 mins/week.  Please maintain at least 64 ounces of fluid intake a day.

## 2021-09-17 NOTE — Assessment & Plan Note (Signed)
Hospital chart reviewed, including discharge summary Medications reconciled and reviewed with the patient in detail 

## 2021-09-17 NOTE — Progress Notes (Signed)
Established Patient Office Visit  Subjective:  Patient ID: Jennifer [name], female    DOB: [birth year]  Age: 56 y.o. MRN: [id number]  CC:  Chief Complaint  Patient presents with   Transitions Of Care    [facility] patient discharged 09-11-21 for svt has been doing ok diatizem was making ankles hurt and fatigued cardiology switched to metoprolol and this has been working much better     HPI Jennifer [name] is a 56 y.o. female with past medical history of migraine, peripheral neuropathy, OCD and bipolar disorder who presents for f/u after recent hospitalization for episode of SVT.  She was admitted from 09/10/21-09/11/21. The patient felt a pounding sensation in her chest with some lightheadedness and diaphoresis.  She states that she continued to have the symptoms for the next several hours.  She went home to take a nap hoping that she will feel better.  She woke up with continued sensation of her heart racing with some shortness of breath and lightheadedness.  She went to urgent care where an EKG was done and was interpreted as SVT with heart rate 134.  The patient was transferred to the emergency department for further evaluation.  Follow-up EKG in the emergency department showed normal sinus rhythm.  The patient was admitted for further evaluation and treatment.  TSH 1.335.  UDS was negative.  Chest x-ray was negative.  Cardiology was consulted to assist with management.  Troponin was elevated, likely due to demand ischemia.  She was discharged on diltiazem, but she had fatigue and leg swelling with it.  She was later switched to metoprolol 12.5 mg twice daily, which she has been tolerating well without major side effects.  She denies any episode of chest pain, dyspnea or palpitations since being discharged.  She is going to see cardiology and EP cardiology for evaluation of SVT.  She reports 2 other episodes of palpitations with dizziness in the past, few months apart.  Of note, she has history of  bulimia, and reports purging the previous night before her admission.  Past Medical History:  Diagnosis Date   Abdominal pain, epigastric 10/07/2018   Acute non-recurrent maxillary sinusitis 11/07/2019   Anxiety    Arthritis    Binge-eating and purging type anorexia nervosa    Bipolar affective disorder ([place]) 12/04/2015   Bipolar affective disorder, current episode manic without psychotic symptoms ([place]) 12/04/2015   Bipolar disorder ([place])    Cellulitis and abscess of buttock 02/15/2013   Cystitis, interstitial    Depression    Depression    Phreesia 01/28/2020   Esophageal polyp    about 20 years ago   GERD (gastroesophageal reflux disease)    Left wrist pain 03/22/2019   OCD (obsessive compulsive disorder)    Palpitations 05/25/2014   Rectal pain 12/06/2018   Rib pain on right side 01/31/2019   Rosacea 03/27/2019   Sprain of temporomandibular joint or ligament 01/10/2019    Past Surgical History:  Procedure Laterality Date   BLADDER SURGERY     CHOLECYSTECTOMY     COLONOSCOPY WITH PROPOFOL N/A 10/05/2019   Procedure: COLONOSCOPY WITH PROPOFOL;  Surgeon: Daneil Dolin, MD;  [facility]: [facility];  Service: Endoscopy;  Laterality: N/A;  12:45pm   COSMETIC SURGERY N/A    Phreesia 01/28/2020   ESOPHAGOGASTRODUODENOSCOPY  09/28/2016   [facility]; Dr. Therisa Doyne; erosions in the esophagus, 5 cm hiatal hernia, nonbleeding erosive gastropathy s/p biopsied, normal duodenum.  Path with chronic inactive gastritis, no H. pylori  or intestinal metaplasia.    ESOPHAGOGASTRODUODENOSCOPY (EGD) WITH PROPOFOL N/A 10/17/2018   Dr. Gala Romney: mild reflux esophagitis, small hiatal hernia   POLYPECTOMY  10/05/2019   Procedure: POLYPECTOMY;  Surgeon: Daneil Dolin, MD;  Location: [facility];  Service: Endoscopy;;   VOCAL CORD LATERALIZATION, ENDOSCOPIC APPROACH W/ MLB      Family History  Problem Relation Age of Onset   Healthy Mother    Healthy Father    Colon cancer Neg Hx    Colon polyps  Neg Hx     Social History   Socioeconomic History   Marital status: Married    Spouse name: Todd   Number of children: 0   Years of education: Not on file   Highest education level: Not on file  Occupational History   Not on file  Tobacco Use   Smoking status: Every Day    Packs/day: 0.50    Years: 20.00    Total pack years: 10.00    Types: Cigarettes    Start date: 01/20/1988   Smokeless tobacco: Never  Vaping Use   Vaping Use: Never used  Substance and Sexual Activity   Alcohol use: No    Alcohol/week: 0.0 standard drinks of alcohol   Drug use: No   Sexual activity: Yes    Birth control/protection: None  Other Topics Concern   Not on file  Social History Narrative   Lives with husband -[name] of 36 years       Yorkie- Max      Enjoys: reading-all genres      Diet: eats all food groups   Caffeine: 1 cup daily at times, diet dr pepper daily   Water: gatorade  zero and water       Wears seat belt    Does not use phone while driving   Smoke and Development worker, 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         Social Determinants of Health   Financial Resource Strain: Low Risk  (10/11/2020)   Overall Financial Resource Strain (CARDIA)    Difficulty of Paying Living Expenses: Not hard at all  Food Insecurity: No Food Insecurity (10/11/2020)   Hunger Vital Sign    Worried About Running Out of Food in the Last Year: Never true    [place] in the Last Year: Never true  Transportation Needs: No Transportation Needs (10/11/2020)   PRAPARE - Hydrologist (Medical): No    Lack of Transportation (Non-Medical): No  Physical Activity: Insufficiently Active (10/11/2020)   Exercise Vital Sign    Days of Exercise per Week: 7 days    Minutes of Exercise per Session: 20 min  Stress: Stress Concern Present (10/11/2020)   [place]    Feeling of Stress : To some extent   Social Connections: Moderately Integrated (10/11/2020)   Social Connection and Isolation Panel [NHANES]    Frequency of Communication with Friends and Family: More than three times a week    Frequency of Social Gatherings with Friends and Family: Once a week    Attends Religious Services: More than 4 times per year    Active Member of [employer] or Organizations: No    Attends Archivist Meetings: Never    Marital Status: Married  Human resources officer Violence: Unknown (10/11/2020)   Humiliation, Afraid, Rape, and Kick questionnaire    Fear of Current  or Ex-Partner: No    Emotionally Abused: No    Physically Abused: No    Sexually Abused: Not on file    Outpatient Medications Prior to Visit  Medication Sig Dispense Refill   Brexpiprazole ([name]) 4 MG TABS Take 4 mg by mouth in the morning. 30 tablet 11   cholestyramine (QUESTRAN) 4 g packet Take 1 packet (4 g total) by mouth 2 (two) times daily. 60 each 12   clonazePAM (KLONOPIN) 0.5 MG tablet Take 1 tablet (0.5 mg total) by mouth 3 (three) times daily as needed for anxiety. 30 tablet 5   cromolyn (OPTICROM) 4 % ophthalmic solution Place 1 drop into both eyes 2 (two) times daily.     diclofenac Sodium (VOLTAREN) 1 % GEL Apply 4 g topically 4 (four) times daily. 50 g 0   dicyclomine (BENTYL) 20 MG tablet Take 1 tablet (20 mg total) by mouth 3 (three) times daily as needed for spasms (Diarrhea). 30 tablet 1   Famotidine-Ca Carb-Mag Hydrox (PEPCID COMPLETE PO) Take 1 tablet by mouth daily.     fluvoxaMINE (LUVOX) 100 MG tablet Take 3 tablets (300 mg total) by mouth at bedtime. 270 tablet 3   gabapentin (NEURONTIN) 600 MG tablet 2 p.o. every morning, 1 p.o. q. afternoon, 2 p.o. nightly. 150 tablet 5   hydrOXYzine (ATARAX) 25 MG tablet Take 1-2 tablets (25-50 mg total) by mouth every 8 (eight) hours as needed. 90 tablet 11   Lactobacillus-Inulin (CULTURELLE DIGESTIVE DAILY PO) Take by mouth.     loperamide (IMODIUM) 2 MG capsule Take 2 mg  by mouth as needed for diarrhea or loose stools.     loratadine (CLARITIN) 10 MG tablet Take 5 mg by mouth daily as needed for allergies.      metoprolol tartrate (LOPRESSOR) 25 MG tablet Take 0.5 tablets (12.5 mg total) by mouth 2 (two) times daily. 90 tablet 3   mirtazapine (REMERON) 15 MG tablet Take 1 tablet (15 mg total) by mouth at bedtime. 90 tablet 3   naproxen (NAPROSYN) 500 MG tablet Take 500 mg by mouth 2 (two) times daily as needed for migraine.     ondansetron (ZOFRAN-ODT) 4 MG disintegrating tablet Take 1 tablet (4 mg total) by mouth every 8 (eight) hours as needed for nausea or vomiting. 30 tablet 1   pramipexole (MIRAPEX) 0.5 MG tablet Take 1 tablet (0.5 mg total) by mouth at bedtime. 90 tablet 3   prednisoLONE acetate (PRED FORTE) 1 % ophthalmic suspension Place 1 drop into both eyes in the morning and at bedtime.     Prenatal Vit-Fe Fumarate-FA (PRENATAL MULTIVITAMIN) TABS tablet Take 1 tablet by mouth daily at 12 noon.     RESTASIS 0.05 % ophthalmic emulsion 1 drop 2 (two) times daily.     Rimegepant Sulfate (NURTEC) 75 MG TBDP Take 75 mg by mouth daily as needed (take for abortive therapy of migraine, no more than 1 tablet in 24 hours or 10 per month). 8 tablet 11   triamcinolone cream (KENALOG) 0.1 % Apply 1 application. topically 2 (two) times daily. 30 g 0   valACYclovir (VALTREX) 500 MG tablet Take 500 mg by mouth daily as needed (Flair up take Twice a day  for 3 days and stop).      Wheat Dextrin (BENEFIBER DRINK MIX PO) Take by mouth.     No facility-administered medications prior to visit.    Allergies  Allergen Reactions   Codeine Shortness Of Breath, Nausea Only and Rash  Penicillins Hives, Shortness Of Breath, Swelling and Rash    Has patient had a PCN reaction causing immediate rash, facial/tongue/throat swelling, SOB or lightheadedness with hypotension: yes Has patient had a PCN reaction causing severe rash involving mucus membranes or skin necrosis: no Has  patient had a PCN reaction that required hospitalization: no Has patient had a PCN reaction occurring within the last 10 years: no If all of the above answers are "NO", then may proceed with Cephalosporin use.;      Divalproex Sodium Hives, Itching and Rash   Meloxicam Other (See Comments)    Possible chest tightness - instructed by MD not to take   Sonata [Zaleplon] Other (See Comments)    Hallucinations   Topamax [Topiramate] Other (See Comments)    Low BP and dizziness   Aciphex [Rabeprazole] Swelling   Aimovig [Erenumab-Aooe] Itching   Galcanezumab-Gnlm Rash and Hives    ROS Review of Systems  Constitutional:  Negative for chills and fever.  HENT:  Negative for congestion, sinus pressure and sinus pain.   Respiratory:  Negative for cough and shortness of breath.   Cardiovascular:  Negative for chest pain and palpitations.  Gastrointestinal:  Positive for diarrhea and nausea. Negative for constipation and vomiting.  Genitourinary:  Negative for dysuria and hematuria.  Musculoskeletal:  Positive for arthralgias (Left hip). Negative for neck pain and neck stiffness.  Skin:  Positive for rash.       B/l heel creases  Neurological:  Negative for dizziness and weakness.  Psychiatric/Behavioral:  Negative for agitation and behavioral problems.       Objective:    Physical Exam Constitutional:      General: She is not in acute distress.    Appearance: She is not diaphoretic.  Eyes:     General: No scleral icterus.    Extraocular Movements: Extraocular movements intact.  Cardiovascular:     Rate and Rhythm: Normal rate and regular rhythm.     Pulses: Normal pulses.     Heart sounds: Normal heart sounds. No murmur heard. Pulmonary:     Breath sounds: Normal breath sounds. No wheezing or rales.  Abdominal:     Palpations: Abdomen is soft.     Tenderness: There is abdominal tenderness (Mild, b/l flank area).  Skin:    General: Skin is warm.     Findings: Rash  (Erythematous patches over back area) present.  Neurological:     General: No focal deficit present.     Mental Status: She is alert and oriented to person, place, and time. Mental status is at baseline.  Psychiatric:        Mood and Affect: Mood normal.        Behavior: Behavior normal.     BP 118/84 (BP Location: Right Arm, Patient Position: Sitting, Cuff Size: Normal)   Pulse 69   Resp 18   Ht 5' 4"  (1.626 m)   Wt 182 lb 6.4 oz (82.7 kg)   LMP 10/07/2011   SpO2 97%   BMI 31.31 kg/m  Wt Readings from Last 3 Encounters:  09/17/21 182 lb 6.4 oz (82.7 kg)  09/10/21 180 lb (81.6 kg)  08/08/21 182 lb 12.8 oz (82.9 kg)    Lab Results  Component Value Date   TSH 1.335 09/10/2021   Lab Results  Component Value Date   WBC 6.2 09/11/2021   HGB 13.9 09/11/2021   HCT 42.4 09/11/2021   MCV 94.6 09/11/2021   PLT 160 09/11/2021   Lab Results  Component Value Date   NA 142 09/11/2021   K 4.0 09/11/2021   CO2 26 09/11/2021   GLUCOSE 107 (H) 09/11/2021   BUN 16 09/11/2021   CREATININE 0.83 09/11/2021   BILITOT 0.5 08/01/2021   ALKPHOS 104 08/01/2021   AST 33 08/01/2021   ALT 23 08/01/2021   PROT 7.4 08/01/2021   ALBUMIN 4.7 08/01/2021   CALCIUM 9.2 09/11/2021   ANIONGAP 7 09/11/2021   EGFR 75 08/01/2021   Lab Results  Component Value Date   CHOL 160 08/01/2021   Lab Results  Component Value Date   HDL 54 08/01/2021   Lab Results  Component Value Date   LDLCALC 86 08/01/2021   Lab Results  Component Value Date   TRIG 110 08/01/2021   Lab Results  Component Value Date   CHOLHDL 3.0 08/01/2021   Lab Results  Component Value Date   HGBA1C 5.6 08/01/2021      Assessment & Plan:   Problem List Items Addressed This Visit       Cardiovascular and Mediastinum   SVT (supraventricular tachycardia) ([place]) - Primary    Had symptomatic episode of SVT, now resolved Currently in sinus rhythm Well controlled with metoprolol Follow up with cardiology and EP  cardiology        Other   Bulimia    Needs to avoid pursing to avoid dehydration Followed by psychiatry On [name], Luvox, gabapentin and Remeron for bipolar disorder On Klonopin and Atarax as needed      Elevated troponin    Likely due to demand ischemia in the setting of SVT No chest pain      Hospital discharge follow-up    Hospital chart reviewed, including discharge summary Medications reconciled and reviewed with the patient in detail      Other Visit Diagnoses     Need for immunization against influenza       Relevant Orders   Flu Vaccine QUAD 70moIM (Fluarix, Fluzone & Alfiuria Quad PF) (Completed)       No orders of the defined types were placed in this encounter.   Follow-up: Return if symptoms worsen or fail to improve.    RLindell Spar MD

## 2021-09-17 NOTE — Assessment & Plan Note (Signed)
Likely due to demand ischemia in the setting of SVT No chest pain

## 2021-09-23 ENCOUNTER — Ambulatory Visit: Payer: Medicare PPO | Admitting: Physician Assistant

## 2021-09-23 NOTE — Telephone Encounter (Signed)
I agree with PA Laural Golden MD

## 2021-09-24 DIAGNOSIS — F4310 Post-traumatic stress disorder, unspecified: Secondary | ICD-10-CM | POA: Diagnosis not present

## 2021-09-24 DIAGNOSIS — F502 Bulimia nervosa: Secondary | ICD-10-CM | POA: Diagnosis not present

## 2021-09-24 DIAGNOSIS — F429 Obsessive-compulsive disorder, unspecified: Secondary | ICD-10-CM | POA: Diagnosis not present

## 2021-09-24 DIAGNOSIS — F319 Bipolar disorder, unspecified: Secondary | ICD-10-CM | POA: Diagnosis not present

## 2021-09-24 NOTE — Progress Notes (Signed)
Cardiology Office Note:    Date:  09/30/2021   ID:  Jennifer [name], DOB [birth year], MRN [id number]  PCP:  Lindell Spar, MD  [place] Providers Cardiologist:  None     Referring MD: Lindell Spar, MD   Chief Complaint:  Follow-up     History of Present Illness:   Jennifer [name] is a 56 y.o. female   with a hx of tobacco abuse, bipolar disorder, anxiety, depression, eating disorder, OCD. Was seen in 2016 for palpitations with normal echo and monitor. She saw Dr. Gwenlyn Found in 2/023 for lower extremity discomfort he felt was unrelated to PAD. No family hx of CAD (paternal 67 had PPM).   Patient discharged 09/11/21 with SVT on diltiazem. Called in not wanting to take dilt so switched to metoprolol and referred to EPS.  Patient comes in for f/u. Last week her heart started racing and she took an extra metoprolol and it slowed it down. She didn't checked her pulse. She admits to being bulimic and getting up at night to eat a sandwich or ice cream and then purges.     Past Medical History:  Diagnosis Date   Abdominal pain, epigastric 10/07/2018   Acute non-recurrent maxillary sinusitis 11/07/2019   Anxiety    Arthritis    Binge-eating and purging type anorexia nervosa    Bipolar affective disorder ([place]) 12/04/2015   Bipolar affective disorder, current episode manic without psychotic symptoms ([place]) 12/04/2015   Bipolar disorder ([place])    Cellulitis and abscess of buttock 02/15/2013   Cystitis, interstitial    Depression    Depression    Phreesia 01/28/2020   Esophageal polyp    about 20 years ago   GERD (gastroesophageal reflux disease)    Left wrist pain 03/22/2019   OCD (obsessive compulsive disorder)    Palpitations 05/25/2014   Rectal pain 12/06/2018   Rib pain on right side 01/31/2019   Rosacea 03/27/2019   Sprain of temporomandibular joint or ligament 01/10/2019   Current Medications: Current Meds  Medication Sig   Brexpiprazole ([name]) 4 MG TABS Take 4  mg by mouth in the morning.   cholestyramine (QUESTRAN) 4 g packet Take 1 packet (4 g total) by mouth 2 (two) times daily.   clonazePAM (KLONOPIN) 0.5 MG tablet Take 1 tablet (0.5 mg total) by mouth 3 (three) times daily as needed for anxiety.   cromolyn (OPTICROM) 4 % ophthalmic solution Place 1 drop into both eyes 2 (two) times daily.   diclofenac Sodium (VOLTAREN) 1 % GEL Apply 4 g topically 4 (four) times daily.   dicyclomine (BENTYL) 20 MG tablet Take 1 tablet (20 mg total) by mouth 3 (three) times daily as needed for spasms (Diarrhea).   Famotidine-Ca Carb-Mag Hydrox (PEPCID COMPLETE PO) Take 1 tablet by mouth daily.   fluvoxaMINE (LUVOX) 100 MG tablet Take 3 tablets (300 mg total) by mouth at bedtime.   gabapentin (NEURONTIN) 600 MG tablet 2 p.o. every morning, 1 p.o. q. afternoon, 2 p.o. nightly.   hydrOXYzine (ATARAX) 25 MG tablet Take 1-2 tablets (25-50 mg total) by mouth every 8 (eight) hours as needed.   Lactobacillus-Inulin (CULTURELLE DIGESTIVE DAILY PO) Take by mouth.   loperamide (IMODIUM) 2 MG capsule Take 2 mg by mouth as needed for diarrhea or loose stools.   loratadine (CLARITIN) 10 MG tablet Take 5 mg by mouth daily as needed for allergies.    metoprolol tartrate (LOPRESSOR) 25 MG tablet Take 0.5 tablets (12.5 mg total)  by mouth 2 (two) times daily.   mirtazapine (REMERON) 15 MG tablet Take 1 tablet (15 mg total) by mouth at bedtime.   naproxen (NAPROSYN) 500 MG tablet Take 500 mg by mouth 2 (two) times daily as needed for migraine.   ondansetron (ZOFRAN-ODT) 4 MG disintegrating tablet Take 1 tablet (4 mg total) by mouth every 8 (eight) hours as needed for nausea or vomiting.   pramipexole (MIRAPEX) 0.5 MG tablet Take 1 tablet (0.5 mg total) by mouth at bedtime.   prednisoLONE acetate (PRED FORTE) 1 % ophthalmic suspension Place 1 drop into both eyes in the morning and at bedtime.   Prenatal Vit-Fe Fumarate-FA (PRENATAL MULTIVITAMIN) TABS tablet Take 1 tablet by mouth daily at  12 noon.   RESTASIS 0.05 % ophthalmic emulsion 1 drop 2 (two) times daily.   Rimegepant Sulfate (NURTEC) 75 MG TBDP Take 75 mg by mouth daily as needed (take for abortive therapy of migraine, no more than 1 tablet in 24 hours or 10 per month).   triamcinolone cream (KENALOG) 0.1 % Apply 1 application. topically 2 (two) times daily.   valACYclovir (VALTREX) 500 MG tablet Take 500 mg by mouth daily as needed (Flair up take Twice a day  for 3 days and stop).    Wheat Dextrin (BENEFIBER DRINK MIX PO) Take by mouth.    Allergies:   Codeine, Penicillins, Divalproex sodium, Meloxicam, Sonata [zaleplon], Topamax [topiramate], Aciphex [rabeprazole], Aimovig [erenumab-aooe], and Galcanezumab-gnlm   Social History   Tobacco Use   Smoking status: Every Day    Packs/day: 0.50    Years: 20.00    Total pack years: 10.00    Types: Cigarettes    Start date: 01/20/1988   Smokeless tobacco: Never  Vaping Use   Vaping Use: Never used  Substance Use Topics   Alcohol use: No    Alcohol/week: 0.0 standard drinks of alcohol   Drug use: No    Family Hx: The patient's family history includes Healthy in her father and mother. There is no history of Colon cancer or Colon polyps.  ROS   EKGs/Labs/Other Test Reviewed:    EKG:  EKG is  not ordered today.     Recent Labs: 08/01/2021: ALT 23 09/10/2021: Magnesium 2.2; TSH 1.335 09/11/2021: BUN 16; Creatinine, Ser 0.83; Hemoglobin 13.9; Platelets 160; Potassium 4.0; Sodium 142   Recent Lipid Panel Recent Labs    08/01/21 0816  CHOL 160  TRIG 110  HDL 54  LDLCALC 86     Prior CV Studies: ECHO COMPLETE WO IMAGING ENHANCING AGENT 09/11/2021  Narrative ECHOCARDIOGRAM REPORT    Patient Name:   Jennifer [name] Date of Exam: 09/11/2021 Medical Rec #:  [id number]      Height:       64.3 in Accession #:    [id number]     Weight:       180.0 lb Date of Birth:  08-Feb-1965      BSA:          1.876 m Patient Age:    56 years       BP:           125/82  mmHg Patient Gender: F              HR:           68 bpm. Exam Location:  [facility]  Procedure: 2D Echo, Cardiac Doppler and Color Doppler  Indications:    Tachycardia  History:  Patient has prior history of Echocardiogram examinations, most recent 07/09/2014. Arrythmias:Tachycardia; Risk Factors:Current Smoker.  Sonographer:    Wenda Low Referring Phys: [place]   1. Left ventricular ejection fraction, by estimation, is 65 to 70%. The left ventricle has normal function. The left ventricle has no regional wall motion abnormalities. Left ventricular diastolic parameters were normal. 2. Right ventricular systolic function is normal. The right ventricular size is normal. There is normal pulmonary artery systolic pressure. 3. Left atrial size was mildly dilated. 4. The mitral valve is normal in structure. No evidence of mitral valve regurgitation. No evidence of mitral stenosis. 5. The aortic valve has an indeterminant number of cusps. Aortic valve regurgitation is not visualized. No aortic stenosis is present. 6. The inferior vena cava is normal in size with greater than 50% respiratory variability, suggesting right atrial pressure of 3 mmHg.  FINDINGS Left Ventricle: Left ventricular ejection fraction, by estimation, is 65 to 70%. The left ventricle has normal function. The left ventricle has no regional wall motion abnormalities. The left ventricular internal cavity size was normal in size. There is no left ventricular hypertrophy. Left ventricular diastolic parameters were normal.  Right Ventricle: The right ventricular size is normal. Right vetricular wall thickness was not well visualized. Right ventricular systolic function is normal. There is normal pulmonary artery systolic pressure. The tricuspid regurgitant velocity is 2.54 m/s, and with an assumed right atrial pressure of 3 mmHg, the estimated right ventricular systolic pressure is 63.7  mmHg.  Left Atrium: Left atrial size was mildly dilated.  Right Atrium: Right atrial size was normal in size.  Pericardium: There is no evidence of pericardial effusion.  Mitral Valve: The mitral valve is normal in structure. No evidence of mitral valve regurgitation. No evidence of mitral valve stenosis. MV peak gradient, 3.0 mmHg. The mean mitral valve gradient is 1.0 mmHg.  Tricuspid Valve: The tricuspid valve is normal in structure. Tricuspid valve regurgitation is trivial. No evidence of tricuspid stenosis.  Aortic Valve: The aortic valve has an indeterminant number of cusps. Aortic valve regurgitation is not visualized. No aortic stenosis is present. Aortic valve mean gradient measures 5.0 mmHg. Aortic valve peak gradient measures 9.0 mmHg. Aortic valve area, by VTI measures 4.39 cm.  Pulmonic Valve: The pulmonic valve was not well visualized. Pulmonic valve regurgitation is not visualized. No evidence of pulmonic stenosis.  Aorta: The aortic root is normal in size and structure.  Venous: The inferior vena cava is normal in size with greater than 50% respiratory variability, suggesting right atrial pressure of 3 mmHg.  IAS/Shunts: No atrial level shunt detected by color flow Doppler.   LEFT VENTRICLE PLAX 2D LVIDd:         4.50 cm     Diastology LVIDs:         2.60 cm     LV e' medial:    9.14 cm/s LV PW:         0.80 cm     LV E/e' medial:  8.5 LV IVS:        1.00 cm     LV e' lateral:   10.30 cm/s LVOT diam:     2.65 cm     LV E/e' lateral: 7.5 LV SV:         127 LV SV Index:   68 LVOT Area:     5.52 cm  LV Volumes (MOD) LV vol d, MOD A2C: 69.7 ml LV vol d, MOD A4C: 55.1  ml LV vol s, MOD A2C: 24.0 ml LV vol s, MOD A4C: 20.3 ml LV SV MOD A2C:     45.7 ml LV SV MOD A4C:     55.1 ml LV SV MOD BP:      39.5 ml  RIGHT VENTRICLE RV Basal diam:  3.80 cm RV Mid diam:    2.70 cm RV S prime:     13.60 cm/s TAPSE (M-mode): 2.7 cm  LEFT ATRIUM             Index         RIGHT ATRIUM           Index LA diam:        3.20 cm 1.71 cm/m   RA Area:     14.20 cm LA Vol (A2C):   65.4 ml 34.87 ml/m  RA Volume:   32.40 ml  17.27 ml/m LA Vol (A4C):   67.0 ml 35.72 ml/m LA Biplane Vol: 66.8 ml 35.61 ml/m AORTIC VALVE                     PULMONIC VALVE AV Area (Vmax):    4.27 cm      PV Vmax:       0.82 m/s AV Area (Vmean):   4.01 cm      PV Peak grad:  2.7 mmHg AV Area (VTI):     4.39 cm AV Vmax:           150.00 cm/s AV Vmean:          101.000 cm/s AV VTI:            0.290 m AV Peak Grad:      9.0 mmHg AV Mean Grad:      5.0 mmHg LVOT Vmax:         116.00 cm/s LVOT Vmean:        73.400 cm/s LVOT VTI:          0.231 m LVOT/AV VTI ratio: 0.80  AORTA Ao Root diam: 2.90 cm  MITRAL VALVE               TRICUSPID VALVE MV Area (PHT): 3.58 cm    TR Peak grad:   25.8 mmHg MV Area VTI:   5.86 cm    TR Vmax:        254.00 cm/s MV Peak grad:  3.0 mmHg MV Mean grad:  1.0 mmHg    SHUNTS MV Vmax:       0.86 m/s    Systemic VTI:  0.23 m MV Vmean:      49.3 cm/s   Systemic Diam: 2.65 cm MV Decel Time: 212 msec MV E velocity: 77.60 cm/s MV A velocity: 78.40 cm/s MV E/A ratio:  0.99  Carlyle Dolly MD Electronically signed by Carlyle Dolly MD Signature Date/Time: 09/11/2021/10:47:11 AM    Final         Risk Assessment/Calculations/Metrics:              Physical Exam:    VS:  BP 128/82   Pulse (!) 52   Ht '5\' 4"'$  (1.626 m)   Wt 181 lb (82.1 kg)   LMP 10/07/2011   SpO2 98%   BMI 31.07 kg/m     Wt Readings from Last 3 Encounters:  09/30/21 181 lb (82.1 kg)  09/17/21 182 lb 6.4 oz (82.7 kg)  09/10/21 180 lb (81.6 kg)    Physical Exam  GEN: Obese, in no  acute distress  Neck: no JVD, carotid bruits, or masses Cardiac:RRR; no murmurs, rubs, or gallops  Respiratory:  clear to auscultation bilaterally, normal work of breathing GI: soft, nontender, nondistended, + BS Ext: without cyanosis, clubbing, or edema, Good distal pulses  bilaterally Neuro:  Alert and Oriented x 3,  Psych: euthymic mood, full affect       ASSESSMENT & PLAN:   No problem-specific Assessment & Plan notes found for this encounter.   Palpitations/SVT - history of tachycardia/palpitations - from prior notes managed for palpitations/inappropriate sinus tach, was on dilt in the past however doesn't feel well on it so was started on metoprolol and referred to EPS - no plans for ischemic testing at this time.   -Echo shows normal LV function, no WMAs.  -wants to take a supplement for menopause-will bring it with her to next appt to see if appropriate.          Dispo:  No follow-ups on file.   Medication Adjustments/Labs and Tests Ordered: Current medicines are reviewed at length with the patient today.  Concerns regarding medicines are outlined above.  Tests Ordered: No orders of the defined types were placed in this encounter.  Medication Changes: No orders of the defined types were placed in this encounter.  Sumner Boast, PA-C  09/30/2021 10:11 AM    [address], [place], [place]  [address] Phone: [phone]; Fax: [phone]

## 2021-09-29 DIAGNOSIS — F319 Bipolar disorder, unspecified: Secondary | ICD-10-CM | POA: Diagnosis not present

## 2021-09-29 DIAGNOSIS — F4310 Post-traumatic stress disorder, unspecified: Secondary | ICD-10-CM | POA: Diagnosis not present

## 2021-09-29 DIAGNOSIS — F429 Obsessive-compulsive disorder, unspecified: Secondary | ICD-10-CM | POA: Diagnosis not present

## 2021-09-29 DIAGNOSIS — F502 Bulimia nervosa: Secondary | ICD-10-CM | POA: Diagnosis not present

## 2021-09-30 ENCOUNTER — Ambulatory Visit: Payer: Medicare PPO | Attending: Physician Assistant | Admitting: Physician Assistant

## 2021-09-30 ENCOUNTER — Encounter: Payer: Self-pay | Admitting: Physician Assistant

## 2021-09-30 VITALS — BP 128/82 | HR 52 | Ht 64.0 in | Wt 181.0 lb

## 2021-09-30 DIAGNOSIS — I471 Supraventricular tachycardia: Secondary | ICD-10-CM | POA: Diagnosis not present

## 2021-09-30 NOTE — Progress Notes (Unsigned)
Referring Provider:  Primary Care Physician:  Lindell Spar, MD  Primary GI: Dr. Gala Romney  Patient Location: Home   Provider Location: [facility] office   Reason for Visit: ***   Persons present on the virtual encounter, with roles: ***patient - Jennifer [name] and [name], NP   Total time (minutes) spent on medical discussion: *** minutes  Virtual Visit Encounter Note Visit is conducted virtually and was requested by patient.   I connected with [name] on 09/30/21 at  8:00 AM EDT by video*** and verified that I am speaking with the correct person using two identifiers.   I discussed the limitations, risks, security and privacy concerns of performing an evaluation and management service by video*** and the availability of in person appointments. I also discussed with the patient that there may be a patient responsible charge related to this service. The patient expressed understanding and agreed to proceed.  No chief complaint on file.    History of Present Illness: Jennifer [name] is a 56 y.o. female with a history of anxiety, GERD, bulimia, fatty liver, depression, bipolar, and arthritis who presents for virtual visit today with complaint of ***  Last colonoscopy September 2021: two 2-4 mm polyps in the sigmoid colon, exam otherwise normal.  Advise repeat in 10 years.  Last seen in the clinic 05/01/2021 with complaint of diarrhea that have been going on for approximately 6 weeks.  She was reportedly having 3-4 stools per day varying from moderate to small amounts of stool and noted some incomplete defecation with occasional urgency.  She was not having nocturnal stools and denied any melena or hematochezia.  She reported relief of abdominal pain with defecation, bloating.  She reported her pain was in the periumbilical region and lower abdomen described as dull and pressure-like.  Reported diarrhea was worse with eating and also states it was made worse by taking propanolol.  She  had tried over-the-counter Imodium with good result.  She had had recent negative C. difficile and Giardia and prior GI pathogen positive for norovirus.  PCP in July 2023 and reported chronic loose bowel movements, sometimes watery.  Had noted some improvement with Bentyl, Benefiber, probiotic supplement.  Denies any dysuria or hematuria but did note some mild flank pain.  Reported chronic nausea due to bulimia.  Appears she was started on cholestyramine 4 g twice daily.  Last completed in July 2023 was unremarkable CBC, lipid panel, A1c, TSH, and vitamin D, chemistry with mildly elevated sodium and glucose, normal LFTs and renal function..  GI profile completed 08/08/2021 completely negative.  Recent labs 09/10/2021 during urgent care/ED visit for tachycardia, palpitations, and elevated troponin With normal CBC, magnesium, TSH.  Mildly elevated chloride and glucose, normal renal function.  Had follow-up with cardiology yesterday 09/30/2021 for follow-up on her ED visit.  Recent echo showed normal LV function, palpitations/inappropriate sinus tach managed with metoprolol.  Reportedly she was previously on Dilt but did not like the way it made her feel.   Today:    Medications No outpatient medications have been marked as taking for the 10/01/21 encounter (Appointment) with Sherron Monday, NP.     History Past Medical History:  Diagnosis Date   Abdominal pain, epigastric 10/07/2018   Acute non-recurrent maxillary sinusitis 11/07/2019   Anxiety    Arthritis    Binge-eating and purging type anorexia nervosa    Bipolar affective disorder ([place]) 12/04/2015   Bipolar affective disorder, current episode manic without psychotic symptoms ([place])  12/04/2015   Bipolar disorder ([place])    Cellulitis and abscess of buttock 02/15/2013   Cystitis, interstitial    Depression    Depression    Phreesia 01/28/2020   Esophageal polyp    about 20 years ago   GERD (gastroesophageal reflux disease)    Left  wrist pain 03/22/2019   OCD (obsessive compulsive disorder)    Palpitations 05/25/2014   Rectal pain 12/06/2018   Rib pain on right side 01/31/2019   Rosacea 03/27/2019   Sprain of temporomandibular joint or ligament 01/10/2019    Past Surgical History:  Procedure Laterality Date   BLADDER SURGERY     CHOLECYSTECTOMY     COLONOSCOPY WITH PROPOFOL N/A 10/05/2019   Procedure: COLONOSCOPY WITH PROPOFOL;  Surgeon: Daneil Dolin, MD;  Location: [facility];  Service: Endoscopy;  Laterality: N/A;  12:45pm   COSMETIC SURGERY N/A    Phreesia 01/28/2020   ESOPHAGOGASTRODUODENOSCOPY  09/28/2016   [facility]; Dr. Therisa Doyne; erosions in the esophagus, 5 cm hiatal hernia, nonbleeding erosive gastropathy s/p biopsied, normal duodenum.  Path with chronic inactive gastritis, no H. pylori or intestinal metaplasia.    ESOPHAGOGASTRODUODENOSCOPY (EGD) WITH PROPOFOL N/A 10/17/2018   Dr. Gala Romney: mild reflux esophagitis, small hiatal hernia   POLYPECTOMY  10/05/2019   Procedure: POLYPECTOMY;  Surgeon: Daneil Dolin, MD;  Location: [facility];  Service: Endoscopy;;   VOCAL CORD LATERALIZATION, ENDOSCOPIC APPROACH W/ MLB      Family History  Problem Relation Age of Onset   Healthy Mother    Healthy Father    Colon cancer Neg Hx    Colon polyps Neg Hx     Social History   Socioeconomic History   Marital status: Married    Spouse name: Todd   Number of children: 0   Years of education: Not on file   Highest education level: Not on file  Occupational History   Not on file  Tobacco Use   Smoking status: Every Day    Packs/day: 0.50    Years: 20.00    Total pack years: 10.00    Types: Cigarettes    Start date: 01/20/1988   Smokeless tobacco: Never  Vaping Use   Vaping Use: Never used  Substance and Sexual Activity   Alcohol use: No    Alcohol/week: 0.0 standard drinks of alcohol   Drug use: No   Sexual activity: Yes    Birth control/protection: None  Other Topics Concern   Not on file   Social History Narrative   Lives with husband -[name] of 36 years       [place]- [place]      Enjoys: reading-all genres      Diet: eats all food groups   Caffeine: 1 cup daily at times, diet dr pepper daily   Water: gatorade  zero and water       Wears seat belt    Does not use phone while driving   Smoke and Development worker, 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         Social Determinants of Health   Financial Resource Strain: Low Risk  (10/11/2020)   Overall Financial Resource Strain (CARDIA)    Difficulty of Paying Living Expenses: Not hard at all  Food Insecurity: No Food Insecurity (10/11/2020)   Hunger Vital Sign    Worried About Running Out of Food in the Last Year: Never true    [place] in the  Last Year: Never true  Transportation Needs: No Transportation Needs (10/11/2020)   PRAPARE - Hydrologist (Medical): No    Lack of Transportation (Non-Medical): No  Physical Activity: Insufficiently Active (10/11/2020)   Exercise Vital Sign    Days of Exercise per Week: 7 days    Minutes of Exercise per Session: 20 min  Stress: Stress Concern Present (10/11/2020)   [place]    Feeling of Stress : To some extent  Social Connections: Moderately Integrated (10/11/2020)   Social Connection and Isolation Panel [NHANES]    Frequency of Communication with Friends and Family: More than three times a week    Frequency of Social Gatherings with Friends and Family: Once a week    Attends Religious Services: More than 4 times per year    Active Member of [employer] or Organizations: No    Attends Archivist Meetings: Never    Marital Status: Married      Review of Systems: Gen: Denies fever, chills, anorexia. Denies fatigue, weakness, weight loss.  CV: Denies chest pain, palpitations, syncope, peripheral edema, and claudication. Resp: Denies dyspnea at rest, cough,  wheezing, coughing up blood, and pleurisy. GI: see HPI Derm: Denies rash, itching, dry skin Psych: Denies depression, anxiety, memory loss, confusion. No homicidal or suicidal ideation.  Heme: Denies bruising, bleeding, and enlarged lymph nodes.  Observations/Objective: No distress. Alert and oriented. Pleasant. Well nourished. Normal mood and affect. Unable to perform complete physical exam due to video*** encounter. No video available.    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  Reportedly has been on Levsin in the past.  Previously noted some improvement with Bentyl prescribed at last visit.  Also taking fiber supplement.  In April she had positive stool profile for norovirus but with repeat in July no evidence of infectious diarrhea.  Plan:      Follow Up Instructions:  I discussed the assessment and treatment plan with the patient. The patient was provided an opportunity to ask questions and all were answered. The patient agreed with the plan and demonstrated an understanding of the instructions.   The patient was advised to call back or seek an in-person evaluation if the symptoms worsen or if the condition fails to improve as anticipated.    [name], MSN, FNP-BC, AGACNP-BC [facility]

## 2021-09-30 NOTE — Patient Instructions (Signed)
Medication Instructions:  Your physician recommends that you continue on your current medications as directed. Please refer to the Current Medication list given to you today.  *If you need a refill on your cardiac medications before your next appointment, please call your pharmacy*   Lab Work: None If you have labs (blood work) drawn today and your tests are completely normal, you will receive your results only by: [facility] (if you have MyChart) OR A paper copy in the mail If you have any lab test that is abnormal or we need to change your treatment, we will call you to review the results.  Follow-Up: At [facility], you and your health needs are our priority.  As part of our continuing mission to provide you with exceptional heart care, we have created designated Provider Care Teams.  These Care Teams include your primary Cardiologist (physician) and Advanced Practice Providers (APPs -  Physician Assistants and Nurse Practitioners) who all work together to provide you with the care you need, when you need it.  Your next appointment:   Keep 10/20/2021 at 1:45 PM  The format for your next appointment:   In Person  Provider:   Dr Myles Gip  Important Information About Sugar

## 2021-10-01 ENCOUNTER — Encounter: Payer: Self-pay | Admitting: Gastroenterology

## 2021-10-01 ENCOUNTER — Telehealth (INDEPENDENT_AMBULATORY_CARE_PROVIDER_SITE_OTHER): Payer: Medicare PPO | Admitting: Gastroenterology

## 2021-10-01 VITALS — Ht 64.0 in | Wt 180.0 lb

## 2021-10-01 DIAGNOSIS — K529 Noninfective gastroenteritis and colitis, unspecified: Secondary | ICD-10-CM

## 2021-10-01 MED ORDER — COLESTIPOL HCL 1 G PO TABS: ORAL_TABLET | ORAL | 1 refills | Status: DC

## 2021-10-01 NOTE — Patient Instructions (Addendum)
I want you to stop taking the cholestyramine packets.  I am sending in colestipol (Colestid) tablets.  These are 1 g tablets and you will need to take 4 of them starting twice a day.  I want you to try this for a few weeks to see if you have improvement in your stool frequency.  If after a few weeks you are still having frequent bowel movements (more than 4-5 a day) you may increase to taking it 3 times a day and I want you to try this for couple weeks.  If after you have tried this and are still having more than 4-5 stools per day I want you to call me with an update.  Then I want you to start scheduling 2 mg of Imodium prior to each meal (not exceeding more than 16 mg/day).  You may continue to take the Levsin as needed for any abdominal pain.  Given that you do not have a gallbladder anymore, I suspect that your diarrhea is primarily related to excess bile given that it primarily happens after meals.  There could be some dietary factors that are also causing diarrhea. Foods that can cause diarrhea after gallbladder removal: Greasy/high fat foods, processed meats, caffeine, sweets or dairy products from the diet.  May be beneficial for you to increase fiber in your diet as well.  You may take Benefiber or Metamucil 2 to 3 teaspoons daily.  Also eating more frequent smaller meals rather than larger meals may also help.  I want you to start keeping a food and symptom log, documenting foods that you eat, how long after you use the bathroom, and any other associated symptoms.  This may help you pinpoint certain types of foods that are causing more diarrhea.  I have attached handouts regarding chronic diarrhea and good food choices for this with gallbladder issues (including removal of gallbladder).   We will plan for virtual visit in 2 to 3 months to follow-up.  It was a pleasure to see you today. I want to create trusting relationships with patients. If you receive a survey regarding your visit,  I  greatly appreciate you taking time to fill this out on paper or through your MyChart. I value your feedback.  [name], MSN, FNP-BC, AGACNP-BC [facility]

## 2021-10-02 DIAGNOSIS — Z01419 Encounter for gynecological examination (general) (routine) without abnormal findings: Secondary | ICD-10-CM | POA: Diagnosis not present

## 2021-10-02 DIAGNOSIS — Z1231 Encounter for screening mammogram for malignant neoplasm of breast: Secondary | ICD-10-CM | POA: Diagnosis not present

## 2021-10-02 DIAGNOSIS — Z124 Encounter for screening for malignant neoplasm of cervix: Secondary | ICD-10-CM | POA: Diagnosis not present

## 2021-10-02 LAB — HM PAP SMEAR: HM Pap smear: NORMAL

## 2021-10-08 DIAGNOSIS — F429 Obsessive-compulsive disorder, unspecified: Secondary | ICD-10-CM | POA: Diagnosis not present

## 2021-10-08 DIAGNOSIS — F502 Bulimia nervosa: Secondary | ICD-10-CM | POA: Diagnosis not present

## 2021-10-08 DIAGNOSIS — F4310 Post-traumatic stress disorder, unspecified: Secondary | ICD-10-CM | POA: Diagnosis not present

## 2021-10-08 DIAGNOSIS — F319 Bipolar disorder, unspecified: Secondary | ICD-10-CM | POA: Diagnosis not present

## 2021-10-16 ENCOUNTER — Ambulatory Visit (INDEPENDENT_AMBULATORY_CARE_PROVIDER_SITE_OTHER): Payer: Medicare PPO | Admitting: Nurse Practitioner

## 2021-10-16 ENCOUNTER — Encounter: Payer: Self-pay | Admitting: Nurse Practitioner

## 2021-10-16 DIAGNOSIS — Z122 Encounter for screening for malignant neoplasm of respiratory organs: Secondary | ICD-10-CM

## 2021-10-16 DIAGNOSIS — F4310 Post-traumatic stress disorder, unspecified: Secondary | ICD-10-CM | POA: Diagnosis not present

## 2021-10-16 DIAGNOSIS — Z0000 Encounter for general adult medical examination without abnormal findings: Secondary | ICD-10-CM | POA: Diagnosis not present

## 2021-10-16 DIAGNOSIS — F319 Bipolar disorder, unspecified: Secondary | ICD-10-CM | POA: Diagnosis not present

## 2021-10-16 DIAGNOSIS — F502 Bulimia nervosa: Secondary | ICD-10-CM | POA: Diagnosis not present

## 2021-10-16 DIAGNOSIS — F429 Obsessive-compulsive disorder, unspecified: Secondary | ICD-10-CM | POA: Diagnosis not present

## 2021-10-16 NOTE — Patient Instructions (Addendum)
  Ms. Dobis , Thank you for taking time to come for your Medicare Wellness Visit. I appreciate your ongoing commitment to your health goals. Please review the following plan we discussed and let me know if I can assist you in the future.   These are the goals we discussed:  Please pick up the advanced directives forms at the office during your next appointment   Goals      Increase physical activity     I will engage in regular moderate exercises at least  30 minutes 5 days a week         This is a list of the screening recommended for you and due dates:  Health Maintenance  Topic Date Due   Tetanus Vaccine  Never done   Pap Smear  06/10/2020   Mammogram  09/28/2022   Colon Cancer Screening  10/04/2029   Flu Shot  Completed   COVID-19 Vaccine  Completed   Hepatitis C Screening: USPSTF Recommendation to screen - Ages 18-79 yo.  Completed   HIV Screening  Completed   Zoster (Shingles) Vaccine  Completed   HPV Vaccine  Aged Out

## 2021-10-16 NOTE — Progress Notes (Signed)
I connected with  Jennifer [name] on 10/16/21 by a audio enabled telemedicine application and verified that I am speaking with the correct person using two identifiers.  Patient Location: Home  Provider Location: Home Office  I discussed the limitations of evaluation and management by telemedicine. The patient expressed understanding and agreed to proceed.   Subjective:   Jennifer [name] is a 56 y.o. female who presents for Medicare Annual (Subsequent) preventive examination.  Review of Systems           Objective:    There were no vitals filed for this visit. There is no height or weight on file to calculate BMI.     09/10/2021    4:10 PM 06/05/2021    1:35 PM 11/25/2020    5:34 AM 10/11/2020   12:02 PM 10/05/2019    9:32 AM 04/06/2019   12:36 PM 10/31/2018    6:03 PM  Advanced Directives  Does Patient Have a Medical Advance Directive? No No No No No No No  Would patient like information on creating a medical advance directive?  No - Patient declined   Yes (MAU/Ambulatory/Procedural Areas - Information given)  No - Patient declined    Current Medications (verified) Outpatient Encounter Medications as of 10/16/2021  Medication Sig   Brexpiprazole ([name]) 4 MG TABS Take 4 mg by mouth in the morning.   clonazePAM (KLONOPIN) 0.5 MG tablet Take 1 tablet (0.5 mg total) by mouth 3 (three) times daily as needed for anxiety.   colestipol (COLESTID) 1 g tablet Take 4 tablets orally two to three times daily for diarrhea.   cromolyn (OPTICROM) 4 % ophthalmic solution Place 1 drop into both eyes 2 (two) times daily.   diclofenac Sodium (VOLTAREN) 1 % GEL Apply 4 g topically 4 (four) times daily.   dicyclomine (BENTYL) 20 MG tablet Take 1 tablet (20 mg total) by mouth 3 (three) times daily as needed for spasms (Diarrhea).   Famotidine-Ca Carb-Mag Hydrox (PEPCID COMPLETE PO) Take 1 tablet by mouth daily.   fluvoxaMINE (LUVOX) 100 MG tablet Take 3 tablets (300 mg total) by mouth at bedtime.    gabapentin (NEURONTIN) 600 MG tablet 2 p.o. every morning, 1 p.o. q. afternoon, 2 p.o. nightly.   hydrOXYzine (ATARAX) 25 MG tablet Take 1-2 tablets (25-50 mg total) by mouth every 8 (eight) hours as needed.   Lactobacillus-Inulin (CULTURELLE DIGESTIVE DAILY PO) Take by mouth.   loperamide (IMODIUM) 2 MG capsule Take 2 mg by mouth as needed for diarrhea or loose stools.   loratadine (CLARITIN) 10 MG tablet Take 5 mg by mouth daily as needed for allergies.    metoprolol tartrate (LOPRESSOR) 25 MG tablet Take 0.5 tablets (12.5 mg total) by mouth 2 (two) times daily.   mirtazapine (REMERON) 15 MG tablet Take 1 tablet (15 mg total) by mouth at bedtime.   naproxen (NAPROSYN) 500 MG tablet Take 500 mg by mouth 2 (two) times daily as needed for migraine.   pramipexole (MIRAPEX) 0.5 MG tablet Take 1 tablet (0.5 mg total) by mouth at bedtime.   prednisoLONE acetate (PRED FORTE) 1 % ophthalmic suspension Place 1 drop into both eyes in the morning and at bedtime.   Prenatal Vit-Fe Fumarate-FA (PRENATAL MULTIVITAMIN) TABS tablet Take 1 tablet by mouth daily at 12 noon.   RESTASIS 0.05 % ophthalmic emulsion 1 drop 2 (two) times daily.   Rimegepant Sulfate (NURTEC) 75 MG TBDP Take 75 mg by mouth daily as needed (take for abortive therapy of  migraine, no more than 1 tablet in 24 hours or 10 per month).   triamcinolone cream (KENALOG) 0.1 % Apply 1 application. topically 2 (two) times daily.   valACYclovir (VALTREX) 500 MG tablet Take 500 mg by mouth daily as needed (Flair up take Twice a day  for 3 days and stop).    Wheat Dextrin (BENEFIBER DRINK MIX PO) Take by mouth.   No facility-administered encounter medications on file as of 10/16/2021.    Allergies (verified) Codeine, Penicillins, Divalproex sodium, Meloxicam, Sonata [zaleplon], Topamax [topiramate], Aciphex [rabeprazole], Aimovig [erenumab-aooe], and Galcanezumab-gnlm   History: Past Medical History:  Diagnosis Date   Abdominal pain, epigastric  10/07/2018   Acute non-recurrent maxillary sinusitis 11/07/2019   Anxiety    Arthritis    Binge-eating and purging type anorexia nervosa    Bipolar affective disorder ([place]) 12/04/2015   Bipolar affective disorder, current episode manic without psychotic symptoms ([place]) 12/04/2015   Bipolar disorder ([place])    Cellulitis and abscess of buttock 02/15/2013   Cystitis, interstitial    Depression    Depression    Phreesia 01/28/2020   Esophageal polyp    about 20 years ago   GERD (gastroesophageal reflux disease)    Left wrist pain 03/22/2019   OCD (obsessive compulsive disorder)    Palpitations 05/25/2014   Rectal pain 12/06/2018   Rib pain on right side 01/31/2019   Rosacea 03/27/2019   Sprain of temporomandibular joint or ligament 01/10/2019   Past Surgical History:  Procedure Laterality Date   BLADDER SURGERY     CHOLECYSTECTOMY     COLONOSCOPY WITH PROPOFOL N/A 10/05/2019   Procedure: COLONOSCOPY WITH PROPOFOL;  Surgeon: Daneil Dolin, MD;  [facility];  Service: Endoscopy;  Laterality: N/A;  12:45pm   COSMETIC SURGERY N/A    Phreesia 01/28/2020   ESOPHAGOGASTRODUODENOSCOPY  09/28/2016   [facility]; Dr. Therisa Doyne; erosions in the esophagus, 5 cm hiatal hernia, nonbleeding erosive gastropathy s/p biopsied, normal duodenum.  Path with chronic inactive gastritis, no H. pylori or intestinal metaplasia.    ESOPHAGOGASTRODUODENOSCOPY (EGD) WITH PROPOFOL N/A 10/17/2018   Dr. Gala Romney: mild reflux esophagitis, small hiatal hernia   POLYPECTOMY  10/05/2019   Procedure: POLYPECTOMY;  Surgeon: Daneil Dolin, MD;  Location: [facility];  Service: Endoscopy;;   VOCAL CORD LATERALIZATION, ENDOSCOPIC APPROACH W/ MLB     Family History  Problem Relation Age of Onset   Healthy Mother    Healthy Father    Colon cancer Neg Hx    Colon polyps Neg Hx    Social History   Socioeconomic History   Marital status: Married    Spouse name: Todd   Number of children: 0   Years of  education: Not on file   Highest education level: Not on file  Occupational History   Not on file  Tobacco Use   Smoking status: Every Day    Packs/day: 0.50    Years: 20.00    Total pack years: 10.00    Types: Cigarettes    Start date: 01/20/1988   Smokeless tobacco: Never  Vaping Use   Vaping Use: Never used  Substance and Sexual Activity   Alcohol use: No    Alcohol/week: 0.0 standard drinks of alcohol   Drug use: No   Sexual activity: Yes    Birth control/protection: None  Other Topics Concern   Not on file  Social History Narrative   Lives with husband -[name] of 15 years  [place]- [place]      Enjoys: reading-all genres      Diet: eats all food groups   Caffeine: 1 cup daily at times, diet dr pepper daily   Water: gatorade  zero and water       Wears seat belt    Does not use phone while driving   Smoke and Development worker, 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         Social Determinants of Health   Financial Resource Strain: Low Risk  (10/11/2020)   Overall Financial Resource Strain (CARDIA)    Difficulty of Paying Living Expenses: Not hard at all  Food Insecurity: No Food Insecurity (10/11/2020)   Hunger Vital Sign    Worried About Running Out of Food in the Last Year: Never true    Ran Out of Food in the Last Year: Never true  Transportation Needs: No Transportation Needs (10/11/2020)   PRAPARE - Hydrologist (Medical): No    Lack of Transportation (Non-Medical): No  Physical Activity: Insufficiently Active (10/11/2020)   Exercise Vital Sign    Days of Exercise per Week: 7 days    Minutes of Exercise per Session: 20 min  Stress: Stress Concern Present (10/11/2020)   [place]    Feeling of Stress : To some extent  Social Connections: Moderately Integrated (10/11/2020)   Social Connection and Isolation Panel [NHANES]    Frequency of Communication  with Friends and Family: More than three times a week    Frequency of Social Gatherings with Friends and Family: Once a week    Attends Religious Services: More than 4 times per year    Active Member of [employer] or Organizations: No    Attends Archivist Meetings: Never    Marital Status: Married    Tobacco Counseling Ready to quit: Not Answered Counseling given: Not Answered   Clinical Intake:              How often do you need to have someone help you when you read instructions, pamphlets, or other written materials from your doctor or pharmacy?: (P) 2 - Rarely Diabetic?no         Activities of Daily Living    10/13/2021    8:08 PM  In your present state of health, do you have any difficulty performing the following activities:  Hearing? 0  Vision? 0  Difficulty concentrating or making decisions? 1  Walking or climbing stairs? 0  Dressing or bathing? 0  Doing errands, shopping? 0  Preparing Food and eating ? N  Using the Toilet? N  In the past six months, have you accidently leaked urine? Y  Do you have problems with loss of bowel control? Y  Managing your Medications? Y  Managing your Finances? N  Housekeeping or managing your Housekeeping? N    Patient Care Team: Lindell Spar, MD as PCP - General (Internal Medicine) Gala Romney [name], MD as Consulting Physician (Gastroenterology)  Indicate any recent Medical Services you may have received from other than [facility] providers in the past year (date may be approximate).     Assessment:   This is a routine wellness examination for Jennifer [name].  Hearing/Vision screen No results found.  Dietary issues and exercise activities discussed:     Goals Addressed   None    Depression Screen    09/17/2021    9:22 AM  08/14/2021    8:17 AM 08/08/2021    9:50 AM 07/31/2021    3:17 PM 06/25/2021    3:14 PM 04/21/2021    9:09 AM 03/19/2021    8:06 AM  PHQ 2/9 Scores  PHQ - 2 Score 1 0 0 0 3 2 0  PHQ- 9 Score 1   0  18 8 0    Fall Risk    10/13/2021    8:08 PM 09/17/2021    9:22 AM 08/14/2021    8:17 AM 08/08/2021    9:50 AM 07/31/2021    3:17 PM  Fall Risk   Falls in the past year? 1 1 0 0 0  Number falls in past yr: 0 0 0 0 0  Injury with Fall? 1 1 0 0 0  Risk for fall due to :  History of fall(s);Impaired balance/gait No Fall Risks No Fall Risks No Fall Risks  Follow up  Falls evaluation completed;Education provided;Falls prevention discussed Falls evaluation completed Falls evaluation completed Falls evaluation completed    FALL RISK PREVENTION PERTAINING TO THE HOME:  Any stairs in or around the home? Yes  If so, are there any without handrails? No  Home free of loose throw rugs in walkways, pet beds, electrical cords, etc? Yes  Adequate lighting in your home to reduce risk of falls? Yes   ASSISTIVE DEVICES UTILIZED TO PREVENT FALLS:  Life alert? No  Use of a cane, walker or w/c? No  Grab bars in the bathroom? Yes  Shower chair or bench in shower? No  Elevated toilet seat or a handicapped toilet? No   TIMED UP AND GO:          10/11/2020   12:03 PM  6CIT Screen  What Year? 0 points  What month? 0 points  What time? 0 points  Count back from 20 0 points  Months in reverse 0 points  Repeat phrase 0 points  Total Score 0 points    Immunizations Immunization History  Administered Date(s) Administered   Influenza,inj,Quad PF,6+ Mos 11/26/2019, 09/17/2021   Influenza,inj,quad, With Preservative 10/19/2018   Influenza-Unspecified 08/25/2013, 11/11/2020   MODERNA COVID-19 SARS-COV-2 PEDS BIVALENT BOOSTER 6Y-11Y 01/27/2021   Moderna Sars-Covid-2 Vaccination 04/01/2019, 05/03/2019, 11/26/2019, 07/19/2020   Zoster Recombinat (Shingrix) 11/23/2018, 06/15/2019    TDAP status: Due, Education has been provided regarding the importance of this vaccine. Advised may receive this vaccine at local pharmacy or Health Dept. Aware to provide a copy of the vaccination record if obtained from  local pharmacy or Health Dept. Verbalized acceptance and understanding.  Flu Vaccine status: Up to date    Covid-19 vaccine status: Completed vaccines  Qualifies for Shingles Vaccine? Yes   Zostavax completed  Shingrix Completed?: Yes  Screening Tests Health Maintenance  Topic Date Due   TETANUS/TDAP  Never done   PAP SMEAR-Modifier  06/10/2020   MAMMOGRAM  09/28/2022   COLONOSCOPY (Pts 45-29yr Insurance coverage will need to be confirmed)  10/04/2029   INFLUENZA VACCINE  Completed   COVID-19 Vaccine  Completed   Hepatitis C Screening  Completed   HIV Screening  Completed   Zoster Vaccines- Shingrix  Completed   HPV VACCINES  Aged Out    Health Maintenance  Health Maintenance Due  Topic Date Due   TETANUS/TDAP  Never done   PAP SMEAR-Modifier  06/10/2020  Up to date with colonoscopy , next due in 2031 Up to date with mammogram    Lung Cancer Screening: (Low Dose CT Chest  recommended if Age 69-80 years, 60 pack-year currently smoking OR have quit w/in 15years.) does qualify.   Lung Cancer Screening Referral: ordered  Additional Screening:  Hepatitis C Screening: does qualify; Completed   Vision Screening: Recommended annual ophthalmology exams for early detection of glaucoma and other disorders of the eye. Is the patient up to date with their annual eye exam?  Yes  Who is the provider or what is the name of the office in which the patient attends annual eye exams? [facility] in [place]  If pt is not established with a provider, would they like to be referred to a provider to establish care? No .   Dental Screening: Recommended annual dental exams for proper oral hygiene  Community Resource Referral / Chronic Care Management: CRR required this visit?  No   CCM required this visit?  No      Plan:     I have personally reviewed and noted the following in the patient's chart:   Medical and social history Use of alcohol, tobacco or illicit drugs  Current  medications and supplements including opioid prescriptions. Patient is not currently taking opioid prescriptions. Functional ability and status Nutritional status Physical activity Advanced directives List of other physicians Hospitalizations, surgeries, and ER visits in previous 12 months Vitals Screenings to include cognitive, depression, and falls Referrals and appointments  In addition, I have reviewed and discussed with patient certain preventive protocols, quality metrics, and best practice recommendations. A written personalized care plan for preventive services as well as general preventive health recommendations were provided to patient.     Renee Rival, FNP   10/16/2021   Nurse Notes:

## 2021-10-17 NOTE — Addendum Note (Signed)
Addended by: Eual Fines on: 10/17/2021 01:32 PM   Modules accepted: Orders, Level of Service

## 2021-10-20 ENCOUNTER — Encounter: Payer: Self-pay | Admitting: Cardiovascular Disease

## 2021-10-20 ENCOUNTER — Telehealth: Payer: Self-pay | Admitting: *Deleted

## 2021-10-20 ENCOUNTER — Ambulatory Visit: Payer: Medicare PPO | Attending: Cardiovascular Disease | Admitting: Cardiovascular Disease

## 2021-10-20 VITALS — BP 118/78 | HR 67 | Ht 64.0 in | Wt 180.0 lb

## 2021-10-20 DIAGNOSIS — I471 Supraventricular tachycardia, unspecified: Secondary | ICD-10-CM | POA: Diagnosis not present

## 2021-10-20 LAB — BASIC METABOLIC PANEL
BUN/Creatinine Ratio: 16 (ref 9–23)
BUN: 15 mg/dL (ref 6–24)
CO2: 30 mmol/L — ABNORMAL HIGH (ref 20–29)
Calcium: 9.9 mg/dL (ref 8.7–10.2)
Chloride: 104 mmol/L (ref 96–106)
Creatinine, Ser: 0.92 mg/dL (ref 0.57–1.00)
Glucose: 92 mg/dL (ref 70–99)
Potassium: 4.5 mmol/L (ref 3.5–5.2)
Sodium: 140 mmol/L (ref 134–144)
eGFR: 73 mL/min/{1.73_m2} (ref >59)

## 2021-10-20 LAB — CBC
Hematocrit: 41.4 % (ref 34.0–46.6)
Hemoglobin: 14.2 g/dL (ref 11.1–15.9)
MCH: 31.1 pg (ref 26.6–33.0)
MCHC: 34.3 g/dL (ref 31.5–35.7)
MCV: 91 fL (ref 79–97)
Platelets: 198 10*3/uL (ref 150–450)
RBC: 4.56 x10E6/uL (ref 3.77–5.28)
RDW: 13.4 % (ref 11.7–15.4)
WBC: 8.8 10*3/uL (ref 3.4–10.8)

## 2021-10-20 NOTE — Progress Notes (Signed)
Cardiology Office Note:    Date:  10/20/2021   ID:  [name], DOB [birth year], MRN [id number]  PCP:  Lindell Spar, MD   Jennifer [name] Cardiologist:  None     Referring MD: Charlie Pitter, PA-C   Chief complaint: fast heart rhythm  History of Present Illness:    Jennifer [name] is a 56 y.o. female with a hx of SVT, PVD, Bipolar disorder, anxiety, depression, bulemia.  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  A colleague checked her pulse and noted to be over 200 beats a minute.  The arrhythmia had resolved prior to reaching the ER.  She has continued to have episodes lasting a few minutes a few times a year.  In August, she had sudden onset of palpitations. She went to an urgent care where an ECG was obtained showing a short R-P tachycardia at 134 bpm (see scan 09/11/2021) She was transferred to the [facility] ER but upon arrival she had converted to sinus rhythm. She was given diltiazem. She didn't tolerate this well and was switched to metoprolol.  She denies cheat pain, shortness of breath, ankle edema.  Arrhythmia History: First symptoms of tachycardia: about 7 years ago SVT diagnosed 09/11/2021 by ECG   Past Medical History:  Diagnosis Date   Abdominal pain, epigastric 10/07/2018   Acute non-recurrent maxillary sinusitis 11/07/2019   Anxiety    Arthritis    Binge-eating and purging type anorexia nervosa    Bipolar affective disorder ([place]) 12/04/2015   Bipolar affective disorder, current episode manic without psychotic symptoms ([place]) 12/04/2015   Bipolar disorder ([place])    Cellulitis and abscess of buttock 02/15/2013   Cystitis, interstitial    Depression    Depression    Phreesia 01/28/2020   Esophageal polyp    about 20 years ago   GERD (gastroesophageal reflux disease)    Left wrist pain 03/22/2019   OCD (obsessive compulsive disorder)    Palpitations 05/25/2014   Rectal pain 12/06/2018   Rib pain on  right side 01/31/2019   Rosacea 03/27/2019   Sprain of temporomandibular joint or ligament 01/10/2019    Past Surgical History:  Procedure Laterality Date   BLADDER SURGERY     CHOLECYSTECTOMY     COLONOSCOPY WITH PROPOFOL N/A 10/05/2019   Procedure: COLONOSCOPY WITH PROPOFOL;  Surgeon: Daneil Dolin, MD;  [facility];  Service: Endoscopy;  Laterality: N/A;  12:45pm   COSMETIC SURGERY N/A    Phreesia 01/28/2020   ESOPHAGOGASTRODUODENOSCOPY  09/28/2016   [facility]; Dr. Therisa Doyne; erosions in the esophagus, 5 cm hiatal hernia, nonbleeding erosive gastropathy s/p biopsied, normal duodenum.  Path with chronic inactive gastritis, no H. pylori or intestinal metaplasia.    ESOPHAGOGASTRODUODENOSCOPY (EGD) WITH PROPOFOL N/A 10/17/2018   Dr. Gala Romney: mild reflux esophagitis, small hiatal hernia   POLYPECTOMY  10/05/2019   Procedure: POLYPECTOMY;  Surgeon: Daneil Dolin, MD;  [facility];  Service: Endoscopy;;   VOCAL CORD LATERALIZATION, ENDOSCOPIC APPROACH W/ MLB      Current Medications: No outpatient medications have been marked as taking for the 10/20/21 encounter (Appointment) with Brendaliz Kuk, [name], MD.     Allergies:   Codeine, Penicillins, Divalproex sodium, Meloxicam, Sonata [zaleplon], Topamax [topiramate], Aciphex [rabeprazole], Aimovig [erenumab-aooe], and Galcanezumab-gnlm   Social History   Socioeconomic History   Marital status: Married    Spouse name: [name]   Number of children:  0   Years of education: Not on file   Highest education level: Not on file  Occupational History   Not on file  Tobacco Use   Smoking status: Former    Packs/day: 0.50    Years: 20.00    Total pack years: 10.00    Types: Cigarettes    Start date: 01/20/1988    Quit date: 09/10/2021    Years since quitting: 0.1   Smokeless tobacco: Never  Vaping Use   Vaping Use: Never used  Substance and Sexual Activity   Alcohol use: No    Alcohol/week: 0.0 standard drinks of alcohol    Drug use: No   Sexual activity: Yes    Birth control/protection: None  Other Topics Concern   Not on file  Social History Narrative   Lives with husband -[name] of 55 years       Yorkie- Max      Enjoys: reading-all genres      Diet: eats all food groups   Caffeine: 1 cup daily at times, diet dr pepper daily   Water: gatorade  zero and water       Wears seat belt    Does not use phone while driving   Smoke and Development worker, 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         Social Determinants of Health   Financial Resource Strain: Low Risk  (10/16/2021)   Overall Financial Resource Strain (CARDIA)    Difficulty of Paying Living Expenses: Not hard at all  Food Insecurity: No Food Insecurity (10/16/2021)   Hunger Vital Sign    Worried About Running Out of Food in the Last Year: Never true    Ran Out of Food in the Last Year: Never true  Transportation Needs: No Transportation Needs (10/16/2021)   PRAPARE - Hydrologist (Medical): No    Lack of Transportation (Non-Medical): No  Physical Activity: Inactive (10/16/2021)   Exercise Vital Sign    Days of Exercise per Week: 0 days    Minutes of Exercise per Session: 0 min  Stress: Stress Concern Present (10/16/2021)   [place]    Feeling of Stress : Very much  Social Connections: Socially Integrated (10/16/2021)   Social Connection and Isolation Panel [NHANES]    Frequency of Communication with Friends and Family: More than three times a week    Frequency of Social Gatherings with Friends and Family: Once a week    Attends Religious Services: More than 4 times per year    Active Member of [employer] or Organizations: Yes    Attends Music therapist: More than 4 times per year    Marital Status: Married     Family History: The patient's family history includes Healthy in her father and mother. There is no history  of Colon cancer or Colon polyps.  ROS:   Please see the history of present illness.     All other systems reviewed and are negative.  EKGs/Labs/Other Studies Reviewed:    The following studies were reviewed today:  TTE 09/11/2021  1. Left ventricular ejection fraction, by estimation, is 65 to 70%. The left ventricle has normal function. The left ventricle has no regional wall motion abnormalities. Left ventricular diastolic parameters were normal.   2. Right ventricular systolic function is normal. The right ventricular size is normal. There is normal pulmonary artery systolic pressure.  3. Left atrial size was mildly dilated.   4. The mitral valve is normal in structure. No evidence of mitral valve regurgitation. No evidence of mitral stenosis.   5. The aortic valve has an indeterminant number of cusps. Aortic valve regurgitation is not visualized. No aortic stenosis is present.   6. The inferior vena cava is normal in size with greater than 50%  respiratory variability, suggesting right atrial pressure of 3 mmHg.   Event Monitor 04/29/2014: Normal rhythm and rate distribution  EKG:  EKG is ordered today.      Orders placed or performed during the hospital encounter of 09/10/21   EKG 12-Lead   EKG 12-Lead   EKG     Recent Labs: 08/01/2021: ALT 23 09/10/2021: Magnesium 2.2; TSH 1.335 09/11/2021: BUN 16; Creatinine, Ser 0.83; Hemoglobin 13.9; Platelets 160; Potassium 4.0; Sodium 142     Physical Exam:    VS:  LMP 10/07/2011     Wt Readings from Last 3 Encounters:  10/01/21 180 lb (81.6 kg)  09/30/21 181 lb (82.1 kg)  09/17/21 182 lb 6.4 oz (82.7 kg)     GEN:  Well nourished, well developed in no acute distress CARDIAC: RRR, no murmurs, rubs, gallops RESPIRATORY:  Normal work of breathing MUSCULOSKELETAL: no edema    ASSESSMENT & PLAN:    In order of problems listed above:  SVT: Appears to be a short R-P tachycardia, rate about 130 on the available ECG, but she  reports rates approaching 200 in the past. I discussed management options with her including a conservative approach as she is now on, but she would be likely to have recurrence. I also offered her an EP study and potential SVT ablation. We discussed the indication, rationale, logistics, anticipated benefits, and potential risks of the ablation procedure including but not limited to -- bleed at the groin access site, chest pain, damage to nearby structure, need for a drainage tube, pacemaker, or prolonged hospitalization. I explained that the risk for stroke, heart attack, need for open chest surgery, or even death is very low but not zero. He expressed understanding and wishes to proceed.       Medication Adjustments/Labs and Tests Ordered: Current medicines are reviewed at length with the patient today.  Concerns regarding medicines are outlined above.  No orders of the defined types were placed in this encounter.  No orders of the defined types were placed in this encounter.    Signed, Melida Quitter, MD  10/20/2021 12:28 PM    [place]

## 2021-10-20 NOTE — Telephone Encounter (Signed)
Patient is having a heart ablation surgery on the 13 th ,ingrown is scheduled on 11th, will it be ok or should she  reschedule? Please advise.

## 2021-10-20 NOTE — Patient Instructions (Addendum)
Medication Instructions:  Your physician recommends that you continue on your current medications as directed. Please refer to the Current Medication list given to you today.  *If you need a refill on your cardiac medications before your next appointment, please call your pharmacy*   Lab Work: TODAY: CBC and BMET If you have labs (blood work) drawn today and your tests are completely normal, you will receive your results only by: [place] (if you have MyChart) OR A paper copy in the mail If you have any lab test that is abnormal or we need to change your treatment, we will call you to review the results.   Testing/Procedures: Your physician has recommended that you have an ablation. Catheter ablation is a medical procedure used to treat some cardiac arrhythmias (irregular heartbeats). During catheter ablation, a long, thin, flexible tube is put into a blood vessel in your groin (upper thigh), or neck. This tube is called an ablation catheter. It is then guided to your heart through the blood vessel. Radio frequency waves destroy small areas of heart tissue where abnormal heartbeats may cause an arrhythmia to start. Please see the instruction sheet given to you today.  Follow-Up: At [facility], you and your health needs are our priority.  As part of our continuing mission to provide you with exceptional heart care, we have created designated Provider Care Teams.  These Care Teams include your primary Cardiologist (physician) and Advanced Practice Providers (APPs -  Physician Assistants and Nurse Practitioners) who all work together to provide you with the care you need, when you need it.   Your next appointment:   See Instruction Letter

## 2021-10-21 ENCOUNTER — Telehealth: Payer: Self-pay | Admitting: Cardiovascular Disease

## 2021-10-21 NOTE — Telephone Encounter (Signed)
Pt would ike a callback regarding Ablation. She has questions about medications that she is and isn't able to take. Please advise

## 2021-10-21 NOTE — Telephone Encounter (Signed)
Called patient about her message. Patient wants to know if it is okay for her to be taking 3 Aleve tablets and her Nurtec pill every other week for migraines. Patient had read in her AVS about taking aspirin and ibuprofen, and she was concerned, because this is what she does for her migraines. Will send to Dr. Myles Gip for advisement.

## 2021-10-22 DIAGNOSIS — F4310 Post-traumatic stress disorder, unspecified: Secondary | ICD-10-CM | POA: Diagnosis not present

## 2021-10-22 DIAGNOSIS — F319 Bipolar disorder, unspecified: Secondary | ICD-10-CM | POA: Diagnosis not present

## 2021-10-22 DIAGNOSIS — F429 Obsessive-compulsive disorder, unspecified: Secondary | ICD-10-CM | POA: Diagnosis not present

## 2021-10-22 DIAGNOSIS — F502 Bulimia nervosa: Secondary | ICD-10-CM | POA: Diagnosis not present

## 2021-10-22 NOTE — Telephone Encounter (Signed)
Called patient back about message. Per Dr. Myles Gip, in general, NSAIDS will likely be safer than aspirin. Informed patient that on the information sheet for ablation that it is just cautioning patient about taking these medications because it puts them at a higher risk for bleeding. Informed patient that she should be fine taking an Aleve, NSAID, every now and then. Patient verbalized understanding and stated she does not take Aleve that often.

## 2021-10-24 NOTE — Telephone Encounter (Signed)
Called patient, no answer, left voice message per physician's recommendations to question asked.

## 2021-10-24 NOTE — Telephone Encounter (Signed)
Pt called back and is aware of provider's recommendations. Pt states she rescheduled her surgery since she didn't hear right back from [place].

## 2021-10-27 DIAGNOSIS — F319 Bipolar disorder, unspecified: Secondary | ICD-10-CM | POA: Diagnosis not present

## 2021-10-27 DIAGNOSIS — F429 Obsessive-compulsive disorder, unspecified: Secondary | ICD-10-CM | POA: Diagnosis not present

## 2021-10-27 DIAGNOSIS — F4310 Post-traumatic stress disorder, unspecified: Secondary | ICD-10-CM | POA: Diagnosis not present

## 2021-10-27 DIAGNOSIS — F502 Bulimia nervosa: Secondary | ICD-10-CM | POA: Diagnosis not present

## 2021-10-29 ENCOUNTER — Ambulatory Visit: Payer: Medicare PPO | Admitting: Podiatry

## 2021-10-30 NOTE — Pre-Procedure Instructions (Signed)
Instructed patient on the following items: °Arrival time 0530 °Nothing to eat or drink after midnight °No meds AM of procedure °Responsible person to drive you home and stay with you for 24 hrs ° ° °   °

## 2021-10-31 ENCOUNTER — Ambulatory Visit (HOSPITAL_BASED_OUTPATIENT_CLINIC_OR_DEPARTMENT_OTHER): Payer: Medicare PPO | Admitting: Anesthesiology

## 2021-10-31 ENCOUNTER — Other Ambulatory Visit: Payer: Self-pay

## 2021-10-31 ENCOUNTER — Telehealth: Payer: Self-pay | Admitting: Cardiovascular Disease

## 2021-10-31 ENCOUNTER — Encounter (HOSPITAL_COMMUNITY)
Admission: RE | Disposition: A | Discharge status: Home / Self Care | Payer: Self-pay | Source: Home / Self Care | Attending: Cardiovascular Disease

## 2021-10-31 ENCOUNTER — Ambulatory Visit (HOSPITAL_COMMUNITY)
Admission: RE | Admit: 2021-10-31 | Discharge: 2021-10-31 | Disposition: A | Discharge status: Home / Self Care | Payer: Medicare PPO | Attending: Cardiovascular Disease | Admitting: Cardiovascular Disease

## 2021-10-31 ENCOUNTER — Ambulatory Visit (HOSPITAL_COMMUNITY): Payer: Medicare PPO | Admitting: Anesthesiology

## 2021-10-31 DIAGNOSIS — K219 Gastro-esophageal reflux disease without esophagitis: Secondary | ICD-10-CM | POA: Diagnosis not present

## 2021-10-31 DIAGNOSIS — I471 Supraventricular tachycardia, unspecified: Secondary | ICD-10-CM | POA: Diagnosis not present

## 2021-10-31 DIAGNOSIS — Z87891 Personal history of nicotine dependence: Secondary | ICD-10-CM

## 2021-10-31 DIAGNOSIS — I4719 Other supraventricular tachycardia: Secondary | ICD-10-CM | POA: Diagnosis not present

## 2021-10-31 DIAGNOSIS — I739 Peripheral vascular disease, unspecified: Secondary | ICD-10-CM | POA: Diagnosis not present

## 2021-10-31 DIAGNOSIS — M1990 Unspecified osteoarthritis, unspecified site: Secondary | ICD-10-CM | POA: Diagnosis not present

## 2021-10-31 HISTORY — PX: SVT ABLATION: EP1225

## 2021-10-31 SURGERY — SVT ABLATION
Anesthesia: General

## 2021-10-31 MED ORDER — SODIUM CHLORIDE 0.9% FLUSH: 3.0000 mL | INTRAVENOUS | Status: DC | PRN

## 2021-10-31 MED ORDER — HEPARIN (PORCINE) IN NACL 1000-0.9 UT/500ML-% IV SOLN
INTRAVENOUS | Status: AC
  Filled 2021-10-31: qty 500

## 2021-10-31 MED ORDER — ONDANSETRON HCL 4 MG/2ML IJ SOLN: 4.0000 mg | Freq: Four times a day (QID) | INTRAMUSCULAR | Status: DC | PRN

## 2021-10-31 MED ORDER — SODIUM CHLORIDE 0.9 % IV SOLN: 250.0000 mL | INTRAVENOUS | Status: DC | PRN

## 2021-10-31 MED ORDER — BUPIVACAINE HCL (PF) 0.25 % IJ SOLN
INTRAMUSCULAR | Status: AC
  Filled 2021-10-31: qty 30

## 2021-10-31 MED ORDER — PROPOFOL 10 MG/ML IV BOLUS
INTRAVENOUS | Status: DC | PRN
  Administered 2021-10-31: 20 mg via INTRAVENOUS
  Administered 2021-10-31: 10 mg via INTRAVENOUS

## 2021-10-31 MED ORDER — ISOPROTERENOL HCL 0.2 MG/ML IJ SOLN
INTRAVENOUS | Status: DC | PRN
  Administered 2021-10-31: 2 ug/min via INTRAVENOUS

## 2021-10-31 MED ORDER — SODIUM CHLORIDE 0.9 % IV SOLN: INTRAVENOUS | Status: DC

## 2021-10-31 MED ORDER — METOPROLOL TARTRATE 12.5 MG HALF TABLET
12.5000 mg | ORAL_TABLET | Freq: Two times a day (BID) | ORAL | Status: DC
  Administered 2021-10-31: 12.5 mg via ORAL
  Filled 2021-10-31: qty 1

## 2021-10-31 MED ORDER — PROPOFOL 500 MG/50ML IV EMUL
INTRAVENOUS | Status: DC | PRN
  Administered 2021-10-31: 125 ug/kg/min via INTRAVENOUS
  Administered 2021-10-31: 75 ug/kg/min via INTRAVENOUS

## 2021-10-31 MED ORDER — FENTANYL CITRATE (PF) 100 MCG/2ML IJ SOLN
INTRAMUSCULAR | Status: DC | PRN
  Administered 2021-10-31 (×6): 25 ug via INTRAVENOUS
  Administered 2021-10-31: 50 ug via INTRAVENOUS

## 2021-10-31 MED ORDER — HEPARIN (PORCINE) IN NACL 1000-0.9 UT/500ML-% IV SOLN
INTRAVENOUS | Status: DC | PRN
  Administered 2021-10-31 (×2): 500 mL

## 2021-10-31 MED ORDER — SODIUM CHLORIDE 0.9% FLUSH: 3.0000 mL | Freq: Two times a day (BID) | INTRAVENOUS | Status: DC

## 2021-10-31 MED ORDER — MIDAZOLAM HCL 5 MG/5ML IJ SOLN
INTRAMUSCULAR | Status: DC | PRN
  Administered 2021-10-31 (×4): .5 mg via INTRAVENOUS
  Administered 2021-10-31: 2 mg via INTRAVENOUS

## 2021-10-31 MED ORDER — DEXAMETHASONE SODIUM PHOSPHATE 10 MG/ML IJ SOLN
INTRAMUSCULAR | Status: DC | PRN
  Administered 2021-10-31: 4 mg via INTRAVENOUS

## 2021-10-31 MED ORDER — PHENYLEPHRINE HCL-NACL 20-0.9 MG/250ML-% IV SOLN
INTRAVENOUS | Status: DC | PRN
  Administered 2021-10-31: 25 ug/min via INTRAVENOUS

## 2021-10-31 MED ORDER — ISOPROTERENOL HCL 0.2 MG/ML IJ SOLN
INTRAMUSCULAR | Status: AC
  Filled 2021-10-31: qty 5

## 2021-10-31 MED ORDER — ACETAMINOPHEN 325 MG PO TABS: 650.0000 mg | ORAL_TABLET | ORAL | Status: DC | PRN

## 2021-10-31 MED ORDER — ONDANSETRON HCL 4 MG/2ML IJ SOLN
INTRAMUSCULAR | Status: DC | PRN
  Administered 2021-10-31: 4 mg via INTRAVENOUS

## 2021-10-31 MED ORDER — BUPIVACAINE HCL (PF) 0.25 % IJ SOLN
INTRAMUSCULAR | Status: DC | PRN
  Administered 2021-10-31: 30 mL

## 2021-10-31 SURGICAL SUPPLY — 13 items
CATH DECANAV F CURVE (CATHETERS) IMPLANT
CATH EZ STEER NAV 4MM F-J CUR (ABLATOR) IMPLANT
CATH JOSEPH QUAD ALLRED 6F REP (CATHETERS) IMPLANT
DEVICE CLOSURE MYNXGRIP 6/7F (Vascular Products) IMPLANT
PACK EP LATEX FREE (CUSTOM PROCEDURE TRAY) ×1
PACK EP LF (CUSTOM PROCEDURE TRAY) ×1 IMPLANT
PAD DEFIB RADIO PHYSIO CONN (PAD) ×1 IMPLANT
PATCH CARTO3 (PAD) IMPLANT
SHEATH CARTO VIZIGO SM CVD (SHEATH) IMPLANT
SHEATH PINNACLE 6F 10CM (SHEATH) IMPLANT
SHEATH PINNACLE 7F 10CM (SHEATH) IMPLANT
SHEATH PINNACLE 8F 10CM (SHEATH) IMPLANT
SHEATH PROBE COVER 6X72 (BAG) IMPLANT

## 2021-10-31 NOTE — Telephone Encounter (Signed)
Pt c/o medication issue:  1. Name of Medication: metoprolol tartrate (LOPRESSOR) 25 MG tablet  2. How are you currently taking this medication (dosage and times per day)? Take 0.5 tablets (12.5 mg total) by mouth 2 (two) times daily.  3. Are you having a reaction (difficulty breathing--STAT)? no  4. What is your medication issue? Calling to see if if the patient suppose to still take this medication. Please advise

## 2021-10-31 NOTE — H&P (Signed)
Cardiology Office Note:    Date:  10/31/2021   ID:  Jennifer [name], DOB February 03, 1965, MRN [id number]  PCP:  Lindell Spar, MD   [place] Prov[facility] Cardiologist:  None     Referring MD: Melida Quitter, MD   Chief complaint: fast heart rhythm  History of Present Illness:    Jennifer [name] is a 56 y.o. female with a hx of SVT, PVD, Bipolar disorder, anxiety, depression, bulemia.  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  A colleague checked her pulse and noted to be over 200 beats a minute.  The arrhythmia had resolved prior to reaching the ER.  She has continued to have episodes lasting a few minutes a few times a year.  In August, she had sudden onset of palpitations. She went to an urgent care where an ECG was obtained showing a short R-P tachycardia at 134 bpm (see scan 09/11/2021) She was transferred to the [facility] ER but upon arrival she had converted to sinus rhythm. She was given diltiazem. She didn't tolerate this well and was switched to metoprolol.  She denies cheat pain, shortness of breath, ankle edema.  Arrhythmia History: First symptoms of tachycardia: about 7 years ago SVT diagnosed 09/11/2021 by ECG   Past Medical History:  Diagnosis Date   Abdominal pain, epigastric 10/07/2018   Acute non-recurrent maxillary sinusitis 11/07/2019   Anxiety    Arthritis    Binge-eating and purging type anorexia nervosa    Bipolar affective disorder ([place]) 12/04/2015   Bipolar affective disorder, current episode manic without psychotic symptoms ([place]) 12/04/2015   Bipolar disorder ([place])    Cellulitis and abscess of buttock 02/15/2013   Cystitis, interstitial    Depression    Depression    Phreesia 01/28/2020   Esophageal polyp    about 20 years ago   GERD (gastroesophageal reflux disease)    Left wrist pain 03/22/2019   OCD (obsessive compulsive disorder)    Palpitations 05/25/2014   Rectal pain 12/06/2018   Rib pain  on right side 01/31/2019   Rosacea 03/27/2019   Sprain of temporomandibular joint or ligament 01/10/2019    Past Surgical History:  Procedure Laterality Date   BLADDER SURGERY     CHOLECYSTECTOMY     COLONOSCOPY WITH PROPOFOL N/A 10/05/2019   Procedure: COLONOSCOPY WITH PROPOFOL;  Surgeon: Daneil Dolin, MD;  [facility];  Service: Endoscopy;  Laterality: N/A;  12:45pm   COSMETIC SURGERY N/A    Phreesia 01/28/2020   ESOPHAGOGASTRODUODENOSCOPY  09/28/2016   [facility]; Dr. Therisa Doyne; erosions in the esophagus, 5 cm hiatal hernia, nonbleeding erosive gastropathy s/p biopsied, normal duodenum.  Path with chronic inactive gastritis, no H. pylori or intestinal metaplasia.    ESOPHAGOGASTRODUODENOSCOPY (EGD) WITH PROPOFOL N/A 10/17/2018   Dr. Gala Romney: mild reflux esophagitis, small hiatal hernia   POLYPECTOMY  10/05/2019   Procedure: POLYPECTOMY;  Surgeon: Daneil Dolin, MD;  [facility];  Service: Endoscopy;;   VOCAL CORD LATERALIZATION, ENDOSCOPIC APPROACH W/ MLB      Current Medications: Current Meds  Medication Sig   Brexpiprazole ([name]) 4 MG TABS Take 4 mg by mouth in the morning.   clonazePAM (KLONOPIN) 0.5 MG tablet Take 1 tablet (0.5 mg total) by mouth 3 (three) times daily as needed for anxiety. (Patient taking differently: Take 0.25 mg by mouth 3 (three) times daily as needed for anxiety.)   colestipol (COLESTID) 1  g tablet Take 4 tablets orally two to three times daily for diarrhea. (Patient taking differently: Take 4 g by mouth 2 (two) times daily.)   cromolyn (OPTICROM) 4 % ophthalmic solution Place 1 drop into both eyes daily.   diclofenac Sodium (VOLTAREN) 1 % GEL Apply 4 g topically 4 (four) times daily. (Patient taking differently: Apply 4 g topically 4 (four) times daily as needed (pain).)   dicyclomine (BENTYL) 20 MG tablet Take 1 tablet (20 mg total) by mouth 3 (three) times daily as needed for spasms (Diarrhea).   Famotidine-Ca Carb-Mag Hydrox (PEPCID  COMPLETE PO) Take 1 tablet by mouth daily.   fluvoxaMINE (LUVOX) 100 MG tablet Take 3 tablets (300 mg total) by mouth at bedtime.   gabapentin (NEURONTIN) 600 MG tablet 2 p.o. every morning, 1 p.o. q. afternoon, 2 p.o. nightly. (Patient taking differently: Take 600-1,200 mg by mouth See admin instructions. 2 p.o. every morning, 1 p.o. q. afternoon, 2 p.o. nightly.)   Lactobacillus-Inulin (CULTURELLE DIGESTIVE DAILY PO) Take 1 capsule by mouth daily.   loratadine (CLARITIN) 5 MG chewable tablet Chew 5 mg by mouth in the morning and at bedtime.   metoprolol tartrate (LOPRESSOR) 25 MG tablet Take 0.5 tablets (12.5 mg total) by mouth 2 (two) times daily.   mirtazapine (REMERON) 15 MG tablet Take 1 tablet (15 mg total) by mouth at bedtime. (Patient taking differently: Take 15 mg by mouth at bedtime as needed (sleep).)   naproxen (NAPROSYN) 500 MG tablet Take 1,500 mg by mouth daily as needed for migraine.   pramipexole (MIRAPEX) 0.5 MG tablet Take 1 tablet (0.5 mg total) by mouth at bedtime.   prednisoLONE acetate (PRED FORTE) 1 % ophthalmic suspension Place 1 drop into both eyes daily.   RESTASIS 0.05 % ophthalmic emulsion Place 1 drop into both eyes 2 (two) times daily.   Rimegepant Sulfate (NURTEC) 75 MG TBDP Take 75 mg by mouth daily as needed (take for abortive therapy of migraine, no more than 1 tablet in 24 hours or 10 per month).   triamcinolone cream (KENALOG) 0.1 % Apply 1 application. topically 2 (two) times daily. (Patient taking differently: Apply 1 application  topically 2 (two) times daily as needed (irritation).)   valACYclovir (VALTREX) 500 MG tablet Take 500 mg by mouth daily.     Allergies:   Codeine, Penicillins, Divalproex sodium, Meloxicam, Sonata [zaleplon], Topamax [topiramate], Aciphex [rabeprazole], Aimovig [erenumab-aooe], and Galcanezumab-gnlm   Social History   Socioeconomic History   Marital status: Married    Spouse name: Todd   Number of children: 0   Years of  education: Not on file   Highest education level: Not on file  Occupational History   Not on file  Tobacco Use   Smoking status: Former    Packs/day: 0.50    Years: 20.00    Total pack years: 10.00    Types: Cigarettes    Start date: 01/20/1988    Quit date: 09/10/2021    Years since quitting: 0.1   Smokeless tobacco: Never  Vaping Use   Vaping Use: Never used  Substance and Sexual Activity   Alcohol use: No    Alcohol/week: 0.0 standard drinks of alcohol   Drug use: No   Sexual activity: Yes    Birth control/protection: None  Other Topics Concern   Not on file  Social History Narrative   Lives with husband -[name] of 85 years       Yorkie- Max      Enjoys: reading-all  genres      Diet: eats all food groups   Caffeine: 1 cup daily at times, diet dr pepper daily   Water: gatorade  zero and water       Wears seat belt    Does not use phone while driving   Smoke and Development worker, 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         Social Determinants of Health   Financial Resource Strain: Low Risk  (10/16/2021)   Overall Financial Resource Strain (CARDIA)    Difficulty of Paying Living Expenses: Not hard at all  Food Insecurity: No Food Insecurity (10/16/2021)   Hunger Vital Sign    Worried About Running Out of Food in the Last Year: Never true    Ran Out of Food in the Last Year: Never true  Transportation Needs: No Transportation Needs (10/16/2021)   PRAPARE - Hydrologist (Medical): No    Lack of Transportation (Non-Medical): No  Physical Activity: Inactive (10/16/2021)   Exercise Vital Sign    Days of Exercise per Week: 0 days    Minutes of Exercise per Session: 0 min  Stress: Stress Concern Present (10/16/2021)   [place]    Feeling of Stress : Very much  Social Connections: Socially Integrated (10/16/2021)   Social Connection and Isolation Panel [NHANES]     Frequency of Communication with Friends and Family: More than three times a week    Frequency of Social Gatherings with Friends and Family: Once a week    Attends Religious Services: More than 4 times per year    Active Member of [employer] or Organizations: Yes    Attends Music therapist: More than 4 times per year    Marital Status: Married     Family History: The patient's family history includes Healthy in her father and mother. There is no history of Colon cancer or Colon polyps.  ROS:   Please see the history of present illness.     All other systems reviewed and are negative.  EKGs/Labs/Other Studies Reviewed:    The following studies were reviewed today:  TTE 09/11/2021  1. Left ventricular ejection fraction, by estimation, is 65 to 70%. The left ventricle has normal function. The left ventricle has no regional wall motion abnormalities. Left ventricular diastolic parameters were normal.   2. Right ventricular systolic function is normal. The right ventricular size is normal. There is normal pulmonary artery systolic pressure.   3. Left atrial size was mildly dilated.   4. The mitral valve is normal in structure. No evidence of mitral valve regurgitation. No evidence of mitral stenosis.   5. The aortic valve has an indeterminant number of cusps. Aortic valve regurgitation is not visualized. No aortic stenosis is present.   6. The inferior vena cava is normal in size with greater than 50%  respiratory variability, suggesting right atrial pressure of 3 mmHg.   Event Monitor 04/29/2014: Normal rhythm and rate distribution  EKG:  EKG is ordered today.      Orders placed or performed in visit on 10/20/21   EKG 12-Lead     Recent Labs: 08/01/2021: ALT 23 09/10/2021: Magnesium 2.2; TSH 1.335 10/20/2021: BUN 15; Creatinine, Ser 0.92; Hemoglobin 14.2; Platelets 198; Potassium 4.5; Sodium 140     Physical Exam:    VS:  BP (!) 145/86   Pulse (!) 59   Temp (!)  97.2 F  (36.2 C) (Temporal)   Resp 16   Ht '5\' 4"'$  (1.626 m)   Wt 81.6 kg   LMP 10/07/2011   SpO2 96%   BMI 30.90 kg/m     Wt Readings from Last 3 Encounters:  10/31/21 81.6 kg  10/20/21 81.6 kg  10/01/21 81.6 kg     GEN:  Well nourished, well developed in no acute distress CARDIAC: RRR, no murmurs, rubs, gallops RESPIRATORY:  Normal work of breathing MUSCULOSKELETAL: no edema    ASSESSMENT & PLAN:    In order of problems listed above:  SVT: Appears to be a short R-P tachycardia, rate about 130 on the available ECG, but she reports rates approaching 200 in the past. I discussed management options with her including a conservative approach as she is now on, but she would be likely to have recurrence. I also offered her an EP study and potential SVT ablation. We discussed the indication, rationale, logistics, anticipated benefits, and potential risks of the ablation procedure including but not limited to -- bleed at the groin access site, chest pain, damage to nearby structure, need for a drainage tube, pacemaker, or prolonged hospitalization. I explained that the risk for stroke, heart attack, need for open chest surgery, or even death is very low but not zero. She expressed understanding and wishes to proceed.    Signed, Melida Quitter, MD  10/31/2021 7:16 AM    [place]

## 2021-10-31 NOTE — Anesthesia Preprocedure Evaluation (Addendum)
Anesthesia Evaluation  Patient identified by MRN, date of birth, ID band Patient awake    Reviewed: Allergy & Precautions, NPO status , Patient's Chart, lab work & pertinent test results  Airway Mallampati: II       Dental   Pulmonary former smoker   breath sounds clear to auscultation       Cardiovascular + Peripheral Vascular Disease   Rhythm:Regular Rate:Normal  Hx noted Dr. Nyoka Cowden   Neuro/Psych    GI/Hepatic Neg liver ROS,GERD  ,,  Endo/Other  negative endocrine ROS    Renal/GU negative Renal ROS     Musculoskeletal  (+) Arthritis ,    Abdominal   Peds  Hematology   Anesthesia Other Findings   Reproductive/Obstetrics                             Anesthesia Physical Anesthesia Plan  ASA: 3  Anesthesia Plan: MAC   Post-op Pain Management:    IntravenousInduction:   PONV Risk Score and Plan: 3 and Ondansetron, Dexamethasone, Midazolam and Treatment may vary due to age or medical condition  Airway Management Planned: Nasal Cannula and Simple Face Mask  Additional Equipment:   Intra-op Plan:   Post-operative Plan:   Informed Consent: I have reviewed the patients History and Physical, chart, labs and discussed the procedure including the risks, benefits and alternatives for the proposed anesthesia with the patient or authorized representative who has indicated his/her understanding and acceptance.     Dental advisory given  Plan Discussed with: CRNA, Anesthesiologist and Surgeon  Anesthesia Plan Comments:       Anesthesia Quick Evaluation

## 2021-10-31 NOTE — Discharge Instructions (Signed)

## 2021-10-31 NOTE — Anesthesia Postprocedure Evaluation (Signed)
Anesthesia Post Note  Patient: Jennifer [name]  Procedure(s) Performed: SVT ABLATION     Patient location during evaluation: Cath Lab Anesthesia Type: MAC Level of consciousness: awake Pain management: pain level controlled Respiratory status: spontaneous breathing Cardiovascular status: stable Postop Assessment: no apparent nausea or vomiting Anesthetic complications: no   There were no known notable events for this encounter.  Last Vitals:  Vitals:   10/31/21 1034 10/31/21 1040  BP: (!) 140/92 (!) 144/88  Pulse: 74 78  Resp: 13 15  Temp:    SpO2: 96% 96%    Last Pain:  Vitals:   10/31/21 1008  TempSrc:   PainSc: 0-No pain                 Domenico Achord

## 2021-10-31 NOTE — Telephone Encounter (Signed)
No discharge instructions noted, I will message MD

## 2021-10-31 NOTE — Telephone Encounter (Signed)
I advised patient to remain on BB until she has follow up with MD

## 2021-10-31 NOTE — Transfer of Care (Signed)
Immediate Anesthesia Transfer of Care Note  Patient: Jennifer [name]  Procedure(s) Performed: SVT ABLATION  Patient Location: Cath Lab  Anesthesia Type:MAC  Level of Consciousness: awake and alert   Airway & Oxygen Therapy: Patient Spontanous Breathing and Patient connected to nasal cannula oxygen  Post-op Assessment: Report given to RN and Post -op Vital signs reviewed and stable  Post vital signs: Reviewed and stable  Last Vitals:  Vitals Value Taken Time  BP 145/91 10/31/21 1021  Temp    Pulse 74 10/31/21 1021  Resp 13 10/31/21 1021  SpO2 97 % 10/31/21 1021  Vitals shown include unvalidated device data.  Last Pain:  Vitals:   10/31/21 0542  TempSrc: Temporal  PainSc:          Complications: There were no known notable events for this encounter.

## 2021-11-03 ENCOUNTER — Encounter (HOSPITAL_COMMUNITY): Payer: Self-pay | Admitting: Cardiovascular Disease

## 2021-11-05 DIAGNOSIS — F319 Bipolar disorder, unspecified: Secondary | ICD-10-CM | POA: Diagnosis not present

## 2021-11-05 DIAGNOSIS — F4310 Post-traumatic stress disorder, unspecified: Secondary | ICD-10-CM | POA: Diagnosis not present

## 2021-11-05 DIAGNOSIS — F502 Bulimia nervosa: Secondary | ICD-10-CM | POA: Diagnosis not present

## 2021-11-05 DIAGNOSIS — F429 Obsessive-compulsive disorder, unspecified: Secondary | ICD-10-CM | POA: Diagnosis not present

## 2021-11-07 ENCOUNTER — Ambulatory Visit: Payer: Medicare PPO | Admitting: Podiatry

## 2021-11-11 DIAGNOSIS — F429 Obsessive-compulsive disorder, unspecified: Secondary | ICD-10-CM | POA: Diagnosis not present

## 2021-11-11 DIAGNOSIS — F502 Bulimia nervosa: Secondary | ICD-10-CM | POA: Diagnosis not present

## 2021-11-11 DIAGNOSIS — F319 Bipolar disorder, unspecified: Secondary | ICD-10-CM | POA: Diagnosis not present

## 2021-11-11 DIAGNOSIS — F4310 Post-traumatic stress disorder, unspecified: Secondary | ICD-10-CM | POA: Diagnosis not present

## 2021-11-11 NOTE — Patient Instructions (Signed)
Below is our plan:  We will continue Nurtec and pramipexole as needed.   Please make sure you are staying well hydrated. I recommend 50-60 ounces daily. Well balanced diet and regular exercise encouraged. Consistent sleep schedule with 6-8 hours recommended.   Please continue follow up with care team as directed.   Follow up with me in 1 year   You may receive a survey regarding today's visit. I encourage you to leave honest feed back as I do use this information to improve patient care. Thank you for seeing me today!
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
## 2021-11-12 ENCOUNTER — Ambulatory Visit: Payer: Medicare PPO | Admitting: Family Medicine

## 2021-11-12 ENCOUNTER — Telehealth: Payer: Self-pay | Admitting: Cardiovascular Disease

## 2021-11-12 ENCOUNTER — Encounter: Payer: Self-pay | Admitting: Family Medicine

## 2021-11-12 VITALS — BP 111/75 | HR 90 | Ht 64.0 in | Wt 180.6 lb

## 2021-11-12 DIAGNOSIS — G2581 Restless legs syndrome: Secondary | ICD-10-CM

## 2021-11-12 DIAGNOSIS — G43709 Chronic migraine without aura, not intractable, without status migrainosus: Secondary | ICD-10-CM | POA: Diagnosis not present

## 2021-11-12 DIAGNOSIS — G629 Polyneuropathy, unspecified: Secondary | ICD-10-CM | POA: Diagnosis not present

## 2021-11-12 MED ORDER — PRAMIPEXOLE DIHYDROCHLORIDE 0.5 MG PO TABS: 0.5000 mg | ORAL_TABLET | Freq: Every evening | ORAL | 3 refills | Status: DC | PRN

## 2021-11-12 MED ORDER — NURTEC 75 MG PO TBDP: 75.0000 mg | ORAL_TABLET | Freq: Every day | ORAL | 11 refills | Status: DC | PRN

## 2021-11-12 NOTE — Telephone Encounter (Signed)
Patient reports that she stopped taking her metoprolol about 2 days after her procedure. She states that she was having trouble remembering to take it and finally just decided to stop all together. She states that she has been feeling good and reports no episodes of heart racing since ablation. She does monitor her blood pressure or heart rate although at her neurology appointment this morning BP was 11/75 and HR 90. Advised her to start monitoring at home. She was previously advised to continue metoprolol until she was seen by the provider, however she has now been off of it for about 10 days.

## 2021-11-12 NOTE — Telephone Encounter (Signed)
Pt called in to notify Dr. Myles Gip that she has not been taking her metoprolol everyday as instructed and she said she is having a hard time remembering to take it.

## 2021-11-14 ENCOUNTER — Encounter: Payer: Self-pay | Admitting: Physician Assistant

## 2021-11-14 ENCOUNTER — Telehealth (INDEPENDENT_AMBULATORY_CARE_PROVIDER_SITE_OTHER): Payer: Medicare PPO | Admitting: Physician Assistant

## 2021-11-14 ENCOUNTER — Telehealth: Payer: Medicare PPO | Admitting: Physician Assistant

## 2021-11-14 DIAGNOSIS — F319 Bipolar disorder, unspecified: Secondary | ICD-10-CM

## 2021-11-14 DIAGNOSIS — F5105 Insomnia due to other mental disorder: Secondary | ICD-10-CM | POA: Diagnosis not present

## 2021-11-14 DIAGNOSIS — F429 Obsessive-compulsive disorder, unspecified: Secondary | ICD-10-CM

## 2021-11-14 DIAGNOSIS — F502 Bulimia nervosa: Secondary | ICD-10-CM

## 2021-11-14 DIAGNOSIS — F17200 Nicotine dependence, unspecified, uncomplicated: Secondary | ICD-10-CM

## 2021-11-14 DIAGNOSIS — F99 Mental disorder, not otherwise specified: Secondary | ICD-10-CM

## 2021-11-14 MED ORDER — ONDANSETRON 4 MG PO TBDP: 4.0000 mg | ORAL_TABLET | Freq: Three times a day (TID) | ORAL | 0 refills | Status: DC | PRN

## 2021-11-14 NOTE — Progress Notes (Unsigned)
[facility]  Patient ID: KAIYA BOATMAN,  MRN: [id number]  PCP: Lindell Spar, MD  Date of Evaluation: 11/14/2021  time spent:30 minutes  Chief Complaint:  Chief Complaint   Anxiety; Depression; Insomnia; Follow-up    Virtual Visit via Telehealth  I connected with patient by a video enabled telemedicine application, with their informed consent, and verified patient privacy and that I am speaking with the correct person using two identifiers.  I am private, in my office and the patient is at home.  I discussed the limitations, risks, security and privacy concerns of performing an evaluation and management service by video and the availability of in person appointments. I also discussed with the patient that there may be a patient responsible charge related to this service. The patient expressed understanding and agreed to proceed.   I discussed the assessment and treatment plan with the patient. The patient was provided an opportunity to ask questions and all were answered. The patient agreed with the plan and demonstrated an understanding of the instructions.   The patient was advised to call back or seek an in-person evaluation if the symptoms worsen or if the condition fails to improve as anticipated.  I provided 30 minutes of non-face-to-face time during this encounter.  HISTORY/CURRENT STATUS: HPI having dry heaves  States she is having dry heaves every morning when she gets up for the past 2 weeks.  States it is terrible.  She is still binging and purging.  The past few days have been bad for that.  Became really nervous when she read the side effects of [name], possibly causing gambling or binge eating.  Not sure if the dry heaving started around that same time or not.  Nothing has changed medication wise except she has needed the Klonopin and hydroxyzine a bit more often recently.   Denies dizziness, syncope, seizures, numbness, tingling, tremor, tics, unsteady  gait, slurred speech, confusion.  No akathisia.  The mirtazapine has really helped that.  Has chronic joint pain. Denies unexplained weight loss, frequent infections, or sores that heal slowly.  No polyphagia, polydipsia, or polyuria. Denies visual changes or paresthesias.   Individual Medical History/ Review of Systems: Changes? :Yes     she had cardiac ablation on 10/31/2021 for SVT     Past medications for mental health diagnoses include: Risperdal, Seroquel, Prozac, Zoloft,  Wellbutrin, Lamictal, Depakote caused hair loss, Xanax, Ambien, trazodone, Trileptal, Luvox, Topamax, Elavil, Pamelor, BuSpar, doxazosin, prazosin, lithium, Vraylar, Abilify, [name], Latuda >60 mg caused abd pain and nausea, propranolol caused diarrhea  Allergies: Codeine, Penicillins, Divalproex sodium, Meloxicam, Sonata [zaleplon], Topamax [topiramate], Aciphex [rabeprazole], Aimovig [erenumab-aooe], and Galcanezumab-gnlm  Current Medications:  Current Outpatient Medications:    Brexpiprazole ([name]) 4 MG TABS, Take 4 mg by mouth in the morning., Disp: 30 tablet, Rfl: 11   clonazePAM (KLONOPIN) 0.5 MG tablet, Take 1 tablet (0.5 mg total) by mouth 3 (three) times daily as needed for anxiety. (Patient taking differently: Take 0.25 mg by mouth 3 (three) times daily as needed for anxiety.), Disp: 30 tablet, Rfl: 5   colestipol (COLESTID) 1 g tablet, Take 4 tablets orally two to three times daily for diarrhea. (Patient taking differently: Take 4 g by mouth 2 (two) times daily.), Disp: 300 tablet, Rfl: 1   cromolyn (OPTICROM) 4 % ophthalmic solution, Place 1 drop into both eyes daily., Disp: , Rfl:    diclofenac Sodium (VOLTAREN) 1 % GEL, Apply 4 g topically 4 (four) times daily. (Patient taking  differently: Apply 4 g topically 4 (four) times daily as needed (pain).), Disp: 50 g, Rfl: 0   dicyclomine (BENTYL) 20 MG tablet, Take 1 tablet (20 mg total) by mouth 3 (three) times daily as needed for spasms (Diarrhea)., Disp: 30  tablet, Rfl: 1   Famotidine-Ca Carb-Mag Hydrox (PEPCID COMPLETE PO), Take 1 tablet by mouth daily., Disp: , Rfl:    fluvoxaMINE (LUVOX) 100 MG tablet, Take 3 tablets (300 mg total) by mouth at bedtime., Disp: 270 tablet, Rfl: 3   gabapentin (NEURONTIN) 600 MG tablet, 2 p.o. every morning, 1 p.o. q. afternoon, 2 p.o. nightly. (Patient taking differently: Take 600-1,200 mg by mouth See admin instructions. 2 p.o. every morning, 1 p.o. q. afternoon, 2 p.o. nightly.), Disp: 150 tablet, Rfl: 5   hydrOXYzine (ATARAX) 25 MG tablet, Take 1-2 tablets (25-50 mg total) by mouth every 8 (eight) hours as needed., Disp: 90 tablet, Rfl: 11   Lactobacillus-Inulin (CULTURELLE DIGESTIVE DAILY PO), Take 1 capsule by mouth daily., Disp: , Rfl:    loratadine (CLARITIN) 5 MG chewable tablet, Chew 5 mg by mouth in the morning and at bedtime., Disp: , Rfl:    mirtazapine (REMERON) 15 MG tablet, Take 1 tablet (15 mg total) by mouth at bedtime., Disp: 90 tablet, Rfl: 3   naproxen (NAPROSYN) 500 MG tablet, Take 1,500 mg by mouth as needed for migraine., Disp: , Rfl:    pramipexole (MIRAPEX) 0.5 MG tablet, Take 1 tablet (0.5 mg total) by mouth at bedtime as needed., Disp: 90 tablet, Rfl: 3   prednisoLONE acetate (PRED FORTE) 1 % ophthalmic suspension, Place 1 drop into both eyes daily., Disp: , Rfl:    RESTASIS 0.05 % ophthalmic emulsion, Place 1 drop into both eyes 2 (two) times daily., Disp: , Rfl:    Rimegepant Sulfate (NURTEC) 75 MG TBDP, Take 75 mg by mouth daily as needed (take for abortive therapy of migraine, no more than 1 tablet in 24 hours or 8 per month)., Disp: 8 tablet, Rfl: 11   triamcinolone cream (KENALOG) 0.1 %, Apply 1 application. topically 2 (two) times daily. (Patient taking differently: Apply 1 application  topically 2 (two) times daily as needed (irritation).), Disp: 30 g, Rfl: 0   valACYclovir (VALTREX) 500 MG tablet, Take 500 mg by mouth daily., Disp: , Rfl:    metoprolol tartrate (LOPRESSOR) 25 MG  tablet, Take 0.5 tablets (12.5 mg total) by mouth 2 (two) times daily. (Patient not taking: Reported on 11/12/2021), Disp: 90 tablet, Rfl: 3   ondansetron (ZOFRAN-ODT) 4 MG disintegrating tablet, Take 1 tablet (4 mg total) by mouth every 8 (eight) hours as needed for nausea or vomiting., Disp: 15 tablet, Rfl: 0 Medication Side Effects: none  Family Medical/ Social History: Changes?  No  MENTAL HEALTH EXAM:  Last menstrual period 10/07/2011.There is no height or weight on file to calculate BMI.  General Appearance: Casual and Well Groomed  Eye Contact:  Good  Speech:  Clear and Coherent and Normal Rate  Volume:  Normal  Mood:  Euthymic  Affect:  Congruent  Thought Process:  Goal Directed and Descriptions of Associations: Circumstantial  Orientation:  Full (Time, Place, and Person)  Thought Content: Logical   Suicidal Thoughts:  No  Homicidal Thoughts:  No  Memory:  WNL  Judgement:  Good  Insight:  Good  Psychomotor Activity:  Normal  Concentration:  Concentration: Good and Attention Span: Good  Recall:  Good  Fund of Knowledge: Good  Language: Good  Assets:  Desire  for Improvement  ADL's:  Intact  Cognition: WNL  Prognosis:  Good   DIAGNOSES:  No diagnosis found.  Receiving Psychotherapy: Yes  [facility], another therapist for brain-spotting  RECOMMENDATIONS:  PDMP was reviewed.  Last Klonopin filled 09/09/2021. I provided 30 minutes of non-face to face time during this encounter, including time spent before and after the visit in records review, medical decision making, counseling pertinent to today's visit, and charting.   It is hard to say what is causing the acute nausea and dry heaves.  With her history of bulimia, it is hard to to say.  She has taken Zofran in the past for nausea and vomiting associated with migraines, and since she has been unable to get a prescription I am sending in 15 pills.  I suggest she see her gastroenterologist soon.  Of course if any other  symptoms arise such as fever cough or cold symptoms, urinary tract symptoms etc. see her PCP or go to urgent care.  She understands.  As far as her mental health medications go she is doing well so no changes will be made.  Continue [name] 4 mg, 1 p.o. every morning. Continue Klonopin 0.5 mg, 1 p.o. twice daily prn. Continue Luvox 100 mg, 3 p.o. nightly. Continue gabapentin 600 mg, 2 qam.,  3 p.o. nightly.  Continue hydroxyzine 25-50 mg, 1 p.o. 3 times daily as needed Continue mirtazapine 15 mg, 1 p.o. nightly  Restart Zofran ODT 4 mg, 1 every 8 hours as needed nausea or vomiting. Continue pramipexole 0.5 mg nightly.  Per another provider. Continue therapy. Return in 1 month.  [name], PA-C

## 2021-11-14 NOTE — Telephone Encounter (Signed)
Spoke with patient and advised her Dr. Myles Gip was okay with her stopping metoprolol.   Correct blood pressure from previous note was 111/75.

## 2021-11-19 DIAGNOSIS — F502 Bulimia nervosa: Secondary | ICD-10-CM | POA: Diagnosis not present

## 2021-11-19 DIAGNOSIS — F319 Bipolar disorder, unspecified: Secondary | ICD-10-CM | POA: Diagnosis not present

## 2021-11-19 DIAGNOSIS — F429 Obsessive-compulsive disorder, unspecified: Secondary | ICD-10-CM | POA: Diagnosis not present

## 2021-11-19 DIAGNOSIS — F4310 Post-traumatic stress disorder, unspecified: Secondary | ICD-10-CM | POA: Diagnosis not present

## 2021-11-24 DIAGNOSIS — F429 Obsessive-compulsive disorder, unspecified: Secondary | ICD-10-CM | POA: Diagnosis not present

## 2021-11-24 DIAGNOSIS — F319 Bipolar disorder, unspecified: Secondary | ICD-10-CM | POA: Diagnosis not present

## 2021-11-24 DIAGNOSIS — F4310 Post-traumatic stress disorder, unspecified: Secondary | ICD-10-CM | POA: Diagnosis not present

## 2021-11-24 DIAGNOSIS — F502 Bulimia nervosa: Secondary | ICD-10-CM | POA: Diagnosis not present

## 2021-11-25 ENCOUNTER — Encounter: Payer: Self-pay | Admitting: Physician Assistant

## 2021-11-25 ENCOUNTER — Telehealth (INDEPENDENT_AMBULATORY_CARE_PROVIDER_SITE_OTHER): Payer: Medicare PPO | Admitting: Physician Assistant

## 2021-11-25 DIAGNOSIS — F17200 Nicotine dependence, unspecified, uncomplicated: Secondary | ICD-10-CM | POA: Diagnosis not present

## 2021-11-25 DIAGNOSIS — F429 Obsessive-compulsive disorder, unspecified: Secondary | ICD-10-CM | POA: Diagnosis not present

## 2021-11-25 DIAGNOSIS — F99 Mental disorder, not otherwise specified: Secondary | ICD-10-CM | POA: Diagnosis not present

## 2021-11-25 DIAGNOSIS — F5105 Insomnia due to other mental disorder: Secondary | ICD-10-CM | POA: Diagnosis not present

## 2021-11-25 DIAGNOSIS — F3130 Bipolar disorder, current episode depressed, mild or moderate severity, unspecified: Secondary | ICD-10-CM | POA: Diagnosis not present

## 2021-11-25 DIAGNOSIS — F411 Generalized anxiety disorder: Secondary | ICD-10-CM | POA: Diagnosis not present

## 2021-11-25 NOTE — Progress Notes (Signed)
[facility]  Patient ID: Jennifer [name],  MRN: [id number]  PCP: Lindell Spar, MD  Date of Evaluation: 11/25/2021  time spent:30 minutes  Chief Complaint:  Chief Complaint   Depression    Virtual Visit via Telehealth  I connected with patient by a video enabled telemedicine application, with their informed consent, and verified patient privacy and that I am speaking with the correct person using two identifiers.  I am private, in my office and the patient is at home.  I discussed the limitations, risks, security and privacy concerns of performing an evaluation and management service by video and the availability of in person appointments. I also discussed with the patient that there may be a patient responsible charge related to this service. The patient expressed understanding and agreed to proceed.   I discussed the assessment and treatment plan with the patient. The patient was provided an opportunity to ask questions and all were answered. The patient agreed with the plan and demonstrated an understanding of the instructions.   The patient was advised to call back or seek an in-person evaluation if the symptoms worsen or if the condition fails to improve as anticipated.  I provided 30 minutes of non-face-to-face time during this encounter.  HISTORY/CURRENT STATUS: HPI depression is worse   For the past week she has been crying a lot.  No known reason.  Had brain spotting yesterday which made her cry more but it was not the cause in the beginning. For the past week, hasn't wanted to do anything. Not much energy or motivation.  Has not been sleeping very well and took her mirtazapine last night which did help.  She feels better today physically and emotionally.  ADLs and per [place] are normal.  Appetite is normal.  She is not having the extreme nausea and retching like she reported at our visit approximately 10 days ago.  Taking the Zofran has helped.  She has an  appointment to see her GI December 4.  Patient stopped taking the probiotic a couple of months ago due to expense.  Not sure any of that is related.  No suicidal or homicidal thoughts.  Anxiety is not well-controlled.  She has felt more nervous over the past week or so, she tends to rub her hands together or rub her thighs when she is sitting down, those are classic signs of anxiety for her.  She has tried to avoid taking Klonopin, she does not want to overtake it but also it seems to "bring her down" even more when she is feeling this depressed.  She is also obsessing about things, not having compulsions as she has in the past though.  Patient denies increased energy with decreased need for sleep, increased talkativeness, racing thoughts, impulsivity or risky behaviors, increased spending, increased libido, grandiosity, increased irritability or anger, paranoia, or hallucinations.  Denies dizziness, syncope, seizures, numbness, tingling, tremor, tics, unsteady gait, slurred speech, confusion.  No akathisia.  The mirtazapine has really helped that.  Has chronic joint pain. Denies unexplained weight loss, frequent infections, or sores that heal slowly.  No polyphagia, polydipsia, or polyuria. Denies visual changes or paresthesias.   Individual Medical History/ Review of Systems: Changes? :No          Past medications for mental health diagnoses include: Risperdal, Seroquel, Prozac, Zoloft,  Wellbutrin, Lamictal, Depakote caused hair loss, Xanax, Ambien, trazodone, Trileptal, Luvox, Topamax, Elavil, Pamelor, BuSpar, doxazosin, prazosin, lithium, Vraylar, Abilify, [name], Latuda >60 mg caused abd  pain and nausea, propranolol caused diarrhea  Allergies: Codeine, Penicillins, Divalproex sodium, Meloxicam, Sonata [zaleplon], Topamax [topiramate], Aciphex [rabeprazole], Aimovig [erenumab-aooe], and Galcanezumab-gnlm  Current Medications:  Current Outpatient Medications:    Brexpiprazole ([name]) 4 MG  TABS, Take 4 mg by mouth in the morning., Disp: 30 tablet, Rfl: 11   clonazePAM (KLONOPIN) 0.5 MG tablet, Take 1 tablet (0.5 mg total) by mouth 3 (three) times daily as needed for anxiety. (Patient taking differently: Take 0.25 mg by mouth 3 (three) times daily as needed for anxiety.), Disp: 30 tablet, Rfl: 5   colestipol (COLESTID) 1 g tablet, Take 4 tablets orally two to three times daily for diarrhea. (Patient taking differently: Take 4 g by mouth 2 (two) times daily.), Disp: 300 tablet, Rfl: 1   cromolyn (OPTICROM) 4 % ophthalmic solution, Place 1 drop into both eyes daily., Disp: , Rfl:    diclofenac Sodium (VOLTAREN) 1 % GEL, Apply 4 g topically 4 (four) times daily. (Patient taking differently: Apply 4 g topically 4 (four) times daily as needed (pain).), Disp: 50 g, Rfl: 0   dicyclomine (BENTYL) 20 MG tablet, Take 1 tablet (20 mg total) by mouth 3 (three) times daily as needed for spasms (Diarrhea)., Disp: 30 tablet, Rfl: 1   Famotidine-Ca Carb-Mag Hydrox (PEPCID COMPLETE PO), Take 1 tablet by mouth daily., Disp: , Rfl:    fluvoxaMINE (LUVOX) 100 MG tablet, Take 3 tablets (300 mg total) by mouth at bedtime., Disp: 270 tablet, Rfl: 3   gabapentin (NEURONTIN) 600 MG tablet, 2 p.o. every morning, 1 p.o. q. afternoon, 2 p.o. nightly. (Patient taking differently: Take 600-1,200 mg by mouth See admin instructions. 2 p.o. every morning, 1 p.o. q. afternoon, 2 p.o. nightly.), Disp: 150 tablet, Rfl: 5   hydrOXYzine (ATARAX) 25 MG tablet, Take 1-2 tablets (25-50 mg total) by mouth every 8 (eight) hours as needed., Disp: 90 tablet, Rfl: 11   loratadine (CLARITIN) 5 MG chewable tablet, Chew 5 mg by mouth in the morning and at bedtime., Disp: , Rfl:    mirtazapine (REMERON) 15 MG tablet, Take 1 tablet (15 mg total) by mouth at bedtime., Disp: 90 tablet, Rfl: 3   naproxen (NAPROSYN) 500 MG tablet, Take 1,500 mg by mouth as needed for migraine., Disp: , Rfl:    ondansetron (ZOFRAN-ODT) 4 MG disintegrating  tablet, Take 1 tablet (4 mg total) by mouth every 8 (eight) hours as needed for nausea or vomiting., Disp: 15 tablet, Rfl: 0   pramipexole (MIRAPEX) 0.5 MG tablet, Take 1 tablet (0.5 mg total) by mouth at bedtime as needed., Disp: 90 tablet, Rfl: 3   prednisoLONE acetate (PRED FORTE) 1 % ophthalmic suspension, Place 1 drop into both eyes daily., Disp: , Rfl:    RESTASIS 0.05 % ophthalmic emulsion, Place 1 drop into both eyes 2 (two) times daily., Disp: , Rfl:    Rimegepant Sulfate (NURTEC) 75 MG TBDP, Take 75 mg by mouth daily as needed (take for abortive therapy of migraine, no more than 1 tablet in 24 hours or 8 per month)., Disp: 8 tablet, Rfl: 11   valACYclovir (VALTREX) 500 MG tablet, Take 500 mg by mouth daily., Disp: , Rfl:    Lactobacillus-Inulin (CULTURELLE DIGESTIVE DAILY PO), Take 1 capsule by mouth daily. (Patient not taking: Reported on 11/25/2021), Disp: , Rfl:    metoprolol tartrate (LOPRESSOR) 25 MG tablet, Take 0.5 tablets (12.5 mg total) by mouth 2 (two) times daily. (Patient not taking: Reported on 11/12/2021), Disp: 90 tablet, Rfl: 3  triamcinolone cream (KENALOG) 0.1 %, Apply 1 application. topically 2 (two) times daily. (Patient not taking: Reported on 11/25/2021), Disp: 30 g, Rfl: 0 Medication Side Effects: none  Family Medical/ Social History: Changes?  No  MENTAL HEALTH EXAM:  Last menstrual period 10/07/2011.There is no height or weight on file to calculate BMI.  General Appearance: Casual and Well Groomed  Eye Contact:  Good  Speech:  Clear and Coherent and Normal Rate  Volume:  Normal  Mood:  Anxious and Depressed  Affect:  Depressed and Anxious  Thought Process:  Goal Directed and Descriptions of Associations: Circumstantial  Orientation:  Full (Time, Place, and Person)  Thought Content: Logical   Suicidal Thoughts:  No  Homicidal Thoughts:  No  Memory:  WNL  Judgement:  Good  Insight:  Good  Psychomotor Activity:  Normal  Concentration:  Concentration: Good  and Attention Span: Good  Recall:  Good  Fund of Knowledge: Good  Language: Good  Assets:  Desire for Improvement  ADL's:  Intact  Cognition: WNL  Prognosis:  Good   DIAGNOSES:    ICD-10-CM   1. Bipolar I disorder, most recent episode depressed ([place])  F31.30     2. Obsessive-compulsive disorder, unspecified type  F42.9     3. Insomnia due to other mental disorder  F51.05    F99     4. Generalized anxiety disorder  F41.1     5. Smoker  F17.200       Receiving Psychotherapy: Yes  [facility], and another therapist for brain-spotting  RECOMMENDATIONS:  PDMP was reviewed.  Last Klonopin filled 09/09/2021. I provided 30 minutes of non-face to face time during this encounter, including time spent before and after the visit in records review, medical decision making, counseling pertinent to today's visit, and charting.   We discussed the depression.  There could be a number of factors in play, including the fact that she stopped taking the probiotic a couple of months ago.  Gut-brain health is associated.  I recommend she get back on the probiotic, even if she takes it every other day.  Another factor is that Luvox has pooped out.  She understands that 300 mg is the usual maximum dose but according to the Prescribers Guide by Dr. Elsie Saas, some patients need a higher dose.  I recommend increasing that.  She would like to try it.  She knows to watch for any signs/symptoms of serotonin syndrome and call immediately if they occur. Smoking cessation discussed.  Continue [name] 4 mg, 1 p.o. every morning. Continue Klonopin 0.5 mg, 1 p.o. twice daily prn. Increase Luvox 100 mg to 3.5 pills nightly. Continue gabapentin 600 mg, 2 qam.,  3 p.o. nightly.  Continue hydroxyzine 25-50 mg, 1 p.o. 3 times daily as needed Continue mirtazapine 15 mg, 1 p.o. nightly  Continue pramipexole 0.5 mg nightly.  Per another provider. Restart probiotic.  Healthy diet and exercise discussed. Continue  therapy. Return in 3 weeks.  [name], PA-C

## 2021-11-26 DIAGNOSIS — F319 Bipolar disorder, unspecified: Secondary | ICD-10-CM | POA: Diagnosis not present

## 2021-11-26 DIAGNOSIS — F4310 Post-traumatic stress disorder, unspecified: Secondary | ICD-10-CM | POA: Diagnosis not present

## 2021-11-26 DIAGNOSIS — F429 Obsessive-compulsive disorder, unspecified: Secondary | ICD-10-CM | POA: Diagnosis not present

## 2021-11-26 DIAGNOSIS — F502 Bulimia nervosa: Secondary | ICD-10-CM | POA: Diagnosis not present

## 2021-11-27 ENCOUNTER — Other Ambulatory Visit: Payer: Self-pay | Admitting: Internal Medicine

## 2021-11-27 ENCOUNTER — Telehealth: Payer: Self-pay | Admitting: Internal Medicine

## 2021-11-27 NOTE — Telephone Encounter (Signed)
Pt called stating that she is getting irritated when wiping. 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 States the area getting inflamed one day & gone next. Wants to know if she can get some fanny cream?   Fairlawn Phar

## 2021-11-28 NOTE — Telephone Encounter (Signed)
Patient aware.

## 2021-12-01 ENCOUNTER — Ambulatory Visit (HOSPITAL_COMMUNITY)
Admission: RE | Admit: 2021-12-01 | Discharge: 2021-12-01 | Disposition: A | Discharge status: Home / Self Care | Payer: Medicare PPO | Source: Ambulatory Visit | Attending: Nurse Practitioner | Admitting: Nurse Practitioner

## 2021-12-01 DIAGNOSIS — J439 Emphysema, unspecified: Secondary | ICD-10-CM | POA: Insufficient documentation

## 2021-12-01 DIAGNOSIS — F17210 Nicotine dependence, cigarettes, uncomplicated: Secondary | ICD-10-CM | POA: Insufficient documentation

## 2021-12-01 DIAGNOSIS — K449 Diaphragmatic hernia without obstruction or gangrene: Secondary | ICD-10-CM | POA: Diagnosis not present

## 2021-12-01 DIAGNOSIS — Z122 Encounter for screening for malignant neoplasm of respiratory organs: Secondary | ICD-10-CM | POA: Diagnosis not present

## 2021-12-01 DIAGNOSIS — I700 Atherosclerosis of aorta: Secondary | ICD-10-CM | POA: Diagnosis not present

## 2021-12-02 ENCOUNTER — Ambulatory Visit: Payer: Medicare PPO | Attending: Cardiovascular Disease | Admitting: Cardiovascular Disease

## 2021-12-02 ENCOUNTER — Encounter: Payer: Self-pay | Admitting: Cardiovascular Disease

## 2021-12-02 VITALS — BP 112/74 | HR 76 | Ht 64.0 in | Wt 181.0 lb

## 2021-12-02 DIAGNOSIS — I471 Supraventricular tachycardia, unspecified: Secondary | ICD-10-CM | POA: Diagnosis not present

## 2021-12-02 NOTE — Addendum Note (Signed)
Addended by: Bernestine Amass on: 12/02/2021 10:58 AM   Modules accepted: Orders

## 2021-12-02 NOTE — Patient Instructions (Signed)
Medication Instructions:  Your physician recommends that you continue on your current medications as directed. Please refer to the Current Medication list given to you today.  *If you need a refill on your cardiac medications before your next appointment, please call your pharmacy*   Follow-Up: At [facility], you and your health needs are our priority.  As part of our continuing mission to provide you with exceptional heart care, we have created designated Provider Care Teams.  These Care Teams include your primary Cardiologist (physician) and Advanced Practice Providers (APPs -  Physician Assistants and Nurse Practitioners) who all work together to provide you with the care you need, when you need it.  Your next appointment:   1 year(s)  The format for your next appointment:   In Person  Provider:   You may see Augustus E Mealor, MD or one of the following Advanced Practice Providers on your designated Care Team:   Renee Ursuy, PA-C Michael "Andy" Tillery, PA-C    Important Information About Sugar       

## 2021-12-02 NOTE — Progress Notes (Signed)
Cardiology Office Note:    Date:  12/02/2021   ID:  Jennifer [name], DOB [birth year], MRN [id number]  PCP:  Lindell Spar, MD   [place] Providers Cardiologist:  None Electrophysiologist:  Melida Quitter, MD     Referring MD: Lindell Spar, MD   Chief complaint: fast heart rhythm  History of Present Illness:    Jennifer [name] is a 56 y.o. female with a hx of SVT, PVD, Bipolar disorder, anxiety, depression, bulemia.  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  A colleague checked her pulse and noted to be over 200 beats a minute.  The arrhythmia had resolved prior to reaching the ER.  She has continued to have episodes lasting a few minutes a few times a year.  In August, she had sudden onset of palpitations. She went to an urgent care where an ECG was obtained showing a short R-P tachycardia at 134 bpm (see scan 09/11/2021) She was transferred to the [facility] ER but upon arrival she had converted to sinus rhythm. She was given diltiazem. She didn't tolerate this well and was switched to metoprolol.  She underwent EP study in October. 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 She has been doing well since without any symptoms of recurrence.  She denies cheat pain, shortness of breath, ankle edema.  Arrhythmia History: First symptoms of tachycardia: about 7 years ago SVT diagnosed 09/11/2021 by ECG   Past Medical History:  Diagnosis Date   Abdominal pain, epigastric 10/07/2018   Acute non-recurrent maxillary sinusitis 11/07/2019   Anxiety    Arthritis    Binge-eating and purging type anorexia nervosa    Bipolar affective disorder ([place]) 12/04/2015   Bipolar affective disorder, current episode manic without psychotic symptoms ([place]) 12/04/2015   Bipolar disorder ([place])    Cellulitis and abscess of buttock 02/15/2013   Cystitis, interstitial    Depression    Depression    Phreesia 01/28/2020   Esophageal polyp     about 20 years ago   GERD (gastroesophageal reflux disease)    Left wrist pain 03/22/2019   OCD (obsessive compulsive disorder)    Palpitations 05/25/2014   Rectal pain 12/06/2018   Rib pain on right side 01/31/2019   Rosacea 03/27/2019   Sprain of temporomandibular joint or ligament 01/10/2019    Past Surgical History:  Procedure Laterality Date   BLADDER SURGERY     CHOLECYSTECTOMY     COLONOSCOPY WITH PROPOFOL N/A 10/05/2019   Procedure: COLONOSCOPY WITH PROPOFOL;  Surgeon: Daneil Dolin, MD;  [facility];  Service: Endoscopy;  Laterality: N/A;  12:45pm   COSMETIC SURGERY N/A    Phreesia 01/28/2020   ESOPHAGOGASTRODUODENOSCOPY  09/28/2016   [facility]; Dr. Therisa Doyne; erosions in the esophagus, 5 cm hiatal hernia, nonbleeding erosive gastropathy s/p biopsied, normal duodenum.  Path with chronic inactive gastritis, no H. pylori or intestinal metaplasia.    ESOPHAGOGASTRODUODENOSCOPY (EGD) WITH PROPOFOL N/A 10/17/2018   Dr. Gala Romney: mild reflux esophagitis, small hiatal hernia   POLYPECTOMY  10/05/2019   Procedure: POLYPECTOMY;  Surgeon: Daneil Dolin, MD;  [facility]: [facility];  Service: Endoscopy;;   SVT ABLATION N/A 10/31/2021   Procedure: SVT ABLATION;  Surgeon: Melida Quitter, MD;  Location: [facility];  Service: Cardiovascular;  Laterality: N/A;   VOCAL CORD LATERALIZATION, ENDOSCOPIC APPROACH W/ MLB      Current Medications: No outpatient  medications have been marked as taking for the 12/02/21 encounter (Appointment) with Jennifer [name], Jennifer Barre, MD.     Allergies:   Codeine, Penicillins, Divalproex sodium, Meloxicam, Sonata [zaleplon], Topamax [topiramate], Aciphex [rabeprazole], Aimovig [erenumab-aooe], and Galcanezumab-gnlm   Social History   Socioeconomic History   Marital status: Married    Spouse name: Todd   Number of children: 0   Years of education: Not on file   Highest education level: Not on file  Occupational History   Not on file   Tobacco Use   Smoking status: Former    Packs/day: 0.50    Years: 20.00    Total pack years: 10.00    Types: Cigarettes    Start date: 01/20/1988    Quit date: 09/10/2021    Years since quitting: 0.2   Smokeless tobacco: Never  Vaping Use   Vaping Use: Never used  Substance and Sexual Activity   Alcohol use: No    Alcohol/week: 0.0 standard drinks of alcohol   Drug use: No   Sexual activity: Yes    Birth control/protection: None  Other Topics Concern   Not on file  Social History Narrative   Lives with husband -[name] of 81 years       [place]ie- [place]      Enjoys: reading-all genres      Diet: eats all food groups   Caffeine: 1 cup daily at times, diet dr pepper daily   Water: gatorade  zero and water       Wears seat belt    Does not use phone while driving   Smoke and Development worker, 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         Social Determinants of Health   Financial Resource Strain: Low Risk  (10/16/2021)   Overall Financial Resource Strain (CARDIA)    Difficulty of Paying Living Expenses: Not hard at all  Food Insecurity: No Food Insecurity (10/16/2021)   Hunger Vital Sign    Worried About Running Out of Food in the Last Year: Never true    Ran Out of Food in the Last Year: Never true  Transportation Needs: No Transportation Needs (10/16/2021)   PRAPARE - Hydrologist (Medical): No    Lack of Transportation (Non-Medical): No  Physical Activity: Inactive (10/16/2021)   Exercise Vital Sign    Days of Exercise per Week: 0 days    Minutes of Exercise per Session: 0 min  Stress: Stress Concern Present (10/16/2021)   [place]    Feeling of Stress : Very much  Social Connections: Socially Integrated (10/16/2021)   Social Connection and Isolation Panel [NHANES]    Frequency of Communication with Friends and Family: More than three times a week    Frequency of  Social Gatherings with Friends and Family: Once a week    Attends Religious Services: More than 4 times per year    Active Member of [employer] or Organizations: Yes    Attends Music therapist: More than 4 times per year    Marital Status: Married     Family History: The patient's family history includes Healthy in her father and mother; Migraines in her paternal grandmother. There is no history of Colon cancer or Colon polyps.  ROS:   Please see the history of present illness.     All other systems reviewed and are negative.  EKGs/Labs/Other Studies Reviewed:  The following studies were reviewed today:  TTE 09/11/2021  1. Left ventricular ejection fraction, by estimation, is 65 to 70%. The left ventricle has normal function. The left ventricle has no regional wall motion abnormalities. Left ventricular diastolic parameters were normal.   2. Right ventricular systolic function is normal. The right ventricular size is normal. There is normal pulmonary artery systolic pressure.   3. Left atrial size was mildly dilated.   4. The mitral valve is normal in structure. No evidence of mitral valve regurgitation. No evidence of mitral stenosis.   5. The aortic valve has an indeterminant number of cusps. Aortic valve regurgitation is not visualized. No aortic stenosis is present.   6. The inferior vena cava is normal in size with greater than 50%  respiratory variability, suggesting right atrial pressure of 3 mmHg.   Event Monitor 04/29/2014: Normal rhythm and rate distribution  EKG:  EKG is ordered today.      Orders placed or performed during the hospital encounter of 10/31/21   EKG 12-Lead   EKG 12-Lead     Recent Labs: 08/01/2021: ALT 23 09/10/2021: Magnesium 2.2; TSH 1.335 10/20/2021: BUN 15; Creatinine, Ser 0.92; Hemoglobin 14.2; Platelets 198; Potassium 4.5; Sodium 140     Physical Exam:    VS:  LMP 10/07/2011     Wt Readings from Last 3 Encounters:  11/12/21 180  lb 9.6 oz (81.9 kg)  10/31/21 180 lb (81.6 kg)  10/20/21 180 lb (81.6 kg)     GEN:  Well nourished, well developed in no acute distress CARDIAC: RRR, no murmurs, rubs, gallops RESPIRATORY:  Normal work of breathing MUSCULOSKELETAL: no edema    ASSESSMENT & PLAN:    In order of problems listed above:  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 She will follow-up again in one year. If she remains asymptomatic at that time, she may be dismissed from clinic. Ok to discontinue metoprolol.       Medication Adjustments/Labs and Tests Ordered: Current medicines are reviewed at length with the patient today.  Concerns regarding medicines are outlined above.  No orders of the defined types were placed in this encounter.  No orders of the defined types were placed in this encounter.    Signed, Melida Quitter, MD  12/02/2021 9:00 AM    [place]

## 2021-12-02 NOTE — Progress Notes (Signed)
Lung-RADS 2, benign appearance or behavior. Continue annual screening. Small hiatal hernia, report abdominal pain.  Aortic Atherosclerosis and Emphysema. Smoking cessation is encouraged

## 2021-12-03 ENCOUNTER — Telehealth: Payer: Self-pay | Admitting: Internal Medicine

## 2021-12-03 NOTE — Telephone Encounter (Signed)
Patient returning call imaging results. Please call after 11:30 am.

## 2021-12-03 NOTE — Telephone Encounter (Signed)
Results discussed with Jennifer [name] see imaging result note.

## 2021-12-04 ENCOUNTER — Telehealth: Payer: Self-pay | Admitting: Physician Assistant

## 2021-12-04 ENCOUNTER — Telehealth: Payer: Self-pay | Admitting: Internal Medicine

## 2021-12-04 NOTE — Telephone Encounter (Signed)
Returned patient call.

## 2021-12-04 NOTE — Telephone Encounter (Signed)
Pt informed

## 2021-12-04 NOTE — Telephone Encounter (Signed)
Pt called stating she has some questions in regards to her imaging results and would like some one to call her & answer her questions?

## 2021-12-04 NOTE — Telephone Encounter (Signed)
Pt LVM on 11/15@ 4:21p.  She was crying and said she is "feeling real depressed".    Next appt 11/28

## 2021-12-04 NOTE — Telephone Encounter (Signed)
She has only been on the higher dose of Luvox for 9 days, that has not enough time to gauge the efficacy.  Have her continue the same dose, it sounds like there is circumstantial depression going on as well.  Have her contact her therapist to discuss and get further tools to help deal with the depression while we are waiting for the higher dose of Luvox to take effect.

## 2021-12-04 NOTE — Telephone Encounter (Signed)
Pt stated she stayed up all night crying.She feels like a disappointment to others and she is not happy,She does not think the fluvoxamine is helpful anymore because nothing is working for her depression.Please advise

## 2021-12-08 DIAGNOSIS — F319 Bipolar disorder, unspecified: Secondary | ICD-10-CM | POA: Diagnosis not present

## 2021-12-08 DIAGNOSIS — F502 Bulimia nervosa: Secondary | ICD-10-CM | POA: Diagnosis not present

## 2021-12-08 DIAGNOSIS — F429 Obsessive-compulsive disorder, unspecified: Secondary | ICD-10-CM | POA: Diagnosis not present

## 2021-12-08 DIAGNOSIS — F4310 Post-traumatic stress disorder, unspecified: Secondary | ICD-10-CM | POA: Diagnosis not present

## 2021-12-09 ENCOUNTER — Telehealth: Payer: Medicare PPO | Admitting: Physician Assistant

## 2021-12-10 ENCOUNTER — Ambulatory Visit: Payer: Medicare PPO | Admitting: Podiatry

## 2021-12-10 DIAGNOSIS — Z01818 Encounter for other preprocedural examination: Secondary | ICD-10-CM | POA: Diagnosis not present

## 2021-12-10 DIAGNOSIS — L600 Ingrowing nail: Secondary | ICD-10-CM

## 2021-12-10 NOTE — Progress Notes (Signed)
Subjective:  Patient ID: Jennifer [name], female    DOB: May 20, 1965,  MRN: [id number]  Chief Complaint  Patient presents with   Ingrown Toenail    Left foot hallux ingrown toenail, started 3 months ago, patient denies any pain    56 y.o. female presents with the above complaint.  Patient presents with multiple recurrence of bilateral medial border ingrown.  Patient states is coming back and is causing more pain.  She would like to discuss surgical excision of this.  Review of Systems: Negative except as noted in the HPI. Denies N/V/F/Ch.  Past Medical History:  Diagnosis Date   Abdominal pain, epigastric 10/07/2018   Acute non-recurrent maxillary sinusitis 11/07/2019   Anxiety    Arthritis    Binge-eating and purging type anorexia nervosa    Bipolar affective disorder ([place]) 12/04/2015   Bipolar affective disorder, current episode manic without psychotic symptoms ([place]) 12/04/2015   Bipolar disorder ([place])    Cellulitis and abscess of buttock 02/15/2013   Cystitis, interstitial    Depression    Depression    Phreesia 01/28/2020   Esophageal polyp    about 20 years ago   GERD (gastroesophageal reflux disease)    Left wrist pain 03/22/2019   OCD (obsessive compulsive disorder)    Palpitations 05/25/2014   Rectal pain 12/06/2018   Rib pain on right side 01/31/2019   Rosacea 03/27/2019   Sprain of temporomandibular joint or ligament 01/10/2019    Current Outpatient Medications:    Brexpiprazole ([name]) 4 MG TABS, Take 4 mg by mouth in the morning., Disp: 30 tablet, Rfl: 11   clonazePAM (KLONOPIN) 0.5 MG tablet, Take 1 tablet (0.5 mg total) by mouth 3 (three) times daily as needed for anxiety. (Patient taking differently: Take 0.25 mg by mouth 3 (three) times daily as needed for anxiety.), Disp: 30 tablet, Rfl: 5   colestipol (COLESTID) 1 g tablet, Take 4 tablets orally two to three times daily for diarrhea. (Patient taking differently: Take 4 g by mouth 2 (two) times daily.),  Disp: 300 tablet, Rfl: 1   cromolyn (OPTICROM) 4 % ophthalmic solution, Place 1 drop into both eyes daily., Disp: , Rfl:    diclofenac Sodium (VOLTAREN) 1 % GEL, Apply 4 g topically 4 (four) times daily. (Patient taking differently: Apply 4 g topically 4 (four) times daily as needed (pain).), Disp: 50 g, Rfl: 0   dicyclomine (BENTYL) 20 MG tablet, Take 1 tablet (20 mg total) by mouth 3 (three) times daily as needed for spasms (Diarrhea)., Disp: 30 tablet, Rfl: 1   Famotidine-Ca Carb-Mag Hydrox (PEPCID COMPLETE PO), Take 1 tablet by mouth daily., Disp: , Rfl:    fluvoxaMINE (LUVOX) 100 MG tablet, Take 3 tablets (300 mg total) by mouth at bedtime., Disp: 270 tablet, Rfl: 3   gabapentin (NEURONTIN) 600 MG tablet, 2 p.o. every morning, 1 p.o. q. afternoon, 2 p.o. nightly. (Patient taking differently: Take 600-1,200 mg by mouth See admin instructions. 2 p.o. every morning, 1 p.o. q. afternoon, 2 p.o. nightly.), Disp: 150 tablet, Rfl: 5   hydrOXYzine (ATARAX) 25 MG tablet, Take 1-2 tablets (25-50 mg total) by mouth every 8 (eight) hours as needed., Disp: 90 tablet, Rfl: 11   Lactobacillus-Inulin (CULTURELLE DIGESTIVE DAILY PO), Take 1 capsule by mouth daily. (Patient not taking: Reported on 11/25/2021), Disp: , Rfl:    loratadine (CLARITIN) 5 MG chewable tablet, Chew 5 mg by mouth in the morning and at bedtime., Disp: , Rfl:    mirtazapine (REMERON) 15  MG tablet, Take 1 tablet (15 mg total) by mouth at bedtime., Disp: 90 tablet, Rfl: 3   naproxen (NAPROSYN) 500 MG tablet, Take 1,500 mg by mouth as needed for migraine., Disp: , Rfl:    ondansetron (ZOFRAN-ODT) 4 MG disintegrating tablet, Take 1 tablet (4 mg total) by mouth every 8 (eight) hours as needed for nausea or vomiting., Disp: 15 tablet, Rfl: 0   pramipexole (MIRAPEX) 0.5 MG tablet, Take 1 tablet (0.5 mg total) by mouth at bedtime as needed., Disp: 90 tablet, Rfl: 3   prednisoLONE acetate (PRED FORTE) 1 % ophthalmic suspension, Place 1 drop into both  eyes daily., Disp: , Rfl:    RESTASIS 0.05 % ophthalmic emulsion, Place 1 drop into both eyes 2 (two) times daily., Disp: , Rfl:    Rimegepant Sulfate (NURTEC) 75 MG TBDP, Take 75 mg by mouth daily as needed (take for abortive therapy of migraine, no more than 1 tablet in 24 hours or 8 per month)., Disp: 8 tablet, Rfl: 11   triamcinolone cream (KENALOG) 0.1 %, Apply 1 application. topically 2 (two) times daily. (Patient not taking: Reported on 11/25/2021), Disp: 30 g, Rfl: 0   valACYclovir (VALTREX) 500 MG tablet, Take 500 mg by mouth daily., Disp: , Rfl:   Social History   Tobacco Use  Smoking Status Former   Packs/day: 0.50   Years: 20.00   Total pack years: 10.00   Types: Cigarettes   Start date: 01/20/1988   Quit date: 09/10/2021   Years since quitting: 0.2  Smokeless Tobacco Never    Allergies  Allergen Reactions   Codeine Shortness Of Breath, Nausea Only and Rash   Penicillins Hives, Shortness Of Breath, Swelling and Rash    Has patient had a PCN reaction causing immediate rash, facial/tongue/throat swelling, SOB or lightheadedness with hypotension: yes Has patient had a PCN reaction causing severe rash involving mucus membranes or skin necrosis: no Has patient had a PCN reaction that required hospitalization: no Has patient had a PCN reaction occurring within the last 10 years: no If all of the above answers are "NO", then may proceed with Cephalosporin use.;      Divalproex Sodium Hives, Itching and Rash   Meloxicam Other (See Comments)    Possible chest tightness - instructed by MD not to take   Sonata [Zaleplon] Other (See Comments)    Hallucinations   Topamax [Topiramate] Other (See Comments)    Low BP and dizziness   Aciphex [Rabeprazole] Swelling   Aimovig [Erenumab-Aooe] Itching   Galcanezumab-Gnlm Rash and Hives   Objective:  There were no vitals filed for this visit. There is no height or weight on file to calculate BMI. Constitutional Well developed. Well  nourished.  Vascular Dorsalis pedis pulses palpable bilaterally. Posterior tibial pulses palpable bilaterally. Capillary refill normal to all digits.  No cyanosis or clubbing noted. Pedal hair growth normal.  Neurologic Normal speech. Oriented to person, place, and time. Epicritic sensation to light touch grossly present bilaterally.  Dermatologic Painful ingrowing nail at medial nail borders of the hallux nail bilateral No other open wounds. No skin lesions.  Orthopedic: Normal joint ROM without pain or crepitus bilaterally. No visible deformities. No bony tenderness.   Radiographs: None Assessment:   1. Ingrown left big toenail   2. Ingrown toenail of right foot   3. Encounter for preoperative examination for general surgical procedure    Plan:  Patient was evaluated and treated and all questions answered.  Ingrown Nail, bilateral medial border -  Patient presents with bilateral hallux medial border ingrown.  Patient's had multiple phenol matricectomy performed without any relief.  Patient states the ingrown keeps growing back.  She would like to discuss more definitive treatment options I discussed my preoperative intraoperative postop plan in extensive detail given that she has failed phenol matricectomy she will benefit from [facility] surgical excision of bilateral medial border hallux ingrown. -Informed surgical risk consent was reviewed and read aloud to the patient.  I reviewed the films.  I have discussed my findings with the patient in great detail.  I have discussed all risks including but not limited to infection, stiffness, scarring, limp, disability, deformity, damage to blood vessels and nerves, numbness, poor healing, need for braces, arthritis, chronic pain, amputation, death.  All benefits and realistic expectations discussed in great detail.  I have made no promises as to the outcome.  I have provided realistic expectations.  I have offered the patient a 2nd opinion, which  they have declined and assured me they preferred to proceed despite the risks   No follow-ups on file.

## 2021-12-15 ENCOUNTER — Telehealth: Payer: Self-pay | Admitting: Podiatry

## 2021-12-15 DIAGNOSIS — H10013 Acute follicular conjunctivitis, bilateral: Secondary | ICD-10-CM | POA: Diagnosis not present

## 2021-12-15 NOTE — Telephone Encounter (Signed)
DOS: 01/05/2022  [employer] Effective 01/20/2019  Excision Nail Permanent Hallux B/L (11750)  DX: L60.0  Deductible: $0 Out-of-Pocket: $3,300 with $2,565 remaining CoInsurance: 0%  Prior authorization is not required per [employer] and [facility] websites.

## 2021-12-16 ENCOUNTER — Encounter: Payer: Self-pay | Admitting: Physician Assistant

## 2021-12-16 ENCOUNTER — Telehealth (INDEPENDENT_AMBULATORY_CARE_PROVIDER_SITE_OTHER): Payer: Medicare PPO | Admitting: Physician Assistant

## 2021-12-16 DIAGNOSIS — F99 Mental disorder, not otherwise specified: Secondary | ICD-10-CM

## 2021-12-16 DIAGNOSIS — F3130 Bipolar disorder, current episode depressed, mild or moderate severity, unspecified: Secondary | ICD-10-CM | POA: Diagnosis not present

## 2021-12-16 DIAGNOSIS — F411 Generalized anxiety disorder: Secondary | ICD-10-CM

## 2021-12-16 DIAGNOSIS — G4700 Insomnia, unspecified: Secondary | ICD-10-CM

## 2021-12-16 DIAGNOSIS — F502 Bulimia nervosa: Secondary | ICD-10-CM | POA: Diagnosis not present

## 2021-12-16 DIAGNOSIS — F429 Obsessive-compulsive disorder, unspecified: Secondary | ICD-10-CM | POA: Diagnosis not present

## 2021-12-16 DIAGNOSIS — F5105 Insomnia due to other mental disorder: Secondary | ICD-10-CM

## 2021-12-16 MED ORDER — FLUVOXAMINE MALEATE 100 MG PO TABS: 400.0000 mg | ORAL_TABLET | Freq: Every day | ORAL | 1 refills | Status: DC

## 2021-12-16 MED ORDER — HYDROXYZINE HCL 25 MG PO TABS: 25.0000 mg | ORAL_TABLET | Freq: Three times a day (TID) | ORAL | 11 refills | Status: DC | PRN

## 2021-12-16 MED ORDER — REXULTI 4 MG PO TABS: 4.0000 mg | ORAL_TABLET | Freq: Every morning | ORAL | 11 refills | Status: DC
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
## 2021-12-17 ENCOUNTER — Encounter: Payer: Self-pay | Admitting: Internal Medicine

## 2021-12-17 ENCOUNTER — Ambulatory Visit: Payer: Medicare PPO | Admitting: Internal Medicine

## 2021-12-17 VITALS — BP 130/81 | HR 85 | Ht 64.0 in | Wt 181.4 lb

## 2021-12-17 DIAGNOSIS — F429 Obsessive-compulsive disorder, unspecified: Secondary | ICD-10-CM | POA: Diagnosis not present

## 2021-12-17 DIAGNOSIS — Z720 Tobacco use: Secondary | ICD-10-CM

## 2021-12-17 DIAGNOSIS — R062 Wheezing: Secondary | ICD-10-CM | POA: Diagnosis not present

## 2021-12-17 DIAGNOSIS — F319 Bipolar disorder, unspecified: Secondary | ICD-10-CM | POA: Diagnosis not present

## 2021-12-17 DIAGNOSIS — F502 Bulimia nervosa: Secondary | ICD-10-CM | POA: Diagnosis not present

## 2021-12-17 DIAGNOSIS — F4310 Post-traumatic stress disorder, unspecified: Secondary | ICD-10-CM | POA: Diagnosis not present

## 2021-12-17 DIAGNOSIS — J432 Centrilobular emphysema: Secondary | ICD-10-CM | POA: Diagnosis not present

## 2021-12-17 HISTORY — DX: Tobacco use: Z72.0

## 2021-12-17 MED ORDER — ALBUTEROL SULFATE HFA 108 (90 BASE) MCG/ACT IN AERS: 2.0000 | INHALATION_SPRAY | Freq: Four times a day (QID) | RESPIRATORY_TRACT | 5 refills | Status: DC | PRN
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
## 2021-12-17 NOTE — Assessment & Plan Note (Addendum)
Smoked about 1 pack/day, recently quit.  Asked about quitting: confirms that he/she smoked cigarettes Advise to quit smoking: Educated about QUITTING to reduce the risk of cancer, cardio and cerebrovascular disease. Assess willingness: Willing to quit at this time Assist with counseling and pharmacotherapy: Counseled for 5 minutes and literature provided. Unable to take Chantix due to current psychiatric conditions, already has nightmares. Arrange for follow up: follow up in 3 months and continue to offer help.

## 2021-12-17 NOTE — Assessment & Plan Note (Signed)
Mild wheezing, especially in the mornings Lung sounds benign today Started albuterol inhaler as needed for dyspnea or wheezing Needs to abstain from smoking, she agrees.

## 2021-12-17 NOTE — Patient Instructions (Signed)
Please use Albuterol inhaler as needed for shortness of breath or wheezing.  Please try to abstain from smoking. Okay to use Nicotine gums to help with craving.

## 2021-12-21 NOTE — Progress Notes (Unsigned)
GI Office Note    Referring Provider: Lindell Spar, MD Primary Care Physician:  Lindell Spar, MD Primary Gastroenterologist: [name].Rourk, MD  Date:  12/22/2021  ID:  Jennifer [name], DOB 07-Sep-1965, MRN [id number]   Chief Complaint   Chief Complaint  Patient presents with   Follow-up    Having dry heaves. Needs refill on medications.     History of Present Illness  Jennifer [name] is a 56 y.o. female with a history of anxiety, GERD, bulimia, fatty liver, depression, bipolar 1 arthritis, and diarrhea presenting today with complaint of dry heaves/nausea.  Last colonoscopy September 2021: -2 sigmoid polyps -Advised repeat in 10 years  Clinic visit in April 2023 with complaint of diarrhea.  Having 3-4 loose stools per day, small to moderate amounts.  Also with incomplete defecation and occasional urgency.  Denies nocturnal stools.  Abdominal pain relieved with defecation.  Pain primarily periumbilical region and lower abdomen.  Diarrhea made worse with eating.  Had good relief with over-the-counter Imodium.  Negative C. difficile, Giardia.  GI pathogen panel was positive for norovirus.  PCP visit in July again noting chronic diarrhea.  Had some improvement with Bentyl fiber, and probiotic.  Also reported chronic nausea due to bulimia.  Started on cholestyramine 4 g twice daily.  Last visit 10/01/2021, virtual follow-up.  Imodium remained helpful with diarrhea however reported more abdominal pain.  Cholestyramine also somewhat helpful however difficult with twice daily dosing as she reported all contents do not seem to resolve.  Still having urgency with varying bowel habits.  Sometimes 8-9 stools per day and varying amounts.  Denied nausea, melena, BRBPR, fever, or chills.  Reports scattered bulimic episodes.  Advised Colestid 4 g 2-3 times daily.  Advised to continue Levsin for abdominal pain.  Use Imodium prior to meals if persistent symptoms.  Advised to keep a food and symptom  log.  Potential to try Xifaxan in the future if symptoms continue.  She underwent cardiac ablation in October given symptomatic SVT.  Ablation was successful and her metoprolol was discontinued.  She was advised to follow-up in 1 year.    Today: Has a migraine today. Has been having some nausea. Psychologist gave her some zofran to help with nausea. Still dealing with her bulemia, seeing psychiatry and therapy. Very nauseas today with her migraine. Using zofran 2 a week. Admits to binging and purging in the middle of the night. No retching in the middle of the day unless she has a migraine.   Dry heaves - occurring most mornings of the week, lasting 10-15 minutes and then improves if she lays back down for a few minutes. Today the lights are bothering her migraines. Took her medication this morning. Sometimes does have some vomiting that is yellow. Reports now she is on rexulti. Able to eat okay once the nausea is gone. Usually has crackers or oatmeal in the morning.   Still has occasional abdominal pain with diarrhea., Colestid does work well for her. Usually taking it once per day, if she takes it twice it is not as easy for her to go.   Reports if she does not purge then she does not bloat.   Current Outpatient Medications  Medication Sig Dispense Refill   albuterol (VENTOLIN HFA) 108 (90 Base) MCG/ACT inhaler Inhale 2 puffs into the lungs every 6 (six) hours as needed for wheezing or shortness of breath. 18 g 5   Brexpiprazole ([name]) 4 MG TABS Take 4 mg  by mouth in the morning. 30 tablet 11   cromolyn (OPTICROM) 4 % ophthalmic solution Place 1 drop into both eyes daily.     dicyclomine (BENTYL) 20 MG tablet Take 1 tablet (20 mg total) by mouth 3 (three) times daily as needed for spasms (Diarrhea). 30 tablet 1   Famotidine-Ca Carb-Mag Hydrox (PEPCID COMPLETE PO) Take 1 tablet by mouth daily.     fluvoxaMINE (LUVOX) 100 MG tablet Take 4 tablets (400 mg total) by mouth at bedtime. 360 tablet  1   gabapentin (NEURONTIN) 600 MG tablet 2 p.o. every morning, 1 p.o. q. afternoon, 2 p.o. nightly. (Patient taking differently: Take 600-1,200 mg by mouth See admin instructions. 2 p.o. every morning, 1 p.o. q. afternoon, 2 p.o. nightly.) 150 tablet 5   hydrOXYzine (ATARAX) 25 MG tablet Take 1-2 tablets (25-50 mg total) by mouth every 8 (eight) hours as needed. 90 tablet 11   loratadine (CLARITIN) 5 MG chewable tablet Chew 5 mg by mouth in the morning and at bedtime.     mirtazapine (REMERON) 15 MG tablet Take 1 tablet (15 mg total) by mouth at bedtime. 90 tablet 3   naproxen (NAPROSYN) 500 MG tablet Take 1,500 mg by mouth as needed for migraine.     pramipexole (MIRAPEX) 0.5 MG tablet Take 1 tablet (0.5 mg total) by mouth at bedtime as needed. 90 tablet 3   prednisoLONE acetate (PRED FORTE) 1 % ophthalmic suspension Place 1 drop into both eyes daily.     RESTASIS 0.05 % ophthalmic emulsion Place 1 drop into both eyes 2 (two) times daily.     Rimegepant Sulfate (NURTEC) 75 MG TBDP Take 75 mg by mouth daily as needed (take for abortive therapy of migraine, no more than 1 tablet in 24 hours or 8 per month). 8 tablet 11   valACYclovir (VALTREX) 500 MG tablet Take 500 mg by mouth daily.     colestipol (COLESTID) 1 g tablet Take 4 tablets (4 g total) by mouth 2 (two) times daily. 720 tablet 1   ondansetron (ZOFRAN-ODT) 4 MG disintegrating tablet Take 1 tablet (4 mg total) by mouth every 8 (eight) hours as needed for nausea or vomiting. 30 tablet 0   No current facility-administered medications for this visit.    Past Medical History:  Diagnosis Date   Abdominal pain, epigastric 10/07/2018   Acute non-recurrent maxillary sinusitis 11/07/2019   Anxiety    Arthritis    Binge-eating and purging type anorexia nervosa    Bipolar affective disorder ([place]) 12/04/2015   Bipolar affective disorder, current episode manic without psychotic symptoms ([place]) 12/04/2015   Bipolar disorder ([place])    Cellulitis  and abscess of buttock 02/15/2013   Cystitis, interstitial    Depression    Depression    Phreesia 01/28/2020   Esophageal polyp    about 20 years ago   GERD (gastroesophageal reflux disease)    Left wrist pain 03/22/2019   OCD (obsessive compulsive disorder)    Palpitations 05/25/2014   Rectal pain 12/06/2018   Rib pain on right side 01/31/2019   Rosacea 03/27/2019   Sprain of temporomandibular joint or ligament 01/10/2019    Past Surgical History:  Procedure Laterality Date   BLADDER SURGERY     CHOLECYSTECTOMY     COLONOSCOPY WITH PROPOFOL N/A 10/05/2019   Procedure: COLONOSCOPY WITH PROPOFOL;  Surgeon: Daneil Dolin, MD;  [facility];  Service: Endoscopy;  Laterality: N/A;  12:45pm   COSMETIC SURGERY N/A  Phreesia 01/28/2020   ESOPHAGOGASTRODUODENOSCOPY  09/28/2016   [facility]; Dr. Therisa Doyne; erosions in the esophagus, 5 cm hiatal hernia, nonbleeding erosive gastropathy s/p biopsied, normal duodenum.  Path with chronic inactive gastritis, no H. pylori or intestinal metaplasia.    ESOPHAGOGASTRODUODENOSCOPY (EGD) WITH PROPOFOL N/A 10/17/2018   Dr. Gala Romney: mild reflux esophagitis, small hiatal hernia   POLYPECTOMY  10/05/2019   Procedure: POLYPECTOMY;  Surgeon: Daneil Dolin, MD;  [facility];  Service: Endoscopy;;   SVT ABLATION N/A 10/31/2021   Procedure: SVT ABLATION;  Surgeon: Melida Quitter, MD;  Location: [facility];  Service: Cardiovascular;  Laterality: N/A;   VOCAL CORD LATERALIZATION, ENDOSCOPIC APPROACH W/ MLB      Family History  Problem Relation Age of Onset   Healthy Mother    Healthy Father    Migraines Paternal Grandmother    Colon cancer Neg Hx    Colon polyps Neg Hx     Allergies as of 12/22/2021 - Review Complete 12/22/2021  Allergen Reaction Noted   Codeine Shortness Of Breath, Nausea Only, and Rash 02/15/2013   Penicillins Hives, Shortness Of Breath, Swelling, and Rash 10/09/2011   Divalproex sodium Hives,  Itching, and Rash 01/28/2020   Meloxicam Other (See Comments) 05/08/2014   Sonata [zaleplon] Other (See Comments) 10/22/2017   Topamax [topiramate] Other (See Comments) 10/04/2018   Aciphex [rabeprazole] Swelling 07/16/2020   Aimovig [erenumab-aooe] Itching 06/26/2019   Galcanezumab-gnlm Rash and Hives 10/04/2018    Social History   Socioeconomic History   Marital status: Married    Spouse name: Multimedia programmer   Number of children: 0   Years of education: Not on file   Highest education level: Not on file  Occupational History   Not on file  Tobacco Use   Smoking status: Former    Packs/day: 0.50    Years: 20.00    Total pack years: 10.00    Types: Cigarettes    Start date: 01/20/1988    Quit date: 09/03/2021    Years since quitting: 0.3   Smokeless tobacco: Never  Vaping Use   Vaping Use: Never used  Substance and Sexual Activity   Alcohol use: No    Alcohol/week: 0.0 standard drinks of alcohol   Drug use: No   Sexual activity: Yes    Birth control/protection: None  Other Topics Concern   Not on file  Social History Narrative   Lives with husband -[name] of 72 years       Yorkie- Max      Enjoys: reading-all genres      Diet: eats all food groups   Caffeine: 1 cup daily at times, diet dr pepper daily   Water: gatorade  zero and water       Wears seat belt    Does not use phone while driving   Smoke and Development worker, 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         Social Determinants of Health   Financial Resource Strain: Low Risk  (10/16/2021)   Overall Financial Resource Strain (CARDIA)    Difficulty of Paying Living Expenses: Not hard at all  Food Insecurity: No Food Insecurity (10/16/2021)   Hunger Vital Sign    Worried About Running Out of Food in the Last Year: Never true    Ran Out of Food in the Last Year: Never true  Transportation Needs: No Transportation Needs (10/16/2021)   PRAPARE - Transportation    Lack  of Transportation (Medical): No     Lack of Transportation (Non-Medical): No  Physical Activity: Inactive (10/16/2021)   Exercise Vital Sign    Days of Exercise per Week: 0 days    Minutes of Exercise per Session: 0 min  Stress: Stress Concern Present (10/16/2021)   [place]    Feeling of Stress : Very much  Social Connections: Socially Integrated (10/16/2021)   Social Connection and Isolation Panel [NHANES]    Frequency of Communication with Friends and Family: More than three times a week    Frequency of Social Gatherings with Friends and Family: Once a week    Attends Religious Services: More than 4 times per year    Active Member of [employer] or Organizations: Yes    Attends Music therapist: More than 4 times per year    Marital Status: Married     Review of Systems   Gen: Denies fever, chills, anorexia. Denies fatigue, weakness, weight loss.  CV: Denies chest pain, palpitations, syncope, peripheral edema, and claudication. Resp: Denies dyspnea at rest, cough, wheezing, coughing up blood, and pleurisy. GI: See HPI Derm: Denies rash, itching, dry skin Psych: Denies depression, anxiety, memory loss, confusion. No homicidal or suicidal ideation.  Heme: Denies bruising, bleeding, and enlarged lymph nodes.   Physical Exam   BP 128/82 (BP Location: Right Arm, Patient Position: Sitting, Cuff Size: Normal)   Pulse 63   Temp 97.6 F (36.4 C) (Temporal)   Ht '5\' 4"'$  (1.626 m)   Wt 183 lb (83 kg)   LMP 10/07/2011   SpO2 94%   BMI 31.41 kg/m   General:   Alert and oriented. No distress noted. Pleasant and cooperative.  Head:  Normocephalic and atraumatic. Eyes:  Conjuctiva clear without scleral icterus. Mouth:  Oral mucosa pink and moist. Good dentition. No lesions. Lungs:  Clear to auscultation bilaterally. No wheezes, rales, or rhonchi. No distress.  Heart:  S1, S2 present without murmurs appreciated.  Abdomen:  +BS, soft, non-distended.  Mild ttp to epigastrium and LLQ. No rebound or guarding. No HSM or masses noted. Rectal: deferred Msk:  Symmetrical without gross deformities. Normal posture. Extremities:  Without edema. Neurologic:  Alert and  oriented x4 Psych:  Alert and cooperative. Normal mood and affect.   Assessment  Jennifer [name] is a 56 y.o. female with a history of anxiety, GERD, bulimia, fatty liver, depression, bipolar 1 arthritis, and diarrhea presenting today with complaint of dry heaves/nausea.   Nausea, dry heaving: Has been having occasional nausea with vomiting and dry heaves in the morning.  Given her history of bulimia with binging and purging late at night, suspect this is largely the contributing factor to her dry heaves in the morning.  She has been following with psychiatry and has been receiving therapy as well as brain mapping and efforts to control her bulimia.  Dry heaves usually last 10 to 15 minutes at a time and that she will have improvement of symptoms.  Today she is experiencing a migraine which is exacerbating her nausea.  She still has a good appetite and is able to eat without issue.  Weight has been stable.  Patient also recently quit smoking about 1 week ago, possibly be having some nicotine withdrawal contributing to her symptoms. Was recently given short-term Zofran course by psychiatry.  Will give refill today to keep on hand for recurrent symptoms. Advised to continue therapies for bulimia. Will hold off on any  endoscopic procedure at this time, can consider if symptoms worsen.   GERD: Well-controlled on famotidine daily.  Denies frequent breakthrough symptoms.  Does have nausea and dry heaving as stated above but likely not related to reflux.   Chronic diarrhea: IBS component present, also with history of cholecystectomy.  Stool studies in April positive for norovirus, repeat studies in July negative.  At last visit she was given Colestid 4 g twice daily.  Still has occasional abdominal  cramping for which she uses dicyclomine for sparingly.  Has had decent relief with Imodium in the past.  Has occasional bloating symptoms as well, this could be related to GERD as well as her frequent nausea and vomiting.  Symptoms not adequately controlled with cholestyramine powder in the past.  Overall symptoms have been improved with Colestid which she is primarily taking once daily, stating it is harder for her to go with twice daily dosing.  PLAN   Dicyclomine 20 mg as needed, call for refill when needed.  Zofran 4 mg every 8 hours as needed, refilled today. Colestid 4g once to twice daily for diarrhea, refilled today. Continue famotidine daily, may increase to twice daily if needed. Continue to work with therapy regarding bulemia  Continue to avoid lactose products Follow up in 4-5 months or sooner if needed.    [name], MSN, FNP-BC, AGACNP-BC [facility]

## 2021-12-22 ENCOUNTER — Encounter: Payer: Self-pay | Admitting: Gastroenterology

## 2021-12-22 ENCOUNTER — Ambulatory Visit: Payer: Medicare PPO | Admitting: Gastroenterology

## 2021-12-22 VITALS — BP 128/82 | HR 63 | Temp 97.6°F | Ht 64.0 in | Wt 183.0 lb

## 2021-12-22 DIAGNOSIS — F502 Bulimia nervosa: Secondary | ICD-10-CM | POA: Diagnosis not present

## 2021-12-22 DIAGNOSIS — R1110 Vomiting, unspecified: Secondary | ICD-10-CM | POA: Diagnosis not present

## 2021-12-22 DIAGNOSIS — K529 Noninfective gastroenteritis and colitis, unspecified: Secondary | ICD-10-CM

## 2021-12-22 DIAGNOSIS — K219 Gastro-esophageal reflux disease without esophagitis: Secondary | ICD-10-CM | POA: Diagnosis not present

## 2021-12-22 DIAGNOSIS — R109 Unspecified abdominal pain: Secondary | ICD-10-CM

## 2021-12-22 DIAGNOSIS — R112 Nausea with vomiting, unspecified: Secondary | ICD-10-CM

## 2021-12-22 DIAGNOSIS — F319 Bipolar disorder, unspecified: Secondary | ICD-10-CM | POA: Diagnosis not present

## 2021-12-22 DIAGNOSIS — F429 Obsessive-compulsive disorder, unspecified: Secondary | ICD-10-CM | POA: Diagnosis not present

## 2021-12-22 DIAGNOSIS — F4310 Post-traumatic stress disorder, unspecified: Secondary | ICD-10-CM | POA: Diagnosis not present

## 2021-12-22 MED ORDER — COLESTIPOL HCL 1 G PO TABS: 4.0000 g | ORAL_TABLET | Freq: Two times a day (BID) | ORAL | 1 refills | Status: DC

## 2021-12-22 MED ORDER — ONDANSETRON 4 MG PO TBDP: 4.0000 mg | ORAL_TABLET | Freq: Three times a day (TID) | ORAL | 0 refills | Status: DC | PRN

## 2021-12-22 NOTE — Patient Instructions (Addendum)
As we discussed I think some of your dry heaves may be secondary to your bulimia as well as possible nicotine withdrawal.  Continue your efforts to remain tobacco free.  I have refilled her Zofran today for you to use as needed.  As long as you are able to eat and not having significant nausea, vomiting then I feel as though we do not need to proceed with any endoscopy at this time.  The Colestid 4 g once daily for diarrhea, may increase to twice daily if diarrhea worsens.  Continue Pepcid for your reflux, if needed you may increase to twice daily taking in the morning and in the evenings prior to bed.  This could possibly help you with your dry heaves as well.  I have refilled your dicyclomine today for you to use as needed for abdominal pain.  Continue to work with your neurologist and psychiatrist regarding bulimia treatment as I feel that this is the biggest contributing factor to your nausea and vomiting.  We will plan to follow-up in 4-5 months.  I hope you have a wonderful Holiday!  It was a pleasure to see you today. I want to create trusting relationships with patients. If you receive a survey regarding your visit,  I greatly appreciate you taking time to fill this out on paper or through your MyChart. I value your feedback.  [name], MSN, FNP-BC, AGACNP-BC [facility]

## 2021-12-23 ENCOUNTER — Telehealth: Payer: Medicare PPO | Admitting: Physician Assistant

## 2021-12-24 DIAGNOSIS — F4310 Post-traumatic stress disorder, unspecified: Secondary | ICD-10-CM | POA: Diagnosis not present

## 2021-12-24 DIAGNOSIS — F319 Bipolar disorder, unspecified: Secondary | ICD-10-CM | POA: Diagnosis not present

## 2021-12-24 DIAGNOSIS — F502 Bulimia nervosa: Secondary | ICD-10-CM | POA: Diagnosis not present

## 2021-12-24 DIAGNOSIS — F429 Obsessive-compulsive disorder, unspecified: Secondary | ICD-10-CM | POA: Diagnosis not present

## 2021-12-30 DIAGNOSIS — F429 Obsessive-compulsive disorder, unspecified: Secondary | ICD-10-CM | POA: Diagnosis not present

## 2021-12-30 DIAGNOSIS — F502 Bulimia nervosa: Secondary | ICD-10-CM | POA: Diagnosis not present

## 2021-12-30 DIAGNOSIS — F4310 Post-traumatic stress disorder, unspecified: Secondary | ICD-10-CM | POA: Diagnosis not present

## 2021-12-30 DIAGNOSIS — F319 Bipolar disorder, unspecified: Secondary | ICD-10-CM | POA: Diagnosis not present

## 2022-01-01 ENCOUNTER — Encounter: Payer: Self-pay | Admitting: Family Medicine

## 2022-01-01 ENCOUNTER — Ambulatory Visit (INDEPENDENT_AMBULATORY_CARE_PROVIDER_SITE_OTHER): Payer: Medicare PPO | Admitting: Family Medicine

## 2022-01-01 ENCOUNTER — Telehealth: Payer: Self-pay

## 2022-01-01 DIAGNOSIS — B349 Viral infection, unspecified: Secondary | ICD-10-CM | POA: Diagnosis not present

## 2022-01-01 NOTE — Telephone Encounter (Signed)
Jennifer [name] called to reschedule her surgery with Dr. Posey Pronto on 01/05/2022. She is sick. I have her rescheduled to 02/23/2022. Notified Dr. Posey Pronto and Caren Griffins with [facility].

## 2022-01-01 NOTE — Progress Notes (Signed)
Virtual Visit via Telephone Note   This visit type was conducted via telephone. This format is felt to be most appropriate for this patient at this time.  The patient did not have access to video technology/had technical difficulties with video requiring transitioning to audio format only (telephone).  All issues noted in this document were discussed and addressed.  No physical exam could be performed with this format.  Evaluation Performed:  Follow-up visit  Date:  01/01/2022   ID:  Jennifer, [name] 02-24-1965, MRN [id number]  Patient Location: Home Provider Location: Clinic  Participants: Patient Location of Patient: Home Location of Provider: Clinic Consent was obtain for visit to be over via telehealth. I verified that I am speaking with the correct person using two identifiers.  PCP:  Jennifer Spar, MD   Chief Complaint:    History of Present Illness:    Jennifer [name] is a 56 y.o. female with reports of coughing yellow mucus and sore throat.  She denies fever, and headaches.  She reports that her symptoms are worse in the morning and gets better in the afternoon.  She has been taking Mucinex for chest congestion with minor relief of her symptoms.  Onset of symptoms 12/30/2021.    The patient does have symptoms concerning for COVID-19 infection (fever, chills, cough, or new shortness of breath).   Past Medical, Surgical, Social History, Allergies, and Medications have been Reviewed.  Past Medical History:  Diagnosis Date   Abdominal pain, epigastric 10/07/2018   Acute non-recurrent maxillary sinusitis 11/07/2019   Anxiety    Arthritis    Binge-eating and purging type anorexia nervosa    Bipolar affective disorder ([place]) 12/04/2015   Bipolar affective disorder, current episode manic without psychotic symptoms ([place]) 12/04/2015   Bipolar disorder ([place])    Cellulitis and abscess of buttock 02/15/2013   Cystitis, interstitial    Depression    Depression     Phreesia 01/28/2020   Esophageal polyp    about 20 years ago   GERD (gastroesophageal reflux disease)    Left wrist pain 03/22/2019   OCD (obsessive compulsive disorder)    Palpitations 05/25/2014   Rectal pain 12/06/2018   Rib pain on right side 01/31/2019   Rosacea 03/27/2019   Sprain of temporomandibular joint or ligament 01/10/2019   Past Surgical History:  Procedure Laterality Date   BLADDER SURGERY     CHOLECYSTECTOMY     COLONOSCOPY WITH PROPOFOL N/A 10/05/2019   Procedure: COLONOSCOPY WITH PROPOFOL;  Surgeon: Daneil Dolin, MD;  [facility];  Service: Endoscopy;  Laterality: N/A;  12:45pm   COSMETIC SURGERY N/A    Phreesia 01/28/2020   ESOPHAGOGASTRODUODENOSCOPY  09/28/2016   [facility]; Dr. Therisa Doyne; erosions in the esophagus, 5 cm hiatal hernia, nonbleeding erosive gastropathy s/p biopsied, normal duodenum.  Path with chronic inactive gastritis, no H. pylori or intestinal metaplasia.    ESOPHAGOGASTRODUODENOSCOPY (EGD) WITH PROPOFOL N/A 10/17/2018   Dr. Gala Romney: mild reflux esophagitis, small hiatal hernia   POLYPECTOMY  10/05/2019   Procedure: POLYPECTOMY;  Surgeon: Daneil Dolin, MD;  [facility];  Service: Endoscopy;;   SVT ABLATION N/A 10/31/2021   Procedure: SVT ABLATION;  Surgeon: Melida Quitter, MD;  Location: [facility];  Service: Cardiovascular;  Laterality: N/A;   VOCAL CORD LATERALIZATION, ENDOSCOPIC APPROACH W/ MLB       Current Meds  Medication Sig   albuterol (VENTOLIN HFA) 108 (90 Base) MCG/ACT inhaler  Inhale 2 puffs into the lungs every 6 (six) hours as needed for wheezing or shortness of breath.   Brexpiprazole ([name]) 4 MG TABS Take 4 mg by mouth in the morning.   colestipol (COLESTID) 1 g tablet Take 4 tablets (4 g total) by mouth 2 (two) times daily.   cromolyn (OPTICROM) 4 % ophthalmic solution Place 1 drop into both eyes daily.   dicyclomine (BENTYL) 20 MG tablet Take 1 tablet (20 mg total) by mouth 3 (three) times  daily as needed for spasms (Diarrhea).   Famotidine-Ca Carb-Mag Hydrox (PEPCID COMPLETE PO) Take 1 tablet by mouth daily.   fluvoxaMINE (LUVOX) 100 MG tablet Take 4 tablets (400 mg total) by mouth at bedtime.   gabapentin (NEURONTIN) 600 MG tablet 2 p.o. every morning, 1 p.o. q. afternoon, 2 p.o. nightly. (Patient taking differently: Take 600-1,200 mg by mouth See admin instructions. 2 p.o. every morning, 1 p.o. q. afternoon, 2 p.o. nightly.)   hydrOXYzine (ATARAX) 25 MG tablet Take 1-2 tablets (25-50 mg total) by mouth every 8 (eight) hours as needed.   loratadine (CLARITIN) 5 MG chewable tablet Chew 5 mg by mouth in the morning and at bedtime.   mirtazapine (REMERON) 15 MG tablet Take 1 tablet (15 mg total) by mouth at bedtime.   naproxen (NAPROSYN) 500 MG tablet Take 1,500 mg by mouth as needed for migraine.   ondansetron (ZOFRAN-ODT) 4 MG disintegrating tablet Take 1 tablet (4 mg total) by mouth every 8 (eight) hours as needed for nausea or vomiting.   pramipexole (MIRAPEX) 0.5 MG tablet Take 1 tablet (0.5 mg total) by mouth at bedtime as needed.   prednisoLONE acetate (PRED FORTE) 1 % ophthalmic suspension Place 1 drop into both eyes daily.   RESTASIS 0.05 % ophthalmic emulsion Place 1 drop into both eyes 2 (two) times daily.   Rimegepant Sulfate (NURTEC) 75 MG TBDP Take 75 mg by mouth daily as needed (take for abortive therapy of migraine, no more than 1 tablet in 24 hours or 8 per month).   valACYclovir (VALTREX) 500 MG tablet Take 500 mg by mouth daily.     Allergies:   Codeine, Penicillins, Divalproex sodium, Meloxicam, Sonata [zaleplon], Topamax [topiramate], Aciphex [rabeprazole], Aimovig [erenumab-aooe], and Galcanezumab-gnlm   ROS:   Please see the history of present illness.     All other systems reviewed and are negative.   Labs/Other Tests and Data Reviewed:    Recent Labs: 08/01/2021: ALT 23 09/10/2021: Magnesium 2.2; TSH 1.335 10/20/2021: BUN 15; Creatinine, Ser 0.92;  Hemoglobin 14.2; Platelets 198; Potassium 4.5; Sodium 140   Recent Lipid Panel Lab Results  Component Value Date/Time   CHOL 160 08/01/2021 08:16 AM   TRIG 110 08/01/2021 08:16 AM   HDL 54 08/01/2021 08:16 AM   CHOLHDL 3.0 08/01/2021 08:16 AM   CHOLHDL 2.8 12/06/2015 06:09 AM   LDLCALC 86 08/01/2021 08:16 AM    Wt Readings from Last 3 Encounters:  12/22/21 183 lb (83 kg)  12/17/21 181 lb 6.4 oz (82.3 kg)  12/02/21 181 lb (82.1 kg)     Objective:    Vital Signs:  LMP 10/07/2011      ASSESSMENT & PLAN:   Viral illness Encouraged to continue taking Mucinex cough and congestion Encouraged to take Tylenol as needed for headaches and body Encouraged to increase her fluid intake Encouraged use of a humidifier at home to open up her sinuses Encouraged warm salt gargles 3-4 times daily for sore throat Will assess patient for COVID  and flu Encouraged to contact the practice for worsening of symptoms  Time:   Today, I have spent 12 minutes reviewing the chart, including problem list, medications, and with the patient with telehealth technology discussing the above problems.   Medication Adjustments/Labs and Tests Ordered: Current medicines are reviewed at length with the patient today.  Concerns regarding medicines are outlined above.   Tests Ordered: No orders of the defined types were placed in this encounter.   Medication Changes: No orders of the defined types were placed in this encounter.    Note: This dictation was prepared with Dragon dictation along with smaller phrase technology. Similar sounding words can be transcribed inadequately or may not be corrected upon review. Any transcriptional errors that result from this process are unintentional.      Disposition:  Follow up  Signed, Alvira Monday, FNP  01/01/2022 11:37 PM     [facility]

## 2022-01-05 ENCOUNTER — Encounter: Payer: Self-pay | Admitting: Cardiovascular Disease

## 2022-01-08 ENCOUNTER — Telehealth: Payer: Self-pay | Admitting: Physician Assistant

## 2022-01-08 NOTE — Telephone Encounter (Signed)
Patient is waiting on PA for fluvoxamine. I don't see one in CMM.  Are you aware of need for PA? She is asking for samples and I will call her about that.

## 2022-01-08 NOTE — Telephone Encounter (Signed)
Prior Approval received for Fluvoxamine 100 mg #120/30 day with [employer] through 01/19/2023, PA# [id number]

## 2022-01-08 NOTE — Telephone Encounter (Signed)
Jennifer [name] today at 1:33 stating she has been waiting for 2 days for a PA on the [employer]. She is inquiring if she can come by the office to obtain some in the mean time.\  Last seen 12/23/21 Next appointment 01/29/22  Contact # [phone]

## 2022-01-08 NOTE — Telephone Encounter (Signed)
Patient said the pharmacy will fax over PA request. She is on 4 qd, so is a quantity issue. She will get using GoodRx tomorrow for $48.00.

## 2022-01-08 NOTE — Telephone Encounter (Signed)
Patient notified

## 2022-01-12 ENCOUNTER — Other Ambulatory Visit: Payer: Self-pay

## 2022-01-12 ENCOUNTER — Emergency Department (HOSPITAL_COMMUNITY): Payer: Medicare PPO

## 2022-01-12 ENCOUNTER — Encounter (HOSPITAL_COMMUNITY): Payer: Self-pay

## 2022-01-12 ENCOUNTER — Emergency Department (HOSPITAL_COMMUNITY)
Admission: EM | Admit: 2022-01-12 | Discharge: 2022-01-12 | Disposition: A | Discharge status: Home / Self Care | Payer: Medicare PPO | Attending: Emergency Medicine | Admitting: Emergency Medicine

## 2022-01-12 DIAGNOSIS — R051 Acute cough: Secondary | ICD-10-CM | POA: Diagnosis not present

## 2022-01-12 DIAGNOSIS — J111 Influenza due to unidentified influenza virus with other respiratory manifestations: Secondary | ICD-10-CM

## 2022-01-12 DIAGNOSIS — R059 Cough, unspecified: Secondary | ICD-10-CM | POA: Diagnosis not present

## 2022-01-12 DIAGNOSIS — R079 Chest pain, unspecified: Secondary | ICD-10-CM | POA: Diagnosis not present

## 2022-01-12 DIAGNOSIS — J101 Influenza due to other identified influenza virus with other respiratory manifestations: Secondary | ICD-10-CM | POA: Insufficient documentation

## 2022-01-12 DIAGNOSIS — R509 Fever, unspecified: Secondary | ICD-10-CM | POA: Diagnosis not present

## 2022-01-12 DIAGNOSIS — Z1152 Encounter for screening for COVID-19: Secondary | ICD-10-CM | POA: Insufficient documentation

## 2022-01-12 LAB — RESP PANEL BY RT-PCR (RSV, FLU A&B, COVID)  RVPGX2
Influenza A by PCR: POSITIVE — AB
Influenza B by PCR: NEGATIVE
Resp Syncytial Virus by PCR: NEGATIVE
SARS Coronavirus 2 by RT PCR: NEGATIVE

## 2022-01-12 MED ORDER — BENZONATATE 200 MG PO CAPS: 200.0000 mg | ORAL_CAPSULE | Freq: Three times a day (TID) | ORAL | 0 refills | Status: DC | PRN

## 2022-01-12 MED ORDER — DOXYCYCLINE HYCLATE 100 MG PO CAPS: 100.0000 mg | ORAL_CAPSULE | Freq: Two times a day (BID) | ORAL | 0 refills | Status: DC

## 2022-01-12 NOTE — ED Provider Notes (Signed)
[name] Provider Note   CSN: [id number] Arrival date & time: 01/12/22  1559     History  Chief Complaint  Patient presents with   Cough    Jennifer [name] is a 56 y.o. female.   Cough Associated symptoms: chest pain (burning chest pain with cough), chills, fever and myalgias   Associated symptoms: no headaches, no rash, no rhinorrhea, no shortness of breath, no sore throat and no wheezing        Jennifer [name] is a 56 y.o. female with past medical history o anxiety, bipolar, GERD bulimia who presents to the Emergency Department complaining of cough, subjective fever, burning of her chest when she coughs and posttussive emesis.  Symptoms present for 2 days.  Fever at home unknown.  She does endorse taking Tylenol this morning around 930.  States her son has been recently been sick with similar symptoms.  The burning sensation of her chest is associated with coughing only.  Has albuterol at home but has not been using it.  States she was recently diagnosed with emphysema. Does not use oxygen at home. Drinks fluids.  Taking OTC cold and cough medications without relief    Home Medications Prior to Admission medications   Medication Sig Start Date End Date Taking? Authorizing Provider  albuterol (VENTOLIN HFA) 108 (90 Base) MCG/ACT inhaler Inhale 2 puffs into the lungs every 6 (six) hours as needed for wheezing or shortness of breath. 12/17/21   Lindell Spar, MD  Brexpiprazole ([name]) 4 MG TABS Take 4 mg by mouth in the morning. 12/16/21   Addison Lank, PA-C  colestipol (COLESTID) 1 g tablet Take 4 tablets (4 g total) by mouth 2 (two) times daily. 12/22/21 06/20/22  Sherron Monday, NP  cromolyn (OPTICROM) 4 % ophthalmic solution Place 1 drop into both eyes daily. 11/16/20   [provider]  dicyclomine (BENTYL) 20 MG tablet Take 1 tablet (20 mg total) by mouth 3 (three) times daily as needed for spasms (Diarrhea). 07/31/21   Lindell Spar, MD   Famotidine-Ca Carb-Mag Hydrox (PEPCID COMPLETE PO) Take 1 tablet by mouth daily.    [provider]  fluvoxaMINE (LUVOX) 100 MG tablet Take 4 tablets (400 mg total) by mouth at bedtime. 12/16/21   [name] T, PA-C  gabapentin (NEURONTIN) 600 MG tablet 2 p.o. every morning, 1 p.o. q. afternoon, 2 p.o. nightly. Patient taking differently: Take 600-1,200 mg by mouth See admin instructions. 2 p.o. every morning, 1 p.o. q. afternoon, 2 p.o. nightly. 09/09/21   Addison Lank, PA-C  hydrOXYzine (ATARAX) 25 MG tablet Take 1-2 tablets (25-50 mg total) by mouth every 8 (eight) hours as needed. 12/16/21   [name] T, PA-C  loratadine (CLARITIN) 5 MG chewable tablet Chew 5 mg by mouth in the morning and at bedtime.    [provider]  mirtazapine (REMERON) 15 MG tablet Take 1 tablet (15 mg total) by mouth at bedtime. 07/08/21   [name] T, PA-C  naproxen (NAPROSYN) 500 MG tablet Take 1,500 mg by mouth as needed for migraine.    [provider]  ondansetron (ZOFRAN-ODT) 4 MG disintegrating tablet Take 1 tablet (4 mg total) by mouth every 8 (eight) hours as needed for nausea or vomiting. 12/22/21   Sherron Monday, NP  pramipexole (MIRAPEX) 0.5 MG tablet Take 1 tablet (0.5 mg total) by mouth at bedtime as needed. 11/12/21   Lomax, Amy, NP  prednisoLONE acetate (PRED FORTE)  1 % ophthalmic suspension Place 1 drop into both eyes daily.    [provider]  RESTASIS 0.05 % ophthalmic emulsion Place 1 drop into both eyes 2 (two) times daily. 11/11/20   [provider]  Rimegepant Sulfate (NURTEC) 75 MG TBDP Take 75 mg by mouth daily as needed (take for abortive therapy of migraine, no more than 1 tablet in 24 hours or 8 per month). 11/12/21   Lomax, Amy, NP  valACYclovir (VALTREX) 500 MG tablet Take 500 mg by mouth daily. 01/03/18   [provider]      Allergies    Codeine, Penicillins, Divalproex sodium, Meloxicam, Sonata [zaleplon], Topamax  [topiramate], Aciphex [rabeprazole], Aimovig [erenumab-aooe], and Galcanezumab-gnlm    Review of Systems   Review of Systems  Constitutional:  Positive for chills and fever. Negative for appetite change.  HENT:  Negative for congestion, rhinorrhea, sore throat and trouble swallowing.   Respiratory:  Positive for cough. Negative for shortness of breath and wheezing.   Cardiovascular:  Positive for chest pain (burning chest pain with cough).  Gastrointestinal:  Positive for vomiting. Negative for abdominal pain, diarrhea and nausea.  Genitourinary:  Negative for dysuria and flank pain.  Musculoskeletal:  Positive for myalgias. Negative for arthralgias.  Skin:  Negative for rash.  Neurological:  Negative for dizziness, weakness, numbness and headaches.    Physical Exam Updated Vital Signs BP 132/88 (BP Location: Right Arm)   Pulse 80   Temp 98.7 F (37.1 C) (Oral)   Ht '5\' 4"'$  (1.626 m)   Wt 83 kg   LMP 10/07/2011   BMI 31.41 kg/m  Physical Exam Vitals and nursing note reviewed.  Constitutional:      General: She is not in acute distress.    Appearance: Normal appearance. She is not ill-appearing or toxic-appearing.  HENT:     Nose: Nose normal.     Mouth/Throat:     Mouth: Mucous membranes are moist.     Pharynx: Oropharynx is clear. Posterior oropharyngeal erythema present. No oropharyngeal exudate.  Cardiovascular:     Rate and Rhythm: Normal rate and regular rhythm.     Pulses: Normal pulses.  Pulmonary:     Effort: Pulmonary effort is normal. No respiratory distress.     Breath sounds: Wheezing present.     Comments: Slight expiratory wheeze, mainly on the right.  No increased work of breathing or rales on exam. Abdominal:     Palpations: Abdomen is soft.     Tenderness: There is no abdominal tenderness. There is no guarding.  Musculoskeletal:        General: Normal range of motion.     Cervical back: Normal range of motion. No rigidity.     Right lower leg: No edema.      Left lower leg: No edema.  Lymphadenopathy:     Cervical: No cervical adenopathy.  Skin:    General: Skin is warm.     Capillary Refill: Capillary refill takes less than 2 seconds.  Neurological:     General: No focal deficit present.     Mental Status: She is alert.     Sensory: No sensory deficit.     Motor: No weakness.     ED Results / Procedures / Treatments   Labs (all labs ordered are listed, but only abnormal results are displayed) Labs Reviewed  RESP PANEL BY RT-PCR (RSV, FLU A&B, COVID)  RVPGX2 - Abnormal; Notable for the following components:      Result Value  Influenza A by PCR POSITIVE (*)    All other components within normal limits    EKG EKG Interpretation  Date/Time:  Monday January 12 2022 16:10:07 EST Ventricular Rate:  78 PR Interval:  140 QRS Duration: 84 QT Interval:  356 QTC Calculation: 405 R Axis:   76 Text Interpretation: Normal sinus rhythm  nonspecific ST segments, overall similar to Aug 2023 Confirmed by Sherwood Gambler [phone]) on 01/12/2022 4:15:42 PM  Radiology DG Chest 2 View  Result Date: 01/12/2022 CLINICAL DATA:  Productive cough and fever for several days. EXAM: CHEST - 2 VIEW COMPARISON:  09/10/2021 FINDINGS: The heart size and mediastinal contours are within normal limits. Mild infiltrate is seen in the left lower lobe, suspicious for pneumonia. Right lung is clear. No evidence of pleural effusion. IMPRESSION: Mild left lower lobe infiltrate, suspicious for pneumonia. Recommend continued chest radiographic follow-up to confirm resolution. Electronically Signed   By: Marlaine Hind M.D.   On: 01/12/2022 16:38    Procedures Procedures    Medications Ordered in ED Medications - No data to display  ED Course/ Medical Decision Making/ A&P                           Medical Decision Making Patient here for evaluation of cough, subjective fever, burning chest pain associated with cough and posttussive emesis.  Symptoms present  for 2 to 3 days.  Patient's spouse with similar symptoms recently.  Denies abdominal pain, shortness of breath and diarrhea.    Review of medical records, patient had televisit on 01/01/2022 diagnosed with viral process.  On exam, patient well-appearing nontoxic.  No hypoxia or fever here.  Abdomen soft nontender.  She does have some expiratory wheezes on the right.  No increased work of breathing on my exam.  Mucous membranes are moist.  Patient with reported history of emphysema although I cannot find this documented in her medical record.  At this point, I suspect viral illness, pneumonia also considered.  Chest pain is described as a burning sensation that is associated with coughing.  She denies any shortness of breath and she is not tachycardic or tachypneic here and no hypoxia.  Will check respiratory panel, chest x-ray.  ACS considered but felt less likely given description of chest pain as burning and only associated with coughing and no cardiac history   Amount and/or Complexity of Data Reviewed Labs: ordered.    Details: Respiratory panel positive for flu A Radiology: ordered.    Details: Chest x-ray today ordered for further evaluation patient's symptoms, shows mild left lower lobe infiltrate suspicious for pneumonia ECG/medicine tests: ordered.    Details: EKG shows sinus rhythm Discussion of management or test interpretation with external provider(s): On further history, pt endorses burning chest pain and cough at time of her televisit 10 days ago.  I feel it would be reasonable to start antibiotic for presumptive PNA.  There is no tachycardia, hypoxia or tachypnea to suggest PE.    I feel she is appropriate for d/c home.  Will start doxy since she has PCN allergy and she will f/u closely with PCP.            Final Clinical Impression(s) / ED Diagnoses Final diagnoses:  Acute cough  Influenza    Rx / DC Orders ED Discharge Orders     None         Kem Parkinson, PA-C 01/12/22 1750    Sherwood Gambler,  MD 01/15/22 [phone]

## 2022-01-12 NOTE — Discharge Instructions (Signed)
Continue to use your albuterol inhaler, 1 to 2 puffs every 4-6 hours as needed.  Rest, drink plenty of fluids.  Take the antibiotic as directed until finished.  You may take Tylenol every 4 hours and take your naproxen twice a day with food, the Tylenol and naproxen will help with fever and bodyaches.  Please follow-up with your primary care provider for recheck.

## 2022-01-12 NOTE — ED Triage Notes (Signed)
Has been running a fever for the last two days. Productive cough, much worse today.  Chest hurts when she is coughing, feels like her chest is on fire. Husband has been sick with similar symptoms. Did take tylenol this am about 9:30 am.

## 2022-01-13 ENCOUNTER — Ambulatory Visit: Payer: Medicare PPO | Admitting: Podiatry

## 2022-01-14 ENCOUNTER — Ambulatory Visit (INDEPENDENT_AMBULATORY_CARE_PROVIDER_SITE_OTHER): Payer: Medicare PPO | Admitting: Internal Medicine

## 2022-01-14 ENCOUNTER — Encounter: Payer: Self-pay | Admitting: Internal Medicine

## 2022-01-14 ENCOUNTER — Telehealth: Payer: Self-pay | Admitting: Internal Medicine

## 2022-01-14 DIAGNOSIS — J189 Pneumonia, unspecified organism: Secondary | ICD-10-CM

## 2022-01-14 DIAGNOSIS — J101 Influenza due to other identified influenza virus with other respiratory manifestations: Secondary | ICD-10-CM | POA: Diagnosis not present

## 2022-01-14 MED ORDER — AZITHROMYCIN 250 MG PO TABS: ORAL_TABLET | ORAL | 0 refills | Status: AC

## 2022-01-14 NOTE — Progress Notes (Signed)
Virtual Visit via Telephone Note   This visit type was conducted via telephone. This format is felt to be most appropriate for this patient at this time.  The patient did not have access to video technology/had technical difficulties with video requiring transitioning to audio format only (telephone).  All issues noted in this document were discussed and addressed.  No physical exam could be performed with this format.  Evaluation Performed:  Follow-up visit  Date:  01/14/2022   ID:  Jennifer [name], Jennifer [name] 1965-05-16, MRN [id number]  Patient Location: Home Provider Location: Office/Clinic  Participants: Patient Location of Patient: Home Location of Provider: Telehealth Consent was obtain for visit to be over via telehealth. I verified that I am speaking with the correct person using two identifiers.  PCP:  Lindell Spar, MD   Chief Complaint: Cough and nasal congestion  History of Present Illness:    Jennifer [name] is a 56 y.o. female who has a televisit for complaint of cough, nasal congestion and sinus pressure related headache for the last 5 days.  She denies any fever or chills.  She reports chronic dyspnea, worse with exertion and mild wheezing.  She went to ER on 12/25 and was given doxycycline for CAP. Of note, she was found to have influenza A, but was deemed to be outside of treatment window.  The patient does not have symptoms concerning for COVID-19 infection (fever, chills, cough, or new shortness of breath).   Past Medical, Surgical, Social History, Allergies, and Medications have been Reviewed.  Past Medical History:  Diagnosis Date   Abdominal pain, epigastric 10/07/2018   Acute non-recurrent maxillary sinusitis 11/07/2019   Anxiety    Arthritis    Binge-eating and purging type anorexia nervosa    Bipolar affective disorder ([place]) 12/04/2015   Bipolar affective disorder, current episode manic without psychotic symptoms ([place]) 12/04/2015   Bipolar disorder  ([place])    Cellulitis and abscess of buttock 02/15/2013   Cystitis, interstitial    Depression    Depression    Phreesia 01/28/2020   Esophageal polyp    about 20 years ago   GERD (gastroesophageal reflux disease)    Left wrist pain 03/22/2019   OCD (obsessive compulsive disorder)    Palpitations 05/25/2014   Rectal pain 12/06/2018   Rib pain on right side 01/31/2019   Rosacea 03/27/2019   Sprain of temporomandibular joint or ligament 01/10/2019   Past Surgical History:  Procedure Laterality Date   BLADDER SURGERY     CHOLECYSTECTOMY     COLONOSCOPY WITH PROPOFOL N/A 10/05/2019   Procedure: COLONOSCOPY WITH PROPOFOL;  Surgeon: Daneil Dolin, MD;  [facility];  Service: Endoscopy;  Laterality: N/A;  12:45pm   COSMETIC SURGERY N/A    Phreesia 01/28/2020   ESOPHAGOGASTRODUODENOSCOPY  09/28/2016   [facility]; Dr. Therisa Doyne; erosions in the esophagus, 5 cm hiatal hernia, nonbleeding erosive gastropathy s/p biopsied, normal duodenum.  Path with chronic inactive gastritis, no H. pylori or intestinal metaplasia.    ESOPHAGOGASTRODUODENOSCOPY (EGD) WITH PROPOFOL N/A 10/17/2018   Dr. Gala Romney: mild reflux esophagitis, small hiatal hernia   POLYPECTOMY  10/05/2019   Procedure: POLYPECTOMY;  Surgeon: Daneil Dolin, MD;  Location: [facility];  Service: Endoscopy;;   SVT ABLATION N/A 10/31/2021   Procedure: SVT ABLATION;  Surgeon: Melida Quitter, MD;  Location: [facility];  Service: Cardiovascular;  Laterality: N/A;   VOCAL CORD LATERALIZATION, ENDOSCOPIC APPROACH W/ MLB  No outpatient medications have been marked as taking for the 01/14/22 encounter (Office Visit) with Lindell Spar, MD.     Allergies:   Codeine, Penicillins, Divalproex sodium, Meloxicam, Sonata [zaleplon], Topamax [topiramate], Aciphex [rabeprazole], Aimovig [erenumab-aooe], and Galcanezumab-gnlm   ROS:   Please see the history of present illness.     All other systems reviewed and are  negative.   Labs/Other Tests and Data Reviewed:    Recent Labs: 08/01/2021: ALT 23 09/10/2021: Magnesium 2.2; TSH 1.335 10/20/2021: BUN 15; Creatinine, Ser 0.92; Hemoglobin 14.2; Platelets 198; Potassium 4.5; Sodium 140   Recent Lipid Panel Lab Results  Component Value Date/Time   CHOL 160 08/01/2021 08:16 AM   TRIG 110 08/01/2021 08:16 AM   HDL 54 08/01/2021 08:16 AM   CHOLHDL 3.0 08/01/2021 08:16 AM   CHOLHDL 2.8 12/06/2015 06:09 AM   LDLCALC 86 08/01/2021 08:16 AM    Wt Readings from Last 3 Encounters:  01/12/22 183 lb (83 kg)  12/22/21 183 lb (83 kg)  12/17/21 181 lb 6.4 oz (82.3 kg)     ASSESSMENT & PLAN:    CAP Influenza A Recent ER visit chart reviewed, including x-ray of chest Was given doxycycline probably for CAP, but she had constipation and mild oozing/rectal bleeding - changed to Azithromycin Continue Tessalon and Robitussin as needed for cough Maintain adequate hydration Continue albuterol as needed for dyspnea and wheezing   Time:   Today, I have spent 15 minutes reviewing the chart, including problem list, medications, and with the patient with telehealth technology discussing the above problems.   Medication Adjustments/Labs and Tests Ordered: Current medicines are reviewed at length with the patient today.  Concerns regarding medicines are outlined above.   Tests Ordered: No orders of the defined types were placed in this encounter.   Medication Changes: No orders of the defined types were placed in this encounter.    Note: This dictation was prepared with Dragon dictation along with smaller phrase technology. Similar sounding words can be transcribed inadequately or may not be corrected upon review. Any transcriptional errors that result from this process are unintentional.      Disposition:  Follow up  Signed, Lindell Spar, MD  01/14/2022 11:29 AM     [place]

## 2022-01-14 NOTE — Telephone Encounter (Signed)
Pt called stating she was seen in ER on 12.25.23. states she has the flu & was given doxycycline. States this is causing her to have constipation & rectal bleeding. She is wanting to know if we can give her a Z-pack to replace the doxycycline?     [place]

## 2022-01-15 ENCOUNTER — Telehealth: Payer: Self-pay

## 2022-01-15 ENCOUNTER — Telehealth: Payer: Self-pay | Admitting: *Deleted

## 2022-01-15 NOTE — Telephone Encounter (Signed)
Pt called and states when she had a BM earlier today there was a lot of blood in the toilet. She states it only happened once, but she is not sure what it is coming from. Please advise.

## 2022-01-15 NOTE — Telephone Encounter (Signed)
Patient advsied 

## 2022-01-16 DIAGNOSIS — F429 Obsessive-compulsive disorder, unspecified: Secondary | ICD-10-CM | POA: Diagnosis not present

## 2022-01-16 DIAGNOSIS — F502 Bulimia nervosa: Secondary | ICD-10-CM | POA: Diagnosis not present

## 2022-01-16 DIAGNOSIS — F319 Bipolar disorder, unspecified: Secondary | ICD-10-CM | POA: Diagnosis not present

## 2022-01-16 DIAGNOSIS — F4310 Post-traumatic stress disorder, unspecified: Secondary | ICD-10-CM | POA: Diagnosis not present

## 2022-01-16 NOTE — Telephone Encounter (Signed)
Spoke to pt, she informed me that she thinks it was an internal hemorrhoid. She states she has not seen any more bleeding since yesterday morning. She has had a couple of small bowel movements with no blood. States she feels fine this morning. Informed if she starts to feel dizzy or lightheaded or has a significant amount of blood to proceed to ED. She voiced understanding.

## 2022-01-21 ENCOUNTER — Telehealth: Payer: Self-pay | Admitting: *Deleted

## 2022-01-21 ENCOUNTER — Other Ambulatory Visit: Payer: Self-pay | Admitting: Internal Medicine

## 2022-01-21 ENCOUNTER — Ambulatory Visit: Payer: Medicare PPO | Admitting: Podiatry

## 2022-01-21 ENCOUNTER — Telehealth: Payer: Self-pay | Admitting: Internal Medicine

## 2022-01-21 DIAGNOSIS — F319 Bipolar disorder, unspecified: Secondary | ICD-10-CM | POA: Diagnosis not present

## 2022-01-21 DIAGNOSIS — J189 Pneumonia, unspecified organism: Secondary | ICD-10-CM

## 2022-01-21 DIAGNOSIS — F502 Bulimia nervosa: Secondary | ICD-10-CM | POA: Diagnosis not present

## 2022-01-21 DIAGNOSIS — F429 Obsessive-compulsive disorder, unspecified: Secondary | ICD-10-CM | POA: Diagnosis not present

## 2022-01-21 DIAGNOSIS — F4310 Post-traumatic stress disorder, unspecified: Secondary | ICD-10-CM | POA: Diagnosis not present

## 2022-01-21 MED ORDER — BENZONATATE 200 MG PO CAPS: 200.0000 mg | ORAL_CAPSULE | Freq: Two times a day (BID) | ORAL | 0 refills | Status: DC | PRN

## 2022-01-21 NOTE — Telephone Encounter (Signed)
     Patient  visit on 01/12/2022  at [facility] ed was for URI  Have you been able to follow up with your primary care physician? Patient still has a cough and she is felling better just had a different antibiotic given to her by dr and she is taking that one and recently also got something for her cough  The patient was  able to obtain any needed medicine or equipment. Yes   Are there diet recommendations that you are having difficulty following?  Patient expresses understanding of discharge instructions and education provided has no other needs at this time. yes   Jennifer [name] [phone] [address] , [place] [address] Email : [email or web address]

## 2022-01-21 NOTE — Telephone Encounter (Signed)
Pt called stating she still has a bad cough from the flu & pneumonia. She is wanting to know if she can get more benzonatate (TESSALON) 200 MG capsule ?  Latta Phar

## 2022-01-23 ENCOUNTER — Telehealth: Payer: Self-pay | Admitting: Podiatry

## 2022-01-23 NOTE — Telephone Encounter (Signed)
DOS: 02/23/2022  [employer]   Exc. Nail Perm. Hallux B/L (11750)  Deductible: $0 Out-of-Pocket: $3,300 with $0 met CoInsurance: 0%  The following codes do not require a pre-authorization All services are subject to members benefits, exclusions, limitations and other applicable conditions. Created on 01/23/2022 Service info 11750 Excision of nail and nail matrix, partial or complete (eg, ingrown or deformed nail), for permanent removal This document contains confidential information and is protected by the [place] (HIPAA), the [place] [employer] [place] (HITECH) and a number of other federal and state privacy laws. This document and its contents may only be accessed, used or disclosed by duly authorized individuals in the course of the subject's treatment, claims processing or as otherwise required or permitted by law. Any other access, use or disclosure is strictly prohibited and may result in civil or criminal penalties

## 2022-01-27 ENCOUNTER — Telehealth (INDEPENDENT_AMBULATORY_CARE_PROVIDER_SITE_OTHER): Payer: Medicare PPO | Admitting: Family Medicine

## 2022-01-27 ENCOUNTER — Encounter: Payer: Self-pay | Admitting: Family Medicine

## 2022-01-27 VITALS — Ht 64.0 in | Wt 173.0 lb

## 2022-01-27 DIAGNOSIS — B370 Candidal stomatitis: Secondary | ICD-10-CM | POA: Diagnosis not present

## 2022-01-27 MED ORDER — CLOTRIMAZOLE 10 MG MT TROC: 10.0000 mg | Freq: Every day | OROMUCOSAL | 0 refills | Status: DC

## 2022-01-27 NOTE — Progress Notes (Addendum)
    Tele Health Visit  Subjective:     Patient ID: Jennifer [name], female    DOB: August 15, 1965, 57 y.o.   MRN: [id number]  Chief Complaint  Patient presents with   [place]    Patient thinks she has thrush. Was on antibiotics but did not finish course due to bleeding.     Jennifer [name] 57 year old female, past medical history reviewed. Present via tele health visit for thrush. Patient reports white spots and patches in mouth and tongue. Onset of symptoms was this morning unchanged since that time. Patient denies loss of taste, pain, swelling, burning sensation, itching. Patient reports unpleasant taste, is drinking plenty of fluids, is able to swallow and eat her food normal.      Patient Location: At home. Provider Location: [facility] Total time spent with patient  via telehealth/video 9 minutes    Review of Systems  Constitutional:  Negative for chills and fever.  Eyes:  Negative for blurred vision.  Respiratory:  Negative for shortness of breath.   Cardiovascular:  Negative for chest pain.  Gastrointestinal:  Negative for abdominal pain, nausea and vomiting.  Skin:  Negative for itching and rash.  Neurological:  Negative for dizziness and headaches.  Endo/Heme/Allergies:  Does not bruise/bleed easily.        Objective:    Ht '5\' 4"'$  (1.626 m)   Wt 173 lb (78.5 kg)   LMP 10/07/2011   BMI 29.70 kg/m    Physical Exam  Tele- Health Visit   No results found for any visits on 01/27/22.      Assessment & Plan:   Problem List Items Addressed This Visit       Digestive   Oral candidiasis - Primary    Seen patient mouth through video camera White medium size patches seen on tongue Prescribed clotrimazole 10 mg troche      Relevant Medications   clotrimazole (MYCELEX) 10 MG troche    Meds ordered this encounter  Medications   clotrimazole (MYCELEX) 10 MG troche    Sig: Take 1 tablet (10 mg total) by mouth 5 (five) times daily.     Dispense:  70 Troche    Refill:  0    No follow-ups on file.  Renard Hamper Ria Comment, FNP

## 2022-01-28 DIAGNOSIS — F502 Bulimia nervosa: Secondary | ICD-10-CM | POA: Diagnosis not present

## 2022-01-28 DIAGNOSIS — F319 Bipolar disorder, unspecified: Secondary | ICD-10-CM | POA: Diagnosis not present

## 2022-01-28 DIAGNOSIS — F4310 Post-traumatic stress disorder, unspecified: Secondary | ICD-10-CM | POA: Diagnosis not present

## 2022-01-28 DIAGNOSIS — F429 Obsessive-compulsive disorder, unspecified: Secondary | ICD-10-CM | POA: Diagnosis not present

## 2022-01-28 NOTE — Assessment & Plan Note (Addendum)
Seen patient mouth through video camera White medium size patches seen on tongue Prescribed clotrimazole 10 mg troche

## 2022-01-29 ENCOUNTER — Telehealth (INDEPENDENT_AMBULATORY_CARE_PROVIDER_SITE_OTHER): Payer: Medicare PPO | Admitting: Physician Assistant

## 2022-01-29 ENCOUNTER — Encounter: Payer: Self-pay | Admitting: Physician Assistant

## 2022-01-29 DIAGNOSIS — F411 Generalized anxiety disorder: Secondary | ICD-10-CM

## 2022-01-29 DIAGNOSIS — F429 Obsessive-compulsive disorder, unspecified: Secondary | ICD-10-CM

## 2022-01-29 DIAGNOSIS — F99 Mental disorder, not otherwise specified: Secondary | ICD-10-CM

## 2022-01-29 DIAGNOSIS — F319 Bipolar disorder, unspecified: Secondary | ICD-10-CM | POA: Diagnosis not present

## 2022-01-29 DIAGNOSIS — F502 Bulimia nervosa: Secondary | ICD-10-CM | POA: Diagnosis not present

## 2022-01-29 DIAGNOSIS — F5105 Insomnia due to other mental disorder: Secondary | ICD-10-CM | POA: Diagnosis not present

## 2022-01-29 MED ORDER — GABAPENTIN 600 MG PO TABS: ORAL_TABLET | ORAL | 11 refills | Status: DC

## 2022-01-29 NOTE — Progress Notes (Signed)
[facility]  Patient ID: [name],  MRN: [id number]  PCP: Lindell Spar, MD  Date of Evaluation: 01/29/2022 time spent:20 minutes  Chief Complaint:  Chief Complaint   Depression; Insomnia; Follow-up    Virtual Visit via Telehealth  I connected with patient by a video enabled telemedicine application, with their informed consent, and verified patient privacy and that I am speaking with the correct person using two identifiers.  I am private, in my office and the patient is at home.  I discussed the limitations, risks, security and privacy concerns of performing an evaluation and management service by video and the availability of in person appointments. I also discussed with the patient that there may be a patient responsible charge related to this service. The patient expressed understanding and agreed to proceed.   I discussed the assessment and treatment plan with the patient. The patient was provided an opportunity to ask questions and all were answered. The patient agreed with the plan and demonstrated an understanding of the instructions.   The patient was advised to call back or seek an in-person evaluation if the symptoms worsen or if the condition fails to improve as anticipated.  I provided 20 minutes of non-face-to-face time during this encounter.  HISTORY/CURRENT STATUS: HPI for routine follow-up.  Doing really well overall. Feels like her meds are doing their job.  She has been quilting, watching tv, and reading when she can focus. Energy and motivation are fair to good depending on the day. Unable to work d/t mental health.  No extreme sadness, tearfulness, or feelings of hopelessness at this time, but those things come and go depending on circumstances. She gets sad about her marriage, not what she'd like for it to be. She's trying not to let it bother her and work things out. No abuse.  Sleeps well most of the time. ADLs and personal hygiene are  normal.   Denies any changes in concentration, making decisions, or remembering things.  Appetite has not changed.  Weight is stable.  Still purges, is talking with her counselor about it, having brain spotting.  Denies cutting or any form of self-harm.  Denies suicidal or homicidal thoughts.  Was sick over the holidays, with flu and pneumonia but better now.  Does get anxious sometimes but rarely bad enough to take a Klonopin. Usually it's circumstancial and she can work through it. No PA.   Patient denies increased energy with decreased need for sleep, increased talkativeness, racing thoughts, impulsivity or risky behaviors, increased spending, increased libido, grandiosity, increased irritability or anger, paranoia, or hallucinations.  Denies dizziness, syncope, seizures, numbness, tingling, tremor, tics, unsteady gait, slurred speech, confusion.  No akathisia.  The mirtazapine has really helped that.  Has chronic joint pain. Denies unexplained weight loss, frequent infections, or sores that heal slowly.  No polyphagia, polydipsia, or polyuria. Denies visual changes or paresthesias.   Individual Medical History/ Review of Systems: Changes? :Yes   see HPI     Past medications for mental health diagnoses include: Risperdal, Seroquel, Prozac, Zoloft,  Wellbutrin, Lamictal, Depakote caused hair loss, Xanax, Ambien, trazodone, Trileptal, Luvox, Topamax, Elavil, Pamelor, BuSpar, doxazosin, prazosin, lithium, Vraylar, Abilify, [name], Latuda >60 mg caused abd pain and nausea, propranolol caused diarrhea  Allergies: Codeine, Penicillins, Divalproex sodium, Meloxicam, Sonata [zaleplon], Topamax [topiramate], Aciphex [rabeprazole], Aimovig [erenumab-aooe], and Galcanezumab-gnlm  Current Medications:  Current Outpatient Medications:    albuterol (VENTOLIN HFA) 108 (90 Base) MCG/ACT inhaler, Inhale 2 puffs into the lungs every  6 (six) hours as needed for wheezing or shortness of breath., Disp: 18 g, Rfl:  5   benzonatate (TESSALON) 200 MG capsule, Take 1 capsule (200 mg total) by mouth 2 (two) times daily as needed for cough. Swallow whole do not chew, Disp: 30 capsule, Rfl: 0   Brexpiprazole ([name]) 4 MG TABS, Take 4 mg by mouth in the morning., Disp: 30 tablet, Rfl: 11   clotrimazole (MYCELEX) 10 MG troche, Take 1 tablet (10 mg total) by mouth 5 (five) times daily., Disp: 70 Troche, Rfl: 0   colestipol (COLESTID) 1 g tablet, Take 4 tablets (4 g total) by mouth 2 (two) times daily., Disp: 720 tablet, Rfl: 1   cromolyn (OPTICROM) 4 % ophthalmic solution, Place 1 drop into both eyes daily., Disp: , Rfl:    dicyclomine (BENTYL) 20 MG tablet, Take 1 tablet (20 mg total) by mouth 3 (three) times daily as needed for spasms (Diarrhea)., Disp: 30 tablet, Rfl: 1   Famotidine-Ca Carb-Mag Hydrox (PEPCID COMPLETE PO), Take 1 tablet by mouth daily., Disp: , Rfl:    fluvoxaMINE (LUVOX) 100 MG tablet, Take 4 tablets (400 mg total) by mouth at bedtime., Disp: 360 tablet, Rfl: 1   hydrOXYzine (ATARAX) 25 MG tablet, Take 1-2 tablets (25-50 mg total) by mouth every 8 (eight) hours as needed., Disp: 90 tablet, Rfl: 11   loratadine (CLARITIN) 5 MG chewable tablet, Chew 5 mg by mouth in the morning and at bedtime., Disp: , Rfl:    mirtazapine (REMERON) 15 MG tablet, Take 1 tablet (15 mg total) by mouth at bedtime., Disp: 90 tablet, Rfl: 3   naproxen (NAPROSYN) 500 MG tablet, Take 1,500 mg by mouth as needed for migraine., Disp: , Rfl:    ondansetron (ZOFRAN-ODT) 4 MG disintegrating tablet, Take 1 tablet (4 mg total) by mouth every 8 (eight) hours as needed for nausea or vomiting., Disp: 30 tablet, Rfl: 0   pramipexole (MIRAPEX) 0.5 MG tablet, Take 1 tablet (0.5 mg total) by mouth at bedtime as needed., Disp: 90 tablet, Rfl: 3   RESTASIS 0.05 % ophthalmic emulsion, Place 1 drop into both eyes 2 (two) times daily., Disp: , Rfl:    Rimegepant Sulfate (NURTEC) 75 MG TBDP, Take 75 mg by mouth daily as needed (take for  abortive therapy of migraine, no more than 1 tablet in 24 hours or 8 per month)., Disp: 8 tablet, Rfl: 11   valACYclovir (VALTREX) 500 MG tablet, Take 500 mg by mouth daily., Disp: , Rfl:    gabapentin (NEURONTIN) 600 MG tablet, 2 p.o. every morning, 1 p.o. q. afternoon, 2 p.o. nightly., Disp: 150 tablet, Rfl: 11   prednisoLONE acetate (PRED FORTE) 1 % ophthalmic suspension, Place 1 drop into both eyes daily. (Patient not taking: Reported on 01/29/2022), Disp: , Rfl:  Medication Side Effects: none  Family Medical/ Social History: Changes?  No  MENTAL HEALTH EXAM:  Last menstrual period 10/07/2011.There is no height or weight on file to calculate BMI.  General Appearance: Casual and Well Groomed  Eye Contact:  Good  Speech:  Clear and Coherent and Normal Rate  Volume:  Normal  Mood:  Euthymic  Affect:  Congruent  Thought Process:  Goal Directed and Descriptions of Associations: Circumstantial  Orientation:  Full (Time, Place, and Person)  Thought Content: Logical   Suicidal Thoughts:  No  Homicidal Thoughts:  No  Memory:  WNL  Judgement:  Good  Insight:  Good  Psychomotor Activity:  Normal  Concentration:  Concentration: Good  and Attention Span: Good  Recall:  Good  Fund of Knowledge: Good  Language: Good  Assets:  Desire for Improvement Financial Resources/Insurance Housing Transportation  ADL's:  Intact  Cognition: WNL  Prognosis:  Good   DIAGNOSES:    ICD-10-CM   1. Bipolar I disorder ([place])  F31.9     2. Bulimia  F50.2     3. Obsessive-compulsive disorder, unspecified type  F42.9     4. Generalized anxiety disorder  F41.1     5. Insomnia due to other mental disorder  F51.05    F99       Receiving Psychotherapy: Yes  [facility], and another therapist for brain-spotting  RECOMMENDATIONS:  PDMP was reviewed.  Last Klonopin filled 09/09/2021. I provided 20 minutes of non-face-to-face time during this encounter, including time spent before and after the visit in  records review, medical decision making, counseling pertinent to today's visit, and charting.   As far as her mental health goes she is doing well so no medication changes are needed.  Continue [name] 4 mg, 1 p.o. every morning. Continue Klonopin 0.5 mg, 1 p.o. twice daily prn. Continue Luvox 100 mg, 4 pills nightly. Continue gabapentin 600 mg, 2 qam.,  3 p.o. nightly.  Continue hydroxyzine 25-50 mg, 1 p.o. 3 times daily as needed Continue mirtazapine 15 mg, 1 p.o. nightly  Continue pramipexole 0.5 mg nightly.  Per another provider. Healthy diet and exercise discussed. Continue therapy. Return in 3 months.  [name], PA-C

## 2022-02-04 DIAGNOSIS — F429 Obsessive-compulsive disorder, unspecified: Secondary | ICD-10-CM | POA: Diagnosis not present

## 2022-02-04 DIAGNOSIS — F502 Bulimia nervosa: Secondary | ICD-10-CM | POA: Diagnosis not present

## 2022-02-04 DIAGNOSIS — F4310 Post-traumatic stress disorder, unspecified: Secondary | ICD-10-CM | POA: Diagnosis not present

## 2022-02-04 DIAGNOSIS — F319 Bipolar disorder, unspecified: Secondary | ICD-10-CM | POA: Diagnosis not present

## 2022-02-09 ENCOUNTER — Encounter: Payer: Self-pay | Admitting: Internal Medicine

## 2022-02-09 ENCOUNTER — Ambulatory Visit: Payer: Medicare PPO | Admitting: Internal Medicine

## 2022-02-09 VITALS — BP 114/88 | HR 91 | Ht 64.0 in | Wt 179.0 lb

## 2022-02-09 DIAGNOSIS — F3160 Bipolar disorder, current episode mixed, unspecified: Secondary | ICD-10-CM

## 2022-02-09 DIAGNOSIS — G43809 Other migraine, not intractable, without status migrainosus: Secondary | ICD-10-CM

## 2022-02-09 DIAGNOSIS — B370 Candidal stomatitis: Secondary | ICD-10-CM | POA: Diagnosis not present

## 2022-02-09 DIAGNOSIS — R7303 Prediabetes: Secondary | ICD-10-CM | POA: Diagnosis not present

## 2022-02-09 DIAGNOSIS — K529 Noninfective gastroenteritis and colitis, unspecified: Secondary | ICD-10-CM | POA: Diagnosis not present

## 2022-02-09 DIAGNOSIS — J432 Centrilobular emphysema: Secondary | ICD-10-CM | POA: Diagnosis not present

## 2022-02-09 DIAGNOSIS — E782 Mixed hyperlipidemia: Secondary | ICD-10-CM | POA: Diagnosis not present

## 2022-02-09 DIAGNOSIS — E559 Vitamin D deficiency, unspecified: Secondary | ICD-10-CM

## 2022-02-09 DIAGNOSIS — G2581 Restless legs syndrome: Secondary | ICD-10-CM | POA: Diagnosis not present

## 2022-02-09 MED ORDER — NYSTATIN 100000 UNIT/ML MT SUSP: 5.0000 mL | Freq: Four times a day (QID) | OROMUCOSAL | 0 refills | Status: DC

## 2022-02-09 NOTE — Assessment & Plan Note (Signed)
Mild wheezing, especially in the mornings Lung sounds benign today Started albuterol inhaler as needed for dyspnea or wheezing Needs to abstain from smoking, she agrees.

## 2022-02-09 NOTE — Patient Instructions (Signed)
Please use Nystatin swish and swallow.  Please continue taking other mediations as prescribed.  Please continue to follow low carb diet and perform moderate exercise/walking at least 150 mins/week.

## 2022-02-09 NOTE — Assessment & Plan Note (Signed)
On Nurtec Followed by neurology

## 2022-02-09 NOTE — Assessment & Plan Note (Signed)
Followed by psychiatry On [name], Luvox, gabapentin and Remeron On Atarax as needed

## 2022-02-11 ENCOUNTER — Telehealth: Payer: Self-pay | Admitting: Physician Assistant

## 2022-02-11 DIAGNOSIS — F4310 Post-traumatic stress disorder, unspecified: Secondary | ICD-10-CM | POA: Diagnosis not present

## 2022-02-11 DIAGNOSIS — F319 Bipolar disorder, unspecified: Secondary | ICD-10-CM | POA: Diagnosis not present

## 2022-02-11 DIAGNOSIS — F502 Bulimia nervosa: Secondary | ICD-10-CM | POA: Diagnosis not present

## 2022-02-11 DIAGNOSIS — F429 Obsessive-compulsive disorder, unspecified: Secondary | ICD-10-CM | POA: Diagnosis not present

## 2022-02-11 NOTE — Telephone Encounter (Signed)
Pt LVM reporting Mitazepine not helping her sleep through the night. Only few hours. Can she take Hydroxyzine with Mitazepine or should she increase dose. RTC  [phone]

## 2022-02-11 NOTE — Telephone Encounter (Addendum)
Tried calling 4 times and each time I get a message "Your call cannot be completed as dialed."

## 2022-02-12 NOTE — Telephone Encounter (Signed)
Tried calling patient and I get the same message I got yesterday. I called husband's # and mailbox is full.

## 2022-02-12 NOTE — Telephone Encounter (Signed)
Patient notified

## 2022-02-12 NOTE — Telephone Encounter (Signed)
Patient said she is still not sleeping. She took mirtazapine about 9:30 last night and woke up at 12. When asked about afternoon caffeine use she said she has one soda about 3:00-4:00. She is able to get to sleep, just can't stay asleep. She is asking if she should increase dose or take hydroxyzine with the mirtazapine.   Pharmacy:  [facility]

## 2022-02-12 NOTE — Telephone Encounter (Signed)
No problem. Have her stop caffeine after noon. Ok to take Hydroxyzine 25 mg, 1-2 qhs prn, start with 1 pill to see if that works first.  Administrator, [employer]

## 2022-02-13 DIAGNOSIS — R7303 Prediabetes: Secondary | ICD-10-CM | POA: Insufficient documentation

## 2022-02-13 NOTE — Assessment & Plan Note (Signed)
Lab Results  Component Value Date   HGBA1C 5.6 08/01/2021   Advised to follow low carb diet for now

## 2022-02-13 NOTE — Assessment & Plan Note (Signed)
Well-controlled with Pramipexole

## 2022-02-13 NOTE — Assessment & Plan Note (Signed)
Chronic loose BM Likely IBS-D Needs to take Bentyl as needed for abdominal cramping, may help with diarrhea as well Continue probiotic Followed by GI
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
## 2022-02-13 NOTE — Assessment & Plan Note (Signed)
Persistent despite using clotrimazole troches Started nystatin swish and swallow

## 2022-02-16 ENCOUNTER — Telehealth: Payer: Self-pay | Admitting: Urology

## 2022-02-16 DIAGNOSIS — F319 Bipolar disorder, unspecified: Secondary | ICD-10-CM | POA: Diagnosis not present

## 2022-02-16 DIAGNOSIS — F502 Bulimia nervosa: Secondary | ICD-10-CM | POA: Diagnosis not present

## 2022-02-16 DIAGNOSIS — F4310 Post-traumatic stress disorder, unspecified: Secondary | ICD-10-CM | POA: Diagnosis not present

## 2022-02-16 DIAGNOSIS — F429 Obsessive-compulsive disorder, unspecified: Secondary | ICD-10-CM | POA: Diagnosis not present

## 2022-02-16 NOTE — Telephone Encounter (Signed)
Pt called stating that she wants to cxl her sx on 02/23/22, she made an appt with our call center to have the procedure done in the office due to she doesn't have anyone to take care of her dog. I have informed Dr. Posey Pronto and Caren Griffins with [facility] of this change.

## 2022-02-18 DIAGNOSIS — F502 Bulimia nervosa: Secondary | ICD-10-CM | POA: Diagnosis not present

## 2022-02-18 DIAGNOSIS — F429 Obsessive-compulsive disorder, unspecified: Secondary | ICD-10-CM | POA: Diagnosis not present

## 2022-02-18 DIAGNOSIS — F4310 Post-traumatic stress disorder, unspecified: Secondary | ICD-10-CM | POA: Diagnosis not present

## 2022-02-18 DIAGNOSIS — F319 Bipolar disorder, unspecified: Secondary | ICD-10-CM | POA: Diagnosis not present

## 2022-02-24 ENCOUNTER — Ambulatory Visit: Payer: Medicare PPO | Admitting: Podiatry

## 2022-02-24 DIAGNOSIS — L600 Ingrowing nail: Secondary | ICD-10-CM

## 2022-02-24 NOTE — Patient Instructions (Signed)

## 2022-02-24 NOTE — Progress Notes (Signed)
Subjective:  Patient ID: Jennifer [name], female    DOB: 09-Oct-1965,  MRN: [id number]  Chief Complaint  Patient presents with   Ingrown Toenail    57 y.o. female presents with the above complaint.  Patient presents with bilateral hallux medial border ingrown painful to touch hurts with ambulation worse with pressure she would like to have it removed.  She has not seen anyone else prior to seeing me.  She is not diabetic.  Hurts with ambulation worse with pressure   Review of Systems: Negative except as noted in the HPI. Denies N/V/F/Ch.  Past Medical History:  Diagnosis Date   Abdominal pain, epigastric 10/07/2018   Acute non-recurrent maxillary sinusitis 11/07/2019   Anxiety    Arthritis    Binge-eating and purging type anorexia nervosa    Bipolar affective disorder ([place]) 12/04/2015   Bipolar affective disorder, current episode manic without psychotic symptoms ([place]) 12/04/2015   Bipolar disorder ([place])    Cellulitis and abscess of buttock 02/15/2013   Cystitis, interstitial    Depression    Depression    Phreesia 01/28/2020   Esophageal polyp    about 20 years ago   GERD (gastroesophageal reflux disease)    Left wrist pain 03/22/2019   OCD (obsessive compulsive disorder)    Palpitations 05/25/2014   Rectal pain 12/06/2018   Rib pain on right side 01/31/2019   Rosacea 03/27/2019   Sprain of temporomandibular joint or ligament 01/10/2019    Current Outpatient Medications:    albuterol (VENTOLIN HFA) 108 (90 Base) MCG/ACT inhaler, Inhale 2 puffs into the lungs every 6 (six) hours as needed for wheezing or shortness of breath., Disp: 18 g, Rfl: 5   benzonatate (TESSALON) 200 MG capsule, Take 1 capsule (200 mg total) by mouth 2 (two) times daily as needed for cough. Swallow whole do not chew, Disp: 30 capsule, Rfl: 0   Brexpiprazole ([name]) 4 MG TABS, Take 4 mg by mouth in the morning., Disp: 30 tablet, Rfl: 11   clonazePAM (KLONOPIN) 0.5 MG tablet, Take 0.5 mg by mouth 3  (three) times daily as needed for anxiety., Disp: , Rfl:    cromolyn (OPTICROM) 4 % ophthalmic solution, Place 1 drop into both eyes daily., Disp: , Rfl:    dicyclomine (BENTYL) 20 MG tablet, Take 1 tablet (20 mg total) by mouth 3 (three) times daily as needed for spasms (Diarrhea)., Disp: 30 tablet, Rfl: 1   Famotidine-Ca Carb-Mag Hydrox (PEPCID COMPLETE PO), Take 1 tablet by mouth daily., Disp: , Rfl:    fluvoxaMINE (LUVOX) 100 MG tablet, Take 4 tablets (400 mg total) by mouth at bedtime., Disp: 360 tablet, Rfl: 1   gabapentin (NEURONTIN) 600 MG tablet, 2 p.o. every morning, 1 p.o. q. afternoon, 2 p.o. nightly., Disp: 150 tablet, Rfl: 11   hydrOXYzine (ATARAX) 25 MG tablet, Take 1-2 tablets (25-50 mg total) by mouth every 8 (eight) hours as needed., Disp: 90 tablet, Rfl: 11   loratadine (CLARITIN) 5 MG chewable tablet, Chew 5 mg by mouth in the morning and at bedtime., Disp: , Rfl:    mirtazapine (REMERON) 15 MG tablet, Take 1 tablet (15 mg total) by mouth at bedtime., Disp: 90 tablet, Rfl: 3   naproxen (NAPROSYN) 500 MG tablet, Take 1,500 mg by mouth as needed for migraine., Disp: , Rfl:    nystatin (MYCOSTATIN) 100000 UNIT/ML suspension, Take 5 mLs (500,000 Units total) by mouth 4 (four) times daily. Swish and swallow., Disp: 60 mL, Rfl: 0   ondansetron (ZOFRAN-ODT)  4 MG disintegrating tablet, Take 1 tablet (4 mg total) by mouth every 8 (eight) hours as needed for nausea or vomiting., Disp: 30 tablet, Rfl: 0   pramipexole (MIRAPEX) 0.5 MG tablet, Take 1 tablet (0.5 mg total) by mouth at bedtime as needed., Disp: 90 tablet, Rfl: 3   prednisoLONE acetate (PRED FORTE) 1 % ophthalmic suspension, Place 1 drop into both eyes daily. (Patient not taking: Reported on 01/29/2022), Disp: , Rfl:    Probiotic Product (DAILY PROBIOTIC PO), Take 1 capsule by mouth every other day., Disp: , Rfl:    RESTASIS 0.05 % ophthalmic emulsion, Place 1 drop into both eyes 2 (two) times daily., Disp: , Rfl:    Rimegepant  Sulfate (NURTEC) 75 MG TBDP, Take 75 mg by mouth daily as needed (take for abortive therapy of migraine, no more than 1 tablet in 24 hours or 8 per month)., Disp: 8 tablet, Rfl: 11   valACYclovir (VALTREX) 500 MG tablet, Take 500 mg by mouth daily., Disp: , Rfl:   Social History   Tobacco Use  Smoking Status Former   Packs/day: 1.00   Years: 25.00   Total pack years: 25.00   Types: Cigarettes   Start date: 01/20/1988   Quit date: 11/12/2021   Years since quitting: 0.3  Smokeless Tobacco Never    Allergies  Allergen Reactions   Codeine Shortness Of Breath, Nausea Only and Rash   Penicillins Hives, Shortness Of Breath, Swelling and Rash    Has patient had a PCN reaction causing immediate rash, facial/tongue/throat swelling, SOB or lightheadedness with hypotension: yes Has patient had a PCN reaction causing severe rash involving mucus membranes or skin necrosis: no Has patient had a PCN reaction that required hospitalization: no Has patient had a PCN reaction occurring within the last 10 years: no If all of the above answers are "NO", then may proceed with Cephalosporin use.;      Divalproex Sodium Hives, Itching and Rash   Meloxicam Other (See Comments)    Possible chest tightness - instructed by MD not to take   Sonata [Zaleplon] Other (See Comments)    Hallucinations   Topamax [Topiramate] Other (See Comments)    Low BP and dizziness   Aciphex [Rabeprazole] Swelling   Aimovig [Erenumab-Aooe] Itching   Galcanezumab-Gnlm Rash and Hives   Objective:  There were no vitals filed for this visit. There is no height or weight on file to calculate BMI. Constitutional Well developed. Well nourished.  Vascular Dorsalis pedis pulses palpable bilaterally. Posterior tibial pulses palpable bilaterally. Capillary refill normal to all digits.  No cyanosis or clubbing noted. Pedal hair growth normal.  Neurologic Normal speech. Oriented to person, place, and time. Epicritic sensation  to light touch grossly present bilaterally.  Dermatologic Painful ingrowing nail at medial nail borders of the hallux nail bilaterally. No other open wounds. No skin lesions.  Orthopedic: Normal joint ROM without pain or crepitus bilaterally. No visible deformities. No bony tenderness.   Radiographs: None Assessment:   1. Ingrown left big toenail   2. Ingrown toenail of right foot    Plan:  Patient was evaluated and treated and all questions answered.  Ingrown Nail, bilaterally -Patient elects to proceed with minor surgery to remove ingrown toenail removal today. Consent reviewed and signed by patient. -Ingrown nail excised. See procedure note. -Educated on post-procedure care including soaking. Written instructions provided and reviewed. -Patient to follow up in 2 weeks for nail check.  Procedure: Excision of Ingrown Toenail Location: Bilateral 1st  toe medial nail borders. Anesthesia: Lidocaine 1% plain; 1.5 mL and Marcaine 0.5% plain; 1.5 mL, digital block. Skin Prep: Betadine. Dressing: Silvadene; telfa; dry, sterile, compression dressing. Technique: Following skin prep, the toe was exsanguinated and a tourniquet was secured at the base of the toe. The affected nail border was freed, split with a nail splitter, and excised. Chemical matrixectomy was then performed with phenol and irrigated out with alcohol. The tourniquet was then removed and sterile dressing applied. Disposition: Patient tolerated procedure well. Patient to return in 2 weeks for follow-up.   No follow-ups on file.

## 2022-03-04 DIAGNOSIS — F502 Bulimia nervosa: Secondary | ICD-10-CM | POA: Diagnosis not present

## 2022-03-04 DIAGNOSIS — F429 Obsessive-compulsive disorder, unspecified: Secondary | ICD-10-CM | POA: Diagnosis not present

## 2022-03-04 DIAGNOSIS — F319 Bipolar disorder, unspecified: Secondary | ICD-10-CM | POA: Diagnosis not present

## 2022-03-04 DIAGNOSIS — F4310 Post-traumatic stress disorder, unspecified: Secondary | ICD-10-CM | POA: Diagnosis not present

## 2022-03-10 ENCOUNTER — Ambulatory Visit: Payer: Medicare PPO | Admitting: Podiatry

## 2022-03-10 ENCOUNTER — Ambulatory Visit (INDEPENDENT_AMBULATORY_CARE_PROVIDER_SITE_OTHER): Payer: Medicare PPO | Admitting: Gastroenterology

## 2022-03-10 ENCOUNTER — Encounter: Payer: Self-pay | Admitting: Gastroenterology

## 2022-03-10 VITALS — BP 126/84 | HR 106 | Temp 97.1°F | Ht 64.0 in | Wt 179.5 lb

## 2022-03-10 DIAGNOSIS — L309 Dermatitis, unspecified: Secondary | ICD-10-CM | POA: Diagnosis not present

## 2022-03-10 DIAGNOSIS — R109 Unspecified abdominal pain: Secondary | ICD-10-CM

## 2022-03-10 DIAGNOSIS — R112 Nausea with vomiting, unspecified: Secondary | ICD-10-CM | POA: Diagnosis not present

## 2022-03-10 DIAGNOSIS — K529 Noninfective gastroenteritis and colitis, unspecified: Secondary | ICD-10-CM | POA: Diagnosis not present

## 2022-03-10 DIAGNOSIS — R1110 Vomiting, unspecified: Secondary | ICD-10-CM

## 2022-03-10 NOTE — Progress Notes (Unsigned)
GI Office Note    Referring Provider: Lindell Spar, MD Primary Care Physician:  Lindell Spar, MD Primary Gastroenterologist: [name].Rourk, MD  Date:  03/10/2022  ID:  Jennifer [name], DOB [birth year], MRN [id number]   Chief Complaint   Chief Complaint  Patient presents with   Diarrhea    Patient here today due to issues with diarrhea. Patient says she started a probiotic diarrhea subsided, stopped the colestipol and after a week diarrhea returned. She would like to resume it. She says she is bulimic and is in therapy for it, she says she purged about two weeks ago and she says she noticed involuntary abdominal spasms, which has happened several times and she is worried.     History of Present Illness  Jennifer [name] is a 57 y.o. female with a history of anxiety, GERD, bulimia, fatty liver, depression, bipolar 1 arthritis, and diarrhea presenting today with complaint of diarrhea and abdominal spasms.    Last colonoscopy September 2021: -2 sigmoid polyps -Advised repeat in 10 years   Clinic visit in April 2023 with complaint of diarrhea.  Having 3-4 loose stools per day, small to moderate amounts.  Also with incomplete defecation and occasional urgency.  Denies nocturnal stools.  Abdominal pain relieved with defecation.  Pain primarily periumbilical region and lower abdomen.  Diarrhea made worse with eating.  Had good relief with over-the-counter Imodium.  Negative C. difficile, Giardia.  GI pathogen panel was positive for norovirus.   PCP visit in July again noting chronic diarrhea.  Had some improvement with Bentyl fiber, and probiotic.  Also reported chronic nausea due to bulimia.  Started on cholestyramine 4 g twice daily.   Visit on 10/01/2021, virtual follow-up.  Imodium remained helpful with diarrhea however reported more abdominal pain.  Cholestyramine also somewhat helpful however difficult with twice daily dosing as she reported all contents do not seem to resolve.  Still  having urgency with varying bowel habits.  Sometimes 8-9 stools per day and varying amounts.  Denied nausea, melena, BRBPR, fever, or chills.  Reports scattered bulimic episodes.  Advised Colestid 4 g 2-3 times daily.  Advised to continue Levsin for abdominal pain.  Use Imodium prior to meals if persistent symptoms.  Advised to keep a food and symptom log.  Potential to try Xifaxan in the future if symptoms continue.   She underwent cardiac ablation in October given symptomatic SVT.  Ablation was successful and her metoprolol was discontinued.  She was advised to follow-up in 1 year.   Last office visit 12/22/21. Having a bad migraine that day.  Zofran somewhat helping.  Continue to have issues with purging and continue to see psychiatry and therapy.  Mostly having dry heaves occurring in the mornings most of the week lasting 10 to 15 minutes at a time.  Improves if she lays back down.  Continues to have occasional abdominal pain with diarrhea however felt as though Colestid was working well for her.  Taking it mostly once daily, at times she takes it twice a day it may not be as easy for her to go.  Continues to admit to improvement of bloating if she does not purge.  Advised to continue dicyclomine 20 mg as needed.  Zofran refilled.  Advised to continue Colestid 4 g once to twice daily for diarrhea, also refilled.  Continue to advise famotidine daily and increase to twice daily if needed, continue follow-up with therapy for bulimia/purging, and to avoid lactose products.  Today: Diarrhea: Went to a probiotic and had no diarrhea and after a few days she stopped colestipol and then stopped that for a few weeks and then diarrhea returned.  Thinks she has enough colestipol at home and does not need refills.  Reports with her diarrhea she has bene getting some inflammation and redness to her perineum. Feels warm.   Nausea improved but having more dry heaves than anything. Tries to avoid Zofran but does take  it if severe and if she needs it.   Purged 2 weeks ago and noticed some abdominal spasms. This occurred at time of when she called for an appointment. Has weekly therapy and monthly brain spotting and reports she has a plan in place. Doing a little better with that. Came because she was concerned regarding the abdominal spasm she was having. Spasm lasted for about 20 seconds but felt nauseas at that time. Has dicyclomine at home. Stomach does not hurt as bad since being on the probiotic.   Did purge last night and did not have any spasms and notices when she does not b[urge that her bloating is improved.    Current Outpatient Medications  Medication Sig Dispense Refill   albuterol (VENTOLIN HFA) 108 (90 Base) MCG/ACT inhaler Inhale 2 puffs into the lungs every 6 (six) hours as needed for wheezing or shortness of breath. 18 g 5   Brexpiprazole ([name]) 4 MG TABS Take 4 mg by mouth in the morning. 30 tablet 11   clonazePAM (KLONOPIN) 0.5 MG tablet Take 0.5 mg by mouth 3 (three) times daily as needed for anxiety.     colestipol (COLESTID) 1 g tablet Take 4 g by mouth 2 (two) times daily.     cromolyn (OPTICROM) 4 % ophthalmic solution Place 1 drop into both eyes daily.     dicyclomine (BENTYL) 20 MG tablet Take 1 tablet (20 mg total) by mouth 3 (three) times daily as needed for spasms (Diarrhea). 30 tablet 1   Famotidine-Ca Carb-Mag Hydrox (PEPCID COMPLETE PO) Take 1 tablet by mouth daily.     fluvoxaMINE (LUVOX) 100 MG tablet Take 4 tablets (400 mg total) by mouth at bedtime. 360 tablet 1   gabapentin (NEURONTIN) 600 MG tablet 2 p.o. every morning, 1 p.o. q. afternoon, 2 p.o. nightly. 150 tablet 11   hydrOXYzine (ATARAX) 25 MG tablet Take 1-2 tablets (25-50 mg total) by mouth every 8 (eight) hours as needed. 90 tablet 11   Loratadine 10 MG CAPS Take 10 mg by mouth in the morning and at bedtime.     mirtazapine (REMERON) 15 MG tablet Take 1 tablet (15 mg total) by mouth at bedtime. 90 tablet 3    naproxen (NAPROSYN) 500 MG tablet Take 1,500 mg by mouth as needed for migraine.     ondansetron (ZOFRAN-ODT) 4 MG disintegrating tablet Take 1 tablet (4 mg total) by mouth every 8 (eight) hours as needed for nausea or vomiting. 30 tablet 0   pramipexole (MIRAPEX) 0.5 MG tablet Take 1 tablet (0.5 mg total) by mouth at bedtime as needed. 90 tablet 3   Probiotic Product (DAILY PROBIOTIC PO) Take 1 capsule by mouth daily at 6 (six) AM.     RESTASIS 0.05 % ophthalmic emulsion Place 1 drop into both eyes 2 (two) times daily.     Rimegepant Sulfate (NURTEC) 75 MG TBDP Take 75 mg by mouth daily as needed (take for abortive therapy of migraine, no more than 1 tablet in 24 hours or 8 per month).  8 tablet 11   valACYclovir (VALTREX) 500 MG tablet Take 500 mg by mouth daily.     No current facility-administered medications for this visit.    Past Medical History:  Diagnosis Date   Abdominal pain, epigastric 10/07/2018   Acute non-recurrent maxillary sinusitis 11/07/2019   Anxiety    Arthritis    Binge-eating and purging type anorexia nervosa    Bipolar affective disorder ([place]) 12/04/2015   Bipolar affective disorder, current episode manic without psychotic symptoms ([place]) 12/04/2015   Bipolar disorder ([place])    Cellulitis and abscess of buttock 02/15/2013   Cystitis, interstitial    Depression    Depression    Phreesia 01/28/2020   Esophageal polyp    about 20 years ago   GERD (gastroesophageal reflux disease)    Left wrist pain 03/22/2019   OCD (obsessive compulsive disorder)    Palpitations 05/25/2014   Rectal pain 12/06/2018   Rib pain on right side 01/31/2019   Rosacea 03/27/2019   Sprain of temporomandibular joint or ligament 01/10/2019    Past Surgical History:  Procedure Laterality Date   BLADDER SURGERY     CHOLECYSTECTOMY     COLONOSCOPY WITH PROPOFOL N/A 10/05/2019   Procedure: COLONOSCOPY WITH PROPOFOL;  Surgeon: Daneil Dolin, MD;  [facility];  Service:  Endoscopy;  Laterality: N/A;  12:45pm   COSMETIC SURGERY N/A    Phreesia 01/28/2020   ESOPHAGOGASTRODUODENOSCOPY  09/28/2016   [facility]; Dr. Therisa Doyne; erosions in the esophagus, 5 cm hiatal hernia, nonbleeding erosive gastropathy s/p biopsied, normal duodenum.  Path with chronic inactive gastritis, no H. pylori or intestinal metaplasia.    ESOPHAGOGASTRODUODENOSCOPY (EGD) WITH PROPOFOL N/A 10/17/2018   Dr. Gala Romney: mild reflux esophagitis, small hiatal hernia   POLYPECTOMY  10/05/2019   Procedure: POLYPECTOMY;  Surgeon: Daneil Dolin, MD;  Location: [facility];  Service: Endoscopy;;   SVT ABLATION N/A 10/31/2021   Procedure: SVT ABLATION;  Surgeon: Melida Quitter, MD;  Location: [place] [facility];  Service: Cardiovascular;  Laterality: N/A;   VOCAL CORD LATERALIZATION, ENDOSCOPIC APPROACH W/ MLB      Family History  Problem Relation Age of Onset   Healthy Mother    Healthy Father    Migraines Paternal Grandmother    Colon cancer Neg Hx    Colon polyps Neg Hx     Allergies as of 03/10/2022 - Review Complete 03/10/2022  Allergen Reaction Noted   Codeine Shortness Of Breath, Nausea Only, and Rash 02/15/2013   Penicillins Hives, Shortness Of Breath, Swelling, and Rash 10/09/2011   Divalproex sodium Hives, Itching, and Rash 01/28/2020   Meloxicam Other (See Comments) 05/08/2014   Sonata [zaleplon] Other (See Comments) 10/22/2017   Topamax [topiramate] Other (See Comments) 10/04/2018   Aciphex [rabeprazole] Swelling 07/16/2020   Aimovig [erenumab-aooe] Itching 06/26/2019   Galcanezumab-gnlm Rash and Hives 10/04/2018    Social History   Socioeconomic History   Marital status: Married    Spouse name: Todd   Number of children: 0   Years of education: Not on file   Highest education level: Not on file  Occupational History   Not on file  Tobacco Use   Smoking status: Former    Packs/day: 1.00    Years: 25.00    Total pack years: 25.00    Types: Cigarettes    Start date:  01/20/1988    Quit date: 11/12/2021    Years since quitting: 0.3   Smokeless tobacco: Never  Vaping Use  Vaping Use: Never used  Substance and Sexual Activity   Alcohol use: No    Alcohol/week: 0.0 standard drinks of alcohol   Drug use: No   Sexual activity: Yes    Birth control/protection: None  Other Topics Concern   Not on file  Social History Narrative   Lives with husband -[name] of 24 years       [place]- [place]      Enjoys: reading-all genres      Diet: eats all food groups   Caffeine: 1 cup daily at times, diet dr pepper daily   Water: gatorade  zero and water       Wears seat belt    Does not use phone while driving   Smoke and Development worker, 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         Social Determinants of Health   Financial Resource Strain: Low Risk  (10/16/2021)   Overall Financial Resource Strain (CARDIA)    Difficulty of Paying Living Expenses: Not hard at all  Food Insecurity: No Food Insecurity (10/16/2021)   Hunger Vital Sign    Worried About Running Out of Food in the Last Year: Never true    [place] in the Last Year: Never true  Transportation Needs: No Transportation Needs (10/16/2021)   PRAPARE - Hydrologist (Medical): No    Lack of Transportation (Non-Medical): No  Physical Activity: Inactive (10/16/2021)   Exercise Vital Sign    Days of Exercise per Week: 0 days    Minutes of Exercise per Session: 0 min  Stress: Stress Concern Present (10/16/2021)   [place]    Feeling of Stress : Very much  Social Connections: Socially Integrated (10/16/2021)   Social Connection and Isolation Panel [NHANES]    Frequency of Communication with Friends and Family: More than three times a week    Frequency of Social Gatherings with Friends and Family: Once a week    Attends Religious Services: More than 4 times per year    Active Member of [employer] or  Organizations: Yes    Attends Music therapist: More than 4 times per year    Marital Status: Married     Review of Systems   Gen: Denies fever, chills, anorexia. Denies fatigue, weakness, weight loss.  CV: Denies chest pain, palpitations, syncope, peripheral edema, and claudication. Resp: Denies dyspnea at rest, cough, wheezing, coughing up blood, and pleurisy. GI: See HPI Derm: Denies rash, itching, dry skin Psych: Denies depression, anxiety, memory loss, confusion. No homicidal or suicidal ideation.  Heme: Denies bruising, bleeding, and enlarged lymph nodes.   Physical Exam   BP 126/84 (BP Location: Left Arm, Patient Position: Sitting, Cuff Size: Large)   Pulse (!) 106   Temp (!) 97.1 F (36.2 C) (Temporal)   Ht 5' 4"$  (1.626 m)   Wt 179 lb 8 oz (81.4 kg)   LMP 10/07/2011   BMI 30.81 kg/m   General:   Alert and oriented. No distress noted. Pleasant and cooperative.  Head:  Normocephalic and atraumatic. Eyes:  Conjuctiva clear without scleral icterus. Mouth:  Oral mucosa pink and moist. Good dentition. No lesions. Lungs:  Clear to auscultation bilaterally. No wheezes, rales, or rhonchi. No distress.  Heart:  S1, S2 present without murmurs appreciated.  Abdomen:  +BS, soft, non-tender and non-distended. No rebound or guarding. No HSM or masses noted.  Rectal: deferred Msk:  Symmetrical without gross deformities. Normal posture. Extremities:  Without edema. Neurologic:  Alert and  oriented x4 Psych:  Alert and cooperative. Normal mood and affect.   Assessment  BLEN HERTER is a 57 y.o. female with a history of anxiety, GERD, bulimia, fatty liver, depression, bipolar 1 arthritis, and diarrhea presenting today with complaint of abdominal spasms and diarrhea.   Nausea, dry heaves, abdominal spasm: Chronic history given bulimia, currently undergoing therapy and brain spotting per patient.  Has more dry heaves and nausea/vomiting.  Did admit to purging last  night.  Has associated bloating with purging.  When she goes without purging her bloating tends to be better.  Reported 2 weeks ago she had more frequent purging and afterwards noticed about 20 seconds of abdominal spasms that she was able to visibly see with some associated nausea.  Has purged since then and has not experienced any abdominal spasms.  Advised that she may use the dicyclomine as needed for this, suspect this was a reaction to significant use of abdominal muscles with purging and possible dehydration.  Also encouraged her to keep hydrated and refrain from purging by continuing therapy.  Advised her that she may continue Zofran as needed as well.  GERD: Controlled with famotidine once daily.  Chronic diarrhea, perineal erythema: Had tried a probiotic and had improvement in her diarrhea therefore given she was on probiotic she decided to stop colestipol and then a few weeks later her diarrhea returned.  Feels as though she may need this long-term and has had some improvement since starting it back.  Does states she tends to sometimes forget a dose and gets scared to take it a second time.  We discussed that taking it twice daily is okay but if she does experience some constipation she can skip a few doses.  Advised to continue avoiding lactose products.  During her return of more significant diarrhea she likely has had some perineal erythema related to this and frequent wiping.  Advise she may use a zinc cream to the perineal region such as Desitin to help with irritation.  PLAN   Desitin as needed for perineal irritation.  Continue colestipol 4g once to twice daily. Continue dicyclomine 20 mg as needed.  Famotidine once daily.  Zofran 72m every 8 hours as needed.  Continue to avoid lactose.  Continue with therapy.  Follow up in 6 months.     CVenetia Night MSN, FNP-BC, AGACNP-BC [facility]

## 2022-03-10 NOTE — Patient Instructions (Signed)
Use Desitin for perineal irritation that is likely due to diarrhea.   Continue colestipol 4g once to twice daily.   Continue dicyclomine 20 mg up to three times daily as needed.  Continue to avoid lactose and continue with therapy.   Follow up in 6 months, sooner if needed.   It was a pleasure to see you today. I want to create trusting relationships with patients. If you receive a survey regarding your visit,  I greatly appreciate you taking time to fill this out on paper or through your MyChart. I value your feedback.  [name], MSN, FNP-BC, AGACNP-BC [facility]

## 2022-03-13 DIAGNOSIS — F319 Bipolar disorder, unspecified: Secondary | ICD-10-CM | POA: Diagnosis not present

## 2022-03-13 DIAGNOSIS — F502 Bulimia nervosa: Secondary | ICD-10-CM | POA: Diagnosis not present

## 2022-03-13 DIAGNOSIS — F4310 Post-traumatic stress disorder, unspecified: Secondary | ICD-10-CM | POA: Diagnosis not present

## 2022-03-13 DIAGNOSIS — F429 Obsessive-compulsive disorder, unspecified: Secondary | ICD-10-CM | POA: Diagnosis not present

## 2022-03-18 ENCOUNTER — Other Ambulatory Visit: Payer: Self-pay | Admitting: Gastroenterology

## 2022-03-18 DIAGNOSIS — F319 Bipolar disorder, unspecified: Secondary | ICD-10-CM | POA: Diagnosis not present

## 2022-03-18 DIAGNOSIS — F4310 Post-traumatic stress disorder, unspecified: Secondary | ICD-10-CM | POA: Diagnosis not present

## 2022-03-18 DIAGNOSIS — F429 Obsessive-compulsive disorder, unspecified: Secondary | ICD-10-CM | POA: Diagnosis not present

## 2022-03-18 DIAGNOSIS — F502 Bulimia nervosa: Secondary | ICD-10-CM | POA: Diagnosis not present

## 2022-03-18 MED ORDER — SUCRALFATE 1 GM/10ML PO SUSP: 1.0000 g | Freq: Four times a day (QID) | ORAL | 0 refills | Status: DC | PRN

## 2022-03-19 ENCOUNTER — Encounter: Payer: Self-pay | Admitting: Radiology

## 2022-03-24 ENCOUNTER — Ambulatory Visit
Admission: EM | Admit: 2022-03-24 | Discharge: 2022-03-24 | Disposition: A | Discharge status: Home / Self Care | Payer: Medicare PPO | Attending: Family Medicine | Admitting: Family Medicine

## 2022-03-24 ENCOUNTER — Encounter: Payer: Self-pay | Admitting: Emergency Medicine

## 2022-03-24 ENCOUNTER — Other Ambulatory Visit: Payer: Self-pay

## 2022-03-24 DIAGNOSIS — S39012A Strain of muscle, fascia and tendon of lower back, initial encounter: Secondary | ICD-10-CM | POA: Diagnosis not present

## 2022-03-24 HISTORY — DX: Emphysema, unspecified: J43.9

## 2022-03-24 MED ORDER — TIZANIDINE HCL 4 MG PO CAPS: 4.0000 mg | ORAL_CAPSULE | Freq: Three times a day (TID) | ORAL | 0 refills | Status: DC | PRN

## 2022-03-24 MED ORDER — KETOROLAC TROMETHAMINE 30 MG/ML IJ SOLN
30.0000 mg | Freq: Once | INTRAMUSCULAR | Status: AC
  Administered 2022-03-24: 30 mg via INTRAMUSCULAR

## 2022-03-24 NOTE — ED Triage Notes (Addendum)
Pt reports lower back pain since this am. Pt denies any known injury.

## 2022-03-24 NOTE — ED Provider Notes (Signed)
[facility]    CSN: [id number] Arrival date & time: 03/24/22  [place]      History   Chief Complaint Chief Complaint  Patient presents with   Back Pain    HPI Jennifer [name] is a 57 y.o. female.   Patient presenting today with new onset bilateral low back pain that started upon waking this morning and has persisted throughout the day.  She describes it as dull and aching in nature and worse with pressure applied to the area.  Denies any known injury, radiation of pain down legs, numbness, tingling, bowel or bladder incontinence, saddle anesthesias, fevers, urinary symptoms.  Took a few Aleve earlier today with only mild relief.    Past Medical History:  Diagnosis Date   Abdominal pain, epigastric 10/07/2018   Acute non-recurrent maxillary sinusitis 11/07/2019   Anxiety    Arthritis    Binge-eating and purging type anorexia nervosa    Bipolar affective disorder ([place]) 12/04/2015   Bipolar affective disorder, current episode manic without psychotic symptoms ([place]) 12/04/2015   Bipolar disorder ([place])    Cellulitis and abscess of buttock 02/15/2013   Cystitis, interstitial    Depression    Depression    Phreesia 01/28/2020   Emphysema lung (HCC)    Esophageal polyp    about 20 years ago   GERD (gastroesophageal reflux disease)    Left wrist pain 03/22/2019   OCD (obsessive compulsive disorder)    Palpitations 05/25/2014   Rectal pain 12/06/2018   Rib pain on right side 01/31/2019   Rosacea 03/27/2019   Sprain of temporomandibular joint or ligament 01/10/2019    Patient Active Problem List   Diagnosis Date Noted   Prediabetes 02/13/2022   Centrilobular emphysema ([place]) 12/17/2021   Tobacco abuse 12/17/2021   Hospital discharge follow-up 09/17/2021   SVT (supraventricular tachycardia) 09/11/2021   Elevated troponin 09/10/2021   Encounter for general adult medical examination with abnormal findings 08/08/2021   Chronic diarrhea 08/08/2021   Fissure in skin  of foot 08/08/2021   Allergic rhinitis 02/28/2021   Hyperkeratotic fissured eczema of hand 01/21/2021   [place] 12/30/2020   PVD (peripheral vascular disease) ([place]) 12/30/2020   Sleep related bruxism 12/30/2020   Thrombocytopenia ([place]) 11/29/2020   Leg swelling 11/29/2020   Left hip pain 11/19/2020   Easy bruising 08/19/2020   Peripheral neuropathy 08/19/2020   Restless leg syndrome 06/13/2020   Tick bite of back 06/13/2020   Neck pain on right side 03/14/2020   Oral candidiasis 01/10/2019   Bulimia 09/12/2018   Gastroesophageal reflux disease 09/12/2018   Migraine without status migrainosus, not intractable 09/12/2018   Mixed bipolar I disorder ([place]) 12/05/2015   OCD (obsessive compulsive disorder) 12/04/2015    Past Surgical History:  Procedure Laterality Date   BLADDER SURGERY     CHOLECYSTECTOMY     COLONOSCOPY WITH PROPOFOL N/A 10/05/2019   Procedure: COLONOSCOPY WITH PROPOFOL;  Surgeon: Daneil Dolin, MD;  [facility];  Service: Endoscopy;  Laterality: N/A;  12:45pm   COSMETIC SURGERY N/A    Phreesia 01/28/2020   ESOPHAGOGASTRODUODENOSCOPY  09/28/2016   [facility]; Dr. Therisa Doyne; erosions in the esophagus, 5 cm hiatal hernia, nonbleeding erosive gastropathy s/p biopsied, normal duodenum.  Path with chronic inactive gastritis, no H. pylori or intestinal metaplasia.    ESOPHAGOGASTRODUODENOSCOPY (EGD) WITH PROPOFOL N/A 10/17/2018   Dr. Gala Romney: mild reflux esophagitis, small hiatal hernia   POLYPECTOMY  10/05/2019   Procedure: POLYPECTOMY;  Surgeon: Daneil Dolin, MD;  Location: [facility];  Service: Endoscopy;;   SVT ABLATION N/A 10/31/2021   Procedure: SVT ABLATION;  Surgeon: Myles Gip, [name], MD;  Location: [facility];  Service: Cardiovascular;  Laterality: N/A;   VOCAL CORD LATERALIZATION, ENDOSCOPIC APPROACH W/ MLB      OB History     Gravida  3   Para      Term      Preterm      AB  3   Living  0      SAB  1   IAB  2   Ectopic       Multiple      Live Births               Home Medications    Prior to Admission medications   Medication Sig Start Date End Date Taking? Authorizing Provider  tiZANidine (ZANAFLEX) 4 MG capsule Take 1 capsule (4 mg total) by mouth 3 (three) times daily as needed for muscle spasms. Do not drink alcohol or drive while taking this medication.  May cause drowsiness. 03/24/22  Yes Volney American, PA-C  albuterol (VENTOLIN HFA) 108 (90 Base) MCG/ACT inhaler Inhale 2 puffs into the lungs every 6 (six) hours as needed for wheezing or shortness of breath. 12/17/21   Lindell Spar, MD  Brexpiprazole ([name]) 4 MG TABS Take 4 mg by mouth in the morning. 12/16/21   [name] T, PA-C  clonazePAM (KLONOPIN) 0.5 MG tablet Take 0.5 mg by mouth 3 (three) times daily as needed for anxiety.    [provider]  colestipol (COLESTID) 1 g tablet Take 4 g by mouth 2 (two) times daily.    [provider]  cromolyn (OPTICROM) 4 % ophthalmic solution Place 1 drop into both eyes daily. 11/16/20   [provider]  dicyclomine (BENTYL) 20 MG tablet Take 1 tablet (20 mg total) by mouth 3 (three) times daily as needed for spasms (Diarrhea). 07/31/21   Lindell Spar, MD  Famotidine-Ca Carb-Mag Hydrox (PEPCID COMPLETE PO) Take 1 tablet by mouth daily.    [provider]  fluvoxaMINE (LUVOX) 100 MG tablet Take 4 tablets (400 mg total) by mouth at bedtime. 12/16/21   [name] T, PA-C  gabapentin (NEURONTIN) 600 MG tablet 2 p.o. every morning, 1 p.o. q. afternoon, 2 p.o. nightly. 01/29/22   Addison Lank, PA-C  hydrOXYzine (ATARAX) 25 MG tablet Take 1-2 tablets (25-50 mg total) by mouth every 8 (eight) hours as needed. 12/16/21   [name] T, PA-C  Loratadine 10 MG CAPS Take 10 mg by mouth in the morning and at bedtime.    [provider]  mirtazapine (REMERON) 15 MG tablet Take 1 tablet (15 mg total) by mouth at bedtime. 07/08/21   [name] T,  PA-C  naproxen (NAPROSYN) 500 MG tablet Take 1,500 mg by mouth as needed for migraine.    [provider]  ondansetron (ZOFRAN-ODT) 4 MG disintegrating tablet Take 1 tablet (4 mg total) by mouth every 8 (eight) hours as needed for nausea or vomiting. 12/22/21   Sherron Monday, NP  pramipexole (MIRAPEX) 0.5 MG tablet Take 1 tablet (0.5 mg total) by mouth at bedtime as needed. 11/12/21   Lomax, Amy, NP  Probiotic Product (DAILY PROBIOTIC PO) Take 1 capsule by mouth daily at 6 (six) AM.    [provider]  RESTASIS 0.05 % ophthalmic emulsion Place 1 drop into both eyes 2 (two) times daily.  11/11/20   [provider]  Rimegepant Sulfate (NURTEC) 75 MG TBDP Take 75 mg by mouth daily as needed (take for abortive therapy of migraine, no more than 1 tablet in 24 hours or 8 per month). 11/12/21   Lomax, Amy, NP  sucralfate (CARAFATE) 1 GM/10ML suspension Take 10 mLs (1 g total) by mouth 4 (four) times daily as needed for up to 14 days. 03/18/22 04/01/22  Sherron Monday, NP  valACYclovir (VALTREX) 500 MG tablet Take 500 mg by mouth daily. 01/03/18   [provider]    Family History Family History  Problem Relation Age of Onset   Healthy Mother    Healthy Father    Migraines Paternal Grandmother    Colon cancer Neg Hx    Colon polyps Neg Hx     Social History Social History   Tobacco Use   Smoking status: Former    Packs/day: 1.00    Years: 25.00    Total pack years: 25.00    Types: Cigarettes    Start date: 01/20/1988    Quit date: 11/12/2021    Years since quitting: 0.3   Smokeless tobacco: Never  Vaping Use   Vaping Use: Never used  Substance Use Topics   Alcohol use: No    Alcohol/week: 0.0 standard drinks of alcohol   Drug use: No     Allergies   Codeine, Penicillins, Divalproex sodium, Meloxicam, Sonata [zaleplon], Topamax [topiramate], Aciphex [rabeprazole], Aimovig [erenumab-aooe], and Galcanezumab-gnlm   Review of Systems Review of  Systems Per HPI  Physical Exam Triage Vital Signs ED Triage Vitals  Enc Vitals Group     BP 03/24/22 1933 (!) 128/94     Pulse Rate 03/24/22 1933 76     Resp 03/24/22 1933 20     Temp 03/24/22 1933 97.8 F (36.6 C)     Temp Source 03/24/22 1933 Oral     SpO2 03/24/22 1933 95 %     Weight --      Height --      Head Circumference --      Peak Flow --      Pain Score 03/24/22 1932 8     Pain Loc --      Pain Edu? --      Excl. in [place]? --    No data found.  Updated Vital Signs BP (!) 128/94 (BP Location: Right Arm)   Pulse 76   Temp 97.8 F (36.6 C) (Oral)   Resp 20   LMP 10/07/2011   SpO2 95%   Visual Acuity Right Eye Distance:   Left Eye Distance:   Bilateral Distance:    Right Eye Near:   Left Eye Near:    Bilateral Near:     Physical Exam Vitals and nursing note reviewed.  Constitutional:      Appearance: Normal appearance. She is not ill-appearing.  HENT:     Head: Atraumatic.     Mouth/Throat:     Mouth: Mucous membranes are moist.     Pharynx: Oropharynx is clear.  Eyes:     Extraocular Movements: Extraocular movements intact.     Conjunctiva/sclera: Conjunctivae normal.  Cardiovascular:     Rate and Rhythm: Normal rate and regular rhythm.     Heart sounds: Normal heart sounds.  Pulmonary:     Effort: Pulmonary effort is normal.     Breath sounds: Normal breath sounds.  Musculoskeletal:        General: Tenderness present. No swelling, deformity  or signs of injury. Normal range of motion.     Cervical back: Normal range of motion and neck supple.     Comments: Bilateral lumbar musculature tender to palpation, no midline spinal tenderness to palpation diffusely, negative straight leg raise bilateral lower extremities, normal gait and range of motion  Skin:    General: Skin is warm and dry.     Findings: No bruising or erythema.  Neurological:     Mental Status: She is alert and oriented to person, place, and time.     Motor: No weakness.      Gait: Gait normal.     Comments: Bilateral lower extremities neurovascularly intact  Psychiatric:        Mood and Affect: Mood normal.        Thought Content: Thought content normal.        Judgment: Judgment normal.      UC Treatments / Results  Labs (all labs ordered are listed, but only abnormal results are displayed) Labs Reviewed - No data to display  EKG   Radiology No results found.  Procedures Procedures (including critical care time)  Medications Ordered in UC Medications  ketorolac (TORADOL) 30 MG/ML injection 30 mg (30 mg Intramuscular Given 03/24/22 2006)    Initial Impression / Assessment and Plan / UC Course  I have reviewed the triage vital signs and the nursing notes.  Pertinent labs & imaging results that were available during my care of the patient were reviewed by me and considered in my medical decision making (see chart for details).     Consistent with lumbar strain, treat with IM Toradol, Zanaflex, heat, massage, stretches, rest.  Return for worsening symptoms.  Final Clinical Impressions(s) / UC Diagnoses   Final diagnoses:  Strain of lumbar region, initial encounter   Discharge Instructions   None    ED Prescriptions     Medication Sig Dispense Auth. Provider   tiZANidine (ZANAFLEX) 4 MG capsule Take 1 capsule (4 mg total) by mouth 3 (three) times daily as needed for muscle spasms. Do not drink alcohol or drive while taking this medication.  May cause drowsiness. 15 capsule Volney American, [place]      PDMP not reviewed this encounter.   Volney American, [place] 03/24/22 2028

## 2022-03-25 ENCOUNTER — Other Ambulatory Visit: Payer: Self-pay

## 2022-03-25 ENCOUNTER — Emergency Department (HOSPITAL_COMMUNITY)
Admission: EM | Admit: 2022-03-25 | Discharge: 2022-03-25 | Disposition: A | Discharge status: Home / Self Care | Payer: Medicare PPO | Attending: Emergency Medicine | Admitting: Emergency Medicine

## 2022-03-25 ENCOUNTER — Encounter (HOSPITAL_COMMUNITY): Payer: Self-pay | Admitting: *Deleted

## 2022-03-25 ENCOUNTER — Emergency Department (HOSPITAL_COMMUNITY): Payer: Medicare PPO

## 2022-03-25 ENCOUNTER — Ambulatory Visit: Payer: Medicare PPO

## 2022-03-25 ENCOUNTER — Ambulatory Visit
Admission: EM | Admit: 2022-03-25 | Discharge: 2022-03-25 | Disposition: A | Discharge status: Home / Self Care | Payer: Medicare PPO

## 2022-03-25 DIAGNOSIS — R1013 Epigastric pain: Secondary | ICD-10-CM | POA: Diagnosis not present

## 2022-03-25 DIAGNOSIS — R109 Unspecified abdominal pain: Secondary | ICD-10-CM | POA: Diagnosis not present

## 2022-03-25 LAB — COMPREHENSIVE METABOLIC PANEL
ALT: 34 U/L (ref 0–44)
AST: 30 U/L (ref 15–41)
Albumin: 4.1 g/dL (ref 3.5–5.0)
Alkaline Phosphatase: 83 U/L (ref 38–126)
Anion gap: 8 (ref 5–15)
BUN: 18 mg/dL (ref 6–20)
CO2: 24 mmol/L (ref 22–32)
Calcium: 8.9 mg/dL (ref 8.9–10.3)
Chloride: 105 mmol/L (ref 98–111)
Creatinine, Ser: 0.95 mg/dL (ref 0.44–1.00)
GFR, Estimated: >60 mL/min (ref >60)
Glucose, Bld: 108 mg/dL — ABNORMAL HIGH (ref 70–99)
Potassium: 4.4 mmol/L (ref 3.5–5.1)
Sodium: 137 mmol/L (ref 135–145)
Total Bilirubin: 0.4 mg/dL (ref 0.3–1.2)
Total Protein: 6.9 g/dL (ref 6.5–8.1)

## 2022-03-25 LAB — CBC
HCT: 40.7 % (ref 36.0–46.0)
Hemoglobin: 13.4 g/dL (ref 12.0–15.0)
MCH: 30.4 pg (ref 26.0–34.0)
MCHC: 32.9 g/dL (ref 30.0–36.0)
MCV: 92.3 fL (ref 80.0–100.0)
Platelets: 159 10*3/uL (ref 150–400)
RBC: 4.41 MIL/uL (ref 3.87–5.11)
RDW: 13.2 % (ref 11.5–15.5)
WBC: 6.4 10*3/uL (ref 4.0–10.5)
nRBC: 0 % (ref 0.0–0.2)

## 2022-03-25 LAB — URINALYSIS, ROUTINE W REFLEX MICROSCOPIC
Bilirubin Urine: NEGATIVE
Glucose, UA: NEGATIVE mg/dL
Hgb urine dipstick: NEGATIVE
Ketones, ur: NEGATIVE mg/dL
Leukocytes,Ua: NEGATIVE
Nitrite: NEGATIVE
Protein, ur: NEGATIVE mg/dL
Specific Gravity, Urine: 1.005 (ref 1.005–1.030)
pH: 7 (ref 5.0–8.0)

## 2022-03-25 LAB — LIPASE, BLOOD: Lipase: 40 U/L (ref 11–51)

## 2022-03-25 MED ORDER — ALUM & MAG HYDROXIDE-SIMETH 200-200-20 MG/5ML PO SUSP
30.0000 mL | Freq: Once | ORAL | Status: AC
  Administered 2022-03-25: 30 mL via ORAL
  Filled 2022-03-25: qty 30

## 2022-03-25 MED ORDER — LIDOCAINE VISCOUS HCL 2 % MT SOLN
15.0000 mL | Freq: Once | OROMUCOSAL | Status: AC
  Administered 2022-03-25: 15 mL via ORAL
  Filled 2022-03-25: qty 15

## 2022-03-25 NOTE — ED Triage Notes (Signed)
Pt reports epigastric pain since last night. Pt reports she has bulimia and hiatal hernia and purge lats night. Reports pain improves when she lay down.

## 2022-03-25 NOTE — ED Notes (Signed)
Pt awaiting dispo

## 2022-03-25 NOTE — Discharge Instructions (Signed)
Please go to ED for further evaluation of your abdominal pain

## 2022-03-25 NOTE — ED Notes (Signed)
Patient is being discharged from the Urgent Care and sent to the Emergency Department via private Vehicle . Per NP, patient is in need of higher level of care due to ABD Pain. Patient is aware and verbalizes understanding of plan of care.  Vitals:   03/25/22 1014  BP: (!) 152/101  Pulse: 81  Resp: 18  Temp: 97.7 F (36.5 C)  SpO2: 96%

## 2022-03-25 NOTE — ED Triage Notes (Addendum)
Pt with severe abd pain, worse with sitting up.  Pt seen at UC this morning and sent here. Pt states she vomited x 3 that was induced by pt last night ( pt with hx of bulimia). States chronic diarrhea. Pt states she has a hiatal hernia.

## 2022-03-25 NOTE — ED Notes (Signed)
Pt states she is pain free.  That the pain left when she went aseep

## 2022-03-25 NOTE — ED Provider Notes (Signed)
 [place] [facility] AT [facility] Provider Note   CSN: [id number] Arrival date & time: 03/25/22  1055     History Chief Complaint  Patient presents with   Abdominal Pain    Jennifer [name] is a 57 y.o. female.  Patient with past history significant for bulimia and hiatal hernia presents to the ED for abdominal pain. Reports that she was seen in UC yesterday for back pain and given toradol IM. Now reporting that she is experiencing epigastric pain and is concerned that this is due to her hiatal hernia as she had 3 episodes of vomiting last night. Most recently seen by Brooke Bonito, NP with gastroenterology.    Abdominal Pain      Home Medications Prior to Admission medications   Medication Sig Start Date End Date Taking? Authorizing Provider  albuterol (VENTOLIN HFA) 108 (90 Base) MCG/ACT inhaler Inhale 2 puffs into the lungs every 6 (six) hours as needed for wheezing or shortness of breath. 12/17/21  Yes Patel, Earlie Lou, MD  Brexpiprazole ([name]) 4 MG TABS Take 4 mg by mouth in the morning. 12/16/21  Yes Hurst, Rosey Bath T, PA-C  clonazePAM (KLONOPIN) 0.5 MG tablet Take 0.5 mg by mouth 3 (three) times daily as needed for anxiety.   Yes [provider]  colestipol (COLESTID) 1 g tablet Take 4 g by mouth daily.   Yes [provider]  cromolyn (OPTICROM) 4 % ophthalmic solution Place 1 drop into both eyes daily. 11/16/20  Yes [provider]  Famotidine-Ca Carb-Mag Hydrox (PEPCID COMPLETE PO) Take 1 tablet by mouth daily.   Yes [provider]  fluvoxaMINE (LUVOX) 100 MG tablet Take 4 tablets (400 mg total) by mouth at bedtime. Patient taking differently: Take 200 mg by mouth 2 (two) times daily. 12/16/21  Yes Hurst, Rosey Bath T, PA-C  gabapentin (NEURONTIN) 600 MG tablet 2 p.o. every morning, 1 p.o. q. afternoon, 2 p.o. nightly. 01/29/22  Yes Melony Overly T, PA-C  hydrOXYzine (ATARAX) 25 MG tablet Take 1-2 tablets (25-50 mg total)  by mouth every 8 (eight) hours as needed. 12/16/21  Yes Melony Overly T, PA-C  Loratadine 10 MG CAPS Take 10 mg by mouth in the morning and at bedtime.   Yes [provider]  mirtazapine (REMERON) 15 MG tablet Take 1 tablet (15 mg total) by mouth at bedtime. 07/08/21  Yes Hurst, Rosey Bath T, PA-C  naproxen (NAPROSYN) 500 MG tablet Take 1,500 mg by mouth as needed for migraine.   Yes [provider]  pramipexole (MIRAPEX) 0.5 MG tablet Take 1 tablet (0.5 mg total) by mouth at bedtime as needed. 11/12/21  Yes Lomax, Amy, NP  Probiotic Product (DAILY PROBIOTIC PO) Take 1 capsule by mouth daily at 6 (six) AM.   Yes [provider]  RESTASIS 0.05 % ophthalmic emulsion Place 1 drop into both eyes 2 (two) times daily. 11/11/20  Yes [provider]  Rimegepant Sulfate (NURTEC) 75 MG TBDP Take 75 mg by mouth daily as needed (take for abortive therapy of migraine, no more than 1 tablet in 24 hours or 8 per month). 11/12/21  Yes Lomax, Amy, NP  sucralfate (CARAFATE) 1 GM/10ML suspension Take 10 mLs (1 g total) by mouth 4 (four) times daily as needed for up to 14 days. 03/18/22 04/01/22 Yes Mahon, Frederik Schmidt, NP  valACYclovir (VALTREX) 500 MG tablet Take 500 mg by mouth daily. 01/03/18  Yes [provider]  dicyclomine (BENTYL) 20 MG tablet Take 1 tablet (  20 mg total) by mouth 3 (three) times daily as needed for spasms (Diarrhea). Patient not taking: Reported on 03/25/2022 07/31/21   Anabel Halon, MD  ondansetron (ZOFRAN-ODT) 4 MG disintegrating tablet Take 1 tablet (4 mg total) by mouth every 8 (eight) hours as needed for nausea or vomiting. Patient not taking: Reported on 03/25/2022 12/22/21   Aida Raider, NP  tiZANidine (ZANAFLEX) 4 MG capsule Take 1 capsule (4 mg total) by mouth 3 (three) times daily as needed for muscle spasms. Do not drink alcohol or drive while taking this medication.  May cause drowsiness. Patient not taking: Reported on 03/25/2022 03/24/22   Particia Nearing, PA-C      Allergies    Codeine, Penicillins, Divalproex sodium, Meloxicam, Sonata [zaleplon], Topamax [topiramate], Aciphex [rabeprazole], Aimovig [erenumab-aooe], and Galcanezumab-gnlm    Review of Systems   Review of Systems  Gastrointestinal:  Positive for abdominal pain.  All other systems reviewed and are negative.   Physical Exam Updated Vital Signs BP 119/84   Pulse 63   Temp 98.1 F (36.7 C)   Resp 14   Ht 5\' 4"  (1.626 m)   Wt 81.6 kg   LMP 10/07/2011   SpO2 95%   BMI 30.90 kg/m  Physical Exam Vitals and nursing note reviewed.  Constitutional:      General: She is not in acute distress.    Appearance: She is well-developed. She is not ill-appearing.  HENT:     Head: Normocephalic and atraumatic.  Cardiovascular:     Rate and Rhythm: Normal rate and regular rhythm.  Pulmonary:     Effort: Pulmonary effort is normal.     Breath sounds: Normal breath sounds.  Abdominal:     General: Bowel sounds are normal.     Palpations: Abdomen is soft.     Tenderness: There is abdominal tenderness in the epigastric area.  Skin:    General: Skin is warm and dry.     Capillary Refill: Capillary refill takes less than 2 seconds.  Neurological:     Mental Status: She is alert.     ED Results / Procedures / Treatments   Labs (all labs ordered are listed, but only abnormal results are displayed) Labs Reviewed  COMPREHENSIVE METABOLIC PANEL - Abnormal; Notable for the following components:      Result Value   Glucose, Bld 108 (*)    All other components within normal limits  URINALYSIS, ROUTINE W REFLEX MICROSCOPIC - Abnormal; Notable for the following components:   Color, Urine STRAW (*)    All other components within normal limits  LIPASE, BLOOD  CBC    EKG None  Radiology CT ABDOMEN PELVIS WO CONTRAST  Result Date: 03/25/2022 CLINICAL DATA:  Abdominal pain, acute, nonlocalized. EXAM: CT ABDOMEN AND PELVIS WITHOUT CONTRAST TECHNIQUE:  Multidetector CT imaging of the abdomen and pelvis was performed following the standard protocol without IV contrast. RADIATION DOSE REDUCTION: This exam was performed according to the departmental dose-optimization program which includes automated exposure control, adjustment of the mA and/or kV according to patient size and/or use of iterative reconstruction technique. COMPARISON:  CT abdomen/pelvis 03/20/2019. FINDINGS: Lower chest: No acute abnormality. Hepatobiliary: No focal liver abnormality is seen. Status post cholecystectomy. No biliary dilatation. Pancreas: Unremarkable. No pancreatic ductal dilatation or surrounding inflammatory changes. Spleen: Normal in size without focal abnormality. Adrenals/Urinary Tract: Adrenal glands are unremarkable. Kidneys are normal, without renal calculi, focal lesion, or hydronephrosis. Bladder is unremarkable. Stomach/Bowel: Moderate hiatal hernia. No dilated  loops of small bowel. Normal appendix is visualized on coronal image 43 series 5. No bowel wall thickening or surrounding inflammation. Vascular/Lymphatic: No significant vascular findings are present. No enlarged abdominal or pelvic lymph nodes. Reproductive: Uterus and bilateral adnexa are unremarkable. Other: No abdominal wall hernia or abnormality. No abdominopelvic ascites. Musculoskeletal: No acute or significant osseous findings. IMPRESSION: 1. No acute abnormality of the abdomen or pelvis. 2. Moderate hiatal hernia. Electronically Signed   By: Orvan Falconer M.D.   On: 03/25/2022 11:58    Procedures Procedures   Medications Ordered in ED Medications  alum & mag hydroxide-simeth (MAALOX/MYLANTA) 200-200-20 MG/5ML suspension 30 mL (30 mLs Oral Given 03/25/22 1337)    And  lidocaine (XYLOCAINE) 2 % viscous mouth solution 15 mL (15 mLs Oral Given 03/25/22 1337)    ED Course/ Medical Decision Making/ A&P                           Medical Decision Making Amount and/or Complexity of Data Reviewed Labs:  ordered. Radiology: ordered.  Risk OTC drugs. Prescription drug management.   This patient presents to the ED for concern of abdominal pain. Differential diagnosis includes gastroenteritis, bowel obstruction, gastric ulcer, food intolerance   Lab Tests:  I Ordered, and personally interpreted labs.  The pertinent results include:  Normal CBC, CMP, lipase, and negative UA   Imaging Studies ordered:  I ordered imaging studies including CT abdomen  I independently visualized and interpreted imaging which showed no acute changes, mild hiatal hernia I agree with the radiologist interpretation   Medicines ordered and prescription drug management:  I ordered medication including Maalox, lidocaine  for abdominal pain  Reevaluation of the patient after these medicines showed that the patient improved I have reviewed the patients home medicines and have made adjustments as needed   Problem List / ED Course:  Patient presented to the ED for abdominal pain. Seen earlier in UC for similar complaints and advised to come in for evaluation due to severe pain. She was also seen in UC yesterday for back pain. She notes taking Aleve yesterday for back pain which increases my concern that this could be ulcer mediated pain. All labs were negative and reassuring with CT abdomen largely normal as well. Comparing prior CT abdomen, hiatal hernia appears unchanged to me. Although I do not feel that I can definitively identify cause of abdominal pain, I believe that this is likely secondary to NSAID use given triggering event last night she took 3 Aleves. Given no leukocytosis and normal CT abdomen, low concern for appendicitis, cholecystitis, or pancreatitis at this time. I believe patient would benefit from outpatient GI follow up for further evaluation if symptoms to do not improve or return. Patient agreeable with treatment plan and verbalized understanding all return precautions.  Final Clinical  Impression(s) / ED Diagnoses Final diagnoses:  Epigastric pain    Rx / DC Orders ED Discharge Orders     None         Smitty Knudsen, PA-C 03/25/22 1853    Gerhard Munch, MD 03/26/22 [id number]

## 2022-03-25 NOTE — Discharge Instructions (Signed)
You were seen in the emergency department for abdominal pain. Thankfully all of your labs were reassuring at this time and your hiatal hernia appears stable. You should plan to follow up with GI if your symptoms are not improving. Please return to the ED if your symptoms worsen.

## 2022-03-25 NOTE — ED Notes (Signed)
Retrurned from CT   Up to BR  awaiting results.  Mother at bedside

## 2022-03-25 NOTE — ED Notes (Signed)
Pt up amb in room.  Denies any pain

## 2022-03-25 NOTE — ED Provider Notes (Signed)
[facility]    CSN: [id number] Arrival date & time: 03/25/22  [id number]      History   Chief Complaint Chief Complaint  Patient presents with   Abdominal Pain    HPI Jennifer [name] is a 57 y.o. female.   Patient presents today for severe abdominal pain that began this morning when she woke up.  Reports she is unable to sit up because her pain is a "11-12/10".  Reports a history of bulimia and has been having a lot of nausea and dry heaving.  No vomiting blood or blood in her stool.  She is crying in the exam room today and laying flat when I enter the room and is unable to sit up without being in excruciating pain.  Reports a history of hiatal hernia.    Past Medical History:  Diagnosis Date   Abdominal pain, epigastric 10/07/2018   Acute non-recurrent maxillary sinusitis 11/07/2019   Anxiety    Arthritis    Binge-eating and purging type anorexia nervosa    Bipolar affective disorder ([place]) 12/04/2015   Bipolar affective disorder, current episode manic without psychotic symptoms ([place]) 12/04/2015   Bipolar disorder ([place])    Cellulitis and abscess of buttock 02/15/2013   Cystitis, interstitial    Depression    Depression    Phreesia 01/28/2020   Emphysema lung (HCC)    Esophageal polyp    about 20 years ago   GERD (gastroesophageal reflux disease)    Left wrist pain 03/22/2019   OCD (obsessive compulsive disorder)    Palpitations 05/25/2014   Rectal pain 12/06/2018   Rib pain on right side 01/31/2019   Rosacea 03/27/2019   Sprain of temporomandibular joint or ligament 01/10/2019    Patient Active Problem List   Diagnosis Date Noted   Prediabetes 02/13/2022   Centrilobular emphysema ([place]) 12/17/2021   Tobacco abuse 12/17/2021   Hospital discharge follow-up 09/17/2021   SVT (supraventricular tachycardia) 09/11/2021   Elevated troponin 09/10/2021   Encounter for general adult medical examination with abnormal findings 08/08/2021   Chronic diarrhea  08/08/2021   Fissure in skin of foot 08/08/2021   Allergic rhinitis 02/28/2021   Hyperkeratotic fissured eczema of hand 01/21/2021   Snorings 12/30/2020   PVD (peripheral vascular disease) ([place]) 12/30/2020   Sleep related bruxism 12/30/2020   Thrombocytopenia ([place]) 11/29/2020   Leg swelling 11/29/2020   Left hip pain 11/19/2020   Easy bruising 08/19/2020   Peripheral neuropathy 08/19/2020   Restless leg syndrome 06/13/2020   Tick bite of back 06/13/2020   Neck pain on right side 03/14/2020   Oral candidiasis 01/10/2019   Bulimia 09/12/2018   Gastroesophageal reflux disease 09/12/2018   Migraine without status migrainosus, not intractable 09/12/2018   Mixed bipolar I disorder ([place]) 12/05/2015   OCD (obsessive compulsive disorder) 12/04/2015    Past Surgical History:  Procedure Laterality Date   BLADDER SURGERY     CHOLECYSTECTOMY     COLONOSCOPY WITH PROPOFOL N/A 10/05/2019   Procedure: COLONOSCOPY WITH PROPOFOL;  Surgeon: Daneil Dolin, MD;  [facility];  Service: Endoscopy;  Laterality: N/A;  12:45pm   COSMETIC SURGERY N/A    Phreesia 01/28/2020   ESOPHAGOGASTRODUODENOSCOPY  09/28/2016   [facility]; Dr. Therisa Doyne; erosions in the esophagus, 5 cm hiatal hernia, nonbleeding erosive gastropathy s/p biopsied, normal duodenum.  Path with chronic inactive gastritis, no H. pylori or intestinal metaplasia.    ESOPHAGOGASTRODUODENOSCOPY (EGD) WITH PROPOFOL N/A 10/17/2018   Dr. Gala Romney: mild reflux esophagitis,  small hiatal hernia   POLYPECTOMY  10/05/2019   Procedure: POLYPECTOMY;  Surgeon: Daneil Dolin, MD;  Location: [facility];  Service: Endoscopy;;   SVT ABLATION N/A 10/31/2021   Procedure: SVT ABLATION;  Surgeon: Melida Quitter, MD;  Location: [facility];  Service: Cardiovascular;  Laterality: N/A;   VOCAL CORD LATERALIZATION, ENDOSCOPIC APPROACH W/ MLB      OB History     Gravida  3   Para      Term      Preterm      AB  3   Living  0       SAB  1   IAB  2   Ectopic      Multiple      Live Births               Home Medications    Prior to Admission medications   Medication Sig Start Date End Date Taking? Authorizing Provider  albuterol (VENTOLIN HFA) 108 (90 Base) MCG/ACT inhaler Inhale 2 puffs into the lungs every 6 (six) hours as needed for wheezing or shortness of breath. 12/17/21   Lindell Spar, MD  Brexpiprazole ([name]) 4 MG TABS Take 4 mg by mouth in the morning. 12/16/21   [name] T, PA-C  clonazePAM (KLONOPIN) 0.5 MG tablet Take 0.5 mg by mouth 3 (three) times daily as needed for anxiety.    [provider]  colestipol (COLESTID) 1 g tablet Take 4 g by mouth 2 (two) times daily.    [provider]  cromolyn (OPTICROM) 4 % ophthalmic solution Place 1 drop into both eyes daily. 11/16/20   [provider]  dicyclomine (BENTYL) 20 MG tablet Take 1 tablet (20 mg total) by mouth 3 (three) times daily as needed for spasms (Diarrhea). 07/31/21   Lindell Spar, MD  Famotidine-Ca Carb-Mag Hydrox (PEPCID COMPLETE PO) Take 1 tablet by mouth daily.    [provider]  fluvoxaMINE (LUVOX) 100 MG tablet Take 4 tablets (400 mg total) by mouth at bedtime. 12/16/21   [name] T, PA-C  gabapentin (NEURONTIN) 600 MG tablet 2 p.o. every morning, 1 p.o. q. afternoon, 2 p.o. nightly. 01/29/22   Addison Lank, PA-C  hydrOXYzine (ATARAX) 25 MG tablet Take 1-2 tablets (25-50 mg total) by mouth every 8 (eight) hours as needed. 12/16/21   [name] T, PA-C  Loratadine 10 MG CAPS Take 10 mg by mouth in the morning and at bedtime.    [provider]  mirtazapine (REMERON) 15 MG tablet Take 1 tablet (15 mg total) by mouth at bedtime. 07/08/21   [name] T, PA-C  naproxen (NAPROSYN) 500 MG tablet Take 1,500 mg by mouth as needed for migraine.    [provider]  ondansetron (ZOFRAN-ODT) 4 MG disintegrating tablet Take 1 tablet (4 mg total) by mouth every 8 (eight)  hours as needed for nausea or vomiting. 12/22/21   Sherron Monday, NP  pramipexole (MIRAPEX) 0.5 MG tablet Take 1 tablet (0.5 mg total) by mouth at bedtime as needed. 11/12/21   Lomax, Amy, NP  Probiotic Product (DAILY PROBIOTIC PO) Take 1 capsule by mouth daily at 6 (six) AM.    [provider]  RESTASIS 0.05 % ophthalmic emulsion Place 1 drop into both eyes 2 (two) times daily. 11/11/20   [provider]  Rimegepant Sulfate (NURTEC) 75 MG TBDP Take 75 mg by mouth daily as needed (take for abortive therapy  of migraine, no more than 1 tablet in 24 hours or 8 per month). 11/12/21   Lomax, Amy, NP  sucralfate (CARAFATE) 1 GM/10ML suspension Take 10 mLs (1 g total) by mouth 4 (four) times daily as needed for up to 14 days. 03/18/22 04/01/22  Sherron Monday, NP  tiZANidine (ZANAFLEX) 4 MG capsule Take 1 capsule (4 mg total) by mouth 3 (three) times daily as needed for muscle spasms. Do not drink alcohol or drive while taking this medication.  May cause drowsiness. 03/24/22   Volney American, PA-C  valACYclovir (VALTREX) 500 MG tablet Take 500 mg by mouth daily. 01/03/18   [provider]    Family History Family History  Problem Relation Age of Onset   Healthy Mother    Healthy Father    Migraines Paternal Grandmother    Colon cancer Neg Hx    Colon polyps Neg Hx     Social History Social History   Tobacco Use   Smoking status: Former    Packs/day: 1.00    Years: 25.00    Total pack years: 25.00    Types: Cigarettes    Start date: 01/20/1988    Quit date: 11/12/2021    Years since quitting: 0.3   Smokeless tobacco: Never  Vaping Use   Vaping Use: Never used  Substance Use Topics   Alcohol use: No    Alcohol/week: 0.0 standard drinks of alcohol   Drug use: No     Allergies   Codeine, Penicillins, Divalproex sodium, Meloxicam, Sonata [zaleplon], Topamax [topiramate], Aciphex [rabeprazole], Aimovig [erenumab-aooe], and Galcanezumab-gnlm   Review  of Systems Review of Systems Per HPI  Physical Exam Triage Vital Signs ED Triage Vitals  Enc Vitals Group     BP 03/25/22 1014 (!) 152/101     Pulse Rate 03/25/22 1014 81     Resp 03/25/22 1014 18     Temp 03/25/22 1014 97.7 F (36.5 C)     Temp Source 03/25/22 1014 Oral     SpO2 03/25/22 1014 96 %     Weight --      Height --      Head Circumference --      Peak Flow --      Pain Score 03/25/22 1016 10     Pain Loc --      Pain Edu? --      Excl. in [place]? --    No data found.  Updated Vital Signs BP (!) 152/101 (BP Location: Right Arm)   Pulse 81   Temp 97.7 F (36.5 C) (Oral)   Resp 18   LMP 10/07/2011   SpO2 96%   Visual Acuity Right Eye Distance:   Left Eye Distance:   Bilateral Distance:    Right Eye Near:   Left Eye Near:    Bilateral Near:     Physical Exam Vitals and nursing note reviewed.  Constitutional:      General: She is not in acute distress.    Appearance: Normal appearance. She is not toxic-appearing.  HENT:     Head: Normocephalic and atraumatic.     Mouth/Throat:     Mouth: Mucous membranes are moist.     Pharynx: Oropharynx is clear.  Eyes:     General: No scleral icterus.    Extraocular Movements: Extraocular movements intact.  Cardiovascular:     Rate and Rhythm: Regular rhythm. Tachycardia present.  Pulmonary:     Effort: Pulmonary effort is normal. No respiratory distress.  Breath sounds: Normal breath sounds. No wheezing, rhonchi or rales.  Abdominal:     General: Abdomen is flat. Bowel sounds are normal. There is no distension.     Palpations: Abdomen is soft.     Tenderness: There is abdominal tenderness in the epigastric area. There is no guarding.  Musculoskeletal:     Cervical back: Normal range of motion.  Lymphadenopathy:     Cervical: No cervical adenopathy.  Skin:    General: Skin is warm and dry.     Capillary Refill: Capillary refill takes less than 2 seconds.     Coloration: Skin is not jaundiced or pale.      Findings: No erythema.  Neurological:     Mental Status: She is alert and oriented to person, place, and time.  Psychiatric:        Mood and Affect: Mood is not anxious. Affect is tearful.        Behavior: Behavior is cooperative.      UC Treatments / Results  Labs (all labs ordered are listed, but only abnormal results are displayed) Labs Reviewed - No data to display  EKG   Radiology No results found.  Procedures Procedures (including critical care time)  Medications Ordered in UC Medications - No data to display  Initial Impression / Assessment and Plan / UC Course  I have reviewed the triage vital signs and the nursing notes.  Pertinent labs & imaging results that were available during my care of the patient were reviewed by me and considered in my medical decision making (see chart for details).   Patient is well-appearing, afebrile, not tachycardic, not tachypneic, oxygenating well on room air.  Patient is hypertensive in triage today.  1. Epigastric pain Unclear etiology Differentials include perforated ulcer, gastritis, other intra-abdominal process I recommended further evaluation and management in the emergency room given significant abdominal pain, lack of resources in urgent care setting for diagnosing severe abdominal pain Patient is in agreement to this plan Patient is safe to transport via private vehicle at this time  The patient was given the opportunity to ask questions.  All questions answered to their satisfaction.  The patient is in agreement to this plan.    Final Clinical Impressions(s) / UC Diagnoses   Final diagnoses:  Epigastric pain     Discharge Instructions      Please go to ED for further evaluation of your abdominal pain    ED Prescriptions   None    PDMP not reviewed this encounter.   Eulogio Bear, NP 03/25/22 1030

## 2022-03-26 ENCOUNTER — Other Ambulatory Visit: Payer: Self-pay | Admitting: Gastroenterology

## 2022-03-26 DIAGNOSIS — R1013 Epigastric pain: Secondary | ICD-10-CM

## 2022-03-26 DIAGNOSIS — F429 Obsessive-compulsive disorder, unspecified: Secondary | ICD-10-CM | POA: Diagnosis not present

## 2022-03-26 DIAGNOSIS — F502 Bulimia nervosa: Secondary | ICD-10-CM | POA: Diagnosis not present

## 2022-03-26 DIAGNOSIS — F4310 Post-traumatic stress disorder, unspecified: Secondary | ICD-10-CM | POA: Diagnosis not present

## 2022-03-26 DIAGNOSIS — F319 Bipolar disorder, unspecified: Secondary | ICD-10-CM | POA: Diagnosis not present

## 2022-03-26 DIAGNOSIS — K2100 Gastro-esophageal reflux disease with esophagitis, without bleeding: Secondary | ICD-10-CM

## 2022-03-26 MED ORDER — ESOMEPRAZOLE MAGNESIUM 20 MG PO CPDR: 20.0000 mg | DELAYED_RELEASE_CAPSULE | Freq: Every day | ORAL | 1 refills | Status: DC

## 2022-03-30 DIAGNOSIS — F4310 Post-traumatic stress disorder, unspecified: Secondary | ICD-10-CM | POA: Diagnosis not present

## 2022-03-30 DIAGNOSIS — F502 Bulimia nervosa: Secondary | ICD-10-CM | POA: Diagnosis not present

## 2022-03-30 DIAGNOSIS — F319 Bipolar disorder, unspecified: Secondary | ICD-10-CM | POA: Diagnosis not present

## 2022-03-30 DIAGNOSIS — F429 Obsessive-compulsive disorder, unspecified: Secondary | ICD-10-CM | POA: Diagnosis not present

## 2022-04-01 DIAGNOSIS — F319 Bipolar disorder, unspecified: Secondary | ICD-10-CM | POA: Diagnosis not present

## 2022-04-01 DIAGNOSIS — F502 Bulimia nervosa: Secondary | ICD-10-CM | POA: Diagnosis not present

## 2022-04-01 DIAGNOSIS — F429 Obsessive-compulsive disorder, unspecified: Secondary | ICD-10-CM | POA: Diagnosis not present

## 2022-04-01 DIAGNOSIS — F4310 Post-traumatic stress disorder, unspecified: Secondary | ICD-10-CM | POA: Diagnosis not present

## 2022-04-07 ENCOUNTER — Encounter: Payer: Medicare PPO | Admitting: Gastroenterology

## 2022-04-08 DIAGNOSIS — F429 Obsessive-compulsive disorder, unspecified: Secondary | ICD-10-CM | POA: Diagnosis not present

## 2022-04-08 DIAGNOSIS — F319 Bipolar disorder, unspecified: Secondary | ICD-10-CM | POA: Diagnosis not present

## 2022-04-08 DIAGNOSIS — F502 Bulimia nervosa: Secondary | ICD-10-CM | POA: Diagnosis not present

## 2022-04-08 DIAGNOSIS — F4310 Post-traumatic stress disorder, unspecified: Secondary | ICD-10-CM | POA: Diagnosis not present

## 2022-04-13 ENCOUNTER — Ambulatory Visit: Payer: Medicare PPO

## 2022-04-15 DIAGNOSIS — F4310 Post-traumatic stress disorder, unspecified: Secondary | ICD-10-CM | POA: Diagnosis not present

## 2022-04-15 DIAGNOSIS — F429 Obsessive-compulsive disorder, unspecified: Secondary | ICD-10-CM | POA: Diagnosis not present

## 2022-04-15 DIAGNOSIS — F319 Bipolar disorder, unspecified: Secondary | ICD-10-CM | POA: Diagnosis not present

## 2022-04-15 DIAGNOSIS — F502 Bulimia nervosa: Secondary | ICD-10-CM | POA: Diagnosis not present

## 2022-04-20 NOTE — Progress Notes (Unsigned)
GI Office Note    Referring Provider: Lindell Spar, MD Primary Care Physician:  Lindell Spar, MD Primary Gastroenterologist: [name].Rourk, MD  Date:  04/21/2022  ID:  Jennifer [name], DOB [birth year], MRN [id number]   Chief Complaint   Chief Complaint  Patient presents with   Abdominal Pain    Patient here today for a follow up on epigastric pain, after eating a hamburger, this past weekend. Patient says she drank mylanta, which did not help much.   History of Present Illness  Jennifer [name] is a 57 y.o. female with a history of anxiety, GERD, bulimia, fatty liver, depression, bipolar 1 arthritis, and diarrhea presenting today with complaint of  epigastric abdominal pain.   Last colonoscopy September 2021: -2 sigmoid polyps -Advised repeat in 10 years   Clinic visit in April 2023 with complaint of diarrhea.  Having 3-4 loose stools per day, small to moderate amounts.  Also with incomplete defecation and occasional urgency.  Denies nocturnal stools.  Abdominal pain relieved with defecation.  Pain primarily periumbilical region and lower abdomen.  Diarrhea made worse with eating.  Had good relief with over-the-counter Imodium.  Negative C. difficile, Giardia.  GI pathogen panel was positive for norovirus.   PCP visit in July again noting chronic diarrhea.  Had some improvement with Bentyl fiber, and probiotic.  Also reported chronic nausea due to bulimia.  Started on cholestyramine 4 g twice daily.   Visit on 10/01/2021, virtual follow-up.  Imodium remained helpful with diarrhea however reported more abdominal pain.  Cholestyramine also somewhat helpful however difficult with twice daily dosing as she reported all contents do not seem to resolve.  Still having urgency with varying bowel habits.  Sometimes 8-9 stools per day and varying amounts.  Denied nausea, melena, BRBPR, fever, or chills.  Reports scattered bulimic episodes.  Advised Colestid 4 g 2-3 times daily.  Advised to  continue Levsin for abdominal pain.  Use Imodium prior to meals if persistent symptoms.  Advised to keep a food and symptom log.  Potential to try Xifaxan in the future if symptoms continue.   She underwent cardiac ablation in October given symptomatic SVT.  Ablation was successful and her metoprolol was discontinued.  She was advised to follow-up in 1 year.    Office visit 12/22/21. Having a bad migraine that day.  Zofran somewhat helping.  Continue to have issues with purging and continue to see psychiatry and therapy.  Mostly having dry heaves occurring in the mornings most of the week lasting 10 to 15 minutes at a time.  Improves if she lays back down.  Continues to have occasional abdominal pain with diarrhea however felt as though Colestid was working well for her.  Taking it mostly once daily, at times she takes it twice a day it may not be as easy for her to go.  Continues to admit to improvement of bloating if she does not purge.  Advised to continue dicyclomine 20 mg as needed.  Zofran refilled.  Advised to continue Colestid 4 g once to twice daily for diarrhea, also refilled.  Continue to advise famotidine daily and increase to twice daily if needed, continue follow-up with therapy for bulimia/purging, and to avoid lactose products.  Last office visit 03/10/22.  Probiotic which helped improve her diarrhea therefore she stopped her colestipol and then had return of diarrhea.  Diarrhea was causing redness inflammation on her perineum.  Nausea improved, only having dry heaves occasionally.  Has limiting  Zofran is severe and she needs it.  Did report some recent purging associated with abdominal spasms.  Continuing to undergo weekly therapy and brain spotting for bulimia and bipolar disorder.  At improvement abdominal pain since starting probiotic.  Not using dicyclomine much at all.  Advised Desitin as needed for perineal irritation.  Advised to resume colestipol 4 g once to twice daily.  Continue  dicyclomine 20 mg as needed.  Continue famotidine once daily and Zofran as needed.  Avoid lactose.  Patient sent in a message at the end of February reporting stomach upset/pain.  Continues to have purging.  Tried dicyclomine 1 night and Levsin management.  Consider short course of Carafate for 2 weeks.  Advised to increase famotidine to twice daily.  Received notification the patient recently went to the ED at the beginning of March for abdominal pain and vomiting.  Reinforced the need to stop all NSAIDs.  Advised that she may need possible repeat EGD.  Nexium was sent in given intolerance to pantoprazole in the past due to severe diarrhea.  CT A/P 03/25/2022: No acute abnormality in the abdomen or pelvis, moderate hiatal hernia.  Patient sent MyChart message 04/19/2022 reporting severe pain to her abdomen.  She feels like it may be from her hiatal hernia.  Recommended scheduling an EGD and offered carafate.  Patient made an appointment in the office.  Today:  Reports when she is sitting up and is better when she lays down. Had some relief with mylanta. Has been eating shrimp and grilled chicken mostly. Did eat a hamburger patty and reports she had an upset stomach after eating it but then developed mid epigastric pain and had some mild nausea with this as well. Reports when she first went on pantoprazole in the past she had good relief of reflux symptoms but stopped due to pantoprazole. Has been maintained well recently on famotidine twice per day. Started on Nexium recently after ED episode given reflux type symptoms. No dysphagia.   Diarrhea is controlled on colestipol usually once day. Using the dicyclomine only when pain is severe.   Only 2 purging episodes in the last 2 weeks. Doing weight watchers currently. Has lost a few lbs.   Current Outpatient Medications  Medication Sig Dispense Refill   albuterol (VENTOLIN HFA) 108 (90 Base) MCG/ACT inhaler Inhale 2 puffs into the lungs every 6 (six)  hours as needed for wheezing or shortness of breath. 18 g 5   Brexpiprazole ([name]) 4 MG TABS Take 4 mg by mouth in the morning. 30 tablet 11   clonazePAM (KLONOPIN) 0.5 MG tablet Take 0.5 mg by mouth 3 (three) times daily as needed for anxiety.     colestipol (COLESTID) 1 g tablet Take 4 g by mouth daily.     cromolyn (OPTICROM) 4 % ophthalmic solution Place 1 drop into both eyes daily.     dicyclomine (BENTYL) 20 MG tablet Take 1 tablet (20 mg total) by mouth 3 (three) times daily as needed for spasms (Diarrhea). 30 tablet 1   esomeprazole (NEXIUM) 20 MG capsule Take 1 capsule (20 mg total) by mouth daily. 30 capsule 1   fluvoxaMINE (LUVOX) 100 MG tablet Take 4 tablets (400 mg total) by mouth at bedtime. (Patient taking differently: Take 200 mg by mouth 2 (two) times daily.) 360 tablet 1   gabapentin (NEURONTIN) 600 MG tablet 2 p.o. every morning, 1 p.o. q. afternoon, 2 p.o. nightly. 150 tablet 11   hydrOXYzine (ATARAX) 25 MG tablet Take 1-2  tablets (25-50 mg total) by mouth every 8 (eight) hours as needed. 90 tablet 11   Loratadine 10 MG CAPS Take 10 mg by mouth in the morning and at bedtime.     mirtazapine (REMERON) 15 MG tablet Take 1 tablet (15 mg total) by mouth at bedtime. 90 tablet 3   naproxen (NAPROSYN) 500 MG tablet Take 1,500 mg by mouth as needed for migraine.     ondansetron (ZOFRAN-ODT) 4 MG disintegrating tablet Take 1 tablet (4 mg total) by mouth every 8 (eight) hours as needed for nausea or vomiting. 30 tablet 0   pramipexole (MIRAPEX) 0.5 MG tablet Take 1 tablet (0.5 mg total) by mouth at bedtime as needed. 90 tablet 3   Probiotic Product (DAILY PROBIOTIC PO) Take 1 capsule by mouth daily at 6 (six) AM.     RESTASIS 0.05 % ophthalmic emulsion Place 1 drop into both eyes 2 (two) times daily.     Rimegepant Sulfate (NURTEC) 75 MG TBDP Take 75 mg by mouth daily as needed (take for abortive therapy of migraine, no more than 1 tablet in 24 hours or 8 per month). 8 tablet 11    tiZANidine (ZANAFLEX) 4 MG capsule Take 1 capsule (4 mg total) by mouth 3 (three) times daily as needed for muscle spasms. Do not drink alcohol or drive while taking this medication.  May cause drowsiness. 15 capsule 0   valACYclovir (VALTREX) 500 MG tablet Take 500 mg by mouth daily.     Famotidine-Ca Carb-Mag Hydrox (PEPCID COMPLETE PO) Take 1 tablet by mouth daily. (Patient not taking: Reported on 04/21/2022)     No current facility-administered medications for this visit.    Past Medical History:  Diagnosis Date   Abdominal pain, epigastric 10/07/2018   Acute non-recurrent maxillary sinusitis 11/07/2019   Anxiety    Arthritis    Binge-eating and purging type anorexia nervosa    Bipolar affective disorder 12/04/2015   Bipolar affective disorder, current episode manic without psychotic symptoms 12/04/2015   Bipolar disorder    Cellulitis and abscess of buttock 02/15/2013   Cystitis, interstitial    Depression    Depression    Phreesia 01/28/2020   Emphysema lung    Esophageal polyp    about 20 years ago   GERD (gastroesophageal reflux disease)    Left wrist pain 03/22/2019   OCD (obsessive compulsive disorder)    Palpitations 05/25/2014   Rectal pain 12/06/2018   Rib pain on right side 01/31/2019   Rosacea 03/27/2019   Sprain of temporomandibular joint or ligament 01/10/2019    Past Surgical History:  Procedure Laterality Date   BLADDER SURGERY     CHOLECYSTECTOMY     COLONOSCOPY WITH PROPOFOL N/A 10/05/2019   Procedure: COLONOSCOPY WITH PROPOFOL;  Surgeon: Daneil Dolin, MD;  Location: [facility];  Service: Endoscopy;  Laterality: N/A;  12:45pm   COSMETIC SURGERY N/A    Phreesia 01/28/2020   ESOPHAGOGASTRODUODENOSCOPY  09/28/2016   [facility]; Dr. Therisa Doyne; erosions in the esophagus, 5 cm hiatal hernia, nonbleeding erosive gastropathy s/p biopsied, normal duodenum.  Path with chronic inactive gastritis, no H. pylori or intestinal metaplasia.    ESOPHAGOGASTRODUODENOSCOPY  (EGD) WITH PROPOFOL N/A 10/17/2018   Dr. Gala Romney: mild reflux esophagitis, small hiatal hernia   POLYPECTOMY  10/05/2019   Procedure: POLYPECTOMY;  Surgeon: Daneil Dolin, MD;  Location: [facility];  Service: Endoscopy;;   SVT ABLATION N/A 10/31/2021   Procedure: SVT ABLATION;  Surgeon: Melida Quitter, MD;  Location: [place] [facility];  Service: Cardiovascular;  Laterality: N/A;   VOCAL CORD LATERALIZATION, ENDOSCOPIC APPROACH W/ MLB      Family History  Problem Relation Age of Onset   Healthy Mother    Healthy Father    Migraines Paternal Grandmother    Colon cancer Neg Hx    Colon polyps Neg Hx     Allergies as of 04/21/2022 - Review Complete 04/21/2022  Allergen Reaction Noted   Codeine Shortness Of Breath, Nausea Only, and Rash 02/15/2013   Penicillins Hives, Rash, Shortness Of Breath, and Swelling 10/09/2011   Divalproex sodium Hives, Itching, and Rash 01/28/2020   Meloxicam Other (See Comments) and Nausea And Vomiting 05/08/2014   Sonata [zaleplon] Other (See Comments) 10/22/2017   Topamax [topiramate] Other (See Comments) 10/04/2018   Aciphex [rabeprazole] Swelling 07/16/2020   Aimovig [erenumab-aooe] Itching 06/26/2019   Galcanezumab-gnlm Rash and Hives 10/04/2018    Social History   Socioeconomic History   Marital status: Married    Spouse name: Todd   Number of children: 0   Years of education: Not on file   Highest education level: Not on file  Occupational History   Not on file  Tobacco Use   Smoking status: Every Day    Packs/day: 1.00    Years: 25.00    Additional pack years: 0.00    Total pack years: 25.00    Types: Cigarettes    Start date: 01/20/1988    Last attempt to quit: 11/12/2021    Years since quitting: 0.4   Smokeless tobacco: Never  Vaping Use   Vaping Use: Never used  Substance and Sexual Activity   Alcohol use: No    Alcohol/week: 0.0 standard drinks of alcohol   Drug use: No   Sexual activity: Yes    Birth control/protection:  None  Other Topics Concern   Not on file  Social History Narrative   Lives with husband -[name] of 32 years       [place]- [place]      Enjoys: reading-all genres      Diet: eats all food groups   Caffeine: 1 cup daily at times, diet dr pepper daily   Water: gatorade  zero and water       Wears seat belt    Does not use phone while driving   Smoke and Development worker, 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         Social Determinants of Health   Financial Resource Strain: Low Risk  (10/16/2021)   Overall Financial Resource Strain (CARDIA)    Difficulty of Paying Living Expenses: Not hard at all  Food Insecurity: No Food Insecurity (10/16/2021)   Hunger Vital Sign    Worried About Running Out of Food in the Last Year: Never true    Ran Out of Food in the Last Year: Never true  Transportation Needs: No Transportation Needs (10/16/2021)   PRAPARE - Hydrologist (Medical): No    Lack of Transportation (Non-Medical): No  Physical Activity: Inactive (10/16/2021)   Exercise Vital Sign    Days of Exercise per Week: 0 days    Minutes of Exercise per Session: 0 min  Stress: Stress Concern Present (10/16/2021)   [place]    Feeling of Stress : Very much  Social Connections: Socially Integrated (10/16/2021)   Social Connection and Isolation Panel [NHANES]    Frequency  of Communication with Friends and Family: More than three times a week    Frequency of Social Gatherings with Friends and Family: Once a week    Attends Religious Services: More than 4 times per year    Active Member of [employer] or Organizations: Yes    Attends Music therapist: More than 4 times per year    Marital Status: Married     Review of Systems   Gen: Denies fever, chills, anorexia. Denies fatigue, weakness, weight loss.  CV: Denies chest pain, palpitations, syncope, peripheral edema, and  claudication. Resp: Denies dyspnea at rest, cough, wheezing, coughing up blood, and pleurisy. GI: See HPI Derm: Denies rash, itching, dry skin Psych: Denies depression, anxiety, memory loss, confusion. No homicidal or suicidal ideation.  Heme: Denies bruising, bleeding, and enlarged lymph nodes.   Physical Exam   BP 114/75 (BP Location: Left Arm, Patient Position: Sitting, Cuff Size: Large)   Pulse 84   Temp (!) 97.1 F (36.2 C) (Temporal)   Ht 5\' 4"  (1.626 m)   Wt 173 lb 11.2 oz (78.8 kg)   LMP 10/07/2011   BMI 29.82 kg/m   General:   Alert and oriented. No distress noted. Pleasant and cooperative.  Head:  Normocephalic and atraumatic. Eyes:  Conjuctiva clear without scleral icterus. Mouth:  Oral mucosa pink and moist. Good dentition. No lesions. Lungs:  Clear to auscultation bilaterally. No wheezes, rales, or rhonchi. No distress.  Heart:  S1, S2 present without murmurs appreciated.  Abdomen:  +BS, soft,  non-distended. TTP to epigastrium. Mild ttp to lower abdomen. No rebound or guarding. No HSM or masses noted. Rectal: deferred Msk:  Symmetrical without gross deformities. Normal posture. Extremities:  Without edema. Neurologic:  Alert and  oriented x4 Psych:  Alert and cooperative. Normal mood and affect.   Assessment  ALLAINA VIGORITO is a 57 y.o. female with a history of anxiety, GERD, bulimia, fatty liver, depression, bipolar 1, bulimia, arthritis, and chronic diarrhea presenting today with complaint of  epigastric abdominal pain.   Chronic diarrhea: Controlled with colestipol 4 g once to twice daily.  Still has some occasional lower abdominal cramping and if needed she will use dicyclomine.  She reduces her dose if she has any constipation.  GERD, Nausea, dry heaves, upper abdominal pain: Chronic history of bulimia, continues to follow with psychology for therapy and brain spotting.  Reports he has only had 2 purge episodes within the last couple of weeks.  No current  abdominal spasms but does report some intermittent pain that occurs.  She went to the ED earlier in March for mid epigastric pain and had CT scan that revealed moderate hiatal hernia.  She was advised to stop NSAIDs.  She has been using Mylanta with some relief.  Patient reported recurrent abdominal pain over the weekend after eating a hamburger therefore recommended for her to start Nexium in addition to her famotidine given her symptoms are fairly consistent with uncontrolled reflux.  Her last upper endoscopy was in 2020 with a small hiatal hernia noted and mild erosive reflux esophagitis.  Suspect her symptoms are combination of her hiatal hernia that appears to increase in size as well as dietary indiscretions.  Given it has been over 3 years since her last upper endoscopy she continues to have recurrent symptoms we will further evaluate with another upper endoscopy to assess for gastritis, esophagitis, duodenitis, peptic ulcer disease.  For now I recommended for her to continue Nexium once daily and if  she feels like she needs that she can continue with Pepcid once to twice daily.  Advised strict GERD diet and avoiding fatty/fried foods.   PLAN   Proceed with upper endoscopy with propofol by Dr. Gala Romney in near future: the risks, benefits, and alternatives have been discussed with the patient in detail. The patient states understanding and desires to proceed. ASA 3 Avoid NSAIDs Strict GERD diet/lifestyle modifications Continue Nexium 20 mg once daily.  Continue Famotidine 20 mg one to two times daily Continue Colestipol 4g once daily.  Continue dicyclomine 20 mg up to TID as needed for diarrhea and abdominal spasms.  Follow up 6-8 weeks post procedure.    [name], MSN, FNP-BC, AGACNP-BC [facility]

## 2022-04-20 NOTE — H&P (View-Only) (Signed)
 GI Office Note    Referring Provider: Patel, Rutwik K, MD Primary Care Physician:  Patel, Rutwik K, MD Primary Gastroenterologist: Robert M.Rourk, MD  Date:  04/21/2022  ID:  Jennifer [name], DOB 04/28/1965, MRN [id number]   Chief Complaint   Chief Complaint  Patient presents with   Abdominal Pain    Patient here today for a follow up on epigastric pain, after eating a hamburger, this past weekend. Patient says she drank mylanta, which did not help much.   History of Present Illness  Jennifer [name] is a 56 y.o. female with a history of anxiety, GERD, bulimia, fatty liver, depression, bipolar 1 arthritis, and diarrhea presenting today with complaint of  epigastric abdominal pain.   Last colonoscopy September 2021: -2 sigmoid polyps -Advised repeat in 10 years   Clinic visit in April 2023 with complaint of diarrhea.  Having 3-4 loose stools per day, small to moderate amounts.  Also with incomplete defecation and occasional urgency.  Denies nocturnal stools.  Abdominal pain relieved with defecation.  Pain primarily periumbilical region and lower abdomen.  Diarrhea made worse with eating.  Had good relief with over-the-counter Imodium.  Negative C. difficile, Giardia.  GI pathogen panel was positive for norovirus.   PCP visit in July again noting chronic diarrhea.  Had some improvement with Bentyl fiber, and probiotic.  Also reported chronic nausea due to bulimia.  Started on cholestyramine 4 g twice daily.   Visit on 10/01/2021, virtual follow-up.  Imodium remained helpful with diarrhea however reported more abdominal pain.  Cholestyramine also somewhat helpful however difficult with twice daily dosing as she reported all contents do not seem to resolve.  Still having urgency with varying bowel habits.  Sometimes 8-9 stools per day and varying amounts.  Denied nausea, melena, BRBPR, fever, or chills.  Reports scattered bulimic episodes.  Advised Colestid 4 g 2-3 times daily.  Advised to  continue Levsin for abdominal pain.  Use Imodium prior to meals if persistent symptoms.  Advised to keep a food and symptom log.  Potential to try Xifaxan in the future if symptoms continue.   She underwent cardiac ablation in October given symptomatic SVT.  Ablation was successful and her metoprolol was discontinued.  She was advised to follow-up in 1 year.    Office visit 12/22/21. Having a bad migraine that day.  Zofran somewhat helping.  Continue to have issues with purging and continue to see psychiatry and therapy.  Mostly having dry heaves occurring in the mornings most of the week lasting 10 to 15 minutes at a time.  Improves if she lays back down.  Continues to have occasional abdominal pain with diarrhea however felt as though Colestid was working well for her.  Taking it mostly once daily, at times she takes it twice a day it may not be as easy for her to go.  Continues to admit to improvement of bloating if she does not purge.  Advised to continue dicyclomine 20 mg as needed.  Zofran refilled.  Advised to continue Colestid 4 g once to twice daily for diarrhea, also refilled.  Continue to advise famotidine daily and increase to twice daily if needed, continue follow-up with therapy for bulimia/purging, and to avoid lactose products.  Last office visit 03/10/22.  Probiotic which helped improve her diarrhea therefore she stopped her colestipol and then had return of diarrhea.  Diarrhea was causing redness inflammation on her perineum.  Nausea improved, only having dry heaves occasionally.  Has limiting   Zofran is severe and she needs it.  Did report some recent purging associated with abdominal spasms.  Continuing to undergo weekly therapy and brain spotting for bulimia and bipolar disorder.  At improvement abdominal pain since starting probiotic.  Not using dicyclomine much at all.  Advised Desitin as needed for perineal irritation.  Advised to resume colestipol 4 g once to twice daily.  Continue  dicyclomine 20 mg as needed.  Continue famotidine once daily and Zofran as needed.  Avoid lactose.  Patient sent in a message at the end of February reporting stomach upset/pain.  Continues to have purging.  Tried dicyclomine 1 night and Levsin management.  Consider short course of Carafate for 2 weeks.  Advised to increase famotidine to twice daily.  Received notification the patient recently went to the ED at the beginning of March for abdominal pain and vomiting.  Reinforced the need to stop all NSAIDs.  Advised that she may need possible repeat EGD.  Nexium was sent in given intolerance to pantoprazole in the past due to severe diarrhea.  CT A/P 03/25/2022: No acute abnormality in the abdomen or pelvis, moderate hiatal hernia.  Patient sent MyChart message 04/19/2022 reporting severe pain to her abdomen.  She feels like it may be from her hiatal hernia.  Recommended scheduling an EGD and offered carafate.  Patient made an appointment in the office.  Today:  Reports when she is sitting up and is better when she lays down. Had some relief with mylanta. Has been eating shrimp and grilled chicken mostly. Did eat a hamburger patty and reports she had an upset stomach after eating it but then developed mid epigastric pain and had some mild nausea with this as well. Reports when she first went on pantoprazole in the past she had good relief of reflux symptoms but stopped due to pantoprazole. Has been maintained well recently on famotidine twice per day. Started on Nexium recently after ED episode given reflux type symptoms. No dysphagia.   Diarrhea is controlled on colestipol usually once day. Using the dicyclomine only when pain is severe.   Only 2 purging episodes in the last 2 weeks. Doing weight watchers currently. Has lost a few lbs.   Current Outpatient Medications  Medication Sig Dispense Refill   albuterol (VENTOLIN HFA) 108 (90 Base) MCG/ACT inhaler Inhale 2 puffs into the lungs every 6 (six)  hours as needed for wheezing or shortness of breath. 18 g 5   Brexpiprazole ([name]) 4 MG TABS Take 4 mg by mouth in the morning. 30 tablet 11   clonazePAM (KLONOPIN) 0.5 MG tablet Take 0.5 mg by mouth 3 (three) times daily as needed for anxiety.     colestipol (COLESTID) 1 g tablet Take 4 g by mouth daily.     cromolyn (OPTICROM) 4 % ophthalmic solution Place 1 drop into both eyes daily.     dicyclomine (BENTYL) 20 MG tablet Take 1 tablet (20 mg total) by mouth 3 (three) times daily as needed for spasms (Diarrhea). 30 tablet 1   esomeprazole (NEXIUM) 20 MG capsule Take 1 capsule (20 mg total) by mouth daily. 30 capsule 1   fluvoxaMINE (LUVOX) 100 MG tablet Take 4 tablets (400 mg total) by mouth at bedtime. (Patient taking differently: Take 200 mg by mouth 2 (two) times daily.) 360 tablet 1   gabapentin (NEURONTIN) 600 MG tablet 2 p.o. every morning, 1 p.o. q. afternoon, 2 p.o. nightly. 150 tablet 11   hydrOXYzine (ATARAX) 25 MG tablet Take 1-2   tablets (25-50 mg total) by mouth every 8 (eight) hours as needed. 90 tablet 11   Loratadine 10 MG CAPS Take 10 mg by mouth in the morning and at bedtime.     mirtazapine (REMERON) 15 MG tablet Take 1 tablet (15 mg total) by mouth at bedtime. 90 tablet 3   naproxen (NAPROSYN) 500 MG tablet Take 1,500 mg by mouth as needed for migraine.     ondansetron (ZOFRAN-ODT) 4 MG disintegrating tablet Take 1 tablet (4 mg total) by mouth every 8 (eight) hours as needed for nausea or vomiting. 30 tablet 0   pramipexole (MIRAPEX) 0.5 MG tablet Take 1 tablet (0.5 mg total) by mouth at bedtime as needed. 90 tablet 3   Probiotic Product (DAILY PROBIOTIC PO) Take 1 capsule by mouth daily at 6 (six) AM.     RESTASIS 0.05 % ophthalmic emulsion Place 1 drop into both eyes 2 (two) times daily.     Rimegepant Sulfate (NURTEC) 75 MG TBDP Take 75 mg by mouth daily as needed (take for abortive therapy of migraine, no more than 1 tablet in 24 hours or 8 per month). 8 tablet 11    tiZANidine (ZANAFLEX) 4 MG capsule Take 1 capsule (4 mg total) by mouth 3 (three) times daily as needed for muscle spasms. Do not drink alcohol or drive while taking this medication.  May cause drowsiness. 15 capsule 0   valACYclovir (VALTREX) 500 MG tablet Take 500 mg by mouth daily.     Famotidine-Ca Carb-Mag Hydrox (PEPCID COMPLETE PO) Take 1 tablet by mouth daily. (Patient not taking: Reported on 04/21/2022)     No current facility-administered medications for this visit.    Past Medical History:  Diagnosis Date   Abdominal pain, epigastric 10/07/2018   Acute non-recurrent maxillary sinusitis 11/07/2019   Anxiety    Arthritis    Binge-eating and purging type anorexia nervosa    Bipolar affective disorder 12/04/2015   Bipolar affective disorder, current episode manic without psychotic symptoms 12/04/2015   Bipolar disorder    Cellulitis and abscess of buttock 02/15/2013   Cystitis, interstitial    Depression    Depression    Phreesia 01/28/2020   Emphysema lung    Esophageal polyp    about 20 years ago   GERD (gastroesophageal reflux disease)    Left wrist pain 03/22/2019   OCD (obsessive compulsive disorder)    Palpitations 05/25/2014   Rectal pain 12/06/2018   Rib pain on right side 01/31/2019   Rosacea 03/27/2019   Sprain of temporomandibular joint or ligament 01/10/2019    Past Surgical History:  Procedure Laterality Date   BLADDER SURGERY     CHOLECYSTECTOMY     COLONOSCOPY WITH PROPOFOL N/A 10/05/2019   Procedure: COLONOSCOPY WITH PROPOFOL;  Surgeon: Rourk, Robert M, MD;  Location: [facility];  Service: Endoscopy;  Laterality: N/A;  12:45pm   COSMETIC SURGERY N/A    Phreesia 01/28/2020   ESOPHAGOGASTRODUODENOSCOPY  09/28/2016   [facility]; Dr. Karki; erosions in the esophagus, 5 cm hiatal hernia, nonbleeding erosive gastropathy s/p biopsied, normal duodenum.  Path with chronic inactive gastritis, no H. pylori or intestinal metaplasia.    ESOPHAGOGASTRODUODENOSCOPY  (EGD) WITH PROPOFOL N/A 10/17/2018   Dr. Rourk: mild reflux esophagitis, small hiatal hernia   POLYPECTOMY  10/05/2019   Procedure: POLYPECTOMY;  Surgeon: Rourk, Robert M, MD;  Location: [facility];  Service: Endoscopy;;   SVT ABLATION N/A 10/31/2021   Procedure: SVT ABLATION;  Surgeon: Mealor, Augustus E, MD;    Location: [facility];  Service: Cardiovascular;  Laterality: N/A;   VOCAL CORD LATERALIZATION, ENDOSCOPIC APPROACH W/ MLB      Family History  Problem Relation Age of Onset   Healthy Mother    Healthy Father    Migraines Paternal Grandmother    Colon cancer Neg Hx    Colon polyps Neg Hx     Allergies as of 04/21/2022 - Review Complete 04/21/2022  Allergen Reaction Noted   Codeine Shortness Of Breath, Nausea Only, and Rash 02/15/2013   Penicillins Hives, Rash, Shortness Of Breath, and Swelling 10/09/2011   Divalproex sodium Hives, Itching, and Rash 01/28/2020   Meloxicam Other (See Comments) and Nausea And Vomiting 05/08/2014   Sonata [zaleplon] Other (See Comments) 10/22/2017   Topamax [topiramate] Other (See Comments) 10/04/2018   Aciphex [rabeprazole] Swelling 07/16/2020   Aimovig [erenumab-aooe] Itching 06/26/2019   Galcanezumab-gnlm Rash and Hives 10/04/2018    Social History   Socioeconomic History   Marital status: Married    Spouse name: Todd   Number of children: 0   Years of education: Not on file   Highest education level: Not on file  Occupational History   Not on file  Tobacco Use   Smoking status: Every Day    Packs/day: 1.00    Years: 25.00    Additional pack years: 0.00    Total pack years: 25.00    Types: Cigarettes    Start date: 01/20/1988    Last attempt to quit: 11/12/2021    Years since quitting: 0.4   Smokeless tobacco: Never  Vaping Use   Vaping Use: Never used  Substance and Sexual Activity   Alcohol use: No    Alcohol/week: 0.0 standard drinks of alcohol   Drug use: No   Sexual activity: Yes    Birth control/protection:  None  Other Topics Concern   Not on file  Social History Narrative   Lives with husband -Todd of 15 years       [employer]- [place]      Enjoys: reading-all genres      Diet: eats all food groups   Caffeine: 1 cup daily at times, diet dr pepper daily   Water: gatorade  zero and water       Wears seat belt    Does not use phone while driving   Smoke and Carbon Detectors at home   Fire Extinguisher   Weapons  -safe area         Social Determinants of Health   Financial Resource Strain: Low Risk  (10/16/2021)   Overall Financial Resource Strain (CARDIA)    Difficulty of Paying Living Expenses: Not hard at all  Food Insecurity: No Food Insecurity (10/16/2021)   Hunger Vital Sign    Worried About Running Out of Food in the Last Year: Never true    Ran Out of Food in the Last Year: Never true  Transportation Needs: No Transportation Needs (10/16/2021)   PRAPARE - Transportation    Lack of Transportation (Medical): No    Lack of Transportation (Non-Medical): No  Physical Activity: Inactive (10/16/2021)   Exercise Vital Sign    Days of Exercise per Week: 0 days    Minutes of Exercise per Session: 0 min  Stress: Stress Concern Present (10/16/2021)   [employer] - Occupational Stress Questionnaire    Feeling of Stress : Very much  Social Connections: Socially Integrated (10/16/2021)   Social Connection and Isolation Panel [NHANES]    Frequency   of Communication with Friends and Family: More than three times a week    Frequency of Social Gatherings with Friends and Family: Once a week    Attends Religious Services: More than 4 times per year    Active Member of Clubs or Organizations: Yes    Attends Club or Organization Meetings: More than 4 times per year    Marital Status: Married     Review of Systems   Gen: Denies fever, chills, anorexia. Denies fatigue, weakness, weight loss.  CV: Denies chest pain, palpitations, syncope, peripheral edema, and  claudication. Resp: Denies dyspnea at rest, cough, wheezing, coughing up blood, and pleurisy. GI: See HPI Derm: Denies rash, itching, dry skin Psych: Denies depression, anxiety, memory loss, confusion. No homicidal or suicidal ideation.  Heme: Denies bruising, bleeding, and enlarged lymph nodes.   Physical Exam   BP 114/75 (BP Location: Left Arm, Patient Position: Sitting, Cuff Size: Large)   Pulse 84   Temp (!) 97.1 F (36.2 C) (Temporal)   Ht 5' 4" (1.626 m)   Wt 173 lb 11.2 oz (78.8 kg)   LMP 10/07/2011   BMI 29.82 kg/m   General:   Alert and oriented. No distress noted. Pleasant and cooperative.  Head:  Normocephalic and atraumatic. Eyes:  Conjuctiva clear without scleral icterus. Mouth:  Oral mucosa pink and moist. Good dentition. No lesions. Lungs:  Clear to auscultation bilaterally. No wheezes, rales, or rhonchi. No distress.  Heart:  S1, S2 present without murmurs appreciated.  Abdomen:  +BS, soft,  non-distended. TTP to epigastrium. Mild ttp to lower abdomen. No rebound or guarding. No HSM or masses noted. Rectal: deferred Msk:  Symmetrical without gross deformities. Normal posture. Extremities:  Without edema. Neurologic:  Alert and  oriented x4 Psych:  Alert and cooperative. Normal mood and affect.   Assessment  Jennifer [name] is a 56 y.o. female with a history of anxiety, GERD, bulimia, fatty liver, depression, bipolar 1, bulimia, arthritis, and chronic diarrhea presenting today with complaint of  epigastric abdominal pain.   Chronic diarrhea: Controlled with colestipol 4 g once to twice daily.  Still has some occasional lower abdominal cramping and if needed she will use dicyclomine.  She reduces her dose if she has any constipation.  GERD, Nausea, dry heaves, upper abdominal pain: Chronic history of bulimia, continues to follow with psychology for therapy and brain spotting.  Reports he has only had 2 purge episodes within the last couple of weeks.  No current  abdominal spasms but does report some intermittent pain that occurs.  She went to the ED earlier in March for mid epigastric pain and had CT scan that revealed moderate hiatal hernia.  She was advised to stop NSAIDs.  She has been using Mylanta with some relief.  Patient reported recurrent abdominal pain over the weekend after eating a hamburger therefore recommended for her to start Nexium in addition to her famotidine given her symptoms are fairly consistent with uncontrolled reflux.  Her last upper endoscopy was in 2020 with a small hiatal hernia noted and mild erosive reflux esophagitis.  Suspect her symptoms are combination of her hiatal hernia that appears to increase in size as well as dietary indiscretions.  Given it has been over 3 years since her last upper endoscopy she continues to have recurrent symptoms we will further evaluate with another upper endoscopy to assess for gastritis, esophagitis, duodenitis, peptic ulcer disease.  For now I recommended for her to continue Nexium once daily and if   she feels like she needs that she can continue with Pepcid once to twice daily.  Advised strict GERD diet and avoiding fatty/fried foods.   PLAN   Proceed with upper endoscopy with propofol by Dr. Rourk in near future: the risks, benefits, and alternatives have been discussed with the patient in detail. The patient states understanding and desires to proceed. ASA 3 Avoid NSAIDs Strict GERD diet/lifestyle modifications Continue Nexium 20 mg once daily.  Continue Famotidine 20 mg one to two times daily Continue Colestipol 4g once daily.  Continue dicyclomine 20 mg up to TID as needed for diarrhea and abdominal spasms.  Follow up 6-8 weeks post procedure.    Eiliyah Reh, MSN, FNP-BC, AGACNP-BC [facility] 

## 2022-04-21 ENCOUNTER — Ambulatory Visit (INDEPENDENT_AMBULATORY_CARE_PROVIDER_SITE_OTHER): Payer: Medicare PPO | Admitting: Gastroenterology

## 2022-04-21 ENCOUNTER — Telehealth (INDEPENDENT_AMBULATORY_CARE_PROVIDER_SITE_OTHER): Payer: Self-pay | Admitting: Internal Medicine

## 2022-04-21 ENCOUNTER — Encounter: Payer: Self-pay | Admitting: Gastroenterology

## 2022-04-21 VITALS — BP 114/75 | HR 84 | Temp 97.1°F | Ht 64.0 in | Wt 173.7 lb

## 2022-04-21 DIAGNOSIS — R112 Nausea with vomiting, unspecified: Secondary | ICD-10-CM

## 2022-04-21 DIAGNOSIS — K529 Noninfective gastroenteritis and colitis, unspecified: Secondary | ICD-10-CM

## 2022-04-21 DIAGNOSIS — R1010 Upper abdominal pain, unspecified: Secondary | ICD-10-CM

## 2022-04-21 DIAGNOSIS — K449 Diaphragmatic hernia without obstruction or gangrene: Secondary | ICD-10-CM | POA: Diagnosis not present

## 2022-04-21 DIAGNOSIS — K2100 Gastro-esophageal reflux disease with esophagitis, without bleeding: Secondary | ICD-10-CM

## 2022-04-21 DIAGNOSIS — F502 Bulimia nervosa: Secondary | ICD-10-CM | POA: Diagnosis not present

## 2022-04-21 DIAGNOSIS — R1013 Epigastric pain: Secondary | ICD-10-CM

## 2022-04-21 NOTE — Telephone Encounter (Signed)
PA via [employer] for EGD  Approved Authorization [id number]  Tracking [id number] Dates of service 04/21/2022 - 07/21/2022

## 2022-04-21 NOTE — Patient Instructions (Signed)
Follow a GERD diet:  Avoid fried, fatty, greasy, spicy, citrus foods. Avoid caffeine and carbonated beverages. Avoid chocolate. Try eating 4-6 small meals a day rather than 3 large meals. Do not eat within 3 hours of laying down. Prop head of bed up on wood or bricks to create a 6 inch incline.  AVOID NSAIDs (Aleve, Advil, ibuprofen, BC, or Goody powders)  Continue Nexium 20 mg once daily.  You may resume famotidine 20 mg once to twice daily if needed.  Continue colestipol once to twice daily as needed for diarrhea.  May continue to use dicyclomine up to 3 times daily as needed for diarrhea and abdominal spasms.  We are scheduling for an upper endoscopy in the near future with Dr. Gala Romney.  It was a pleasure to see you today. I want to create trusting relationships with patients. If you receive a survey regarding your visit,  I greatly appreciate you taking time to fill this out on paper or through your MyChart. I value your feedback.  [name], MSN, FNP-BC, AGACNP-BC [facility]

## 2022-04-22 DIAGNOSIS — F4310 Post-traumatic stress disorder, unspecified: Secondary | ICD-10-CM | POA: Diagnosis not present

## 2022-04-22 DIAGNOSIS — F502 Bulimia nervosa: Secondary | ICD-10-CM | POA: Diagnosis not present

## 2022-04-22 DIAGNOSIS — F429 Obsessive-compulsive disorder, unspecified: Secondary | ICD-10-CM | POA: Diagnosis not present

## 2022-04-22 DIAGNOSIS — F319 Bipolar disorder, unspecified: Secondary | ICD-10-CM | POA: Diagnosis not present

## 2022-04-30 ENCOUNTER — Telehealth (INDEPENDENT_AMBULATORY_CARE_PROVIDER_SITE_OTHER): Payer: Medicare PPO | Admitting: Physician Assistant

## 2022-04-30 ENCOUNTER — Encounter: Payer: Self-pay | Admitting: Physician Assistant

## 2022-04-30 DIAGNOSIS — F411 Generalized anxiety disorder: Secondary | ICD-10-CM

## 2022-04-30 DIAGNOSIS — F429 Obsessive-compulsive disorder, unspecified: Secondary | ICD-10-CM

## 2022-04-30 DIAGNOSIS — F502 Bulimia nervosa: Secondary | ICD-10-CM

## 2022-04-30 DIAGNOSIS — F319 Bipolar disorder, unspecified: Secondary | ICD-10-CM

## 2022-04-30 MED ORDER — TRAZODONE HCL 100 MG PO TABS: 100.0000 mg | ORAL_TABLET | Freq: Every evening | ORAL | 1 refills | Status: DC | PRN

## 2022-04-30 MED ORDER — FLUVOXAMINE MALEATE 100 MG PO TABS: 200.0000 mg | ORAL_TABLET | Freq: Two times a day (BID) | ORAL | 3 refills | Status: DC

## 2022-04-30 MED ORDER — CLONAZEPAM 0.5 MG PO TABS: 0.5000 mg | ORAL_TABLET | Freq: Three times a day (TID) | ORAL | 1 refills | Status: DC | PRN

## 2022-04-30 NOTE — Progress Notes (Signed)
[facility]  Patient ID: Jennifer [name],  MRN: [id number]  PCP: Anabel Halon, MD  Date of Evaluation: 04/30/2022 time spent:20 minutes  Chief Complaint:  Chief Complaint   Depression    Virtual Visit via Telehealth  I connected with patient by a video enabled telemedicine application, with their informed consent, and verified patient privacy and that I am speaking with the correct person using two identifiers.  I am private, in my office and the patient is at home.  I discussed the limitations, risks, security and privacy concerns of performing an evaluation and management service by video and the availability of in person appointments. I also discussed with the patient that there may be a patient responsible charge related to this service. The patient expressed understanding and agreed to proceed.   I discussed the assessment and treatment plan with the patient. The patient was provided an opportunity to ask questions and all were answered. The patient agreed with the plan and demonstrated an understanding of the instructions.   The patient was advised to call back or seek an in-person evaluation if the symptoms worsen or if the condition fails to improve as anticipated.  I provided 20 minutes of non-face-to-face time during this encounter.  HISTORY/CURRENT STATUS: HPI for routine follow-up.  Has been sick with a cold, but is much better. Not sleeping well though.  She does take the mirtazapine only as needed and it is not as effective as it used to be.  She still lays awake for an hour or more sometimes or she will go to sleep and then wake up, and repeats every few hours.  She is able to go back to sleep but does not feel like she is getting restful sleep.  Patient is able to enjoy things.  Energy and motivation are good.  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  No extreme sadness, tearfulness, or feelings of  hopelessness. ADLs and personal hygiene are normal.   Denies any changes in concentration, making decisions, or remembering things.   Denies cutting or any form of self-harm.  Denies suicidal or homicidal thoughts.  She has not binged or purged in approximately 2 weeks.  Brain spotting has been very helpful.  She is in weight watcher's, has lost about 10 pounds in the past 3 weeks.  Does not have a lot of anxiety.  She occasionally needs the Klonopin but not too often.  Denies dizziness, syncope, seizures, numbness, tingling, tremor, tics, unsteady gait, slurred speech, confusion.  No akathisia.  The mirtazapine has really helped that.  Has chronic joint pain. Denies unexplained weight loss, frequent infections, or sores that heal slowly.  No polyphagia, polydipsia, or polyuria. Denies visual changes or paresthesias.   Individual Medical History/ Review of Systems: Changes? :Yes   see HPI     Past medications for mental health diagnoses include: Risperdal, Seroquel, Prozac, Zoloft,  Wellbutrin, Lamictal, Depakote caused hair loss, Xanax, Ambien, trazodone, Trileptal, Luvox, Topamax, Elavil, Pamelor, BuSpar, doxazosin, prazosin, lithium, Vraylar, Abilify, [name], Latuda >60 mg caused abd pain and nausea, propranolol caused diarrhea  Allergies: Codeine, Penicillins, Divalproex sodium, Meloxicam, Sonata [zaleplon], Topamax [topiramate], Aciphex [rabeprazole], Aimovig [erenumab-aooe], and Galcanezumab-gnlm  Current Medications:  Current Outpatient Medications:    albuterol (VENTOLIN HFA) 108 (90 Base) MCG/ACT inhaler, Inhale 2 puffs into the lungs every 6 (six) hours as needed for wheezing or shortness of breath., Disp: 18 g, Rfl: 5   Brexpiprazole (  [name]) 4 MG TABS, Take 4 mg by mouth in the morning., Disp: 30 tablet, Rfl: 11   colestipol (COLESTID) 1 g tablet, Take 4 g by mouth daily., Disp: , Rfl:    cromolyn (OPTICROM) 4 % ophthalmic solution, Place 1 drop into both eyes daily., Disp: , Rfl:     dicyclomine (BENTYL) 20 MG tablet, Take 1 tablet (20 mg total) by mouth 3 (three) times daily as needed for spasms (Diarrhea)., Disp: 30 tablet, Rfl: 1   esomeprazole (NEXIUM) 20 MG capsule, Take 1 capsule (20 mg total) by mouth daily., Disp: 30 capsule, Rfl: 1   gabapentin (NEURONTIN) 600 MG tablet, 2 p.o. every morning, 1 p.o. q. afternoon, 2 p.o. nightly., Disp: 150 tablet, Rfl: 11   hydrOXYzine (ATARAX) 25 MG tablet, Take 1-2 tablets (25-50 mg total) by mouth every 8 (eight) hours as needed., Disp: 90 tablet, Rfl: 11   Loratadine 10 MG CAPS, Take 10 mg by mouth in the morning and at bedtime., Disp: , Rfl:    mirtazapine (REMERON) 15 MG tablet, Take 1 tablet (15 mg total) by mouth at bedtime., Disp: 90 tablet, Rfl: 3   naproxen (NAPROSYN) 500 MG tablet, Take 1,500 mg by mouth as needed for migraine., Disp: , Rfl:    ondansetron (ZOFRAN-ODT) 4 MG disintegrating tablet, Take 1 tablet (4 mg total) by mouth every 8 (eight) hours as needed for nausea or vomiting., Disp: 30 tablet, Rfl: 0   pramipexole (MIRAPEX) 0.5 MG tablet, Take 1 tablet (0.5 mg total) by mouth at bedtime as needed., Disp: 90 tablet, Rfl: 3   Probiotic Product (DAILY PROBIOTIC PO), Take 1 capsule by mouth daily at 6 (six) AM., Disp: , Rfl:    RESTASIS 0.05 % ophthalmic emulsion, Place 1 drop into both eyes 2 (two) times daily., Disp: , Rfl:    Rimegepant Sulfate (NURTEC) 75 MG TBDP, Take 75 mg by mouth daily as needed (take for abortive therapy of migraine, no more than 1 tablet in 24 hours or 8 per month)., Disp: 8 tablet, Rfl: 11   tiZANidine (ZANAFLEX) 4 MG capsule, Take 1 capsule (4 mg total) by mouth 3 (three) times daily as needed for muscle spasms. Do not drink alcohol or drive while taking this medication.  May cause drowsiness., Disp: 15 capsule, Rfl: 0   traZODone (DESYREL) 100 MG tablet, Take 1-2 tablets (100-200 mg total) by mouth at bedtime as needed for sleep., Disp: 60 tablet, Rfl: 1   valACYclovir (VALTREX) 500 MG  tablet, Take 500 mg by mouth daily., Disp: , Rfl:    clonazePAM (KLONOPIN) 0.5 MG tablet, Take 1 tablet (0.5 mg total) by mouth 3 (three) times daily as needed for anxiety., Disp: 30 tablet, Rfl: 1   Famotidine-Ca Carb-Mag Hydrox (PEPCID COMPLETE PO), Take 1 tablet by mouth daily. (Patient not taking: Reported on 04/21/2022), Disp: , Rfl:    fluvoxaMINE (LUVOX) 100 MG tablet, Take 2 tablets (200 mg total) by mouth 2 (two) times daily., Disp: 360 tablet, Rfl: 3 Medication Side Effects: none  Family Medical/ Social History: Changes?  No  MENTAL HEALTH EXAM:  Last menstrual period 10/07/2011.There is no height or weight on file to calculate BMI.  General Appearance: Casual and Well Groomed  Eye Contact:  Good  Speech:  Clear and Coherent and Normal Rate  Volume:  Normal  Mood:  Euthymic  Affect:  Congruent  Thought Process:  Goal Directed and Descriptions of Associations: Circumstantial  Orientation:  Full (Time, Place, and Person)  Thought  Content: Logical   Suicidal Thoughts:  No  Homicidal Thoughts:  No  Memory:  WNL  Judgement:  Good  Insight:  Good  Psychomotor Activity:  Normal  Concentration:  Concentration: Good and Attention Span: Good  Recall:  Good  Fund of Knowledge: Good  Language: Good  Assets:  Desire for Improvement Financial Resources/Insurance Housing Resilience Transportation  ADL's:  Intact  Cognition: WNL  Prognosis:  Good   DIAGNOSES:    ICD-10-CM   1. Bipolar I disorder  F31.9     2. Obsessive-compulsive disorder, unspecified type  F42.9     3. Bulimia  F50.2     4. Generalized anxiety disorder  F41.1      Receiving Psychotherapy: Yes  Dominic PeaSarah Solomon, and another therapist for brain-spotting  RECOMMENDATIONS:  PDMP was reviewed.  Last Klonopin filled 09/09/2021. I provided 20 minutes of non-face-to-face time during this encounter, including time spent before and after the visit in records review, medical decision making, counseling pertinent to  today's visit, and charting.   Sleep hygiene discussed.  She has taken trazodone in the past which was helpful for sleep.  We decided to discontinue the mirtazapine for now and switch back to trazodone.  She was originally prescribed mirtazapine due to akathisia.  That does not seem to be a problem anymore even without taking the mirtazapine, which she has only been doing as needed for a while now.  She will let me know if akathisia recurs.  She knows what to watch for.  Continue [name] 4 mg, 1 p.o. every morning. Continue Klonopin 0.5 mg, 1 p.o. twice daily prn. Continue Luvox 100 mg, 2 p.o. twice daily.   Continue gabapentin 600 mg, 2 qam.,  3 p.o. nightly.  Continue hydroxyzine 25-50 mg, 1 p.o. 3 times daily as needed Continue pramipexole 0.5 mg nightly.  Per another provider. Restart trazodone 100 mg, 1-2 nightly as needed sleep. Healthy diet and exercise discussed. Continue therapy. Return in 3 months.  Melony Overlyeresa Lekeshia Kram, PA-C

## 2022-05-04 DIAGNOSIS — F4310 Post-traumatic stress disorder, unspecified: Secondary | ICD-10-CM | POA: Diagnosis not present

## 2022-05-04 DIAGNOSIS — F502 Bulimia nervosa: Secondary | ICD-10-CM | POA: Diagnosis not present

## 2022-05-04 DIAGNOSIS — F319 Bipolar disorder, unspecified: Secondary | ICD-10-CM | POA: Diagnosis not present

## 2022-05-04 DIAGNOSIS — F429 Obsessive-compulsive disorder, unspecified: Secondary | ICD-10-CM | POA: Diagnosis not present

## 2022-05-06 DIAGNOSIS — F429 Obsessive-compulsive disorder, unspecified: Secondary | ICD-10-CM | POA: Diagnosis not present

## 2022-05-06 DIAGNOSIS — F319 Bipolar disorder, unspecified: Secondary | ICD-10-CM | POA: Diagnosis not present

## 2022-05-06 DIAGNOSIS — F502 Bulimia nervosa: Secondary | ICD-10-CM | POA: Diagnosis not present

## 2022-05-06 DIAGNOSIS — F4310 Post-traumatic stress disorder, unspecified: Secondary | ICD-10-CM | POA: Diagnosis not present

## 2022-05-13 DIAGNOSIS — F319 Bipolar disorder, unspecified: Secondary | ICD-10-CM | POA: Diagnosis not present

## 2022-05-13 DIAGNOSIS — F502 Bulimia nervosa: Secondary | ICD-10-CM | POA: Diagnosis not present

## 2022-05-13 DIAGNOSIS — F4310 Post-traumatic stress disorder, unspecified: Secondary | ICD-10-CM | POA: Diagnosis not present

## 2022-05-13 DIAGNOSIS — F429 Obsessive-compulsive disorder, unspecified: Secondary | ICD-10-CM | POA: Diagnosis not present

## 2022-05-13 NOTE — Telephone Encounter (Signed)
Pt aware of pre op appt for 05/18/22

## 2022-05-18 ENCOUNTER — Encounter (HOSPITAL_COMMUNITY)
Admission: RE | Admit: 2022-05-18 | Discharge: 2022-05-18 | Disposition: A | Discharge status: Home / Self Care | Payer: Medicare PPO | Source: Ambulatory Visit | Attending: Internal Medicine | Admitting: Internal Medicine

## 2022-05-18 ENCOUNTER — Encounter (HOSPITAL_COMMUNITY): Payer: Self-pay

## 2022-05-18 HISTORY — DX: Essential (primary) hypertension: I10

## 2022-05-18 HISTORY — DX: Cardiac arrhythmia, unspecified: I49.9

## 2022-05-18 NOTE — Patient Instructions (Signed)
Jennifer [name]  05/18/2022     @PREFPERIOPPHARMACY @   Your procedure is scheduled on  05/20/2022.   Report to [facility] at  0600 A.M.   Call this number if you have problems the morning of surgery:  [phone]  If you experience any cold or flu symptoms such as cough, fever, chills, shortness of breath, etc. between now and your scheduled surgery, please notify us at the above number.   Remember:  Follow the diet instructions given to you by the office.     Take these medicines the morning of surgery with A SIP OF WATER        clonazepam, nexium, luvox, gabapentin, claritin, zofran(if needed), nurtec, zanaflex.       Use your inhaler before you come and bring your rescue inhaler with you.    Do not wear jewelry, make-up or nail polish.  Do not wear lotions, powders, or perfumes, or deodorant.  Do not shave 48 hours prior to surgery.  Men may shave face and neck.  Do not bring valuables to the hospital.  [facility] is not responsible for any belongings or valuables.  Contacts, dentures or bridgework may not be worn into surgery.  Leave your suitcase in the car.  After surgery it may be brought to your room.  For patients admitted to the hospital, discharge time will be determined by your treatment team.  Patients discharged the day of surgery will not be allowed to drive home and must have someone with them for 24 hours.    Special instructions:   DO NOT smoke tobacco or vape for 24 hours before your procedure.  Please read over the following fact sheets that you were given. Anesthesia Post-op Instructions and Care and Recovery After Surgery      Upper Endoscopy, Adult, Care After After the procedure, it is common to have a sore throat. It is also common to have: Mild stomach pain or discomfort. Bloating. Nausea. Follow these instructions at home: The instructions below may help you care for yourself at home. Your health care provider may give you more  instructions. If you have questions, ask your health care provider. If you were given a sedative during the procedure, it can affect you for several hours. Do not drive or operate machinery until your health care provider says that it is safe. If you will be going home right after the procedure, plan to have a responsible adult: Take you home from the hospital or clinic. You will not be allowed to drive. Care for you for the time you are told. Follow instructions from your health care provider about what you may eat and drink. Return to your normal activities as told by your health care provider. Ask your health care provider what activities are safe for you. Take over-the-counter and prescription medicines only as told by your health care provider. Contact a health care provider if you: Have a sore throat that lasts longer than one day. Have trouble swallowing. Have a fever. Get help right away if you: Vomit blood or your vomit looks like coffee grounds. Have bloody, black, or tarry stools. Have a very bad sore throat or you cannot swallow. Have difficulty breathing or very bad pain in your chest or abdomen. These symptoms may be an emergency. Get help right away. Call 911. Do not wait to see if the symptoms will go away. Do not drive yourself to the hospital. Summary After the procedure, it  is common to have a sore throat, mild stomach discomfort, bloating, and nausea. If you were given a sedative during the procedure, it can affect you for several hours. Do not drive until your health care provider says that it is safe. Follow instructions from your health care provider about what you may eat and drink. Return to your normal activities as told by your health care provider. This information is not intended to replace advice given to you by your health care provider. Make sure you discuss any questions you have with your health care provider. Document Revised: 04/16/2021 Document Reviewed:  04/16/2021 Elsevier Patient Education  2023 Else[employer] Monitored Anesthesia Care, Care After The following information offers guidance on how to care for yourself after your procedure. Your health care provider may also give you more specific instructions. If you have problems or questions, contact your health care provider. What can I expect after the procedure? After the procedure, it is common to have: Tiredness. Little or no memory about what happened during or after the procedure. Impaired judgment when it comes to making decisions. Nausea or vomiting. Some trouble with balance. Follow these instructions at home: For the time period you were told by your health care provider:  Rest. Do not participate in activities where you could fall or become injured. Do not drive or use machinery. Do not drink alcohol. Do not take sleeping pills or medicines that cause drowsiness. Do not make important decisions or sign legal documents. Do not take care of children on your own. Medicines Take over-the-counter and prescription medicines only as told by your health care provider. If you were prescribed antibiotics, take them as told by your health care provider. Do not stop using the antibiotic even if you start to feel better. Eating and drinking Follow instructions from your health care provider about what you may eat and drink. Drink enough fluid to keep your urine pale yellow. If you vomit: Drink clear fluids slowly and in small amounts as you are able. Clear fluids include water, ice chips, low-calorie sports drinks, and fruit juice that has water added to it (diluted fruit juice). Eat light and bland foods in small amounts as you are able. These foods include bananas, applesauce, rice, lean meats, toast, and crackers. General instructions  Have a responsible adult stay with you for the time you are told. It is important to have someone help care for you until you are awake and  alert. If you have sleep apnea, surgery and some medicines can increase your risk for breathing problems. Follow instructions from your health care provider about wearing your sleep device: When you are sleeping. This includes during daytime naps. While taking prescription pain medicines, sleeping medicines, or medicines that make you drowsy. Do not use any products that contain nicotine or tobacco. These products include cigarettes, chewing tobacco, and vaping devices, such as e-cigarettes. If you need help quitting, ask your health care provider. Contact a health care provider if: You feel nauseous or vomit every time you eat or drink. You feel light-headed. You are still sleepy or having trouble with balance after 24 hours. You get a rash. You have a fever. You have redness or swelling around the IV site. Get help right away if: You have trouble breathing. You have new confusion after you get home. These symptoms may be an emergency. Get help right away. Call 911. Do not wait to see if the symptoms will go away. Do not drive yourself to the  hospital. This information is not intended to replace advice given to you by your health care provider. Make sure you discuss any questions you have with your health care provider. Document Revised: 06/02/2021 Document Reviewed: 06/02/2021 Elsevier Patient Education  [employer]

## 2022-05-20 ENCOUNTER — Ambulatory Visit (HOSPITAL_COMMUNITY): Payer: Medicare PPO | Admitting: Certified Registered Nurse Anesthetist

## 2022-05-20 ENCOUNTER — Ambulatory Visit (HOSPITAL_BASED_OUTPATIENT_CLINIC_OR_DEPARTMENT_OTHER): Payer: Medicare PPO | Admitting: Certified Registered Nurse Anesthetist

## 2022-05-20 ENCOUNTER — Ambulatory Visit (HOSPITAL_COMMUNITY)
Admission: RE | Admit: 2022-05-20 | Discharge: 2022-05-20 | Disposition: A | Discharge status: Home / Self Care | Payer: Medicare PPO | Attending: Internal Medicine | Admitting: Internal Medicine

## 2022-05-20 ENCOUNTER — Encounter (HOSPITAL_COMMUNITY)
Admission: RE | Disposition: A | Discharge status: Home / Self Care | Payer: Self-pay | Source: Home / Self Care | Attending: Internal Medicine

## 2022-05-20 ENCOUNTER — Encounter (HOSPITAL_COMMUNITY): Payer: Self-pay | Admitting: Internal Medicine

## 2022-05-20 ENCOUNTER — Other Ambulatory Visit: Payer: Self-pay

## 2022-05-20 DIAGNOSIS — F502 Bulimia nervosa: Secondary | ICD-10-CM | POA: Insufficient documentation

## 2022-05-20 DIAGNOSIS — R1013 Epigastric pain: Secondary | ICD-10-CM | POA: Diagnosis not present

## 2022-05-20 DIAGNOSIS — K529 Noninfective gastroenteritis and colitis, unspecified: Secondary | ICD-10-CM | POA: Diagnosis not present

## 2022-05-20 DIAGNOSIS — J439 Emphysema, unspecified: Secondary | ICD-10-CM | POA: Insufficient documentation

## 2022-05-20 DIAGNOSIS — F319 Bipolar disorder, unspecified: Secondary | ICD-10-CM | POA: Insufficient documentation

## 2022-05-20 DIAGNOSIS — J449 Chronic obstructive pulmonary disease, unspecified: Secondary | ICD-10-CM | POA: Diagnosis not present

## 2022-05-20 DIAGNOSIS — R112 Nausea with vomiting, unspecified: Secondary | ICD-10-CM | POA: Diagnosis not present

## 2022-05-20 DIAGNOSIS — Z6829 Body mass index (BMI) 29.0-29.9, adult: Secondary | ICD-10-CM | POA: Insufficient documentation

## 2022-05-20 DIAGNOSIS — K449 Diaphragmatic hernia without obstruction or gangrene: Secondary | ICD-10-CM | POA: Diagnosis not present

## 2022-05-20 DIAGNOSIS — F17210 Nicotine dependence, cigarettes, uncomplicated: Secondary | ICD-10-CM | POA: Insufficient documentation

## 2022-05-20 DIAGNOSIS — I739 Peripheral vascular disease, unspecified: Secondary | ICD-10-CM | POA: Insufficient documentation

## 2022-05-20 DIAGNOSIS — I10 Essential (primary) hypertension: Secondary | ICD-10-CM | POA: Diagnosis not present

## 2022-05-20 DIAGNOSIS — K219 Gastro-esophageal reflux disease without esophagitis: Secondary | ICD-10-CM | POA: Diagnosis not present

## 2022-05-20 DIAGNOSIS — Z79899 Other long term (current) drug therapy: Secondary | ICD-10-CM | POA: Insufficient documentation

## 2022-05-20 DIAGNOSIS — F419 Anxiety disorder, unspecified: Secondary | ICD-10-CM | POA: Insufficient documentation

## 2022-05-20 DIAGNOSIS — K760 Fatty (change of) liver, not elsewhere classified: Secondary | ICD-10-CM | POA: Insufficient documentation

## 2022-05-20 HISTORY — PX: ESOPHAGOGASTRODUODENOSCOPY (EGD) WITH PROPOFOL: SHX5813

## 2022-05-20 SURGERY — ESOPHAGOGASTRODUODENOSCOPY (EGD) WITH PROPOFOL
Anesthesia: General

## 2022-05-20 MED ORDER — PROPOFOL 500 MG/50ML IV EMUL
INTRAVENOUS | Status: DC | PRN
  Administered 2022-05-20: 150 ug/kg/min via INTRAVENOUS

## 2022-05-20 MED ORDER — LACTATED RINGERS IV SOLN: INTRAVENOUS | Status: DC

## 2022-05-20 MED ORDER — PROPOFOL 10 MG/ML IV BOLUS
INTRAVENOUS | Status: DC | PRN
  Administered 2022-05-20: 80 mg via INTRAVENOUS

## 2022-05-20 MED ORDER — SUCRALFATE 1 G PO TABS: ORAL_TABLET | ORAL | 3 refills | Status: DC

## 2022-05-20 MED ORDER — PROPOFOL 500 MG/50ML IV EMUL
INTRAVENOUS | Status: AC
  Filled 2022-05-20: qty 100

## 2022-05-20 MED ORDER — LIDOCAINE 2% (20 MG/ML) 5 ML SYRINGE
INTRAMUSCULAR | Status: DC | PRN
  Administered 2022-05-20: 50 mg via INTRAVENOUS

## 2022-05-20 MED ORDER — LIDOCAINE HCL (PF) 2 % IJ SOLN
INTRAMUSCULAR | Status: AC
  Filled 2022-05-20: qty 5

## 2022-05-20 MED ORDER — DEXMEDETOMIDINE HCL IN NACL 80 MCG/20ML IV SOLN
INTRAVENOUS | Status: AC
  Filled 2022-05-20: qty 20

## 2022-05-20 NOTE — Transfer of Care (Signed)
Immediate Anesthesia Transfer of Care Note  Patient: Jennifer [name]  Procedure(s) Performed: ESOPHAGOGASTRODUODENOSCOPY (EGD) WITH PROPOFOL  Patient Location: Short Stay  Anesthesia Type:General  Level of Consciousness: sedated, drowsy, and responds to stimulation  Airway & Oxygen Therapy: Patient Spontanous Breathing  Post-op Assessment: Report given to RN, Patient moving all extremities X 4, and Patient able to stick tongue midline  Post vital signs: Reviewed  Last Vitals:  Vitals Value Taken Time  BP 87/45 05/20/22 0748  Temp 97.6   Pulse 62 05/20/22 0748  Resp 14 05/20/22 0748  SpO2 92 % 05/20/22 0748    Last Pain:  Vitals:   05/20/22 0748  TempSrc:   PainSc: 0-No pain      Patients Stated Pain Goal: 7 (05/20/22 0626)  Complications: No notable events documented.

## 2022-05-20 NOTE — Anesthesia Preprocedure Evaluation (Signed)
Anesthesia Evaluation  Patient identified by MRN, date of birth, ID band Patient awake    Reviewed: Allergy & Precautions, H&P , NPO status , Patient's Chart, lab work & pertinent test results, reviewed documented beta blocker date and time   Airway Mallampati: II  TM Distance: >3 FB Neck ROM: full    Dental no notable dental hx.    Pulmonary neg pulmonary ROS, COPD, Current Smoker   Pulmonary exam normal breath sounds clear to auscultation       Cardiovascular Exercise Tolerance: Good hypertension, + Peripheral Vascular Disease  negative cardio ROS + dysrhythmias  Rhythm:regular Rate:Normal     Neuro/Psych  Headaches PSYCHIATRIC DISORDERS Anxiety Depression Bipolar Disorder    Neuromuscular disease negative neurological ROS  negative psych ROS   GI/Hepatic negative GI ROS, Neg liver ROS,GERD  ,,  Endo/Other  negative endocrine ROS    Renal/GU negative Renal ROS  negative genitourinary   Musculoskeletal   Abdominal   Peds  Hematology negative hematology ROS (+)   Anesthesia Other Findings   Reproductive/Obstetrics negative OB ROS                             Anesthesia Physical Anesthesia Plan  ASA: 3  Anesthesia Plan: General   Post-op Pain Management:    Induction:   PONV Risk Score and Plan: Propofol infusion  Airway Management Planned:   Additional Equipment:   Intra-op Plan:   Post-operative Plan:   Informed Consent: I have reviewed the patients History and Physical, chart, labs and discussed the procedure including the risks, benefits and alternatives for the proposed anesthesia with the patient or authorized representative who has indicated his/her understanding and acceptance.     Dental Advisory Given  Plan Discussed with: CRNA  Anesthesia Plan Comments:        Anesthesia Quick Evaluation

## 2022-05-20 NOTE — Op Note (Signed)
Jennifer [name] Patient Name: Jennifer [name] Procedure Date: 05/20/2022 7:05 AM MRN: [id number] Date of Birth: 07-02-1965 Attending Jennifer [name]: Jennifer [name] , Jennifer [name], [id number] CSN: [id number] Age: 57 Admit Type: Outpatient Procedure:                Upper GI endoscopy Indications:              Epigastric abdominal pain, Nausea with vomiting Providers:                Jennifer Pac, Jennifer [name], Jennifer Ream. Thomasena Edis RN, Jennifer [name],                            Jennifer [name], [name] Referring Jennifer [name]:              Medicines:                Propofol per Anesthesia Complications:            No immediate complications. Estimated Blood Loss:     Estimated blood loss: none. Procedure:                Pre-Anesthesia Assessment:                           - Prior to the procedure, a History and Physical                            was performed, and patient medications and                            allergies were reviewed. The patient's tolerance of                            previous anesthesia was also reviewed. The risks                            and benefits of the procedure and the sedation                            options and risks were discussed with the patient.                            All questions were answered, and informed consent                            was obtained. Prior Anticoagulants: The patient has                            taken no anticoagulant or antiplatelet agents. ASA                            Grade Assessment: II - A patient with mild systemic                            disease. After reviewing the risks and benefits,  the patient was deemed in satisfactory condition to                            undergo the procedure.                           After obtaining informed consent, the endoscope was                            passed under direct vision. Throughout the                            procedure, the patient's blood pressure, pulse, and                             oxygen saturations were monitored continuously. The                            GIF-H190 ([id number]) scope was introduced through the                            mouth, and advanced to the second part of duodenum.                            The upper GI endoscopy was accomplished without                            difficulty. The patient tolerated the procedure                            well. Scope In: 7:36:26 AM Scope Out: 7:42:45 AM Total Procedure Duration: 0 hours 6 minutes 19 seconds  Findings:      The examined esophagus was normal. Stomach empty.      A medium-sized hiatal hernia was present (4 to 5 cm); normal-appearing       gastric mucosa patent pylorus.      The duodenal bulb and second portion of the duodenum were normal. Impression:               - Normal esophagus.                           - Medium-sized hiatal hernia.                           - Normal duodenal bulb and second portion of the                            duodenum.                           - No specimens collected. Moderate Sedation:      Moderate (conscious) sedation was personally administered by an       anesthesia professional. The following parameters were monitored: oxygen       saturation, heart rate, blood pressure, respiratory rate, EKG, adequacy  of pulmonary ventilation, and response to care. Recommendation:           - Patient has a contact number available for                            emergencies. The signs and symptoms of potential                            delayed complications were discussed with the                            patient. Return to normal activities tomorrow.                            Written discharge instructions were provided to the                            patient.                           - Advance diet as tolerated.                           - Continue present medications. Add Carafate                            suspension 1 g before  meals and at bedtime as                            needed epigastric pain/nausea                           - Return to my office in 2 months. Procedure Code(s):        --- Professional ---                           [id number], Esophagogastroduodenoscopy, flexible,                            transoral; diagnostic, including collection of                            specimen(s) by brushing or washing, when performed                            (separate procedure) Diagnosis Code(s):        --- Professional ---                           K44.9, Diaphragmatic hernia without obstruction or                            gangrene                           R10.13, Epigastric pain  R11.2, Nausea with vomiting, unspecified CPT copyright 2022 [employer]. All rights reserved. The codes documented in this report are preliminary and upon coder review may  be revised to meet current compliance requirements. Jennifer [name]. Jennifer Romig, Jennifer [name] Jennifer Pac, Jennifer [name] 05/20/2022 8:01:39 AM This report has been signed electronically. Number of Addenda: 0

## 2022-05-20 NOTE — Interval H&P Note (Signed)
History and Physical Interval Note:  05/20/2022 7:29 AM  Jennifer [name]  has presented today for surgery, with the diagnosis of epigastric pain, gerd, n/v.  The various methods of treatment have been discussed with the patient and family. After consideration of risks, benefits and other options for treatment, the patient has consented to  Procedure(s) with comments: ESOPHAGOGASTRODUODENOSCOPY (EGD) WITH PROPOFOL (N/A) - 7:30am;ASA 3 as a surgical intervention.  The patient's history has been reviewed, patient examined, no change in status, stable for surgery.  I have reviewed the patient's chart and labs.  Questions were answered to the patient's satisfaction.     Jennifer [name]  No change.  Denies dysphagia.  Continues to have intermittent violent heaving.  Diagnostic EGD today per plan. The risks, benefits, limitations, alternatives and imponderables have been reviewed with the patient. Potential for esophageal dilation, biopsy, etc. have also been reviewed.  Questions have been answered. All parties agreeable.

## 2022-05-20 NOTE — Discharge Instructions (Signed)
EGD Discharge instructions Please read the instructions outlined below and refer to this sheet in the next few weeks. These discharge instructions provide you with general information on caring for yourself after you leave the hospital. Your doctor may also give you specific instructions. While your treatment has been planned according to the most current medical practices available, unavoidable complications occasionally occur. If you have any problems or questions after discharge, please call your doctor. ACTIVITY You may resume your regular activity but move at a slower pace for the next 24 hours.  Take frequent rest periods for the next 24 hours.  Walking will help expel (get rid of) the air and reduce the bloated feeling in your abdomen.  No driving for 24 hours (because of the anesthesia (medicine) used during the test).  You may shower.  Do not sign any important legal documents or operate any machinery for 24 hours (because of the anesthesia used during the test).  NUTRITION Drink plenty of fluids.  You may resume your normal diet.  Begin with a light meal and progress to your normal diet.  Avoid alcoholic beverages for 24 hours or as instructed by your caregiver.  MEDICATIONS You may resume your normal medications unless your caregiver tells you otherwise.  WHAT YOU CAN EXPECT TODAY You may experience abdominal discomfort such as a feeling of fullness or "gas" pains.  FOLLOW-UP Your doctor will discuss the results of your test with you.  SEEK IMMEDIATE MEDICAL ATTENTION IF ANY OF THE FOLLOWING OCCUR: Excessive nausea (feeling sick to your stomach) and/or vomiting.  Severe abdominal pain and distention (swelling).  Trouble swallowing.  Temperature over 101 F (37.8 C).  Rectal bleeding or vomiting of blood.     You have a moderate size hiatal hernia at your upper GI tract otherwise appeared normal.  Continue your present medications.  We will add Carafate 1 g slurry around  mealtime and at bedtime as needed for epigastric pain and nausea -already called into your pharmacy.  Office visit with Brooke Bonito in 2 months  At patient request,, I called Kara Mead early at [phone] -rolled to voicemail.  Left a detailed message.

## 2022-05-22 NOTE — Anesthesia Postprocedure Evaluation (Signed)
Anesthesia Post Note  Patient: Jennifer [name]  Procedure(s) Performed: ESOPHAGOGASTRODUODENOSCOPY (EGD) WITH PROPOFOL  Patient location during evaluation: Phase II Anesthesia Type: General Level of consciousness: awake Pain management: pain level controlled Vital Signs Assessment: post-procedure vital signs reviewed and stable Respiratory status: spontaneous breathing and respiratory function stable Cardiovascular status: blood pressure returned to baseline and stable Postop Assessment: no headache and no apparent nausea or vomiting Anesthetic complications: no Comments: Late entry   No notable events documented.   Last Vitals:  Vitals:   05/20/22 0752 05/20/22 0755  BP: (!) 95/56   Pulse:    Resp: 16   Temp:    SpO2: 92% 93%    Last Pain:  Vitals:   05/21/22 1102  TempSrc:   PainSc: 3                  Windell Norfolk

## 2022-05-27 ENCOUNTER — Encounter (HOSPITAL_COMMUNITY): Payer: Self-pay | Admitting: Internal Medicine

## 2022-05-27 DIAGNOSIS — F502 Bulimia nervosa: Secondary | ICD-10-CM | POA: Diagnosis not present

## 2022-05-27 DIAGNOSIS — F319 Bipolar disorder, unspecified: Secondary | ICD-10-CM | POA: Diagnosis not present

## 2022-05-27 DIAGNOSIS — F4310 Post-traumatic stress disorder, unspecified: Secondary | ICD-10-CM | POA: Diagnosis not present

## 2022-05-27 DIAGNOSIS — F429 Obsessive-compulsive disorder, unspecified: Secondary | ICD-10-CM | POA: Diagnosis not present

## 2022-06-08 DIAGNOSIS — F319 Bipolar disorder, unspecified: Secondary | ICD-10-CM | POA: Diagnosis not present

## 2022-06-08 DIAGNOSIS — F502 Bulimia nervosa: Secondary | ICD-10-CM | POA: Diagnosis not present

## 2022-06-08 DIAGNOSIS — F429 Obsessive-compulsive disorder, unspecified: Secondary | ICD-10-CM | POA: Diagnosis not present

## 2022-06-08 DIAGNOSIS — F4310 Post-traumatic stress disorder, unspecified: Secondary | ICD-10-CM | POA: Diagnosis not present

## 2022-06-09 ENCOUNTER — Ambulatory Visit: Payer: Medicare PPO | Admitting: Gastroenterology

## 2022-06-10 DIAGNOSIS — F4310 Post-traumatic stress disorder, unspecified: Secondary | ICD-10-CM | POA: Diagnosis not present

## 2022-06-10 DIAGNOSIS — F319 Bipolar disorder, unspecified: Secondary | ICD-10-CM | POA: Diagnosis not present

## 2022-06-10 DIAGNOSIS — F429 Obsessive-compulsive disorder, unspecified: Secondary | ICD-10-CM | POA: Diagnosis not present

## 2022-06-10 DIAGNOSIS — F502 Bulimia nervosa: Secondary | ICD-10-CM | POA: Diagnosis not present

## 2022-06-24 DIAGNOSIS — F4310 Post-traumatic stress disorder, unspecified: Secondary | ICD-10-CM | POA: Diagnosis not present

## 2022-06-24 DIAGNOSIS — F502 Bulimia nervosa: Secondary | ICD-10-CM | POA: Diagnosis not present

## 2022-06-24 DIAGNOSIS — F319 Bipolar disorder, unspecified: Secondary | ICD-10-CM | POA: Diagnosis not present

## 2022-06-24 DIAGNOSIS — F429 Obsessive-compulsive disorder, unspecified: Secondary | ICD-10-CM | POA: Diagnosis not present

## 2022-07-01 DIAGNOSIS — F319 Bipolar disorder, unspecified: Secondary | ICD-10-CM | POA: Diagnosis not present

## 2022-07-01 DIAGNOSIS — F4310 Post-traumatic stress disorder, unspecified: Secondary | ICD-10-CM | POA: Diagnosis not present

## 2022-07-01 DIAGNOSIS — F429 Obsessive-compulsive disorder, unspecified: Secondary | ICD-10-CM | POA: Diagnosis not present

## 2022-07-01 DIAGNOSIS — F502 Bulimia nervosa: Secondary | ICD-10-CM | POA: Diagnosis not present

## 2022-07-06 DIAGNOSIS — F429 Obsessive-compulsive disorder, unspecified: Secondary | ICD-10-CM | POA: Diagnosis not present

## 2022-07-06 DIAGNOSIS — F502 Bulimia nervosa: Secondary | ICD-10-CM | POA: Diagnosis not present

## 2022-07-06 DIAGNOSIS — F4310 Post-traumatic stress disorder, unspecified: Secondary | ICD-10-CM | POA: Diagnosis not present

## 2022-07-06 DIAGNOSIS — F319 Bipolar disorder, unspecified: Secondary | ICD-10-CM | POA: Diagnosis not present

## 2022-07-08 DIAGNOSIS — F4310 Post-traumatic stress disorder, unspecified: Secondary | ICD-10-CM | POA: Diagnosis not present

## 2022-07-08 DIAGNOSIS — F429 Obsessive-compulsive disorder, unspecified: Secondary | ICD-10-CM | POA: Diagnosis not present

## 2022-07-08 DIAGNOSIS — F502 Bulimia nervosa: Secondary | ICD-10-CM | POA: Diagnosis not present

## 2022-07-08 DIAGNOSIS — F319 Bipolar disorder, unspecified: Secondary | ICD-10-CM | POA: Diagnosis not present

## 2022-07-15 DIAGNOSIS — F429 Obsessive-compulsive disorder, unspecified: Secondary | ICD-10-CM | POA: Diagnosis not present

## 2022-07-15 DIAGNOSIS — F4310 Post-traumatic stress disorder, unspecified: Secondary | ICD-10-CM | POA: Diagnosis not present

## 2022-07-15 DIAGNOSIS — F319 Bipolar disorder, unspecified: Secondary | ICD-10-CM | POA: Diagnosis not present

## 2022-07-15 DIAGNOSIS — F502 Bulimia nervosa: Secondary | ICD-10-CM | POA: Diagnosis not present

## 2022-07-22 DIAGNOSIS — F4310 Post-traumatic stress disorder, unspecified: Secondary | ICD-10-CM | POA: Diagnosis not present

## 2022-07-22 DIAGNOSIS — F502 Bulimia nervosa: Secondary | ICD-10-CM | POA: Diagnosis not present

## 2022-07-22 DIAGNOSIS — F319 Bipolar disorder, unspecified: Secondary | ICD-10-CM | POA: Diagnosis not present

## 2022-07-22 DIAGNOSIS — F429 Obsessive-compulsive disorder, unspecified: Secondary | ICD-10-CM | POA: Diagnosis not present

## 2022-07-27 DIAGNOSIS — F428 Other obsessive-compulsive disorder: Secondary | ICD-10-CM | POA: Diagnosis not present

## 2022-07-27 DIAGNOSIS — J309 Allergic rhinitis, unspecified: Secondary | ICD-10-CM | POA: Diagnosis not present

## 2022-07-27 DIAGNOSIS — I471 Supraventricular tachycardia, unspecified: Secondary | ICD-10-CM | POA: Diagnosis not present

## 2022-07-27 DIAGNOSIS — R32 Unspecified urinary incontinence: Secondary | ICD-10-CM | POA: Diagnosis not present

## 2022-07-27 DIAGNOSIS — G2581 Restless legs syndrome: Secondary | ICD-10-CM | POA: Diagnosis not present

## 2022-07-27 DIAGNOSIS — F411 Generalized anxiety disorder: Secondary | ICD-10-CM | POA: Diagnosis not present

## 2022-07-27 DIAGNOSIS — F502 Bulimia nervosa: Secondary | ICD-10-CM | POA: Diagnosis not present

## 2022-07-27 DIAGNOSIS — G4700 Insomnia, unspecified: Secondary | ICD-10-CM | POA: Diagnosis not present

## 2022-07-27 DIAGNOSIS — M62838 Other muscle spasm: Secondary | ICD-10-CM | POA: Diagnosis not present

## 2022-07-27 DIAGNOSIS — M1990 Unspecified osteoarthritis, unspecified site: Secondary | ICD-10-CM | POA: Diagnosis not present

## 2022-07-27 DIAGNOSIS — N182 Chronic kidney disease, stage 2 (mild): Secondary | ICD-10-CM | POA: Diagnosis not present

## 2022-07-27 DIAGNOSIS — K219 Gastro-esophageal reflux disease without esophagitis: Secondary | ICD-10-CM | POA: Diagnosis not present

## 2022-07-30 ENCOUNTER — Telehealth (INDEPENDENT_AMBULATORY_CARE_PROVIDER_SITE_OTHER): Payer: Medicare PPO | Admitting: Physician Assistant

## 2022-07-30 ENCOUNTER — Encounter: Payer: Self-pay | Admitting: Physician Assistant

## 2022-07-30 DIAGNOSIS — F429 Obsessive-compulsive disorder, unspecified: Secondary | ICD-10-CM | POA: Diagnosis not present

## 2022-07-30 DIAGNOSIS — F502 Bulimia nervosa: Secondary | ICD-10-CM | POA: Diagnosis not present

## 2022-07-30 DIAGNOSIS — F319 Bipolar disorder, unspecified: Secondary | ICD-10-CM | POA: Diagnosis not present

## 2022-07-30 DIAGNOSIS — F411 Generalized anxiety disorder: Secondary | ICD-10-CM

## 2022-07-30 DIAGNOSIS — F5105 Insomnia due to other mental disorder: Secondary | ICD-10-CM | POA: Diagnosis not present

## 2022-07-30 DIAGNOSIS — F99 Mental disorder, not otherwise specified: Secondary | ICD-10-CM | POA: Diagnosis not present

## 2022-07-30 DIAGNOSIS — F17200 Nicotine dependence, unspecified, uncomplicated: Secondary | ICD-10-CM

## 2022-07-30 NOTE — Progress Notes (Signed)
[facility]  Patient ID: Jennifer [name],  MRN: [id number]  PCP: Anabel Halon, MD  Date of Evaluation: 07/30/2022 time spent:20 minutes  Chief Complaint:  Chief Complaint   Depression; Insomnia; Anxiety; Follow-up    Virtual Visit via Telehealth  I connected with patient by a video enabled telemedicine application, with their informed consent, and verified patient privacy and that I am speaking with the correct person using two identifiers.  I am private, in my office and the patient is at home.  I discussed the limitations, risks, security and privacy concerns of performing an evaluation and management service by video and the availability of in person appointments. I also discussed with the patient that there may be a patient responsible charge related to this service. The patient expressed understanding and agreed to proceed.   I discussed the assessment and treatment plan with the patient. The patient was provided an opportunity to ask questions and all were answered. The patient agreed with the plan and demonstrated an understanding of the instructions.   The patient was advised to call back or seek an in-person evaluation if the symptoms worsen or if the condition fails to improve as anticipated.  I provided 20 minutes of non-face-to-face time during this encounter.  HISTORY/CURRENT STATUS: HPI for routine follow-up.  Not wanting to do anything, about 3 weeks ago she started feeling more depressed. Crying for no reason. Is better now, as of about a week ago. ADLs and personal hygiene are nl. Appetite is nl, still binging/purging, is seeing a new therapist for that Sherron Ales at [facility].  Sleep is an issue, goes to sleep sometimes ok with the Trazodone but wakes up a lot.  Denies any changes in concentration, making decisions, or remembering things.   Anxiety is controlled most of the time.  No PA. Denies suicidal or homicidal thoughts.  Patient  denies increased energy with decreased need for sleep, increased talkativeness, racing thoughts, impulsivity or risky behaviors, increased spending, increased libido, grandiosity, increased irritability or anger, paranoia, or hallucinations.  Denies dizziness, syncope, seizures, numbness, tingling, tremor, tics, unsteady gait, slurred speech, confusion.  No akathisia.  The mirtazapine has really helped that.  Has chronic joint pain. Denies unexplained weight loss, frequent infections, or sores that heal slowly.  No polyphagia, polydipsia, or polyuria. Denies visual changes or paresthesias.   Individual Medical History/ Review of Systems: Changes? :No        Past medications for mental health diagnoses include: Risperdal, Seroquel, Prozac, Zoloft,  Wellbutrin, Lamictal, Depakote caused hair loss, Xanax, Ambien, trazodone, Trileptal, Luvox, Topamax, Elavil, Pamelor, BuSpar, doxazosin, prazosin, lithium, Vraylar, Abilify, [name], Latuda >60 mg caused abd pain and nausea, propranolol caused diarrhea  Allergies: Codeine, Penicillins, Depakote [divalproex sodium], Mobic [meloxicam], Sonata [zaleplon], Topamax [topiramate], Aciphex [rabeprazole], Aimovig [erenumab-aooe], and Emgality [galcanezumab-gnlm]  Current Medications:  Current Outpatient Medications:    albuterol (VENTOLIN HFA) 108 (90 Base) MCG/ACT inhaler, Inhale 2 puffs into the lungs every 6 (six) hours as needed for wheezing or shortness of breath., Disp: 18 g, Rfl: 5   Brexpiprazole ([name]) 4 MG TABS, Take 4 mg by mouth in the morning., Disp: 30 tablet, Rfl: 11   clonazePAM (KLONOPIN) 0.5 MG tablet, Take 1 tablet (0.5 mg total) by mouth 3 (three) times daily as needed for anxiety. (Patient taking differently: Take 0.25-0.5 mg by mouth 3 (three) times daily as needed for anxiety.), Disp: 30 tablet, Rfl: 1   colestipol (COLESTID) 1 g tablet, Take 4 g  by mouth daily as needed (diarrhea)., Disp: , Rfl:    cromolyn (OPTICROM) 4 % ophthalmic  solution, Place 1 drop into both eyes in the morning., Disp: , Rfl:    dicyclomine (BENTYL) 20 MG tablet, Take 1 tablet (20 mg total) by mouth 3 (three) times daily as needed for spasms (Diarrhea)., Disp: 30 tablet, Rfl: 1   esomeprazole (NEXIUM) 20 MG capsule, Take 1 capsule (20 mg total) by mouth daily., Disp: 30 capsule, Rfl: 1   Famotidine-Ca Carb-Mag Hydrox (PEPCID COMPLETE PO), Take 1 tablet by mouth in the morning., Disp: , Rfl:    fluvoxaMINE (LUVOX) 100 MG tablet, Take 2 tablets (200 mg total) by mouth 2 (two) times daily., Disp: 360 tablet, Rfl: 3   gabapentin (NEURONTIN) 600 MG tablet, 2 p.o. every morning, 1 p.o. q. afternoon, 2 p.o. nightly., Disp: 150 tablet, Rfl: 11   hydrOXYzine (ATARAX) 25 MG tablet, Take 1-2 tablets (25-50 mg total) by mouth every 8 (eight) hours as needed. (Patient taking differently: Take 12.5 mg by mouth at bedtime as needed (sleep).), Disp: 90 tablet, Rfl: 11   loratadine (CLARITIN) 10 MG tablet, Take 10 mg by mouth in the morning and at bedtime. Morning & afternoon., Disp: , Rfl:    mirtazapine (REMERON) 15 MG tablet, Take 1 tablet (15 mg total) by mouth at bedtime. (Patient taking differently: Take 15 mg by mouth daily as needed (tremors).), Disp: 90 tablet, Rfl: 3   naproxen sodium (ALEVE) 220 MG tablet, Take 660 mg by mouth daily as needed (migraine)., Disp: , Rfl:    ondansetron (ZOFRAN-ODT) 4 MG disintegrating tablet, Take 1 tablet (4 mg total) by mouth every 8 (eight) hours as needed for nausea or vomiting., Disp: 30 tablet, Rfl: 0   pramipexole (MIRAPEX) 0.5 MG tablet, Take 1 tablet (0.5 mg total) by mouth at bedtime as needed., Disp: 90 tablet, Rfl: 3   Probiotic Product (DAILY PROBIOTIC PO), Take 2 capsules by mouth daily with lunch. Provitalize Menopause Probiotics, Disp: , Rfl:    RESTASIS 0.05 % ophthalmic emulsion, Place 1 drop into both eyes in the morning., Disp: , Rfl:    Rimegepant Sulfate (NURTEC) 75 MG TBDP, Take 75 mg by mouth daily as needed  (take for abortive therapy of migraine, no more than 1 tablet in 24 hours or 8 per month)., Disp: 8 tablet, Rfl: 11   sucralfate (CARAFATE) 1 g tablet, Take 1 g slurry around meals and at bedtime as needed for nausea and epigastric pain., Disp: 120 tablet, Rfl: 3   tiZANidine (ZANAFLEX) 4 MG capsule, Take 1 capsule (4 mg total) by mouth 3 (three) times daily as needed for muscle spasms. Do not drink alcohol or drive while taking this medication.  May cause drowsiness., Disp: 15 capsule, Rfl: 0   traZODone (DESYREL) 100 MG tablet, Take 1-2 tablets (100-200 mg total) by mouth at bedtime as needed for sleep. (Patient taking differently: Take 100 mg by mouth at bedtime as needed for sleep.), Disp: 60 tablet, Rfl: 1   valACYclovir (VALTREX) 500 MG tablet, Take 500 mg by mouth daily at 12 noon., Disp: , Rfl:  Medication Side Effects: none  Family Medical/ Social History: Changes?  No  MENTAL HEALTH EXAM:  Last menstrual period 10/07/2011.There is no height or weight on file to calculate BMI.  General Appearance: Casual and Well Groomed  Eye Contact:  Good  Speech:  Clear and Coherent and Normal Rate  Volume:  Normal  Mood:  Euthymic  Affect:  Congruent  Thought Process:  Goal Directed and Descriptions of Associations: Circumstantial  Orientation:  Full (Time, Place, and Person)  Thought Content: Logical   Suicidal Thoughts:  No  Homicidal Thoughts:  No  Memory:  WNL  Judgement:  Good  Insight:  Good  Psychomotor Activity:  Normal  Concentration:  Concentration: Good and Attention Span: Good  Recall:  Good  Fund of Knowledge: Good  Language: Good  Assets:  Communication Skills Desire for Improvement Financial Resources/Insurance Housing Resilience Transportation  ADL's:  Intact  Cognition: WNL  Prognosis:  Good   DIAGNOSES:    ICD-10-CM   1. Bipolar I disorder (HCC)  F31.9     2. Obsessive-compulsive disorder, unspecified type  F42.9     3. Generalized anxiety disorder  F41.1      4. Insomnia due to other mental disorder  F51.05    F99     5. Bulimia  F50.2     6. Smoker  F17.200       Receiving Psychotherapy: Yes  Dominic Pea, and Sherron Ales at [facility]  RECOMMENDATIONS:  PDMP was reviewed.  Last Klonopin 04/30/2022.  I provided 20 minutes of non-face-to-face time during this encounter, including time spent before and after the visit in records review, medical decision making, counseling pertinent to today's visit, and charting.   Has CPX on 08/11/2022, will have labs before that and get the results to me.  She's doing better as far as the recent episode of depression goes. I recommend no changes at present but will follow closely.  Smoking cessation discussed.  Sleep hygiene discussed.   Continue [name] 4 mg, 1 p.o. every morning. Continue Klonopin 0.5 mg, 1 p.o. twice daily prn. Continue Luvox 100 mg, 2 p.o. twice daily.   Continue gabapentin 600 mg, 2 qam.,  3 p.o. nightly.  Continue hydroxyzine 25-50 mg, 1 p.o. 3 times daily as needed Continue pramipexole 0.5 mg nightly.  Per another provider. Continue trazodone 100 mg, 1-2 nightly as needed sleep. Healthy diet and exercise discussed. Continue therapy. Return in 6 weeks.   Melony Overly, PA-C

## 2022-07-31 DIAGNOSIS — F319 Bipolar disorder, unspecified: Secondary | ICD-10-CM | POA: Diagnosis not present

## 2022-07-31 DIAGNOSIS — F4310 Post-traumatic stress disorder, unspecified: Secondary | ICD-10-CM | POA: Diagnosis not present

## 2022-07-31 DIAGNOSIS — F429 Obsessive-compulsive disorder, unspecified: Secondary | ICD-10-CM | POA: Diagnosis not present

## 2022-07-31 DIAGNOSIS — F502 Bulimia nervosa: Secondary | ICD-10-CM | POA: Diagnosis not present

## 2022-08-04 DIAGNOSIS — E559 Vitamin D deficiency, unspecified: Secondary | ICD-10-CM | POA: Diagnosis not present

## 2022-08-04 DIAGNOSIS — R7303 Prediabetes: Secondary | ICD-10-CM | POA: Diagnosis not present

## 2022-08-04 DIAGNOSIS — E782 Mixed hyperlipidemia: Secondary | ICD-10-CM | POA: Diagnosis not present

## 2022-08-04 DIAGNOSIS — F3160 Bipolar disorder, current episode mixed, unspecified: Secondary | ICD-10-CM | POA: Diagnosis not present

## 2022-08-04 DIAGNOSIS — G43809 Other migraine, not intractable, without status migrainosus: Secondary | ICD-10-CM | POA: Diagnosis not present

## 2022-08-05 DIAGNOSIS — F502 Bulimia nervosa: Secondary | ICD-10-CM | POA: Diagnosis not present

## 2022-08-05 DIAGNOSIS — F429 Obsessive-compulsive disorder, unspecified: Secondary | ICD-10-CM | POA: Diagnosis not present

## 2022-08-05 DIAGNOSIS — F319 Bipolar disorder, unspecified: Secondary | ICD-10-CM | POA: Diagnosis not present

## 2022-08-05 DIAGNOSIS — F4310 Post-traumatic stress disorder, unspecified: Secondary | ICD-10-CM | POA: Diagnosis not present

## 2022-08-05 LAB — CMP14+EGFR
ALT: 17 IU/L (ref 0–32)
AST: 21 IU/L (ref 0–40)
Albumin: 4.7 g/dL (ref 3.8–4.9)
Alkaline Phosphatase: 101 IU/L (ref 44–121)
BUN/Creatinine Ratio: 14 (ref 9–23)
BUN: 12 mg/dL (ref 6–24)
Bilirubin Total: 0.6 mg/dL (ref 0.0–1.2)
CO2: 23 mmol/L (ref 20–29)
Calcium: 9.8 mg/dL (ref 8.7–10.2)
Chloride: 102 mmol/L (ref 96–106)
Creatinine, Ser: 0.87 mg/dL (ref 0.57–1.00)
Globulin, Total: 2.1 g/dL (ref 1.5–4.5)
Glucose: 100 mg/dL — ABNORMAL HIGH (ref 70–99)
Potassium: 4.5 mmol/L (ref 3.5–5.2)
Sodium: 141 mmol/L (ref 134–144)
Total Protein: 6.8 g/dL (ref 6.0–8.5)
eGFR: 78 mL/min/{1.73_m2} (ref >59)

## 2022-08-05 LAB — CBC WITH DIFFERENTIAL/PLATELET
Basophils Absolute: 0.1 10*3/uL (ref 0.0–0.2)
Basos: 1 %
EOS (ABSOLUTE): 0.2 10*3/uL (ref 0.0–0.4)
Eos: 3 %
Hematocrit: 42.6 % (ref 34.0–46.6)
Hemoglobin: 15 g/dL (ref 11.1–15.9)
Immature Grans (Abs): 0 10*3/uL (ref 0.0–0.1)
Immature Granulocytes: 0 %
Lymphocytes Absolute: 2.3 10*3/uL (ref 0.7–3.1)
Lymphs: 37 %
MCH: 31.5 pg (ref 26.6–33.0)
MCHC: 35.2 g/dL (ref 31.5–35.7)
MCV: 90 fL (ref 79–97)
Monocytes Absolute: 0.6 10*3/uL (ref 0.1–0.9)
Monocytes: 10 %
Neutrophils Absolute: 3 10*3/uL (ref 1.4–7.0)
Neutrophils: 49 %
Platelets: 198 10*3/uL (ref 150–450)
RBC: 4.76 x10E6/uL (ref 3.77–5.28)
RDW: 12.5 % (ref 11.7–15.4)
WBC: 6.2 10*3/uL (ref 3.4–10.8)

## 2022-08-05 LAB — HEMOGLOBIN A1C
Est. average glucose Bld gHb Est-mCnc: 114 mg/dL
Hgb A1c MFr Bld: 5.6 % (ref 4.8–5.6)

## 2022-08-05 LAB — LIPID PANEL
Chol/HDL Ratio: 3 ratio (ref 0.0–4.4)
Cholesterol, Total: 167 mg/dL (ref 100–199)
HDL: 56 mg/dL (ref >39)
LDL Chol Calc (NIH): 95 mg/dL (ref 0–99)
Triglycerides: 87 mg/dL (ref 0–149)
VLDL Cholesterol Cal: 16 mg/dL (ref 5–40)

## 2022-08-05 LAB — VITAMIN D 25 HYDROXY (VIT D DEFICIENCY, FRACTURES): Vit D, 25-Hydroxy: 40.1 ng/mL (ref 30.0–100.0)

## 2022-08-05 LAB — TSH: TSH: 2.22 u[IU]/mL (ref 0.450–4.500)

## 2022-08-11 ENCOUNTER — Encounter: Payer: Self-pay | Admitting: Internal Medicine

## 2022-08-11 ENCOUNTER — Ambulatory Visit: Payer: Medicare PPO | Admitting: Internal Medicine

## 2022-08-11 VITALS — BP 120/83 | HR 76 | Ht 64.0 in | Wt 168.6 lb

## 2022-08-11 DIAGNOSIS — J432 Centrilobular emphysema: Secondary | ICD-10-CM | POA: Diagnosis not present

## 2022-08-11 DIAGNOSIS — G629 Polyneuropathy, unspecified: Secondary | ICD-10-CM

## 2022-08-11 DIAGNOSIS — Z0001 Encounter for general adult medical examination with abnormal findings: Secondary | ICD-10-CM | POA: Diagnosis not present

## 2022-08-11 DIAGNOSIS — G43809 Other migraine, not intractable, without status migrainosus: Secondary | ICD-10-CM | POA: Diagnosis not present

## 2022-08-11 DIAGNOSIS — F502 Bulimia nervosa: Secondary | ICD-10-CM

## 2022-08-11 DIAGNOSIS — F3160 Bipolar disorder, current episode mixed, unspecified: Secondary | ICD-10-CM | POA: Diagnosis not present

## 2022-08-11 DIAGNOSIS — K529 Noninfective gastroenteritis and colitis, unspecified: Secondary | ICD-10-CM | POA: Diagnosis not present
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
## 2022-08-11 NOTE — Assessment & Plan Note (Addendum)
Chronic loose BM Likely IBS-D Takes colestipol Continue probiotic Followed by GI

## 2022-08-11 NOTE — Patient Instructions (Addendum)
Please continue to take medications as prescribed.  Please continue to follow low carb diet and perform moderate exercise/walking at least 150 mins/week.  Please consider getting Tdap vaccine at local pharmacy.

## 2022-08-11 NOTE — Assessment & Plan Note (Addendum)
Physical exam as documented. Fasting blood tests reviewed and discussed with the patient in detail. Advised to get Tdap vaccine at local pharmacy.

## 2022-08-11 NOTE — Assessment & Plan Note (Signed)
Has chronic numbness of the extremities -likely due to peripheral neuropathy Has been evaluated by neurology On gabapentin 1200 mg QAM, 600 mg in the afternoon and 1200 mg at bedtime - for bipolar disorder Her symptoms are likely due to her psychiatric medications as well

## 2022-08-11 NOTE — Assessment & Plan Note (Signed)
On Nurtec Followed by neurology 

## 2022-08-11 NOTE — Assessment & Plan Note (Signed)
Mild wheezing, especially in the mornings Lung sounds benign today Has albuterol inhaler as needed for dyspnea or wheezing Needs to abstain from smoking, she agrees - quit in 2023

## 2022-08-11 NOTE — Assessment & Plan Note (Signed)
Needs to avoid purging to avoid dehydration Followed by psychiatry On [name], Luvox, gabapentin and Remeron for bipolar disorder On Klonopin and Atarax as needed

## 2022-08-11 NOTE — Assessment & Plan Note (Signed)
Followed by psychiatry On [name], Luvox, gabapentin and Remeron/Trazodone On Atarax as needed

## 2022-08-14 DIAGNOSIS — F502 Bulimia nervosa: Secondary | ICD-10-CM | POA: Diagnosis not present

## 2022-08-14 DIAGNOSIS — F429 Obsessive-compulsive disorder, unspecified: Secondary | ICD-10-CM | POA: Diagnosis not present

## 2022-08-14 DIAGNOSIS — F4310 Post-traumatic stress disorder, unspecified: Secondary | ICD-10-CM | POA: Diagnosis not present

## 2022-08-14 DIAGNOSIS — F319 Bipolar disorder, unspecified: Secondary | ICD-10-CM | POA: Diagnosis not present

## 2022-08-17 DIAGNOSIS — F4310 Post-traumatic stress disorder, unspecified: Secondary | ICD-10-CM | POA: Diagnosis not present

## 2022-08-17 DIAGNOSIS — F502 Bulimia nervosa: Secondary | ICD-10-CM | POA: Diagnosis not present

## 2022-08-17 DIAGNOSIS — F429 Obsessive-compulsive disorder, unspecified: Secondary | ICD-10-CM | POA: Diagnosis not present

## 2022-08-17 DIAGNOSIS — F319 Bipolar disorder, unspecified: Secondary | ICD-10-CM | POA: Diagnosis not present

## 2022-08-19 DIAGNOSIS — F502 Bulimia nervosa: Secondary | ICD-10-CM | POA: Diagnosis not present

## 2022-08-19 DIAGNOSIS — F429 Obsessive-compulsive disorder, unspecified: Secondary | ICD-10-CM | POA: Diagnosis not present

## 2022-08-19 DIAGNOSIS — F319 Bipolar disorder, unspecified: Secondary | ICD-10-CM | POA: Diagnosis not present

## 2022-08-19 DIAGNOSIS — F4310 Post-traumatic stress disorder, unspecified: Secondary | ICD-10-CM | POA: Diagnosis not present

## 2022-08-26 DIAGNOSIS — F502 Bulimia nervosa: Secondary | ICD-10-CM | POA: Diagnosis not present

## 2022-08-26 DIAGNOSIS — F4310 Post-traumatic stress disorder, unspecified: Secondary | ICD-10-CM | POA: Diagnosis not present

## 2022-08-26 DIAGNOSIS — F429 Obsessive-compulsive disorder, unspecified: Secondary | ICD-10-CM | POA: Diagnosis not present

## 2022-08-26 DIAGNOSIS — F319 Bipolar disorder, unspecified: Secondary | ICD-10-CM | POA: Diagnosis not present

## 2022-09-07 NOTE — Progress Notes (Unsigned)
GI Office Note    Referring Provider: Anabel Halon, MD Primary Care Physician:  Anabel Halon, MD Primary Gastroenterologist: Gerrit Friends.Rourk, MD  Date:  09/08/2022  ID:  Jennifer [name], DOB 1965-07-20, MRN [id number]   Chief Complaint   Chief Complaint  Patient presents with   Follow-up    Patient in clinic today for a follow up visit on her epigastric pain. Patient says the pain comes and goes with the purging.She is seeing a dietician and she has patient on an app that logs food intake, thoughts,feelings, behaviors, purges. Needs rx for Bentyl.   History of Present Illness  Jennifer [name] is a 57 y.o. female with a history of anxiety, GERD, bulimia, fatty liver, depression, bipolar 1 arthritis, and diarrhea presenting today with complaint of  epigastric abdominal pain.    Last colonoscopy September 2021: -2 sigmoid polyps -Advised repeat in 10 years   Clinic visit in April 2023 with complaint of diarrhea.  Having 3-4 loose stools per day, small to moderate amounts.  Also with incomplete defecation and occasional urgency.  Denies nocturnal stools.  Abdominal pain relieved with defecation.  Pain primarily periumbilical region and lower abdomen.  Diarrhea made worse with eating.  Had good relief with over-the-counter Imodium.  Negative C. difficile, Giardia.  GI pathogen panel was positive for norovirus.   PCP visit in July again noting chronic diarrhea.  Had some improvement with Bentyl fiber, and probiotic.  Also reported chronic nausea due to bulimia.  Started on cholestyramine 4 g twice daily.   Visit on 10/01/2021, virtual follow-up.  Imodium remained helpful with diarrhea however reported more abdominal pain.  Cholestyramine also somewhat helpful however difficult with twice daily dosing as she reported all contents do not seem to resolve.  Still having urgency with varying bowel habits.  Sometimes 8-9 stools per day and varying amounts.  Denied nausea, melena, BRBPR, fever, or  chills.  Reports scattered bulimic episodes.  Advised Colestid 4 g 2-3 times daily.  Advised to continue Levsin for abdominal pain.  Use Imodium prior to meals if persistent symptoms.  Advised to keep a food and symptom log.  Potential to try Xifaxan in the future if symptoms continue.   She underwent cardiac ablation in October given symptomatic SVT.  Ablation was successful and her metoprolol was discontinued.  She was advised to follow-up in 1 year.    Office visit 12/22/21. Having a bad migraine that day.  Zofran somewhat helping.  Continue to have issues with purging and continue to see psychiatry and therapy.  Mostly having dry heaves occurring in the mornings most of the week lasting 10 to 15 minutes at a time.  Improves if she lays back down.  Continues to have occasional abdominal pain with diarrhea however felt as though Colestid was working well for her.  Taking it mostly once daily, at times she takes it twice a day it may not be as easy for her to go.  Continues to admit to improvement of bloating if she does not purge.  Advised to continue dicyclomine 20 mg as needed.  Zofran refilled.  Advised to continue Colestid 4 g once to twice daily for diarrhea, also refilled.  Continue to advise famotidine daily and increase to twice daily if needed, continue follow-up with therapy for bulimia/purging, and to avoid lactose products.   Last office visit 03/10/22.  Probiotic which helped improve her diarrhea therefore she stopped her colestipol and then had return of diarrhea.  Diarrhea was causing redness inflammation on her perineum.  Nausea improved, only having dry heaves occasionally.  Has limiting Zofran is severe and she needs it.  Did report some recent purging associated with abdominal spasms.  Continuing to undergo weekly therapy and brain spotting for bulimia and bipolar disorder.  At improvement abdominal pain since starting probiotic.  Not using dicyclomine much at all.  Advised Desitin as needed  for perineal irritation.  Advised to resume colestipol 4 g once to twice daily.  Continue dicyclomine 20 mg as needed.  Continue famotidine once daily and Zofran as needed.  Avoid lactose.   Patient sent in a message at the end of February reporting stomach upset/pain.  Continues to have purging.  Tried dicyclomine 1 night and Levsin management.  Consider short course of Carafate for 2 weeks.  Advised to increase famotidine to twice daily.   Received notification the patient recently went to the ED at the beginning of March for abdominal pain and vomiting.  Reinforced the need to stop all NSAIDs.  Advised that she may need possible repeat EGD.  Nexium was sent in given intolerance to pantoprazole in the past due to severe diarrhea.   CT A/P 03/25/2022: No acute abnormality in the abdomen or pelvis, moderate hiatal hernia.  Last office visit 04/21/2022.  Patient having epigastric pain more with sitting up in the morning down.  Has some relief with Mylanta.  Tried to have a hamburger however had upset stomach and pain shortly after followed by increased nausea.  Had not been taking pantoprazole, only famotidine twice daily.  After her ED visit she has started on Nexium.  Denied any dysphagia.  Diarrhea controlled with colestipol and dicyclomine as needed.  Reportedly to recent purging episodes. Scheduled for EGD.  Advised to avoid NSAIDs, follow GERD diet.  Continue Nexium 20 mg once daily and famotidine 20 mg twice daily.  Continue colestipol 4 g once daily and dicyclomine up to 3 times daily as needed.  EGD 05/20/2022: -Normal esophagus -Medium size hiatal hernia -Normal duodenum -Advise continue current medications -Advised Carafate suspension 1 g before meals and at bedtime as needed for pain and nausea  Today: Still purging but she is following with BH and has seen a dietician  recently as well. She has an APP that she is using called RR where she tracks meals and log feelings and thoughts, etc. To  get a picture of her habits. She states that her [facility] practitioner was scared for her about her purging and nutrition. She sees them once a week and still doing brain spotting once per month.   When she purges she has epigastric pain. She is having some loose stools and some constipation. Is taking colestipol as needed (will take 4 per day and then having more regular Bms) when she was tacking 8 per day she was having constipation. Still is having some intermittent urgency. She has been taking nexium 20 mg daily otc and has carafate that she uses as needed. She states the dicyclomine seems to make her not want to sleep as much but it stops her stomach pain. Does not need to use the carafate or dicyclomine every day.   Overall she reports she is feeling better than previously.   Current Outpatient Medications  Medication Sig Dispense Refill   albuterol (VENTOLIN HFA) 108 (90 Base) MCG/ACT inhaler Inhale 2 puffs into the lungs every 6 (six) hours as needed for wheezing or shortness of breath. 18 g 5  Brexpiprazole ([name]) 4 MG TABS Take 4 mg by mouth in the morning. 30 tablet 11   clonazePAM (KLONOPIN) 0.5 MG tablet Take 1 tablet (0.5 mg total) by mouth 3 (three) times daily as needed for anxiety. (Patient taking differently: Take 0.25-0.5 mg by mouth 3 (three) times daily as needed for anxiety.) 30 tablet 1   colestipol (COLESTID) 1 g tablet Take 4 g by mouth daily as needed (diarrhea).     dicyclomine (BENTYL) 20 MG tablet Take 1 tablet (20 mg total) by mouth 3 (three) times daily as needed for spasms (Diarrhea). 30 tablet 1   esomeprazole (NEXIUM) 20 MG capsule Take 1 capsule (20 mg total) by mouth daily. 30 capsule 1   Famotidine-Ca Carb-Mag Hydrox (PEPCID COMPLETE PO) Take 1 tablet by mouth in the morning.     fluvoxaMINE (LUVOX) 100 MG tablet Take 2 tablets (200 mg total) by mouth 2 (two) times daily. 360 tablet 3   gabapentin (NEURONTIN) 600 MG tablet 2 p.o. every morning, 1 p.o. q. afternoon,  2 p.o. nightly. 150 tablet 11   hydrOXYzine (ATARAX) 25 MG tablet Take 1-2 tablets (25-50 mg total) by mouth every 8 (eight) hours as needed. (Patient taking differently: Take 12.5 mg by mouth at bedtime as needed (sleep).) 90 tablet 11   loratadine (CLARITIN) 10 MG tablet Take 10 mg by mouth in the morning and at bedtime. Morning & afternoon.     mirtazapine (REMERON) 15 MG tablet Take 1 tablet (15 mg total) by mouth at bedtime. (Patient taking differently: Take 15 mg by mouth daily as needed (tremors).) 90 tablet 3   naproxen sodium (ALEVE) 220 MG tablet Take 660 mg by mouth daily as needed (migraine).     ondansetron (ZOFRAN-ODT) 4 MG disintegrating tablet Take 1 tablet (4 mg total) by mouth every 8 (eight) hours as needed for nausea or vomiting. 30 tablet 0   pramipexole (MIRAPEX) 0.5 MG tablet Take 1 tablet (0.5 mg total) by mouth at bedtime as needed. 90 tablet 3   Probiotic Product (DAILY PROBIOTIC PO) Take 2 capsules by mouth daily with lunch.     RESTASIS 0.05 % ophthalmic emulsion Place 1 drop into both eyes 2 (two) times daily.     Rimegepant Sulfate (NURTEC) 75 MG TBDP Take 75 mg by mouth daily as needed (take for abortive therapy of migraine, no more than 1 tablet in 24 hours or 8 per month). 8 tablet 11   sucralfate (CARAFATE) 1 g tablet Take 1 g slurry around meals and at bedtime as needed for nausea and epigastric pain. 120 tablet 3   traZODone (DESYREL) 100 MG tablet Take 1-2 tablets (100-200 mg total) by mouth at bedtime as needed for sleep. (Patient taking differently: Take 100 mg by mouth at bedtime as needed for sleep.) 60 tablet 1   valACYclovir (VALTREX) 500 MG tablet Take 500 mg by mouth daily at 12 noon.     tiZANidine (ZANAFLEX) 4 MG capsule Take 1 capsule (4 mg total) by mouth 3 (three) times daily as needed for muscle spasms. Do not drink alcohol or drive while taking this medication.  May cause drowsiness. (Patient not taking: Reported on 09/08/2022) 15 capsule 0   No  current facility-administered medications for this visit.    Past Medical History:  Diagnosis Date   Abdominal pain, epigastric 10/07/2018   Acute non-recurrent maxillary sinusitis 11/07/2019   Anxiety    Arthritis    Binge-eating and purging type anorexia nervosa    Bipolar  affective disorder (HCC) 12/04/2015   Bipolar affective disorder, current episode manic without psychotic symptoms (HCC) 12/04/2015   Bipolar disorder (HCC)    Cellulitis and abscess of buttock 02/15/2013   Cystitis, interstitial    Depression    Depression    Phreesia 01/28/2020   Dysrhythmia    Emphysema lung (HCC)    Esophageal polyp    about 20 years ago   GERD (gastroesophageal reflux disease)    Hypertension    Left wrist pain 03/22/2019   OCD (obsessive compulsive disorder)    Palpitations 05/25/2014   Rectal pain 12/06/2018   Rib pain on right side 01/31/2019   Rosacea 03/27/2019   Sprain of temporomandibular joint or ligament 01/10/2019   Tobacco abuse 12/17/2021    Past Surgical History:  Procedure Laterality Date   BLADDER SURGERY     CHOLECYSTECTOMY     COLONOSCOPY WITH PROPOFOL N/A 10/05/2019   Procedure: COLONOSCOPY WITH PROPOFOL;  Surgeon: Corbin Ade, MD;  Location: [facility];  Service: Endoscopy;  Laterality: N/A;  12:45pm   COSMETIC SURGERY N/A    Phreesia 01/28/2020   ESOPHAGOGASTRODUODENOSCOPY  09/28/2016   [facility]; Dr. Marca Ancona; erosions in the esophagus, 5 cm hiatal hernia, nonbleeding erosive gastropathy s/p biopsied, normal duodenum.  Path with chronic inactive gastritis, no H. pylori or intestinal metaplasia.    ESOPHAGOGASTRODUODENOSCOPY (EGD) WITH PROPOFOL N/A 10/17/2018   Dr. Jena Gauss: mild reflux esophagitis, small hiatal hernia   ESOPHAGOGASTRODUODENOSCOPY (EGD) WITH PROPOFOL N/A 05/20/2022   Procedure: ESOPHAGOGASTRODUODENOSCOPY (EGD) WITH PROPOFOL;  Surgeon: Corbin Ade, MD;  Location: [facility];  Service: Endoscopy;  Laterality: N/A;  7:30am;ASA 3    POLYPECTOMY  10/05/2019   Procedure: POLYPECTOMY;  Surgeon: Corbin Ade, MD;  Location: [facility];  Service: Endoscopy;;   SVT ABLATION N/A 10/31/2021   Procedure: SVT ABLATION;  Surgeon: Maurice Small, MD;  Location: [facility];  Service: Cardiovascular;  Laterality: N/A;   VOCAL CORD LATERALIZATION, ENDOSCOPIC APPROACH W/ MLB     vocal cord nodule removed      Family History  Problem Relation Age of Onset   Healthy Mother    Healthy Father    Migraines Paternal Grandmother    Colon cancer Neg Hx    Colon polyps Neg Hx     Allergies as of 09/08/2022 - Review Complete 09/08/2022  Allergen Reaction Noted   Codeine Shortness Of Breath, Nausea Only, and Rash 02/15/2013   Penicillins Hives, Rash, Shortness Of Breath, and Swelling 10/09/2011   Depakote [divalproex sodium] Hives, Itching, and Rash 01/28/2020   Mobic [meloxicam] Nausea And Vomiting and Other (See Comments) 05/08/2014   Sonata [zaleplon] Other (See Comments) 10/22/2017   Topamax [topiramate] Other (See Comments) 10/04/2018   Aciphex [rabeprazole] Swelling 07/16/2020   Aimovig [erenumab-aooe] Itching 06/26/2019   Emgality [galcanezumab-gnlm] Hives and Rash 10/04/2018    Social History   Socioeconomic History   Marital status: Married    Spouse name: Todd   Number of children: 0   Years of education: Not on file   Highest education level: Not on file  Occupational History   Not on file  Tobacco Use   Smoking status: Former    Current packs/day: 0.00    Average packs/day: 1 pack/day for 33.8 years (33.8 ttl pk-yrs)    Types: Cigarettes    Start date: 01/20/1988    Quit date: 11/12/2021    Years since quitting: 0.8   Smokeless tobacco: Never  Vaping Use  Vaping status: Never Used  Substance and Sexual Activity   Alcohol use: No    Alcohol/week: 0.0 standard drinks of alcohol   Drug use: No   Sexual activity: Yes    Birth control/protection: None  Other Topics Concern   Not on file   Social History Narrative   Lives with husband -Tawanna Cooler of 15 years       Yorkie- Max      Enjoys: reading-all genres      Diet: eats all food groups   Caffeine: 1 cup daily at times, diet dr pepper daily   Water: gatorade  zero and water       Wears seat belt    Does not use phone while driving   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   Licensed conveyancer  -safe area         Social Determinants of Health   Financial Resource Strain: Low Risk  (10/16/2021)   Overall Financial Resource Strain (CARDIA)    Difficulty of Paying Living Expenses: Not hard at all  Food Insecurity: No Food Insecurity (10/16/2021)   Hunger Vital Sign    Worried About Running Out of Food in the Last Year: Never true    Ran Out of Food in the Last Year: Never true  Transportation Needs: No Transportation Needs (10/16/2021)   PRAPARE - Administrator, Civil Service (Medical): No    Lack of Transportation (Non-Medical): No  Physical Activity: Inactive (10/16/2021)   Exercise Vital Sign    Days of Exercise per Week: 0 days    Minutes of Exercise per Session: 0 min  Stress: Stress Concern Present (10/16/2021)   [employer] of Occupational Health - Occupational Stress Questionnaire    Feeling of Stress : Very much  Social Connections: Socially Integrated (10/16/2021)   Social Connection and Isolation Panel [NHANES]    Frequency of Communication with Friends and Family: More than three times a week    Frequency of Social Gatherings with Friends and Family: Once a week    Attends Religious Services: More than 4 times per year    Active Member of [employer] or Organizations: Yes    Attends Engineer, structural: More than 4 times per year    Marital Status: Married     Review of Systems   Gen: Denies fever, chills, anorexia. Denies fatigue, weakness, weight loss.  CV: Denies chest pain, palpitations, syncope, peripheral edema, and claudication. Resp: Denies dyspnea at rest, cough,  wheezing, coughing up blood, and pleurisy. GI: See HPI Derm: Denies rash, itching, dry skin Psych: Denies depression, anxiety, memory loss, confusion. No homicidal or suicidal ideation.  Heme: Denies bruising, bleeding, and enlarged lymph nodes.   Physical Exam   BP 104/71 (BP Location: Right Arm, Patient Position: Sitting, Cuff Size: Normal)   Pulse 84   Temp (!) 97.3 F (36.3 C) (Temporal)   Ht 5\' 4"  (1.626 m)   Wt 172 lb 6.4 oz (78.2 kg)   LMP 10/07/2011   BMI 29.59 kg/m   General:   Alert and oriented. No distress noted. Pleasant and cooperative.  Head:  Normocephalic and atraumatic. Eyes:  Conjuctiva clear without scleral icterus. Mouth:  Oral mucosa pink and moist. Good dentition. No lesions. Abdomen:  +BS, soft, non-tender and non-distended. No rebound or guarding. No HSM or masses noted. Rectal: deferred Msk:  Symmetrical without gross deformities. Normal posture. Extremities:  Without edema. Neurologic:  Alert and  oriented x4 Psych:  Alert and cooperative. Normal mood and affect.  Assessment  Jennifer [name] is a 57 y.o. female with a history of bulimia, GERD, anxiety, bipolar 1, depression, fatty liver, arthritis, chronic diarrhea presenting today for follow-up.  GERD, Nausea, epigastric pain: Patient has chronic history of bulimia but she is continuing to seek therapy for.  Is seeing a behavioral health therapist weekly, performing brain spotting monthly, and recently started seeing a dietitian where she is tracking all of her thoughts, purging episodes, and tracking her meals.  CT scan in March 20 ED visit revealed moderate hiatal hernia.  Recent EGD confirmed moderate size hiatal hernia with no evidence of esophagitis or duodenitis.  This is an improvement from her prior upper endoscopy in 2021 which she had mild erosive esophagitis.  She has been doing well with Nexium 20 mg once daily and Carafate as needed.  She also continues on dicyclomine for abdominal pain, as  needed basis.  Reassuringly she has not needed Carafate or dicyclomine on a regular basis.  GERD diet reinforced again today.  We again discussed that purging would likely decrease her epigastric pain as it would cut down on stomach acid production as well as reduce intra-abdominal pressure on her hiatal hernia.  Chronic diarrhea: Fairly well-controlled on colestipol 4 g once daily.  Tried twice daily in the past however resulted in constipation.  At times however even with once daily dosing she experiences some mild constipation therefore advised her to hold if no bowel movement in 24-48 hours and resume when diarrhea starts back.  Some of her constipation related to medication is likely due to the addition of dicyclomine as needed.  PLAN   Continue Nexium 20 mg once daily, refilled today. Continue Carafate as needed for breakthrough. Continue colestipol 4 g once daily. Continue dicyclomine 20 mg as needed Advise she may use famotidine as needed for severe breakthrough. GERD diet/lifestyle modifications reinforced Encouraged for her to continue to follow with behavioral health therapist and dietitian to reduce purging.  Follow-up in 6 months, sooner if needed    Brooke Bonito, MSN, FNP-BC, AGACNP-BC [facility]

## 2022-09-08 ENCOUNTER — Encounter: Payer: Self-pay | Admitting: Gastroenterology

## 2022-09-08 ENCOUNTER — Ambulatory Visit: Payer: Medicare PPO | Admitting: Gastroenterology

## 2022-09-08 DIAGNOSIS — K2100 Gastro-esophageal reflux disease with esophagitis, without bleeding: Secondary | ICD-10-CM

## 2022-09-08 DIAGNOSIS — K591 Functional diarrhea: Secondary | ICD-10-CM | POA: Diagnosis not present

## 2022-09-08 DIAGNOSIS — R1013 Epigastric pain: Secondary | ICD-10-CM | POA: Diagnosis not present

## 2022-09-08 MED ORDER — ESOMEPRAZOLE MAGNESIUM 20 MG PO CPDR
20.0000 mg | DELAYED_RELEASE_CAPSULE | Freq: Every day | ORAL | 5 refills | Status: DC
Start: 2022-09-08 — End: 2023-03-09

## 2022-09-08 MED ORDER — DICYCLOMINE HCL 20 MG PO TABS
20.0000 mg | ORAL_TABLET | Freq: Three times a day (TID) | ORAL | 1 refills | Status: DC | PRN
Start: 2022-09-08 — End: 2023-03-17

## 2022-09-08 NOTE — Patient Instructions (Addendum)
Continue colestipol 4 g daily.  Hold if no bowel movement in 24-48 hours.  Continue the dicyclomine as needed for severe abdominal pain.  Continue Carafate as needed for upper abdominal discomfort and worsening reflux.  I have sent in prescription for Nexium for you to the pharmacy.  You will continue to take 20 mg once daily, 30 minutes prior to breakfast.  If you are having severe breakthrough symptoms occasionally you can take an additional Nexium to help with relief.  You can also use famotidine as needed.  Follow a GERD diet:  Avoid fried, fatty, greasy, spicy, citrus foods. Avoid caffeine and carbonated beverages. Avoid chocolate. Try eating 4-6 small meals a day rather than 3 large meals. Do not eat within 3 hours of laying down. Prop head of bed up on wood or bricks to create a 6 inch incline.  Continue with your dietitian visits as well as seeing your therapist once weekly.  Better control of your purging will likely improve your overall symptoms.  Happy belated birthday!!  I will see you in 6 months, sooner if needed.  It was a pleasure to see you today. I want to create trusting relationships with patients. If you receive a survey regarding your visit,  I greatly appreciate you taking time to fill this out on paper or through your MyChart. I value your feedback.  Brooke Bonito, MSN, FNP-BC, AGACNP-BC [facility]

## 2022-09-09 DIAGNOSIS — F319 Bipolar disorder, unspecified: Secondary | ICD-10-CM | POA: Diagnosis not present

## 2022-09-09 DIAGNOSIS — F502 Bulimia nervosa: Secondary | ICD-10-CM | POA: Diagnosis not present

## 2022-09-09 DIAGNOSIS — F4310 Post-traumatic stress disorder, unspecified: Secondary | ICD-10-CM | POA: Diagnosis not present

## 2022-09-09 DIAGNOSIS — F429 Obsessive-compulsive disorder, unspecified: Secondary | ICD-10-CM | POA: Diagnosis not present

## 2022-09-10 ENCOUNTER — Telehealth (INDEPENDENT_AMBULATORY_CARE_PROVIDER_SITE_OTHER): Payer: Medicare PPO | Admitting: Physician Assistant

## 2022-09-10 ENCOUNTER — Encounter: Payer: Self-pay | Admitting: Physician Assistant

## 2022-09-10 DIAGNOSIS — F411 Generalized anxiety disorder: Secondary | ICD-10-CM | POA: Diagnosis not present

## 2022-09-10 DIAGNOSIS — G4700 Insomnia, unspecified: Secondary | ICD-10-CM | POA: Diagnosis not present

## 2022-09-10 DIAGNOSIS — F509 Eating disorder, unspecified: Secondary | ICD-10-CM

## 2022-09-10 DIAGNOSIS — F429 Obsessive-compulsive disorder, unspecified: Secondary | ICD-10-CM

## 2022-09-10 DIAGNOSIS — F17200 Nicotine dependence, unspecified, uncomplicated: Secondary | ICD-10-CM

## 2022-09-10 DIAGNOSIS — F319 Bipolar disorder, unspecified: Secondary | ICD-10-CM

## 2022-09-13 MED ORDER — MIRTAZAPINE 15 MG PO TABS: 15.0000 mg | ORAL_TABLET | Freq: Every day | ORAL | 3 refills | Status: DC | PRN

## 2022-09-13 MED ORDER — TRAZODONE HCL 100 MG PO TABS: 100.0000 mg | ORAL_TABLET | Freq: Every evening | ORAL | 11 refills | Status: DC | PRN

## 2022-09-13 MED ORDER — CLONAZEPAM 0.5 MG PO TABS: 0.5000 mg | ORAL_TABLET | Freq: Three times a day (TID) | ORAL | 1 refills | Status: DC | PRN

## 2022-09-13 NOTE — Progress Notes (Signed)
[facility]  Patient ID: Jennifer [name],  MRN: [id number]  PCP: Anabel Halon, MD  Date of Evaluation: 09/10/2022 time spent: 32 minutes  Chief Complaint:  Chief Complaint   Follow-up    Virtual Visit via Telehealth  I connected with patient by a video enabled telemedicine application, with their informed consent, and verified patient privacy and that I am speaking with the correct person using two identifiers.  I am private, in my office and the patient is at home.  I discussed the limitations, risks, security and privacy concerns of performing an evaluation and management service by video and the availability of in person appointments. I also discussed with the patient that there may be a patient responsible charge related to this service. The patient expressed understanding and agreed to proceed.   I discussed the assessment and treatment plan with the patient. The patient was provided an opportunity to ask questions and all were answered. The patient agreed with the plan and demonstrated an understanding of the instructions.   The patient was advised to call back or seek an in-person evaluation if the symptoms worsen or if the condition fails to improve as anticipated.  I provided 32 minutes of non-face-to-face time during this encounter.  HISTORY/CURRENT STATUS: HPI for routine follow-up.  Doing pretty good. Has been going to quilting club which she enjoys. She has to push herself sometimes though. Patient is able to enjoy things.  Energy and motivation are fair to good, depending on the day. Unable to work d/t mental health.  No extreme sadness, tearfulness, or feelings of hopelessness.  Sleeps well most of the time, she does have to take the trazodone routinely though.  She takes mirtazapine as needed for akathisia, not sleep.  She does not take the 2 together. ADLs and personal hygiene are normal.   Denies any changes in concentration, making decisions, or  remembering things.  Appetite has not changed.  Weight is stable.  She still binges and purges and is discussing it with her therapist.  Denies suicidal or homicidal thoughts.  Situational anxiety still occurs.  It depends on the situation and how bad she feels already, whether she has energy or not.  She does not have panic attacks often and does not take the Klonopin very often.  It is effective when needed.  Not having obsessive thoughts.  No compulsions reported.  Patient denies increased energy with decreased need for sleep, increased talkativeness, racing thoughts, impulsivity or risky behaviors, increased spending, increased libido, grandiosity, increased irritability or anger, paranoia, or hallucinations.  Denies dizziness, syncope, seizures, numbness, tingling, tremor, tics, unsteady gait, slurred speech, confusion.  No akathisia.  The mirtazapine has really helped that.  Has chronic joint pain. Denies unexplained weight loss, frequent infections, or sores that heal slowly.  No polyphagia, polydipsia, or polyuria. Denies visual changes or paresthesias.   Individual Medical History/ Review of Systems: Changes? :No        Past medications for mental health diagnoses include: Risperdal, Seroquel, Prozac, Zoloft,  Wellbutrin, Lamictal, Depakote caused hair loss, Xanax, Ambien, trazodone, Trileptal, Luvox, Topamax, Elavil, Pamelor, BuSpar, doxazosin, prazosin, lithium, Vraylar, Abilify, [name], Latuda >60 mg caused abd pain and nausea, propranolol caused diarrhea  Allergies: Codeine, Penicillins, Depakote [divalproex sodium], Mobic [meloxicam], Sonata [zaleplon], Topamax [topiramate], Aciphex [rabeprazole], Aimovig [erenumab-aooe], and Emgality [galcanezumab-gnlm]  Current Medications:  Current Outpatient Medications:    albuterol (VENTOLIN HFA) 108 (90 Base) MCG/ACT inhaler, Inhale 2 puffs into the lungs every 6 (six) hours  as needed for wheezing or shortness of breath., Disp: 18 g, Rfl: 5    Brexpiprazole ([name]) 4 MG TABS, Take 4 mg by mouth in the morning., Disp: 30 tablet, Rfl: 11   colestipol (COLESTID) 1 g tablet, Take 4 g by mouth daily as needed (diarrhea)., Disp: , Rfl:    dicyclomine (BENTYL) 20 MG tablet, Take 1 tablet (20 mg total) by mouth 3 (three) times daily as needed for spasms (Diarrhea)., Disp: 30 tablet, Rfl: 1   esomeprazole (NEXIUM) 20 MG capsule, Take 1 capsule (20 mg total) by mouth daily., Disp: 30 capsule, Rfl: 5   Famotidine-Ca Carb-Mag Hydrox (PEPCID COMPLETE PO), Take 1 tablet by mouth in the morning., Disp: , Rfl:    fluvoxaMINE (LUVOX) 100 MG tablet, Take 2 tablets (200 mg total) by mouth 2 (two) times daily., Disp: 360 tablet, Rfl: 3   gabapentin (NEURONTIN) 600 MG tablet, 2 p.o. every morning, 1 p.o. q. afternoon, 2 p.o. nightly., Disp: 150 tablet, Rfl: 11   hydrOXYzine (ATARAX) 25 MG tablet, Take 1-2 tablets (25-50 mg total) by mouth every 8 (eight) hours as needed. (Patient taking differently: Take 12.5 mg by mouth at bedtime as needed (sleep).), Disp: 90 tablet, Rfl: 11   loratadine (CLARITIN) 10 MG tablet, Take 10 mg by mouth in the morning and at bedtime. Morning & afternoon., Disp: , Rfl:    naproxen sodium (ALEVE) 220 MG tablet, Take 660 mg by mouth daily as needed (migraine)., Disp: , Rfl:    ondansetron (ZOFRAN-ODT) 4 MG disintegrating tablet, Take 1 tablet (4 mg total) by mouth every 8 (eight) hours as needed for nausea or vomiting., Disp: 30 tablet, Rfl: 0   pramipexole (MIRAPEX) 0.5 MG tablet, Take 1 tablet (0.5 mg total) by mouth at bedtime as needed., Disp: 90 tablet, Rfl: 3   Probiotic Product (DAILY PROBIOTIC PO), Take 2 capsules by mouth daily with lunch., Disp: , Rfl:    RESTASIS 0.05 % ophthalmic emulsion, Place 1 drop into both eyes 2 (two) times daily., Disp: , Rfl:    Rimegepant Sulfate (NURTEC) 75 MG TBDP, Take 75 mg by mouth daily as needed (take for abortive therapy of migraine, no more than 1 tablet in 24 hours or 8 per month).,  Disp: 8 tablet, Rfl: 11   sucralfate (CARAFATE) 1 g tablet, Take 1 g slurry around meals and at bedtime as needed for nausea and epigastric pain., Disp: 120 tablet, Rfl: 3   valACYclovir (VALTREX) 500 MG tablet, Take 500 mg by mouth daily at 12 noon., Disp: , Rfl:    clonazePAM (KLONOPIN) 0.5 MG tablet, Take 1 tablet (0.5 mg total) by mouth 3 (three) times daily as needed for anxiety., Disp: 30 tablet, Rfl: 1   mirtazapine (REMERON) 15 MG tablet, Take 1 tablet (15 mg total) by mouth daily as needed (tremors)., Disp: 90 tablet, Rfl: 3   traZODone (DESYREL) 100 MG tablet, Take 1-2 tablets (100-200 mg total) by mouth at bedtime as needed for sleep., Disp: 60 tablet, Rfl: 11 Medication Side Effects: none  Family Medical/ Social History: Changes?  No  MENTAL HEALTH EXAM:  Last menstrual period 10/07/2011.There is no height or weight on file to calculate BMI.  General Appearance: Casual and Well Groomed  Eye Contact:  Good  Speech:  Clear and Coherent and Normal Rate  Volume:  Normal  Mood:  Euthymic  Affect:  Congruent  Thought Process:  Goal Directed and Descriptions of Associations: Circumstantial  Orientation:  Full (Time, Place, and Person)  Thought Content: Logical   Suicidal Thoughts:  No  Homicidal Thoughts:  No  Memory:  WNL  Judgement:  Good  Insight:  Good  Psychomotor Activity:  Normal  Concentration:  Concentration: Good and Attention Span: Good  Recall:  Good  Fund of Knowledge: Good  Language: Good  Assets:  Communication Skills Desire for Improvement Financial Resources/Insurance Housing Transportation  ADL's:  Intact  Cognition: WNL  Prognosis:  Good   Labs 08/04/2022 CBC is normal CMP glucose is 100, all other values normal Hemoglobin A1c 5.6 Lipid panel total cholesterol 167, triglycerides 87, HDL 56, LDL 95 TSH 2.2 Vitamin D 40.1  DIAGNOSES:    ICD-10-CM   1. Bipolar I disorder (HCC)  F31.9     2. Obsessive-compulsive disorder, unspecified type   F42.9     3. Generalized anxiety disorder  F41.1     4. Insomnia, unspecified type  G47.00     5. Eating disorder, unspecified type  F50.9     6. Smoker  F17.200      Receiving Psychotherapy: Yes  Dominic Pea, and Sherron Ales at [facility]  RECOMMENDATIONS:  PDMP was reviewed.  Last Klonopin 09/03/2022.  Gabapentin known to me.   I provided 32 minutes of non-face-to-face time during this encounter, including time spent before and after the visit in records review, medical decision making, counseling pertinent to today's visit, and charting.   Smoking cessation discussed.  Sleep hygiene discussed.   Continue [name] 4 mg, 1 p.o. every morning. Continue Klonopin 0.5 mg, 1 p.o. twice daily prn. Continue Luvox 100 mg, 2 p.o. twice daily.   Continue gabapentin 600 mg, 2 qam.,  3 p.o. nightly.  Continue hydroxyzine 25-50 mg, 1 p.o. 3 times daily as needed. Continue mirtazapine 15 mg, 1 p.o. nightly as needed tremors/akathisia. Continue pramipexole 0.5 mg nightly.  Per another provider. Continue trazodone 100 mg, 1-2 nightly as needed sleep. Continue therapy. Return in 3 months.  Melony Overly, PA-C

## 2022-09-14 ENCOUNTER — Telehealth (INDEPENDENT_AMBULATORY_CARE_PROVIDER_SITE_OTHER): Payer: Medicare PPO | Admitting: Family Medicine

## 2022-09-14 ENCOUNTER — Encounter: Payer: Self-pay | Admitting: Family Medicine

## 2022-09-14 DIAGNOSIS — R0981 Nasal congestion: Secondary | ICD-10-CM | POA: Diagnosis not present

## 2022-09-14 DIAGNOSIS — J0100 Acute maxillary sinusitis, unspecified: Secondary | ICD-10-CM

## 2022-09-14 DIAGNOSIS — R110 Nausea: Secondary | ICD-10-CM

## 2022-09-14 MED ORDER — ONDANSETRON 4 MG PO TBDP: 4.0000 mg | ORAL_TABLET | Freq: Three times a day (TID) | ORAL | 0 refills | Status: DC | PRN

## 2022-09-14 MED ORDER — AZITHROMYCIN 250 MG PO TABS
ORAL_TABLET | ORAL | 0 refills | Status: AC
Start: 2022-09-14 — End: 2022-09-19

## 2022-09-14 MED ORDER — FLUTICASONE PROPIONATE 50 MCG/ACT NA SUSP: 2.0000 | Freq: Every day | NASAL | 6 refills | Status: DC

## 2022-09-14 NOTE — Progress Notes (Signed)
Virtual Visit via Video Note  I connected with Jennifer [name] on 09/14/22 at  2:00 PM EDT by a video enabled telemedicine application and verified that I am speaking with the correct person using two identifiers.  Patient Location: Home Provider Location: Office/Clinic  I discussed the limitations, risks, security, and privacy concerns of performing an evaluation and management service by video and the availability of in person appointments. I also discussed with the patient that there may be a patient responsible charge related to this service. The patient expressed understanding and agreed to proceed.  Subjective: PCP: Anabel Halon, MD  Chief Complaint  Patient presents with   bulima     Has been purging and lastnight she started having chest pain at 3am. Husband offered to take her to the ER, she declined. Tried to go back to sleep but couldn't. Today she has been having nausea, jaw pain, headache sore throat. Tried to drink some Dr Reino Kent but ended up throwing it back up. Doesn't feel well. Is out of zofran    HPI The patient presents today with complaints of maxillary pressure, ear pain, and nasal congestion persisting for a week. She denies experiencing fever but reports additional symptoms including sore throat, headaches, jaw pain, and nausea. The patient also notes purging 2-3 times weekly and is currently following up with her psychiatrist.  ROS: Per HPI  Current Outpatient Medications:    albuterol (VENTOLIN HFA) 108 (90 Base) MCG/ACT inhaler, Inhale 2 puffs into the lungs every 6 (six) hours as needed for wheezing or shortness of breath., Disp: 18 g, Rfl: 5   azithromycin (ZITHROMAX) 250 MG tablet, Take 2 tablets on day 1, then 1 tablet daily on days 2 through 5, Disp: 6 tablet, Rfl: 0   Brexpiprazole ([name]) 4 MG TABS, Take 4 mg by mouth in the morning., Disp: 30 tablet, Rfl: 11   clonazePAM (KLONOPIN) 0.5 MG tablet, Take 1 tablet (0.5 mg total) by mouth 3 (three)  times daily as needed for anxiety., Disp: 30 tablet, Rfl: 1   colestipol (COLESTID) 1 g tablet, Take 4 g by mouth daily as needed (diarrhea)., Disp: , Rfl:    dicyclomine (BENTYL) 20 MG tablet, Take 1 tablet (20 mg total) by mouth 3 (three) times daily as needed for spasms (Diarrhea)., Disp: 30 tablet, Rfl: 1   esomeprazole (NEXIUM) 20 MG capsule, Take 1 capsule (20 mg total) by mouth daily., Disp: 30 capsule, Rfl: 5   Famotidine-Ca Carb-Mag Hydrox (PEPCID COMPLETE PO), Take 1 tablet by mouth in the morning., Disp: , Rfl:    fluticasone (FLONASE) 50 MCG/ACT nasal spray, Place 2 sprays into both nostrils daily., Disp: 16 g, Rfl: 6   fluvoxaMINE (LUVOX) 100 MG tablet, Take 2 tablets (200 mg total) by mouth 2 (two) times daily., Disp: 360 tablet, Rfl: 3   gabapentin (NEURONTIN) 600 MG tablet, 2 p.o. every morning, 1 p.o. q. afternoon, 2 p.o. nightly., Disp: 150 tablet, Rfl: 11   hydrOXYzine (ATARAX) 25 MG tablet, Take 1-2 tablets (25-50 mg total) by mouth every 8 (eight) hours as needed. (Patient taking differently: Take 12.5 mg by mouth at bedtime as needed (sleep).), Disp: 90 tablet, Rfl: 11   loratadine (CLARITIN) 10 MG tablet, Take 10 mg by mouth in the morning and at bedtime. Morning & afternoon., Disp: , Rfl:    mirtazapine (REMERON) 15 MG tablet, Take 1 tablet (15 mg total) by mouth daily as needed (tremors)., Disp: 90 tablet, Rfl: 3   naproxen  sodium (ALEVE) 220 MG tablet, Take 660 mg by mouth daily as needed (migraine)., Disp: , Rfl:    pramipexole (MIRAPEX) 0.5 MG tablet, Take 1 tablet (0.5 mg total) by mouth at bedtime as needed., Disp: 90 tablet, Rfl: 3   Probiotic Product (DAILY PROBIOTIC PO), Take 2 capsules by mouth daily with lunch., Disp: , Rfl:    RESTASIS 0.05 % ophthalmic emulsion, Place 1 drop into both eyes 2 (two) times daily., Disp: , Rfl:    Rimegepant Sulfate (NURTEC) 75 MG TBDP, Take 75 mg by mouth daily as needed (take for abortive therapy of migraine, no more than 1 tablet in  24 hours or 8 per month)., Disp: 8 tablet, Rfl: 11   sucralfate (CARAFATE) 1 g tablet, Take 1 g slurry around meals and at bedtime as needed for nausea and epigastric pain., Disp: 120 tablet, Rfl: 3   traZODone (DESYREL) 100 MG tablet, Take 1-2 tablets (100-200 mg total) by mouth at bedtime as needed for sleep., Disp: 60 tablet, Rfl: 11   valACYclovir (VALTREX) 500 MG tablet, Take 500 mg by mouth daily at 12 noon., Disp: , Rfl:    ondansetron (ZOFRAN-ODT) 4 MG disintegrating tablet, Take 1 tablet (4 mg total) by mouth every 8 (eight) hours as needed for nausea or vomiting., Disp: 30 tablet, Rfl: 0  Observations/Objective: There were no vitals filed for this visit. Physical Exam  Assessment and Plan: Acute non-recurrent maxillary sinusitis -     Azithromycin; Take 2 tablets on day 1, then 1 tablet daily on days 2 through 5  Dispense: 6 tablet; Refill: 0  Nausea -     Ondansetron; Take 1 tablet (4 mg total) by mouth every 8 (eight) hours as needed for nausea or vomiting.  Dispense: 30 tablet; Refill: 0  Congestion of nasal sinus -     Fluticasone Propionate; Place 2 sprays into both nostrils daily.  Dispense: 16 g; Refill: 6  Take medication as prescribed. Increase fluids and allow for plenty of rest. Recommend Tylenol or ibuprofen as needed for pain, fever, or general discomfort. Warm salt water gargles 3-4 times daily to help with throat pain or discomfort. Recommend using a humidifier at bedtime during sleep to help with cough and nasal congestion. Follow-up if your symptoms do not improve    Follow Up Instructions: No follow-ups on file.   I discussed the assessment and treatment plan with the patient. The patient was provided an opportunity to ask questions, and all were answered. The patient agreed with the plan and demonstrated an understanding of the instructions.   The patient was advised to call back or seek an in-person evaluation if the symptoms worsen or if the condition  fails to improve as anticipated.  The above assessment and management plan was discussed with the patient. The patient verbalized understanding of and has agreed to the management plan.   Gilmore Laroche, FNP

## 2022-09-15 ENCOUNTER — Other Ambulatory Visit: Payer: Self-pay | Admitting: Family Medicine

## 2022-09-15 DIAGNOSIS — U071 COVID-19: Secondary | ICD-10-CM

## 2022-09-15 MED ORDER — NIRMATRELVIR/RITONAVIR (PAXLOVID)TABLET
3.0000 | ORAL_TABLET | Freq: Two times a day (BID) | ORAL | 0 refills | Status: AC
Start: 2022-09-15 — End: 2022-09-20

## 2022-09-15 NOTE — Telephone Encounter (Signed)
Patient  calling to follow up on MyChart message says she has Covid and is requesting Paxlovid. Please advise, thank you.

## 2022-09-15 NOTE — Progress Notes (Signed)
The patient reports a positive COVID-19 test result at home. She is advised to discontinue Azithromycin and start taking Paxlovid, and to follow up if symptoms worsen. The patient  was encouraged to stop taking Trazodone and Mirtazapine while on Paxlovid. Supportive care recommendations include increasing fluid intake and ensuring ample rest. Tylenol  may be used as needed for pain, fever, or general discomfort. Warm salt water gargles 3-4 times daily are recommended to alleviate throat pain. Using a humidifier at bedtime can help with cough and nasal congestion.

## 2022-09-23 DIAGNOSIS — F429 Obsessive-compulsive disorder, unspecified: Secondary | ICD-10-CM | POA: Diagnosis not present

## 2022-09-23 DIAGNOSIS — F502 Bulimia nervosa: Secondary | ICD-10-CM | POA: Diagnosis not present

## 2022-09-23 DIAGNOSIS — F4310 Post-traumatic stress disorder, unspecified: Secondary | ICD-10-CM | POA: Diagnosis not present

## 2022-09-23 DIAGNOSIS — F319 Bipolar disorder, unspecified: Secondary | ICD-10-CM | POA: Diagnosis not present

## 2022-09-28 DIAGNOSIS — F4310 Post-traumatic stress disorder, unspecified: Secondary | ICD-10-CM | POA: Diagnosis not present

## 2022-09-28 DIAGNOSIS — F502 Bulimia nervosa: Secondary | ICD-10-CM | POA: Diagnosis not present

## 2022-09-28 DIAGNOSIS — F319 Bipolar disorder, unspecified: Secondary | ICD-10-CM | POA: Diagnosis not present

## 2022-09-28 DIAGNOSIS — F429 Obsessive-compulsive disorder, unspecified: Secondary | ICD-10-CM | POA: Diagnosis not present

## 2022-09-30 DIAGNOSIS — F4310 Post-traumatic stress disorder, unspecified: Secondary | ICD-10-CM | POA: Diagnosis not present

## 2022-09-30 DIAGNOSIS — F429 Obsessive-compulsive disorder, unspecified: Secondary | ICD-10-CM | POA: Diagnosis not present

## 2022-09-30 DIAGNOSIS — F502 Bulimia nervosa: Secondary | ICD-10-CM | POA: Diagnosis not present

## 2022-09-30 DIAGNOSIS — F319 Bipolar disorder, unspecified: Secondary | ICD-10-CM | POA: Diagnosis not present

## 2022-10-01 ENCOUNTER — Telehealth: Payer: Self-pay | Admitting: Internal Medicine

## 2022-10-01 NOTE — Telephone Encounter (Signed)
Patient called in regard to sinuses   States that she went to Dentist for tooth sensitivity and they states that it looked to be coming from Sinus cavity, was told to reach out to pcp in regard.  Patient is requesting something to help with sinus drainage  If antibiotic she will need yeast infection coverage as well.  Pt wants a call back in regard.

## 2022-10-01 NOTE — Telephone Encounter (Signed)
Wants call back after 2:00pm has tele visit with dietician

## 2022-10-02 ENCOUNTER — Telehealth (INDEPENDENT_AMBULATORY_CARE_PROVIDER_SITE_OTHER): Payer: Medicare PPO | Admitting: Internal Medicine

## 2022-10-02 ENCOUNTER — Encounter: Payer: Self-pay | Admitting: Internal Medicine

## 2022-10-02 DIAGNOSIS — J0100 Acute maxillary sinusitis, unspecified: Secondary | ICD-10-CM

## 2022-10-02 DIAGNOSIS — N760 Acute vaginitis: Secondary | ICD-10-CM

## 2022-10-02 MED ORDER — MOXIFLOXACIN HCL 400 MG PO TABS
400.0000 mg | ORAL_TABLET | Freq: Every day | ORAL | 0 refills | Status: AC
Start: 2022-10-02 — End: 2022-10-09

## 2022-10-02 MED ORDER — FLUCONAZOLE 150 MG PO TABS
150.0000 mg | ORAL_TABLET | ORAL | 0 refills | Status: AC
Start: 2022-10-02 — End: ?

## 2022-10-02 NOTE — Telephone Encounter (Signed)
Called patient she is not up ready and dress, will keep Tuesday 9/17 appt

## 2022-10-02 NOTE — Patient Instructions (Signed)
Please start taking Moxifloxacin as prescribed.  Please use Flonase for allergies and nasal congestion.  Please use humidifier at nighttime and use vaporizer for nasal congestion.

## 2022-10-02 NOTE — Progress Notes (Signed)
Virtual Visit via Video Note   Because of Jennifer [name]'s co-morbid illnesses, she is at least at moderate risk for complications without adequate follow up.  This format is felt to be most appropriate for this patient at this time.  All issues noted in this document were discussed and addressed.  A limited physical exam was performed with this format.      Evaluation Performed:  Follow-up visit  Date:  10/02/2022   ID:  Na, Gatto July 16, 1965, MRN [id number]  Patient Location: Home Provider Location: Office/Clinic  Participants: Patient Location of Patient: Home Location of Provider: Telehealth Consent was obtain for visit to be over via telehealth. I verified that I am speaking with the correct person using two identifiers.  PCP:  Anabel Halon, MD   Chief Complaint: Nasal congestion and facial pain  History of Present Illness:    Jennifer [name] is a 57 y.o. female who has a video visit for complaint of nasal congestion and left-sided facial pain for the last 3 weeks.  She was treated for COVID infection with Paxlovid about 2 weeks ago.  She was having upper teeth pain and went to Dentist.  She was told of sinus pressure related facial pain.  She denies any fever, chills, dyspnea or wheezing currently.  She has Flonase for nasal congestion currently.  The patient does not have symptoms concerning for COVID-19 infection (fever, chills, cough, or new shortness of breath).   Past Medical, Surgical, Social History, Allergies, and Medications have been Reviewed.  Past Medical History:  Diagnosis Date   Abdominal pain, epigastric 10/07/2018   Acute non-recurrent maxillary sinusitis 11/07/2019   Anxiety    Arthritis    Binge-eating and purging type anorexia nervosa    Bipolar affective disorder (HCC) 12/04/2015   Bipolar affective disorder, current episode manic without psychotic symptoms (HCC) 12/04/2015   Bipolar disorder (HCC)    Cellulitis and abscess of  buttock 02/15/2013   Cystitis, interstitial    Depression    Depression    Phreesia 01/28/2020   Dysrhythmia    Emphysema lung (HCC)    Esophageal polyp    about 20 years ago   GERD (gastroesophageal reflux disease)    Hypertension    Left wrist pain 03/22/2019   OCD (obsessive compulsive disorder)    Palpitations 05/25/2014   Rectal pain 12/06/2018   Rib pain on right side 01/31/2019   Rosacea 03/27/2019   Sprain of temporomandibular joint or ligament 01/10/2019   Tobacco abuse 12/17/2021   Past Surgical History:  Procedure Laterality Date   BLADDER SURGERY     CHOLECYSTECTOMY     COLONOSCOPY WITH PROPOFOL N/A 10/05/2019   Procedure: COLONOSCOPY WITH PROPOFOL;  Surgeon: Corbin Ade, MD;  Location: [facility];  Service: Endoscopy;  Laterality: N/A;  12:45pm   COSMETIC SURGERY N/A    Phreesia 01/28/2020   ESOPHAGOGASTRODUODENOSCOPY  09/28/2016   [facility]; Dr. Marca Ancona; erosions in the esophagus, 5 cm hiatal hernia, nonbleeding erosive gastropathy s/p biopsied, normal duodenum.  Path with chronic inactive gastritis, no H. pylori or intestinal metaplasia.    ESOPHAGOGASTRODUODENOSCOPY (EGD) WITH PROPOFOL N/A 10/17/2018   Dr. Jena Gauss: mild reflux esophagitis, small hiatal hernia   ESOPHAGOGASTRODUODENOSCOPY (EGD) WITH PROPOFOL N/A 05/20/2022   Procedure: ESOPHAGOGASTRODUODENOSCOPY (EGD) WITH PROPOFOL;  Surgeon: Corbin Ade, MD;  Location: [facility];  Service: Endoscopy;  Laterality: N/A;  7:30am;ASA 3   POLYPECTOMY  10/05/2019   Procedure: POLYPECTOMY;  Surgeon: Corbin Ade, MD;  Location: [facility];  Service: Endoscopy;;   SVT ABLATION N/A 10/31/2021   Procedure: SVT ABLATION;  Surgeon: Maurice Small, MD;  Location: [facility];  Service: Cardiovascular;  Laterality: N/A;   VOCAL CORD LATERALIZATION, ENDOSCOPIC APPROACH W/ MLB     vocal cord nodule removed       Current Meds  Medication Sig   albuterol (VENTOLIN HFA) 108 (90 Base) MCG/ACT inhaler  Inhale 2 puffs into the lungs every 6 (six) hours as needed for wheezing or shortness of breath.   Brexpiprazole ([name]) 4 MG TABS Take 4 mg by mouth in the morning.   clonazePAM (KLONOPIN) 0.5 MG tablet Take 1 tablet (0.5 mg total) by mouth 3 (three) times daily as needed for anxiety.   colestipol (COLESTID) 1 g tablet Take 4 g by mouth daily as needed (diarrhea).   dicyclomine (BENTYL) 20 MG tablet Take 1 tablet (20 mg total) by mouth 3 (three) times daily as needed for spasms (Diarrhea).   esomeprazole (NEXIUM) 20 MG capsule Take 1 capsule (20 mg total) by mouth daily.   Famotidine-Ca Carb-Mag Hydrox (PEPCID COMPLETE PO) Take 1 tablet by mouth in the morning.   fluticasone (FLONASE) 50 MCG/ACT nasal spray Place 2 sprays into both nostrils daily.   fluvoxaMINE (LUVOX) 100 MG tablet Take 2 tablets (200 mg total) by mouth 2 (two) times daily.   gabapentin (NEURONTIN) 600 MG tablet 2 p.o. every morning, 1 p.o. q. afternoon, 2 p.o. nightly.   hydrOXYzine (ATARAX) 25 MG tablet Take 1-2 tablets (25-50 mg total) by mouth every 8 (eight) hours as needed. (Patient taking differently: Take 12.5 mg by mouth at bedtime as needed (sleep).)   loratadine (CLARITIN) 10 MG tablet Take 10 mg by mouth in the morning and at bedtime. Morning & afternoon.   mirtazapine (REMERON) 15 MG tablet Take 1 tablet (15 mg total) by mouth daily as needed (tremors).   naproxen sodium (ALEVE) 220 MG tablet Take 660 mg by mouth daily as needed (migraine).   ondansetron (ZOFRAN-ODT) 4 MG disintegrating tablet Take 1 tablet (4 mg total) by mouth every 8 (eight) hours as needed for nausea or vomiting.   pramipexole (MIRAPEX) 0.5 MG tablet Take 1 tablet (0.5 mg total) by mouth at bedtime as needed.   Probiotic Product (DAILY PROBIOTIC PO) Take 2 capsules by mouth daily with lunch.   RESTASIS 0.05 % ophthalmic emulsion Place 1 drop into both eyes 2 (two) times daily.   Rimegepant Sulfate (NURTEC) 75 MG TBDP Take 75 mg by mouth daily  as needed (take for abortive therapy of migraine, no more than 1 tablet in 24 hours or 8 per month).   sucralfate (CARAFATE) 1 g tablet Take 1 g slurry around meals and at bedtime as needed for nausea and epigastric pain.   traZODone (DESYREL) 100 MG tablet Take 1-2 tablets (100-200 mg total) by mouth at bedtime as needed for sleep.   valACYclovir (VALTREX) 500 MG tablet Take 500 mg by mouth daily at 12 noon.     Allergies:   Codeine, Penicillins, Depakote [divalproex sodium], Mobic [meloxicam], Sonata [zaleplon], Topamax [topiramate], Aciphex [rabeprazole], Aimovig [erenumab-aooe], and Emgality [galcanezumab-gnlm]   ROS:   Please see the history of present illness.     All other systems reviewed and are negative.   Labs/Other Tests and Data Reviewed:    Recent Labs: 08/04/2022: ALT 17; BUN 12; Creatinine, Ser 0.87; Hemoglobin 15.0; Platelets 198; Potassium 4.5; Sodium 141;  TSH 2.220   Recent Lipid Panel Lab Results  Component Value Date/Time   CHOL 167 08/04/2022 08:05 AM   TRIG 87 08/04/2022 08:05 AM   HDL 56 08/04/2022 08:05 AM   CHOLHDL 3.0 08/04/2022 08:05 AM   CHOLHDL 2.8 12/06/2015 06:09 AM   LDLCALC 95 08/04/2022 08:05 AM    Wt Readings from Last 3 Encounters:  09/08/22 172 lb 6.4 oz (78.2 kg)  08/11/22 168 lb 9.6 oz (76.5 kg)  05/18/22 170 lb (77.1 kg)     Objective:    Vital Signs:  LMP 10/07/2011    VITAL SIGNS:  reviewed GEN:  no acute distress EYES:  sclerae anicteric, EOMI - Extraocular Movements Intact RESPIRATORY:  normal respiratory effort, symmetric expansion NEURO:  alert and oriented x 3, no obvious focal deficit PSYCH:  normal affect  ASSESSMENT & PLAN:    Acute non-recurrent maxillary sinusitis Has left sided maxillary area pain, likely referring to upper teeth Started moxifloxacin - she recently took partial course of azithromycin and is allergic to penicillin Continue Flonase for allergies Advised to use humidifier and/or vaporizer    I  discussed the assessment and treatment plan with the patient. The patient was provided an opportunity to ask questions, and all were answered. The patient agreed with the plan and demonstrated an understanding of the instructions.   The patient was advised to call back or seek an in-person evaluation if the symptoms worsen or if the condition fails to improve as anticipated.  The above assessment and management plan was discussed with the patient. The patient verbalized understanding of and has agreed to the management plan.   Medication Adjustments/Labs and Tests Ordered: Current medicines are reviewed at length with the patient today.  Concerns regarding medicines are outlined above.   Tests Ordered: No orders of the defined types were placed in this encounter.   Medication Changes: No orders of the defined types were placed in this encounter.    Note: This dictation was prepared with Dragon dictation along with smaller phrase technology. Similar sounding words can be transcribed inadequately or may not be corrected upon review. Any transcriptional errors that result from this process are unintentional.      Disposition:  Follow up  Signed, Anabel Halon, MD  10/02/2022 11:32 AM     [facility]

## 2022-10-02 NOTE — Assessment & Plan Note (Signed)
Has left sided maxillary area pain, likely referring to upper teeth Started moxifloxacin - she recently took partial course of azithromycin and is allergic to penicillin Continue Flonase for allergies Advised to use humidifier and/or vaporizer

## 2022-10-05 ENCOUNTER — Encounter: Payer: Self-pay | Admitting: Internal Medicine

## 2022-10-06 ENCOUNTER — Other Ambulatory Visit: Payer: Self-pay | Admitting: Internal Medicine

## 2022-10-06 ENCOUNTER — Ambulatory Visit: Payer: Medicare PPO | Admitting: Internal Medicine

## 2022-10-06 ENCOUNTER — Telehealth: Payer: Self-pay | Admitting: Internal Medicine

## 2022-10-06 DIAGNOSIS — F502 Bulimia nervosa: Secondary | ICD-10-CM

## 2022-10-06 NOTE — Telephone Encounter (Signed)
Patient

## 2022-10-14 DIAGNOSIS — F4310 Post-traumatic stress disorder, unspecified: Secondary | ICD-10-CM | POA: Diagnosis not present

## 2022-10-14 DIAGNOSIS — F319 Bipolar disorder, unspecified: Secondary | ICD-10-CM | POA: Diagnosis not present

## 2022-10-14 DIAGNOSIS — F429 Obsessive-compulsive disorder, unspecified: Secondary | ICD-10-CM | POA: Diagnosis not present

## 2022-10-14 DIAGNOSIS — F502 Bulimia nervosa: Secondary | ICD-10-CM | POA: Diagnosis not present

## 2022-10-19 ENCOUNTER — Other Ambulatory Visit: Payer: Self-pay

## 2022-10-19 ENCOUNTER — Other Ambulatory Visit: Payer: Self-pay | Admitting: Internal Medicine

## 2022-10-19 DIAGNOSIS — J309 Allergic rhinitis, unspecified: Secondary | ICD-10-CM

## 2022-10-19 DIAGNOSIS — N760 Acute vaginitis: Secondary | ICD-10-CM

## 2022-10-19 MED ORDER — FLUCONAZOLE 150 MG PO TABS
150.0000 mg | ORAL_TABLET | ORAL | 0 refills | Status: DC
Start: 2022-10-19 — End: 2022-11-12

## 2022-10-19 NOTE — Telephone Encounter (Signed)
Dr Allena Katz has tried to reach patient

## 2022-10-19 NOTE — Telephone Encounter (Signed)
Pt called in regard to previous tele messages about sinus  Pt states that she is still not getting better.  Wants to see if provider will send in stronger antibiotic . Wants a call back

## 2022-10-19 NOTE — Telephone Encounter (Signed)
Pt calling again requesting something for yeast infection- says antibiotics give her a bad yeast infection. Please advise Thank you

## 2022-10-21 ENCOUNTER — Encounter: Payer: Self-pay | Admitting: Internal Medicine

## 2022-10-21 DIAGNOSIS — F429 Obsessive-compulsive disorder, unspecified: Secondary | ICD-10-CM | POA: Diagnosis not present

## 2022-10-21 DIAGNOSIS — F319 Bipolar disorder, unspecified: Secondary | ICD-10-CM | POA: Diagnosis not present

## 2022-10-21 DIAGNOSIS — F4310 Post-traumatic stress disorder, unspecified: Secondary | ICD-10-CM | POA: Diagnosis not present

## 2022-10-22 ENCOUNTER — Telehealth: Payer: Self-pay | Admitting: Internal Medicine

## 2022-10-22 NOTE — Telephone Encounter (Signed)
Lauren called from [facility] disorder clinic [phone]  about the referral received. Intake must be completed on their website [employer] can be found on the  initial page.  The intake form must be completed.

## 2022-10-23 NOTE — Telephone Encounter (Signed)
Called patient she asked for website [employer]

## 2022-10-26 ENCOUNTER — Other Ambulatory Visit: Payer: Self-pay | Admitting: Internal Medicine

## 2022-10-26 DIAGNOSIS — J0101 Acute recurrent maxillary sinusitis: Secondary | ICD-10-CM

## 2022-10-26 MED ORDER — AZITHROMYCIN 250 MG PO TABS
ORAL_TABLET | ORAL | 0 refills | Status: AC
Start: 2022-10-26 — End: 2022-10-31

## 2022-10-28 DIAGNOSIS — F502 Bulimia nervosa: Secondary | ICD-10-CM | POA: Diagnosis not present

## 2022-10-28 DIAGNOSIS — F4310 Post-traumatic stress disorder, unspecified: Secondary | ICD-10-CM | POA: Diagnosis not present

## 2022-10-28 DIAGNOSIS — F429 Obsessive-compulsive disorder, unspecified: Secondary | ICD-10-CM | POA: Diagnosis not present

## 2022-10-28 DIAGNOSIS — F319 Bipolar disorder, unspecified: Secondary | ICD-10-CM | POA: Diagnosis not present

## 2022-10-30 ENCOUNTER — Ambulatory Visit: Payer: Medicare PPO | Admitting: Podiatry

## 2022-10-30 ENCOUNTER — Encounter: Payer: Self-pay | Admitting: Family Medicine

## 2022-10-30 DIAGNOSIS — Z01818 Encounter for other preprocedural examination: Secondary | ICD-10-CM | POA: Diagnosis not present

## 2022-10-30 DIAGNOSIS — L600 Ingrowing nail: Secondary | ICD-10-CM

## 2022-10-30 MED ORDER — CICLOPIROX 8 % EX SOLN: Freq: Every day | CUTANEOUS | 0 refills | Status: DC

## 2022-10-30 NOTE — Progress Notes (Signed)
Subjective:  Patient ID: Jennifer [name], female    DOB: [birth year],  MRN: [id number]  Chief Complaint  Patient presents with   Ingrown Toenail    BILAT INGROWN LEFT WORSE THAN RIGHT WANTS IT DONE OUT PATIENT NOT IN OFFICE    57 y.o. female presents with the above complaint.  Patient presents with bilateral hallux medial border ingrown painful to touch.  She states it started growing back is causing her more discomfort she would like to have it removed in a surgical center   Review of Systems: Negative except as noted in the HPI. Denies N/V/F/Ch.  Past Medical History:  Diagnosis Date   Abdominal pain, epigastric 10/07/2018   Acute non-recurrent maxillary sinusitis 11/07/2019   Anxiety    Arthritis    Binge-eating and purging type anorexia nervosa    Bipolar affective disorder (HCC) 12/04/2015   Bipolar affective disorder, current episode manic without psychotic symptoms (HCC) 12/04/2015   Bipolar disorder (HCC)    Cellulitis and abscess of buttock 02/15/2013   Cystitis, interstitial    Depression    Depression    Phreesia 01/28/2020   Dysrhythmia    Eczema    Emphysema lung (HCC)    Esophageal polyp    about 20 years ago   GERD (gastroesophageal reflux disease)    Hypertension    Left wrist pain 03/22/2019   OCD (obsessive compulsive disorder)    Palpitations 05/25/2014   Rectal pain 12/06/2018   Recurrent upper respiratory infection (URI)    Rib pain on right side 01/31/2019   Rosacea 03/27/2019   Sprain of temporomandibular joint or ligament 01/10/2019   Tobacco abuse 12/17/2021    Current Outpatient Medications:    albuterol (VENTOLIN HFA) 108 (90 Base) MCG/ACT inhaler, Inhale 2 puffs into the lungs every 6 (six) hours as needed for wheezing or shortness of breath., Disp: 18 g, Rfl: 5   Brexpiprazole ([name]) 4 MG TABS, Take 4 mg by mouth in the morning., Disp: 30 tablet, Rfl: 11   clonazePAM (KLONOPIN) 0.5 MG tablet, Take 1 tablet (0.5 mg total) by mouth 3  (three) times daily as needed for anxiety., Disp: 30 tablet, Rfl: 1   dicyclomine (BENTYL) 20 MG tablet, Take 1 tablet (20 mg total) by mouth 3 (three) times daily as needed for spasms (Diarrhea)., Disp: 30 tablet, Rfl: 1   esomeprazole (NEXIUM) 20 MG capsule, Take 1 capsule (20 mg total) by mouth daily., Disp: 30 capsule, Rfl: 5   Famotidine-Ca Carb-Mag Hydrox (PEPCID COMPLETE PO), Take 1 tablet by mouth in the morning., Disp: , Rfl:    fluticasone (FLONASE) 50 MCG/ACT nasal spray, Place 2 sprays into both nostrils daily., Disp: 16 g, Rfl: 6   fluvoxaMINE (LUVOX) 100 MG tablet, Take 2 tablets (200 mg total) by mouth 2 (two) times daily., Disp: 360 tablet, Rfl: 3   gabapentin (NEURONTIN) 600 MG tablet, 2 p.o. every morning, 1 p.o. q. afternoon, 2 p.o. nightly., Disp: 150 tablet, Rfl: 11   hydrOXYzine (ATARAX) 25 MG tablet, Take 1-2 tablets (25-50 mg total) by mouth every 8 (eight) hours as needed. (Patient taking differently: Take 12.5 mg by mouth at bedtime as needed (sleep).), Disp: 90 tablet, Rfl: 11   loratadine (CLARITIN) 10 MG tablet, Take 10 mg by mouth in the morning and at bedtime. Morning & afternoon., Disp: , Rfl:    mirtazapine (REMERON) 15 MG tablet, Take 1 tablet (15 mg total) by mouth daily as needed (tremors)., Disp: 90 tablet, Rfl: 3  naproxen sodium (ALEVE) 220 MG tablet, Take 660 mg by mouth daily as needed (migraine)., Disp: , Rfl:    ondansetron (ZOFRAN-ODT) 4 MG disintegrating tablet, Take 1 tablet (4 mg total) by mouth every 8 (eight) hours as needed for nausea or vomiting., Disp: 30 tablet, Rfl: 0   Probiotic Product (DAILY PROBIOTIC PO), Take 2 capsules by mouth daily with lunch., Disp: , Rfl:    RESTASIS 0.05 % ophthalmic emulsion, Place 1 drop into both eyes 2 (two) times daily., Disp: , Rfl:    sucralfate (CARAFATE) 1 g tablet, Take 1 g slurry around meals and at bedtime as needed for nausea and epigastric pain., Disp: 120 tablet, Rfl: 3   traZODone (DESYREL) 100 MG  tablet, Take 1-2 tablets (100-200 mg total) by mouth at bedtime as needed for sleep., Disp: 60 tablet, Rfl: 11   Ubrogepant (UBRELVY) 100 MG TABS, Take 100 mg by mouth with or without food. If needed, a second dose may be taken at least 2 hours after the initial dose. The maximum dose in a 24-hour period is 200 mg. (Patient not taking: Reported on 11/12/2022), Disp: 10 tablet, Rfl: 11   valACYclovir (VALTREX) 500 MG tablet, Take 500 mg by mouth daily at 12 noon., Disp: , Rfl:   Social History   Tobacco Use  Smoking Status Former   Current packs/day: 0.00   Average packs/day: 1 pack/day for 33.8 years (33.8 ttl pk-yrs)   Types: Cigarettes   Start date: 01/20/1988   Quit date: 11/12/2021   Years since quitting: 1.0  Smokeless Tobacco Never    Allergies  Allergen Reactions   Codeine Shortness Of Breath, Nausea Only and Rash   Penicillins Hives, Rash, Shortness Of Breath and Swelling    Has patient had a PCN reaction causing immediate rash, facial/tongue/throat swelling, SOB or lightheadedness with hypotension: yes  Has patient had a PCN reaction causing severe rash involving mucus membranes or skin necrosis: no  Has patient had a PCN reaction that required hospitalization: no  Has patient had a PCN reaction occurring within the last 10 years: no  If all of the above answers are "NO", then may proceed with Cephalosporin use.;  Has patient had a PCN reaction causing immediate rash, facial/tongue/throat swelling, SOB or lightheadedness with hypotension: yes, Has patient had a PCN reaction causing severe rash involving mucus membranes or skin necrosis: no, Has patient had a PCN reaction that required hospitalization: no, Has patient had a PCN reaction occurring within the last 10 years: no, If all of the above answers are "NO", then may proceed with Cephalosporin use.;   Depakote [Divalproex Sodium] Hives, Itching and Rash   Mobic [Meloxicam] Nausea And Vomiting and Other (See Comments)     Possible chest tightness - instructed by MD not to take   Sonata [Zaleplon] Other (See Comments)    Hallucinations   Topamax [Topiramate] Other (See Comments)    Low BP and dizziness   Aciphex [Rabeprazole] Swelling   Aimovig [Erenumab-Aooe] Itching   Emgality [Galcanezumab-Gnlm] Hives and Rash   Objective:  There were no vitals filed for this visit. There is no height or weight on file to calculate BMI. Constitutional Well developed. Well nourished.  Vascular Dorsalis pedis pulses palpable bilaterally. Posterior tibial pulses palpable bilaterally. Capillary refill normal to all digits.  No cyanosis or clubbing noted. Pedal hair growth normal.  Neurologic Normal speech. Oriented to person, place, and time. Epicritic sensation to light touch grossly present bilaterally.  Dermatologic Painful ingrowing nail  at medial nail borders of the hallux nail bilaterally. No other open wounds. No skin lesions.  Orthopedic: Normal joint ROM without pain or crepitus bilaterally. No visible deformities. No bony tenderness.   Radiographs: None Assessment:   1. Ingrown left big toenail   2. Ingrown toenail of right foot   3. Encounter for preoperative examination for general surgical procedure     Plan:  Patient was evaluated and treated and all questions answered.  Ingrown Nail, bilaterally medial border -Clinically patient has continued to fail multiple phenol application of the bilateral hallux ingrown.  At this time patient would like to do both medial borders with Winograd procedure given that she has failed multiple phenol matricectomy.  At this time I discussed my preoperative intra postop plan with the patient in extensive detail she states understanding like to proceed with surgery. -Informed surgical risk consent was reviewed and read aloud to the patient.  I reviewed the films.  I have discussed my findings with the patient in great detail.  I have discussed all risks including but  not limited to infection, stiffness, scarring, limp, disability, deformity, damage to blood vessels and nerves, numbness, poor healing, need for braces, arthritis, chronic pain, amputation, death.  All benefits and realistic expectations discussed in great detail.  I have made no promises as to the outcome.  I have provided realistic expectations.  I have offered the patient a 2nd opinion, which they have declined and assured me they preferred to proceed despite the risks   No follow-ups on file.

## 2022-11-02 ENCOUNTER — Other Ambulatory Visit: Payer: Self-pay

## 2022-11-02 ENCOUNTER — Telehealth: Payer: Self-pay | Admitting: *Deleted

## 2022-11-02 NOTE — Telephone Encounter (Signed)
Patient said PA is needed for Nurtec 75 mg tablet.

## 2022-11-02 NOTE — Telephone Encounter (Signed)
Completed PA on CoverMyMeds on 11/02/2022 Key ([id number])

## 2022-11-04 ENCOUNTER — Encounter: Payer: Self-pay | Admitting: Allergy & Immunology

## 2022-11-04 ENCOUNTER — Other Ambulatory Visit: Payer: Self-pay

## 2022-11-04 ENCOUNTER — Telehealth: Payer: Self-pay | Admitting: Podiatry

## 2022-11-04 ENCOUNTER — Ambulatory Visit: Payer: Medicare PPO | Admitting: Allergy & Immunology

## 2022-11-04 VITALS — BP 118/82 | HR 89 | Temp 97.9°F | Resp 16 | Ht 64.5 in | Wt 175.0 lb

## 2022-11-04 DIAGNOSIS — B999 Unspecified infectious disease: Secondary | ICD-10-CM | POA: Diagnosis not present

## 2022-11-04 DIAGNOSIS — J310 Chronic rhinitis: Secondary | ICD-10-CM

## 2022-11-04 DIAGNOSIS — J324 Chronic pansinusitis: Secondary | ICD-10-CM

## 2022-11-04 NOTE — Addendum Note (Signed)
Addended by: Alfonse Spruce on: 11/04/2022 03:48 PM   Modules accepted: Orders

## 2022-11-04 NOTE — Telephone Encounter (Signed)
DOS- 11/30/2022  [place] NAIL REMOVAL 1ST [id number]  [employer] EFFECTIVE DATE- 04/20/2018  PER THE [facility] WEBSITE PORTAL, PRIOR AUTH IS NOT REQUIRED FOR CPT CODE 81191. GOOD FROM 11/30/2022 - 02/28/2023  AUTH Tracking #[id number]

## 2022-11-04 NOTE — Progress Notes (Signed)
NEW PATIENT  Date of Service/Encounter:  11/04/22  Consult requested by: Anabel Halon, MD   Assessment:   Chronic rhinitis - with testing positive to 1 indoor mold (confirming with blood work)  Recurrent infections- getting immune workup today  Chronic pansinusitis  Medical history including an eating disorder  He is smoker (quit February 2024 after 34 years)  Plan/Recommendations:   1. Chronic rhinitis with recurrent sinusitis  - Testing today showed: indoor molds, but I do not think that this is relevant - Copy of test results provided. - We are getting blood work to confirm.  - We are also going to order a sinus CT to get a better view of everything going on INSIDE of your head. - We are going to refer you to see ENT for further workup in case surgery is needed.   - Continue with: Claritin (loratadine) 10mg  tablet once daily - Start taking: budesonide nasal rinses once daily (see recipe below).  - You can use an extra dose of the antihistamine, if needed, for breakthrough symptoms.   2. Recurrent infections - We will obtain some screening labs to evaluate your immune system.  - Labs to evaluate the quantitative [facility]) aspects of your immune system: IgG/IgA/IgM, CBC with differential - Labs to evaluate the qualitative (HOW WELL THEY WORK) aspects of your immune system: CH50, Pneumococcal titers, Tetanus titers, Diphtheria titers - We may consider immunizations with Pneumovax and Tdap to challenge your immune system, and then obtain repeat titers in 4-6 weeks.  3. Return in about 3 months (around 02/04/2023). But we can cancel this if you end up having normal labs!    This note in its entirety was forwarded to the Provider who requested this consultation.  Subjective:   Jennifer [name] is a 57 y.o. female presenting today for evaluation of  Chief Complaint  Patient presents with   Sinus Problem    6 Weeks since she had COVID has done multiple rounds of  antibiotics and nothing has helped. Pain in her face, and runny nose     Jennifer [name] has a history of the following: Patient Active Problem List   Diagnosis Date Noted   Prediabetes 02/13/2022   Centrilobular emphysema (HCC) 12/17/2021   Hospital discharge follow-up 09/17/2021   SVT (supraventricular tachycardia) (HCC) 09/11/2021   Elevated troponin 09/10/2021   Encounter for general adult medical examination with abnormal findings 08/08/2021   Chronic diarrhea 08/08/2021   Fissure in skin of foot 08/08/2021   Allergic rhinitis 02/28/2021   Hyperkeratotic fissured eczema of hand 01/21/2021   Snorings 12/30/2020   PVD (peripheral vascular disease) (HCC) 12/30/2020   Sleep related bruxism 12/30/2020   Thrombocytopenia (HCC) 11/29/2020   Leg swelling 11/29/2020   Left hip pain 11/19/2020   Easy bruising 08/19/2020   Peripheral neuropathy 08/19/2020   Restless leg syndrome 06/13/2020   Tick bite of back 06/13/2020   Acute non-recurrent maxillary sinusitis 11/07/2019   Bulimia 09/12/2018   Gastroesophageal reflux disease 09/12/2018   Migraine without status migrainosus, not intractable 09/12/2018   Mixed bipolar I disorder (HCC) 12/05/2015   OCD (obsessive compulsive disorder) 12/04/2015    History obtained from: chart review and patient.  Discussed the use of AI scribe software for clinical note transcription with the patient and/or guardian, who gave verbal consent to proceed.  Jennifer [name] was referred by Anabel Halon, MD.     Jennifer [name] is a 57 y.o. female presenting for an evaluation of  recurrent sinusitis .  The patient, with a history of bulimia, anorexia, and mental health issues, presents with a persistent sinus infection. Despite four rounds of antibiotics, including multiple Z-Paks and maxillofilurea, the sinus infection has not resolved. The patient reports severe tooth sensitivity, particularly on one side, and fluctuating sinus congestion. The patient's dentist  and endodontist both suggested a sinus infection as the cause of the dental pain, and a root canal was initially considered but later ruled out by the endodontist. The patient also reports a history of eczema, particularly in the winter months, resulting in painful, cracked skin that occasionally appears infected.  The patient's sinus infection was initially diagnosed following a bout of COVID-19, and despite antibiotic treatment, it has persisted. The patient has not had a CT scan of her sinuses. The patient has been using saline and fluticasone for symptom management, with limited relief. The patient also reports a history of supraventricular tachycardia (SVT), which she believes may have been triggered by her bulimia.  The patient has a known allergy to penicillin, which causes severe reactions, including hospitalization. The patient has never been allergy tested before. The patient quit smoking in February. The patient is currently disabled, having previously worked as a Runner, broadcasting/film/video for 25 years. 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    Otherwise, there is no history of other atopic diseases, including drug allergies, stinging insect allergies, or contact dermatitis. There is no significant infectious history. Vaccinations are up to date.    Past Medical History: Patient Active Problem List   Diagnosis Date Noted   Prediabetes 02/13/2022   Centrilobular emphysema (HCC) 12/17/2021   Hospital discharge follow-up 09/17/2021   SVT (supraventricular tachycardia) (HCC) 09/11/2021   Elevated troponin 09/10/2021   Encounter for general adult medical examination with abnormal findings 08/08/2021   Chronic diarrhea 08/08/2021   Fissure in skin of foot 08/08/2021   Allergic rhinitis 02/28/2021   Hyperkeratotic fissured eczema of hand 01/21/2021   Snorings 12/30/2020   PVD (peripheral vascular disease) (HCC) 12/30/2020   Sleep related bruxism 12/30/2020   Thrombocytopenia (HCC)  11/29/2020   Leg swelling 11/29/2020   Left hip pain 11/19/2020   Easy bruising 08/19/2020   Peripheral neuropathy 08/19/2020   Restless leg syndrome 06/13/2020   Tick bite of back 06/13/2020   Acute non-recurrent maxillary sinusitis 11/07/2019   Bulimia 09/12/2018   Gastroesophageal reflux disease 09/12/2018   Migraine without status migrainosus, not intractable 09/12/2018   Mixed bipolar I disorder (HCC) 12/05/2015   OCD (obsessive compulsive disorder) 12/04/2015    Medication List:  Allergies as of 11/04/2022       Reactions   Codeine Shortness Of Breath, Nausea Only, Rash   Penicillins Hives, Rash, Shortness Of Breath, Swelling   Has patient had a PCN reaction causing immediate rash, facial/tongue/throat swelling, SOB or lightheadedness with hypotension: yes Has patient had a PCN reaction causing severe rash involving mucus membranes or skin necrosis: no Has patient had a PCN reaction that required hospitalization: no Has patient had a PCN reaction occurring within the last 10 years: no If all of the above answers are "NO", then may proceed with Cephalosporin use.; Has patient had a PCN reaction causing immediate rash, facial/tongue/throat swelling, SOB or lightheadedness with hypotension: yes, Has patient had a PCN reaction causing severe rash involving mucus membranes or skin necrosis: no, Has patient had a PCN reaction that required hospitalization: no, Has patient had a PCN reaction occurring within the last 10 years: no,  If all of the above answers are "NO", then may proceed with Cephalosporin use.;   Depakote [divalproex Sodium] Hives, Itching, Rash   Mobic [meloxicam] Nausea And Vomiting, Other (See Comments)   Possible chest tightness - instructed by MD not to take   Sonata [zaleplon] Other (See Comments)   Hallucinations   Topamax [topiramate] Other (See Comments)   Low BP and dizziness   Aciphex [rabeprazole] Swelling   Aimovig [erenumab-aooe] Itching   Emgality  [galcanezumab-gnlm] Hives, Rash        Medication List        Accurate as of November 04, 2022  3:41 PM. If you have any questions, ask your nurse or doctor.          albuterol 108 (90 Base) MCG/ACT inhaler Commonly known as: VENTOLIN HFA Inhale 2 puffs into the lungs every 6 (six) hours as needed for wheezing or shortness of breath.   ciclopirox 8 % solution Commonly known as: PENLAC Apply topically at bedtime. Apply over nail and surrounding skin. Apply daily over previous coat. After seven (7) days, may remove with alcohol and continue cycle.   clonazePAM 0.5 MG tablet Commonly known as: KLONOPIN Take 1 tablet (0.5 mg total) by mouth 3 (three) times daily as needed for anxiety.   colestipol 1 g tablet Commonly known as: COLESTID Take 4 g by mouth daily as needed (diarrhea).   DAILY PROBIOTIC PO Take 2 capsules by mouth daily with lunch.   dicyclomine 20 MG tablet Commonly known as: BENTYL Take 1 tablet (20 mg total) by mouth 3 (three) times daily as needed for spasms (Diarrhea).   esomeprazole 20 MG capsule Commonly known as: NexIUM Take 1 capsule (20 mg total) by mouth daily.   fluconazole 150 MG tablet Commonly known as: DIFLUCAN Take 1 tablet (150 mg total) by mouth every 3 (three) days.   fluticasone 50 MCG/ACT nasal spray Commonly known as: FLONASE Place 2 sprays into both nostrils daily.   fluvoxaMINE 100 MG tablet Commonly known as: LUVOX Take 2 tablets (200 mg total) by mouth 2 (two) times daily.   gabapentin 600 MG tablet Commonly known as: NEURONTIN 2 p.o. every morning, 1 p.o. q. afternoon, 2 p.o. nightly.   hydrOXYzine 25 MG tablet Commonly known as: ATARAX Take 1-2 tablets (25-50 mg total) by mouth every 8 (eight) hours as needed. What changed:  how much to take when to take this reasons to take this   loratadine 10 MG tablet Commonly known as: CLARITIN Take 10 mg by mouth in the morning and at bedtime. Morning & afternoon.    mirtazapine 15 MG tablet Commonly known as: REMERON Take 1 tablet (15 mg total) by mouth daily as needed (tremors).   naproxen sodium 220 MG tablet Commonly known as: ALEVE Take 660 mg by mouth daily as needed (migraine).   Nurtec 75 MG Tbdp Generic drug: Rimegepant Sulfate Take 75 mg by mouth daily as needed (take for abortive therapy of migraine, no more than 1 tablet in 24 hours or 8 per month).   ondansetron 4 MG disintegrating tablet Commonly known as: ZOFRAN-ODT Take 1 tablet (4 mg total) by mouth every 8 (eight) hours as needed for nausea or vomiting.   PEPCID COMPLETE PO Take 1 tablet by mouth in the morning.   pramipexole 0.5 MG tablet Commonly known as: MIRAPEX Take 1 tablet (0.5 mg total) by mouth at bedtime as needed.   Restasis 0.05 % ophthalmic emulsion Generic drug: cycloSPORINE Place 1 drop into  both eyes 2 (two) times daily.   [name] 4 MG Tabs Generic drug: Brexpiprazole Take 4 mg by mouth in the morning.   sucralfate 1 g tablet Commonly known as: Carafate Take 1 g slurry around meals and at bedtime as needed for nausea and epigastric pain.   traZODone 100 MG tablet Commonly known as: DESYREL Take 1-2 tablets (100-200 mg total) by mouth at bedtime as needed for sleep.   valACYclovir 500 MG tablet Commonly known as: VALTREX Take 500 mg by mouth daily at 12 noon.        Birth History: non-contributory  Developmental History: non-contributory  Past Surgical History: Past Surgical History:  Procedure Laterality Date   BLADDER SURGERY     CHOLECYSTECTOMY     COLONOSCOPY WITH PROPOFOL N/A 10/05/2019   Procedure: COLONOSCOPY WITH PROPOFOL;  Surgeon: Corbin Ade, MD;  [facility];  Service: Endoscopy;  Laterality: N/A;  12:45pm   COSMETIC SURGERY N/A    [employer] 01/28/2020   ESOPHAGOGASTRODUODENOSCOPY  09/28/2016   [facility]; Dr. Marca Ancona; erosions in the esophagus, 5 cm hiatal hernia, nonbleeding erosive gastropathy s/p biopsied,  normal duodenum.  Path with chronic inactive gastritis, no H. pylori or intestinal metaplasia.    ESOPHAGOGASTRODUODENOSCOPY (EGD) WITH PROPOFOL N/A 10/17/2018   Dr. Jena Gauss: mild reflux esophagitis, small hiatal hernia   ESOPHAGOGASTRODUODENOSCOPY (EGD) WITH PROPOFOL N/A 05/20/2022   Procedure: ESOPHAGOGASTRODUODENOSCOPY (EGD) WITH PROPOFOL;  Surgeon: Corbin Ade, MD;  Location: [facility];  Service: Endoscopy;  Laterality: N/A;  7:30am;ASA 3   POLYPECTOMY  10/05/2019   Procedure: POLYPECTOMY;  Surgeon: Corbin Ade, MD;  Location: [facility];  Service: Endoscopy;;   SVT ABLATION N/A 10/31/2021   Procedure: SVT ABLATION;  Surgeon: Maurice Small, MD;  Location: [facility];  Service: Cardiovascular;  Laterality: N/A;   VOCAL CORD LATERALIZATION, ENDOSCOPIC APPROACH W/ MLB     vocal cord nodule removed       Family History: Family History  Problem Relation Age of Onset   Healthy Mother    Healthy Father    Migraines Paternal Grandmother    Colon cancer Neg Hx    Colon polyps Neg Hx      Social History: Jennifer [name] lives at home with her family.  The house that was built in the 1970s.  There is hardwood throughout the home.  They have electric heating and central cooling.  There is 1 large dog inside of the home.  There are no dust mite covers completely.  There is no tobacco exposure.  There is no fume, chemical, or dust exposure.  She does not have a HEPA filter.  She did quit smoking in February 2024 after smoking for 34 years.   Review of systems otherwise negative other than that mentioned in the HPI.    Objective:   Blood pressure 118/82, pulse 89, temperature 97.9 F (36.6 C), resp. rate 16, height 5' 4.5" (1.638 m), weight 175 lb (79.4 kg), last menstrual period 10/07/2011, SpO2 95%. Body mass index is 29.57 kg/m.     Physical Exam Vitals reviewed.  Constitutional:      Appearance: She is well-developed.     Comments: Very pleasant.  Talkative.   HENT:     Head: Normocephalic and atraumatic.     Right Ear: Tympanic membrane, ear canal and external ear normal. No drainage, swelling or tenderness. Tympanic membrane is not injected, scarred, erythematous, retracted or bulging.     Left Ear: Tympanic membrane,  ear canal and external ear normal. No drainage, swelling or tenderness. Tympanic membrane is not injected, scarred, erythematous, retracted or bulging.     Nose: No nasal deformity, septal deviation, mucosal edema or rhinorrhea.     Right Turbinates: Enlarged, swollen and pale.     Left Turbinates: Enlarged, swollen and pale.     Right Sinus: No maxillary sinus tenderness or frontal sinus tenderness.     Left Sinus: No maxillary sinus tenderness or frontal sinus tenderness.     Mouth/Throat:     Mouth: Mucous membranes are not pale and not dry.     Pharynx: Uvula midline.  Eyes:     General:        Right eye: No discharge.        Left eye: No discharge.     Conjunctiva/sclera: Conjunctivae normal.     Right eye: Right conjunctiva is not injected. No chemosis.    Left eye: Left conjunctiva is not injected. No chemosis.    Pupils: Pupils are equal, round, and reactive to light.  Cardiovascular:     Rate and Rhythm: Normal rate and regular rhythm.     Heart sounds: Normal heart sounds.  Pulmonary:     Effort: Pulmonary effort is normal. No tachypnea, accessory muscle usage or respiratory distress.     Breath sounds: Normal breath sounds. No wheezing, rhonchi or rales.  Chest:     Chest wall: No tenderness.  Abdominal:     Tenderness: There is no abdominal tenderness. There is no guarding or rebound.  Lymphadenopathy:     Head:     Right side of head: No submandibular, tonsillar or occipital adenopathy.     Left side of head: No submandibular, tonsillar or occipital adenopathy.     Cervical: No cervical adenopathy.  Skin:    Coloration: Skin is not pale.     Findings: No abrasion, erythema, petechiae or rash. Rash is not  papular, urticarial or vesicular.  Neurological:     Mental Status: She is alert.  Psychiatric:        Behavior: Behavior is cooperative.      Diagnostic studies:   Allergy Studies:     Airborne Adult Perc - 11/04/22 1449     Time Antigen Placed 1430    Allergen Manufacturer Waynette Buttery    Location Back    Number of Test 55    1. Control-Buffer 50% Glycerol Negative    2. Control-Histamine 2+    3. Bahia Negative    4. French Southern Territories Negative    5. Johnson Negative    6. Kentucky Blue Negative    7. Meadow Fescue Negative    8. Perennial Rye Negative    9. Timothy Negative    10. Ragweed Mix Negative    11. Cocklebur Negative    12. Plantain,  English Negative    13. Baccharis Negative    14. Dog Fennel Negative    15. Russian Thistle Negative    16. Lamb's Quarters Negative    17. Sheep Sorrell Negative    18. Rough Pigweed Negative    19. Marsh Elder, Rough Negative    20. Mugwort, Common Negative    21. Box, Elder Negative    22. Cedar, red Negative    23. Sweet Gum Negative    24. Pecan Pollen Negative    25. Pine Mix Negative    26. Walnut, Black Pollen Negative    27. Red Mulberry Negative    28. Ash Mix  Negative    29. Birch Mix Negative    30. Beech American Negative    31. Cottonwood, Guinea-Bissau Negative    32. Hickory, White Negative    33. Maple Mix Negative    34. Oak, Guinea[place]iss[place] Mix Negative    35. Sycamore Eastern Negative    36. Alternaria Alternata Negative    37. Cladosporium Herbarum Negative    38. Aspergillus Mix Negative    39. Penicillium Mix Negative    40. Bipolaris Sorokiniana (Helminthosporium) Negative    41. Drechslera Spicifera (Curvularia) Negative    42. Mucor Plumbeus Negative    43. Fusarium Moniliforme 2+    44. Aureobasidium Pullulans (pullulara) Negative    45. Rhizopus Oryzae Negative    46. Botrytis Cinera Negative    47. Epicoccum Nigrum Negative    48. Phoma Betae Negative    49. Dust Mite Mix Negative    50. Cat Hair 10,000  BAU/ml Negative    51.  Dog Epithelia Negative    52. Mixed Feathers Negative    53. Horse Epithelia Negative    54. Cockroach, German Negative    55. Tobacco Leaf Negative             Allergy testing results were read and interpreted by myself, documented by clinical staff.         Malachi Bonds, MD [facility]

## 2022-11-04 NOTE — Patient Instructions (Addendum)
1. Chronic rhinitis with recurrent sinusitis  - Testing today showed: indoor molds, but I do not think that this is relevant - Copy of test results provided. - We are getting blood work to confirm.  - We are also going to order a sinus CT to get a better view of everything going on INSIDE of your head. - We are going to refer you to see ENT for further workup in case surgery is needed.   - Continue with: Claritin (loratadine) 10mg  tablet once daily - Start taking: budesonide nasal rinses once daily (see recipe below).  - You can use an extra dose of the antihistamine, if needed, for breakthrough symptoms.   2. Recurrent infections - We will obtain some screening labs to evaluate your immune system.  - Labs to evaluate the quantitative [facility]) aspects of your immune system: IgG/IgA/IgM, CBC with differential - Labs to evaluate the qualitative (HOW WELL THEY WORK) aspects of your immune system: CH50, Pneumococcal titers, Tetanus titers, Diphtheria titers - We may consider immunizations with Pneumovax and Tdap to challenge your immune system, and then obtain repeat titers in 4-6 weeks.  3. Return in about 3 months (around 02/04/2023). But we can cancel this if you end up having normal labs!    Please inform us of any Emergency Department visits, hospitalizations, or changes in symptoms. Call us before going to the ED for breathing or allergy symptoms since we might be able to fit you in for a sick visit. Feel free to contact us anytime with any questions, problems, or concerns.  It was a pleasure to meet you today!  Websites that have reliable patient information: 1. American Academy of Asthma, Allergy, and Immunology: www.aaaai.org 2. Food Allergy Research and Education (FARE): foodallergy.org 3. Mothers of Asthmatics: http://www.asthmacommunitynetwork.org 4. American College of Allergy, Asthma, and Immunology: www.acaai.org   COVID-19 Vaccine Information can be found at:  [email or web address] For questions related to vaccine distribution or appointments, please email [email or web address] or call [phone].     "Like" [place] on Facebook and Instagram for our latest updates!      A healthy democracy works best when [employer] participate! Make sure you are registered to vote! If you have moved or changed any of your contact information, you will need to get this updated before voting! Scan the QR codes below to learn more!         Airborne Adult Perc - 11/04/22 1449     Time Antigen Placed 1430    Allergen Manufacturer Waynette Buttery    Location Back    Number of Test 55    1. Control-Buffer 50% Glycerol Negative    2. Control-Histamine 2+    3. Bahia Negative    4. French Southern Territ[place] Negative    5. Johnson Negative    6. Kentucky Blue Negative    7. Meadow Fescue Negative    8. Perennial Rye Negative    9. Timothy Negative    10. Ragweed Mix Negative    11. Cocklebur Negative    12. Plantain,  English Negative    13. Baccharis Negative    14. Dog Fennel Negative    15. Russian Thistle Negative    16. Lamb's Quarters Negative    17. Sheep Sorrell Negative    18. Rough Pigweed Negative    19. Marsh Elder, Rough Negative    20. Mugwort, Common Negative    21. Box, Elder Negative    22. Cedar, red Negative    23. Sweet Gum  Negative    24. Pecan Pollen Negative    25. Pine Mix Negative    26. Walnut, Black Pollen Negative    27. Red Mulberry Negative    28. Ash Mix Negative    29. Birch Mix Negative    30. Beech American Negative    31. Cottonwood, Guinea-Bissau Negative    32. Hickory, White Negative    33. Maple Mix Negative    34. Oak, [place]iss[place] Mix Negative    35. Sycamore Eastern Negative    36. Alternaria Alternata Negative    37. Cladosporium Herbarum Negative    38. Aspergillus Mix Negative    39. Penicillium Mix Negative    40. Bipolaris Sorokiniana (Helminthosporium) Negative    41.  Drechslera Spicifera (Curvularia) Negative    42. Mucor Plumbeus Negative    43. Fusarium Moniliforme 2+    44. Aureobasidium Pullulans (pullulara) Negative    45. Rhizopus Oryzae Negative    46. Botrytis Cinera Negative    47. Epicoccum Nigrum Negative    48. Phoma Betae Negative    49. Dust Mite Mix Negative    50. Cat Hair 10,000 BAU/ml Negative    51.  Dog Epithelia Negative    52. Mixed Feathers Negative    53. Horse Epithelia Negative    54. Cockroach, German Negative    55. Tobacco Leaf Negative             Control of Mold Allergen   Mold and fungi can grow on a variety of surfaces provided certain temperature and moisture conditions exist.  Outdoor molds grow on plants, decaying vegetation and soil.  The major outdoor mold, Alternaria and Cladosporium, are found in very high numbers during hot and dry conditions.  Generally, a late Summer - Fall peak is seen for common outdoor fungal spores.  Rain will temporarily lower outdoor mold spore count, but counts rise rapidly when the rainy period ends.  The most important indoor molds are Aspergillus and Penicillium.  Dark, humid and poorly ventilated basements are ideal sites for mold growth.  The next most common sites of mold growth are the bathroom and the kitchen.  Indoor (Perennial) Mold Control   Positive indoor molds via skin testing: Fusarium  Maintain humidity below 50%. Clean washable surfaces with 5% bleach solution. Remove sources e.g. contaminated carpets.

## 2022-11-05 ENCOUNTER — Other Ambulatory Visit: Payer: Self-pay

## 2022-11-05 ENCOUNTER — Telehealth: Payer: Self-pay

## 2022-11-05 MED ORDER — UBRELVY 100 MG PO TABS: ORAL_TABLET | ORAL | 11 refills | Status: DC

## 2022-11-05 NOTE — Telephone Encounter (Signed)
Can you all please see if pt's Ubrevly 100mg  needs PA so she can get it before the weekend.   Thank You

## 2022-11-08 LAB — ALLERGENS W/COMP RFLX AREA 2
Alternaria Alternata IgE: <0.1 kU/L
Aspergillus Fumigatus IgE: <0.1 kU/L
Bermuda Grass IgE: <0.1 kU/L
Cedar, Mountain IgE: <0.1 kU/L
Cladosporium Herbarum IgE: <0.1 kU/L
Cockroach, German IgE: <0.1 kU/L
Common Silver Birch IgE: <0.1 kU/L
Cottonwood IgE: <0.1 kU/L
D Farinae IgE: <0.1 kU/L
D Pteronyssinus IgE: <0.1 kU/L
E001-IgE Cat Dander: <0.1 kU/L
E005-IgE Dog Dander: <0.1 kU/L
Elm, American IgE: <0.1 kU/L
IgE (Immunoglobulin E), Serum: 44 [IU]/mL (ref 6–495)
Johnson Grass IgE: <0.1 kU/L
Maple/Box Elder IgE: <0.1 kU/L
Mouse Urine IgE: <0.1 kU/L
Oak, White IgE: <0.1 kU/L
Pecan, Hickory IgE: <0.1 kU/L
Penicillium Chrysogen IgE: <0.1 kU/L
Pigweed, Rough IgE: <0.1 kU/L
Ragweed, Short IgE: <0.1 kU/L
Sheep Sorrel IgE Qn: <0.1 kU/L
Timothy Grass IgE: <0.1 kU/L
White Mulberry IgE: <0.1 kU/L

## 2022-11-08 LAB — CBC WITH DIFFERENTIAL/PLATELET
Basophils Absolute: 0.1 10*3/uL (ref 0.0–0.2)
Basos: 1 %
EOS (ABSOLUTE): 0.1 10*3/uL (ref 0.0–0.4)
Eos: 2 %
Hematocrit: 42.1 % (ref 34.0–46.6)
Hemoglobin: 14.4 g/dL (ref 11.1–15.9)
Immature Grans (Abs): 0 10*3/uL (ref 0.0–0.1)
Immature Granulocytes: 0 %
Lymphocytes Absolute: 2.5 10*3/uL (ref 0.7–3.1)
Lymphs: 32 %
MCH: 31.2 pg (ref 26.6–33.0)
MCHC: 34.2 g/dL (ref 31.5–35.7)
MCV: 91 fL (ref 79–97)
Monocytes Absolute: 0.8 10*3/uL (ref 0.1–0.9)
Monocytes: 10 %
Neutrophils Absolute: 4.3 10*3/uL (ref 1.4–7.0)
Neutrophils: 55 %
Platelets: 223 10*3/uL (ref 150–450)
RBC: 4.61 x10E6/uL (ref 3.77–5.28)
RDW: 12.2 % (ref 11.7–15.4)
WBC: 7.7 10*3/uL (ref 3.4–10.8)

## 2022-11-08 LAB — STREP PNEUMONIAE 23 SEROTYPES IGG
Pneumo Ab Type 1*: 1.4 ug/mL (ref >1.3)
Pneumo Ab Type 14*: 3.6 ug/mL (ref >1.3)
Pneumo Ab Type 17 (17F)*: 0.4 ug/mL — ABNORMAL LOW (ref >1.3)
Pneumo Ab Type 19 (19F)*: 1.1 ug/mL — ABNORMAL LOW (ref >1.3)
Pneumo Ab Type 20*: 0.5 ug/mL — ABNORMAL LOW (ref >1.3)
Pneumo Ab Type 22 (22F)*: 0.5 ug/mL — ABNORMAL LOW (ref >1.3)
Pneumo Ab Type 23 (23F)*: 0.3 ug/mL — ABNORMAL LOW (ref >1.3)
Pneumo Ab Type 26 (6B)*: 1.8 ug/mL (ref >1.3)
Pneumo Ab Type 3*: 0.3 ug/mL — ABNORMAL LOW (ref >1.3)
Pneumo Ab Type 4*: 0.5 ug/mL — ABNORMAL LOW (ref >1.3)
Pneumo Ab Type 43 (11A)*: 0.7 ug/mL — ABNORMAL LOW (ref >1.3)
Pneumo Ab Type 5*: 0.7 ug/mL — ABNORMAL LOW (ref >1.3)
Pneumo Ab Type 51 (7F)*: 2.3 ug/mL (ref >1.3)
Pneumo Ab Type 54 (15B)*: 6.9 ug/mL (ref >1.3)
Pneumo Ab Type 56 (18C)*: 2.4 ug/mL (ref >1.3)
Pneumo Ab Type 57 (19A)*: 2.3 ug/mL (ref >1.3)
Pneumo Ab Type 68 (9V)*: 0.6 ug/mL — ABNORMAL LOW (ref >1.3)
Pneumo Ab Type 70 (33F)*: 2.4 ug/mL (ref >1.3)
Pneumo Ab Type 8*: 16.8 ug/mL (ref >1.3)

## 2022-11-08 LAB — DIPHTHERIA / TETANUS ANTIBODY PANEL
Diphtheria Ab: 0.26 [IU]/mL (ref <0.10)
Tetanus Ab, IgG: 0.29 [IU]/mL (ref <0.10)

## 2022-11-08 LAB — ALLERGEN PROFILE, MOLD
Aureobasidi Pullulans IgE: <0.1 kU/L
Candida Albicans IgE: <0.1 kU/L
M009-IgE Fusarium proliferatum: <0.1 kU/L
M014-IgE Epicoccum purpur: <0.1 kU/L
Mucor Racemosus IgE: <0.1 kU/L
Phoma Betae IgE: <0.1 kU/L
Setomelanomma Rostrat: <0.1 kU/L
Stemphylium Herbarum IgE: <0.1 kU/L

## 2022-11-08 LAB — IGG, IGA, IGM
IgA/Immunoglobulin A, Serum: 114 mg/dL (ref 87–352)
IgG (Immunoglobin G), Serum: 975 mg/dL (ref 586–1602)
IgM (Immunoglobulin M), Srm: 253 mg/dL — ABNORMAL HIGH (ref 26–217)

## 2022-11-08 LAB — COMPLEMENT, TOTAL: Compl, Total (CH50): >60 U/mL (ref >41)

## 2022-11-10 ENCOUNTER — Telehealth: Payer: Self-pay

## 2022-11-10 ENCOUNTER — Encounter: Payer: Self-pay | Admitting: Allergy & Immunology

## 2022-11-10 ENCOUNTER — Ambulatory Visit (INDEPENDENT_AMBULATORY_CARE_PROVIDER_SITE_OTHER): Payer: Medicare PPO

## 2022-11-10 ENCOUNTER — Other Ambulatory Visit (HOSPITAL_COMMUNITY): Payer: Self-pay

## 2022-11-10 DIAGNOSIS — Z23 Encounter for immunization: Secondary | ICD-10-CM

## 2022-11-10 NOTE — Telephone Encounter (Signed)
PA request has been Submitted. New Encounter created for follow up. For additional info see Pharmacy Prior Auth telephone encounter from 11/10/2022.

## 2022-11-10 NOTE — Telephone Encounter (Signed)
PA Approved  (KeyKristin Bruins) - [id number]

## 2022-11-10 NOTE — Telephone Encounter (Signed)
Pharmacy Patient Advocate Encounter   Received notification from Physician's Office that prior authorization for Ubrelvy 100MG  Tablets is required/requested.   Insurance verification completed.   The patient is insured through [employer] .   Per test claim: PA required; PA submitted to [facility] via CoverMyMeds Key/confirmation #/EOC [facility] Status is pending

## 2022-11-10 NOTE — Telephone Encounter (Signed)
Pharmacy Patient Advocate Encounter  Received notification from [facility] that Prior Authorization for Ubrelvy 100MG  Tablet has been APPROVED from 01/19/2022 to 01/19/2024. Ran test claim, Copay is $40.00. This test claim was processed through [facility]- copay amounts may vary at other pharmacies due to pharmacy/plan contracts, or as the patient moves through the different stages of their insurance plan.   PA #/Case ID/Reference #: PA Case ID #: [id number]

## 2022-11-10 NOTE — Telephone Encounter (Signed)
[facility] calling to follow up on intake forms. Please advise Thanks

## 2022-11-11 DIAGNOSIS — H10013 Acute follicular conjunctivitis, bilateral: Secondary | ICD-10-CM | POA: Diagnosis not present

## 2022-11-11 NOTE — Patient Instructions (Signed)
Below is our plan:  We will continue Ubrelvy as needed. Let me know if you have any trouble taking this medication.   Please make sure you are staying well hydrated. I recommend 50-60 ounces daily. Well balanced diet and regular exercise encouraged. Consistent sleep schedule with 6-8 hours recommended.   Please continue follow up with care team as directed.   Follow up with me in 1 year   You may receive a survey regarding today's visit. I encourage you to leave honest feed back as I do use this information to improve patient care. Thank you for seeing me today!   GENERAL HEADACHE INFORMATION:   Natural supplements: Magnesium Oxide or Magnesium Glycinate 500 mg at bed (up to 800 mg daily) Coenzyme Q10 300 mg in AM Vitamin B2- 200 mg twice a day   Add 1 supplement at a time since even natural supplements can have undesirable side effects. You can sometimes buy supplements cheaper (especially Coenzyme Q10) at www.WebmailGuide.co.za or at [facility].  Migraine with aura: There is increased risk for stroke in women with migraine with aura and a contraindication for the combined contraceptive pill for use by women who have migraine with aura. The risk for women with migraine without aura is lower. However other risk factors like smoking are far more likely to increase stroke risk than migraine. There is a recommendation for no smoking and for the use of OCPs without estrogen such as progestogen only pills particularly for women with migraine with aura.Marland Kitchen People who have migraine headaches with auras may be 3 times more likely to have a stroke caused by a blood clot, compared to migraine patients who don't see auras. Women who take hormone-replacement therapy may be 30 percent more likely to suffer a clot-based stroke than women not taking medication containing estrogen. Other risk factors like smoking and high blood pressure may be  much more important.    Vitamins and herbs that show potential:   Magnesium:  Magnesium (250 mg twice a day or 500 mg at bed) has a relaxant effect on smooth muscles such as blood vessels. Individuals suffering from frequent or daily headache usually have low magnesium levels which can be increase with daily supplementation of 400-750 mg. Three trials found 40-90% average headache reduction  when used as a preventative. Magnesium may help with headaches are aura, the best evidence for magnesium is for migraine with aura is its thought to stop the cortical spreading depression we believe is the pathophysiology of migraine aura.Magnesium also demonstrated the benefit in menstrually related migraine.  Magnesium is part of the messenger system in the serotonin cascade and it is a good muscle relaxant.  It is also useful for constipation which can be a side effect of other medications used to treat migraine. Good sources include nuts, whole grains, and tomatoes. Side Effects: loose stool/diarrhea  Riboflavin (vitamin B 2) 200 mg twice a day. This vitamin assists nerve cells in the production of ATP a principal energy storing molecule.  It is necessary for many chemical reactions in the body.  There have been at least 3 clinical trials of riboflavin using 400 mg per day all of which suggested that migraine frequency can be decreased.  All 3 trials showed significant improvement in over half of migraine sufferers.  The supplement is found in bread, cereal, milk, meat, and poultry.  Most Americans get more riboflavin than the recommended daily allowance, however riboflavin deficiency is not necessary for the supplements to help prevent headache.  Side effects: energizing, green urine   Coenzyme Q10: This is present in almost all cells in the body and is critical component for the conversion of energy.  Recent studies have shown that a nutritional supplement of CoQ10 can reduce the frequency of migraine attacks by improving the energy production of cells as with riboflavin.  Doses of 150 mg twice a  day have been shown to be effective.   Melatonin: Increasing evidence shows correlation between melatonin secretion and headache conditions.  Melatonin supplementation has decreased headache intensity and duration.  It is widely used as a sleep aid.  Sleep is natures way of dealing with migraine.  A dose of 3 mg is recommended to start for headaches including cluster headache. Higher doses up to 15 mg has been reviewed for use in Cluster headache and have been used. The rationale behind using melatonin for cluster is that many theories regarding the cause of Cluster headache center around the disruption of the normal circadian rhythm in the brain.  This helps restore the normal circadian rhythm.   HEADACHE DIET: Foods and beverages which may trigger migraine Note that only 20% of headache patients are food sensitive. You will know if you are food sensitive if you get a headache consistently 20 minutes to 2 hours after eating a certain food. Only cut out a food if it causes headaches, otherwise you might remove foods you enjoy! What matters most for diet is to eat a well balanced healthy diet full of vegetables and low fat protein, and to not miss meals.   Chocolate, other sweets ALL cheeses except cottage and cream cheese Dairy products, yogurt, sour cream, ice cream Liver Meat extracts (Bovril, Marmite, meat tenderizers) Meats or fish which have undergone aging, fermenting, pickling or smoking. These include: Hotdogs,salami,Lox,sausage, mortadellas,smoked salmon, pepperoni, Pickled herring Pods of broad bean (English beans, Chinese pea pods, [place] (fava) beans, lima and navy beans Ripe avocado, ripe banana Yeast extracts or active yeast preparations such as Brewer's or Fleishman's (commercial bakes goods are permitted) Tomato based foods, pizza (lasagna, etc.)   MSG (monosodium glutamate) is disguised as many things; look for these common aliases: Monopotassium glutamate Autolysed  yeast Hydrolysed protein Sodium caseinate "flavorings" "all natural preservatives" Nutrasweet   Avoid all other foods that convincingly provoke headaches.   Resources: The Dizzy [place] Laundry Your Headache Diet, migrainestrong.com  https://zamora-andrews.com/   Caffeine and Migraine For patients that have migraine, caffeine intake more than 3 days per week can lead to dependency and increased migraine frequency. I would recommend cutting back on your caffeine intake as best you can. The recommended amount of caffeine is 200-300 mg daily, although migraine patients may experience dependency at even lower doses. While you may notice an increase in headache temporarily, cutting back will be helpful for headaches in the long run. For more information on caffeine and migraine, visit: https://americanmigrainefoundation.org/resource-library/caffeine-and-migraine/   Headache Prevention Strategies:   1. Maintain a headache diary; learn to identify and avoid triggers.  - This can be a simple note where you log when you had a headache, associated symptoms, and medications used - There are several smartphone apps developed to help track migraines: Migraine Buddy, Migraine Monitor, Curelator N1-Headache App   Common triggers include: Emotional triggers: Emotional/Upset family or friends Emotional/Upset occupation Business reversal/success Anticipation anxiety Crisis-serious Post-crisis periodNew job/position   Physical triggers: Vacation Day Weekend Strenuous Exercise High Altitude Location New Move Menstrual Day Physical Illness Oversleep/Not enough sleep Weather changes Light: Photophobia or light sesnitivity treatment  involves a balance between desensitization and reduction in overly strong input. Use dark polarized glasses outside, but not inside. Avoid bright or fluorescent light, but do not dim environment to the point that going into a  normally lit room hurts. Consider FL-41 tint lenses, which reduce the most irritating wavelengths without blocking too much light.  These can be obtained at axonoptics.com or theraspecs.com Foods: see list above.   2. Limit use of acute treatments (over-the-counter medications, triptans, etc.) to no more than 2 days per week or 10 days per month to prevent medication overuse headache (rebound headache).     3. Follow a regular schedule (including weekends and holidays): Don't skip meals. Eat a balanced diet. 8 hours of sleep nightly. Minimize stress. Exercise 30 minutes per day. Being overweight is associated with a 5 times increased risk of chronic migraine. Keep well hydrated and drink 6-8 glasses of water per day.   4. Initiate non-pharmacologic measures at the earliest onset of your headache. Rest and quiet environment. Relax and reduce stress. Breathe2Relax is a free app that can instruct you on    some simple relaxtion and breathing techniques. Http://Dawnbuse.com is a    free website that provides teaching videos on relaxation.  Also, there are  many apps that   can be downloaded for "mindful" relaxation.  An app called YOGA NIDRA will help walk you through mindfulness. Another app called Calm can be downloaded to give you a structured mindfulness guide with daily reminders and skill development. Headspace for guided meditation Mindfulness Based Stress Reduction Online Course: www.palousemindfulness.com Cold compresses.   5. Don't wait!! Take the maximum allowable dosage of prescribed medication at the first sign of migraine.   6. Compliance:  Take prescribed medication regularly as directed and at the first sign of a migraine.   7. Communicate:  Call your physician when problems arise, especially if your headaches change, increase in frequency/severity, or become associated with neurological symptoms (weakness, numbness, slurred speech, etc.). Proceed to emergency room if you experience  new or worsening symptoms or symptoms do not resolve, if you have new neurologic symptoms or if headache is severe, or for any concerning symptom.   8. Headache/pain management therapies: Consider various complementary methods, including medication, behavioral therapy, psychological counselling, biofeedback, massage therapy, acupuncture, dry needling, and other modalities.  Such measures may reduce the need for medications. Counseling for pain management, where patients learn to function and ignore/minimize their pain, seems to work very well.   9. Recommend changing family's attention and focus away from patient's headaches. Instead, emphasize daily activities. If first question of day is 'How are your headaches/Do you have a headache today?', then patient will constantly think about headaches, thus making them worse. Goal is to re-direct attention away from headaches, toward daily activities and other distractions.   10. Helpful Websites: www.AmericanHeadacheSociety.org PatentHood.ch www.headaches.org TightMarket.nl www.achenet.org
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
## 2022-11-12 ENCOUNTER — Encounter: Payer: Self-pay | Admitting: Family Medicine

## 2022-11-12 ENCOUNTER — Ambulatory Visit: Payer: Medicare PPO | Admitting: Family Medicine

## 2022-11-12 VITALS — BP 125/76 | HR 85 | Ht 64.0 in | Wt 176.5 lb

## 2022-11-12 DIAGNOSIS — G43709 Chronic migraine without aura, not intractable, without status migrainosus: Secondary | ICD-10-CM | POA: Diagnosis not present

## 2022-11-16 DIAGNOSIS — Z1231 Encounter for screening mammogram for malignant neoplasm of breast: Secondary | ICD-10-CM | POA: Diagnosis not present

## 2022-11-16 DIAGNOSIS — A609 Anogenital herpesviral infection, unspecified: Secondary | ICD-10-CM | POA: Diagnosis not present

## 2022-11-16 DIAGNOSIS — Z01419 Encounter for gynecological examination (general) (routine) without abnormal findings: Secondary | ICD-10-CM | POA: Diagnosis not present

## 2022-11-16 LAB — HM MAMMOGRAPHY

## 2022-11-18 DIAGNOSIS — F4312 Post-traumatic stress disorder, chronic: Secondary | ICD-10-CM | POA: Diagnosis not present

## 2022-11-26 ENCOUNTER — Telehealth: Payer: Self-pay

## 2022-11-26 DIAGNOSIS — J320 Chronic maxillary sinusitis: Secondary | ICD-10-CM | POA: Diagnosis not present

## 2022-11-26 DIAGNOSIS — F429 Obsessive-compulsive disorder, unspecified: Secondary | ICD-10-CM | POA: Diagnosis not present

## 2022-11-26 NOTE — Telephone Encounter (Addendum)
Jennifer [name] called to cancel her surgery with Dr. Allena Katz on 11/30/2022. She stated she has had a sinus infection for 12 weeks. She saw the ENT doctor today and he is ordering a CT scan. She will call back to reschedule once she finds out what's going on. Notified Dr. Allena Katz and Aram Beecham with [facility].

## 2022-11-27 DIAGNOSIS — J320 Chronic maxillary sinusitis: Secondary | ICD-10-CM | POA: Diagnosis not present

## 2022-11-27 DIAGNOSIS — J342 Deviated nasal septum: Secondary | ICD-10-CM | POA: Diagnosis not present

## 2022-12-04 DIAGNOSIS — F4310 Post-traumatic stress disorder, unspecified: Secondary | ICD-10-CM | POA: Diagnosis not present

## 2022-12-04 DIAGNOSIS — F319 Bipolar disorder, unspecified: Secondary | ICD-10-CM | POA: Diagnosis not present

## 2022-12-04 DIAGNOSIS — F429 Obsessive-compulsive disorder, unspecified: Secondary | ICD-10-CM | POA: Diagnosis not present

## 2022-12-09 DIAGNOSIS — F319 Bipolar disorder, unspecified: Secondary | ICD-10-CM | POA: Diagnosis not present

## 2022-12-09 DIAGNOSIS — F4310 Post-traumatic stress disorder, unspecified: Secondary | ICD-10-CM | POA: Diagnosis not present

## 2022-12-09 DIAGNOSIS — F429 Obsessive-compulsive disorder, unspecified: Secondary | ICD-10-CM | POA: Diagnosis not present

## 2022-12-10 ENCOUNTER — Telehealth: Payer: Medicare PPO | Admitting: Physician Assistant

## 2022-12-10 ENCOUNTER — Encounter: Payer: Self-pay | Admitting: Physician Assistant

## 2022-12-10 DIAGNOSIS — F319 Bipolar disorder, unspecified: Secondary | ICD-10-CM | POA: Diagnosis not present

## 2022-12-10 DIAGNOSIS — F429 Obsessive-compulsive disorder, unspecified: Secondary | ICD-10-CM

## 2022-12-10 DIAGNOSIS — G4700 Insomnia, unspecified: Secondary | ICD-10-CM

## 2022-12-10 DIAGNOSIS — F5023 Bulimia nervosa, severe: Secondary | ICD-10-CM

## 2022-12-10 DIAGNOSIS — Z87891 Personal history of nicotine dependence: Secondary | ICD-10-CM | POA: Diagnosis not present

## 2022-12-10 DIAGNOSIS — F4323 Adjustment disorder with mixed anxiety and depressed mood: Secondary | ICD-10-CM | POA: Diagnosis not present

## 2022-12-10 MED ORDER — REXULTI 4 MG PO TABS: 4.0000 mg | ORAL_TABLET | Freq: Every morning | ORAL | 11 refills | Status: DC

## 2022-12-10 NOTE — Progress Notes (Signed)
[facility]  Patient ID: ELASIA VANDEWALKER,  MRN: [id number]  PCP: Anabel Halon, MD  Date of Evaluation: 12/10/2022 time spent:25 minutes  Chief Complaint:  Chief Complaint   Anxiety; Depression; Insomnia; Follow-up    Virtual Visit via Telehealth  I connected with patient by a video enabled telemedicine application, with their informed consent, and verified patient privacy and that I am speaking with the correct person using two identifiers.  I am private, in my office and the patient is at home.  I discussed the limitations, risks, security and privacy concerns of performing an evaluation and management service by video and the availability of in person appointments. I also discussed with the patient that there may be a patient responsible charge related to this service. The patient expressed understanding and agreed to proceed.   I discussed the assessment and treatment plan with the patient. The patient was provided an opportunity to ask questions and all were answered. The patient agreed with the plan and demonstrated an understanding of the instructions.   The patient was advised to call back or seek an in-person evaluation if the symptoms worsen or if the condition fails to improve as anticipated.  I provided 25 minutes of non-face-to-face time during this encounter.  HISTORY/CURRENT STATUS: HPI for routine follow-up.  The main problem that Almadelia is having is insomnia.  She is usually able to go to sleep if she takes the mirtazapine or trazodone but after 2 to 3 hours she wakes up and is awake for 30 minutes to 2 hours.  She denies electronic use within a couple of hours of bedtime.  She takes the trazodone more than the mirtazapine, which was originally prescribed for akathisia..  She has taken melatonin in the distant past and cannot remember if it helped or not.  No nightmares.  Not napping during the day.  As far as her mood goes she is doing well.  She is able  to enjoy things.  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  Is happy to be able to help them some.  Energy and motivation are good as long as she gets enough sleep.  No extreme sadness, tearfulness, or feelings of hopelessness.  ADLs and personal hygiene are normal.   Denies any changes in concentration, making decisions, or remembering things.  Appetite has not changed.  Weight is stable.  Denies laxative use, calorie restricting but still binges and purges.  She is seeing a nutritionist and her therapist for counseling and reference to her eating disorder.  Denies suicidal or homicidal thoughts.  Patient denies increased energy with decreased need for sleep, increased talkativeness, racing thoughts, impulsivity or risky behaviors, increased spending, increased libido, grandiosity, increased irritability or anger, paranoia, or hallucinations.  Denies dizziness, syncope, seizures, numbness, tingling, tremor, tics, unsteady gait, slurred speech, confusion.  No akathisia.  The mirtazapine has really helped that.  Has chronic joint pain. Denies unexplained weight loss, frequent infections, or sores that heal slowly.  No polyphagia, polydipsia, or polyuria. Denies visual changes or paresthesias.   Individual Medical History/ Review of Systems: Changes? :No        Past medications for mental health diagnoses include: Risperdal, Seroquel, Prozac, Zoloft,  Wellbutrin, Lamictal, Depakote caused hair loss, Xanax, Ambien, trazodone, Trileptal, Luvox, Topamax, Elavil, Pamelor, BuSpar, doxazosin, prazosin, lithium, Vraylar, Abilify caused increased appetite and didn't work for [employer], [name], Latuda >60 mg caused abd pain and nausea, propranolol caused diarrhea  Allergies:  Codeine, Penicillins, Depakote [divalproex sodium], Mobic [meloxicam], Sonata [zaleplon], Topamax [topiramate], Aciphex [rabeprazole], Aimovig [erenumab-aooe], and Emgality [galcanezumab-gnlm]  Current  Medications:  Current Outpatient Medications:    albuterol (VENTOLIN HFA) 108 (90 Base) MCG/ACT inhaler, Inhale 2 puffs into the lungs every 6 (six) hours as needed for wheezing or shortness of breath., Disp: 18 g, Rfl: 5   clonazePAM (KLONOPIN) 0.5 MG tablet, Take 1 tablet (0.5 mg total) by mouth 3 (three) times daily as needed for anxiety., Disp: 30 tablet, Rfl: 1   dicyclomine (BENTYL) 20 MG tablet, Take 1 tablet (20 mg total) by mouth 3 (three) times daily as needed for spasms (Diarrhea)., Disp: 30 tablet, Rfl: 1   esomeprazole (NEXIUM) 20 MG capsule, Take 1 capsule (20 mg total) by mouth daily., Disp: 30 capsule, Rfl: 5   Famotidine-Ca Carb-Mag Hydrox (PEPCID COMPLETE PO), Take 1 tablet by mouth in the morning., Disp: , Rfl:    fluticasone (FLONASE) 50 MCG/ACT nasal spray, Place 2 sprays into both nostrils daily., Disp: 16 g, Rfl: 6   fluvoxaMINE (LUVOX) 100 MG tablet, Take 2 tablets (200 mg total) by mouth 2 (two) times daily., Disp: 360 tablet, Rfl: 3   gabapentin (NEURONTIN) 600 MG tablet, 2 p.o. every morning, 1 p.o. q. afternoon, 2 p.o. nightly., Disp: 150 tablet, Rfl: 11   hydrOXYzine (ATARAX) 25 MG tablet, Take 1-2 tablets (25-50 mg total) by mouth every 8 (eight) hours as needed. (Patient taking differently: Take 12.5 mg by mouth at bedtime as needed (sleep).), Disp: 90 tablet, Rfl: 11   loratadine (CLARITIN) 10 MG tablet, Take 10 mg by mouth in the morning and at bedtime. Morning & afternoon., Disp: , Rfl:    mirtazapine (REMERON) 15 MG tablet, Take 1 tablet (15 mg total) by mouth daily as needed (tremors)., Disp: 90 tablet, Rfl: 3   naproxen sodium (ALEVE) 220 MG tablet, Take 660 mg by mouth daily as needed (migraine)., Disp: , Rfl:    ondansetron (ZOFRAN-ODT) 4 MG disintegrating tablet, Take 1 tablet (4 mg total) by mouth every 8 (eight) hours as needed for nausea or vomiting., Disp: 30 tablet, Rfl: 0   Probiotic Product (DAILY PROBIOTIC PO), Take 2 capsules by mouth daily with lunch.,  Disp: , Rfl:    RESTASIS 0.05 % ophthalmic emulsion, Place 1 drop into both eyes 2 (two) times daily., Disp: , Rfl:    sucralfate (CARAFATE) 1 g tablet, Take 1 g slurry around meals and at bedtime as needed for nausea and epigastric pain., Disp: 120 tablet, Rfl: 3   traZODone (DESYREL) 100 MG tablet, Take 1-2 tablets (100-200 mg total) by mouth at bedtime as needed for sleep., Disp: 60 tablet, Rfl: 11   Ubrogepant (UBRELVY) 100 MG TABS, Take 100 mg by mouth with or without food. If needed, a second dose may be taken at least 2 hours after the initial dose. The maximum dose in a 24-hour period is 200 mg., Disp: 10 tablet, Rfl: 11   valACYclovir (VALTREX) 500 MG tablet, Take 500 mg by mouth daily at 12 noon., Disp: , Rfl:    Brexpiprazole ([name]) 4 MG TABS, Take 1 tablet (4 mg total) by mouth in the morning., Disp: 30 tablet, Rfl: 11 Medication Side Effects: none  Family Medical/ Social History: Changes?  No  MENTAL HEALTH EXAM:  Last menstrual period 10/07/2011.There is no height or weight on file to calculate BMI.  General Appearance: Casual and Well Groomed  Eye Contact:  Good  Speech:  Clear and Coherent and  Normal Rate  Volume:  Normal  Mood:  Euthymic  Affect:  Congruent  Thought Process:  Goal Directed and Descriptions of Associations: Circumstantial  Orientation:  Full (Time, Place, and Person)  Thought Content: Logical   Suicidal Thoughts:  No  Homicidal Thoughts:  No  Memory:  WNL  Judgement:  Good  Insight:  Good  Psychomotor Activity:  Normal  Concentration:  Concentration: Good and Attention Span: Good  Recall:  Good  Fund of Knowledge: Good  Language: Good  Assets:  Communication Skills Desire for Improvement Financial Resources/Insurance Housing Transportation  ADL's:  Intact  Cognition: WNL  Prognosis:  Good   Labs 11/04/2022 CBC with differential completely normal  DIAGNOSES:    ICD-10-CM   1. Situational mixed anxiety and depressive disorder  F43.23      2. Insomnia, unspecified type  G47.00     3. Bipolar I disorder (HCC)  F31.9     4. Obsessive-compulsive disorder, unspecified type  F42.9     5. Severe bulimia nervosa  F50.23     6. Former smoker  Z87.891       Receiving Psychotherapy: Yes  Dominic Pea, and Sherron Ales at [facility]  RECOMMENDATIONS:  PDMP was reviewed.  Last Klonopin 09/03/2022.  Gabapentin known to me.   I provided 25 minutes of non-face-to-face time during this encounter, including time spent before and after the visit in records review, medical decision making, counseling pertinent to today's visit, and charting.   We discussed the insomnia.  I recommend trying melatonin again for 3 or 4 nights to see if that will help.  If so then continue that.  If we can get away with it, I do not want her to have to take the mirtazapine every night, due to her eating disorder.  I am afraid that will cause increased hunger and weight gain which is not good with the bulimia.  If the melatonin is not helpful enough then try Klonopin at night.  She understands.  She has received a letter in the mail stating that her insurance will not cover [name] next year until she tries Abilify.  She took that a while back and it did not help symptoms plus made her eat more and gained weight.  Again, that is not acceptable with her eating disorder.  We may have to do an appeal letter if her insurance company will not agree to cover the [name].  Continue [name] 4 mg, 1 p.o. every morning. Continue Klonopin 0.5 mg, 1 p.o. twice daily prn. Continue Luvox 100 mg, 2 p.o. twice daily.   Continue gabapentin 600 mg, 2 qam.,  3 p.o. nightly.  Continue hydroxyzine 25-50 mg, 1 p.o. 3 times daily as needed. Restart melatonin nightly as needed sleep. Continue mirtazapine 15 mg, 1 p.o. nightly as needed tremors/akathisia. Continue pramipexole 0.5 mg nightly.  Per another provider. Continue trazodone 100 mg, 1-2 nightly as needed  sleep. Continue therapy. Return in 6 weeks.  Melony Overly, PA-C

## 2022-12-15 ENCOUNTER — Encounter: Payer: Self-pay | Admitting: Internal Medicine

## 2022-12-15 ENCOUNTER — Ambulatory Visit: Payer: Medicare PPO | Admitting: Internal Medicine

## 2022-12-15 VITALS — BP 101/67 | HR 84 | Ht 64.0 in | Wt 180.0 lb

## 2022-12-15 DIAGNOSIS — H9201 Otalgia, right ear: Secondary | ICD-10-CM

## 2022-12-15 NOTE — Progress Notes (Signed)
   Acute Office Visit  Subjective:     Patient ID: Jennifer [name], female    DOB: 07/18/1965, 57 y.o.   MRN: [id number]  Chief Complaint  Patient presents with   Ear Pain    Right ear pain    Jennifer [name] presents today for an acute visit endorsing a 1 day history of right ear pain.  Symptoms began yesterday afternoon.  She purchased equate otic drops over-the-counter for pain relief and states that symptoms resolved within 2-3 hours.  She currently denies pain in the right ear.  No change in hearing, fever/chills, or drainage from the right ear.  She is concerned because she will undergo surgery to remove ingrown toenails on each foot and surgery has previously been delayed due to a persistent sinus infection.  Review of Systems  Constitutional:  Negative for chills and fever.  HENT:  Positive for ear pain (Right). Negative for ear discharge.       Objective:    BP 101/67 (BP Location: Right Arm, Patient Position: Sitting, Cuff Size: Normal)   Pulse 84   Ht 5\' 4"  (1.626 m)   Wt 180 lb (81.6 kg)   LMP 10/07/2011   SpO2 96%   BMI 30.90 kg/m   Physical Exam Vitals reviewed.  Constitutional:      Appearance: Normal appearance.  HENT:     Head: Normocephalic and atraumatic.     Right Ear: Tympanic membrane, ear canal and external ear normal. There is no impacted cerumen.     Left Ear: Tympanic membrane, ear canal and external ear normal. There is no impacted cerumen.     Nose: Nose normal. No congestion or rhinorrhea.     Mouth/Throat:     Mouth: Mucous membranes are moist.     Pharynx: Oropharynx is clear. No oropharyngeal exudate or posterior oropharyngeal erythema.  Eyes:     Extraocular Movements: Extraocular movements intact.     Conjunctiva/sclera: Conjunctivae normal.     Pupils: Pupils are equal, round, and reactive to light.  Cardiovascular:     Rate and Rhythm: Normal rate and regular rhythm.  Pulmonary:     Effort: Pulmonary effort is normal.     Breath sounds:  Normal breath sounds.  Musculoskeletal:     Cervical back: Normal range of motion.  Lymphadenopathy:     Cervical: No cervical adenopathy.  Neurological:     Mental Status: She is alert.       Assessment & Plan:   Problem List Items Addressed This Visit       Earache on right - Primary    Presenting today for an acute visit endorsing a 1 day history of right ear pain.  Symptoms lasted 2-3 hours yesterday afternoon and resolved with OTC otic drops.  Otic exam today is not concerning for infection.  She denies pain currently.  No further treatment is indicated at this time. -No indication for antibiotics at this time.  She was instructed to return to care if symptoms worsen or fail to improve.  Otherwise, she is scheduled for routine follow-up with her PCP in January.       Return if symptoms worsen or fail to improve.  Billie Lade, MD

## 2022-12-15 NOTE — Assessment & Plan Note (Signed)
Presenting today for an acute visit endorsing a 1 day history of right ear pain.  Symptoms lasted 2-3 hours yesterday afternoon and resolved with OTC otic drops.  Otic exam today is not concerning for infection.  She denies pain currently.  No further treatment is indicated at this time. -No indication for antibiotics at this time.  She was instructed to return to care if symptoms worsen or fail to improve.  Otherwise, she is scheduled for routine follow-up with her PCP in January.

## 2022-12-15 NOTE — Patient Instructions (Signed)
It was a pleasure to see you today.  Thank you for giving [place] the opportunity to be involved in your care.  Below is a brief recap of your visit and next steps.  We will plan to see you again in January.  Summary No signs of infection in the right ear Follow up with Dr. Allena Katz as scheduled in January

## 2022-12-16 ENCOUNTER — Encounter: Payer: Medicare PPO | Admitting: Podiatry

## 2022-12-21 ENCOUNTER — Telehealth: Payer: Self-pay

## 2022-12-21 ENCOUNTER — Other Ambulatory Visit: Payer: Self-pay | Admitting: Podiatry

## 2022-12-21 DIAGNOSIS — L600 Ingrowing nail: Secondary | ICD-10-CM | POA: Diagnosis not present

## 2022-12-21 MED ORDER — OXYCODONE-ACETAMINOPHEN 5-325 MG PO TABS: 1.0000 | ORAL_TABLET | ORAL | 0 refills | Status: DC | PRN

## 2022-12-21 NOTE — Telephone Encounter (Signed)
Patient called in severe pain on the left foot - Advised ok to take her normal dose of Aleve as instructed by Dr. Allena Katz after surgery this morning. Right toe is fine - left toe is throbbing in pain - advised to elevate and ice behind the knee. Also advised she could loosen (not remove) her dressing to see if that helps. If she is not feeling better she will call me back thanks

## 2022-12-23 ENCOUNTER — Encounter: Payer: Medicare PPO | Admitting: Podiatry

## 2022-12-23 DIAGNOSIS — F4310 Post-traumatic stress disorder, unspecified: Secondary | ICD-10-CM | POA: Diagnosis not present

## 2022-12-23 DIAGNOSIS — F429 Obsessive-compulsive disorder, unspecified: Secondary | ICD-10-CM | POA: Diagnosis not present

## 2022-12-23 DIAGNOSIS — F319 Bipolar disorder, unspecified: Secondary | ICD-10-CM | POA: Diagnosis not present

## 2022-12-28 DIAGNOSIS — F4312 Post-traumatic stress disorder, chronic: Secondary | ICD-10-CM | POA: Diagnosis not present

## 2022-12-30 ENCOUNTER — Ambulatory Visit (INDEPENDENT_AMBULATORY_CARE_PROVIDER_SITE_OTHER): Payer: Medicare PPO | Admitting: Podiatry

## 2022-12-30 DIAGNOSIS — L600 Ingrowing nail: Secondary | ICD-10-CM

## 2022-12-30 NOTE — Progress Notes (Signed)
Subjective:  Patient ID: Jennifer [name], female    DOB: 06-Dec-1965,  MRN: [id number]  Chief Complaint  Patient presents with   Routine Post Op    She reports she is doing well and her bandage fell off. She only took a few of her pain medications    DOS: 12/21/2022 Procedure: Bilateral hallux medial Winograd procedure  57 y.o. female returns for post-op check.  Patient states that she is doing well minimal complaints some pain.  Bandages clean dry and intact  Review of Systems: Negative except as noted in the HPI. Denies N/V/F/Ch.  Past Medical History:  Diagnosis Date   Abdominal pain, epigastric 10/07/2018   Acute non-recurrent maxillary sinusitis 11/07/2019   Anxiety    Arthritis    Binge-eating and purging type anorexia nervosa    Bipolar affective disorder (HCC) 12/04/2015   Bipolar affective disorder, current episode manic without psychotic symptoms (HCC) 12/04/2015   Bipolar disorder (HCC)    Cellulitis and abscess of buttock 02/15/2013   Cystitis, interstitial    Depression    Depression    Phreesia 01/28/2020   Dysrhythmia    Eczema    Emphysema lung (HCC)    Esophageal polyp    about 20 years ago   GERD (gastroesophageal reflux disease)    Hypertension    Left wrist pain 03/22/2019   OCD (obsessive compulsive disorder)    Palpitations 05/25/2014   Rectal pain 12/06/2018   Recurrent upper respiratory infection (URI)    Rib pain on right side 01/31/2019   Rosacea 03/27/2019   Sprain of temporomandibular joint or ligament 01/10/2019   Tobacco abuse 12/17/2021    Current Outpatient Medications:    albuterol (VENTOLIN HFA) 108 (90 Base) MCG/ACT inhaler, Inhale 2 puffs into the lungs every 6 (six) hours as needed for wheezing or shortness of breath., Disp: 18 g, Rfl: 5   Brexpiprazole ([name]) 4 MG TABS, Take 1 tablet (4 mg total) by mouth in the morning., Disp: 30 tablet, Rfl: 11   clonazePAM (KLONOPIN) 0.5 MG tablet, Take 1 tablet (0.5 mg total) by mouth 3  (three) times daily as needed for anxiety., Disp: 30 tablet, Rfl: 1   dicyclomine (BENTYL) 20 MG tablet, Take 1 tablet (20 mg total) by mouth 3 (three) times daily as needed for spasms (Diarrhea)., Disp: 30 tablet, Rfl: 1   esomeprazole (NEXIUM) 20 MG capsule, Take 1 capsule (20 mg total) by mouth daily., Disp: 30 capsule, Rfl: 5   Famotidine-Ca Carb-Mag Hydrox (PEPCID COMPLETE PO), Take 1 tablet by mouth in the morning., Disp: , Rfl:    fluticasone (FLONASE) 50 MCG/ACT nasal spray, Place 2 sprays into both nostrils daily., Disp: 16 g, Rfl: 6   fluvoxaMINE (LUVOX) 100 MG tablet, Take 2 tablets (200 mg total) by mouth 2 (two) times daily., Disp: 360 tablet, Rfl: 3   gabapentin (NEURONTIN) 600 MG tablet, 2 p.o. every morning, 1 p.o. q. afternoon, 2 p.o. nightly., Disp: 150 tablet, Rfl: 11   hydrOXYzine (ATARAX) 25 MG tablet, Take 1-2 tablets (25-50 mg total) by mouth every 8 (eight) hours as needed. (Patient taking differently: Take 12.5 mg by mouth at bedtime as needed (sleep).), Disp: 90 tablet, Rfl: 11   loratadine (CLARITIN) 10 MG tablet, Take 10 mg by mouth in the morning and at bedtime. Morning & afternoon., Disp: , Rfl:    mirtazapine (REMERON) 15 MG tablet, Take 1 tablet (15 mg total) by mouth daily as needed (tremors)., Disp: 90 tablet, Rfl: 3  naproxen sodium (ALEVE) 220 MG tablet, Take 660 mg by mouth daily as needed (migraine)., Disp: , Rfl:    ondansetron (ZOFRAN-ODT) 4 MG disintegrating tablet, Take 1 tablet (4 mg total) by mouth every 8 (eight) hours as needed for nausea or vomiting., Disp: 30 tablet, Rfl: 0   Probiotic Product (DAILY PROBIOTIC PO), Take 2 capsules by mouth daily with lunch., Disp: , Rfl:    RESTASIS 0.05 % ophthalmic emulsion, Place 1 drop into both eyes 2 (two) times daily., Disp: , Rfl:    sucralfate (CARAFATE) 1 g tablet, Take 1 g slurry around meals and at bedtime as needed for nausea and epigastric pain., Disp: 120 tablet, Rfl: 3   traZODone (DESYREL) 100 MG  tablet, Take 1-2 tablets (100-200 mg total) by mouth at bedtime as needed for sleep., Disp: 60 tablet, Rfl: 11   Ubrogepant (UBRELVY) 100 MG TABS, Take 100 mg by mouth with or without food. If needed, a second dose may be taken at least 2 hours after the initial dose. The maximum dose in a 24-hour period is 200 mg., Disp: 10 tablet, Rfl: 11   valACYclovir (VALTREX) 500 MG tablet, Take 500 mg by mouth daily at 12 noon., Disp: , Rfl:   Social History   Tobacco Use  Smoking Status Former   Current packs/day: 0.00   Average packs/day: 1 pack/day for 33.8 years (33.8 ttl pk-yrs)   Types: Cigarettes   Start date: 01/20/1988   Quit date: 11/12/2021   Years since quitting: 1.1  Smokeless Tobacco Never    Allergies  Allergen Reactions   Codeine Shortness Of Breath, Nausea Only and Rash   Penicillins Hives, Rash, Shortness Of Breath and Swelling    Has patient had a PCN reaction causing immediate rash, facial/tongue/throat swelling, SOB or lightheadedness with hypotension: yes  Has patient had a PCN reaction causing severe rash involving mucus membranes or skin necrosis: no  Has patient had a PCN reaction that required hospitalization: no  Has patient had a PCN reaction occurring within the last 10 years: no  If all of the above answers are "NO", then may proceed with Cephalosporin use.;  Has patient had a PCN reaction causing immediate rash, facial/tongue/throat swelling, SOB or lightheadedness with hypotension: yes, Has patient had a PCN reaction causing severe rash involving mucus membranes or skin necrosis: no, Has patient had a PCN reaction that required hospitalization: no, Has patient had a PCN reaction occurring within the last 10 years: no, If all of the above answers are "NO", then may proceed with Cephalosporin use.;   Depakote [Divalproex Sodium] Hives, Itching and Rash   Mobic [Meloxicam] Nausea And Vomiting and Other (See Comments)    Possible chest tightness - instructed by MD  not to take   Sonata [Zaleplon] Other (See Comments)    Hallucinations   Topamax [Topiramate] Other (See Comments)    Low BP and dizziness   Aciphex [Rabeprazole] Swelling   Aimovig [Erenumab-Aooe] Itching   Emgality [Galcanezumab-Gnlm] Hives and Rash   Objective:  There were no vitals filed for this visit. There is no height or weight on file to calculate BMI. Constitutional Well developed. Well nourished.  Vascular Foot warm and well perfused. Capillary refill normal to all digits.   Neurologic Normal speech. Oriented to person, place, and time. Epicritic sensation to light touch grossly present bilaterally.  Dermatologic Skin healing well without signs of infection. Skin edges well coapted without signs of infection.  Orthopedic: Tenderness to palpation noted about the  surgical site.   Radiographs: None Assessment:   1. Ingrown left big toenail   2. Ingrown toenail of right foot    Plan:  Patient was evaluated and treated and all questions answered.  S/p foot surgery bilaterally -Progressing as expected post-operatively. -XR: None -WB Status: Weightbearing as tolerated in surgical shoe -Sutures: Intact.  No clear signs of dehiscence noted no complication noted. -Medications: None -Foot redressed.  No follow-ups on file.

## 2023-01-06 DIAGNOSIS — F429 Obsessive-compulsive disorder, unspecified: Secondary | ICD-10-CM | POA: Diagnosis not present

## 2023-01-06 DIAGNOSIS — F319 Bipolar disorder, unspecified: Secondary | ICD-10-CM | POA: Diagnosis not present

## 2023-01-06 DIAGNOSIS — F4310 Post-traumatic stress disorder, unspecified: Secondary | ICD-10-CM | POA: Diagnosis not present

## 2023-01-08 ENCOUNTER — Ambulatory Visit (INDEPENDENT_AMBULATORY_CARE_PROVIDER_SITE_OTHER): Payer: Medicare PPO | Admitting: Podiatry

## 2023-01-08 DIAGNOSIS — L600 Ingrowing nail: Secondary | ICD-10-CM

## 2023-01-08 NOTE — Progress Notes (Signed)
Subjective:  Patient ID: Jennifer [name], female    DOB: Jan 09, 1966,  MRN: [id number]  No chief complaint on file.   DOS: 12/21/2022 Procedure: Bilateral hallux medial Winograd procedure  57 y.o. female returns for post-op check.  Patient states that she is doing well minimal complaints some pain.  Bandages clean dry and intact  Review of Systems: Negative except as noted in the HPI. Denies N/V/F/Ch.  Past Medical History:  Diagnosis Date   Abdominal pain, epigastric 10/07/2018   Acute non-recurrent maxillary sinusitis 11/07/2019   Anxiety    Arthritis    Binge-eating and purging type anorexia nervosa    Bipolar affective disorder (HCC) 12/04/2015   Bipolar affective disorder, current episode manic without psychotic symptoms (HCC) 12/04/2015   Bipolar disorder (HCC)    Cellulitis and abscess of buttock 02/15/2013   Cystitis, interstitial    Depression    Depression    Phreesia 01/28/2020   Dysrhythmia    Eczema    Emphysema lung (HCC)    Esophageal polyp    about 20 years ago   GERD (gastroesophageal reflux disease)    Hypertension    Left wrist pain 03/22/2019   OCD (obsessive compulsive disorder)    Palpitations 05/25/2014   Rectal pain 12/06/2018   Recurrent upper respiratory infection (URI)    Rib pain on right side 01/31/2019   Rosacea 03/27/2019   Sprain of temporomandibular joint or ligament 01/10/2019   Tobacco abuse 12/17/2021    Current Outpatient Medications:    albuterol (VENTOLIN HFA) 108 (90 Base) MCG/ACT inhaler, Inhale 2 puffs into the lungs every 6 (six) hours as needed for wheezing or shortness of breath., Disp: 18 g, Rfl: 5   Brexpiprazole ([name]) 4 MG TABS, Take 1 tablet (4 mg total) by mouth in the morning., Disp: 30 tablet, Rfl: 11   clonazePAM (KLONOPIN) 0.5 MG tablet, Take 1 tablet (0.5 mg total) by mouth 3 (three) times daily as needed for anxiety., Disp: 30 tablet, Rfl: 1   dicyclomine (BENTYL) 20 MG tablet, Take 1 tablet (20 mg total) by  mouth 3 (three) times daily as needed for spasms (Diarrhea)., Disp: 30 tablet, Rfl: 1   esomeprazole (NEXIUM) 20 MG capsule, Take 1 capsule (20 mg total) by mouth daily., Disp: 30 capsule, Rfl: 5   Famotidine-Ca Carb-Mag Hydrox (PEPCID COMPLETE PO), Take 1 tablet by mouth in the morning., Disp: , Rfl:    fluticasone (FLONASE) 50 MCG/ACT nasal spray, Place 2 sprays into both nostrils daily., Disp: 16 g, Rfl: 6   fluvoxaMINE (LUVOX) 100 MG tablet, Take 2 tablets (200 mg total) by mouth 2 (two) times daily., Disp: 360 tablet, Rfl: 3   gabapentin (NEURONTIN) 600 MG tablet, 2 p.o. every morning, 1 p.o. q. afternoon, 2 p.o. nightly., Disp: 150 tablet, Rfl: 11   hydrOXYzine (ATARAX) 25 MG tablet, Take 1-2 tablets (25-50 mg total) by mouth every 8 (eight) hours as needed. (Patient taking differently: Take 12.5 mg by mouth at bedtime as needed (sleep).), Disp: 90 tablet, Rfl: 11   loratadine (CLARITIN) 10 MG tablet, Take 10 mg by mouth in the morning and at bedtime. Morning & afternoon., Disp: , Rfl:    mirtazapine (REMERON) 15 MG tablet, Take 1 tablet (15 mg total) by mouth daily as needed (tremors)., Disp: 90 tablet, Rfl: 3   naproxen sodium (ALEVE) 220 MG tablet, Take 660 mg by mouth daily as needed (migraine)., Disp: , Rfl:    ondansetron (ZOFRAN-ODT) 4 MG disintegrating tablet, Take 1 tablet (  4 mg total) by mouth every 8 (eight) hours as needed for nausea or vomiting., Disp: 30 tablet, Rfl: 0   Probiotic Product (DAILY PROBIOTIC PO), Take 2 capsules by mouth daily with lunch., Disp: , Rfl:    RESTASIS 0.05 % ophthalmic emulsion, Place 1 drop into both eyes 2 (two) times daily., Disp: , Rfl:    sucralfate (CARAFATE) 1 g tablet, Take 1 g slurry around meals and at bedtime as needed for nausea and epigastric pain., Disp: 120 tablet, Rfl: 3   traZODone (DESYREL) 100 MG tablet, Take 1-2 tablets (100-200 mg total) by mouth at bedtime as needed for sleep., Disp: 60 tablet, Rfl: 11   Ubrogepant (UBRELVY) 100 MG  TABS, Take 100 mg by mouth with or without food. If needed, a second dose may be taken at least 2 hours after the initial dose. The maximum dose in a 24-hour period is 200 mg., Disp: 10 tablet, Rfl: 11   valACYclovir (VALTREX) 500 MG tablet, Take 500 mg by mouth daily at 12 noon., Disp: , Rfl:   Social History   Tobacco Use  Smoking Status Former   Current packs/day: 0.00   Average packs/day: 1 pack/day for 33.8 years (33.8 ttl pk-yrs)   Types: Cigarettes   Start date: 01/20/1988   Quit date: 11/12/2021   Years since quitting: 1.1  Smokeless Tobacco Never    Allergies  Allergen Reactions   Codeine Shortness Of Breath, Nausea Only and Rash   Penicillins Hives, Rash, Shortness Of Breath and Swelling    Has patient had a PCN reaction causing immediate rash, facial/tongue/throat swelling, SOB or lightheadedness with hypotension: yes  Has patient had a PCN reaction causing severe rash involving mucus membranes or skin necrosis: no  Has patient had a PCN reaction that required hospitalization: no  Has patient had a PCN reaction occurring within the last 10 years: no  If all of the above answers are "NO", then may proceed with Cephalosporin use.;  Has patient had a PCN reaction causing immediate rash, facial/tongue/throat swelling, SOB or lightheadedness with hypotension: yes, Has patient had a PCN reaction causing severe rash involving mucus membranes or skin necrosis: no, Has patient had a PCN reaction that required hospitalization: no, Has patient had a PCN reaction occurring within the last 10 years: no, If all of the above answers are "NO", then may proceed with Cephalosporin use.;   Depakote [Divalproex Sodium] Hives, Itching and Rash   Mobic [Meloxicam] Nausea And Vomiting and Other (See Comments)    Possible chest tightness - instructed by MD not to take   Sonata [Zaleplon] Other (See Comments)    Hallucinations   Topamax [Topiramate] Other (See Comments)    Low BP and dizziness    Aciphex [Rabeprazole] Swelling   Aimovig [Erenumab-Aooe] Itching   Emgality [Galcanezumab-Gnlm] Hives and Rash   Objective:  There were no vitals filed for this visit. There is no height or weight on file to calculate BMI. Constitutional Well developed. Well nourished.  Vascular Foot warm and well perfused. Capillary refill normal to all digits.   Neurologic Normal speech. Oriented to person, place, and time. Epicritic sensation to light touch grossly present bilaterally.  Dermatologic Skin completely epithelialized.  No signs of Deis is no complication noted.  Ingrown sites of right hallux.  No signs recurrence noted.  Orthopedic: Mild tenderness to palpation noted about the surgical site.   Radiographs: None Assessment:   No diagnosis found.  Plan:  Patient was evaluated and treated and  all questions answered.  S/p foot surgery bilaterally -Clinically healed and is healed well.  If any acute issues happen in the future she will come back and see me.  At this time patient can return to regular activities no restrictions  No follow-ups on file.

## 2023-01-21 DIAGNOSIS — F319 Bipolar disorder, unspecified: Secondary | ICD-10-CM | POA: Diagnosis not present

## 2023-01-21 DIAGNOSIS — F429 Obsessive-compulsive disorder, unspecified: Secondary | ICD-10-CM | POA: Diagnosis not present

## 2023-01-21 DIAGNOSIS — F4310 Post-traumatic stress disorder, unspecified: Secondary | ICD-10-CM | POA: Diagnosis not present

## 2023-01-22 ENCOUNTER — Telehealth: Payer: Medicare PPO | Admitting: Physician Assistant

## 2023-01-22 ENCOUNTER — Encounter: Payer: Self-pay | Admitting: Physician Assistant

## 2023-01-22 DIAGNOSIS — F429 Obsessive-compulsive disorder, unspecified: Secondary | ICD-10-CM

## 2023-01-22 DIAGNOSIS — Z87891 Personal history of nicotine dependence: Secondary | ICD-10-CM | POA: Diagnosis not present

## 2023-01-22 DIAGNOSIS — G4700 Insomnia, unspecified: Secondary | ICD-10-CM

## 2023-01-22 DIAGNOSIS — F5023 Bulimia nervosa, severe: Secondary | ICD-10-CM | POA: Diagnosis not present

## 2023-01-22 DIAGNOSIS — F319 Bipolar disorder, unspecified: Secondary | ICD-10-CM

## 2023-01-22 MED ORDER — CLOMIPRAMINE HCL 25 MG PO CAPS: 25.0000 mg | ORAL_CAPSULE | Freq: Every day | ORAL | 1 refills | Status: DC

## 2023-01-22 MED ORDER — GABAPENTIN 600 MG PO TABS: ORAL_TABLET | ORAL | 11 refills | Status: DC

## 2023-01-22 MED ORDER — CLONAZEPAM 0.5 MG PO TABS: 0.5000 mg | ORAL_TABLET | Freq: Three times a day (TID) | ORAL | 1 refills | Status: DC | PRN

## 2023-01-22 NOTE — Progress Notes (Signed)
 [facility]  Patient ID: Jennifer [name],  MRN: [id number]  PCP: Tobie Suzzane POUR, MD  Date of Evaluation: 01/22/2023 time spent:20 minutes  Chief Complaint:  Chief Complaint   Anxiety; Depression; Follow-up    Virtual Visit via Telehealth  I connected with patient by a video enabled telemedicine application, with their informed consent, and verified patient privacy and that I am speaking with the correct person using two identifiers.  I am private, in my office and the patient is at home.  I discussed the limitations, risks, security and privacy concerns of performing an evaluation and management service by video and the availability of in person appointments. I also discussed with the patient that there may be a patient responsible charge related to this service. The patient expressed understanding and agreed to proceed.   I discussed the assessment and treatment plan with the patient. The patient was provided an opportunity to ask questions and all were answered. The patient agreed with the plan and demonstrated an understanding of the instructions.   The patient was advised to call back or seek an in-person evaluation if the symptoms worsen or if the condition fails to improve as anticipated.  I provided 20 minutes of non-face-to-face time during this encounter.  HISTORY/CURRENT STATUS: HPI for routine follow-up.  For the past several weeks she has been having more issues with OCD.  States she has a strong feeling that something bad will happen if she does not do something, specifically that her dog would die if she did not do such and such.  There was even a point where she heard something in her head telling her that the water  she was giving her dog would poison him.  She was so upset that she drank some of the same water  before she reported in his bowl.  That way if he died, I would die too.  She has discussed this with her therapist, they have worked on it some and  the OCD is a little better.  She is not having panic attacks but gets overwhelmed in general.  Patient is able to enjoy things. Christmas was good.   Energy and motivation are good.   No extreme sadness, tearfulness, or feelings of hopelessness.  Sleeps ok. ADLs and personal hygiene are normal.   Denies any changes in concentration, making decisions, or remembering things.  She still binges and purges but it is not as bad as it had been.  She is still in counseling for the eating disorder as well.  Denies suicidal or homicidal thoughts.  Denies dizziness, syncope, seizures, numbness, tingling, tremor, tics, unsteady gait, slurred speech, confusion.  No akathisia.  The mirtazapine  has really helped that.  Has chronic joint pain. Denies unexplained weight loss, frequent infections, or sores that heal slowly.  No polyphagia, polydipsia, or polyuria. Denies visual changes or paresthesias.   Individual Medical History/ Review of Systems: Changes? :No        Past medications for mental health diagnoses include: Risperdal, Seroquel, Prozac, Zoloft,  Wellbutrin, Lamictal , Depakote  caused hair loss, Xanax , Ambien , trazodone , Trileptal, Luvox , Topamax, Elavil, Pamelor, BuSpar, doxazosin, prazosin, lithium, Vraylar , Abilify  caused increased appetite, caused weight gain which is contraindicated w/ her eating d/o, and didn't work for sx, [name] , Latuda  >60 mg caused abd pain and nausea, propranolol  caused diarrhea.  Allergies: Codeine, Penicillins, Depakote  [divalproex  sodium], Mobic [meloxicam], Sonata [zaleplon], Topamax [topiramate], Aciphex  [rabeprazole ], Aimovig  [erenumab -aooe], and Emgality [galcanezumab-gnlm]  Current Medications:  Current Outpatient Medications:  albuterol  (VENTOLIN  HFA) 108 (90 Base) MCG/ACT inhaler, Inhale 2 puffs into the lungs every 6 (six) hours as needed for wheezing or shortness of breath., Disp: 18 g, Rfl: 5   Brexpiprazole  ([name] ) 4 MG TABS, Take 1 tablet (4 mg total) by  mouth in the morning., Disp: 30 tablet, Rfl: 11   clomiPRAMINE  (ANAFRANIL ) 25 MG capsule, Take 1 capsule (25 mg total) by mouth at bedtime., Disp: 30 capsule, Rfl: 1   dicyclomine  (BENTYL ) 20 MG tablet, Take 1 tablet (20 mg total) by mouth 3 (three) times daily as needed for spasms (Diarrhea)., Disp: 30 tablet, Rfl: 1   esomeprazole  (NEXIUM ) 20 MG capsule, Take 1 capsule (20 mg total) by mouth daily., Disp: 30 capsule, Rfl: 5   Famotidine -Ca Carb-Mag Hydrox (PEPCID  COMPLETE PO), Take 1 tablet by mouth in the morning., Disp: , Rfl:    fluticasone  (FLONASE ) 50 MCG/ACT nasal spray, Place 2 sprays into both nostrils daily., Disp: 16 g, Rfl: 6   fluvoxaMINE  (LUVOX ) 100 MG tablet, Take 2 tablets (200 mg total) by mouth 2 (two) times daily., Disp: 360 tablet, Rfl: 3   hydrOXYzine  (ATARAX ) 25 MG tablet, Take 1-2 tablets (25-50 mg total) by mouth every 8 (eight) hours as needed. (Patient taking differently: Take 12.5 mg by mouth at bedtime as needed (sleep).), Disp: 90 tablet, Rfl: 11   loratadine (CLARITIN) 10 MG tablet, Take 10 mg by mouth in the morning and at bedtime. Morning & afternoon., Disp: , Rfl:    mirtazapine  (REMERON ) 15 MG tablet, Take 1 tablet (15 mg total) by mouth daily as needed (tremors)., Disp: 90 tablet, Rfl: 3   naproxen  sodium (ALEVE ) 220 MG tablet, Take 660 mg by mouth daily as needed (migraine)., Disp: , Rfl:    ondansetron  (ZOFRAN -ODT) 4 MG disintegrating tablet, Take 1 tablet (4 mg total) by mouth every 8 (eight) hours as needed for nausea or vomiting., Disp: 30 tablet, Rfl: 0   Probiotic Product (DAILY PROBIOTIC PO), Take 2 capsules by mouth daily with lunch., Disp: , Rfl:    RESTASIS  0.05 % ophthalmic emulsion, Place 1 drop into both eyes 2 (two) times daily., Disp: , Rfl:    sucralfate  (CARAFATE ) 1 g tablet, Take 1 g slurry around meals and at bedtime as needed for nausea and epigastric pain., Disp: 120 tablet, Rfl: 3   traZODone  (DESYREL ) 100 MG tablet, Take 1-2 tablets (100-200  mg total) by mouth at bedtime as needed for sleep., Disp: 60 tablet, Rfl: 11   Ubrogepant  (UBRELVY ) 100 MG TABS, Take 100 mg by mouth with or without food. If needed, a second dose may be taken at least 2 hours after the initial dose. The maximum dose in a 24-hour period is 200 mg., Disp: 10 tablet, Rfl: 11   valACYclovir  (VALTREX ) 500 MG tablet, Take 500 mg by mouth daily at 12 noon., Disp: , Rfl:    clonazePAM  (KLONOPIN ) 0.5 MG tablet, Take 1 tablet (0.5 mg total) by mouth 3 (three) times daily as needed for anxiety., Disp: 30 tablet, Rfl: 1   gabapentin  (NEURONTIN ) 600 MG tablet, 2 p.o. every morning, 1 p.o. q. afternoon, 2 p.o. nightly., Disp: 150 tablet, Rfl: 11 Medication Side Effects: none  Family Medical/ Social History: Changes?  No  MENTAL HEALTH EXAM:  Last menstrual period 10/07/2011.There is no height or weight on file to calculate BMI.  General Appearance: Casual and Well Groomed  Eye Contact:  Good  Speech:  Clear and Coherent and Normal Rate  Volume:  Normal  Mood:  Euthymic  Affect:  Congruent  Thought Process:  Goal Directed and Descriptions of Associations: Circumstantial  Orientation:  Full (Time, Place, and Person)  Thought Content: Logical   Suicidal Thoughts:  No  Homicidal Thoughts:  No  Memory:  WNL  Judgement:  Good  Insight:  Good  Psychomotor Activity:  Normal  Concentration:  Concentration: Good and Attention Span: Good  Recall:  Good  Fund of Knowledge: Good  Language: Good  Assets:  Communication Skills Desire for Improvement Financial Resources/Insurance Housing Resilience Transportation  ADL's:  Intact  Cognition: WNL  Prognosis:  Good   DIAGNOSES:    ICD-10-CM   1. Obsessive-compulsive disorder, unspecified type  F42.9     2. Bipolar I disorder (HCC)  F31.9     3. Severe bulimia nervosa  F50.23     4. Insomnia, unspecified type  G47.00     5. Former smoker  Z87.891      Receiving Psychotherapy: Yes  Lauraine Jester, LCMSW at [facility], [facility] Sciara for brain spotting  RECOMMENDATIONS:  PDMP was reviewed.  See code on 12/21/2022.  Last Klonopin  09/03/2022.  Gabapentin  known to me.   I provided 20 minutes of non-face-to-face time during this encounter, including time spent before and after the visit in records review, medical decision making, counseling pertinent to today's visit, and charting.   We discussed the OCD.  I recommend adding clomipramine .  She took it for a long time in the past but then had abnormal LFTs, the Anafranil  was blamed for that.  States the clomipramine  helped more than anything else, up to that point.  It saved my life.SABRA  LFTs in July were normal.  See labs on chart.  I recommend adding it to the Luvox .  But will start with the lowest dose since she is on 400 mg of Luvox .  She knows to contact me if she has any symptoms of serotonin syndrome.  No other changes will be made.  Continue [name]  4 mg, 1 p.o. every morning. Restart clomipramine  25 mg, 1 p.o. nightly. Continue Klonopin  0.5 mg, 1 p.o. twice daily prn. Continue Luvox  100 mg, 2 p.o. twice daily.   Continue gabapentin  600 mg, 2 qam.,  3 p.o. nightly.  Continue hydroxyzine  25-50 mg, 1 p.o. 3 times daily as needed. Continue melatonin nightly as needed sleep. Continue mirtazapine  15 mg, 1 p.o. nightly as needed tremors/akathisia. Continue pramipexole  0.5 mg nightly.  Prn. Per another provider. Continue trazodone  100 mg, 1-2 nightly as needed sleep. Continue therapy. Return in 6 weeks.  Verneita Cooks, PA-C

## 2023-02-02 ENCOUNTER — Telehealth: Payer: Self-pay | Admitting: Physician Assistant

## 2023-02-02 NOTE — Telephone Encounter (Signed)
 Pt lvm that she is crying all;the time. This started right after she started taking the new medication teresa prescribed. Please call her and help her at [phone]

## 2023-02-02 NOTE — Telephone Encounter (Signed)
 Patient was seen on 1/3 and restarted on clomipramine . She had reported success with this before. She didn't start it until 1/6 because pharmacy had to order it. She is reporting crying spells every afternoon, not in the AM or PM. She said she really wants it to work. Asked if any new stressors and she said her husband underwent testing yesterday for possible prostate cancer. She reports being up and down all night, but said this is her norm. Did not report any other sx. She feels like there must be an interaction with another medication.   [facility]

## 2023-02-02 NOTE — Telephone Encounter (Signed)
 VM left with info per DPR.

## 2023-02-02 NOTE — Telephone Encounter (Signed)
 I don't think the medicine is causing it. I think the concern for her husband, the holidays, and stress from all that, are more than likely causing more sadness right now. I recommend she continue taking it and I think she'll notice improvement in 3-4 weeks. Let her know I hope her husband's results come back normal.

## 2023-02-03 DIAGNOSIS — F319 Bipolar disorder, unspecified: Secondary | ICD-10-CM | POA: Diagnosis not present

## 2023-02-03 DIAGNOSIS — F4310 Post-traumatic stress disorder, unspecified: Secondary | ICD-10-CM | POA: Diagnosis not present

## 2023-02-03 DIAGNOSIS — F429 Obsessive-compulsive disorder, unspecified: Secondary | ICD-10-CM | POA: Diagnosis not present

## 2023-02-05 ENCOUNTER — Ambulatory Visit: Payer: Medicare PPO | Admitting: Allergy & Immunology

## 2023-02-08 DIAGNOSIS — F4312 Post-traumatic stress disorder, chronic: Secondary | ICD-10-CM | POA: Diagnosis not present

## 2023-02-09 ENCOUNTER — Ambulatory Visit: Payer: Medicare PPO | Admitting: Internal Medicine

## 2023-02-09 ENCOUNTER — Encounter: Payer: Self-pay | Admitting: Internal Medicine

## 2023-02-09 VITALS — BP 112/73 | HR 71 | Ht 64.0 in | Wt 179.4 lb

## 2023-02-09 DIAGNOSIS — F3160 Bipolar disorder, current episode mixed, unspecified: Secondary | ICD-10-CM | POA: Diagnosis not present

## 2023-02-09 DIAGNOSIS — J432 Centrilobular emphysema: Secondary | ICD-10-CM | POA: Diagnosis not present

## 2023-02-09 DIAGNOSIS — L989 Disorder of the skin and subcutaneous tissue, unspecified: Secondary | ICD-10-CM

## 2023-02-09 DIAGNOSIS — F502 Bulimia nervosa, unspecified: Secondary | ICD-10-CM

## 2023-02-09 DIAGNOSIS — R7303 Prediabetes: Secondary | ICD-10-CM

## 2023-02-09 DIAGNOSIS — Z122 Encounter for screening for malignant neoplasm of respiratory organs: Secondary | ICD-10-CM

## 2023-02-09 MED ORDER — CLOTRIMAZOLE-BETAMETHASONE 1-0.05 % EX CREA: 1.0000 | TOPICAL_CREAM | Freq: Every day | CUTANEOUS | 0 refills | Status: DC

## 2023-02-09 NOTE — Assessment & Plan Note (Signed)
Needs to avoid purging to avoid dehydration Followed by psychiatry On [name], Luvox, gabapentin and Remeron for bipolar disorder On Klonopin and Atarax as needed for anxiety

## 2023-02-09 NOTE — Patient Instructions (Signed)
Please use Lotrisone cream as prescribed in perineal area.  Please perform sitz bath as advised.  Please contact Ob. Gyn. With persistent symptoms.  Please continue to take medications as prescribed.  Please continue to follow low carb diet and perform moderate exercise/walking at least 150 mins/week.
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
## 2023-02-09 NOTE — Assessment & Plan Note (Signed)
Followed by psychiatry On [name], Luvox, gabapentin and Remeron/Trazodone On Atarax as needed

## 2023-02-09 NOTE — Assessment & Plan Note (Signed)
Lab Results  Component Value Date   HGBA1C 5.6 08/04/2022   Advised to follow low carb diet for now

## 2023-02-09 NOTE — Assessment & Plan Note (Signed)
Mild wheezing, especially in the mornings Lung sounds benign today Has albuterol inhaler as needed for dyspnea or wheezing Needs to abstain from smoking, she agrees - quit in 2023

## 2023-02-10 DIAGNOSIS — F319 Bipolar disorder, unspecified: Secondary | ICD-10-CM | POA: Diagnosis not present

## 2023-02-10 DIAGNOSIS — F429 Obsessive-compulsive disorder, unspecified: Secondary | ICD-10-CM | POA: Diagnosis not present

## 2023-02-10 DIAGNOSIS — F4310 Post-traumatic stress disorder, unspecified: Secondary | ICD-10-CM | POA: Diagnosis not present

## 2023-02-12 DIAGNOSIS — Z122 Encounter for screening for malignant neoplasm of respiratory organs: Secondary | ICD-10-CM | POA: Insufficient documentation

## 2023-02-12 DIAGNOSIS — L989 Disorder of the skin and subcutaneous tissue, unspecified: Secondary | ICD-10-CM | POA: Insufficient documentation

## 2023-02-12 NOTE — Assessment & Plan Note (Signed)
Quit smoking in 202,3, has > 20-pack-year smoking history Ordered low-dose CT chest after discussing with the patient.

## 2023-02-12 NOTE — Assessment & Plan Note (Signed)
Considering recurrent nature, likelihood of perineal fungal infection Started Lotrisone cream - advised to avoid mucosal exposure Keep area clean and dry If persistent, needs to be evaluated by OB/GYN

## 2023-02-17 DIAGNOSIS — F319 Bipolar disorder, unspecified: Secondary | ICD-10-CM | POA: Diagnosis not present

## 2023-02-17 DIAGNOSIS — F4310 Post-traumatic stress disorder, unspecified: Secondary | ICD-10-CM | POA: Diagnosis not present

## 2023-02-17 DIAGNOSIS — F429 Obsessive-compulsive disorder, unspecified: Secondary | ICD-10-CM | POA: Diagnosis not present

## 2023-02-18 DIAGNOSIS — F319 Bipolar disorder, unspecified: Secondary | ICD-10-CM | POA: Diagnosis not present

## 2023-02-18 DIAGNOSIS — F4310 Post-traumatic stress disorder, unspecified: Secondary | ICD-10-CM | POA: Diagnosis not present

## 2023-02-18 DIAGNOSIS — F429 Obsessive-compulsive disorder, unspecified: Secondary | ICD-10-CM | POA: Diagnosis not present

## 2023-02-24 DIAGNOSIS — F4310 Post-traumatic stress disorder, unspecified: Secondary | ICD-10-CM | POA: Diagnosis not present

## 2023-02-24 DIAGNOSIS — F319 Bipolar disorder, unspecified: Secondary | ICD-10-CM | POA: Diagnosis not present

## 2023-02-24 DIAGNOSIS — F429 Obsessive-compulsive disorder, unspecified: Secondary | ICD-10-CM | POA: Diagnosis not present

## 2023-03-04 ENCOUNTER — Telehealth (INDEPENDENT_AMBULATORY_CARE_PROVIDER_SITE_OTHER): Payer: Medicare PPO | Admitting: Physician Assistant

## 2023-03-04 ENCOUNTER — Encounter: Payer: Self-pay | Admitting: Physician Assistant

## 2023-03-04 DIAGNOSIS — F411 Generalized anxiety disorder: Secondary | ICD-10-CM | POA: Diagnosis not present

## 2023-03-04 DIAGNOSIS — F319 Bipolar disorder, unspecified: Secondary | ICD-10-CM

## 2023-03-04 DIAGNOSIS — Z87891 Personal history of nicotine dependence: Secondary | ICD-10-CM | POA: Diagnosis not present

## 2023-03-04 DIAGNOSIS — G2571 Drug induced akathisia: Secondary | ICD-10-CM

## 2023-03-04 DIAGNOSIS — F5105 Insomnia due to other mental disorder: Secondary | ICD-10-CM | POA: Diagnosis not present

## 2023-03-04 DIAGNOSIS — F429 Obsessive-compulsive disorder, unspecified: Secondary | ICD-10-CM

## 2023-03-04 DIAGNOSIS — F99 Mental disorder, not otherwise specified: Secondary | ICD-10-CM | POA: Diagnosis not present

## 2023-03-04 DIAGNOSIS — F5023 Bulimia nervosa, severe: Secondary | ICD-10-CM | POA: Diagnosis not present

## 2023-03-04 MED ORDER — MIRTAZAPINE 7.5 MG PO TABS
7.5000 mg | ORAL_TABLET | Freq: Every day | ORAL | 1 refills | Status: DC
Start: 2023-03-04 — End: 2023-04-26

## 2023-03-04 MED ORDER — CLOMIPRAMINE HCL 25 MG PO CAPS: 25.0000 mg | ORAL_CAPSULE | Freq: Every day | ORAL | 11 refills | Status: DC

## 2023-03-04 NOTE — Progress Notes (Signed)
[facility]  Patient ID: Jennifer [name],  MRN: [id number]  PCP: Anabel Halon, MD  Date of Evaluation: 03/04/2023 time spent:25 minutes  Chief Complaint:  Chief Complaint   Depression; Anxiety; Follow-up; Insomnia    Virtual Visit via Telehealth  I connected with patient by a video enabled telemedicine application, with their informed consent, and verified patient privacy and that I am speaking with the correct person using two identifiers.  I am private, in my office and the patient is at home.  I discussed the limitations, risks, security and privacy concerns of performing an evaluation and management service by video and the availability of in person appointments. I also discussed with the patient that there may be a patient responsible charge related to this service. The patient expressed understanding and agreed to proceed.   I discussed the assessment and treatment plan with the patient. The patient was provided an opportunity to ask questions and all were answered. The patient agreed with the plan and demonstrated an understanding of the instructions.   The patient was advised to call back or seek an in-person evaluation if the symptoms worsen or if the condition fails to improve as anticipated.  I provided 25 minutes of non-face-to-face time during this encounter.  HISTORY/CURRENT STATUS: HPI for follow-up after restarting clomipramine  At her appointment 6 weeks ago we restarted clomipramine for OCD.  States it has been helpful.  She is not obsessing or having compulsions nearly as bad as she did.  She is not checking things over and over and she is not worried that not performing an action would result in the death of her dog.  States she is having more symptoms of akathisia again.  Feels like she wants to crawl out of her skin sometimes.  She cannot sit still.  She is always shaking her leg when she sits down.  No tremor in hands or abnormal movements of her  face or tongue.  States she has had a couple of episodes since our last visit when she got really upset, anxious and "had a meltdown."  She feels like it was related to the akathisia.  She took Klonopin each time they occurred and within 30 to 40 minutes she was feeling better.  She has been taking the Klonopin more often the past month due to this and not so much for GAD.  Patient is able to enjoy things.  She still enjoys sewing and reading.  Energy and motivation are good.   No extreme sadness, tearfulness, or feelings of hopelessness.  Sleeps well most of the time.  She does take trazodone sometimes for insomnia though.  She took a mirtazapine on 1 occasion when the akathisia was worse that particular day.  It knocked her out and she felt groggy into the next day.  ADLs and personal hygiene are normal.   Denies any changes in concentration, making decisions, or remembering things.  Appetite has not changed.  Weight is stable.  She continues to binge and purge sometimes.  She has been talking with her counselor about this.  No laxative use.  No calorie restricting.  Denies suicidal or homicidal thoughts.  Patient denies increased energy with decreased need for sleep, increased talkativeness, racing thoughts, impulsivity or risky behaviors, increased spending, increased libido, grandiosity, increased irritability or anger, paranoia, or hallucinations.  Denies dizziness, syncope, seizures, numbness, tingling, tremor, tics, unsteady gait, slurred speech, confusion.  Has chronic joint pain. Denies unexplained weight loss, frequent infections, or  sores that heal slowly.  No polyphagia, polydipsia, or polyuria. Denies visual changes or paresthesias.   Individual Medical History/ Review of Systems: Changes? :No        Past medications for mental health diagnoses include: Risperdal, Seroquel, Prozac, Zoloft,  Wellbutrin, Lamictal, Depakote caused hair loss, Xanax, Ambien, trazodone, Trileptal, Luvox, Topamax,  Elavil, Pamelor, BuSpar, doxazosin, prazosin, lithium, Vraylar, Abilify caused increased appetite, caused weight gain which is contraindicated w/ her eating d/o, and didn't work for [employer], [name], Latuda >60 mg caused abd pain and nausea, propranolol caused diarrhea.  Allergies: Codeine, Penicillins, Depakote [divalproex sodium], Mobic [meloxicam], Sonata [zaleplon], Topamax [topiramate], Aciphex [rabeprazole], Aimovig [erenumab-aooe], and Emgality [galcanezumab-gnlm]  Current Medications:  Current Outpatient Medications:    albuterol (VENTOLIN HFA) 108 (90 Base) MCG/ACT inhaler, Inhale 2 puffs into the lungs every 6 (six) hours as needed for wheezing or shortness of breath., Disp: 18 g, Rfl: 5   Brexpiprazole ([name]) 4 MG TABS, Take 1 tablet (4 mg total) by mouth in the morning., Disp: 30 tablet, Rfl: 11   clonazePAM (KLONOPIN) 0.5 MG tablet, Take 1 tablet (0.5 mg total) by mouth 3 (three) times daily as needed for anxiety., Disp: 30 tablet, Rfl: 1   clotrimazole-betamethasone (LOTRISONE) cream, Apply 1 Application topically daily., Disp: 30 g, Rfl: 0   dicyclomine (BENTYL) 20 MG tablet, Take 1 tablet (20 mg total) by mouth 3 (three) times daily as needed for spasms (Diarrhea)., Disp: 30 tablet, Rfl: 1   esomeprazole (NEXIUM) 20 MG capsule, Take 1 capsule (20 mg total) by mouth daily., Disp: 30 capsule, Rfl: 5   Famotidine-Ca Carb-Mag Hydrox (PEPCID COMPLETE PO), Take 1 tablet by mouth in the morning., Disp: , Rfl:    fluticasone (FLONASE) 50 MCG/ACT nasal spray, Place 2 sprays into both nostrils daily., Disp: 16 g, Rfl: 6   fluvoxaMINE (LUVOX) 100 MG tablet, Take 2 tablets (200 mg total) by mouth 2 (two) times daily., Disp: 360 tablet, Rfl: 3   gabapentin (NEURONTIN) 600 MG tablet, 2 p.o. every morning, 1 p.o. q. afternoon, 2 p.o. nightly., Disp: 150 tablet, Rfl: 11   hydrOXYzine (ATARAX) 25 MG tablet, Take 1-2 tablets (25-50 mg total) by mouth every 8 (eight) hours as needed. (Patient taking  differently: Take 12.5 mg by mouth at bedtime as needed (sleep).), Disp: 90 tablet, Rfl: 11   loratadine (CLARITIN) 10 MG tablet, Take 10 mg by mouth in the morning and at bedtime. Morning & afternoon., Disp: , Rfl:    mirtazapine (REMERON) 7.5 MG tablet, Take 1 tablet (7.5 mg total) by mouth at bedtime., Disp: 30 tablet, Rfl: 1   naproxen sodium (ALEVE) 220 MG tablet, Take 660 mg by mouth daily as needed (migraine)., Disp: , Rfl:    ondansetron (ZOFRAN-ODT) 4 MG disintegrating tablet, Take 1 tablet (4 mg total) by mouth every 8 (eight) hours as needed for nausea or vomiting., Disp: 30 tablet, Rfl: 0   Probiotic Product (DAILY PROBIOTIC PO), Take 2 capsules by mouth daily with lunch., Disp: , Rfl:    RESTASIS 0.05 % ophthalmic emulsion, Place 1 drop into both eyes 2 (two) times daily., Disp: , Rfl:    sucralfate (CARAFATE) 1 g tablet, Take 1 g slurry around meals and at bedtime as needed for nausea and epigastric pain., Disp: 120 tablet, Rfl: 3   traZODone (DESYREL) 100 MG tablet, Take 1-2 tablets (100-200 mg total) by mouth at bedtime as needed for sleep., Disp: 60 tablet, Rfl: 11   Ubrogepant (UBRELVY) 100 MG TABS, Take  100 mg by mouth with or without food. If needed, a second dose may be taken at least 2 hours after the initial dose. The maximum dose in a 24-hour period is 200 mg., Disp: 10 tablet, Rfl: 11   valACYclovir (VALTREX) 500 MG tablet, Take 500 mg by mouth daily at 12 noon., Disp: , Rfl:    clomiPRAMINE (ANAFRANIL) 25 MG capsule, Take 1 capsule (25 mg total) by mouth at bedtime., Disp: 30 capsule, Rfl: 11 Medication Side Effects: none  Family Medical/ Social History: Changes?  No  MENTAL HEALTH EXAM:  Last menstrual period 10/07/2011.There is no height or weight on file to calculate BMI.  General Appearance: Casual and Well Groomed  Eye Contact:  Good  Speech:  Clear and Coherent and Normal Rate  Volume:  Normal  Mood:  Euthymic  Affect:  Congruent  Thought Process:  Goal  Directed and Descriptions of Associations: Circumstantial  Orientation:  Full (Time, Place, and Person)  Thought Content: Logical   Suicidal Thoughts:  No  Homicidal Thoughts:  No  Memory:  WNL  Judgement:  Good  Insight:  Good  Psychomotor Activity:  Normal  Concentration:  Concentration: Good and Attention Span: Good  Recall:  Good  Fund of Knowledge: Good  Language: Good  Assets:  Communication Skills Desire for Improvement Financial Resources/Insurance Housing Leisure Time Resilience Transportation  ADL's:  Intact  Cognition: WNL  Prognosis:  Good   DIAGNOSES:    ICD-10-CM   1. Obsessive-compulsive disorder, unspecified type  F42.9     2. Generalized anxiety disorder  F41.1     3. Bipolar I disorder (HCC)  F31.9     4. Insomnia due to other mental disorder  F51.05    F99     5. Severe bulimia nervosa  F50.23     6. Akathisia  G25.71     7. Former smoker  Z87.891       Receiving Psychotherapy: Yes  Dominic Pea, LCMSW at [facility], [place] [facility] for brain spotting  RECOMMENDATIONS:  PDMP was reviewed.  Gabapentin filled 02/05/2023.  Klonopin filled 01/22/2023. I provided 25 minutes of non-face-to-face time during this encounter, including time spent before and after the visit in records review, medical decision making, counseling pertinent to today's visit, and charting.   She has not been taking the mirtazapine routinely, I recommend she do so.  When she took the 15 mg pill recently she felt extremely groggy the next day.  I recommend decreasing the dose but she needs to take it every night now, not just as needed.  It is fine to take the Klonopin more often for the akathisia but hopefully after mirtazapine is on board and effective then she will not need the Klonopin as much.  She has responded well to the addition of clomipramine.  We will continue that and all other medications.  Continue [name] 4 mg, 1 p.o. every morning. Continue  clomipramine 25 mg, 1 p.o. nightly. Continue Klonopin 0.5 mg, 1 p.o. twice daily prn. Continue Luvox 100 mg, 2 p.o. twice daily.   Continue gabapentin 600 mg, 2 qam.,  1 p.o. q. afternoon, and 2 p.o. nightly.   Continue hydroxyzine 25-50 mg, 1 p.o. 3 times daily as needed. Decrease mirtazapine to 7.5 mg, 1 p.o. nightly routinely.   Hold trazodone for now. Continue therapy. Return in 6-8 weeks.  Melony Overly, PA-C

## 2023-03-05 ENCOUNTER — Telehealth: Payer: Self-pay | Admitting: Physician Assistant

## 2023-03-05 NOTE — Telephone Encounter (Signed)
Pt.notified

## 2023-03-05 NOTE — Telephone Encounter (Signed)
She didn't say anything about it yesterday at our appt, but if she's insistent on going off clomipramine, at this dose, she can stop it. Make sure she's seeing her counselor to discuss what she's going through. Go to [facility] if suicidal thoughts worsen or if she has a plan.

## 2023-03-05 NOTE — Telephone Encounter (Signed)
Jennifer [name] reports having a current break down ( currently crying on the phone) she wants to get off the clomipramine 25 mg. she thinks that that's the cause of the breakdown. She says that's the only thing that's been changed. She says that it did help with the OCD but it's making her "crazy". She says that about 3-4 days after she began to have breakdowns and they've gotten worse. She says that she does have SI but she doesn't think she is going to hurt herself. She would like to dc the medication. Lv 2/13. 1/3 Clompipramine 25 mg was added.   Please advise.

## 2023-03-05 NOTE — Telephone Encounter (Signed)
Pt reports that she is having daily breakdowns and they cause her to feel very bad/down, feels worthless and tearful for a while and then feels better at the end of the day. Thinks she notices this since starting on clomipramine.

## 2023-03-09 ENCOUNTER — Ambulatory Visit: Payer: Medicare PPO | Admitting: Gastroenterology

## 2023-03-09 ENCOUNTER — Encounter: Payer: Self-pay | Admitting: Gastroenterology

## 2023-03-09 ENCOUNTER — Ambulatory Visit: Payer: Medicare PPO

## 2023-03-09 VITALS — BP 128/85 | HR 86 | Temp 98.6°F | Ht 64.0 in | Wt 181.8 lb

## 2023-03-09 DIAGNOSIS — K219 Gastro-esophageal reflux disease without esophagitis: Secondary | ICD-10-CM

## 2023-03-09 DIAGNOSIS — K529 Noninfective gastroenteritis and colitis, unspecified: Secondary | ICD-10-CM | POA: Diagnosis not present

## 2023-03-09 DIAGNOSIS — R112 Nausea with vomiting, unspecified: Secondary | ICD-10-CM

## 2023-03-09 DIAGNOSIS — R1013 Epigastric pain: Secondary | ICD-10-CM

## 2023-03-09 DIAGNOSIS — Z8719 Personal history of other diseases of the digestive system: Secondary | ICD-10-CM | POA: Diagnosis not present

## 2023-03-09 DIAGNOSIS — F5022 Bulimia nervosa, moderate: Secondary | ICD-10-CM

## 2023-03-09 DIAGNOSIS — G8929 Other chronic pain: Secondary | ICD-10-CM

## 2023-03-09 DIAGNOSIS — F502 Bulimia nervosa, unspecified: Secondary | ICD-10-CM

## 2023-03-09 DIAGNOSIS — K2100 Gastro-esophageal reflux disease with esophagitis, without bleeding: Secondary | ICD-10-CM

## 2023-03-09 MED ORDER — ESOMEPRAZOLE MAGNESIUM 40 MG PO CPDR: 40.0000 mg | DELAYED_RELEASE_CAPSULE | Freq: Every day | ORAL | 1 refills | Status: DC

## 2023-03-09 NOTE — Patient Instructions (Addendum)
I want you to increase your Nexium to 40 mg once daily.  I have sent in a new prescription for you today, for now you may take 2 of your 20 mg tablets until you run out and then pick up your new prescription.  Continue to take least 30 minutes prior to breakfast on empty stomach.  You may continue to use the Carafate as needed for breakthrough.  Follow a GERD diet:  Avoid fried, fatty, greasy, spicy, citrus foods. Avoid caffeine and carbonated beverages. Avoid chocolate. Try eating 4-6 small meals a day rather than 3 large meals. Do not eat within 3 hours of laying down. Prop head of bed up on wood or bricks to create a 6 inch incline.  Continue to follow with your therapist and doing your brain spotting.  If you have return of diarrhea, you may resume colestipol 4 g once daily as needed.  You may continue to use dicyclomine as needed for severe abdominal cramping.  Continue to use Zofran if needed, we will keep this on your list for severe episodes.  Follow-up in 6 months.  Great to see you again today!  I want to create trusting relationships with patients. If you receive a survey regarding your visit,  I greatly appreciate you taking time to fill this out on paper or through your MyChart. I value your feedback.  Brooke Bonito, MSN, FNP-BC, AGACNP-BC [facility]

## 2023-03-09 NOTE — Progress Notes (Signed)
GI Office Note    Referring Provider: Anabel Halon, MD Primary Care Physician:  Anabel Halon, MD Primary Gastroenterologist: Gerrit Friends.Rourk, MD  Date:  03/09/2023  ID:  Jennifer [name], DOB Oct 05, 1965, MRN [id number]   Chief Complaint   Chief Complaint  Patient presents with   Follow-up    Follow up on abd pain. Pt has good days and bad days    History of Present Illness  Jennifer [name] is a 58 y.o. female with a history of bulimia, GERD, anxiety, bipolar 1 depression, fatty liver, arthritis, and chronic diarrhea presenting today for follow-up.  Colonoscopy September 2021: -2 sigmoid polyps -Advised repeat in 10 years   Clinic visit in April 2023 with complaint of diarrhea.  Having 3-4 loose stools per day, small to moderate amounts.  Also with incomplete defecation and occasional urgency.  Denies nocturnal stools.  Abdominal pain relieved with defecation.  Pain primarily periumbilical region and lower abdomen.  Diarrhea made worse with eating.  Had good relief with over-the-counter Imodium.  Negative C. difficile, Giardia.  GI pathogen panel was positive for norovirus.   PCP visit in July again noting chronic diarrhea.  Had some improvement with Bentyl fiber, and probiotic.  Also reported chronic nausea due to bulimia.  Started on cholestyramine 4 g twice daily.   Visit on 10/01/2021, virtual follow-up.  Imodium remained helpful with diarrhea however reported more abdominal pain.  Cholestyramine also somewhat helpful however difficult with twice daily dosing as she reported all contents do not seem to resolve.  Still having urgency with varying bowel habits.  Sometimes 8-9 stools per day and varying amounts.  Denied nausea, melena, BRBPR, fever, or chills.  Reports scattered bulimic episodes.  Advised Colestid 4 g 2-3 times daily.  Advised to continue Levsin for abdominal pain.  Use Imodium prior to meals if persistent symptoms.  Advised to keep a food and symptom log.  Potential  to try Xifaxan in the future if symptoms continue.   She underwent cardiac ablation in October given symptomatic SVT.  Ablation was successful and her metoprolol was discontinued.  She was advised to follow-up in 1 year.    Office visit 12/22/21. Having a bad migraine that day.  Zofran somewhat helping.  Continue to have issues with purging and continue to see psychiatry and therapy.  Mostly having dry heaves occurring in the mornings most of the week lasting 10 to 15 minutes at a time.  Improves if she lays back down.  Continues to have occasional abdominal pain with diarrhea however felt as though Colestid was working well for her.  Taking it mostly once daily, at times she takes it twice a day it may not be as easy for her to go.  Continues to admit to improvement of bloating if she does not purge.  Advised to continue dicyclomine 20 mg as needed.  Zofran refilled.  Advised to continue Colestid 4 g once to twice daily for diarrhea, also refilled.  Continue to advise famotidine daily and increase to twice daily if needed, continue follow-up with therapy for bulimia/purging, and to avoid lactose products.   OV 03/10/22.  Probiotic which helped improve her diarrhea therefore she stopped her colestipol and then had return of diarrhea.  Diarrhea was causing redness inflammation on her perineum.  Nausea improved, only having dry heaves occasionally.  Has limiting Zofran is severe and she needs it.  Did report some recent purging associated with abdominal spasms.  Continuing to undergo weekly  therapy and brain spotting for bulimia and bipolar disorder.  At improvement abdominal pain since starting probiotic.  Not using dicyclomine much at all.  Advised Desitin as needed for perineal irritation.  Advised to resume colestipol 4 g once to twice daily.  Continue dicyclomine 20 mg as needed.  Continue famotidine once daily and Zofran as needed.  Avoid lactose.   Patient sent in a message at the end of February  reporting stomach upset/pain.  Continues to have purging.  Tried dicyclomine 1 night and Levsin management.  Consider short course of Carafate for 2 weeks.  Advised to increase famotidine to twice daily.   Received notification the patient recently went to the ED at the beginning of March for abdominal pain and vomiting.  Reinforced the need to stop all NSAIDs.  Advised that she may need possible repeat EGD.  Nexium was sent in given intolerance to pantoprazole in the past due to severe diarrhea.   CT A/P 03/25/2022: No acute abnormality in the abdomen or pelvis, moderate hiatal hernia.   Last office visit 04/21/2022.  Patient having epigastric pain more with sitting up in the morning down.  Has some relief with Mylanta.  Tried to have a hamburger however had upset stomach and pain shortly after followed by increased nausea.  Had not been taking pantoprazole, only famotidine twice daily.  After her ED visit she has started on Nexium.  Denied any dysphagia.  Diarrhea controlled with colestipol and dicyclomine as needed.  Reportedly to recent purging episodes. Scheduled for EGD.  Advised to avoid NSAIDs, follow GERD diet.  Continue Nexium 20 mg once daily and famotidine 20 mg twice daily.  Continue colestipol 4 g once daily and dicyclomine up to 3 times daily as needed.   EGD 05/20/2022: -Normal esophagus -Medium size hiatal hernia -Normal duodenum -Advise continue current medications -Advised Carafate suspension 1 g before meals and at bedtime as needed for pain and nausea  Last office visit 09/08/22.  Reportedly still purging but continue to follow with dietitian and behavioral health.  Using an app to help track her meals as well as her feelings and thoughts.  Continues to have some epigastric pain with purging.  Having some loose stools as well as some constipation.  Taking colestipol as needed, has needed up to 8 tablets/day in the past.  Having some intermittent urgency as well.  Taking Nexium 20 mg once  daily over-the-counter and Carafate as needed.  Taking dicyclomine as well as needed however limits her use given insomnia.  Overall feeling better.  Advised to continue Nexium, Carafate.  Continue colestipol and dicyclomine as needed.  Continue to follow GERD diet and following with behavioral health in regards to bulimia.   Today:  Will need dicyclomine only as needed given it gives her insomnia - only about 5 times in the last several months. Will take Carafate about 3 times a week. Has epigastric pain and fullness when it occurs. It does come and go - sometimes it does happen after meals but reports she did not eat that much.   Still following with behavioral health and working on her therapy sessions for her bulemia. She states she has had less frequent purging episodes. Happening 2-3 times a week. Sometimes can go 2-3 days. Still doing some brain spotting (monthly) along with her therapy (every week).   Has stopped the colestipol and has not really had any diarrhea. Some of the inflammation she has been having in her perineum she will use antifungal  as needed and some sitz bath. Has a daily BM 1-2 a day on average. She may skip a day with the dicyclomine when she takes it. Still trying to eat a healthier diet.   Wt Readings from Last 3 Encounters:  03/09/23 181 lb 12.8 oz (82.5 kg)  02/09/23 179 lb 6.4 oz (81.4 kg)  12/15/22 180 lb (81.6 kg)    Current Outpatient Medications  Medication Sig Dispense Refill   albuterol (VENTOLIN HFA) 108 (90 Base) MCG/ACT inhaler Inhale 2 puffs into the lungs every 6 (six) hours as needed for wheezing or shortness of breath. 18 g 5   Brexpiprazole ([name]) 4 MG TABS Take 1 tablet (4 mg total) by mouth in the morning. 30 tablet 11   clonazePAM (KLONOPIN) 0.5 MG tablet Take 1 tablet (0.5 mg total) by mouth 3 (three) times daily as needed for anxiety. 30 tablet 1   clotrimazole-betamethasone (LOTRISONE) cream Apply 1 Application topically daily. 30 g 0    dicyclomine (BENTYL) 20 MG tablet Take 1 tablet (20 mg total) by mouth 3 (three) times daily as needed for spasms (Diarrhea). 30 tablet 1   esomeprazole (NEXIUM) 20 MG capsule Take 1 capsule (20 mg total) by mouth daily. 30 capsule 5   Famotidine-Ca Carb-Mag Hydrox (PEPCID COMPLETE PO) Take 1 tablet by mouth in the morning.     fluticasone (FLONASE) 50 MCG/ACT nasal spray Place 2 sprays into both nostrils daily. 16 g 6   fluvoxaMINE (LUVOX) 100 MG tablet Take 2 tablets (200 mg total) by mouth 2 (two) times daily. 360 tablet 3   gabapentin (NEURONTIN) 600 MG tablet 2 p.o. every morning, 1 p.o. q. afternoon, 2 p.o. nightly. 150 tablet 11   hydrOXYzine (ATARAX) 25 MG tablet Take 1-2 tablets (25-50 mg total) by mouth every 8 (eight) hours as needed. (Patient taking differently: Take 12.5 mg by mouth at bedtime as needed (sleep).) 90 tablet 11   loratadine (CLARITIN) 10 MG tablet Take 10 mg by mouth in the morning and at bedtime. Morning & afternoon.     mirtazapine (REMERON) 7.5 MG tablet Take 1 tablet (7.5 mg total) by mouth at bedtime. 30 tablet 1   naproxen sodium (ALEVE) 220 MG tablet Take 660 mg by mouth daily as needed (migraine).     ondansetron (ZOFRAN-ODT) 4 MG disintegrating tablet Take 1 tablet (4 mg total) by mouth every 8 (eight) hours as needed for nausea or vomiting. 30 tablet 0   Probiotic Product (DAILY PROBIOTIC PO) Take 2 capsules by mouth daily with lunch.     RESTASIS 0.05 % ophthalmic emulsion Place 1 drop into both eyes 2 (two) times daily.     sucralfate (CARAFATE) 1 g tablet Take 1 g slurry around meals and at bedtime as needed for nausea and epigastric pain. 120 tablet 3   traZODone (DESYREL) 100 MG tablet Take 1-2 tablets (100-200 mg total) by mouth at bedtime as needed for sleep. 60 tablet 11   Ubrogepant (UBRELVY) 100 MG TABS Take 100 mg by mouth with or without food. If needed, a second dose may be taken at least 2 hours after the initial dose. The maximum dose in a 24-hour  period is 200 mg. 10 tablet 11   valACYclovir (VALTREX) 500 MG tablet Take 500 mg by mouth daily at 12 noon.     clomiPRAMINE (ANAFRANIL) 25 MG capsule Take 1 capsule (25 mg total) by mouth at bedtime. 30 capsule 11   No current facility-administered medications for this visit.  Past Medical History:  Diagnosis Date   Abdominal pain, epigastric 10/07/2018   Acute non-recurrent maxillary sinusitis 11/07/2019   Anxiety    Arthritis    Binge-eating and purging type anorexia nervosa    Bipolar affective disorder (HCC) 12/04/2015   Bipolar affective disorder, current episode manic without psychotic symptoms (HCC) 12/04/2015   Bipolar disorder (HCC)    Cellulitis and abscess of buttock 02/15/2013   Cystitis, interstitial    Depression    Depression    Phreesia 01/28/2020   Dysrhythmia    Eczema    Emphysema lung (HCC)    Esophageal polyp    about 20 years ago   GERD (gastroesophageal reflux disease)    Hypertension    Left wrist pain 03/22/2019   OCD (obsessive compulsive disorder)    Palpitations 05/25/2014   Rectal pain 12/06/2018   Recurrent upper respiratory infection (URI)    Rib pain on right side 01/31/2019   Rosacea 03/27/2019   Sprain of temporomandibular joint or ligament 01/10/2019   Tobacco abuse 12/17/2021    Past Surgical History:  Procedure Laterality Date   BLADDER SURGERY     CHOLECYSTECTOMY     COLONOSCOPY WITH PROPOFOL N/A 10/05/2019   Procedure: COLONOSCOPY WITH PROPOFOL;  Surgeon: Corbin Ade, MD;  Location: [facility];  Service: Endoscopy;  Laterality: N/A;  12:45pm   COSMETIC SURGERY N/A    Phreesia 01/28/2020   ESOPHAGOGASTRODUODENOSCOPY  09/28/2016   [facility]; Dr. Marca Ancona; erosions in the esophagus, 5 cm hiatal hernia, nonbleeding erosive gastropathy s/p biopsied, normal duodenum.  Path with chronic inactive gastritis, no H. pylori or intestinal metaplasia.    ESOPHAGOGASTRODUODENOSCOPY (EGD) WITH PROPOFOL N/A 10/17/2018   Dr. Jena Gauss: mild  reflux esophagitis, small hiatal hernia   ESOPHAGOGASTRODUODENOSCOPY (EGD) WITH PROPOFOL N/A 05/20/2022   Procedure: ESOPHAGOGASTRODUODENOSCOPY (EGD) WITH PROPOFOL;  Surgeon: Corbin Ade, MD;  Location: [facility];  Service: Endoscopy;  Laterality: N/A;  7:30am;ASA 3   POLYPECTOMY  10/05/2019   Procedure: POLYPECTOMY;  Surgeon: Corbin Ade, MD;  Location: [facility];  Service: Endoscopy;;   SVT ABLATION N/A 10/31/2021   Procedure: SVT ABLATION;  Surgeon: Maurice Small, MD;  Location: [facility];  Service: Cardiovascular;  Laterality: N/A;   VOCAL CORD LATERALIZATION, ENDOSCOPIC APPROACH W/ MLB     vocal cord nodule removed      Family History  Problem Relation Age of Onset   Healthy Mother    Healthy Father    Migraines Paternal Grandmother    Colon cancer Neg Hx    Colon polyps Neg Hx     Allergies as of 03/09/2023 - Review Complete 03/09/2023  Allergen Reaction Noted   Codeine Shortness Of Breath, Nausea Only, and Rash 02/15/2013   Penicillins Hives, Rash, Shortness Of Breath, and Swelling 10/09/2011   Depakote [divalproex sodium] Hives, Itching, and Rash 01/28/2020   Mobic [meloxicam] Nausea And Vomiting and Other (See Comments) 05/08/2014   Sonata [zaleplon] Other (See Comments) 10/22/2017   Topamax [topiramate] Other (See Comments) 10/04/2018   Aciphex [rabeprazole] Swelling 07/16/2020   Aimovig [erenumab-aooe] Itching 06/26/2019   Emgality [galcanezumab-gnlm] Hives and Rash 10/04/2018    Social History   Socioeconomic History   Marital status: Married    Spouse name: Jennifer   Number of children: 0   Years of education: Not on file   Highest education level: Bachelor's degree (e.g., BA, AB, BS)  Occupational History   Not on file  Tobacco Use  Smoking status: Former    Current packs/day: 0.00    Average packs/day: 1 pack/day for 33.8 years (33.8 ttl pk-yrs)    Types: Cigarettes    Start date: 01/20/1988    Quit date: 11/12/2021    Years  since quitting: 1.3   Smokeless tobacco: Never  Vaping Use   Vaping status: Never Used  Substance and Sexual Activity   Alcohol use: No    Alcohol/week: 0.0 standard drinks of alcohol   Drug use: No   Sexual activity: Yes    Birth control/protection: None  Other Topics Concern   Not on file  Social History Narrative   Lives with husband -Jennifer [name] of 15 years       [employer]ie- [employer]      Enjoys: reading-all genres      Diet: eats all food groups   Caffeine: 1 cup daily at times, diet dr pepper daily   Water: gatorade  zero and water       Wears seat belt    Does not use phone while driving   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   Licensed conveyancer  -safe area         Social Drivers of Health   Financial Resource Strain: Medium Risk (02/05/2023)   Overall Financial Resource Strain (CARDIA)    Difficulty of Paying Living Expenses: Somewhat hard  Food Insecurity: No Food Insecurity (02/05/2023)   Hunger Vital Sign    Worried About Running Out of Food in the Last Year: Never true    Ran Out of Food in the Last Year: Never true  Transportation Needs: No Transportation Needs (02/05/2023)   PRAPARE - Administrator, Civil Service (Medical): No    Lack of Transportation (Non-Medical): No  Physical Activity: Insufficiently Active (02/05/2023)   Exercise Vital Sign    Days of Exercise per Week: 2 days    Minutes of Exercise per Session: 20 min  Stress: Stress Concern Present (02/05/2023)   [employer] of Occupational Health - Occupational Stress Questionnaire    Feeling of Stress : Rather much  Social Connections: Socially Integrated (02/05/2023)   Social Connection and Isolation Panel [NHANES]    Frequency of Communication with Friends and Family: More than three times a week    Frequency of Social Gatherings with Friends and Family: Once a week    Attends Religious Services: 1 to 4 times per year    Active Member of [employer] or Organizations: Patient declined     Attends Engineer, structural: More than 4 times per year    Marital Status: Married     Review of Systems   Gen: + bulemia, anxiety. Denies fever, chills. Denies fatigue, weakness, weight loss.  CV: Denies chest pain, palpitations, syncope, peripheral edema, and claudication. Resp: Denies dyspnea at rest, cough, wheezing, coughing up blood, and pleurisy. GI: See HPI Derm: Denies rash, itching, dry skin Psych: + anxiety, depression. Denies memory loss, confusion. No homicidal or suicidal ideation.  Heme: Denies bruising, bleeding, and enlarged lymph nodes.  Physical Exam   BP 128/85   Pulse 86   Temp 98.6 F (37 C)   Ht 5\' 4"  (1.626 m)   Wt 181 lb 12.8 oz (82.5 kg)   LMP 10/07/2011   BMI 31.21 kg/m   General:   Alert and oriented. No distress noted. Pleasant and cooperative.  Head:  Normocephalic and atraumatic. Eyes:  Conjuctiva clear without scleral icterus. Mouth:  Oral mucosa pink  and moist. Good dentition. No lesions. Abdomen:  +BS, soft, non-distended. Mild ttp to epigastrium. No rebound or guarding. No HSM or masses noted. Rectal: deferred Msk:  Symmetrical without gross deformities. Normal posture. Extremities:  Without edema. Neurologic:  Alert and  oriented x4 Psych:  Alert and cooperative. Normal mood and affect.  Assessment  RABIAH GOESER is a 58 y.o. female with a history of bulimia, GERD, anxiety, bipolar 1 depression, fatty liver, arthritis, and chronic diarrhea presenting today for follow-up.   Chronic diarrhea: Currently well-controlled.  Not needing any colestipol since her last visit.  Having a bowel movement 1-2 times a day and is soft in nature, no loose stools.  No complaints of any constipation at this time.  Will continue to keep colestipol on hand given diarrhea can come out of the blue, suspect some of this is related to psychosocial events as well as food triggers.  Continues to keep dicyclomine on hand given abdominal cramping related to  bowel movements.  GERD, nausea, bulimia epigastric pain: Continues to struggle with her bulimia but continues to follow with behavioral health and does bring spotting once per month.  Continues to have some epigastric pain with purging however she has had some decreased episodes since her last visit.  Sometimes food does tend to be a trigger for her epigastric pain.  She does have some intermittent nausea as well, keeps her Zofran on hand just in case.  Currently on Nexium 20 mg once daily, could be some worsening acid reflux symptoms therefore given history of moderate-sized hiatal hernia will increase Nexium to 40 mg daily in case there are some mild erosions or worsening gastritis.  Okay with using Carafate as needed for breakthrough.  PLAN   Continue Nexium, increase to 40 mg once daily. Continue Carafate as needed Colestipol 4g as needed Continue dicyclomine as needed for severe abdominal cramping Zofran as needed Follow up in 6 months.     Brooke Bonito, MSN, FNP-BC, AGACNP-BC [facility]

## 2023-03-10 DIAGNOSIS — F4310 Post-traumatic stress disorder, unspecified: Secondary | ICD-10-CM | POA: Diagnosis not present

## 2023-03-10 DIAGNOSIS — F429 Obsessive-compulsive disorder, unspecified: Secondary | ICD-10-CM | POA: Diagnosis not present

## 2023-03-10 DIAGNOSIS — F319 Bipolar disorder, unspecified: Secondary | ICD-10-CM | POA: Diagnosis not present

## 2023-03-15 NOTE — Telephone Encounter (Signed)
 If she is willing to come in for an appointment, that may be best. If pain is worsened moreso than prior, it would be better for someone to see her. If not wanting to do an appt, stay on the Nexium daily, and increase the carafate to QID scheduled.

## 2023-03-16 ENCOUNTER — Ambulatory Visit (INDEPENDENT_AMBULATORY_CARE_PROVIDER_SITE_OTHER): Payer: Medicare PPO

## 2023-03-16 ENCOUNTER — Telehealth: Payer: Self-pay | Admitting: Gastroenterology

## 2023-03-16 VITALS — Ht 64.0 in | Wt 180.0 lb

## 2023-03-16 DIAGNOSIS — Z0000 Encounter for general adult medical examination without abnormal findings: Secondary | ICD-10-CM | POA: Diagnosis not present

## 2023-03-16 DIAGNOSIS — Z0100 Encounter for examination of eyes and vision without abnormal findings: Secondary | ICD-10-CM

## 2023-03-16 NOTE — Progress Notes (Signed)
 Because this visit was a virtual/telehealth visit,  certain criteria was not obtained, such a blood pressure, CBG if applicable, and timed get up and go. Any medications not marked as "taking" were not mentioned during the medication reconciliation part of the visit. Any vitals not documented were not able to be obtained due to this being a telehealth visit or patient was unable to self-report a recent blood pressure reading due to a lack of equipment at home via telehealth. Vitals that have been documented are verbally provided by the patient.   Subjective:   Jennifer [name] is a 58 y.o. who presents for a Medicare Wellness preventive visit.  Visit Complete: Virtual I connected with  MAZIAH SMOLA on 03/16/23 by a audio enabled telemedicine application and verified that I am speaking with the correct person using two identifiers.  Patient Location: Home  Provider Location: Home Office  I discussed the limitations of evaluation and management by telemedicine. The patient expressed understanding and agreed to proceed.  Vital Signs: Because this visit was a virtual/telehealth visit, some criteria may be missing or patient reported. Any vitals not documented were not able to be obtained and vitals that have been documented are patient reported.  VideoError- Librarian, academic were attempted between this provider and patient, however failed, due to patient having technical difficulties OR patient did not have access to video capability.  We continued and completed visit with audio only.   AWV Questionnaire: Yes: Patient Medicare AWV questionnaire was completed by the patient on 02/05/2023; I have confirmed that all information answered by patient is correct and no changes since this date.  Cardiac Risk Factors include: advanced age (>45men, >56 women);hypertension;obesity (BMI >30kg/m2);sedentary lifestyle     Objective:    Today's Vitals   03/16/23 1041 03/16/23  1043  Weight: 180 lb (81.6 kg)   Height: 5\' 4"  (1.626 m)   PainSc:  5    Body mass index is 30.9 kg/m.     03/16/2023   10:48 AM 05/20/2022    6:23 AM 05/18/2022    9:16 AM 03/25/2022   11:00 AM 10/31/2021    5:40 AM 10/16/2021   11:55 AM 09/10/2021    4:10 PM  Advanced Directives  Does Patient Have a Medical Advance Directive? No No No No No No No  Would patient like information on creating a medical advance directive? No - Patient declined No - Patient declined No - Patient declined   Yes (MAU/Ambulatory/Procedural Areas - Information given)     Current Medications (verified) Outpatient Encounter Medications as of 03/16/2023  Medication Sig   albuterol (VENTOLIN HFA) 108 (90 Base) MCG/ACT inhaler Inhale 2 puffs into the lungs every 6 (six) hours as needed for wheezing or shortness of breath.   Brexpiprazole ([name]) 4 MG TABS Take 1 tablet (4 mg total) by mouth in the morning.   clonazePAM (KLONOPIN) 0.5 MG tablet Take 1 tablet (0.5 mg total) by mouth 3 (three) times daily as needed for anxiety.   clotrimazole-betamethasone (LOTRISONE) cream Apply 1 Application topically daily.   dicyclomine (BENTYL) 20 MG tablet Take 1 tablet (20 mg total) by mouth 3 (three) times daily as needed for spasms (Diarrhea).   esomeprazole (NEXIUM) 40 MG capsule Take 1 capsule (40 mg total) by mouth daily.   Famotidine-Ca Carb-Mag Hydrox (PEPCID COMPLETE PO) Take 1 tablet by mouth in the morning.   fluticasone (FLONASE) 50 MCG/ACT nasal spray Place 2 sprays into both nostrils daily.   fluvoxaMINE (  LUVOX) 100 MG tablet Take 2 tablets (200 mg total) by mouth 2 (two) times daily.   gabapentin (NEURONTIN) 600 MG tablet 2 p.o. every morning, 1 p.o. q. afternoon, 2 p.o. nightly.   hydrOXYzine (ATARAX) 25 MG tablet Take 1-2 tablets (25-50 mg total) by mouth every 8 (eight) hours as needed. (Patient taking differently: Take 12.5 mg by mouth at bedtime as needed (sleep).)   loratadine (CLARITIN) 10 MG tablet Take 10  mg by mouth in the morning and at bedtime. Morning & afternoon.   mirtazapine (REMERON) 7.5 MG tablet Take 1 tablet (7.5 mg total) by mouth at bedtime.   naproxen sodium (ALEVE) 220 MG tablet Take 660 mg by mouth daily as needed (migraine).   ondansetron (ZOFRAN-ODT) 4 MG disintegrating tablet Take 1 tablet (4 mg total) by mouth every 8 (eight) hours as needed for nausea or vomiting.   Probiotic Product (DAILY PROBIOTIC PO) Take 2 capsules by mouth daily with lunch.   RESTASIS 0.05 % ophthalmic emulsion Place 1 drop into both eyes 2 (two) times daily.   sucralfate (CARAFATE) 1 g tablet Take 1 g slurry around meals and at bedtime as needed for nausea and epigastric pain.   traZODone (DESYREL) 100 MG tablet Take 1-2 tablets (100-200 mg total) by mouth at bedtime as needed for sleep.   Ubrogepant (UBRELVY) 100 MG TABS Take 100 mg by mouth with or without food. If needed, a second dose may be taken at least 2 hours after the initial dose. The maximum dose in a 24-hour period is 200 mg.   valACYclovir (VALTREX) 500 MG tablet Take 500 mg by mouth daily at 12 noon.   No facility-administered encounter medications on file as of 03/16/2023.    Allergies (verified) Codeine, Penicillins, Depakote [divalproex sodium], Mobic [meloxicam], Sonata [zaleplon], Topamax [topiramate], Aciphex [rabeprazole], Aimovig [erenumab-aooe], and Emgality [galcanezumab-gnlm]   History: Past Medical History:  Diagnosis Date   Abdominal pain, epigastric 10/07/2018   Acute non-recurrent maxillary sinusitis 11/07/2019   Allergy 10/20/2018   Runny nose   Anxiety    Arthritis    Binge-eating and purging type anorexia nervosa    Bipolar affective disorder (HCC) 12/04/2015   Bipolar affective disorder, current episode manic without psychotic symptoms (HCC) 12/04/2015   Bipolar disorder (HCC)    Cellulitis and abscess of buttock 02/15/2013   Chronic kidney disease 2020   Growths detected on kidneys when had ultrasound 3 or 4  years ago.   Cystitis, interstitial    Depression    Depression    Phreesia 01/28/2020   Dysrhythmia    Eczema    Emphysema lung (HCC)    Emphysema of lung (HCC) 2022   Esophageal polyp    about 20 years ago   GERD (gastroesophageal reflux disease)    Hypertension    Left wrist pain 03/22/2019   Neuromuscular disorder (HCC) 2019   OCD (obsessive compulsive disorder)    Palpitations 05/25/2014   Rectal pain 12/06/2018   Recurrent upper respiratory infection (URI)    Rib pain on right side 01/31/2019   Rosacea 03/27/2019   Sprain of temporomandibular joint or ligament 01/10/2019   Tobacco abuse 12/17/2021   Ulcer Doctor Rourk   Stomach   Past Surgical History:  Procedure Laterality Date   BLADDER SURGERY     CHOLECYSTECTOMY     COLONOSCOPY WITH PROPOFOL N/A 10/05/2019   Procedure: COLONOSCOPY WITH PROPOFOL;  Surgeon: Corbin Ade, MD;  [facility];  Service: Endoscopy;  Laterality: N/A;  12:45pm   COSMETIC SURGERY N/A    Phreesia 01/28/2020   ESOPHAGOGASTRODUODENOSCOPY  09/28/2016   [facility]; Dr. Marca Ancona; erosions in the esophagus, 5 cm hiatal hernia, nonbleeding erosive gastropathy s/p biopsied, normal duodenum.  Path with chronic inactive gastritis, no H. pylori or intestinal metaplasia.    ESOPHAGOGASTRODUODENOSCOPY (EGD) WITH PROPOFOL N/A 10/17/2018   Dr. Jena Gauss: mild reflux esophagitis, small hiatal hernia   ESOPHAGOGASTRODUODENOSCOPY (EGD) WITH PROPOFOL N/A 05/20/2022   Procedure: ESOPHAGOGASTRODUODENOSCOPY (EGD) WITH PROPOFOL;  Surgeon: Corbin Ade, MD;  [facility];  Service: Endoscopy;  Laterality: N/A;  7:30am;ASA 3   POLYPECTOMY  10/05/2019   Procedure: POLYPECTOMY;  Surgeon: Corbin Ade, MD;  Location: [facility];  Service: Endoscopy;;   SVT ABLATION N/A 10/31/2021   Procedure: SVT ABLATION;  Surgeon: Maurice Small, MD;  Location: [facility];  Service: Cardiovascular;  Laterality: N/A;   VOCAL CORD LATERALIZATION,  ENDOSCOPIC APPROACH W/ MLB     vocal cord nodule removed     Family History  Problem Relation Age of Onset   Healthy Mother    Arthritis Mother    Varicose Veins Mother    Healthy Father    Alcohol abuse Father    Anxiety disorder Father    Depression Father    Early death Father    Migraines Paternal Grandmother    Arthritis Paternal Grandmother    Heart disease Paternal Grandmother    Stroke Paternal Grandfather    Colon cancer Neg Hx    Colon polyps Neg Hx    Social History   Socioeconomic History   Marital status: Married    Spouse name: Tawanna Cooler   Number of children: 0   Years of education: Not on file   Highest education level: Bachelor's degree (e.g., BA, AB, BS)  Occupational History   Not on file  Tobacco Use   Smoking status: Former    Current packs/day: 0.00    Average packs/day: 1 pack/day for 33.8 years (33.8 ttl pk-yrs)    Types: Cigarettes    Start date: 01/20/1988    Quit date: 11/12/2021    Years since quitting: 1.3   Smokeless tobacco: Never   Tobacco comments:    Tried to quit 3 or 4 times.  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   Licensed conveyancer  -safe area         Social Drivers of Health   Financial Resource Strain: Medium Risk (03/16/2023)   Overall Financial Resource Strain (CARDIA)    Difficulty of Paying Living Expenses: Somewhat hard  Food Insecurity: No Food Insecurity (03/16/2023)   Hunger Vital  Sign    Worried About Running Out of Food in the Last Year: Never true    Ran  Out of Food in the Last Year: Never true  Transportation Needs: No Transportation Needs (03/16/2023)   PRAPARE - Administrator, Civil Service (Medical): No    Lack of Transportation (Non-Medical): No  Physical Activity: Insufficiently Active (03/16/2023)   Exercise Vital Sign    Days of Exercise per Week: 2 days    Minutes of Exercise per Session: 20 min  Stress: Stress Concern Present (03/16/2023)   [employer] of Occupational Health - Occupational Stress Questionnaire    Feeling of Stress : Rather much  Social Connections: Unknown (03/16/2023)   Social Connection and Isolation Panel [NHANES]    Frequency of Communication with Friends and Family: More than three times a week    Frequency of Social Gatherings with Friends and Family: Once a week    Attends Religious Services: 1 to 4 times per year    Active Member of [employer] or Organizations: Patient declined    Attends Banker Meetings: Patient declined    Marital Status: Married    Tobacco Counseling Counseling given: Yes Tobacco comments: Tried to quit 3 or 4 times.    Clinical Intake:  Pre-visit preparation completed: Yes  Pain : 0-10 Pain Score: 5  Pain Type: Acute pain Pain Location: Abdomen Pain Onset: Yesterday Pain Frequency: Several days a week     BMI - recorded: 30.9 Nutritional Status: BMI > 30  Obese Nutritional Risks: None Diabetes: No  How often do you need to have someone help you when you read instructions, pamphlets, or other written materials from your doctor or pharmacy?: 1 - Never  Interpreter Needed?: No  Information entered by :: Maryjean Ka CMA   Activities of Daily Living     03/16/2023   10:49 AM 05/18/2022    9:16 AM  In your present state of health, do you have any difficulty performing the following activities:  Hearing? 0 0  Vision? 0 0  Difficulty concentrating or making  decisions? 0 0  Walking or climbing stairs? 0 0  Dressing or bathing? 0 0  Doing errands, shopping? 0   Preparing Food and eating ? N   Using the Toilet? N   In the past six months, have you accidently leaked urine? N   Do you have problems with loss of bowel control? N   Managing your Medications? N   Managing your Finances? N   Housekeeping or managing your Housekeeping? N     Patient Care Team: Anabel Halon, MD as PCP - General (Internal Medicine) Mealor, Roberts Gaudy, MD as PCP - Electrophysiology (Cardiology) Jena Gauss Gerrit Friends, MD as Consulting Physician (Gastroenterology) Aida Raider, NP as Nurse Practitioner (Gastroenterology) Gwynneth Macleod as Physician Assistant (Psychiatry) Dominic Pea MSW, LCSW as Consulting Physician (Behavioral Health) Candelaria Stagers, DPM as Consulting Physician (Podiatry) Champ Mungo, MSW, LCSW as Consulting Physician (Behavioral Health)  Indicate any recent Medical Services you may have received from other than [facility] providers in the past year (date may be approximate).     Assessment:   This is a routine wellness examination for [place].  Hearing/Vision screen Hearing Screening - Comments:: Patient denies any hearing difficulties.   Vision Screening - Comments:: Wears rx glasses - up to date with routine eye exams  Patient is up to date with her exams. Last saw [facility], Dr. Daisy Lazar. Unhappy with service. Referral placed for ophthalmologist.    Goals Addressed  This Visit's Progress    Patient Stated       Patient stated she would like to be able to walk more.        Depression Screen     03/16/2023   10:50 AM 02/09/2023    9:56 AM 12/15/2022    8:18 AM 10/02/2022   11:26 AM 08/11/2022    9:51 AM 02/09/2022    9:53 AM 01/01/2022    9:59 AM  PHQ 2/9 Scores  PHQ - 2 Score 1 6 3 3 3  0 0  PHQ- 9 Score 8 9 15 18 18   0    Fall Risk     03/16/2023   10:47 AM 02/09/2023    9:56 AM 12/15/2022     8:18 AM 10/02/2022   11:25 AM 08/11/2022    9:51 AM  Fall Risk   Falls in the past year? 1 0 1 0 1  Number falls in past yr: 0 0 0 0 0  Injury with Fall? 0 0 0 0 1  Risk for fall due to : Other (Comment) No Fall Risks No Fall Risks No Fall Risks   Risk for fall due to: Comment patient was having a nightmare and fell out of bed. No injuries      Follow up Falls prevention discussed;Education provided Falls evaluation completed Falls evaluation completed Falls evaluation completed     MEDICARE RISK AT HOME:  Medicare Risk at Home Any stairs in or around the home?: Yes If so, are there any without handrails?: No Home free of loose throw rugs in walkways, pet beds, electrical cords, etc?: Yes Adequate lighting in your home to reduce risk of falls?: Yes Life alert?: No Use of a cane, walker or w/c?: No Grab bars in the bathroom?: Yes Shower chair or bench in shower?: Yes Elevated toilet seat or a handicapped toilet?: Yes  TIMED UP AND GO:  Was the test performed?  No  Cognitive Function: 6CIT completed        03/16/2023   10:45 AM 10/16/2021   12:01 PM 10/11/2020   12:03 PM  6CIT Screen  What Year? 0 points 0 points 0 points  What month? 0 points 0 points 0 points  What time? 0 points 0 points 0 points  Count back from 20 0 points 0 points 0 points  Months in reverse 0 points 0 points 0 points  Repeat phrase 0 points 0 points 0 points  Total Score 0 points 0 points 0 points    Immunizations Immunization History  Administered Date(s) Administered   Influenza, Seasonal, Injecte, Preservative Fre 11/10/2022   Influenza,inj,Quad PF,6+ Mos 11/26/2019, 09/17/2021   Influenza,inj,quad, With Preservative 10/19/2018   Influenza-Unspecified 08/25/2013, 11/11/2020   MODERNA COVID-19 SARS-COV-2 PEDS BIVALENT BOOSTER 62yr-27yr 01/27/2021   Moderna Sars-Covid-2 Vaccination 04/01/2019, 05/03/2019, 11/26/2019, 07/19/2020   PNEUMOCOCCAL CONJUGATE-20 01/26/2022, 11/10/2022   Zoster  Recombinant(Shingrix) 11/23/2018, 06/15/2019    Screening Tests Health Maintenance  Topic Date Due   DTaP/Tdap/Td (1 - Tdap) Never done   COVID-19 Vaccine (6 - 2024-25 season) 09/20/2022   Lung Cancer Screening  12/02/2022   MAMMOGRAM  11/16/2023   Medicare Annual Wellness (AWV)  03/15/2024   Cervical Cancer Screening (HPV/Pap Cotest)  10/02/2024   Colonoscopy  10/04/2029   Pneumococcal Vaccine 4-60 Years old  Completed   INFLUENZA VACCINE  Completed   Hepatitis C Screening  Completed   HIV Screening  Completed   Zoster Vaccines- Shingrix  Completed   HPV VACCINES  Aged Out    Health Maintenance  Health Maintenance Due  Topic Date Due   DTaP/Tdap/Td (1 - Tdap) Never done   COVID-19 Vaccine (6 - 2024-25 season) 09/20/2022   Lung Cancer Screening  12/02/2022   Health Maintenance Items Addressed: Patient advised of the vaccines that are due.   Additional Screening:  Vision Screening: Recommended annual ophthalmology exams for early detection of glaucoma and other disorders of the eye.  Dental Screening: Recommended annual dental exams for proper oral hygiene  Community Resource Referral / Chronic Care Management: CRR required this visit?  No   CCM required this visit?  No     Plan:     I have personally reviewed and noted the following in the patient's chart:   Medical and social history Use of alcohol, tobacco or illicit drugs  Current medications and supplements including opioid prescriptions. Patient is not currently taking opioid prescriptions. Functional ability and status Nutritional status Physical activity Advanced directives List of other physicians Hospitalizations, surgeries, and ER visits in previous 12 months Vitals Screenings to include cognitive, depression, and falls Referrals and appointments  In addition, I have reviewed and discussed with patient certain preventive protocols, quality metrics, and best practice recommendations. A written  personalized care plan for preventive services as well as general preventive health recommendations were provided to patient.     Jordan Hawks [place] Pirie, CMA   03/16/2023   After Visit Summary: (MyChart) Due to this being a telephonic visit, the after visit summary with patients personalized plan was offered to patient via MyChart   Notes: Nothing significant to report at this time.

## 2023-03-16 NOTE — Telephone Encounter (Signed)
 Looks like [place] but since she is having a problem can be with whomever can see her first.

## 2023-03-16 NOTE — Telephone Encounter (Signed)
 Patient left a message that for the past 2 days she has had a upset stomach, hurts really bad.  Please advise

## 2023-03-16 NOTE — Patient Instructions (Signed)
 Jennifer [name] , Thank you for taking time to come for your Medicare Wellness Visit. I appreciate your ongoing commitment to your health goals. Please review the following plan we discussed and let me know if I can assist you in the future.   Referrals/Orders/Follow-Ups/Clinician Recommendations:  Next Medicare Annual Wellness Visit:   March 21, 2024 at 10:40 am video visit  [x]  [name] been referred to [facility]. You can call them to schedule your appointment      Address: [address], [place], [place] [address]      Phone: [phone]   This is a list of the screening recommended for you and due dates:  Health Maintenance  Topic Date Due   DTaP/Tdap/Td vaccine (1 - Tdap) Never done   COVID-19 Vaccine (6 - 2024-25 season) 09/20/2022   Screening for Lung Cancer  12/02/2022   Mammogram  11/16/2023   Medicare Annual Wellness Visit  03/15/2024   Pap with HPV screening  10/02/2024   Colon Cancer Screening  10/04/2029   Pneumococcal Vaccination  Completed   Flu Shot  Completed   Hepatitis C Screening  Completed   HIV Screening  Completed   Zoster (Shingles) Vaccine  Completed   HPV Vaccine  Aged Out    Advanced directives: (Declined) Advance directive discussed with you today. Even though you declined this today, please call our office should you change your mind, and we can give you the proper paperwork for you to fill out.  Next Medicare Annual Wellness Visit scheduled for next year: yes  Understanding Your Risk for Falls Millions of people have serious injuries from falls each year. It is important to understand your risk of falling. Talk with your health care provider about your risk and what you can do to lower it. If you do have a serious fall, make sure to tell your provider. Falling once raises your risk of falling again. How can falls affect me? Serious injuries from falls are common. These include: Broken bones, such as hip fractures. Head injuries, such as  traumatic brain injuries (TBI) or concussions. A fear of falling can cause you to avoid activities and stay at home. This can make your muscles weaker and raise your risk for a fall. What can increase my risk? There are a number of risk factors that increase your risk for falling. The more risk factors you have, the higher your risk of falling. Serious injuries from a fall happen most often to people who are older than 58 years old. Teenagers and young adults ages 75-29 are also at higher risk. Common risk factors include: Weakness in the lower body. Being generally weak or confused due to long-term (chronic) illness. Dizziness or balance problems. Poor vision. Medicines that cause dizziness or drowsiness. These may include: Medicines for your blood pressure, heart, anxiety, insomnia, or swelling (edema). Pain medicines. Muscle relaxants. Other risk factors include: Drinking alcohol. Having had a fall in the past. Having foot pain or wearing improper footwear. Working at a dangerous job. Having any of the following in your home: Tripping hazards, such as floor clutter or loose rugs. Poor lighting. Pets. Having dementia or memory loss. What actions can I take to lower my risk of falling?     Physical activity Stay physically fit. Do strength and balance exercises. Consider taking a regular class to build strength and balance. Yoga and tai chi are good options. Vision Have your eyes checked every year and your prescription for glasses or  contacts updated as needed. Shoes and walking aids Wear non-skid shoes. Wear shoes that have rubber soles and low heels. Do not wear high heels. Do not walk around the house in socks or slippers. Use a cane or walker as told by your provider. Home safety Attach secure railings on both sides of your stairs. Install grab bars for your bathtub, shower, and toilet. Use a non-skid mat in your bathtub or shower. Attach bath mats securely with  double-sided, non-slip rug tape. Use good lighting in all rooms. Keep a flashlight near your bed. Make sure there is a clear path from your bed to the bathroom. Use night-lights. Do not use throw rugs. Make sure all carpeting is taped or tacked down securely. Remove all clutter from walkways and stairways, including extension cords. Repair uneven or broken steps and floors. Avoid walking on icy or slippery surfaces. Walk on the grass instead of on icy or slick sidewalks. Use ice melter to get rid of ice on walkways in the winter. Use a cordless phone. Questions to ask your health care provider Can you help me check my risk for a fall? Do any of my medicines make me more likely to fall? Should I take a vitamin D supplement? What exercises can I do to improve my strength and balance? Should I make an appointment to have my vision checked? Do I need a bone density test to check for weak bones (osteoporosis)? Would it help to use a cane or a walker? Where to find more information Centers for Disease Control and Prevention, STEADI: TonerPromos.no Community-Based Fall Prevention Programs: TonerPromos.no [employer] on Aging: BaseRingTones.pl Contact a health care provider if: You fall at home. You are afraid of falling at home. You feel weak, drowsy, or dizzy. This information is not intended to replace advice given to you by your health care provider. Make sure you discuss any questions you have with your health care provider. Document Revised: 09/08/2021 Document Reviewed: 09/08/2021 Elsevier Patient Education  2024 [employer].

## 2023-03-17 ENCOUNTER — Encounter: Payer: Self-pay | Admitting: Gastroenterology

## 2023-03-17 ENCOUNTER — Ambulatory Visit: Payer: Medicare PPO | Admitting: Gastroenterology

## 2023-03-17 VITALS — BP 116/77 | HR 81 | Temp 98.3°F | Ht 64.0 in | Wt 179.4 lb

## 2023-03-17 DIAGNOSIS — R1013 Epigastric pain: Secondary | ICD-10-CM

## 2023-03-17 MED ORDER — HYOSCYAMINE SULFATE 0.125 MG SL SUBL: 0.1250 mg | SUBLINGUAL_TABLET | SUBLINGUAL | 1 refills | Status: DC | PRN

## 2023-03-17 NOTE — Patient Instructions (Signed)
 Stop dicyclomine. Try levsin on days when you develop abdominal pain or that are stressful and you anticipate abdominal pain. Limit to four in 24 hours.   Complete labs. We will be in touch with results as available.

## 2023-03-17 NOTE — Progress Notes (Signed)
 GI Office Note    Referring Provider: Anabel Halon, MD Primary Care Physician:  Anabel Halon, MD  Primary Gastroenterologist: Roetta Sessions, MD   Chief Complaint   Chief Complaint  Patient presents with   Abdominal Pain    Upper abdominal pain, notices it more so when there is an event, thinks it may possibly be a neurological response.    History of Present Illness   Jennifer [name] is a 58 y.o. female presenting today for urgent office visit for abdominal pain.  Patient seen on February 18 with Brooke Bonito, NP.  She has a history of bulimia, GERD, anxiety, bipolar 1 depression, fatty liver, arthritis, chronic diarrhea.  At last office visit it was noted that her chronic diarrhea was actually doing pretty well off Colestid.  She is continuing to struggle with bulimia, purging 2-3 times per week.  Followed by behavioral health, does brain spotting once per month.  Epigastric pain triggered by food, purging.  Intermittent nausea, keeps Zofran on hand.  Nexium was increased to 40 mg daily for possible breakthrough reflux, worsening gastritis.  Carafate continued for breakthrough symptoms.  Dicyclomine as needed for severe abdominal cramping although she notes insomnia with medication and uses infrequently.  Today:  States when she saw Toni Amend last week she was actually feeling the best she has felt in awhile. Since then she had to stop clomipramine as she was having "breakdowns" every day. She continues to work at limiting her purging. Her therapist set a goal for her to do it no more than once a week. She had gotten down to twice weekly. Usually symptoms worse at night. She is trying to keep a routine. Really focusing on distractions when she gets urge. She purged last, five days ago. Notes she started having significant abdominal pain, upper abdomen on Monday. She has had these episodes in the past. Can last for hours at a time. Has to lay down, take TUMS/Mylanta and  eventually will settle down. Typically will not have BM with pain. Usually will have increased urination, already has frequent urination but it will get worse. This episodes she had diarrhea. Normally BM 1-2 per day. No melena, brbpr. Heartburn well controlled on nexium. Epigastric pain is resolving, just sore now.  Sometimes will have brbpr on toilet tissue if she wipes a lot.    Notes that her episodes of epigastric pain seem to be triggered by anxiety/stress regarding situations. Like family gatherings, distant driving, etc.  Does not take dicyclomine very often due to insomnia.     EGD 05/20/2022: -Normal esophagus -Medium size hiatal hernia -Normal duodenum -Advise continue current medications -Advised Carafate suspension 1 g before meals and at bedtime as needed for pain and nausea  Colonoscopy September 2021: -two 2 to 4 mm polyps in the sigmoid colon, hyperplastic -10-year screening colonoscopy  Medications   Current Outpatient Medications  Medication Sig Dispense Refill   albuterol (VENTOLIN HFA) 108 (90 Base) MCG/ACT inhaler Inhale 2 puffs into the lungs every 6 (six) hours as needed for wheezing or shortness of breath. 18 g 5   Brexpiprazole ([name]) 4 MG TABS Take 1 tablet (4 mg total) by mouth in the morning. 30 tablet 11   clonazePAM (KLONOPIN) 0.5 MG tablet Take 1 tablet (0.5 mg total) by mouth 3 (three) times daily as needed for anxiety. 30 tablet 1   clotrimazole-betamethasone (LOTRISONE) cream Apply 1 Application topically daily. 30 g 0   dicyclomine (BENTYL) 20 MG tablet  Take 1 tablet (20 mg total) by mouth 3 (three) times daily as needed for spasms (Diarrhea). 30 tablet 1   esomeprazole (NEXIUM) 40 MG capsule Take 1 capsule (40 mg total) by mouth daily. 90 capsule 1   Famotidine-Ca Carb-Mag Hydrox (PEPCID COMPLETE PO) Take 1 tablet by mouth in the morning.     fluticasone (FLONASE) 50 MCG/ACT nasal spray Place 2 sprays into both nostrils daily. 16 g 6   fluvoxaMINE  (LUVOX) 100 MG tablet Take 2 tablets (200 mg total) by mouth 2 (two) times daily. 360 tablet 3   gabapentin (NEURONTIN) 600 MG tablet 2 p.o. every morning, 1 p.o. q. afternoon, 2 p.o. nightly. 150 tablet 11   hydrOXYzine (ATARAX) 25 MG tablet Take 1-2 tablets (25-50 mg total) by mouth every 8 (eight) hours as needed. (Patient taking differently: Take 12.5 mg by mouth at bedtime as needed (sleep).) 90 tablet 11   loratadine (CLARITIN) 10 MG tablet Take 10 mg by mouth in the morning and at bedtime. Morning & afternoon.     mirtazapine (REMERON) 7.5 MG tablet Take 1 tablet (7.5 mg total) by mouth at bedtime. 30 tablet 1   naproxen sodium (ALEVE) 220 MG tablet Take 660 mg by mouth daily as needed (migraine).     ondansetron (ZOFRAN-ODT) 4 MG disintegrating tablet Take 1 tablet (4 mg total) by mouth every 8 (eight) hours as needed for nausea or vomiting. 30 tablet 0   Probiotic Product (DAILY PROBIOTIC PO) Take 2 capsules by mouth daily with lunch.     RESTASIS 0.05 % ophthalmic emulsion Place 1 drop into both eyes 2 (two) times daily.     sucralfate (CARAFATE) 1 g tablet Take 1 g slurry around meals and at bedtime as needed for nausea and epigastric pain. 120 tablet 3   traZODone (DESYREL) 100 MG tablet Take 1-2 tablets (100-200 mg total) by mouth at bedtime as needed for sleep. 60 tablet 11   Ubrogepant (UBRELVY) 100 MG TABS Take 100 mg by mouth with or without food. If needed, a second dose may be taken at least 2 hours after the initial dose. The maximum dose in a 24-hour period is 200 mg. 10 tablet 11   valACYclovir (VALTREX) 500 MG tablet Take 500 mg by mouth daily at 12 noon.     No current facility-administered medications for this visit.    Allergies   Allergies as of 03/17/2023 - Review Complete 03/17/2023  Allergen Reaction Noted   Codeine Shortness Of Breath, Nausea Only, and Rash 02/15/2013   Penicillins Hives, Rash, Shortness Of Breath, and Swelling 10/09/2011   Depakote [divalproex  sodium] Hives, Itching, and Rash 01/28/2020   Mobic [meloxicam] Nausea And Vomiting and Other (See Comments) 05/08/2014   Sonata [zaleplon] Other (See Comments) 10/22/2017   Topamax [topiramate] Other (See Comments) 10/04/2018   Aciphex [rabeprazole] Swelling 07/16/2020   Aimovig [erenumab-aooe] Itching 06/26/2019   Emgality [galcanezumab-gnlm] Hives and Rash 10/04/2018    Review of Systems   General: Negative for anorexia, weight loss, fever, chills, fatigue, weakness. ENT: Negative for hoarseness, difficulty swallowing , nasal congestion. CV: Negative for chest pain, angina, palpitations, dyspnea on exertion, peripheral edema.  Respiratory: Negative for dyspnea at rest, dyspnea on exertion, cough, sputum, wheezing.  GI: See history of present illness. GU:  Negative for dysuria, hematuria, urinary incontinence, +urinary frequency, nocturnal urination.  Endo: Negative for unusual weight change.     Physical Exam   BP 116/77 (BP Location: Right Arm, Patient Position: Sitting, Cuff  Size: Large)   Pulse 81   Temp 98.3 F (36.8 C) (Oral)   Ht 5\' 4"  (1.626 m)   Wt 179 lb 6.4 oz (81.4 kg)   LMP 10/07/2011   SpO2 95%   BMI 30.79 kg/m    General: Well-nourished, well-developed in no acute distress.  Eyes: No icterus. Mouth: Oropharyngeal mucosa moist and pink  Abdomen: Bowel sounds are normal, nondistended, no hepatosplenomegaly or masses,  no abdominal bruits or hernia , no rebound or guarding. Mild epigastric tenderness Rectal: not performed  Extremities: No lower extremity edema. No clubbing or deformities. Neuro: Alert and oriented x 4   Skin: Warm and dry, no jaundice.   Psych: Alert and cooperative, normal mood and affect.  Labs   No recent labs  Imaging Studies   No results found.  Assessment/Plan:   Acute epigastric pain: -seems to be exacerbated by anxiety/stress but cannot rule out other etiology. Obtain CBC, CMET, lipase -discussed signs/symptoms of  symptomatic hiatal hernia. Given moderate sized, then can also cause some epigastric pain/substernal pain, could be exacerbated by heaving/purging -continue esomeprazole 40mg  daily -trial of antispasmotic for possible functional GI symptoms. She does not tolerate bentyl due to insomnia. RX for levsin up to four times daily with onset of epigastric pain or prior to anticipated anxiety/stress triggering situtaion -encourage continue towards goal of no purging, she is working hard with her therapist and making behavioral modifications  Continue with planned office visit planned in six months to follow up with Brooke Bonito, NP.    Leanna Battles. Melvyn Neth, MHS, PA-C [facility]

## 2023-03-18 ENCOUNTER — Ambulatory Visit (HOSPITAL_COMMUNITY)
Admission: RE | Admit: 2023-03-18 | Discharge: 2023-03-18 | Disposition: A | Discharge status: Home / Self Care | Payer: Medicare PPO | Source: Ambulatory Visit | Attending: Internal Medicine | Admitting: Internal Medicine

## 2023-03-18 DIAGNOSIS — Z122 Encounter for screening for malignant neoplasm of respiratory organs: Secondary | ICD-10-CM | POA: Diagnosis not present

## 2023-03-18 DIAGNOSIS — K449 Diaphragmatic hernia without obstruction or gangrene: Secondary | ICD-10-CM | POA: Insufficient documentation

## 2023-03-18 DIAGNOSIS — F17210 Nicotine dependence, cigarettes, uncomplicated: Secondary | ICD-10-CM | POA: Insufficient documentation

## 2023-03-18 DIAGNOSIS — J439 Emphysema, unspecified: Secondary | ICD-10-CM | POA: Insufficient documentation

## 2023-03-18 DIAGNOSIS — I700 Atherosclerosis of aorta: Secondary | ICD-10-CM | POA: Insufficient documentation

## 2023-03-18 DIAGNOSIS — Z87891 Personal history of nicotine dependence: Secondary | ICD-10-CM | POA: Diagnosis not present

## 2023-03-23 DIAGNOSIS — R1013 Epigastric pain: Secondary | ICD-10-CM | POA: Diagnosis not present

## 2023-03-24 DIAGNOSIS — F4310 Post-traumatic stress disorder, unspecified: Secondary | ICD-10-CM | POA: Diagnosis not present

## 2023-03-24 DIAGNOSIS — F319 Bipolar disorder, unspecified: Secondary | ICD-10-CM | POA: Diagnosis not present

## 2023-03-24 DIAGNOSIS — F429 Obsessive-compulsive disorder, unspecified: Secondary | ICD-10-CM | POA: Diagnosis not present

## 2023-03-24 LAB — CBC WITH DIFFERENTIAL/PLATELET
Basophils Absolute: 0 10*3/uL (ref 0.0–0.2)
Basos: 1 %
EOS (ABSOLUTE): 0.1 10*3/uL (ref 0.0–0.4)
Eos: 3 %
Hematocrit: 43.2 % (ref 34.0–46.6)
Hemoglobin: 14.8 g/dL (ref 11.1–15.9)
Immature Grans (Abs): 0 10*3/uL (ref 0.0–0.1)
Immature Granulocytes: 0 %
Lymphocytes Absolute: 1.7 10*3/uL (ref 0.7–3.1)
Lymphs: 34 %
MCH: 30.6 pg (ref 26.6–33.0)
MCHC: 34.3 g/dL (ref 31.5–35.7)
MCV: 89 fL (ref 79–97)
Monocytes Absolute: 0.4 10*3/uL (ref 0.1–0.9)
Monocytes: 8 %
Neutrophils Absolute: 2.8 10*3/uL (ref 1.4–7.0)
Neutrophils: 54 %
Platelets: 235 10*3/uL (ref 150–450)
RBC: 4.83 x10E6/uL (ref 3.77–5.28)
RDW: 12 % (ref 11.7–15.4)
WBC: 5.1 10*3/uL (ref 3.4–10.8)

## 2023-03-24 LAB — COMPREHENSIVE METABOLIC PANEL
ALT: 20 IU/L (ref 0–32)
AST: 26 IU/L (ref 0–40)
Albumin: 4.8 g/dL (ref 3.8–4.9)
Alkaline Phosphatase: 104 IU/L (ref 44–121)
BUN/Creatinine Ratio: 15 (ref 9–23)
BUN: 13 mg/dL (ref 6–24)
Bilirubin Total: 0.5 mg/dL (ref 0.0–1.2)
CO2: 22 mmol/L (ref 20–29)
Calcium: 9.5 mg/dL (ref 8.7–10.2)
Chloride: 103 mmol/L (ref 96–106)
Creatinine, Ser: 0.89 mg/dL (ref 0.57–1.00)
Globulin, Total: 2.5 g/dL (ref 1.5–4.5)
Glucose: 109 mg/dL — ABNORMAL HIGH (ref 70–99)
Potassium: 4.3 mmol/L (ref 3.5–5.2)
Sodium: 142 mmol/L (ref 134–144)
Total Protein: 7.3 g/dL (ref 6.0–8.5)
eGFR: 76 mL/min/{1.73_m2} (ref >59)

## 2023-03-24 LAB — LIPASE: Lipase: 40 U/L (ref 14–72)

## 2023-03-31 DIAGNOSIS — F319 Bipolar disorder, unspecified: Secondary | ICD-10-CM | POA: Diagnosis not present

## 2023-03-31 DIAGNOSIS — F429 Obsessive-compulsive disorder, unspecified: Secondary | ICD-10-CM | POA: Diagnosis not present

## 2023-03-31 DIAGNOSIS — F4310 Post-traumatic stress disorder, unspecified: Secondary | ICD-10-CM | POA: Diagnosis not present

## 2023-04-07 ENCOUNTER — Telehealth: Payer: Self-pay

## 2023-04-07 DIAGNOSIS — F4310 Post-traumatic stress disorder, unspecified: Secondary | ICD-10-CM | POA: Diagnosis not present

## 2023-04-07 DIAGNOSIS — F429 Obsessive-compulsive disorder, unspecified: Secondary | ICD-10-CM | POA: Diagnosis not present

## 2023-04-07 DIAGNOSIS — F319 Bipolar disorder, unspecified: Secondary | ICD-10-CM | POA: Diagnosis not present

## 2023-04-07 NOTE — Telephone Encounter (Signed)
 Copied from CRM [id number]. Topic: General - Call Back - No Documentation >> Apr 07, 2023  3:06 PM Geneva B wrote: Reason for CRM: patient had a cat scan done at [facility] and wants dr patel to call over there and get her results because she said that they have been taking too long to give them to her but they will  give them to the provider please call patient back [phone]

## 2023-04-19 ENCOUNTER — Ambulatory Visit: Payer: Self-pay | Admitting: Internal Medicine

## 2023-04-19 DIAGNOSIS — F4312 Post-traumatic stress disorder, chronic: Secondary | ICD-10-CM | POA: Diagnosis not present

## 2023-04-21 ENCOUNTER — Ambulatory Visit: Payer: Self-pay

## 2023-04-21 VITALS — BP 108/69 | HR 86 | Ht 64.0 in | Wt 178.0 lb

## 2023-04-21 DIAGNOSIS — F4310 Post-traumatic stress disorder, unspecified: Secondary | ICD-10-CM | POA: Diagnosis not present

## 2023-04-21 DIAGNOSIS — F319 Bipolar disorder, unspecified: Secondary | ICD-10-CM | POA: Diagnosis not present

## 2023-04-21 DIAGNOSIS — M545 Low back pain, unspecified: Secondary | ICD-10-CM | POA: Diagnosis not present

## 2023-04-21 DIAGNOSIS — F429 Obsessive-compulsive disorder, unspecified: Secondary | ICD-10-CM | POA: Diagnosis not present

## 2023-04-21 MED ORDER — KETOROLAC TROMETHAMINE 60 MG/2ML IM SOLN
60.0000 mg | Freq: Once | INTRAMUSCULAR | Status: DC
Start: 2023-04-21 — End: 2023-04-21

## 2023-04-21 MED ORDER — KETOROLAC TROMETHAMINE 60 MG/2ML IM SOLN
60.0000 mg | Freq: Once | INTRAMUSCULAR | Status: AC
  Administered 2023-04-21: 60 mg via INTRAMUSCULAR

## 2023-04-21 MED ORDER — TRIAMCINOLONE ACETONIDE 40 MG/ML IJ SUSP: 40.0000 mg | Freq: Once | INTRAMUSCULAR | Status: DC

## 2023-04-21 MED ORDER — TRIAMCINOLONE ACETONIDE 40 MG/ML IJ SUSP
40.0000 mg | Freq: Once | INTRAMUSCULAR | Status: AC
  Administered 2023-04-21: 40 mg via INTRAMUSCULAR

## 2023-04-21 NOTE — Progress Notes (Unsigned)
   Acute Office Visit  Subjective:     Patient ID: Jennifer [name], female    DOB: [birth year], 58 y.o.   MRN: [id number]  Chief Complaint  Patient presents with   Back Pain    Pt. States she has "pain in her lower back, and back hurts when moving" when asked on a scale of 1-10 "right now its a 4 but before it was a 20" Pt. Does not know what caused it "Went to sleep and woke up in pain"    Back Pain This is a new problem. The current episode started 1 to 4 weeks ago. The problem occurs daily. The problem has been gradually improving since onset. The pain is present in the lumbar spine. The quality of the pain is described as aching. The pain does not radiate. The pain is at a severity of 4/10. The pain is moderate. Pertinent negatives include no bladder incontinence, bowel incontinence, paresthesias or weakness. Risk factors include lack of exercise and menopause. She has tried analgesics, heat and muscle relaxant (tizanidine, but it made her feel "loopy") for the symptoms. The treatment provided mild relief.   Patient is in today for back pain  Review of Systems  Gastrointestinal:  Negative for bowel incontinence.  Genitourinary:  Negative for bladder incontinence.  Musculoskeletal:  Positive for back pain.  Neurological:  Negative for weakness and paresthesias.       Objective:    BP 108/69 (BP Location: Right Arm, Patient Position: Sitting, Cuff Size: Normal)   Pulse 86   Ht 5\' 4"  (1.626 m)   Wt 178 lb (80.7 kg)   LMP 10/07/2011   SpO2 96%   BMI 30.55 kg/m    Physical Exam Vitals and nursing note reviewed.  Constitutional:      Appearance: Normal appearance.  Musculoskeletal:     Lumbar back: Tenderness (R>L) present. No spasms or bony tenderness. Decreased range of motion. Negative right straight leg raise test and negative left straight leg raise test.  Neurological:     Mental Status: She is alert.     No results found for any visits on 04/21/23.       Assessment & Plan:   Problem List Items Addressed This Visit       Other   Lumbar pain - Primary   Likely muscle strain.  She denies any known injury or overexertion.   Kenalog and Toradol administered in office today.   Recommend applying heating pad to the area as needed, no more the 20 minutes at a time.  Epsom salt baths may help as well.   Recommend imaging studies if no improvement.        Meds ordered this encounter  Medications   ketorolac (TORADOL) injection 60 mg   DISCONTD: triamcinolone acetonide (KENALOG-40) injection 40 mg   triamcinolone acetonide (KENALOG-40) injection 40 mg   DISCONTD: ketorolac (TORADOL) injection 60 mg    No follow-ups on file.  Darral Dash, FNP

## 2023-04-22 DIAGNOSIS — M545 Low back pain, unspecified: Secondary | ICD-10-CM | POA: Insufficient documentation

## 2023-04-22 NOTE — Assessment & Plan Note (Signed)
 Likely muscle strain.  She denies any known injury or overexertion.   Kenalog and Toradol administered in office today.   Recommend applying heating pad to the area as needed, no more the 20 minutes at a time.  Epsom salt baths may help as well.   Recommend imaging studies if no improvement.

## 2023-04-26 ENCOUNTER — Telehealth: Payer: Medicare PPO | Admitting: Physician Assistant

## 2023-04-26 ENCOUNTER — Encounter: Payer: Self-pay | Admitting: Physician Assistant

## 2023-04-26 DIAGNOSIS — F429 Obsessive-compulsive disorder, unspecified: Secondary | ICD-10-CM | POA: Diagnosis not present

## 2023-04-26 DIAGNOSIS — F5105 Insomnia due to other mental disorder: Secondary | ICD-10-CM

## 2023-04-26 DIAGNOSIS — F319 Bipolar disorder, unspecified: Secondary | ICD-10-CM | POA: Diagnosis not present

## 2023-04-26 DIAGNOSIS — F99 Mental disorder, not otherwise specified: Secondary | ICD-10-CM

## 2023-04-26 DIAGNOSIS — F5023 Bulimia nervosa, severe: Secondary | ICD-10-CM

## 2023-04-26 DIAGNOSIS — F411 Generalized anxiety disorder: Secondary | ICD-10-CM | POA: Diagnosis not present

## 2023-04-26 MED ORDER — HYDROXYZINE HCL 25 MG PO TABS
25.0000 mg | ORAL_TABLET | Freq: Three times a day (TID) | ORAL | 11 refills | Status: AC | PRN
Start: 2023-04-26 — End: ?

## 2023-04-26 MED ORDER — MIRTAZAPINE 7.5 MG PO TABS: 7.5000 mg | ORAL_TABLET | Freq: Every day | ORAL | 1 refills | Status: DC

## 2023-04-26 NOTE — Progress Notes (Signed)
 [facility] Med Check  Patient ID: Jennifer [name],  MRN: [id number]  PCP: Anabel Halon, MD  Date of Evaluation: 04/26/2023 time spent:30 minutes  Chief Complaint:   Chief Complaint   Anxiety; Depression; Insomnia; Follow-up    Virtual Visit via Telehealth  I connected with patient by a video enabled telemedicine application with their informed consent, and verified patient privacy and that I am speaking with the correct person using two identifiers.  I am private, in my office and the patient is at home.  I discussed the limitations, risks, security and privacy concerns of performing an evaluation and management service by video and the availability of in person appointments. I also discussed with the patient that there may be a patient responsible charge related to this service. The patient expressed understanding and agreed to proceed.   I discussed the assessment and treatment plan with the patient. The patient was provided an opportunity to ask questions and all were answered. The patient agreed with the plan and demonstrated an understanding of the instructions.   The patient was advised to call back or seek an in-person evaluation if the symptoms worsen or if the condition fails to improve as anticipated.  I provided 20  minutes of non-face-to-face time during this encounter.  HISTORY/CURRENT STATUS: HPI for 6-week med check.  Nhi states she is doing well.  She feels like her mental health medications are working as they should. Patient is able to enjoy things.  Energy and motivation are good.  No extreme sadness, tearfulness, or feelings of hopelessness.  ADLs and personal hygiene are normal.   Denies any changes in concentration, making decisions, or remembering things.  Appetite has not changed.  Weight is stable.  She still purges but states it is not as often.  She has chronic abdominal pain and sees GI for this.  Denies suicidal or homicidal thoughts.  She has not  been sleeping as well as she would like.  She has a hard time going to sleep if she does not take the trazodone but even when she takes 200 mg she will often wake up in an hour or 2 and cannot go back to sleep.  She has also been having more frequent migraines lately.  Her medications for rescue does help.  She gets anxious at times and takes the Klonopin which is effective.  Patient denies increased energy with decreased need for sleep, increased talkativeness, racing thoughts, impulsivity or risky behaviors, increased spending, increased libido, grandiosity, increased irritability or anger, paranoia, or hallucinations.  Denies dizziness, syncope, seizures, numbness, tingling, tremor, tics, unsteady gait, slurred speech, confusion. Denies muscle or joint pain, stiffness, or dystonia. Denies unexplained weight loss, frequent infections, or sores that heal slowly.  No polyphagia, polydipsia, or polyuria. Denies visual changes or paresthesias.   Individual Medical History/ Review of Systems: Changes? :No        Past medications for mental health diagnoses include: Risperdal, Seroquel, Prozac, Zoloft,  Wellbutrin, Lamictal, Depakote caused hair loss, Xanax, Ambien, trazodone, Trileptal, Luvox, Topamax, Elavil, Pamelor, BuSpar, doxazosin, prazosin, lithium, Vraylar, Abilify caused increased appetite, caused weight gain which is contraindicated w/ her eating d/o, and didn't work for [employer], [name], Latuda >60 mg caused abd pain and nausea, propranolol caused diarrhea.  Allergies: Codeine, Penicillins, Depakote [divalproex sodium], Mobic [meloxicam], Sonata [zaleplon], Topamax [topiramate], Aciphex [rabeprazole], Aimovig [erenumab-aooe], and Emgality [galcanezumab-gnlm]  Current Medications:  Current Outpatient Medications:    albuterol (VENTOLIN HFA) 108 (90 Base) MCG/ACT inhaler, Inhale 2 puffs  into the lungs every 6 (six) hours as needed for wheezing or shortness of breath., Disp: 18 g, Rfl: 5    Brexpiprazole ([name]) 4 MG TABS, Take 1 tablet (4 mg total) by mouth in the morning., Disp: 30 tablet, Rfl: 11   clonazePAM (KLONOPIN) 0.5 MG tablet, Take 1 tablet (0.5 mg total) by mouth 3 (three) times daily as needed for anxiety., Disp: 30 tablet, Rfl: 1   clotrimazole-betamethasone (LOTRISONE) cream, Apply 1 Application topically daily., Disp: 30 g, Rfl: 0   esomeprazole (NEXIUM) 40 MG capsule, Take 1 capsule (40 mg total) by mouth daily., Disp: 90 capsule, Rfl: 1   Famotidine-Ca Carb-Mag Hydrox (PEPCID COMPLETE PO), Take 1 tablet by mouth in the morning., Disp: , Rfl:    fluticasone (FLONASE) 50 MCG/ACT nasal spray, Place 2 sprays into both nostrils daily., Disp: 16 g, Rfl: 6   fluvoxaMINE (LUVOX) 100 MG tablet, Take 2 tablets (200 mg total) by mouth 2 (two) times daily., Disp: 360 tablet, Rfl: 3   gabapentin (NEURONTIN) 600 MG tablet, 2 p.o. every morning, 1 p.o. q. afternoon, 2 p.o. nightly., Disp: 150 tablet, Rfl: 11   hyoscyamine (LEVSIN/SL) 0.125 MG SL tablet, Place 1 tablet (0.125 mg total) under the tongue every 4 (four) hours as needed (for abdominal cramping/pain)., Disp: 60 tablet, Rfl: 1   loratadine (CLARITIN) 10 MG tablet, Take 10 mg by mouth in the morning and at bedtime. Morning & afternoon., Disp: , Rfl:    naproxen sodium (ALEVE) 220 MG tablet, Take 660 mg by mouth daily as needed (migraine)., Disp: , Rfl:    ondansetron (ZOFRAN-ODT) 4 MG disintegrating tablet, Take 1 tablet (4 mg total) by mouth every 8 (eight) hours as needed for nausea or vomiting., Disp: 30 tablet, Rfl: 0   Probiotic Product (DAILY PROBIOTIC PO), Take 2 capsules by mouth daily with lunch., Disp: , Rfl:    RESTASIS 0.05 % ophthalmic emulsion, Place 1 drop into both eyes 2 (two) times daily., Disp: , Rfl:    sucralfate (CARAFATE) 1 g tablet, Take 1 g slurry around meals and at bedtime as needed for nausea and epigastric pain., Disp: 120 tablet, Rfl: 3   traZODone (DESYREL) 100 MG tablet, Take 1-2 tablets  (100-200 mg total) by mouth at bedtime as needed for sleep., Disp: 60 tablet, Rfl: 11   Ubrogepant (UBRELVY) 100 MG TABS, Take 100 mg by mouth with or without food. If needed, a second dose may be taken at least 2 hours after the initial dose. The maximum dose in a 24-hour period is 200 mg., Disp: 10 tablet, Rfl: 11   valACYclovir (VALTREX) 500 MG tablet, Take 500 mg by mouth daily at 12 noon., Disp: , Rfl:    hydrOXYzine (ATARAX) 25 MG tablet, Take 1-2 tablets (25-50 mg total) by mouth every 8 (eight) hours as needed., Disp: 90 tablet, Rfl: 11   mirtazapine (REMERON) 7.5 MG tablet, Take 1 tablet (7.5 mg total) by mouth at bedtime., Disp: 30 tablet, Rfl: 1 Medication Side Effects: none  Family Medical/ Social History: Changes?  No  MENTAL HEALTH EXAM:  Last menstrual period 10/07/2011.There is no height or weight on file to calculate BMI.  General Appearance: Casual and Well Groomed  Eye Contact:  Good  Speech:  Clear and Coherent and Normal Rate  Volume:  Normal  Mood:  Euthymic  Affect:  Congruent  Thought Process:  Goal Directed and Descriptions of Associations: Circumstantial  Orientation:  Full (Time, Place, and Person)  Thought Content: Logical  Suicidal Thoughts:  No  Homicidal Thoughts:  No  Memory:  WNL  Judgement:  Good  Insight:  Good  Psychomotor Activity:  Normal  Concentration:  Concentration: Good and Attention Span: Good  Recall:  Good  Fund of Knowledge: Good  Language: Good  Assets:  Desire for Improvement Financial Resources/Insurance Housing Leisure Time Resilience Transportation  ADL's:  Intact  Cognition: WNL  Prognosis:  Good   DIAGNOSES:    ICD-10-CM   1. Bipolar I disorder (HCC)  F31.9     2. Obsessive-compulsive disorder, unspecified type  F42.9     3. Generalized anxiety disorder  F41.1     4. Insomnia due to other mental disorder  F51.05    F99     5. Severe bulimia nervosa  F50.23       Receiving Psychotherapy: Yes  Dominic Pea,  LCMSW at [facility]connections, Jacinto Reap for brain spotting  RECOMMENDATIONS:  PDMP was reviewed.  Gabapentin filled 04/19/2023.  Klonopin filled 01/22/2023. I provided 20 minutes of non-face-to-face time during this encounter, including time spent before and after the visit in records review, medical decision making, counseling pertinent to today's visit, and charting.   Sleep hygiene was discussed.  I recommend she take the mirtazapine for a few nights and then switch back to trazodone, hopefully 1 will work better then the other, and tolerance will not be as likely.  She was initially prescribed mirtazapine for akathisia but no longer needs it for that.  No other changes are recommended.  Continue [name] 4 mg, 1 p.o. every morning. Continue Klonopin 0.5 mg, 1 p.o. twice daily prn. Continue Luvox 100 mg, 2 p.o. twice daily.   Continue gabapentin 600 mg, 2 qam.,  1 p.o. q. afternoon, and 2 p.o. nightly.   Continue hydroxyzine 25-50 mg, 1 p.o. 3 times daily as needed. Continue mirtazapine to 7.5 mg, 1 p.o. nightly routinely.   Continue therapy. Return in 6-8 weeks.  Melony Overly, PA-C

## 2023-05-05 DIAGNOSIS — F4310 Post-traumatic stress disorder, unspecified: Secondary | ICD-10-CM | POA: Diagnosis not present

## 2023-05-05 DIAGNOSIS — F429 Obsessive-compulsive disorder, unspecified: Secondary | ICD-10-CM | POA: Diagnosis not present

## 2023-05-05 DIAGNOSIS — F319 Bipolar disorder, unspecified: Secondary | ICD-10-CM | POA: Diagnosis not present

## 2023-05-19 DIAGNOSIS — F319 Bipolar disorder, unspecified: Secondary | ICD-10-CM | POA: Diagnosis not present

## 2023-05-19 DIAGNOSIS — F429 Obsessive-compulsive disorder, unspecified: Secondary | ICD-10-CM | POA: Diagnosis not present

## 2023-05-19 DIAGNOSIS — F4310 Post-traumatic stress disorder, unspecified: Secondary | ICD-10-CM | POA: Diagnosis not present

## 2023-05-24 DIAGNOSIS — F4312 Post-traumatic stress disorder, chronic: Secondary | ICD-10-CM | POA: Diagnosis not present

## 2023-05-26 DIAGNOSIS — F319 Bipolar disorder, unspecified: Secondary | ICD-10-CM | POA: Diagnosis not present

## 2023-05-26 DIAGNOSIS — F4310 Post-traumatic stress disorder, unspecified: Secondary | ICD-10-CM | POA: Diagnosis not present

## 2023-05-26 DIAGNOSIS — F429 Obsessive-compulsive disorder, unspecified: Secondary | ICD-10-CM | POA: Diagnosis not present

## 2023-05-31 DIAGNOSIS — N9089 Other specified noninflammatory disorders of vulva and perineum: Secondary | ICD-10-CM | POA: Diagnosis not present

## 2023-06-01 ENCOUNTER — Telehealth: Payer: Self-pay | Admitting: Physician Assistant

## 2023-06-01 NOTE — Telephone Encounter (Signed)
 Pt called reporting legs been shaking for a while. Husband  noticed has gotten worse. Also, wanted to report about 2 weeks ago almost had a break down. But talked to therapist and has plan in place. Feeling better with that. Advised provider out of office today. RTC [phone]

## 2023-06-02 DIAGNOSIS — F429 Obsessive-compulsive disorder, unspecified: Secondary | ICD-10-CM | POA: Diagnosis not present

## 2023-06-02 DIAGNOSIS — F319 Bipolar disorder, unspecified: Secondary | ICD-10-CM | POA: Diagnosis not present

## 2023-06-02 DIAGNOSIS — F4310 Post-traumatic stress disorder, unspecified: Secondary | ICD-10-CM | POA: Diagnosis not present

## 2023-06-02 NOTE — Telephone Encounter (Signed)
 Pt reported taking trazodone  and only taking mirtazapine  rarely. Will try taking mirtazapine  a few nights in a row and let us  know how she is doing the first of the week.

## 2023-06-08 ENCOUNTER — Telehealth: Payer: Self-pay | Admitting: Physician Assistant

## 2023-06-08 NOTE — Telephone Encounter (Signed)
 Pt LVM  at 2:28p stating that she Mirtazapine  7.5mg  was not helping with her shakes, so she has increased it to 15mg .  She wanted Ammon Bales to know since she will need a  refill earlier.  Next appt 6/9

## 2023-06-09 NOTE — Telephone Encounter (Signed)
 Please see message from patient. She is reporting the 15 mg is working well.  If ok to stay on this dose will send RF when needed.

## 2023-06-09 NOTE — Telephone Encounter (Signed)
 Yes, stay on 15 mg.

## 2023-06-10 DIAGNOSIS — F429 Obsessive-compulsive disorder, unspecified: Secondary | ICD-10-CM | POA: Diagnosis not present

## 2023-06-10 DIAGNOSIS — F319 Bipolar disorder, unspecified: Secondary | ICD-10-CM | POA: Diagnosis not present

## 2023-06-10 DIAGNOSIS — F4310 Post-traumatic stress disorder, unspecified: Secondary | ICD-10-CM | POA: Diagnosis not present

## 2023-06-15 ENCOUNTER — Ambulatory Visit: Payer: Self-pay

## 2023-06-15 NOTE — Telephone Encounter (Signed)
 Pt already Scheduled per protocol on 06/16/2023.    Copied from CRM [id number]. Topic: Clinical - Pink Word Triage >> Jun 15, 2023  9:56 AM Carlatta H wrote: Reason for Triage: Patient has been vomiting all morning//Started with a sore throat//

## 2023-06-15 NOTE — Telephone Encounter (Signed)
 Patient scheduled.

## 2023-06-15 NOTE — Telephone Encounter (Signed)
 Chief Complaint: sore throat Symptoms: sore throat Frequency: x 3 days Pertinent Negatives: Patient denies fever Disposition: [] ED /[] Urgent Care (no appt availability in office) / [x] Appointment(In office/virtual)/ []  [place] [facility]/ [] Home Care/ [] Refused Recommended Disposition /[]  Mobile Bus/ []  Follow-up with PCP Additional Notes: pt states that it started Sunday afternoon with a sore throat and dry heaves. States that yesterday morning she started vomiting. States that this morning she started vomiting again. States that she took a Zofran . States nasal congestion started on Monday. States that nasal drainage going down back of throat. States she took Afrin and mucinex. States that she is able to keep fluids down.    Copied From CRM [id number]. Reason for Triage: Patient has been vomiting all  morning//Started with a sore throat//   Reason for Disposition  [1] Sore throat with cough/cold symptoms AND [2] present > 5 days  Protocols used: Sore Throat-A-AH

## 2023-06-16 ENCOUNTER — Ambulatory Visit

## 2023-06-16 VITALS — BP 117/83 | HR 70 | Ht 64.0 in | Wt 178.1 lb

## 2023-06-16 DIAGNOSIS — J069 Acute upper respiratory infection, unspecified: Secondary | ICD-10-CM

## 2023-06-16 NOTE — Progress Notes (Signed)
   Acute Office Visit  Subjective:     Patient ID: Jennifer [name], female    DOB: Jun 17, 1965, 58 y.o.   MRN: [id number]  Chief Complaint  Patient presents with   Medical Management of Chronic Issues    Pt states "Congestion and sore throat/ vomitting x 3 days and ear pain"    HPI Patient is in today for URI, sore throat and N/V x 3 days.  Patient does report improvement in symptoms today, although not completely resolved.  ROS      Objective:    BP 117/83   Pulse 70   Ht 5\' 4"  (1.626 m)   Wt 178 lb 1.3 oz (80.8 kg)   LMP 10/07/2011   SpO2 97%   BMI 30.57 kg/m    Physical Exam Vitals and nursing note reviewed.  Constitutional:      Appearance: Normal appearance.  HENT:     Head: Normocephalic.     Right Ear: Tympanic membrane, ear canal and external ear normal.     Left Ear: Tympanic membrane, ear canal and external ear normal.     Nose: Nose normal.     Mouth/Throat:     Mouth: Mucous membranes are moist.     Pharynx: Oropharynx is clear. Posterior oropharyngeal erythema present. No oropharyngeal exudate.  Eyes:     Extraocular Movements: Extraocular movements intact.     Pupils: Pupils are equal, round, and reactive to light.  Cardiovascular:     Rate and Rhythm: Normal rate and regular rhythm.  Pulmonary:     Effort: Pulmonary effort is normal.     Breath sounds: Normal breath sounds.  Musculoskeletal:     Cervical back: Normal range of motion and neck supple.  Neurological:     Mental Status: She is alert and oriented to person, place, and time.  Psychiatric:        Mood and Affect: Mood normal.        Thought Content: Thought content normal.     No results found for any visits on 06/16/23.      Assessment & Plan:   Problem List Items Addressed This Visit   None Visit Diagnoses       URI, acute    -  Primary   Likely viral in nature.  Continue with over-the-counter medications and crease fluid intake.  Also recommend saline nasal spray for  congestion.       No orders of the defined types were placed in this encounter.   No follow-ups on file.  Alison Irvine, FNP

## 2023-06-23 DIAGNOSIS — F319 Bipolar disorder, unspecified: Secondary | ICD-10-CM | POA: Diagnosis not present

## 2023-06-23 DIAGNOSIS — F429 Obsessive-compulsive disorder, unspecified: Secondary | ICD-10-CM | POA: Diagnosis not present

## 2023-06-23 DIAGNOSIS — F4310 Post-traumatic stress disorder, unspecified: Secondary | ICD-10-CM | POA: Diagnosis not present

## 2023-06-28 ENCOUNTER — Encounter: Payer: Self-pay | Admitting: Physician Assistant

## 2023-06-28 ENCOUNTER — Ambulatory Visit: Admitting: Physician Assistant

## 2023-06-28 DIAGNOSIS — G2581 Restless legs syndrome: Secondary | ICD-10-CM | POA: Diagnosis not present

## 2023-06-28 DIAGNOSIS — F5105 Insomnia due to other mental disorder: Secondary | ICD-10-CM

## 2023-06-28 DIAGNOSIS — F411 Generalized anxiety disorder: Secondary | ICD-10-CM

## 2023-06-28 DIAGNOSIS — F429 Obsessive-compulsive disorder, unspecified: Secondary | ICD-10-CM | POA: Diagnosis not present

## 2023-06-28 DIAGNOSIS — F99 Mental disorder, not otherwise specified: Secondary | ICD-10-CM | POA: Diagnosis not present

## 2023-06-28 DIAGNOSIS — F319 Bipolar disorder, unspecified: Secondary | ICD-10-CM | POA: Diagnosis not present

## 2023-06-28 MED ORDER — MIRTAZAPINE 15 MG PO TABS: 15.0000 mg | ORAL_TABLET | Freq: Every day | ORAL | 1 refills | Status: DC

## 2023-06-28 MED ORDER — CLONAZEPAM 0.5 MG PO TABS: 0.5000 mg | ORAL_TABLET | Freq: Three times a day (TID) | ORAL | 1 refills | Status: DC | PRN

## 2023-06-28 MED ORDER — FLUVOXAMINE MALEATE 100 MG PO TABS: 200.0000 mg | ORAL_TABLET | Freq: Two times a day (BID) | ORAL | 3 refills | Status: AC

## 2023-06-28 NOTE — Progress Notes (Signed)
 [facility]  Patient ID: Jennifer [name],  MRN: [id number]  PCP: Meldon Sport, MD  Date of Evaluation: 06/28/2023 time spent:35 minutes  Chief Complaint:   Chief Complaint   Depression; Anxiety; Insomnia; Follow-up    HISTORY/CURRENT STATUS: HPI for routine med check  We increase the mirtazapine  few weeks ago because she was having more "shaking" of her legs, more so on the right than the left.  It is not happening so much at night but more so during the day.  The increase of mirtazapine  has been really helpful.  It also helps her sleep some but she still wakes up almost every night.  She can sometimes lay awake for 45 minutes to 1 hour before she is able to go back to sleep.  She does feel rested when she gets up though.  She has been taking trazodone  some nights but still wakes up.  She does not take both the mirtazapine  and trazodone  together.  It is only 1 or the other.  Patient is able to enjoy things.  Energy and motivation are good.   No extreme sadness, tearfulness, or feelings of hopelessness.  ADLs and personal hygiene are normal.   Denies any changes in concentration, making decisions, or remembering things.  She still binges and purges some.  States there is no rhyme or reason to it.  She is not losing weight from doing it.  In counseling concerning the eating disorder.  She does get anxious in general sometimes, no triggers unless she is having to drive somewhere.  The Klonopin  is effective when needed.  Denies suicidal or homicidal thoughts.  Patient denies increased energy with decreased need for sleep, increased talkativeness, racing thoughts, impulsivity or risky behaviors, increased spending, increased libido, grandiosity, increased irritability or anger, paranoia, or hallucinations.  Denies dizziness, syncope, seizures, numbness, tingling, tremor, tics, unsteady gait, slurred speech, confusion. Denies muscle or joint pain, stiffness, or dystonia. Denies  unexplained weight loss, frequent infections, or sores that heal slowly.  No polyphagia, polydipsia, or polyuria. Denies visual changes or paresthesias.   Individual Medical History/ Review of Systems: Changes? :No        Past medications for mental health diagnoses include: Risperdal, Seroquel, Prozac, Zoloft,  Wellbutrin, Lamictal , Depakote  caused hair loss, Xanax , Ambien , trazodone , Trileptal, Luvox , Topamax, Elavil, Pamelor, BuSpar, doxazosin, prazosin, lithium, Vraylar , Abilify  caused increased appetite, caused weight gain which is contraindicated w/ her eating d/o, and didn't work for sx, [name] , Latuda  >60 mg caused abd pain and nausea, propranolol  caused diarrhea.  Allergies: Codeine, Penicillins, Depakote  [divalproex  sodium], Mobic [meloxicam], Sonata [zaleplon], Topamax [topiramate], Aciphex  [rabeprazole ], Aimovig  [erenumab -aooe], and Emgality [galcanezumab-gnlm]  Current Medications:  Current Outpatient Medications:    albuterol  (VENTOLIN  HFA) 108 (90 Base) MCG/ACT inhaler, Inhale 2 puffs into the lungs every 6 (six) hours as needed for wheezing or shortness of breath., Disp: 18 g, Rfl: 5   Brexpiprazole  ([name] ) 4 MG TABS, Take 1 tablet (4 mg total) by mouth in the morning., Disp: 30 tablet, Rfl: 11   esomeprazole  (NEXIUM ) 40 MG capsule, Take 1 capsule (40 mg total) by mouth daily., Disp: 90 capsule, Rfl: 1   Famotidine -Ca Carb-Mag Hydrox (PEPCID  COMPLETE PO), Take 1 tablet by mouth in the morning., Disp: , Rfl:    fluticasone  (FLONASE ) 50 MCG/ACT nasal spray, Place 2 sprays into both nostrils daily., Disp: 16 g, Rfl: 6   gabapentin  (NEURONTIN ) 600 MG tablet, 2 p.o. every morning, 1 p.o. q. afternoon, 2 p.o. nightly., Disp: 150 tablet,  Rfl: 11   hydrOXYzine  (ATARAX ) 25 MG tablet, Take 1-2 tablets (25-50 mg total) by mouth every 8 (eight) hours as needed., Disp: 90 tablet, Rfl: 11   hyoscyamine  (LEVSIN/SL) 0.125 MG SL tablet, Place 1 tablet (0.125 mg total) under the tongue every 4  (four) hours as needed (for abdominal cramping/pain)., Disp: 60 tablet, Rfl: 1   loratadine (CLARITIN) 10 MG tablet, Take 10 mg by mouth in the morning and at bedtime. Morning & afternoon., Disp: , Rfl:    mirtazapine  (REMERON ) 15 MG tablet, Take 1 tablet (15 mg total) by mouth at bedtime., Disp: 90 tablet, Rfl: 1   naproxen  sodium (ALEVE ) 220 MG tablet, Take 660 mg by mouth daily as needed (migraine)., Disp: , Rfl:    ondansetron  (ZOFRAN -ODT) 4 MG disintegrating tablet, Take 1 tablet (4 mg total) by mouth every 8 (eight) hours as needed for nausea or vomiting., Disp: 30 tablet, Rfl: 0   Probiotic Product (DAILY PROBIOTIC PO), Take 2 capsules by mouth daily with lunch., Disp: , Rfl:    RESTASIS  0.05 % ophthalmic emulsion, Place 1 drop into both eyes 2 (two) times daily., Disp: , Rfl:    sucralfate  (CARAFATE ) 1 g tablet, Take 1 g slurry around meals and at bedtime as needed for nausea and epigastric pain., Disp: 120 tablet, Rfl: 3   traZODone  (DESYREL ) 100 MG tablet, Take 1-2 tablets (100-200 mg total) by mouth at bedtime as needed for sleep. (Patient taking differently: Take 100-200 mg by mouth at bedtime as needed for sleep. Takes this OR mirtazapine ), Disp: 60 tablet, Rfl: 11   Ubrogepant  (UBRELVY ) 100 MG TABS, Take 100 mg by mouth with or without food. If needed, a second dose may be taken at least 2 hours after the initial dose. The maximum dose in a 24-hour period is 200 mg., Disp: 10 tablet, Rfl: 11   valACYclovir  (VALTREX ) 500 MG tablet, Take 500 mg by mouth daily at 12 noon., Disp: , Rfl:    clonazePAM  (KLONOPIN ) 0.5 MG tablet, Take 1 tablet (0.5 mg total) by mouth 3 (three) times daily as needed for anxiety., Disp: 30 tablet, Rfl: 1   clotrimazole -betamethasone  (LOTRISONE ) cream, Apply 1 Application topically daily. (Patient not taking: Reported on 06/28/2023), Disp: 30 g, Rfl: 0   fluvoxaMINE  (LUVOX ) 100 MG tablet, Take 2 tablets (200 mg total) by mouth 2 (two) times daily., Disp: 360 tablet,  Rfl: 3 Medication Side Effects: none  Family Medical/ Social History: Changes?  No  MENTAL HEALTH EXAM:  Last menstrual period 10/07/2011.There is no height or weight on file to calculate BMI.  General Appearance: Casual and Well Groomed  Eye Contact:  Good  Speech:  Clear and Coherent and Normal Rate  Volume:  Normal  Mood:  Euthymic  Affect:  Congruent  Thought Process:  Goal Directed and Descriptions of Associations: Circumstantial  Orientation:  Full (Time, Place, and Person)  Thought Content: Logical   Suicidal Thoughts:  No  Homicidal Thoughts:  No  Memory:  WNL  Judgement:  Good  Insight:  Good  Psychomotor Activity:  Occasionally shakes her right leg  Concentration:  Concentration: Good and Attention Span: Good  Recall:  Good  Fund of Knowledge: Good  Language: Good  Assets:  Desire for Improvement Financial Resources/Insurance Housing Leisure Time Resilience Transportation  ADL's:  Intact  Cognition: WNL  Prognosis:  Good   Labs 03/23/2023 CBC with differential normal CMP glucose 109, otherwise normal  Last hemoglobin A1c was 08/04/2022 results 5.6 Last lipid panel  was 08/04/2022, completely normal  DIAGNOSES:    ICD-10-CM   1. Bipolar I disorder (HCC)  F31.9     2. Obsessive-compulsive disorder, unspecified type  F42.9     3. Generalized anxiety disorder  F41.1     4. Insomnia due to other mental disorder  F51.05    F99     5. Restless leg syndrome  G25.81       Receiving Psychotherapy: Yes  Jennifer [name], LCMSW at [facility], Jennifer [name] for brain spotting  RECOMMENDATIONS:  PDMP was reviewed.  Gabapentin  filled 05/22/2023.  Klonopin  filled 01/22/2023. I provided 35 minutes of face to face time during this encounter, including time spent before and after the visit in records review, medical decision making, counseling pertinent to today's visit, and charting.   Sleep hygiene was discussed, counseling for approximately 18 minutes.   She knows to take the mirtazapine  or trazodone  but not both the same night.  The mirtazapine  is also helping the restless legs symptoms so it would be best for her to stay on that every night.  If she does not sleep well for a couple of nights then she can switch mirtazapine  to trazodone  for a few nights.  She verbalizes understanding.  Continue [name]  4 mg, 1 p.o. every morning. Continue Klonopin  0.5 mg, 1 p.o. twice daily prn. Continue Luvox  100 mg, 2 p.o. twice daily.   Continue gabapentin  600 mg, 2 qam.,  1 p.o. q. afternoon, and 2 p.o. nightly.   Continue hydroxyzine  25-50 mg, 1 p.o. 3 times daily as needed. Continue mirtazapine  15 mg p.o. nightly. Continue trazodone  100 mg, 1-2 nightly as needed sleep.  Do not take if she is taking the mirtazapine . Continue therapy. Return in 3 months.  Jennifer Slocumb, PA-C

## 2023-07-05 ENCOUNTER — Encounter: Payer: Self-pay | Admitting: Cardiovascular Disease

## 2023-07-05 ENCOUNTER — Ambulatory Visit: Attending: Cardiovascular Disease | Admitting: Cardiovascular Disease

## 2023-07-05 VITALS — BP 134/80 | HR 65 | Ht 64.0 in | Wt 181.4 lb

## 2023-07-05 DIAGNOSIS — I471 Supraventricular tachycardia, unspecified: Secondary | ICD-10-CM

## 2023-07-05 NOTE — Patient Instructions (Signed)
 Medication Instructions:  Your physician recommends that you continue on your current medications as directed. Please refer to the Current Medication list given to you today. *If you need a refill on your cardiac medications before your next appointment, please call your pharmacy*   Follow-Up: At [facility], you and your health needs are our priority.  As part of our continuing mission to provide you with exceptional heart care, our providers are all part of one team.  This team includes your primary Cardiologist (physician) and Advanced Practice Providers or APPs (Physician Assistants and Nurse Practitioners) who all work together to provide you with the care you need, when you need it.  Your next appointment:   Follow up as needed with   Provider:   Marlane Silver, MD

## 2023-07-05 NOTE — Progress Notes (Signed)
 Cardiology Office Note:    Date:  07/05/2023   ID:  Jennifer [name], DOB 1965-08-03, MRN [id number]  PCP:  Meldon Sport, MD   [facility] Providers Cardiologist:  None Electrophysiologist:  Efraim Grange, MD     Referring MD: Meldon Sport, MD   Chief complaint: fast heart rhythm  History of Present Illness:    Jennifer [name] is a 58 y.o. female with a hx of SVT, PVD, Bipolar disorder, anxiety, depression, bulemia.  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  A colleague checked her pulse and noted to be over 200 beats a minute.  The arrhythmia had resolved prior to reaching the ER.  She has continued to have episodes lasting a few minutes a few times a year.  In August, she had sudden onset of palpitations. She went to an urgent care where an ECG was obtained showing a short R-P tachycardia at 134 bpm (see scan 09/11/2021) She was transferred to the [facility] ER but upon arrival she had converted to sinus rhythm. She was given diltiazem . She didn't tolerate this well and was switched to metoprolol .  She underwent EP study in October 2023. 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 She has been doing well since.  She recently had an episode of heart racing after taking a supplement called Beneve that contains stimulants and is marketed as an Therapist, art.  Outside of this, she has not had palpitations consistent with SVT  She denies cheat pain, shortness of breath, ankle edema.  Arrhythmia History: First symptoms of tachycardia: about 7 years ago SVT diagnosed 09/11/2021 by ECG     EKGs/Labs/Other Studies Reviewed:    The following studies were reviewed today:  TTE 09/11/2021  1. Left ventricular ejection fraction, by estimation, is 65 to 70%. The left ventricle has normal function. The left ventricle has no regional wall motion abnormalities. Left ventricular diastolic parameters were normal.   2. Right  ventricular systolic function is normal. The right ventricular size is normal. There is normal pulmonary artery systolic pressure.   3. Left atrial size was mildly dilated.   4. The mitral valve is normal in structure. No evidence of mitral valve regurgitation. No evidence of mitral stenosis.   5. The aortic valve has an indeterminant number of cusps. Aortic valve regurgitation is not visualized. No aortic stenosis is present.   6. The inferior vena cava is normal in size with greater than 50%  respiratory variability, suggesting right atrial pressure of 3 mmHg.   Event Monitor 04/29/2014: Normal rhythm and rate distribution  EKG:          Orders placed or performed during the hospital encounter of 01/12/22   EKG 12-Lead   EKG 12-Lead   ED EKG   ED EKG     Recent Labs: 08/04/2022: TSH 2.220 03/23/2023: ALT 20; BUN 13; Creatinine, Ser 0.89; Hemoglobin 14.8; Platelets 235; Potassium 4.3; Sodium 142     Physical Exam:    VS:  BP 134/80   Pulse 65   Ht 5' 4 (1.626 m)   Wt 181 lb 6.4 oz (82.3 kg)   LMP 10/07/2011   SpO2 99%   BMI 31.14 kg/m     Wt Readings from Last 3 Encounters:  07/05/23 181 lb 6.4 oz (82.3 kg)  06/16/23 178 lb 1.3 oz (80.8 kg)  04/21/23 178 lb (80.7 kg)     GEN:  Well nourished, well developed in no acute distress CARDIAC: RRR, no murmurs, rubs, gallops RESPIRATORY:  Normal work of breathing MUSCULOSKELETAL: no edema    ASSESSMENT & PLAN:    In order of problems listed above:  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 She has not since had symptoms consistent with recurrence of SVT.  At this point, we may dismiss her from electrophysiology clinic.  She may follow-up if her symptoms return or new arrhythmia symptoms develop.       Medication Adjustments/Labs and Tests Ordered: Current medicines are reviewed at length with the patient today.  Concerns regarding medicines are outlined above.  No orders of the defined types were placed in this  encounter.  No orders of the defined types were placed in this encounter.    Signed, Efraim Grange, MD  07/05/2023 11:53 AM    [facility]

## 2023-07-07 DIAGNOSIS — F429 Obsessive-compulsive disorder, unspecified: Secondary | ICD-10-CM | POA: Diagnosis not present

## 2023-07-07 DIAGNOSIS — F319 Bipolar disorder, unspecified: Secondary | ICD-10-CM | POA: Diagnosis not present

## 2023-07-07 DIAGNOSIS — F4310 Post-traumatic stress disorder, unspecified: Secondary | ICD-10-CM | POA: Diagnosis not present

## 2023-07-14 DIAGNOSIS — F4310 Post-traumatic stress disorder, unspecified: Secondary | ICD-10-CM | POA: Diagnosis not present

## 2023-07-14 DIAGNOSIS — F429 Obsessive-compulsive disorder, unspecified: Secondary | ICD-10-CM | POA: Diagnosis not present

## 2023-07-14 DIAGNOSIS — F319 Bipolar disorder, unspecified: Secondary | ICD-10-CM | POA: Diagnosis not present

## 2023-07-21 DIAGNOSIS — F319 Bipolar disorder, unspecified: Secondary | ICD-10-CM | POA: Diagnosis not present

## 2023-07-21 DIAGNOSIS — F4310 Post-traumatic stress disorder, unspecified: Secondary | ICD-10-CM | POA: Diagnosis not present

## 2023-07-21 DIAGNOSIS — F429 Obsessive-compulsive disorder, unspecified: Secondary | ICD-10-CM | POA: Diagnosis not present

## 2023-07-29 DIAGNOSIS — F4310 Post-traumatic stress disorder, unspecified: Secondary | ICD-10-CM | POA: Diagnosis not present

## 2023-07-29 DIAGNOSIS — F319 Bipolar disorder, unspecified: Secondary | ICD-10-CM | POA: Diagnosis not present

## 2023-07-29 DIAGNOSIS — F429 Obsessive-compulsive disorder, unspecified: Secondary | ICD-10-CM | POA: Diagnosis not present

## 2023-08-04 DIAGNOSIS — F429 Obsessive-compulsive disorder, unspecified: Secondary | ICD-10-CM | POA: Diagnosis not present

## 2023-08-04 DIAGNOSIS — F319 Bipolar disorder, unspecified: Secondary | ICD-10-CM | POA: Diagnosis not present

## 2023-08-04 DIAGNOSIS — F4310 Post-traumatic stress disorder, unspecified: Secondary | ICD-10-CM | POA: Diagnosis not present

## 2023-08-11 DIAGNOSIS — F319 Bipolar disorder, unspecified: Secondary | ICD-10-CM | POA: Diagnosis not present

## 2023-08-11 DIAGNOSIS — F429 Obsessive-compulsive disorder, unspecified: Secondary | ICD-10-CM | POA: Diagnosis not present

## 2023-08-11 DIAGNOSIS — F4310 Post-traumatic stress disorder, unspecified: Secondary | ICD-10-CM | POA: Diagnosis not present

## 2023-08-16 ENCOUNTER — Encounter: Payer: Medicare PPO | Admitting: Internal Medicine

## 2023-08-17 ENCOUNTER — Encounter: Payer: Self-pay | Admitting: Internal Medicine

## 2023-08-17 NOTE — Progress Notes (Unsigned)
 GI Office Note    Referring Provider: Tobie Suzzane POUR, MD Primary Care Physician:  Tobie Suzzane POUR, MD Primary Gastroenterologist: Lamar HERO.Rourk, MD  Date:  08/19/2023  ID:  Jennifer [name], DOB 1965-07-28, MRN [id number]  Chief Complaint   Chief Complaint  Patient presents with   Follow-up    States that diarrhea has returned about a week and a half ago, went back on colestipol  and it is working.    History of Present Illness  Jennifer [name] is a 58 y.o. female with a history of bulimia, GERD, anxiety, bipolar 1 depression, arthritis, fatty liver, and IBS-D/chronic diarrhea presenting today with complaint of recurrent diarrhea.   Colonoscopy September 2021: -2 sigmoid polyps -Advised repeat in 10 years   Clinic visit in April 2023 with complaint of diarrhea.  Having 3-4 loose stools per day, small to moderate amounts.  Also with incomplete defecation and occasional urgency.  Denies nocturnal stools.  Abdominal pain relieved with defecation.  Pain primarily periumbilical region and lower abdomen.  Diarrhea made worse with eating.  Had good relief with over-the-counter Imodium .  Negative C. difficile, Giardia.  GI pathogen panel was positive for norovirus.   PCP visit in July again noting chronic diarrhea.  Had some improvement with Bentyl  fiber, and probiotic.  Also reported chronic nausea due to bulimia.  Started on cholestyramine  4 g twice daily.   Visit on 10/01/2021, virtual follow-up.  Imodium  remained helpful with diarrhea however reported more abdominal pain.  Cholestyramine  also somewhat helpful however difficult with twice daily dosing as she reported all contents do not seem to resolve.  Still having urgency with varying bowel habits.  Sometimes 8-9 stools per day and varying amounts.  Denied nausea, melena, BRBPR, fever, or chills.  Reports scattered bulimic episodes.  Advised Colestid  4 g 2-3 times daily.  Advised to continue Levsin  for abdominal pain.  Use Imodium  prior to  meals if persistent symptoms.  Advised to keep a food and symptom log.  Potential to try Xifaxan in the future if symptoms continue.   She underwent cardiac ablation in October given symptomatic SVT.  Ablation was successful and her metoprolol  was discontinued.  She was advised to follow-up in 1 year.    Office visit 12/22/21. Having a bad migraine that day.  Zofran  somewhat helping.  Continue to have issues with purging and continue to see psychiatry and therapy.  Mostly having dry heaves occurring in the mornings most of the week lasting 10 to 15 minutes at a time.  Improves if she lays back down.  Continues to have occasional abdominal pain with diarrhea however felt as though Colestid  was working well for her.  Taking it mostly once daily, at times she takes it twice a day it may not be as easy for her to go.  Continues to admit to improvement of bloating if she does not purge.  Advised to continue dicyclomine  20 mg as needed.  Zofran  refilled.  Advised to continue Colestid  4 g once to twice daily for diarrhea, also refilled.  Continue to advise famotidine  daily and increase to twice daily if needed, continue follow-up with therapy for bulimia/purging, and to avoid lactose products.   OV 03/10/22.  Probiotic which helped improve her diarrhea therefore she stopped her colestipol  and then had return of diarrhea.  Diarrhea was causing redness inflammation on her perineum.  Nausea improved, only having dry heaves occasionally.  Has limiting Zofran  is severe and she needs it.  Did report some  recent purging associated with abdominal spasms.  Continuing to undergo weekly therapy and brain spotting for bulimia and bipolar disorder.  At improvement abdominal pain since starting probiotic.  Not using dicyclomine  much at all.  Advised Desitin as needed for perineal irritation.  Advised to resume colestipol  4 g once to twice daily.  Continue dicyclomine  20 mg as needed.  Continue famotidine  once daily and Zofran  as  needed.  Avoid lactose.   Patient sent in a message at the end of February reporting stomach upset/pain.  Continues to have purging.  Tried dicyclomine  1 night and Levsin  management.  Consider short course of Carafate  for 2 weeks.  Advised to increase famotidine  to twice daily.   Received notification the patient recently went to the ED at the beginning of March for abdominal pain and vomiting.  Reinforced the need to stop all NSAIDs.  Advised that she may need possible repeat EGD.  Nexium  was sent in given intolerance to pantoprazole  in the past due to severe diarrhea.   CT A/P 03/25/2022: No acute abnormality in the abdomen or pelvis, moderate hiatal hernia.   OV 04/21/2022.  Patient having epigastric pain more with sitting up in the morning down.  Has some relief with Mylanta.  Tried to have a hamburger however had upset stomach and pain shortly after followed by increased nausea.  Had not been taking pantoprazole , only famotidine  twice daily.  After her ED visit she has started on Nexium .  Denied any dysphagia.  Diarrhea controlled with colestipol  and dicyclomine  as needed.  Reportedly to recent purging episodes. Scheduled for EGD.  Advised to avoid NSAIDs, follow GERD diet.  Continue Nexium  20 mg once daily and famotidine  20 mg twice daily.  Continue colestipol  4 g once daily and dicyclomine  up to 3 times daily as needed.   EGD 05/20/2022: -Normal esophagus -Medium size hiatal hernia -Normal duodenum -Advise continue current medications -Advised Carafate  suspension 1 g before meals and at bedtime as needed for pain and nausea   OV 09/08/22.  Reportedly still purging but continue to follow with dietitian and behavioral health.  Using an app to help track her meals as well as her feelings and thoughts.  Continues to have some epigastric pain with purging.  Having some loose stools as well as some constipation.  Taking colestipol  as needed, has needed up to 8 tablets/day in the past.  Having some  intermittent urgency as well.  Taking Nexium  20 mg once daily over-the-counter and Carafate  as needed.  Taking dicyclomine  as well as needed however limits her use given insomnia.  Overall feeling better.  Advised to continue Nexium , Carafate .  Continue colestipol  and dicyclomine  as needed.  Continue to follow GERD diet and following with behavioral health in regards to bulimia.  OV 03/09/23 with me.  Still using dicyclomine  as needed given reports of insomnia.  Had only used it about 5 times in the last several months prior.  Taking Carafate  about 3 times per week.  Continuing to have some intermittent epigastric pain and fullness which prompts her to take Carafate .  A lot of times happens after meals but reports she has been eating less.  Continue to follow with behavioral health for her brain spotting and for treatment of her bulimia.  Having purging episodes about 2-3 times per week.  Had stopped colestipol  given she was not really having any diarrhea.  Still struggling with some vaginal discharge and inflammation issues which she was seeing GYN for.  Most days having 1-2 bowel  movements a day, occasionally skipping a day.  Advised to continue Nexium  and increase to 40 mg once daily.  Continue Carafate  as needed and use colestipol  4 g as needed along with dicyclomine  and Zofran .  Last office visit 03/17/23 with Sonny Kerns.  Continuing to work at limiting her purging.  Had gotten down to only 2 episodes per week and usually occurring at nighttime.  Have been trying to focus on distractions when she feels the urge to purge.  Shortly after her last office visit she began having significant upper abdominal pain and had 3 episodes of this in the past and can last for hours at a time.  Tried Tums/Mylanta and laying down and eventually settles down.  No bowel movement with the pain.  Reported epigastric pain was resolving and was just sore.  Having occasional toilet tissue hematochezia.  She noted that her  epigastric pain seem to be triggered by anxiety/stress.  Continuing to not take dicyclomine  very often due to his insomnia.  She was given Levsin  to use in place of dicyclomine .  Labs ordered.  Advised continue Nexium  and to work toward goals of no purging at all.  CBC, CMP, and lipase all within normal limits other than mildly elevated glucose in March 2024.   Patient notified us  that on 7/14 she started back on colestipol  everyday due to severe diarrhea.   Today:  Discussed the use of AI scribe software for clinical note transcription with the patient, who gave verbal consent to proceed.  She experiences severe chest pain, described as the worst she has ever had, occurring all day on the past Sunday and recurring last night. The pain is associated with episodes of bulimia, particularly after vomiting, and has become more frequent and severe. She considered going to the hospital but did not due to the late hour and her husband's work schedule.  She has a history of supraventricular tachycardia (SVT) and recently experienced a racing heartbeat without chest pain. She managed the episode by lying down and hydrating. She was previously dismissed from cardiology for her SVT but was advised to return if problems persisted.  Her bulimia is problematic, with episodes of vomiting leading to chest pain. Vomiting is sometimes uncontrollable, with spasms in her stomach causing her to throw up bile and undigested food. These episodes have recently become more frequent and severe.  She experiences diarrhea, which she attributes to stress. A recent episode was so severe that she could not attend a therapy appointment. She manages her diarrhea with cholestyramine  tablets, taking two grams every other day, which she finds effective in stabilizing her symptoms.  She uses Levsin  and sucralfate  for gastrointestinal discomfort, with Levsin  helping her insomnia and sucralfate  providing relief for stomach pain. She  also takes Nexium  for reflux, noting that over-the-counter alternatives have been less effective.  She mentions stress related to her husband's upcoming prostate biopsy and family dynamics, which she believes exacerbate her symptoms. She also reports a history of inflammation in the genital area, for which she uses a prescribed clear ointment that has provided some relief.  No blood in vomit but notes occasional red streaks on toilet paper after bowel movements. No black stools.  Wt Readings from Last 5 Encounters:  08/19/23 180 lb (81.6 kg)  07/05/23 181 lb 6.4 oz (82.3 kg)  06/16/23 178 lb 1.3 oz (80.8 kg)  04/21/23 178 lb (80.7 kg)  03/17/23 179 lb 6.4 oz (81.4 kg)    Current Outpatient Medications  Medication Sig  Dispense Refill   albuterol  (VENTOLIN  HFA) 108 (90 Base) MCG/ACT inhaler Inhale 2 puffs into the lungs every 6 (six) hours as needed for wheezing or shortness of breath. 18 g 5   Brexpiprazole  ([name] ) 4 MG TABS Take 1 tablet (4 mg total) by mouth in the morning. 30 tablet 11   clonazePAM  (KLONOPIN ) 0.5 MG tablet Take 1 tablet (0.5 mg total) by mouth 3 (three) times daily as needed for anxiety. 30 tablet 1   colestipol  (COLESTID ) 1 g tablet Take 2 g by mouth every other day.     Famotidine -Ca Carb-Mag Hydrox (PEPCID  COMPLETE PO) Take 1 tablet by mouth in the morning.     fluticasone  (FLONASE ) 50 MCG/ACT nasal spray Place 2 sprays into both nostrils daily. 16 g 6   fluvoxaMINE  (LUVOX ) 100 MG tablet Take 2 tablets (200 mg total) by mouth 2 (two) times daily. 360 tablet 3   gabapentin  (NEURONTIN ) 600 MG tablet 2 p.o. every morning, 1 p.o. q. afternoon, 2 p.o. nightly. 150 tablet 11   hydrOXYzine  (ATARAX ) 25 MG tablet Take 1-2 tablets (25-50 mg total) by mouth every 8 (eight) hours as needed. 90 tablet 11   loratadine (CLARITIN) 10 MG tablet Take 10 mg by mouth in the morning and at bedtime. Morning & afternoon.     mirtazapine  (REMERON ) 15 MG tablet Take 1 tablet (15 mg total) by  mouth at bedtime. 90 tablet 1   naproxen  sodium (ALEVE ) 220 MG tablet Take 660 mg by mouth daily as needed (migraine).     ondansetron  (ZOFRAN -ODT) 4 MG disintegrating tablet Take 1 tablet (4 mg total) by mouth every 8 (eight) hours as needed for nausea or vomiting. 30 tablet 0   Probiotic Product (DAILY PROBIOTIC PO) Take 2 capsules by mouth daily with lunch.     RESTASIS  0.05 % ophthalmic emulsion Place 1 drop into both eyes 2 (two) times daily.     traZODone  (DESYREL ) 100 MG tablet Take 1-2 tablets (100-200 mg total) by mouth at bedtime as needed for sleep. (Patient taking differently: Take 100-200 mg by mouth at bedtime as needed for sleep. Takes this OR mirtazapine ) 60 tablet 11   triamcinolone  ointment (KENALOG ) 0.1 % Apply 1 Application topically 2 (two) times daily.     Ubrogepant  (UBRELVY ) 100 MG TABS Take 100 mg by mouth with or without food. If needed, a second dose may be taken at least 2 hours after the initial dose. The maximum dose in a 24-hour period is 200 mg. 10 tablet 11   valACYclovir  (VALTREX ) 500 MG tablet Take 500 mg by mouth daily at 12 noon.     esomeprazole  (NEXIUM ) 40 MG capsule Take 1 capsule (40 mg total) by mouth daily. 90 capsule 3   hyoscyamine  (LEVSIN /SL) 0.125 MG SL tablet Place 1 tablet (0.125 mg total) under the tongue every 4 (four) hours as needed (for abdominal cramping/pain). 60 tablet 1   sucralfate  (CARAFATE ) 1 g tablet Take 1 g slurry around meals and at bedtime as needed for nausea and epigastric pain. 120 tablet 3   No current facility-administered medications for this visit.    Past Medical History:  Diagnosis Date   Abdominal pain, epigastric 10/07/2018   Acute non-recurrent maxillary sinusitis 11/07/2019   Allergy  10/20/2018   Runny nose   Anxiety    Arthritis    Binge-eating and purging type anorexia nervosa    Bipolar affective disorder (HCC) 12/04/2015   Bipolar affective disorder, current episode manic without psychotic symptoms (HCC)  12/04/2015   Bipolar disorder (HCC)    Cellulitis and abscess of buttock 02/15/2013   Chronic kidney disease 2020   Growths detected on kidneys when had ultrasound 3 or 4 years ago.   Cystitis, interstitial    Depression    Depression    Phreesia 01/28/2020   Dysrhythmia    Eczema    Emphysema lung (HCC)    Emphysema of lung (HCC) 2022   Esophageal polyp    about 20 years ago   GERD (gastroesophageal reflux disease)    Hypertension    Left wrist pain 03/22/2019   Neuromuscular disorder (HCC) 2019   OCD (obsessive compulsive disorder)    Palpitations 05/25/2014   Rectal pain 12/06/2018   Recurrent upper respiratory infection (URI)    Rib pain on right side 01/31/2019   Rosacea 03/27/2019   Sprain of temporomandibular joint or ligament 01/10/2019   Tobacco abuse 12/17/2021   Ulcer Doctor Rourk   Stomach    Past Surgical History:  Procedure Laterality Date   BLADDER SURGERY     CHOLECYSTECTOMY     COLONOSCOPY WITH PROPOFOL  N/A 10/05/2019   Procedure: COLONOSCOPY WITH PROPOFOL ;  Surgeon: Shaaron Lamar HERO, MD;  Location: [facility];  Service: Endoscopy;  Laterality: N/A;  12:45pm   COSMETIC SURGERY N/A    Phreesia 01/28/2020   ESOPHAGOGASTRODUODENOSCOPY  09/28/2016   [facility]; Dr. Saintclair; erosions in the esophagus, 5 cm hiatal hernia, nonbleeding erosive gastropathy s/p biopsied, normal duodenum.  Path with chronic inactive gastritis, no H. pylori or intestinal metaplasia.    ESOPHAGOGASTRODUODENOSCOPY (EGD) WITH PROPOFOL  N/A 10/17/2018   Dr. Shaaron: mild reflux esophagitis, small hiatal hernia   ESOPHAGOGASTRODUODENOSCOPY (EGD) WITH PROPOFOL  N/A 05/20/2022   Procedure: ESOPHAGOGASTRODUODENOSCOPY (EGD) WITH PROPOFOL ;  Surgeon: Shaaron Lamar HERO, MD;  Location: [facility];  Service: Endoscopy;  Laterality: N/A;  7:30am;ASA 3   POLYPECTOMY  10/05/2019   Procedure: POLYPECTOMY;  Surgeon: Shaaron Lamar HERO, MD;  Location: [facility];  Service: Endoscopy;;   SVT ABLATION N/A  10/31/2021   Procedure: SVT ABLATION;  Surgeon: Nancey Eulas BRAVO, MD;  Location: [facility];  Service: Cardiovascular;  Laterality: N/A;   VOCAL CORD LATERALIZATION, ENDOSCOPIC APPROACH W/ MLB     vocal cord nodule removed      Family History  Problem Relation Age of Onset   Healthy Mother    Arthritis Mother    Varicose Veins Mother    Healthy Father    Alcohol abuse Father    Anxiety disorder Father    Depression Father    Early death Father    Migraines Paternal Grandmother    Arthritis Paternal Grandmother    Heart disease Paternal Grandmother    Stroke Paternal Grandfather    Colon cancer Neg Hx    Colon polyps Neg Hx     Allergies as of 08/19/2023 - Review Complete 08/19/2023  Allergen Reaction Noted   Codeine Shortness Of Breath, Nausea Only, and Rash 02/15/2013   Penicillins Hives, Rash, Shortness Of Breath, and Swelling 10/09/2011   Depakote  [divalproex  sodium] Hives, Itching, and Rash 01/28/2020   Mobic [meloxicam] Nausea And Vomiting and Other (See Comments) 05/08/2014   Sonata [zaleplon] Other (See Comments) 10/22/2017   Topamax [topiramate] Other (See Comments) 10/04/2018   Aciphex  [rabeprazole ] Swelling 07/16/2020   Aimovig  [erenumab -aooe] Itching 06/26/2019   Emgality [galcanezumab-gnlm] Hives and Rash 10/04/2018    Social History   Socioeconomic History   Marital status: Married    Spouse name:  Todd   Number of children: 0   Years of education: Not on file   Highest education level: Bachelor's degree (e.g., BA, AB, BS)  Occupational History   Not on file  Tobacco Use   Smoking status: Former    Current packs/day: 0.00    Average packs/day: 1 pack/day for 33.8 years (33.8 ttl pk-yrs)    Types: Cigarettes    Start date: 01/20/1988    Quit date: 11/12/2021    Years since quitting: 1.7   Smokeless tobacco: Never  Vaping Use   Vaping status: Never Used  Substance and Sexual Activity   Alcohol use: No   Drug use: No   Sexual activity: Not  Currently    Birth control/protection: Abstinence, Post-menopausal  Other Topics Concern   Not on file  Social History Narrative   Lives with husband -Krystal of 15 years       Yorkie- [place]      Enjoys: reading-all genres      Diet: eats all food groups   Caffeine: 1 cup daily at times, diet dr pepper daily   Water : gatorade  zero and water        Wears seat belt    Does not use phone while driving   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   Licensed conveyancer  -safe area         Social Drivers of Health   Financial Resource Strain: Medium Risk (03/16/2023)   Overall Financial Resource Strain (CARDIA)    Difficulty of Paying Living Expenses: Somewhat hard  Food Insecurity: No Food Insecurity (03/16/2023)   Hunger Vital Sign    Worried About Running Out of Food in the Last Year: Never true    Ran Out of Food in the Last Year: Never true  Transportation Needs: No Transportation Needs (03/16/2023)   PRAPARE - Administrator, Civil Service (Medical): No    Lack of Transportation (Non-Medical): No  Physical Activity: Insufficiently Active (03/16/2023)   Exercise Vital Sign    Days of Exercise per Week: 2 days    Minutes of Exercise per Session: 20 min  Stress: Stress Concern Present (03/16/2023)   [employer] of Occupational Health - Occupational Stress Questionnaire    Feeling of Stress : Rather much  Social Connections: Unknown (03/16/2023)   Social Connection and Isolation Panel    Frequency of Communication with Friends and Family: More than three times a week    Frequency of Social Gatherings with Friends and Family: Once a week    Attends Religious Services: 1 to 4 times per year    Active Member of [employer] or Organizations: Patient declined    Attends Banker Meetings: Patient declined    Marital Status: Married     Review of Systems   Gen: Denies fever, chills, anorexia. Denies fatigue, weakness, weight loss.  CV: Denies chest pain,  palpitations, syncope, peripheral edema, and claudication. Resp: Denies dyspnea at rest, cough, wheezing, coughing up blood, and pleurisy. GI: See HPI Derm: Denies rash, itching, dry skin Psych: Denies depression, anxiety, memory loss, confusion. No homicidal or suicidal ideation.  Heme: Denies bruising, bleeding, and enlarged lymph nodes.  Physical Exam   BP 105/73 (BP Location: Right Arm, Patient Position: Sitting, Cuff Size: Large)   Pulse 75   Temp 98.5 F (36.9 C) (Oral)   Ht 5' 4 (1.626 m)   Wt 180 lb (81.6 kg)   LMP 10/07/2011   SpO2 98%  BMI 30.90 kg/m   General:   Alert and oriented. No distress noted. Pleasant and cooperative.  Head:  Normocephalic and atraumatic. Eyes:  Conjuctiva clear without scleral icterus. Mouth:  Oral mucosa pink and moist. Good dentition. No lesions. Abdomen:  +BS, soft, non-distended. Mild ttp across mid abdomen bilaterally and epigastrium. No rebound or guarding. No HSM or masses noted. Rectal: deferred Msk:  Symmetrical without gross deformities. Normal posture. Extremities:  Without edema. Neurologic:  Alert and  oriented x4 Psych:  Alert and cooperative. Normal mood and affect. Anxious appearing.   Assessment & Plan   Bulimia nervosa with recurrent vomiting and chest pain Recurrent vomiting due to bulimia is causing severe chest pain, likely from esophageal irritation and muscle strain. Recent episodes are more frequent and severe, with vomiting of undigested food and bile. No significant inflammation was noted in the last endoscopy, but increased vomiting may worsen esophageal inflammation. Discussed potential electrolyte imbalances, such as hypokalemia or hyperkalemia, which can cause cardiac issues, though not directly linked to myocardial infarction risk. SVT is not believed to be caused by bulimia. - Refill Carafate  and Levsin . - Use Carafate  more frequently if needed for esophageal discomfort. - Educate on dissolving Levsin   sublingually for faster relief. - Seek emergency care for severe chest pain.  Chronic diarrhea, IBS-D: Chronic diarrhea with recent severe episodes affecting daily activities at times. Current management with cholestyramine /colestipol  is effective at two grams every other day, with adjustments for breakthrough symptoms. Levsin  is alleviating abdominal discomfort.  Symptoms appear to be worsened by anxiety/stress. - Continue cholestyramine  two grams every other day, adjust as needed for breakthrough symptoms. - Refill Levsin  0.125 SL TID as needed. - Consider Imodium  for breakthrough diarrhea..  Gastroesophageal reflux disease with epigastric pain and nausea GERD symptoms were well-controlled until a recent increase in stress and vomiting episodes. Epigastric pain and nausea are likely exacerbated by bulimia-related vomiting and stress. Current management includes Nexium , but she has been using over-the-counter alternatives, which may be less effective. - Refill Nexium  with a 90-day supply. - Educate on using prescription Nexium  for consistent dosing. - Monitor GERD symptoms and adjust treatment as needed. - GERD diet  Follow up   Follow up 6 months, sooner if needed.     Charmaine Melia, MSN, FNP-BC, AGACNP-BC [facility]

## 2023-08-18 DIAGNOSIS — F4310 Post-traumatic stress disorder, unspecified: Secondary | ICD-10-CM | POA: Diagnosis not present

## 2023-08-18 DIAGNOSIS — F429 Obsessive-compulsive disorder, unspecified: Secondary | ICD-10-CM | POA: Diagnosis not present

## 2023-08-18 DIAGNOSIS — F319 Bipolar disorder, unspecified: Secondary | ICD-10-CM | POA: Diagnosis not present

## 2023-08-19 ENCOUNTER — Encounter: Payer: Self-pay | Admitting: Gastroenterology

## 2023-08-19 ENCOUNTER — Ambulatory Visit: Admitting: Gastroenterology

## 2023-08-19 VITALS — BP 105/73 | HR 75 | Temp 98.5°F | Ht 64.0 in | Wt 180.0 lb

## 2023-08-19 DIAGNOSIS — K580 Irritable bowel syndrome with diarrhea: Secondary | ICD-10-CM | POA: Diagnosis not present

## 2023-08-19 DIAGNOSIS — F502 Bulimia nervosa, unspecified: Secondary | ICD-10-CM | POA: Diagnosis not present

## 2023-08-19 DIAGNOSIS — R112 Nausea with vomiting, unspecified: Secondary | ICD-10-CM

## 2023-08-19 DIAGNOSIS — K219 Gastro-esophageal reflux disease without esophagitis: Secondary | ICD-10-CM | POA: Diagnosis not present

## 2023-08-19 DIAGNOSIS — G8929 Other chronic pain: Secondary | ICD-10-CM

## 2023-08-19 DIAGNOSIS — K2100 Gastro-esophageal reflux disease with esophagitis, without bleeding: Secondary | ICD-10-CM

## 2023-08-19 DIAGNOSIS — K529 Noninfective gastroenteritis and colitis, unspecified: Secondary | ICD-10-CM

## 2023-08-19 DIAGNOSIS — R079 Chest pain, unspecified: Secondary | ICD-10-CM

## 2023-08-19 DIAGNOSIS — F5022 Bulimia nervosa, moderate: Secondary | ICD-10-CM

## 2023-08-19 DIAGNOSIS — R1013 Epigastric pain: Secondary | ICD-10-CM

## 2023-08-19 IMAGING — DX DG CHEST 2V
2 series · 2 of 2 positions shown · non-contrast
Comparison: Chest x-ray 01/26/2019.

CLINICAL DATA: 55-year-old female with history of cough and chest
pressure.

EXAM:
CHEST - 2 VIEW

[chest pa]
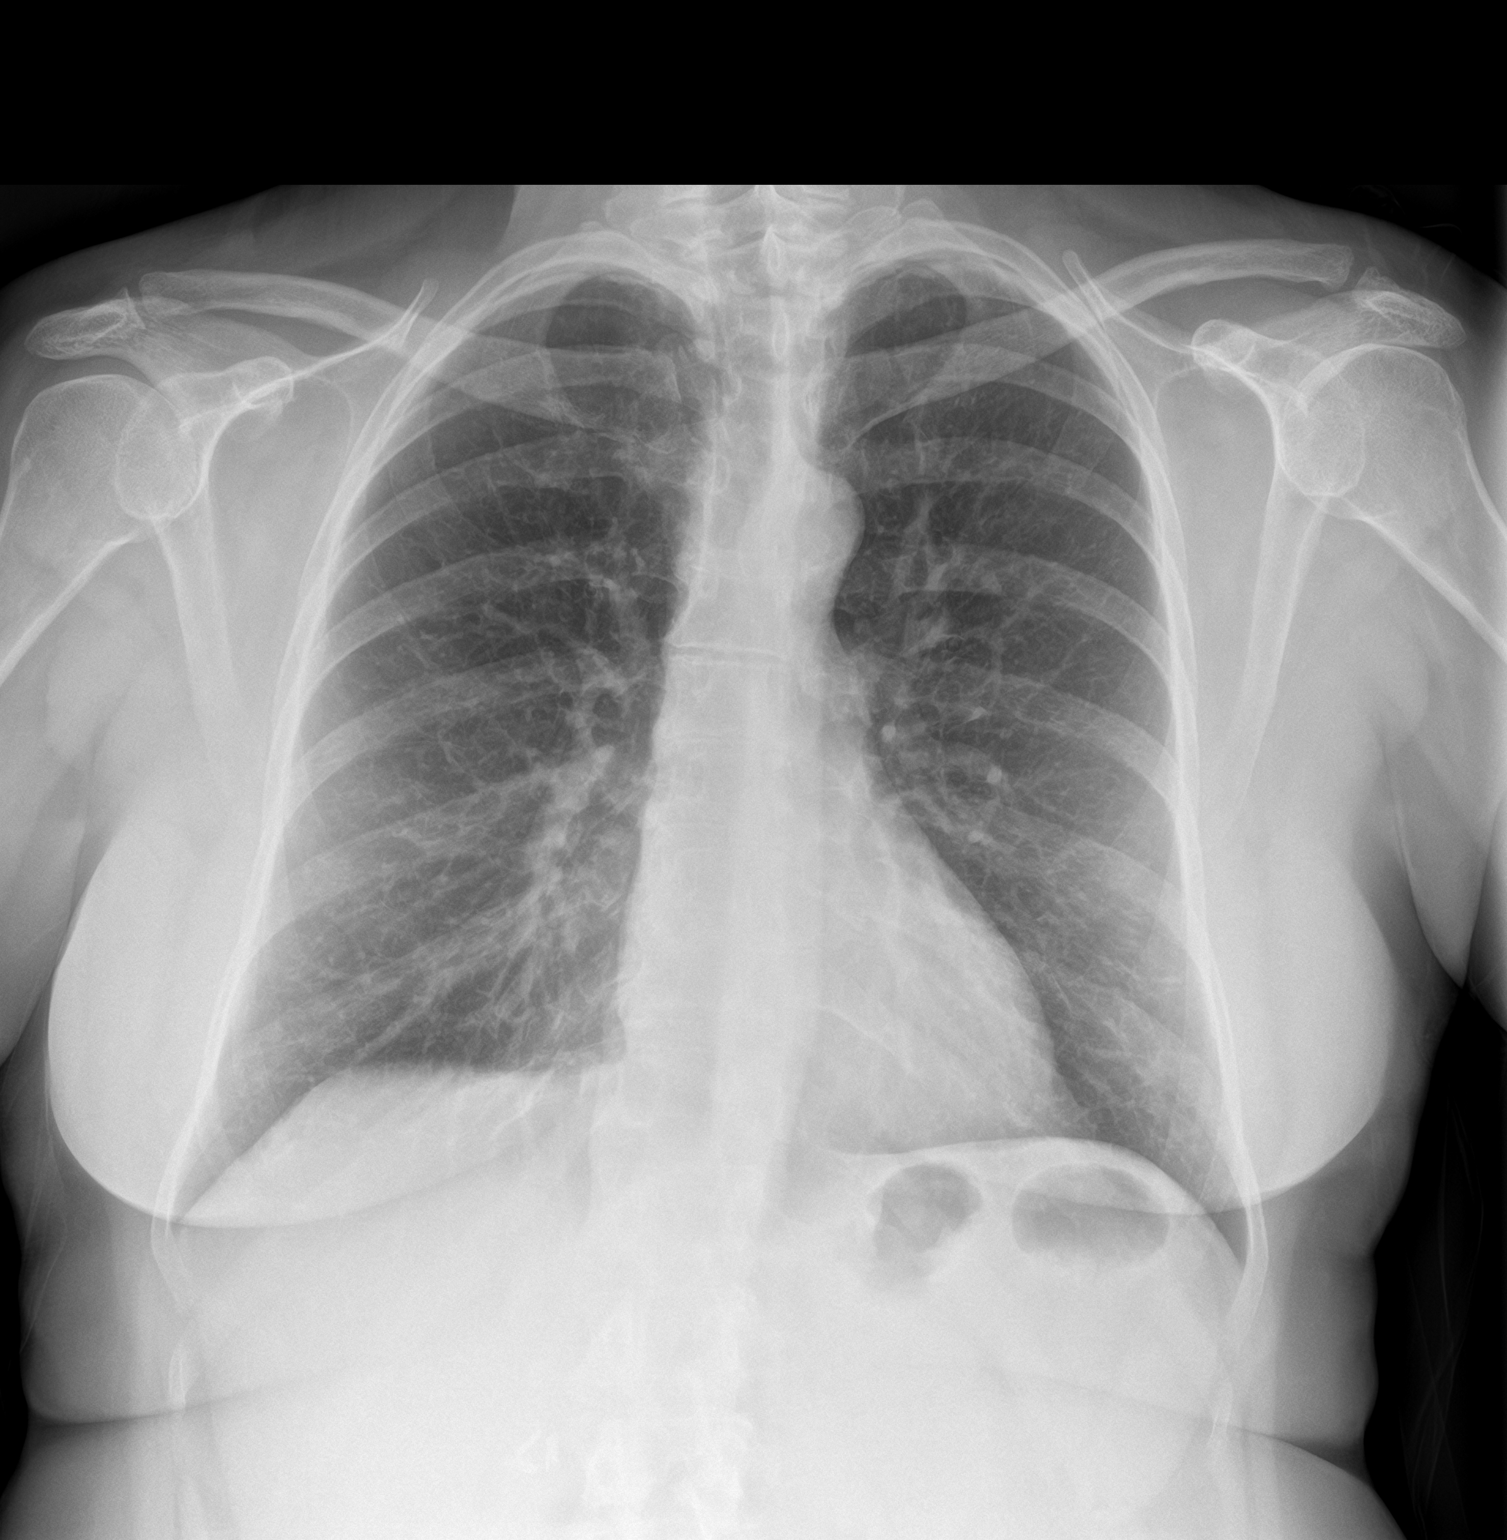

[chest lat]
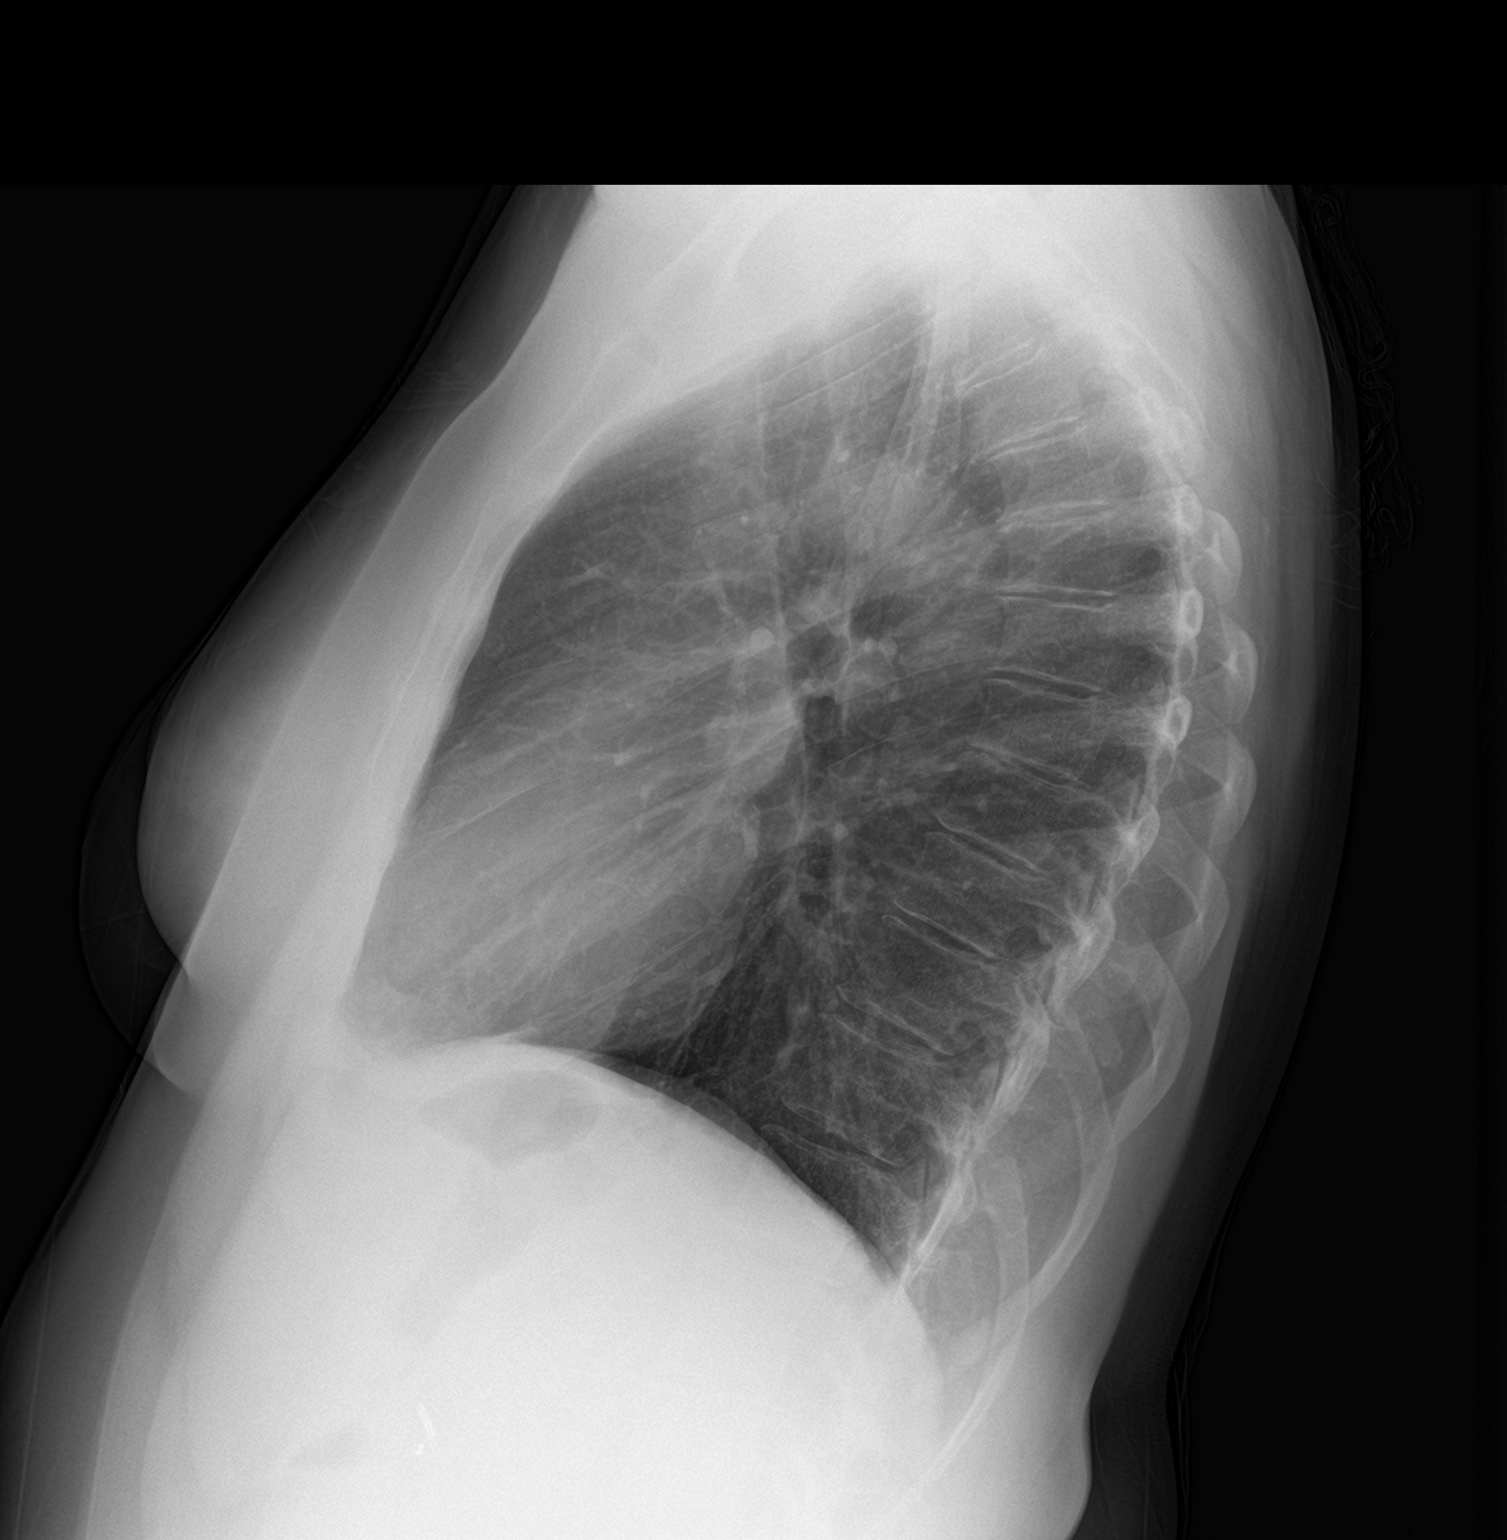

[2 of 2 positions shown; findings below may reference images not displayed]

FINDINGS: Lung volumes are normal. No consolidative airspace disease. No
pleural effusions. No pneumothorax. No pulmonary nodule or mass
noted. Pulmonary vasculature and the cardiomediastinal silhouette
are within normal limits.
IMPRESSION: No radiographic evidence of acute cardiopulmonary disease.

## 2023-08-19 MED ORDER — HYOSCYAMINE SULFATE 0.125 MG SL SUBL: 0.1250 mg | SUBLINGUAL_TABLET | SUBLINGUAL | 1 refills | Status: AC | PRN

## 2023-08-19 MED ORDER — SUCRALFATE 1 G PO TABS: ORAL_TABLET | ORAL | 3 refills | Status: DC

## 2023-08-19 MED ORDER — ESOMEPRAZOLE MAGNESIUM 40 MG PO CPDR: 40.0000 mg | DELAYED_RELEASE_CAPSULE | Freq: Every day | ORAL | 3 refills | Status: AC

## 2023-08-19 NOTE — Progress Notes (Deleted)
 Discussed the use of AI scribe software for clinical note transcription with the patient, who gave verbal consent to proceed.  History of Present Illness Jennifer [name] is a 58 year old female with bulimia and supraventricular tachycardia who presents with severe chest pain and gastrointestinal symptoms.  She experiences severe chest pain, described as the worst she has ever had, occurring all day on the past Sunday and recurring last night. The pain is associated with episodes of bulimia, particularly after vomiting, and has become more frequent and severe. She considered going to the hospital but did not due to the late hour and her husband's work schedule.  She has a history of supraventricular tachycardia (SVT) and recently experienced a racing heartbeat without chest pain. She managed the episode by lying down and hydrating. She was previously dismissed from cardiology for her SVT but was advised to return if problems persisted.  Her bulimia is problematic, with episodes of vomiting leading to chest pain. Vomiting is sometimes uncontrollable, with spasms in her stomach causing her to throw up bile and undigested food. These episodes have recently become more frequent and severe.  She experiences diarrhea, which she attributes to stress. A recent episode was so severe that she could not attend a therapy appointment. She manages her diarrhea with cholestyramine  tablets, taking two grams every other day, which she finds effective in stabilizing her symptoms.  She uses Levsin  and sucralfate  for gastrointestinal discomfort, with Levsin  helping her insomnia and sucralfate  providing relief for stomach pain. She also takes Nexium  for reflux, noting that over-the-counter alternatives have been less effective.  She mentions stress related to her husband's upcoming prostate biopsy and family dynamics, which she believes exacerbate her symptoms. She also reports a history of inflammation in the genital area,  for which she uses a prescribed clear ointment that has provided some relief.  No blood in vomit but notes occasional red streaks on toilet paper after bowel movements. No black stools.  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 Recent episodes are more frequent and severe, with vomiting of undigested food and bile. No significant inflammation was noted in the last endoscopy, but increased vomiting may worsen esophageal inflammation. Discussed potential electrolyte imbalances, such as hypokalemia or hyperkalemia, which can cause cardiac issues, though not directly linked to myocardial infarction risk. SVT is not believed to be caused by bulimia. - Refill Carafate  and Levsin . - Use Carafate  more frequently if needed for esophageal discomfort. - Educate on dissolving Levsin  sublingually for faster relief. - Seek emergency care for severe chest pain.  Chronic diarrhea Chronic diarrhea with recent severe episodes affecting daily activities. Current management with cholestyramine  is effective at two grams every other day, with adjustments for breakthrough symptoms. Levsin  is alleviating abdominal discomfort. - Continue cholestyramine  two grams every other day, adjust as needed for breakthrough symptoms. - Refill Levsin  and Carafate . - Consider Imodium  for breakthrough diarrhea. - Adjust cholestyramine  dosage based on symptoms.  Gastroesophageal reflux disease with epigastric pain and nausea GERD symptoms were well-controlled until a recent increase in stress and vomiting episodes. Epigastric pain and nausea are likely exacerbated by bulimia-related vomiting and stress. Current management includes Nexium , but she has been using over-the-counter alternatives, which may be less effective. - Refill Nexium  with a 90-day supply. - Educate  on using prescription Nexium  for consistent dosing. - Monitor GERD symptoms and  adjust treatment as needed.

## 2023-08-19 NOTE — Patient Instructions (Signed)
 YOUR PLAN: -BULIMIA NERVOSA WITH RECURRENT VOMITING AND CHEST PAIN: Bulimia nervosa is an eating disorder characterized by episodes of binge eating followed by vomiting. Your recurrent vomiting is causing severe chest pain, likely due to irritation of the esophagus and muscle strain. We have refilled your Carafate  and Levsin  prescriptions. You should use Carafate  more frequently if needed for esophageal discomfort and dissolve Levsin  under your tongue for faster relief. Seek emergency care if you experience severe chest pain.  -CHRONIC DIARRHEA: Chronic diarrhea is frequent, loose, or watery bowel movements. Your current management with cholestyramine  is effective, but you should adjust the dosage as needed for breakthrough symptoms. We have refilled your Levsin  and Carafate  prescriptions. Consider using Imodium  for breakthrough diarrhea. Continue colestipol  2g every other day.   -GASTROESOPHAGEAL REFLUX DISEASE (GERD) WITH EPIGASTRIC PAIN AND NAUSEA: GERD is a condition where stomach acid frequently flows back into the tube connecting your mouth and stomach, causing pain and discomfort. Your symptoms have worsened due to increased stress and vomiting. We have refilled your Nexium  prescription with a 90-day supply. Use the prescription Nexium  for consistent dosing and monitor your symptoms, adjusting treatment as needed.  INSTRUCTIONS: Please follow up with emergency care if you experience severe chest pain. Continue taking your medications as prescribed and adjust dosages as needed for breakthrough symptoms. Monitor your GERD symptoms and use prescription Nexium  for consistent relief. If your symptoms persist or worsen, schedule a follow-up appointment. Contains text generated by [employer].   Follow up in 6 months.   It was a pleasure to see you today. I want to create trusting relationships with patients. If you receive a survey regarding your visit,  I greatly appreciate you taking time to fill this  out on paper or through your MyChart. I value your feedback.  Charmaine Melia, MSN, FNP-BC, AGACNP-BC [facility]

## 2023-08-23 ENCOUNTER — Ambulatory Visit (INDEPENDENT_AMBULATORY_CARE_PROVIDER_SITE_OTHER): Payer: Medicare PPO | Admitting: Internal Medicine

## 2023-08-23 ENCOUNTER — Encounter: Payer: Self-pay | Admitting: Internal Medicine

## 2023-08-23 VITALS — BP 114/77 | HR 69 | Ht 64.0 in | Wt 178.8 lb

## 2023-08-23 DIAGNOSIS — R7303 Prediabetes: Secondary | ICD-10-CM

## 2023-08-23 DIAGNOSIS — F502 Bulimia nervosa, unspecified: Secondary | ICD-10-CM

## 2023-08-23 DIAGNOSIS — Z0001 Encounter for general adult medical examination with abnormal findings: Secondary | ICD-10-CM

## 2023-08-23 DIAGNOSIS — J432 Centrilobular emphysema: Secondary | ICD-10-CM

## 2023-08-23 DIAGNOSIS — E782 Mixed hyperlipidemia: Secondary | ICD-10-CM | POA: Diagnosis not present

## 2023-08-23 DIAGNOSIS — F3160 Bipolar disorder, current episode mixed, unspecified: Secondary | ICD-10-CM

## 2023-08-23 DIAGNOSIS — E559 Vitamin D deficiency, unspecified: Secondary | ICD-10-CM | POA: Diagnosis not present

## 2023-08-23 DIAGNOSIS — K219 Gastro-esophageal reflux disease without esophagitis: Secondary | ICD-10-CM | POA: Diagnosis not present

## 2023-08-23 NOTE — Assessment & Plan Note (Signed)
Mild wheezing, especially in the mornings Lung sounds benign today Has albuterol inhaler as needed for dyspnea or wheezing Needs to abstain from smoking, she agrees - quit in 2023

## 2023-08-23 NOTE — Assessment & Plan Note (Signed)
 Well-controlled with Nexium  40 mg QD Sucralfate  PRN for epigastric pain Followed by GI

## 2023-08-23 NOTE — Assessment & Plan Note (Signed)
Followed by psychiatry On [name], Luvox, gabapentin and Remeron/Trazodone On Atarax as needed

## 2023-08-23 NOTE — Assessment & Plan Note (Signed)
 Needs to avoid purging to avoid dehydration Followed by psychiatry On [name], Luvox, gabapentin and Remeron for bipolar disorder On Klonopin and Atarax as needed for anxiety

## 2023-08-23 NOTE — Assessment & Plan Note (Addendum)
 Physical exam as documented. Fasting blood tests ordered. Advised to get Tdap vaccine at local pharmacy.

## 2023-08-23 NOTE — Patient Instructions (Signed)
Please continue to take medications as prescribed.  Please continue to follow low carb diet and perform moderate exercise/walking at least 150 mins/week.  Please get fasting blood tests done within a week.  
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
## 2023-08-23 NOTE — Assessment & Plan Note (Signed)
 Lab Results  Component Value Date   HGBA1C 5.6 08/04/2022   Advised to follow low carb diet for now

## 2023-08-24 ENCOUNTER — Encounter: Payer: Self-pay | Admitting: Family Medicine

## 2023-08-25 DIAGNOSIS — F319 Bipolar disorder, unspecified: Secondary | ICD-10-CM | POA: Diagnosis not present

## 2023-08-25 DIAGNOSIS — F4310 Post-traumatic stress disorder, unspecified: Secondary | ICD-10-CM | POA: Diagnosis not present

## 2023-08-25 DIAGNOSIS — F429 Obsessive-compulsive disorder, unspecified: Secondary | ICD-10-CM | POA: Diagnosis not present

## 2023-08-30 DIAGNOSIS — F4312 Post-traumatic stress disorder, chronic: Secondary | ICD-10-CM | POA: Diagnosis not present

## 2023-09-02 ENCOUNTER — Other Ambulatory Visit: Payer: Self-pay | Admitting: Gastroenterology

## 2023-09-06 DIAGNOSIS — E559 Vitamin D deficiency, unspecified: Secondary | ICD-10-CM | POA: Diagnosis not present

## 2023-09-06 DIAGNOSIS — F3160 Bipolar disorder, current episode mixed, unspecified: Secondary | ICD-10-CM | POA: Diagnosis not present

## 2023-09-06 DIAGNOSIS — E782 Mixed hyperlipidemia: Secondary | ICD-10-CM | POA: Diagnosis not present

## 2023-09-06 DIAGNOSIS — R7303 Prediabetes: Secondary | ICD-10-CM | POA: Diagnosis not present

## 2023-09-07 ENCOUNTER — Ambulatory Visit: Payer: Self-pay | Admitting: Internal Medicine

## 2023-09-07 DIAGNOSIS — H10013 Acute follicular conjunctivitis, bilateral: Secondary | ICD-10-CM | POA: Diagnosis not present

## 2023-09-07 LAB — CBC WITH DIFFERENTIAL/PLATELET
Basophils Absolute: 0.1 x10E3/uL (ref 0.0–0.2)
Basos: 1 %
EOS (ABSOLUTE): 0.2 x10E3/uL (ref 0.0–0.4)
Eos: 2 %
Hematocrit: 45.1 % (ref 34.0–46.6)
Hemoglobin: 14.8 g/dL (ref 11.1–15.9)
Immature Grans (Abs): 0 x10E3/uL (ref 0.0–0.1)
Immature Granulocytes: 0 %
Lymphocytes Absolute: 2 x10E3/uL (ref 0.7–3.1)
Lymphs: 32 %
MCH: 30.1 pg (ref 26.6–33.0)
MCHC: 32.8 g/dL (ref 31.5–35.7)
MCV: 92 fL (ref 79–97)
Monocytes Absolute: 0.5 x10E3/uL (ref 0.1–0.9)
Monocytes: 8 %
Neutrophils Absolute: 3.6 x10E3/uL (ref 1.4–7.0)
Neutrophils: 57 %
Platelets: 216 x10E3/uL (ref 150–450)
RBC: 4.91 x10E6/uL (ref 3.77–5.28)
RDW: 12.9 % (ref 11.7–15.4)
WBC: 6.3 x10E3/uL (ref 3.4–10.8)

## 2023-09-07 LAB — CMP14+EGFR
ALT: 17 IU/L (ref 0–32)
AST: 22 IU/L (ref 0–40)
Albumin: 4.7 g/dL (ref 3.8–4.9)
Alkaline Phosphatase: 100 IU/L (ref 44–121)
BUN/Creatinine Ratio: 17 (ref 9–23)
BUN: 15 mg/dL (ref 6–24)
Bilirubin Total: 0.6 mg/dL (ref 0.0–1.2)
CO2: 22 mmol/L (ref 20–29)
Calcium: 9.6 mg/dL (ref 8.7–10.2)
Chloride: 104 mmol/L (ref 96–106)
Creatinine, Ser: 0.9 mg/dL (ref 0.57–1.00)
Globulin, Total: 2.3 g/dL (ref 1.5–4.5)
Glucose: 102 mg/dL — ABNORMAL HIGH (ref 70–99)
Potassium: 4.2 mmol/L (ref 3.5–5.2)
Sodium: 141 mmol/L (ref 134–144)
Total Protein: 7 g/dL (ref 6.0–8.5)
eGFR: 74 mL/min/1.73 (ref >59)

## 2023-09-07 LAB — LIPID PANEL
Chol/HDL Ratio: 2.9 ratio (ref 0.0–4.4)
Cholesterol, Total: 172 mg/dL (ref 100–199)
HDL: 59 mg/dL (ref >39)
LDL Chol Calc (NIH): 95 mg/dL (ref 0–99)
Triglycerides: 97 mg/dL (ref 0–149)
VLDL Cholesterol Cal: 18 mg/dL (ref 5–40)

## 2023-09-07 LAB — VITAMIN D 25 HYDROXY (VIT D DEFICIENCY, FRACTURES): Vit D, 25-Hydroxy: 29.6 ng/mL — ABNORMAL LOW (ref 30.0–100.0)

## 2023-09-07 LAB — HEMOGLOBIN A1C
Est. average glucose Bld gHb Est-mCnc: 117 mg/dL
Hgb A1c MFr Bld: 5.7 % — ABNORMAL HIGH (ref 4.8–5.6)

## 2023-09-07 LAB — TSH: TSH: 1.8 u[IU]/mL (ref 0.450–4.500)

## 2023-09-13 ENCOUNTER — Other Ambulatory Visit: Payer: Self-pay | Admitting: Internal Medicine

## 2023-09-13 ENCOUNTER — Encounter: Payer: Self-pay | Admitting: Internal Medicine

## 2023-09-13 DIAGNOSIS — J432 Centrilobular emphysema: Secondary | ICD-10-CM

## 2023-09-14 ENCOUNTER — Other Ambulatory Visit: Payer: Self-pay

## 2023-09-14 ENCOUNTER — Ambulatory Visit: Payer: Medicare PPO | Admitting: Gastroenterology

## 2023-09-14 DIAGNOSIS — J432 Centrilobular emphysema: Secondary | ICD-10-CM

## 2023-09-14 MED ORDER — ALBUTEROL SULFATE HFA 108 (90 BASE) MCG/ACT IN AERS: 2.0000 | INHALATION_SPRAY | Freq: Four times a day (QID) | RESPIRATORY_TRACT | 5 refills | Status: AC | PRN

## 2023-09-15 DIAGNOSIS — F4310 Post-traumatic stress disorder, unspecified: Secondary | ICD-10-CM | POA: Diagnosis not present

## 2023-09-15 DIAGNOSIS — F429 Obsessive-compulsive disorder, unspecified: Secondary | ICD-10-CM | POA: Diagnosis not present

## 2023-09-15 DIAGNOSIS — F319 Bipolar disorder, unspecified: Secondary | ICD-10-CM | POA: Diagnosis not present

## 2023-09-22 DIAGNOSIS — F429 Obsessive-compulsive disorder, unspecified: Secondary | ICD-10-CM | POA: Diagnosis not present

## 2023-09-22 DIAGNOSIS — F4310 Post-traumatic stress disorder, unspecified: Secondary | ICD-10-CM | POA: Diagnosis not present

## 2023-09-22 DIAGNOSIS — F319 Bipolar disorder, unspecified: Secondary | ICD-10-CM | POA: Diagnosis not present

## 2023-09-27 IMAGING — DX DG PELVIS 1-2V
1 series · 1 of 1 positions shown · non-contrast
Comparison: CT 03/20/2019.

CLINICAL DATA: Left hip pain.

EXAM:
PELVIS - 1-2 VIEW

[pelvis ap]
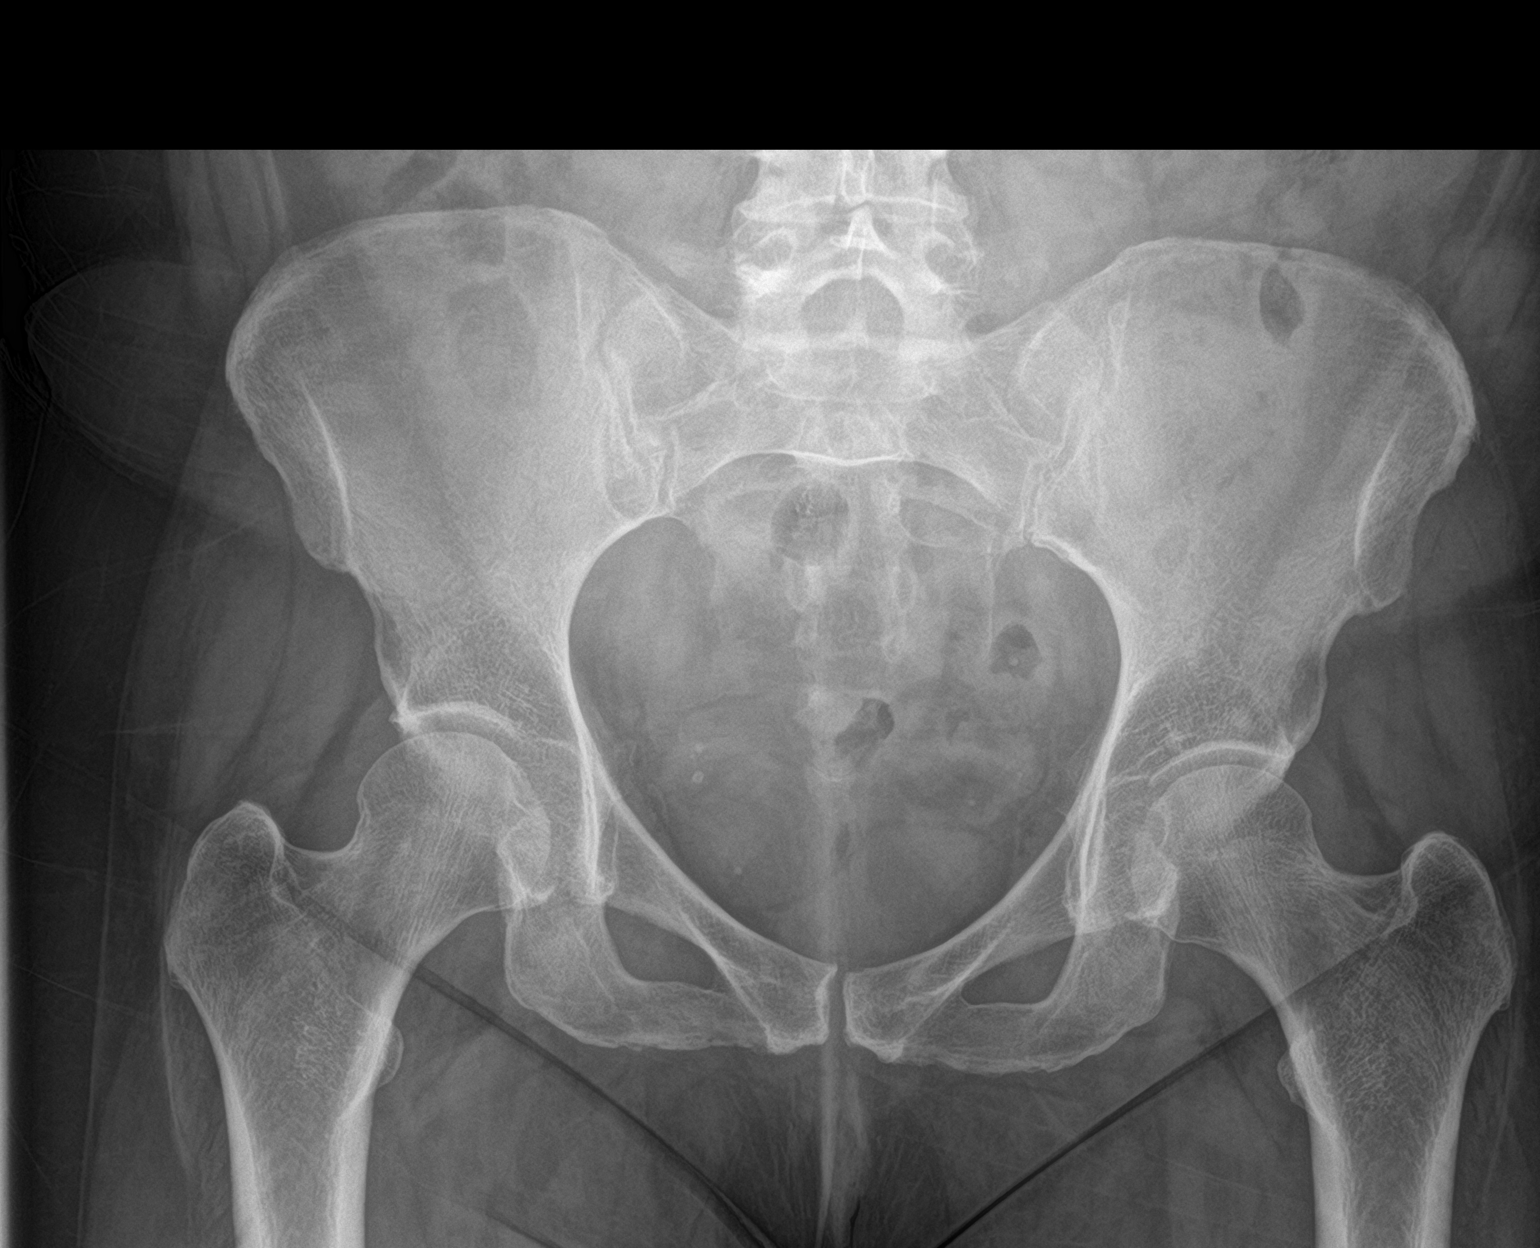

[1 of 1 positions shown; findings below may reference images not displayed]

FINDINGS: Degenerative changes lumbar spine, both SI joints, both hips. Tiny
well-circumscribed sclerotic foci noted over the left acetabulum,
most likely benign bone islands. No acute bony or joint abnormality
identified. No evidence of fracture or dislocation. Pelvic
calcifications consistent with phleboliths.
IMPRESSION: Degenerative changes lumbar spine and both hips. No acute
abnormality.

## 2023-09-28 ENCOUNTER — Telehealth (INDEPENDENT_AMBULATORY_CARE_PROVIDER_SITE_OTHER): Admitting: Physician Assistant

## 2023-09-28 ENCOUNTER — Encounter: Payer: Self-pay | Admitting: Physician Assistant

## 2023-09-28 DIAGNOSIS — F429 Obsessive-compulsive disorder, unspecified: Secondary | ICD-10-CM

## 2023-09-28 DIAGNOSIS — F3130 Bipolar disorder, current episode depressed, mild or moderate severity, unspecified: Secondary | ICD-10-CM

## 2023-09-28 DIAGNOSIS — F411 Generalized anxiety disorder: Secondary | ICD-10-CM | POA: Diagnosis not present

## 2023-09-28 DIAGNOSIS — R413 Other amnesia: Secondary | ICD-10-CM | POA: Diagnosis not present

## 2023-09-28 NOTE — Progress Notes (Signed)
 [facility]  Patient ID: Jennifer [name],  MRN: [id number]  PCP: Tobie Suzzane POUR, MD  Date of Evaluation: 09/28/2023 Time spent:20 minutes  Chief Complaint:   Chief Complaint   Anxiety; Depression; Insomnia; Follow-up   Virtual Visit via Telehealth  I connected with patient by a video enabled telemedicine application with their informed consent, and verified patient privacy and that I am speaking with the correct person using two identifiers.  I am private, in my office and the patient is at home.  I discussed the limitations, risks, security and privacy concerns of performing an evaluation and management service by video and the availability of in person appointments. I also discussed with the patient that there may be a patient responsible charge related to this service. The patient expressed understanding and agreed to proceed.   I discussed the assessment and treatment plan with the patient. The patient was provided an opportunity to ask questions and all were answered. The patient agreed with the plan and demonstrated an understanding of the instructions.   The patient was advised to call back or seek an in-person evaluation if the symptoms worsen or if the condition fails to improve as anticipated.  I provided approximately 20 minutes of non-face-to-face time during this encounter.  HISTORY/CURRENT STATUS: HPI for routine med check  Has been more forgetful and confused. She forgot to pay the bills last month which is not like her.  Can't remember words, or sequence of a project or something.  Says she's been depressed, doesn't want to get out of bed some days, but she does.  Pushes herself. 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 Not sleeping well most of the time.  Gets up and down to go to [place]. Not taking Mirtazpine every night.  It helps when she takes it.  Doesn't take Klonopin  or Hydroxyzine  very often. So the 'clouded thinking' happens even when  she doesn't take either of those meds.  No extreme sadness, tearfulness, or feelings of hopelessness. ADLs and personal hygiene are normal.   Appetite has not changed.  Weight is stable.  Continues binging and purging sometimes. Sees GI.    No mania, delirium, AH/VH.  No SI/HI.  Individual Medical History/ Review of Systems: Changes? :No        Past medications for mental health diagnoses include: Risperdal, Seroquel, Prozac, Zoloft,  Wellbutrin, Lamictal , Depakote  caused hair loss, Xanax , Ambien , trazodone , Trileptal, Luvox , Topamax, Elavil, Pamelor, BuSpar, doxazosin, prazosin, lithium, Vraylar , Abilify  caused increased appetite, caused weight gain which is contraindicated w/ her eating d/o, and didn't work for sx, [name] , Latuda  >60 mg caused abd pain and nausea, propranolol  caused diarrhea.  Allergies: Codeine, Penicillins, Depakote  [divalproex  sodium], Mobic [meloxicam], Sonata [zaleplon], Topamax [topiramate], Aciphex  [rabeprazole ], Aimovig  [erenumab -aooe], and Emgality [galcanezumab-gnlm]  Current Medications:  Current Outpatient Medications:    albuterol  (VENTOLIN  HFA) 108 (90 Base) MCG/ACT inhaler, Inhale 2 puffs into the lungs every 6 (six) hours as needed for wheezing or shortness of breath., Disp: 18 g, Rfl: 5   Brexpiprazole  ([name] ) 4 MG TABS, Take 1 tablet (4 mg total) by mouth in the morning., Disp: 30 tablet, Rfl: 11   clonazePAM  (KLONOPIN ) 0.5 MG tablet, Take 1 tablet (0.5 mg total) by mouth 3 (three) times daily as needed for anxiety., Disp: 30 tablet, Rfl: 1   colestipol  (COLESTID ) 1 g tablet, TAKE 4 TABLETS (4 GRAMS TOTAL) BY MOUTH TWO TIMES DAILY, Disp: 720 tablet, Rfl: 2   esomeprazole  (NEXIUM ) 40  MG capsule, Take 1 capsule (40 mg total) by mouth daily., Disp: 90 capsule, Rfl: 3   Famotidine -Ca Carb-Mag Hydrox (PEPCID  COMPLETE PO), Take 1 tablet by mouth in the morning., Disp: , Rfl:    fluticasone  (FLONASE ) 50 MCG/ACT nasal spray, Place 2 sprays into both nostrils daily.,  Disp: 16 g, Rfl: 6   fluvoxaMINE  (LUVOX ) 100 MG tablet, Take 2 tablets (200 mg total) by mouth 2 (two) times daily., Disp: 360 tablet, Rfl: 3   gabapentin  (NEURONTIN ) 600 MG tablet, 2 p.o. every morning, 1 p.o. q. afternoon, 2 p.o. nightly., Disp: 150 tablet, Rfl: 11   hydrOXYzine  (ATARAX ) 25 MG tablet, Take 1-2 tablets (25-50 mg total) by mouth every 8 (eight) hours as needed., Disp: 90 tablet, Rfl: 11   hyoscyamine  (LEVSIN /SL) 0.125 MG SL tablet, Place 1 tablet (0.125 mg total) under the tongue every 4 (four) hours as needed (for abdominal cramping/pain)., Disp: 60 tablet, Rfl: 1   loratadine (CLARITIN) 10 MG tablet, Take 10 mg by mouth in the morning and at bedtime. Morning & afternoon., Disp: , Rfl:    mirtazapine  (REMERON ) 15 MG tablet, Take 1 tablet (15 mg total) by mouth at bedtime., Disp: 90 tablet, Rfl: 1   naproxen  sodium (ALEVE ) 220 MG tablet, Take 660 mg by mouth daily as needed (migraine)., Disp: , Rfl:    ondansetron  (ZOFRAN -ODT) 4 MG disintegrating tablet, Take 1 tablet (4 mg total) by mouth every 8 (eight) hours as needed for nausea or vomiting., Disp: 30 tablet, Rfl: 0   Probiotic Product (DAILY PROBIOTIC PO), Take 2 capsules by mouth daily with lunch., Disp: , Rfl:    RESTASIS  0.05 % ophthalmic emulsion, Place 1 drop into both eyes 2 (two) times daily., Disp: , Rfl:    sucralfate  (CARAFATE ) 1 g tablet, Take 1 g slurry around meals and at bedtime as needed for nausea and epigastric pain., Disp: 120 tablet, Rfl: 3   traZODone  (DESYREL ) 100 MG tablet, Take 1-2 tablets (100-200 mg total) by mouth at bedtime as needed for sleep., Disp: 60 tablet, Rfl: 11   triamcinolone  ointment (KENALOG ) 0.1 %, Apply 1 Application topically 2 (two) times daily., Disp: , Rfl:    Ubrogepant  (UBRELVY ) 100 MG TABS, Take 100 mg by mouth with or without food. If needed, a second dose may be taken at least 2 hours after the initial dose. The maximum dose in a 24-hour period is 200 mg., Disp: 10 tablet, Rfl: 11    valACYclovir  (VALTREX ) 500 MG tablet, Take 500 mg by mouth daily at 12 noon., Disp: , Rfl:  Medication Side Effects: none  Family Medical/ Social History: Changes?  No  MENTAL HEALTH EXAM:  Last menstrual period 10/07/2011.There is no height or weight on file to calculate BMI.  General Appearance: Casual and Well Groomed  Eye Contact:  Good  Speech:  Clear and Coherent and Normal Rate  Volume:  Normal  Mood:  Euthymic  Affect:  Congruent  Thought Process:  Goal Directed and Descriptions of Associations: Circumstantial  Orientation:  Full (Time, Place, and Person)  Thought Content: Logical   Suicidal Thoughts:  No  Homicidal Thoughts:  No  Memory:  Immediate;   Fair Recent;   Fair Remote;   Good  Judgement:  Good  Insight:  Good  Psychomotor Activity:  Normal  Concentration:  Concentration: Good and Attention Span: Good  Recall:  Good  Fund of Knowledge: Good  Language: Good  Assets:  Desire for Improvement Financial Resources/Insurance Housing Leisure Time Resilience  Transportation  ADL's:  Intact  Cognition: WNL  Prognosis:  Good   DIAGNOSES:    ICD-10-CM   1. Bipolar I disorder, most recent episode depressed (HCC)  F31.30     2. Obsessive-compulsive disorder, unspecified type  F42.9     3. Generalized anxiety disorder  F41.1     4. Memory deficit  R41.3       Receiving Psychotherapy: Yes  Lauraine Jester, LCMSW at [facility]connections, Elijah Sciara for brain spotting  RECOMMENDATIONS:  PDMP was reviewed.  Gabapentin  filled 09/14/2023.  Klonopin  filled 06/28/2023. I provided approximately 20 minutes of non-face-to-face time during this encounter, including time spent before and after the visit in records review, medical decision making, counseling pertinent to today's visit, and charting.   We discussed the memory and cloudy thinking.  She does seem to be slightly depressed although not as severe as it has been in the past.  She is also not sleeping as well  as either of us  would like.  I recommend she take the mirtazapine  routinely so it should help sleep and depression.  If that alone is not effective in the past 4 to 6 weeks then I will recommend that she return to her neurologist for further evaluation.  Also avoid hydroxyzine  and Klonopin  as much as possible. They can cause confusion.   Continue [name]  4 mg, 1 p.o. every morning. Continue Klonopin  0.5 mg, 1 p.o. twice daily prn. Continue Luvox  100 mg, 2 p.o. twice daily.   Continue gabapentin  600 mg, 2 qam.,  1 p.o. q. afternoon, and 2 p.o. nightly.   Continue hydroxyzine  25-50 mg, 1 p.o. 3 times daily as needed. Continue mirtazapine  15 mg p.o. nightly. Hold trazodone  when she takes Mirtazapine .  Continue therapy. Return in 6 weeks.   Verneita Cooks, PA-C

## 2023-09-29 DIAGNOSIS — F4310 Post-traumatic stress disorder, unspecified: Secondary | ICD-10-CM | POA: Diagnosis not present

## 2023-09-29 DIAGNOSIS — F319 Bipolar disorder, unspecified: Secondary | ICD-10-CM | POA: Diagnosis not present

## 2023-09-29 DIAGNOSIS — F429 Obsessive-compulsive disorder, unspecified: Secondary | ICD-10-CM | POA: Diagnosis not present

## 2023-10-03 IMAGING — US US EXTREM LOW VENOUS*R*
1 series · 14 of 24 positions shown · non-contrast
Comparison: None.

CLINICAL DATA: leg swelling, r/o DVT

EXAM:
RIGHT LOWER EXTREMITY VENOUS DOPPLER ULTRASOUND
TECHNIQUE: Gray-scale sonography with compression, as well as color and duplex
ultrasound, were performed to evaluate the deep venous system(s)
from the level of the common femoral vein through the popliteal and
proximal calf veins.

[Series 1: us venous img lower uni right (dvt) · portal-venous · 14 of 45 slices shown]
[im 1/45]
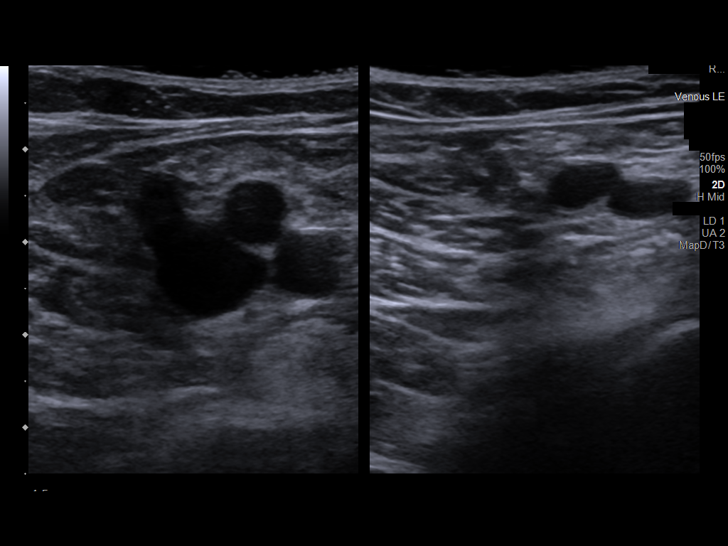
[im 4/45]
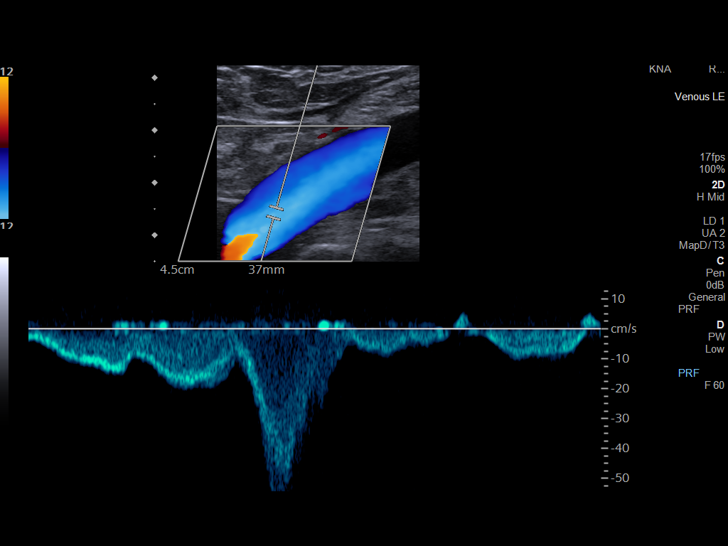
[im 8/45]
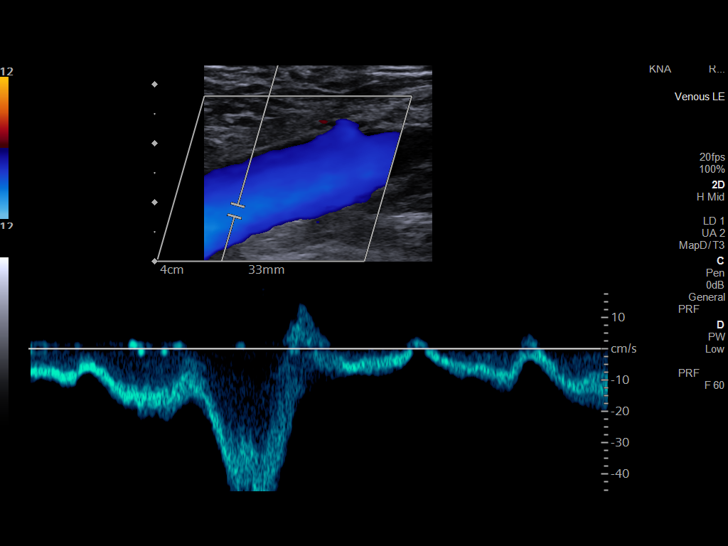
[im 12/45]
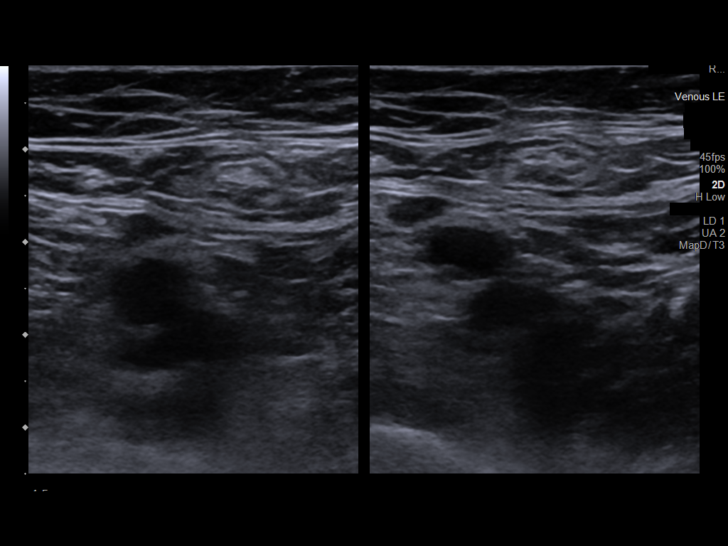
[im 14/45]
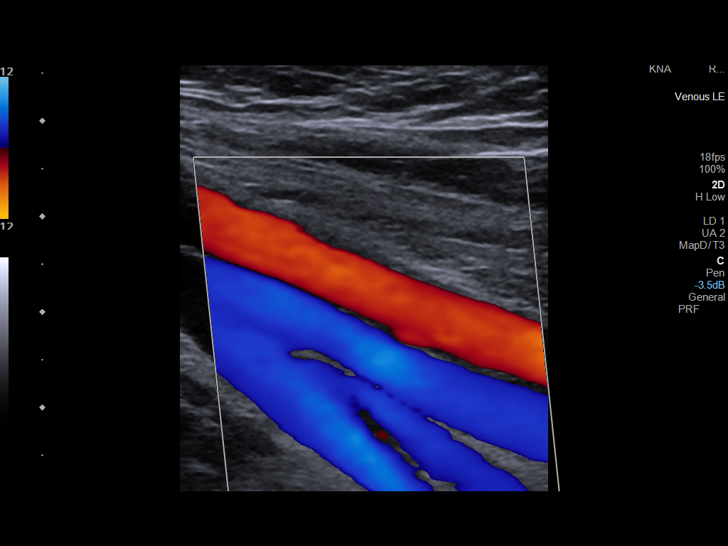
[im 18/45]
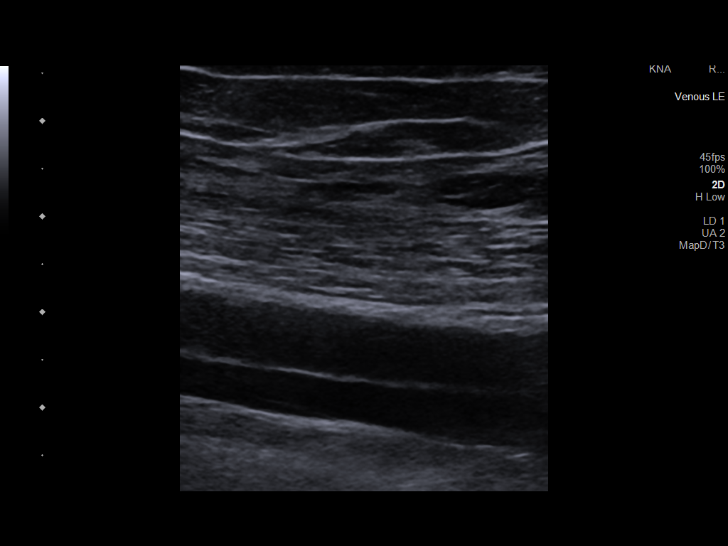
[im 22/45]
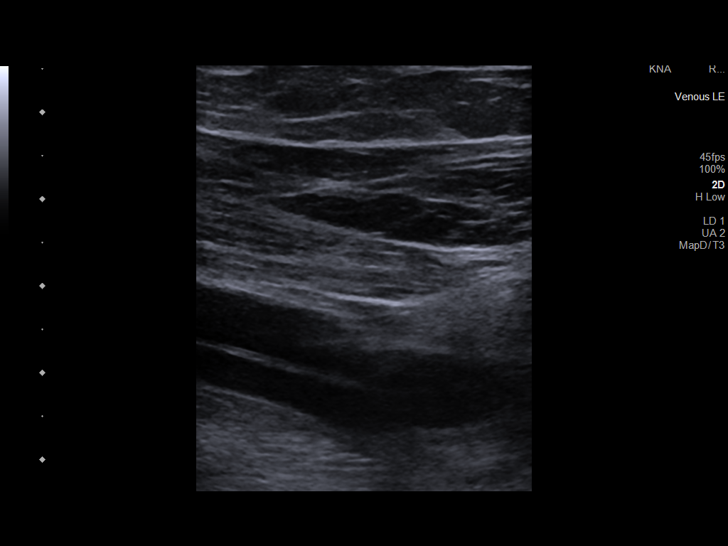
[im 23/45]
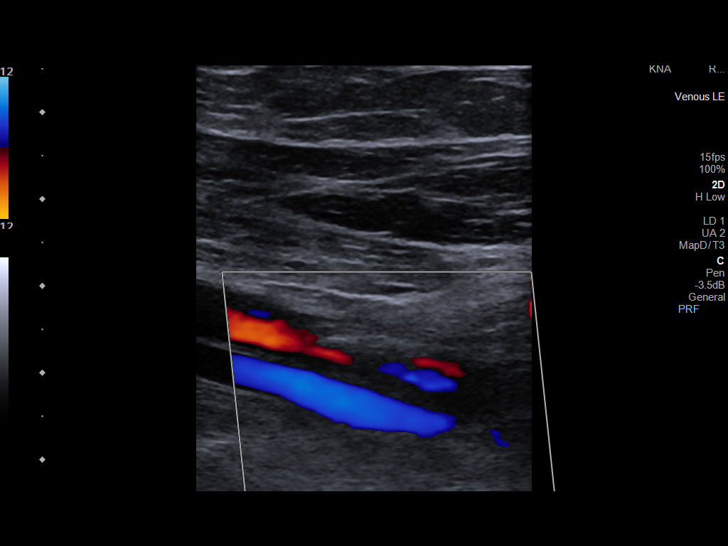
[im 27/45]
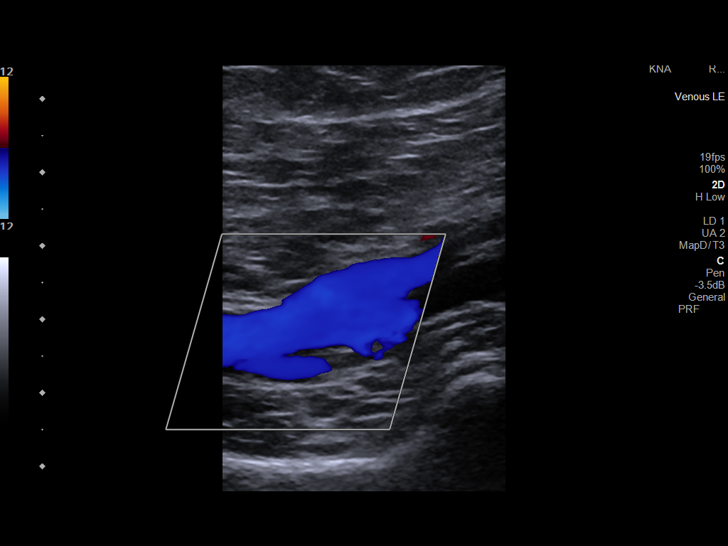
[im 31/45]
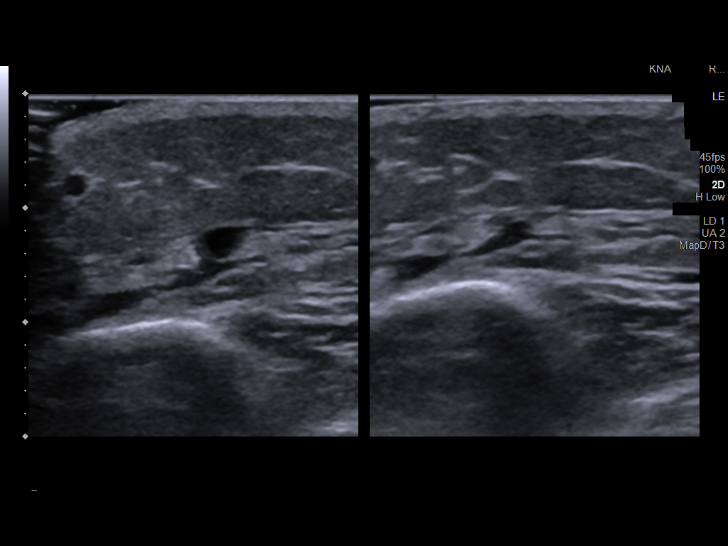
[im 35/45]
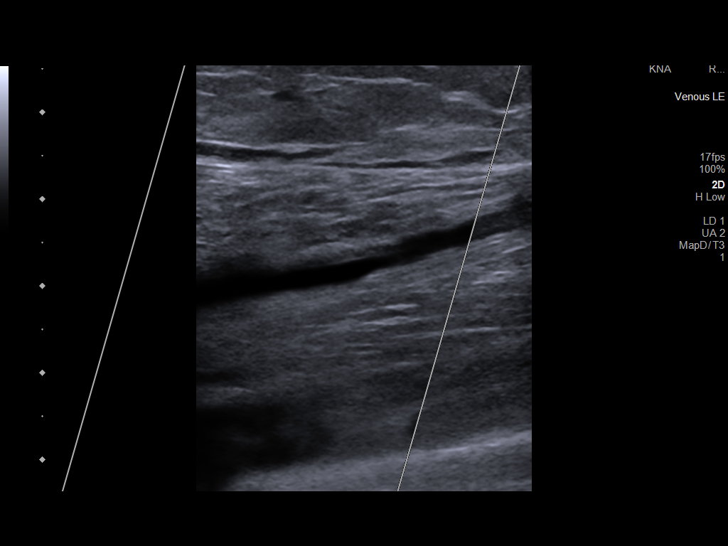
[im 37/45]
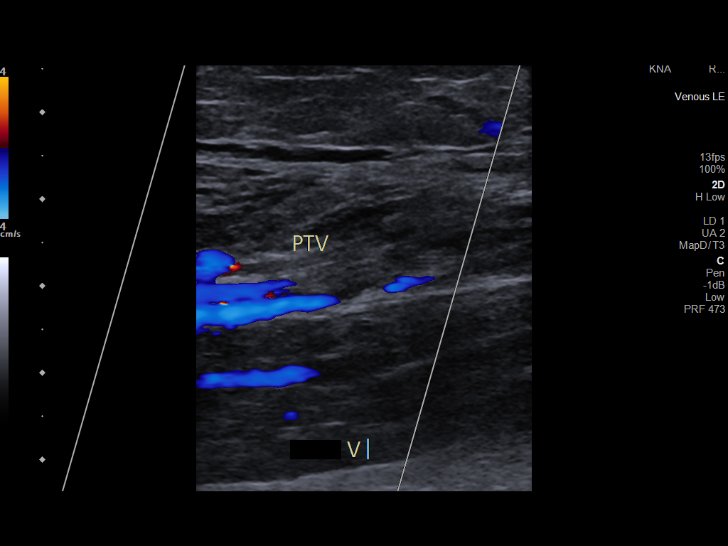
[im 41/45]
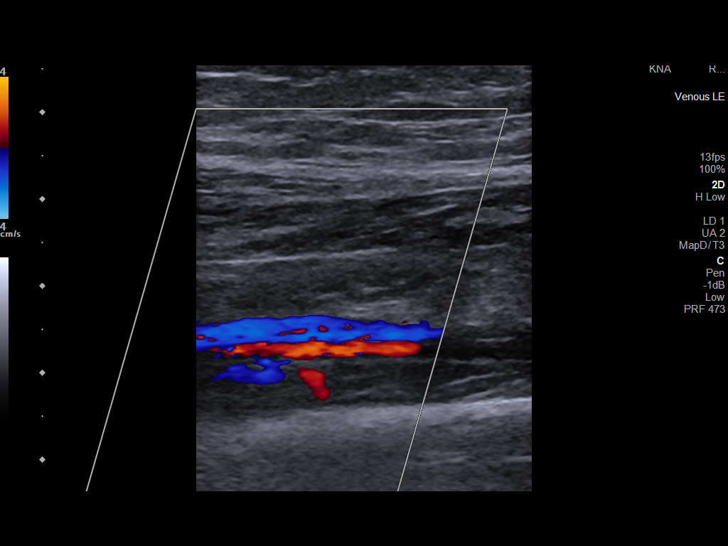
[im 45/45]
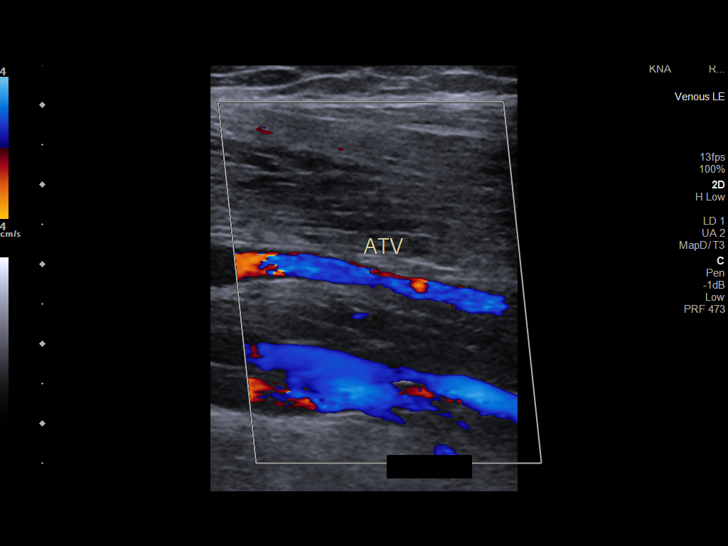

[14 of 24 positions shown; findings below may reference images not displayed]

FINDINGS: VENOUS

Normal compressibility of the common femoral, superficial femoral,
and popliteal veins, as well as the visualized calf veins.
Visualized portions of profunda femoral vein and great saphenous
vein unremarkable. No filling defects to suggest DVT on grayscale or
color Doppler imaging. Doppler waveforms show normal direction of
venous flow, normal respiratory plasticity and response to
augmentation.

Limited views of the contralateral common femoral vein are
unremarkable.
IMPRESSION: No evidence of DVT in the right lower extremity.

## 2023-10-06 DIAGNOSIS — F319 Bipolar disorder, unspecified: Secondary | ICD-10-CM | POA: Diagnosis not present

## 2023-10-06 DIAGNOSIS — F4310 Post-traumatic stress disorder, unspecified: Secondary | ICD-10-CM | POA: Diagnosis not present

## 2023-10-06 DIAGNOSIS — F429 Obsessive-compulsive disorder, unspecified: Secondary | ICD-10-CM | POA: Diagnosis not present

## 2023-10-08 ENCOUNTER — Telehealth: Admitting: Physician Assistant

## 2023-10-11 ENCOUNTER — Ambulatory Visit (INDEPENDENT_AMBULATORY_CARE_PROVIDER_SITE_OTHER): Payer: Self-pay

## 2023-10-11 DIAGNOSIS — Z23 Encounter for immunization: Secondary | ICD-10-CM

## 2023-10-11 DIAGNOSIS — F4312 Post-traumatic stress disorder, chronic: Secondary | ICD-10-CM | POA: Diagnosis not present

## 2023-10-19 ENCOUNTER — Ambulatory Visit: Admitting: Gastroenterology

## 2023-10-19 ENCOUNTER — Encounter: Payer: Self-pay | Admitting: Gastroenterology

## 2023-10-19 VITALS — BP 113/77 | HR 75 | Temp 98.0°F | Ht 64.0 in | Wt 180.2 lb

## 2023-10-19 DIAGNOSIS — K580 Irritable bowel syndrome with diarrhea: Secondary | ICD-10-CM | POA: Diagnosis not present

## 2023-10-19 DIAGNOSIS — F5023 Bulimia nervosa, severe: Secondary | ICD-10-CM

## 2023-10-19 DIAGNOSIS — E739 Lactose intolerance, unspecified: Secondary | ICD-10-CM

## 2023-10-19 DIAGNOSIS — K529 Noninfective gastroenteritis and colitis, unspecified: Secondary | ICD-10-CM

## 2023-10-19 DIAGNOSIS — R109 Unspecified abdominal pain: Secondary | ICD-10-CM

## 2023-10-19 DIAGNOSIS — L309 Dermatitis, unspecified: Secondary | ICD-10-CM | POA: Diagnosis not present

## 2023-10-19 DIAGNOSIS — K2100 Gastro-esophageal reflux disease with esophagitis, without bleeding: Secondary | ICD-10-CM | POA: Diagnosis not present

## 2023-10-19 NOTE — Progress Notes (Signed)
 GI Office Note    Referring Provider: Tobie Suzzane POUR, MD Primary Care Physician:  Tobie Suzzane POUR, MD Primary Gastroenterologist: Lamar HERO.Rourk, MD  Date:  10/19/2023  ID:  Jennifer [name], DOB [birth year], MRN [id number]   Chief Complaint   Chief Complaint  Patient presents with   Follow-up    Follow up. Having diarrhea for about a week.    History of Present Illness  Jennifer [name] is a 58 y.o. female with a history of bulimia, GERD, anxiety, bipolar 1 depression, arthritis, fatty liver, and IBS-D/bile salt induced diarrhea presenting today with complaint of recent worsening of diarrhea.   Colonoscopy September 2021: -2 sigmoid polyps -Advised repeat in 10 years   Clinic visit in April 2023 with complaint of diarrhea.  Having 3-4 loose stools per day, small to moderate amounts.  Also with incomplete defecation and occasional urgency.  Denies nocturnal stools.  Abdominal pain relieved with defecation.  Pain primarily periumbilical region and lower abdomen.  Diarrhea made worse with eating.  Had good relief with over-the-counter Imodium .  Negative C. difficile, Giardia.  GI pathogen panel was positive for norovirus.   PCP visit in July again noting chronic diarrhea.  Had some improvement with Bentyl  fiber, and probiotic.  Also reported chronic nausea due to bulimia.  Started on cholestyramine  4 g twice daily.   Visit on 10/01/2021, virtual follow-up.  Imodium  remained helpful with diarrhea however reported more abdominal pain.  Cholestyramine  also somewhat helpful however difficult with twice daily dosing as she reported all contents do not seem to resolve.  Still having urgency with varying bowel habits.  Sometimes 8-9 stools per day and varying amounts.  Denied nausea, melena, BRBPR, fever, or chills.  Reports scattered bulimic episodes.  Advised Colestid  4 g 2-3 times daily.  Advised to continue Levsin  for abdominal pain.  Use Imodium  prior to meals if persistent symptoms.  Advised to  keep a food and symptom log.  Potential to try Xifaxan in the future if symptoms continue.   She underwent cardiac ablation in October given symptomatic SVT.  Ablation was successful and her metoprolol  was discontinued.  She was advised to follow-up in 1 year.    Office visit 12/22/21. Having a bad migraine that day.  Zofran  somewhat helping.  Continue to have issues with purging and continue to see psychiatry and therapy.  Mostly having dry heaves occurring in the mornings most of the week lasting 10 to 15 minutes at a time.  Improves if she lays back down.  Continues to have occasional abdominal pain with diarrhea however felt as though Colestid  was working well for her.  Taking it mostly once daily, at times she takes it twice a day it may not be as easy for her to go.  Continues to admit to improvement of bloating if she does not purge.  Advised to continue dicyclomine  20 mg as needed.  Zofran  refilled.  Advised to continue Colestid  4 g once to twice daily for diarrhea, also refilled.  Continue to advise famotidine  daily and increase to twice daily if needed, continue follow-up with therapy for bulimia/purging, and to avoid lactose products.   OV 03/10/22.  Probiotic which helped improve her diarrhea therefore she stopped her colestipol  and then had return of diarrhea.  Diarrhea was causing redness inflammation on her perineum.  Nausea improved, only having dry heaves occasionally.  Has limiting Zofran  is severe and she needs it.  Did report some recent purging associated with abdominal spasms.  Continuing to undergo weekly therapy and brain spotting for bulimia and bipolar disorder.  At improvement abdominal pain since starting probiotic.  Not using dicyclomine  much at all.  Advised Desitin as needed for perineal irritation.  Advised to resume colestipol  4 g once to twice daily.  Continue dicyclomine  20 mg as needed.  Continue famotidine  once daily and Zofran  as needed.  Avoid lactose.   Patient sent in a  message at the end of February reporting stomach upset/pain.  Continues to have purging.  Tried dicyclomine  1 night and Levsin  management.  Consider short course of Carafate  for 2 weeks.  Advised to increase famotidine  to twice daily.   Received notification the patient recently went to the ED at the beginning of March for abdominal pain and vomiting.  Reinforced the need to stop all NSAIDs.  Advised that she may need possible repeat EGD.  Nexium  was sent in given intolerance to pantoprazole  in the past due to severe diarrhea.   CT A/P 03/25/2022: No acute abnormality in the abdomen or pelvis, moderate hiatal hernia.   OV 04/21/2022.  Patient having epigastric pain more with sitting up in the morning down.  Has some relief with Mylanta.  Tried to have a hamburger however had upset stomach and pain shortly after followed by increased nausea.  Had not been taking pantoprazole , only famotidine  twice daily.  After her ED visit she has started on Nexium .  Denied any dysphagia.  Diarrhea controlled with colestipol  and dicyclomine  as needed.  Reportedly to recent purging episodes. Scheduled for EGD.  Advised to avoid NSAIDs, follow GERD diet.  Continue Nexium  20 mg once daily and famotidine  20 mg twice daily.  Continue colestipol  4 g once daily and dicyclomine  up to 3 times daily as needed.   EGD 05/20/2022: -Normal esophagus -Medium size hiatal hernia -Normal duodenum -Advise continue current medications -Advised Carafate  suspension 1 g before meals and at bedtime as needed for pain and nausea   OV 09/08/22.  Reportedly still purging but continue to follow with dietitian and behavioral health.  Using an app to help track her meals as well as her feelings and thoughts.  Continues to have some epigastric pain with purging.  Having some loose stools as well as some constipation.  Taking colestipol  as needed, has needed up to 8 tablets/day in the past.  Having some intermittent urgency as well.  Taking Nexium  20 mg  once daily over-the-counter and Carafate  as needed.  Taking dicyclomine  as well as needed however limits her use given insomnia.  Overall feeling better.  Advised to continue Nexium , Carafate .  Continue colestipol  and dicyclomine  as needed.  Continue to follow GERD diet and following with behavioral health in regards to bulimia.   OV 03/09/23 with me.  Still using dicyclomine  as needed given reports of insomnia.  Had only used it about 5 times in the last several months prior.  Taking Carafate  about 3 times per week.  Continuing to have some intermittent epigastric pain and fullness which prompts her to take Carafate .  A lot of times happens after meals but reports she has been eating less.  Continue to follow with behavioral health for her brain spotting and for treatment of her bulimia.  Having purging episodes about 2-3 times per week.  Had stopped colestipol  given she was not really having any diarrhea.  Still struggling with some vaginal discharge and inflammation issues which she was seeing GYN for.  Most days having 1-2 bowel movements a day, occasionally skipping a  day.  Advised to continue Nexium  and increase to 40 mg once daily.  Continue Carafate  as needed and use colestipol  4 g as needed along with dicyclomine  and Zofran .   Last office visit 03/17/23 with Sonny Kerns.  Continuing to work at limiting her purging.  Had gotten down to only 2 episodes per week and usually occurring at nighttime.  Have been trying to focus on distractions when she feels the urge to purge.  Shortly after her last office visit she began having significant upper abdominal pain and had 3 episodes of this in the past and can last for hours at a time.  Tried Tums/Mylanta and laying down and eventually settles down.  No bowel movement with the pain.  Reported epigastric pain was resolving and was just sore.  Having occasional toilet tissue hematochezia.  She noted that her epigastric pain seem to be triggered by anxiety/stress.   Continuing to not take dicyclomine  very often due to his insomnia.  She was given Levsin  to use in place of dicyclomine .  Labs ordered.  Advised continue Nexium  and to work toward goals of no purging at all.   Patient notified us  that on 7/14 she started back on colestipol  everyday due to severe diarrhea.   Last office visit 08/19/23.  Experiencing some severe chest pain over the weekend and reported multiple episodes of bulimia and vomiting.  Had noted the history of SVT as well.  To experiencing diarrhea which she attributed to stress and noted her most recent episode was very severe that she was not able to attend her therapy appointment.  Had been using cholestyramine  tablets to help with her diarrhea, taking about 2 g every other day which was effective in managing her symptoms.  Noted some stress between her husband's health recently as well as some family dynamics.  Refilled Levsin  sublingually to use 3 times daily as needed and Imodium  for breakthrough diarrhea as well as continue colestipol  2 g every other day.  Was given a refill of her Nexium  for her reflux and her nausea.  Also prescribed a refill of Carafate  to use for any esophageal discomfort secondary to multiple episodes of vomiting.  Today:  Discussed the use of AI scribe software for clinical note transcription with the patient, who gave verbal consent to proceed.  She has been experiencing worsening diarrhea since September, significantly impacting her daily life. Certain dietary choices, particularly 'zero' sugar drinks containing aspartame, exacerbate her symptoms, leading her to discontinue these drinks. She identifies several medications that may contribute to her diarrhea, including [name] , Luvox , gabapentin , and dicyclomine . She previously experienced hallucinations with dicyclomine , which she attributes to taking multiple doses per day, and has since switched to hyoscyamine  (Levsin ). Bentyl  also causes her insomnia.   Her  diarrhea is potentially triggered by dietary factors such as lactose and certain foods like onions and cheese. BBQ and fattier meals also tend to worsen symptoms. She has been trying to avoid these triggers and is using peppermint and ginger to manage symptoms. She has also been taking colestipol , ensuring it is spaced out from other medications to avoid absorption issues. Total colestipol  she has been taking is 4g daily. She was having upwards of 5 BM daily which was causing perianal dermatitis. Today thus far she has only had 1 BM.   She has a history of acid reflux, which has improved with Nexium . She is also vitamin D  deficient and is taking supplements for this.  She experiences episodes of severe perineal discomfort,  described as 'on fire,' with associated redness. She has been using Kenalog  cream, Vaseline, and diaper rash cream for relief, noting that the symptoms often resolve overnight after using these treatments. Not currently inflamed today.   She has a history of bulimia but reports no episodes in the last eight days. She is cautious with her diet to avoid triggering her symptoms and is mindful of her medication regimen to manage her irritable bowel syndrome.     Denies recent sick exposure. No recent antibiotics.   Wt Readings from Last 5 Encounters:  10/19/23 180 lb 3.2 oz (81.7 kg)  08/23/23 178 lb 12.8 oz (81.1 kg)  08/19/23 180 lb (81.6 kg)  07/05/23 181 lb 6.4 oz (82.3 kg)  06/16/23 178 lb 1.3 oz (80.8 kg)    Current Outpatient Medications  Medication Sig Dispense Refill   albuterol  (VENTOLIN  HFA) 108 (90 Base) MCG/ACT inhaler Inhale 2 puffs into the lungs every 6 (six) hours as needed for wheezing or shortness of breath. 18 g 5   Brexpiprazole  ([name] ) 4 MG TABS Take 1 tablet (4 mg total) by mouth in the morning. 30 tablet 11   clonazePAM  (KLONOPIN ) 0.5 MG tablet Take 1 tablet (0.5 mg total) by mouth 3 (three) times daily as needed for anxiety. 30 tablet 1   colestipol   (COLESTID ) 1 g tablet TAKE 4 TABLETS (4 GRAMS TOTAL) BY MOUTH TWO TIMES DAILY 720 tablet 2   esomeprazole  (NEXIUM ) 40 MG capsule Take 1 capsule (40 mg total) by mouth daily. 90 capsule 3   Famotidine -Ca Carb-Mag Hydrox (PEPCID  COMPLETE PO) Take 1 tablet by mouth in the morning.     fluticasone  (FLONASE ) 50 MCG/ACT nasal spray Place 2 sprays into both nostrils daily. 16 g 6   fluvoxaMINE  (LUVOX ) 100 MG tablet Take 2 tablets (200 mg total) by mouth 2 (two) times daily. 360 tablet 3   gabapentin  (NEURONTIN ) 600 MG tablet 2 p.o. every morning, 1 p.o. q. afternoon, 2 p.o. nightly. 150 tablet 11   hydrOXYzine  (ATARAX ) 25 MG tablet Take 1-2 tablets (25-50 mg total) by mouth every 8 (eight) hours as needed. 90 tablet 11   hyoscyamine  (LEVSIN /SL) 0.125 MG SL tablet Place 1 tablet (0.125 mg total) under the tongue every 4 (four) hours as needed (for abdominal cramping/pain). 60 tablet 1   loratadine (CLARITIN) 10 MG tablet Take 10 mg by mouth in the morning and at bedtime. Morning & afternoon.     mirtazapine  (REMERON ) 15 MG tablet Take 1 tablet (15 mg total) by mouth at bedtime. 90 tablet 1   naproxen  sodium (ALEVE ) 220 MG tablet Take 660 mg by mouth daily as needed (migraine).     ondansetron  (ZOFRAN -ODT) 4 MG disintegrating tablet Take 1 tablet (4 mg total) by mouth every 8 (eight) hours as needed for nausea or vomiting. 30 tablet 0   Probiotic Product (DAILY PROBIOTIC PO) Take 2 capsules by mouth daily with lunch.     RESTASIS  0.05 % ophthalmic emulsion Place 1 drop into both eyes 2 (two) times daily.     sucralfate  (CARAFATE ) 1 g tablet Take 1 g slurry around meals and at bedtime as needed for nausea and epigastric pain. 120 tablet 3   traZODone  (DESYREL ) 100 MG tablet Take 1-2 tablets (100-200 mg total) by mouth at bedtime as needed for sleep. 60 tablet 11   triamcinolone  ointment (KENALOG ) 0.1 % Apply 1 Application topically 2 (two) times daily.     Ubrogepant  (UBRELVY ) 100 MG TABS Take 100 mg by  mouth  with or without food. If needed, a second dose may be taken at least 2 hours after the initial dose. The maximum dose in a 24-hour period is 200 mg. 10 tablet 11   valACYclovir  (VALTREX ) 500 MG tablet Take 500 mg by mouth daily at 12 noon.     No current facility-administered medications for this visit.    Past Medical History:  Diagnosis Date   Abdominal pain, epigastric 10/07/2018   Acute non-recurrent maxillary sinusitis 11/07/2019   Allergy  10/20/2018   Runny nose   Anxiety    Arthritis    Binge-eating and purging type anorexia nervosa    Bipolar affective disorder (HCC) 12/04/2015   Bipolar affective disorder, current episode manic without psychotic symptoms (HCC) 12/04/2015   Bipolar disorder (HCC)    Cellulitis and abscess of buttock 02/15/2013   Chronic kidney disease 2020   Growths detected on kidneys when had ultrasound 3 or 4 years ago.   Cystitis, interstitial    Depression    Depression    Phreesia 01/28/2020   Dysrhythmia    Eczema    Emphysema lung (HCC)    Emphysema of lung (HCC) 2022   Esophageal polyp    about 20 years ago   GERD (gastroesophageal reflux disease)    Hypertension    Left wrist pain 03/22/2019   Neuromuscular disorder (HCC) 2019   OCD (obsessive compulsive disorder)    Palpitations 05/25/2014   Rectal pain 12/06/2018   Recurrent upper respiratory infection (URI)    Rib pain on right side 01/31/2019   Rosacea 03/27/2019   Sprain of temporomandibular joint or ligament 01/10/2019   Tobacco abuse 12/17/2021   Ulcer Doctor Rourk   Stomach    Past Surgical History:  Procedure Laterality Date   BLADDER SURGERY     CHOLECYSTECTOMY     COLONOSCOPY WITH PROPOFOL  N/A 10/05/2019   Procedure: COLONOSCOPY WITH PROPOFOL ;  Surgeon: Shaaron Lamar HERO, MD;  Location: [facility];  Service: Endoscopy;  Laterality: N/A;  12:45pm   COSMETIC SURGERY N/A    Phreesia 01/28/2020   ESOPHAGOGASTRODUODENOSCOPY  09/28/2016   [facility]; Dr. Saintclair; erosions in  the esophagus, 5 cm hiatal hernia, nonbleeding erosive gastropathy s/p biopsied, normal duodenum.  Path with chronic inactive gastritis, no H. pylori or intestinal metaplasia.    ESOPHAGOGASTRODUODENOSCOPY (EGD) WITH PROPOFOL  N/A 10/17/2018   Dr. Shaaron: mild reflux esophagitis, small hiatal hernia   ESOPHAGOGASTRODUODENOSCOPY (EGD) WITH PROPOFOL  N/A 05/20/2022   Procedure: ESOPHAGOGASTRODUODENOSCOPY (EGD) WITH PROPOFOL ;  Surgeon: Shaaron Lamar HERO, MD;  Location: [facility];  Service: Endoscopy;  Laterality: N/A;  7:30am;ASA 3   POLYPECTOMY  10/05/2019   Procedure: POLYPECTOMY;  Surgeon: Shaaron Lamar HERO, MD;  Location: [facility];  Service: Endoscopy;;   SVT ABLATION N/A 10/31/2021   Procedure: SVT ABLATION;  Surgeon: Nancey Eulas BRAVO, MD;  Location: [facility];  Service: Cardiovascular;  Laterality: N/A;   VOCAL CORD LATERALIZATION, ENDOSCOPIC APPROACH W/ MLB     vocal cord nodule removed      Family History  Problem Relation Age of Onset   Healthy Mother    Arthritis Mother    Varicose Veins Mother    Healthy Father    Alcohol abuse Father    Anxiety disorder Father    Depression Father    Early death Father    Migraines Paternal Grandmother    Arthritis Paternal Grandmother    Heart disease Paternal Grandmother    Stroke Paternal Grandfather  Colon cancer Neg Hx    Colon polyps Neg Hx     Allergies as of 10/19/2023 - Review Complete 10/19/2023  Allergen Reaction Noted   Codeine Shortness Of Breath, Nausea Only, and Rash 02/15/2013   Penicillins Hives, Rash, Shortness Of Breath, and Swelling 10/09/2011   Depakote  [divalproex  sodium] Hives, Itching, and Rash 01/28/2020   Mobic [meloxicam] Nausea And Vomiting and Other (See Comments) 05/08/2014   Sonata [zaleplon] Other (See Comments) 10/22/2017   Topamax [topiramate] Other (See Comments) 10/04/2018   Aciphex  [rabeprazole ] Swelling 07/16/2020   Aimovig  [erenumab -aooe] Itching 06/26/2019   Emgality  [galcanezumab-gnlm] Hives and Rash 10/04/2018    Social History   Socioeconomic History   Marital status: Married    Spouse name: Todd   Number of children: 0   Years of education: Not on file   Highest education level: Bachelor's degree (e.g., BA, AB, BS)  Occupational History   Not on file  Tobacco Use   Smoking status: Former    Current packs/day: 0.00    Average packs/day: 1 pack/day for 33.8 years (33.8 ttl pk-yrs)    Types: Cigarettes    Start date: 01/20/1988    Quit date: 11/12/2021    Years since quitting: 1.9   Smokeless tobacco: Never  Vaping Use   Vaping status: Never Used  Substance and Sexual Activity   Alcohol use: No   Drug use: No   Sexual activity: Not Currently    Birth control/protection: Abstinence, Post-menopausal  Other Topics Concern   Not on file  Social History Narrative   Lives with husband -Krystal of 15 years       Yorkie- [place]      Enjoys: reading-all genres      Diet: eats all food groups   Caffeine: 1 cup daily at times, diet dr pepper daily   Water : gatorade  zero and water        Wears seat belt    Does not use phone while driving   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   Licensed conveyancer  -safe area         Social Drivers of Health   Financial Resource Strain: Medium Risk (08/20/2023)   Overall Financial Resource Strain (CARDIA)    Difficulty of Paying Living Expenses: Somewhat hard  Food Insecurity: Food Insecurity Present (08/20/2023)   Hunger Vital Sign    Worried About Running Out of Food in the Last Year: Sometimes true    Ran Out of Food in the Last Year: Sometimes true  Transportation Needs: No Transportation Needs (08/20/2023)   PRAPARE - Administrator, Civil Service (Medical): No    Lack of Transportation (Non-Medical): No  Physical Activity: Inactive (08/20/2023)   Exercise Vital Sign    Days of Exercise per Week: 0 days    Minutes of Exercise per Session: Not on file  Stress: Stress Concern Present  (08/20/2023)   [employer] of Occupational Health - Occupational Stress Questionnaire    Feeling of Stress: Very much  Social Connections: Moderately Isolated (08/20/2023)   Social Connection and Isolation Panel    Frequency of Communication with Friends and Family: More than three times a week    Frequency of Social Gatherings with Friends and Family: Once a week    Attends Religious Services: Patient declined    Database administrator or Organizations: No    Attends Engineer, structural: Not on file    Marital Status: Married  Review of Systems   Gen: Denies fever, chills, anorexia. Denies fatigue, weakness, weight loss.  CV: Denies chest pain, palpitations, syncope, peripheral edema, and claudication. Resp: Denies dyspnea at rest, cough, wheezing, coughing up blood, and pleurisy. GI: See HPI Derm: Denies rash, itching, dry skin Psych: Denies depression, anxiety, memory loss, confusion. No homicidal or suicidal ideation.  Heme: Denies bruising, bleeding, and enlarged lymph nodes.  Physical Exam   BP 113/77 (BP Location: Right Arm, Patient Position: Sitting, Cuff Size: Normal)   Pulse 75   Temp 98 F (36.7 C) (Temporal)   Ht 5' 4 (1.626 m)   Wt 180 lb 3.2 oz (81.7 kg)   LMP 10/07/2011   BMI 30.93 kg/m   General:   Alert and oriented. No distress noted. Pleasant and cooperative.  Head:  Normocephalic and atraumatic. Eyes:  Conjuctiva clear without scleral icterus. Mouth:  Oral mucosa pink and moist. Good dentition. No lesions. Lungs:  Clear to auscultation bilaterally. No wheezes, rales, or rhonchi. No distress.  Heart:  S1, S2 present without murmurs appreciated.  Abdomen:  +BS, soft, non-tender and non-distended. No rebound or guarding. No HSM or masses noted. Rectal: offered but patient deferred.  Msk:  Symmetrical without gross deformities. Normal posture. Extremities:  Without edema. Neurologic:  Alert and  oriented x4 Psych:  Alert and cooperative.  Normal mood and affect.  Assessment & Plan   ANQUINETTE PIERRO is a 58 y.o. female presenting today with complaint of worsening diarrhea.      Irritable bowel syndrome with diarrhea and lactose intolerance, bile acid diarrhea Chronic diarrhea exacerbated by dietary factors, particularly lactose and fattier meals. Unsure is sugar substitutes contributing to diarrhea. Recent increase in diarrhea frequency, possibly related to dietary indiscretions and potential medication side effects. Lactose intolerance confirmed, with symptoms triggered by dairy products. Current management includes dietary modifications and medication adjustments. History of cholecystectomy as well therefore as bile-acid component as well.  - Continue dietary modifications to avoid lactose and trigger foods. - Continue taking four grams of colestipol  once daily for at least another week, then adjust based on bowel movement frequency. - Take Levsin  (hyoscyamine ) as needed for abdominal cramping and diarrhea.  - Take Imodium  (loperamide ) 2 mg 30 minutes before meals at the upcoming wedding to prevent diarrhea or when going out if needed.  - Use Lactaid tablets with dairy exposure to manage lactose intolerance. - Avoid sugar substitutes like aspartame and sucralose.  Perianal dermatitis Intermittent perianal dermatitis with episodes of severe redness and pain. Symptoms improve with Kenalog  cream, but alternative treatments are being considered due to difficulty accessing the prescribing provider. Exam offered today - patient declined.  - Use Aquaphor or diaper rash cream as alternatives to Kenalog  cream for perianal dermatitis. - Avoid using Preparation H for perianal dermatitis. - Contact gynecologist for Kenalog  cream refill if needed.  Gastroesophageal reflux disease, Bulimia nervosa GERD symptoms well-controlled with current Nexium  regimen. Recent exacerbations reported at prior visits but stable currently. congratulated her on  being free of purging for the last 8 days in regard to her known bulimia.  - Continue Nexium  40 mg once daily.  - GERD diet      Follow up   Follow up 6 months.    Jennifer Melia, MSN, FNP-BC, AGACNP-BC [facility]

## 2023-10-19 NOTE — Patient Instructions (Addendum)
 Continue taking your Nexium  as you have been.  For now continue colestipol  4 g daily for the next week unless you are becoming constipated.  After a week of therapy he may decrease by 1 g daily until you get to 2 g.  At any point please continue to decrease if feeling constipated.  VISIT SUMMARY:  During your visit, we discussed your worsening diarrhea, which has been significantly impacting your daily life. We reviewed your dietary habits, medications, and other factors that may be contributing to your symptoms. We also addressed your perianal discomfort and your well-controlled acid reflux.  YOUR PLAN:  -IRRITABLE BOWEL SYNDROME WITH DIARRHEA AND LACTOSE INTOLERANCE: Irritable bowel syndrome (IBS) is a condition that affects the digestive system, causing symptoms like diarrhea, which can be triggered by certain foods and medications. You should continue avoiding lactose and trigger foods (any dairy), take colestipol  daily, use Levsin  as needed, take Imodium  before meals at the upcoming wedding, use Lactaid with dairy consumption, and limit sugar substitutes like aspartame and sucralose if contributing to diarrhea.   For now continue colestipol  4 g daily for the next week unless you are becoming constipated.  After a week of therapy you may decrease by 1 g daily until you get to 2 g.  At any point please continue to decrease if feeling constipated. Use levsin  if having cramping sensation.   It is okay to use imodium  before an event or eating a certain meal to avoid the diarrhea and urgency.   Use lactaid with consumption of dairy products - keep in your purse with you.   -PERIANAL DERMATITIS: Perianal dermatitis is a condition that causes redness and pain around the anus. You should use Aquaphor or diaper rash cream instead of Kenalog  cream, avoid using Preparation H, and contact your gynecologist if you need a refill of Kenalog  cream.  May use Aquaphor to help with skin protection or even  diaper rash cream to help soothe as well.   -GASTROESOPHAGEAL REFLUX DISEASE: Gastroesophageal reflux disease (GERD) is a condition where stomach acid frequently flows back into the tube connecting your mouth and stomach, causing discomfort. Your symptoms are well-controlled with Nexium , so you should continue your current regimen. Let me know if you need refills  Congratulations on being purge free for the last 8 days!  Keep up the great work!  Follow up in 6 months.   It was a pleasure to see you today. I want to create trusting relationships with patients. If you receive a survey regarding your visit,  I greatly appreciate you taking time to fill this out on paper or through your MyChart. I value your feedback.  Charmaine Melia, MSN, FNP-BC, AGACNP-BC [facility]

## 2023-11-03 DIAGNOSIS — F319 Bipolar disorder, unspecified: Secondary | ICD-10-CM | POA: Diagnosis not present

## 2023-11-03 DIAGNOSIS — F429 Obsessive-compulsive disorder, unspecified: Secondary | ICD-10-CM | POA: Diagnosis not present

## 2023-11-03 DIAGNOSIS — F4310 Post-traumatic stress disorder, unspecified: Secondary | ICD-10-CM | POA: Diagnosis not present

## 2023-11-04 ENCOUNTER — Other Ambulatory Visit: Payer: Self-pay

## 2023-11-04 ENCOUNTER — Encounter: Payer: Self-pay | Admitting: Family Medicine

## 2023-11-04 DIAGNOSIS — G629 Polyneuropathy, unspecified: Secondary | ICD-10-CM

## 2023-11-04 DIAGNOSIS — G2581 Restless legs syndrome: Secondary | ICD-10-CM

## 2023-11-04 MED ORDER — PRAMIPEXOLE DIHYDROCHLORIDE 0.25 MG PO TABS: 0.2500 mg | ORAL_TABLET | Freq: Two times a day (BID) | ORAL | 0 refills | Status: DC

## 2023-11-08 ENCOUNTER — Telehealth: Admitting: Physician Assistant

## 2023-11-08 ENCOUNTER — Encounter: Payer: Self-pay | Admitting: Physician Assistant

## 2023-11-08 ENCOUNTER — Encounter: Payer: Self-pay | Admitting: Family Medicine

## 2023-11-08 DIAGNOSIS — F4312 Post-traumatic stress disorder, chronic: Secondary | ICD-10-CM | POA: Diagnosis not present

## 2023-11-08 DIAGNOSIS — F429 Obsessive-compulsive disorder, unspecified: Secondary | ICD-10-CM | POA: Diagnosis not present

## 2023-11-08 DIAGNOSIS — F509 Eating disorder, unspecified: Secondary | ICD-10-CM | POA: Diagnosis not present

## 2023-11-08 DIAGNOSIS — F99 Mental disorder, not otherwise specified: Secondary | ICD-10-CM | POA: Diagnosis not present

## 2023-11-08 DIAGNOSIS — G2581 Restless legs syndrome: Secondary | ICD-10-CM | POA: Diagnosis not present

## 2023-11-08 DIAGNOSIS — Z87891 Personal history of nicotine dependence: Secondary | ICD-10-CM

## 2023-11-08 DIAGNOSIS — F411 Generalized anxiety disorder: Secondary | ICD-10-CM

## 2023-11-08 DIAGNOSIS — F5105 Insomnia due to other mental disorder: Secondary | ICD-10-CM

## 2023-11-08 DIAGNOSIS — F319 Bipolar disorder, unspecified: Secondary | ICD-10-CM | POA: Diagnosis not present

## 2023-11-08 MED ORDER — TRAZODONE HCL 100 MG PO TABS: 50.0000 mg | ORAL_TABLET | Freq: Every evening | ORAL | 11 refills | Status: AC | PRN

## 2023-11-08 MED ORDER — REXULTI 4 MG PO TABS: 4.0000 mg | ORAL_TABLET | Freq: Every morning | ORAL | 11 refills | Status: AC

## 2023-11-08 MED ORDER — CLONAZEPAM 0.5 MG PO TABS: 0.5000 mg | ORAL_TABLET | Freq: Three times a day (TID) | ORAL | 1 refills | Status: AC | PRN

## 2023-11-08 NOTE — Progress Notes (Signed)
 [facility]  Patient ID: Jennifer [name],  MRN: [id number]  PCP: Tobie Suzzane POUR, MD  Date of Evaluation: 11/08/2023 Time spent:25 minutes  Chief Complaint:   Chief Complaint   Anxiety; Depression; Insomnia; Follow-up    Virtual Visit via Telehealth  I connected with patient by a video enabled telemedicine application with their informed consent, and verified patient privacy and that I am speaking with the correct person using two identifiers.  I am private, in my office and the patient is at home.  I discussed the limitations, risks, security and privacy concerns of performing an evaluation and management service by video and the availability of in person appointments. I also discussed with the patient that there may be a patient responsible charge related to this service. The patient expressed understanding and agreed to proceed.   I discussed the assessment and treatment plan with the patient. The patient was provided an opportunity to ask questions and all were answered. The patient agreed with the plan and demonstrated an understanding of the instructions.   The patient was advised to call back or seek an in-person evaluation if the symptoms worsen or if the condition fails to improve as anticipated.  I provided approximately  25 minutes of non-face-to-face time during this encounter.  HISTORY/CURRENT STATUS: HPI for routine med check  Jennifer [name] is doing well overall.  She discontinued the mirtazapine  a little over a week ago because she feels like it was making her hungrier.  Her appetite has decreased a little bit since she stopped it.  When she took it in the past she did not notice the increased appetite.  She has stopped purging which may be a factor.  It has been 28 days since she has purged!  1 reason that she has stopped is issues with her bottom two front teeth, they seem to be decaying.  She has a dentist appointment later this week to discuss.  As far as her  mental health goes she feels like her medications are working well.  She has times where she gets depressed but it seems to be triggered.  She gets depressed about her weight and is trying hard to lose.  When she does get depressed, it only last a few days at the most. Anxiety is still a problem, gets overstimulated at times and can't calm down. The Klonopin  is helpful.  She does not take it often.  OCD is no worse.  She was recently started on pramipexole  for restless leg syndrome.  It is helping.  She has a follow-up appointment with neuro this week.  Since she stopped the mirtazapine , she is not sleeping quite as well.  The pramipexole  is probably helping some though.   She still enjoys sewing.  It helps keep her mind off things.  Energy and motivation are good.  No extreme sadness, tearfulness, or feelings of hopelessness.  ADLs and personal hygiene are normal.   Denies any changes in concentration, making decisions, or remembering things.  No SI/HI.  No reports of increased energy with decreased need for sleep, increased talkativeness, racing thoughts, impulsivity or risky behaviors, increased spending, increased libido, grandiosity, increased irritability or anger, paranoia, or hallucinations.  Denies dizziness, syncope, seizures, numbness, tingling, tremor, tics, unsteady gait, slurred speech, confusion. Denies muscle or joint pain, stiffness, or dystonia.  Individual Medical History/ Review of Systems: Changes? :No        Past medications for mental health diagnoses include: Risperdal, Seroquel, Prozac, Zoloft,  Wellbutrin, Lamictal ,  Depakote  caused hair loss, Xanax , Ambien , trazodone , Trileptal, Luvox , Topamax, Elavil, Pamelor, BuSpar, doxazosin, prazosin, lithium, Vraylar , Abilify  caused increased appetite, caused weight gain which is contraindicated w/ her eating d/o, and didn't work for sx, [name] , Latuda  >60 mg caused abd pain and nausea, propranolol  caused diarrhea.  Allergies: Codeine,  Penicillins, Depakote  [divalproex  sodium], Mobic [meloxicam], Sonata [zaleplon], Topamax [topiramate], Aciphex  [rabeprazole ], Aimovig  [erenumab -aooe], and Emgality [galcanezumab-gnlm]  Current Medications:  Current Outpatient Medications:    albuterol  (VENTOLIN  HFA) 108 (90 Base) MCG/ACT inhaler, Inhale 2 puffs into the lungs every 6 (six) hours as needed for wheezing or shortness of breath., Disp: 18 g, Rfl: 5   colestipol  (COLESTID ) 1 g tablet, TAKE 4 TABLETS (4 GRAMS TOTAL) BY MOUTH TWO TIMES DAILY, Disp: 720 tablet, Rfl: 2   esomeprazole  (NEXIUM ) 40 MG capsule, Take 1 capsule (40 mg total) by mouth daily., Disp: 90 capsule, Rfl: 3   Famotidine -Ca Carb-Mag Hydrox (PEPCID  COMPLETE PO), Take 1 tablet by mouth in the morning., Disp: , Rfl:    fluticasone  (FLONASE ) 50 MCG/ACT nasal spray, Place 2 sprays into both nostrils daily., Disp: 16 g, Rfl: 6   fluvoxaMINE  (LUVOX ) 100 MG tablet, Take 2 tablets (200 mg total) by mouth 2 (two) times daily., Disp: 360 tablet, Rfl: 3   gabapentin  (NEURONTIN ) 600 MG tablet, 2 p.o. every morning, 1 p.o. q. afternoon, 2 p.o. nightly., Disp: 150 tablet, Rfl: 11   hydrOXYzine  (ATARAX ) 25 MG tablet, Take 1-2 tablets (25-50 mg total) by mouth every 8 (eight) hours as needed., Disp: 90 tablet, Rfl: 11   hyoscyamine  (LEVSIN /SL) 0.125 MG SL tablet, Place 1 tablet (0.125 mg total) under the tongue every 4 (four) hours as needed (for abdominal cramping/pain)., Disp: 60 tablet, Rfl: 1   loratadine (CLARITIN) 10 MG tablet, Take 10 mg by mouth in the morning and at bedtime. Morning & afternoon., Disp: , Rfl:    naproxen  sodium (ALEVE ) 220 MG tablet, Take 660 mg by mouth daily as needed (migraine)., Disp: , Rfl:    ondansetron  (ZOFRAN -ODT) 4 MG disintegrating tablet, Take 1 tablet (4 mg total) by mouth every 8 (eight) hours as needed for nausea or vomiting., Disp: 30 tablet, Rfl: 0   pramipexole  (MIRAPEX ) 0.25 MG tablet, Take 1 tablet (0.25 mg total) by mouth 2 (two) times daily.,  Disp: 60 tablet, Rfl: 0   Probiotic Product (DAILY PROBIOTIC PO), Take 2 capsules by mouth daily with lunch., Disp: , Rfl:    RESTASIS  0.05 % ophthalmic emulsion, Place 1 drop into both eyes 2 (two) times daily., Disp: , Rfl:    triamcinolone  ointment (KENALOG ) 0.1 %, Apply 1 Application topically 2 (two) times daily., Disp: , Rfl:    Ubrogepant  (UBRELVY ) 100 MG TABS, Take 100 mg by mouth with or without food. If needed, a second dose may be taken at least 2 hours after the initial dose. The maximum dose in a 24-hour period is 200 mg., Disp: 10 tablet, Rfl: 11   valACYclovir  (VALTREX ) 500 MG tablet, Take 500 mg by mouth daily at 12 noon., Disp: , Rfl:    Brexpiprazole  ([name] ) 4 MG TABS, Take 1 tablet (4 mg total) by mouth in the morning., Disp: 30 tablet, Rfl: 11   clonazePAM  (KLONOPIN ) 0.5 MG tablet, Take 1 tablet (0.5 mg total) by mouth 3 (three) times daily as needed for anxiety., Disp: 30 tablet, Rfl: 1   traZODone  (DESYREL ) 100 MG tablet, Take 0.5-2 tablets (50-200 mg total) by mouth at bedtime as needed for sleep., Disp: 60  tablet, Rfl: 11 Medication Side Effects: none  Family Medical/ Social History: Changes?  No  MENTAL HEALTH EXAM:  Last menstrual period 10/07/2011.There is no height or weight on file to calculate BMI.  General Appearance: Casual and Well Groomed  Eye Contact:  Good  Speech:  Clear and Coherent and Normal Rate  Volume:  Normal  Mood:  Euthymic  Affect:  Congruent  Thought Process:  Goal Directed and Descriptions of Associations: Circumstantial  Orientation:  Full (Time, Place, and Person)  Thought Content: Logical   Suicidal Thoughts:  No  Homicidal Thoughts:  No  Memory:  Immediate;   Fair Recent;   Fair Remote;   Good  Judgement:  Good  Insight:  Good  Psychomotor Activity:  Normal  Concentration:  Concentration: Good and Attention Span: Good  Recall:  Good  Fund of Knowledge: Good  Language: Good  Assets:  Communication Skills Desire for  Improvement Financial Resources/Insurance Housing Leisure Time Resilience Transportation  ADL's:  Intact  Cognition: WNL  Prognosis:  Good   PCP follows labs.  DIAGNOSES:    ICD-10-CM   1. Bipolar I disorder (HCC)  F31.9     2. Obsessive-compulsive disorder, unspecified type  F42.9     3. Generalized anxiety disorder  F41.1     4. Restless leg syndrome  G25.81     5. Insomnia due to other mental disorder  F51.05    F99     6. Former smoker  Z87.891     7. Eating disorder, unspecified type  F50.9      Receiving Psychotherapy: Yes  Lauraine Jester, LCMSW at [facility], Elijah Sciara for brain spotting  RECOMMENDATIONS:  PDMP was reviewed.  Gabapentin  filled 10/25/2023.  Klonopin  filled 06/28/2023. I provided approximately 25 minutes of non-face-to-face time during this encounter, including time spent before and after the visit in records review, medical decision making, counseling pertinent to today's visit, and charting.   Congratulations on 28 days free of purging!  Keep up the good work.  She has already discontinued mirtazapine  which is fine.  We were using it mostly for akathisia, at least when first starting it a few years ago.  Also for insomnia.  She can go back on the trazodone  if she needs that for sleep.  The pramipexole  may be enough however.  If akathisia recurs she should let me know and we will find another treatment.  Hopefully we will not need to.  Overall she is doing really well so no changes will be made.  Continue [name]  4 mg, 1 p.o. every morning. Continue Klonopin  0.5 mg, 1 p.o. twice daily prn. Continue Luvox  100 mg, 2 p.o. twice daily.   Continue gabapentin  600 mg, 2 qam.,  1 p.o. q. afternoon, and 2 p.o. nightly.   Continue hydroxyzine  25-50 mg, 1 p.o. 3 times daily as needed. Continue pramipexole  0.25 mg, 1 p.o. twice daily.  Per neuro. Restart trazodone  100 mg, one half up to 2 pills nightly as needed sleep. Continue therapy. Return  in 2 months.  Verneita Cooks, PA-C
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
## 2023-11-09 NOTE — Patient Instructions (Signed)
 Below is our plan:  We will continue Ubrelvy  as needed. Continue pramipexole  0.25 up to twice daily. Wean caffeine by 50% every week. Water  water  water !  Please make sure you are staying well hydrated. I recommend 50-60 ounces daily. Well balanced diet and regular exercise encouraged. Consistent sleep schedule with 6-8 hours recommended.   Please continue follow up with care team as directed.   Follow up with me in 1 year   You may receive a survey regarding today's visit. I encourage you to leave honest feed back as I do use this information to improve patient care. Thank you for seeing me today!   GENERAL HEADACHE INFORMATION:   Natural supplements: Magnesium  Oxide or Magnesium  Glycinate 500 mg at bed (up to 800 mg daily) Coenzyme Q10 300 mg in AM Vitamin B2- 200 mg twice a day   Add 1 supplement at a time since even natural supplements can have undesirable side effects. You can sometimes buy supplements cheaper (especially Coenzyme Q10) at www.WebmailGuide.co.za or at [facility].  Migraine with aura: There is increased risk for stroke in women with migraine with aura and a contraindication for the combined contraceptive pill for use by women who have migraine with aura. The risk for women with migraine without aura is lower. However other risk factors like smoking are far more likely to increase stroke risk than migraine. There is a recommendation for no smoking and for the use of OCPs without estrogen such as progestogen only pills particularly for women with migraine with aura.SABRA People who have migraine headaches with auras may be 3 times more likely to have a stroke caused by a blood clot, compared to migraine patients who don't see auras. Women who take hormone-replacement therapy may be 30 percent more likely to suffer a clot-based stroke than women not taking medication containing estrogen. Other risk factors like smoking and high blood pressure may be  much more important.    Vitamins and herbs  that show potential:   Magnesium : Magnesium  (250 mg twice a day or 500 mg at bed) has a relaxant effect on smooth muscles such as blood vessels. Individuals suffering from frequent or daily headache usually have low magnesium  levels which can be increase with daily supplementation of 400-750 mg. Three trials found 40-90% average headache reduction  when used as a preventative. Magnesium  may help with headaches are aura, the best evidence for magnesium  is for migraine with aura is its thought to stop the cortical spreading depression we believe is the pathophysiology of migraine aura.Magnesium  also demonstrated the benefit in menstrually related migraine.  Magnesium  is part of the messenger system in the serotonin cascade and it is a good muscle relaxant.  It is also useful for constipation which can be a side effect of other medications used to treat migraine. Good sources include nuts, whole grains, and tomatoes. Side Effects: loose stool/diarrhea  Riboflavin (vitamin B 2) 200 mg twice a day. This vitamin assists nerve cells in the production of ATP a principal energy storing molecule.  It is necessary for many chemical reactions in the body.  There have been at least 3 clinical trials of riboflavin using 400 mg per day all of which suggested that migraine frequency can be decreased.  All 3 trials showed significant improvement in over half of migraine sufferers.  The supplement is found in bread, cereal, milk, meat, and poultry.  Most Americans get more riboflavin than the recommended daily allowance, however riboflavin deficiency is not necessary for the supplements  to help prevent headache. Side effects: energizing, green urine   Coenzyme Q10: This is present in almost all cells in the body and is critical component for the conversion of energy.  Recent studies have shown that a nutritional supplement of CoQ10 can reduce the frequency of migraine attacks by improving the energy production of cells as with  riboflavin.  Doses of 150 mg twice a day have been shown to be effective.   Melatonin: Increasing evidence shows correlation between melatonin secretion and headache conditions.  Melatonin supplementation has decreased headache intensity and duration.  It is widely used as a sleep aid.  Sleep is natures way of dealing with migraine.  A dose of 3 mg is recommended to start for headaches including cluster headache. Higher doses up to 15 mg has been reviewed for use in Cluster headache and have been used. The rationale behind using melatonin for cluster is that many theories regarding the cause of Cluster headache center around the disruption of the normal circadian rhythm in the brain.  This helps restore the normal circadian rhythm.   HEADACHE DIET: Foods and beverages which may trigger migraine Note that only 20% of headache patients are food sensitive. You will know if you are food sensitive if you get a headache consistently 20 minutes to 2 hours after eating a certain food. Only cut out a food if it causes headaches, otherwise you might remove foods you enjoy! What matters most for diet is to eat a well balanced healthy diet full of vegetables and low fat protein, and to not miss meals.   Chocolate, other sweets ALL cheeses except cottage and cream cheese Dairy products, yogurt, sour cream, ice cream Liver Meat extracts (Bovril, Marmite, meat tenderizers) Meats or fish which have undergone aging, fermenting, pickling or smoking. These include: Hotdogs,salami,Lox,sausage, mortadellas,smoked salmon, pepperoni, Pickled herring Pods of broad bean (English beans, Chinese pea pods, [place] (fava) beans, lima and navy beans Ripe avocado, ripe banana Yeast extracts or active yeast preparations such as Brewer's or Fleishman's (commercial bakes goods are permitted) Tomato based foods, pizza (lasagna, etc.)   MSG (monosodium glutamate) is disguised as many things; look for these common  aliases: Monopotassium glutamate Autolysed yeast Hydrolysed protein Sodium caseinate "flavorings" "all natural preservatives Nutrasweet   Avoid all other foods that convincingly provoke headaches.   Resources: The Dizzy Bluford Aid Your Headache Diet, migrainestrong.com  https://zamora-andrews.com/   Caffeine and Migraine For patients that have migraine, caffeine intake more than 3 days per week can lead to dependency and increased migraine frequency. I would recommend cutting back on your caffeine intake as best you can. The recommended amount of caffeine is 200-300 mg daily, although migraine patients may experience dependency at even lower doses. While you may notice an increase in headache temporarily, cutting back will be helpful for headaches in the long run. For more information on caffeine and migraine, visit: https://americanmigrainefoundation.org/resource-library/caffeine-and-migraine/   Headache Prevention Strategies:   1. Maintain a headache diary; learn to identify and avoid triggers.  - This can be a simple note where you log when you had a headache, associated symptoms, and medications used - There are several smartphone apps developed to help track migraines: Migraine Buddy, Migraine Monitor, Curelator N1-Headache App   Common triggers include: Emotional triggers: Emotional/Upset family or friends Emotional/Upset occupation Business reversal/success Anticipation anxiety Crisis-serious Post-crisis periodNew job/position   Physical triggers: Vacation Day Weekend Strenuous Exercise High Altitude Location New Move Menstrual Day Physical Illness Oversleep/Not enough sleep Weather changes Light: Photophobia  or light sesnitivity treatment involves a balance between desensitization and reduction in overly strong input. Use dark polarized glasses outside, but not inside. Avoid bright or fluorescent light, but do not dim  environment to the point that going into a normally lit room hurts. Consider FL-41 tint lenses, which reduce the most irritating wavelengths without blocking too much light.  These can be obtained at axonoptics.com or theraspecs.com Foods: see list above.   2. Limit use of acute treatments (over-the-counter medications, triptans, etc.) to no more than 2 days per week or 10 days per month to prevent medication overuse headache (rebound headache).     3. Follow a regular schedule (including weekends and holidays): Don't skip meals. Eat a balanced diet. 8 hours of sleep nightly. Minimize stress. Exercise 30 minutes per day. Being overweight is associated with a 5 times increased risk of chronic migraine. Keep well hydrated and drink 6-8 glasses of water  per day.   4. Initiate non-pharmacologic measures at the earliest onset of your headache. Rest and quiet environment. Relax and reduce stress. Breathe2Relax is a free app that can instruct you on    some simple relaxtion and breathing techniques. Http://Dawnbuse.com is a    free website that provides teaching videos on relaxation.  Also, there are  many apps that   can be downloaded for "mindful" relaxation.  An app called YOGA NIDRA will help walk you through mindfulness. Another app called Calm can be downloaded to give you a structured mindfulness guide with daily reminders and skill development. Headspace for guided meditation Mindfulness Based Stress Reduction Online Course: www.palousemindfulness.com Cold compresses.   5. Don't wait!! Take the maximum allowable dosage of prescribed medication at the first sign of migraine.   6. Compliance:  Take prescribed medication regularly as directed and at the first sign of a migraine.   7. Communicate:  Call your physician when problems arise, especially if your headaches change, increase in frequency/severity, or become associated with neurological symptoms (weakness, numbness, slurred speech, etc.).  Proceed to emergency room if you experience new or worsening symptoms or symptoms do not resolve, if you have new neurologic symptoms or if headache is severe, or for any concerning symptom.   8. Headache/pain management therapies: Consider various complementary methods, including medication, behavioral therapy, psychological counselling, biofeedback, massage therapy, acupuncture, dry needling, and other modalities.  Such measures may reduce the need for medications. Counseling for pain management, where patients learn to function and ignore/minimize their pain, seems to work very well.   9. Recommend changing family's attention and focus away from patient's headaches. Instead, emphasize daily activities. If first question of day is 'How are your headaches/Do you have a headache today?', then patient will constantly think about headaches, thus making them worse. Goal is to re-direct attention away from headaches, toward daily activities and other distractions.   10. Helpful Websites: www.AmericanHeadacheSociety.org PatentHood.ch www.headaches.org TightMarket.nl www.achenet.org

## 2023-11-11 ENCOUNTER — Ambulatory Visit: Payer: Medicare PPO | Admitting: Family Medicine

## 2023-11-11 ENCOUNTER — Encounter: Payer: Self-pay | Admitting: Family Medicine

## 2023-11-11 VITALS — BP 116/80 | HR 94 | Ht 64.0 in | Wt 179.5 lb

## 2023-11-11 DIAGNOSIS — G43709 Chronic migraine without aura, not intractable, without status migrainosus: Secondary | ICD-10-CM | POA: Diagnosis not present

## 2023-11-11 DIAGNOSIS — G2581 Restless legs syndrome: Secondary | ICD-10-CM

## 2023-11-11 MED ORDER — PRAMIPEXOLE DIHYDROCHLORIDE 0.25 MG PO TABS: 0.2500 mg | ORAL_TABLET | Freq: Two times a day (BID) | ORAL | 3 refills | Status: AC

## 2023-11-11 MED ORDER — UBRELVY 100 MG PO TABS: ORAL_TABLET | ORAL | 11 refills | Status: AC

## 2023-11-17 DIAGNOSIS — F4310 Post-traumatic stress disorder, unspecified: Secondary | ICD-10-CM | POA: Diagnosis not present

## 2023-11-17 DIAGNOSIS — F319 Bipolar disorder, unspecified: Secondary | ICD-10-CM | POA: Diagnosis not present

## 2023-11-17 DIAGNOSIS — F429 Obsessive-compulsive disorder, unspecified: Secondary | ICD-10-CM | POA: Diagnosis not present

## 2023-11-22 ENCOUNTER — Other Ambulatory Visit: Payer: Self-pay | Admitting: Gastroenterology

## 2023-11-22 ENCOUNTER — Encounter: Payer: Self-pay | Admitting: Gastroenterology

## 2023-11-22 DIAGNOSIS — Z1231 Encounter for screening mammogram for malignant neoplasm of breast: Secondary | ICD-10-CM | POA: Diagnosis not present

## 2023-11-22 DIAGNOSIS — R197 Diarrhea, unspecified: Secondary | ICD-10-CM

## 2023-11-23 DIAGNOSIS — R197 Diarrhea, unspecified: Secondary | ICD-10-CM | POA: Diagnosis not present

## 2023-11-24 LAB — CLOSTRIDIUM DIFFICILE BY PCR: Toxigenic C. Difficile by PCR: NEGATIVE

## 2023-11-25 ENCOUNTER — Ambulatory Visit: Payer: Self-pay | Admitting: Gastroenterology

## 2023-11-25 LAB — GI PROFILE, STOOL, PCR

## 2023-12-01 DIAGNOSIS — F429 Obsessive-compulsive disorder, unspecified: Secondary | ICD-10-CM | POA: Diagnosis not present

## 2023-12-01 DIAGNOSIS — F319 Bipolar disorder, unspecified: Secondary | ICD-10-CM | POA: Diagnosis not present

## 2023-12-01 DIAGNOSIS — F4310 Post-traumatic stress disorder, unspecified: Secondary | ICD-10-CM | POA: Diagnosis not present

## 2023-12-02 ENCOUNTER — Encounter: Payer: Self-pay | Admitting: Physician Assistant

## 2023-12-02 ENCOUNTER — Ambulatory Visit (INDEPENDENT_AMBULATORY_CARE_PROVIDER_SITE_OTHER): Admitting: Physician Assistant

## 2023-12-02 ENCOUNTER — Telehealth: Payer: Self-pay | Admitting: Physician Assistant

## 2023-12-02 ENCOUNTER — Other Ambulatory Visit: Payer: Self-pay

## 2023-12-02 DIAGNOSIS — F411 Generalized anxiety disorder: Secondary | ICD-10-CM

## 2023-12-02 DIAGNOSIS — G2581 Restless legs syndrome: Secondary | ICD-10-CM

## 2023-12-02 DIAGNOSIS — F5105 Insomnia due to other mental disorder: Secondary | ICD-10-CM | POA: Diagnosis not present

## 2023-12-02 DIAGNOSIS — F429 Obsessive-compulsive disorder, unspecified: Secondary | ICD-10-CM | POA: Diagnosis not present

## 2023-12-02 DIAGNOSIS — F319 Bipolar disorder, unspecified: Secondary | ICD-10-CM

## 2023-12-02 DIAGNOSIS — K529 Noninfective gastroenteritis and colitis, unspecified: Secondary | ICD-10-CM | POA: Diagnosis not present

## 2023-12-02 DIAGNOSIS — F99 Mental disorder, not otherwise specified: Secondary | ICD-10-CM | POA: Diagnosis not present

## 2023-12-02 MED ORDER — GABAPENTIN 300 MG PO CAPS: ORAL_CAPSULE | ORAL | 0 refills | Status: DC

## 2023-12-02 MED ORDER — GABAPENTIN 400 MG PO TABS: ORAL_TABLET | ORAL | 1 refills | Status: DC

## 2023-12-02 NOTE — Progress Notes (Signed)
 [facility]  Patient ID: Jennifer [name],  MRN: [id number]  PCP: Tobie Suzzane POUR, MD  Date of Evaluation: 12/02/2023 Time spent:25 minutes  Chief Complaint:   Chief Complaint   Medication Problem    HISTORY/CURRENT STATUS: HPI needs to discuss her medications.  [name] has had diarrhea for several years.  She has seen GI multiple times and they are not sure the cause.  She has been diagnosed with IBS.  The diarrhea has been so severe lately that she can hardly go out of her house for fear of an accident.  She is having to take sitz bath's often because of the inflammation.  She wonders if any of her medications could be causing the problem.  She looked up the side effects and has found that Luvox , gabapentin , and [name]  can cause diarrhea.  She decreased the Luvox  from 400 mg to 300 mg about a week ago.  She felt a little bit depressed but not bad and has had no increased anxiety or symptoms of OCD.  At the same time she decreased the gabapentin  from a total of 3000/day to 2400/day and is now on 1800 mg a day just starting yesterday.  The [name]  has been extremely helpful and she does not want to mess with that if possible.    Patient is able to enjoy things.  Energy and motivation are ok.  No extreme sadness, tearfulness, or feelings of hopelessness.  Sleeps ok.  ADLs and personal hygiene are normal.   Denies any changes in concentration, making decisions, or remembering things.  Appetite has not changed.  Weight is stable.  No reports of binging or purging.  No mania, delirium, AH/VH.  No SI/HI.  Individual Medical History/ Review of Systems: Changes? :Yes   see HPI     Past medications for mental health diagnoses include: Risperdal, Seroquel, Prozac, Zoloft,  Wellbutrin, Lamictal , Depakote  caused hair loss, Xanax , Ambien , trazodone , Trileptal, Luvox , Topamax, Elavil, Pamelor, BuSpar, doxazosin, prazosin, lithium, Vraylar , Abilify  caused increased appetite, caused weight gain  which is contraindicated w/ her eating d/o, and didn't work for sx, [name] , Latuda  >60 mg caused abd pain and nausea, propranolol  caused diarrhea.  Allergies: Codeine, Penicillins, Depakote  [divalproex  sodium], Mobic [meloxicam], Sonata [zaleplon], Topamax [topiramate], Aciphex  [rabeprazole ], Aimovig  [erenumab -aooe], and Emgality [galcanezumab-gnlm]  Current Medications:  Current Outpatient Medications:    albuterol  (VENTOLIN  HFA) 108 (90 Base) MCG/ACT inhaler, Inhale 2 puffs into the lungs every 6 (six) hours as needed for wheezing or shortness of breath., Disp: 18 g, Rfl: 5   Brexpiprazole  ([name] ) 4 MG TABS, Take 1 tablet (4 mg total) by mouth in the morning., Disp: 30 tablet, Rfl: 11   clonazePAM  (KLONOPIN ) 0.5 MG tablet, Take 1 tablet (0.5 mg total) by mouth 3 (three) times daily as needed for anxiety., Disp: 30 tablet, Rfl: 1   colestipol  (COLESTID ) 1 g tablet, TAKE 4 TABLETS (4 GRAMS TOTAL) BY MOUTH TWO TIMES DAILY, Disp: 720 tablet, Rfl: 2   esomeprazole  (NEXIUM ) 40 MG capsule, Take 1 capsule (40 mg total) by mouth daily., Disp: 90 capsule, Rfl: 3   Famotidine -Ca Carb-Mag Hydrox (PEPCID  COMPLETE PO), Take 1 tablet by mouth in the morning., Disp: , Rfl:    fluticasone  (FLONASE ) 50 MCG/ACT nasal spray, Place 2 sprays into both nostrils daily., Disp: 16 g, Rfl: 6   fluvoxaMINE  (LUVOX ) 100 MG tablet, Take 2 tablets (200 mg total) by mouth 2 (two) times daily. (Patient taking differently: Take 300 mg by mouth at bedtime. 3 po  at bedtime.), Disp: 360 tablet, Rfl: 3   gabapentin  (NEURONTIN ) 400 MG tablet, Take 3 1/2 pills in divided doses for 1 weeks,  then 1 po tid., Disp: 90 tablet, Rfl: 1   hydrOXYzine  (ATARAX ) 25 MG tablet, Take 1-2 tablets (25-50 mg total) by mouth every 8 (eight) hours as needed., Disp: 90 tablet, Rfl: 11   hyoscyamine  (LEVSIN /SL) 0.125 MG SL tablet, Place 1 tablet (0.125 mg total) under the tongue every 4 (four) hours as needed (for abdominal cramping/pain)., Disp: 60  tablet, Rfl: 1   loratadine (CLARITIN) 10 MG tablet, Take 10 mg by mouth in the morning and at bedtime. Morning & afternoon., Disp: , Rfl:    naproxen  sodium (ALEVE ) 220 MG tablet, Take 660 mg by mouth daily as needed (migraine)., Disp: , Rfl:    ondansetron  (ZOFRAN -ODT) 4 MG disintegrating tablet, Take 1 tablet (4 mg total) by mouth every 8 (eight) hours as needed for nausea or vomiting., Disp: 30 tablet, Rfl: 0   pramipexole  (MIRAPEX ) 0.25 MG tablet, Take 1 tablet (0.25 mg total) by mouth 2 (two) times daily., Disp: 180 tablet, Rfl: 3   Probiotic Product (DAILY PROBIOTIC PO), Take 2 capsules by mouth daily with lunch., Disp: , Rfl:    RESTASIS  0.05 % ophthalmic emulsion, Place 1 drop into both eyes 2 (two) times daily., Disp: , Rfl:    traZODone  (DESYREL ) 100 MG tablet, Take 0.5-2 tablets (50-200 mg total) by mouth at bedtime as needed for sleep., Disp: 60 tablet, Rfl: 11   triamcinolone  ointment (KENALOG ) 0.1 %, Apply 1 Application topically 2 (two) times daily., Disp: , Rfl:    TYRVAYA 0.03 MG/ACT SOLN, , Disp: , Rfl:    Ubrogepant  (UBRELVY ) 100 MG TABS, Take 100 mg by mouth with or without food. If needed, a second dose may be taken at least 2 hours after the initial dose. The maximum dose in a 24-hour period is 200 mg., Disp: 10 tablet, Rfl: 11   valACYclovir  (VALTREX ) 500 MG tablet, Take 500 mg by mouth daily at 12 noon., Disp: , Rfl:  Medication Side Effects: none  Family Medical/ Social History: Changes?  No  MENTAL HEALTH EXAM:  Last menstrual period 10/07/2011.There is no height or weight on file to calculate BMI.  General Appearance: Casual and Well Groomed  Eye Contact:  Good  Speech:  Clear and Coherent and Normal Rate  Volume:  Normal  Mood:  Euthymic  Affect:  Congruent  Thought Process:  Goal Directed and Descriptions of Associations: Circumstantial  Orientation:  Full (Time, Place, and Person)  Thought Content: Logical   Suicidal Thoughts:  No  Homicidal Thoughts:  No   Memory:  Immediate;   Fair Recent;   Fair Remote;   Good  Judgement:  Good  Insight:  Good  Psychomotor Activity:  Normal  Concentration:  Concentration: Good and Attention Span: Good  Recall:  Good  Fund of Knowledge: Good  Language: Good  Assets:  Communication Skills Desire for Improvement Financial Resources/Insurance Housing Resilience Transportation  ADL's:  Intact  Cognition: WNL  Prognosis:  Good   PCP follows labs.  DIAGNOSES:    ICD-10-CM   1. Obsessive-compulsive disorder, unspecified type  F42.9     2. Generalized anxiety disorder  F41.1     3. Bipolar I disorder (HCC)  F31.9     4. Restless leg syndrome  G25.81     5. Insomnia due to other mental disorder  F51.05    F99  6. Chronic diarrhea  K52.9       Receiving Psychotherapy: Yes  Lauraine Jester, LCMSW at [facility] reconnections, Eli[facility] Sciara for brain spotting  RECOMMENDATIONS:  PDMP was reviewed.  Gabapentin  filled 10/25/2023.  Klonopin  filled 11/08/2023. I provided approximately 20 minutes of face to face time during this encounter, including time spent before and after the visit in records review, medical decision making, counseling pertinent to today's visit, and charting.   We discussed the diarrhea.  It is possible it could be related to 1 or more of her mental health medications.  We would need to change 1 thing at a time to try and pinpoint the offending medication.  We agree for her to drop the gabapentin  from 1800 mg in divided doses down to 1600 mg daily for 1 week, then decrease to 1200 mg daily.  We will leave the Luvox  at 300 mg total but if the OCD worsens we may have to bump that back to a total of 400 mg.  If she has generalized anxiety or feels panicky just take the Klonopin .   Continue [name]  4 mg, 1 p.o. every morning. Continue Klonopin  0.5 mg, 1 p.o. twice daily prn. Continue Luvox  300 mg, q hs.  Decrease gabapentin  to 400 mg, 3.5 pills daily in divided doses for 1 week and  then decrease to 1 p.o. 3 times daily. Continue hydroxyzine  25-50 mg, 1 p.o. 3 times daily as needed. Continue pramipexole  0.25 mg, 1 p.o. twice daily.  Per neuro. Continue trazodone  100 mg, one half up to 2 pills nightly as needed sleep. Continue therapy. Return in 3 weeks.   Verneita Cooks, PA-C

## 2023-12-02 NOTE — Telephone Encounter (Signed)
 Pt seen yesterday and new Rx for gabapentin  sent.  Decrease gabapentin  to 400 mg, 3.5 pills daily in divided doses for 1 week and then decrease to 1 p.o. 3 times daily.   Tablets only come in 300, 600, and 800 mg dosing; capsules 100, 300, and 400.

## 2023-12-02 NOTE — Telephone Encounter (Signed)
 Oh ok.  The 400 mg tabs came up on Epic.  Please send in 300 mg tablets, # 90,  2 p.o. 3 times daily for 1 week then decrease to 1 p.o. every morning, 2 p.o. at lunch and 2 p.o. at bedtime for 1 week and then decrease to 2 p.o. twice daily.

## 2023-12-02 NOTE — Telephone Encounter (Signed)
 Gabapentin  Rx sent as requested.

## 2023-12-02 NOTE — Telephone Encounter (Signed)
 [facility] pharmacy called advising Rx for Gabapentin  400 mg tablets.  Take 3 1/2. Only comes in capsules. Advise [phone]

## 2023-12-14 ENCOUNTER — Telehealth: Payer: Self-pay | Admitting: Physician Assistant

## 2023-12-14 NOTE — Telephone Encounter (Signed)
 Pt said he is having a heard time finding 4mg  rexulti . [facility] and will ask them but if not she wants to come pick up samples tomorrow.

## 2023-12-14 NOTE — Telephone Encounter (Signed)
 Traci called rep and they are going to bring some 4 mg samples this afternoon. Pt notified and she will come around lunch tomorrow to pick up. [facility] [place] and neither has available in 4 mg. Per Traci two 2 mg tablets will require a PA.

## 2023-12-14 NOTE — Telephone Encounter (Signed)
 Pt cannot get 4mg  [name]  but this [facility] has [name]  2mg  . Can we send in a supply of 2mg  to take 2 per day and see if she can pick it up?  [facility] [address] [facility] [place], [place]

## 2023-12-20 ENCOUNTER — Telehealth: Payer: Self-pay | Admitting: Physician Assistant

## 2023-12-20 NOTE — Telephone Encounter (Signed)
 Jennifer [name] lvm that she lowered the gabapentin  but she is still experiencing having break downs. She has gone up on the luvox . She wants to know if  she can go back up on the gabapentin . She is running low and will need a new script. Please call her at [phone]

## 2023-12-21 ENCOUNTER — Telehealth: Payer: Self-pay | Admitting: Physician Assistant

## 2023-12-21 NOTE — Telephone Encounter (Signed)
 Patient called at 10:30a stating she called yesterday and not received a call back.  Looks like the previous encounter was signed, but there's no update.  She would like someone to call her back asap.  Next appt 12/8

## 2023-12-21 NOTE — Telephone Encounter (Signed)
 There were 2 messages and this was addressed in another message.

## 2023-12-21 NOTE — Telephone Encounter (Signed)
 Pt tried to taper down her gabapentin  and she said her OCD is out of control and she wants to go back to 1800 mg daily. She said right now that is working for her. She said she has IBS and she has diarrhea at times for no apparent reason and she doesn't feel that it was the gabapentin  or fluvoxamine  that was causing it.  She had an issue getting her 4 mg of [name]  also and she felt like that triggered her. I gave her samples of 2 mg to get her a couple of weeks, but she still had an issue getting it. Traci called [facility] and he brought some 4 mg samples by. Pt said she talked to the pharmacist versus one of the techs and he told her he could order it and got it in 2 days. She has an appt with you next week and thinks she has enough gabapentin  to get her to that appt. If ok to increase dose back to 1800 will send RF if needed before then.

## 2023-12-22 MED ORDER — GABAPENTIN 600 MG PO TABS: 1200.0000 mg | ORAL_TABLET | Freq: Two times a day (BID) | ORAL | 0 refills | Status: DC

## 2023-12-22 NOTE — Telephone Encounter (Signed)
 Called patient back to tell her it was ok to go back to 1800 mg gabapentin . She said she is taking 2400 mg.

## 2023-12-22 NOTE — Telephone Encounter (Signed)
 Yes it's ok to increase Gabapentin  to 1800 mg daily.  Thanks.

## 2023-12-22 NOTE — Telephone Encounter (Signed)
 Ok to increase Gabapentin  to total of 2,400 mg/day.  Restart 600 mg, 2 bid.

## 2023-12-22 NOTE — Telephone Encounter (Signed)
 New Rx sent for gabapentin , 600 mg, 2 po BID. Patient notified.

## 2023-12-22 NOTE — Telephone Encounter (Signed)
 Pt LVM @ 10:11a stating this is the 3rd day she has called about the Gabapentin  600mg .  She said she talked to Bridget and Teresa needed to sign off on it.  She said she needs the medication.  Send to   [facility] - [place], [place] - [address], [place] [address] Phone: [phone]  Fax: [phone]    Next appt 12/8

## 2023-12-27 ENCOUNTER — Encounter: Payer: Self-pay | Admitting: Physician Assistant

## 2023-12-27 ENCOUNTER — Telehealth: Admitting: Physician Assistant

## 2023-12-27 DIAGNOSIS — F411 Generalized anxiety disorder: Secondary | ICD-10-CM | POA: Diagnosis not present

## 2023-12-27 DIAGNOSIS — F319 Bipolar disorder, unspecified: Secondary | ICD-10-CM

## 2023-12-27 DIAGNOSIS — F429 Obsessive-compulsive disorder, unspecified: Secondary | ICD-10-CM | POA: Diagnosis not present

## 2023-12-27 DIAGNOSIS — G2581 Restless legs syndrome: Secondary | ICD-10-CM | POA: Diagnosis not present

## 2023-12-27 MED ORDER — GABAPENTIN 600 MG PO TABS: 1200.0000 mg | ORAL_TABLET | Freq: Two times a day (BID) | ORAL | 5 refills | Status: AC

## 2023-12-27 NOTE — Progress Notes (Signed)
 [facility]  Patient ID: Jennifer [name],  MRN: [id number]  PCP: Tobie Suzzane POUR, MD  Date of Evaluation: 12/27/2023 Time spent:20 minutes  Chief Complaint:   Chief Complaint   Anxiety; Depression; Insomnia; Follow-up    Virtual Visit via Telehealth  I connected with patient by a video enabled telemedicine application with their informed consent, and verified patient privacy and that I am speaking with the correct person using two identifiers.  I am private, in my office and the patient is at home.  I discussed the limitations, risks, security and privacy concerns of performing an evaluation and management service by video and the availability of in person appointments. I also discussed with the patient that there may be a patient responsible charge related to this service. The patient expressed understanding and agreed to proceed.   I discussed the assessment and treatment plan with the patient. The patient was provided an opportunity to ask questions and all were answered. The patient agreed with the plan and demonstrated an understanding of the instructions.   The patient was advised to call back or seek an in-person evaluation if the symptoms worsen or if the condition fails to improve as anticipated.  I provided approximately 20  minutes of non-face-to-face time during this encounter.  HISTORY/CURRENT STATUS: HPI routine med check.  We decreased the Luvox  and gabapentin  at the last visit, thinking they may be the cause of diarrhea.  It did not improve after the dose change, and the OCD got much worse.  She was starting to lighten things up in the kitchen, it drove her crazy if things were not in order.  Had more racing thoughts.  We went back up on both meds and the OCD symptoms have improved.  She still has ruminating thoughts but they are back to her baseline.  The diarrhea comes and goes.  She thinks it is related to the IBS.  Imodium  helps.    She feels good  overall. She is able to enjoy things.  Likes to sew.  Energy and motivation are good.  No extreme sadness, tearfulness, or feelings of hopelessness.  Sleeps well most of the time. ADLs and personal hygiene are normal.   Denies any changes in concentration, making decisions, or remembering things.  Appetite has not changed.  Generalized anxiety is controlled.  Klonopin  is helpful when needed.  No mania, delirium, AH/VH.  No SI/HI.  Individual Medical History/ Review of Systems: Changes? :No        Past medications for mental health diagnoses include: Risperdal, Seroquel, Prozac, Zoloft,  Wellbutrin, Lamictal , Depakote  caused hair loss, Xanax , Ambien , trazodone , Trileptal, Luvox , Topamax, Elavil, Pamelor, BuSpar, doxazosin, prazosin, lithium, Vraylar , Abilify  caused increased appetite, caused weight gain which is contraindicated w/ her eating d/o, and didn't work for sx, [name] , Latuda  >60 mg caused abd pain and nausea, propranolol  caused diarrhea.  Allergies: Codeine, Penicillins, Depakote  [divalproex  sodium], Mobic [meloxicam], Sonata [zaleplon], Topamax [topiramate], Aciphex  [rabeprazole ], Aimovig  [erenumab -aooe], and Emgality [galcanezumab-gnlm]  Current Medications:  Current Outpatient Medications:    albuterol  (VENTOLIN  HFA) 108 (90 Base) MCG/ACT inhaler, Inhale 2 puffs into the lungs every 6 (six) hours as needed for wheezing or shortness of breath., Disp: 18 g, Rfl: 5   Brexpiprazole  ([name] ) 4 MG TABS, Take 1 tablet (4 mg total) by mouth in the morning., Disp: 30 tablet, Rfl: 11   clonazePAM  (KLONOPIN ) 0.5 MG tablet, Take 1 tablet (0.5 mg total) by mouth 3 (three) times daily as needed for anxiety., Disp:  30 tablet, Rfl: 1   colestipol  (COLESTID ) 1 g tablet, TAKE 4 TABLETS (4 GRAMS TOTAL) BY MOUTH TWO TIMES DAILY, Disp: 720 tablet, Rfl: 2   esomeprazole  (NEXIUM ) 40 MG capsule, Take 1 capsule (40 mg total) by mouth daily., Disp: 90 capsule, Rfl: 3   Famotidine -Ca Carb-Mag Hydrox (PEPCID   COMPLETE PO), Take 1 tablet by mouth in the morning., Disp: , Rfl:    fluticasone  (FLONASE ) 50 MCG/ACT nasal spray, Place 2 sprays into both nostrils daily., Disp: 16 g, Rfl: 6   fluvoxaMINE  (LUVOX ) 100 MG tablet, Take 2 tablets (200 mg total) by mouth 2 (two) times daily., Disp: 360 tablet, Rfl: 3   hydrOXYzine  (ATARAX ) 25 MG tablet, Take 1-2 tablets (25-50 mg total) by mouth every 8 (eight) hours as needed., Disp: 90 tablet, Rfl: 11   hyoscyamine  (LEVSIN /SL) 0.125 MG SL tablet, Place 1 tablet (0.125 mg total) under the tongue every 4 (four) hours as needed (for abdominal cramping/pain)., Disp: 60 tablet, Rfl: 1   loratadine (CLARITIN) 10 MG tablet, Take 10 mg by mouth in the morning and at bedtime. Morning & afternoon., Disp: , Rfl:    naproxen  sodium (ALEVE ) 220 MG tablet, Take 660 mg by mouth daily as needed (migraine)., Disp: , Rfl:    ondansetron  (ZOFRAN -ODT) 4 MG disintegrating tablet, Take 1 tablet (4 mg total) by mouth every 8 (eight) hours as needed for nausea or vomiting., Disp: 30 tablet, Rfl: 0   pramipexole  (MIRAPEX ) 0.25 MG tablet, Take 1 tablet (0.25 mg total) by mouth 2 (two) times daily., Disp: 180 tablet, Rfl: 3   Probiotic Product (DAILY PROBIOTIC PO), Take 2 capsules by mouth daily with lunch., Disp: , Rfl:    RESTASIS  0.05 % ophthalmic emulsion, Place 1 drop into both eyes 2 (two) times daily., Disp: , Rfl:    traZODone  (DESYREL ) 100 MG tablet, Take 0.5-2 tablets (50-200 mg total) by mouth at bedtime as needed for sleep., Disp: 60 tablet, Rfl: 11   triamcinolone  ointment (KENALOG ) 0.1 %, Apply 1 Application topically 2 (two) times daily., Disp: , Rfl:    TYRVAYA 0.03 MG/ACT SOLN, , Disp: , Rfl:    Ubrogepant  (UBRELVY ) 100 MG TABS, Take 100 mg by mouth with or without food. If needed, a second dose may be taken at least 2 hours after the initial dose. The maximum dose in a 24-hour period is 200 mg., Disp: 10 tablet, Rfl: 11   valACYclovir  (VALTREX ) 500 MG tablet, Take 500 mg by  mouth daily at 12 noon., Disp: , Rfl:    gabapentin  (NEURONTIN ) 600 MG tablet, Take 2 tablets (1,200 mg total) by mouth 2 (two) times daily., Disp: 120 tablet, Rfl: 5 Medication Side Effects: none  Family Medical/ Social History: Changes?  No  MENTAL HEALTH EXAM:  Last menstrual period 10/07/2011.There is no height or weight on file to calculate BMI.  General Appearance: Casual and Well Groomed  Eye Contact:  Good  Speech:  Clear and Coherent and Normal Rate  Volume:  Normal  Mood:  Euthymic  Affect:  Congruent  Thought Process:  Goal Directed and Descriptions of Associations: Circumstantial  Orientation:  Full (Time, Place, and Person)  Thought Content: Logical   Suicidal Thoughts:  No  Homicidal Thoughts:  No  Memory:  Immediate;   Fair Recent;   Fair Remote;   Good  Judgement:  Good  Insight:  Good  Psychomotor Activity:  Normal  Concentration:  Concentration: Good and Attention Span: Good  Recall:  Good  Fund  of Knowledge: Good  Language: Good  Assets:  Communication Skills Desire for Improvement Financial Resources/Insurance Housing Leisure Time Resilience Transportation  ADL's:  Intact  Cognition: WNL  Prognosis:  Good   PCP follows labs.  DIAGNOSES:    ICD-10-CM   1. Obsessive-compulsive disorder, unspecified type  F42.9     2. Bipolar I disorder (HCC)  F31.9     3. Generalized anxiety disorder  F41.1     4. Restless leg syndrome  G25.81       Receiving Psychotherapy: Yes  Lauraine Jester, LCMSW at [facility], Joanne [facility] for brain spotting  RECOMMENDATIONS:  PDMP was reviewed.  Gabapentin  filled 12/22/2023.  Klonopin  filled 11/08/2023. I provided approximately  20 minutes of non-face-to-face time during this encounter, including time spent before and after the visit in records review, medical decision making, counseling pertinent to today's visit, and charting.   She is doing better now that she is back on the previous doses of Luvox   and gabapentin .  No changes will be made.  Continue [name]  4 mg, 1 p.o. every morning. Continue Klonopin  0.5 mg, 1 p.o. twice daily prn. Continue Luvox  200 mg, 1 p.o. bid.  Continue gabapentin  600 mg, 2 p.o. twice daily. Continue hydroxyzine  25-50 mg, 1 p.o. 3 times daily as needed. Continue pramipexole  0.25 mg, 1 p.o. twice daily.  Per neuro. Continue trazodone  100 mg, one half up to 2 pills nightly as needed sleep. Continue therapy. Return in 3 months.   Verneita Cooks, PA-C

## 2023-12-29 DIAGNOSIS — F319 Bipolar disorder, unspecified: Secondary | ICD-10-CM | POA: Diagnosis not present

## 2023-12-29 DIAGNOSIS — F429 Obsessive-compulsive disorder, unspecified: Secondary | ICD-10-CM | POA: Diagnosis not present

## 2024-01-05 ENCOUNTER — Ambulatory Visit: Admitting: Podiatry

## 2024-01-28 ENCOUNTER — Encounter: Payer: Self-pay | Admitting: Family Medicine

## 2024-01-29 ENCOUNTER — Encounter: Payer: Self-pay | Admitting: Family Medicine

## 2024-02-20 ENCOUNTER — Encounter (INDEPENDENT_AMBULATORY_CARE_PROVIDER_SITE_OTHER): Payer: Self-pay

## 2024-02-22 ENCOUNTER — Ambulatory Visit: Admitting: Internal Medicine

## 2024-02-22 ENCOUNTER — Telehealth: Admitting: Nurse Practitioner

## 2024-02-22 DIAGNOSIS — B349 Viral infection, unspecified: Secondary | ICD-10-CM

## 2024-02-22 DIAGNOSIS — J209 Acute bronchitis, unspecified: Secondary | ICD-10-CM

## 2024-02-22 MED ORDER — ONDANSETRON 4 MG PO TBDP: 4.0000 mg | ORAL_TABLET | Freq: Three times a day (TID) | ORAL | 0 refills | Status: AC | PRN

## 2024-02-22 MED ORDER — AIRSUPRA 90-80 MCG/ACT IN AERO: 1.0000 | INHALATION_SPRAY | RESPIRATORY_TRACT | 0 refills | Status: AC | PRN

## 2024-02-22 MED ORDER — PROMETHAZINE-DM 6.25-15 MG/5ML PO SYRP: 5.0000 mL | ORAL_SOLUTION | Freq: Four times a day (QID) | ORAL | 0 refills | Status: AC | PRN

## 2024-02-22 MED ORDER — FLUTICASONE PROPIONATE 50 MCG/ACT NA SUSP: 2.0000 | Freq: Every day | NASAL | 0 refills | Status: AC

## 2024-02-22 NOTE — Patient Instructions (Signed)
 " Jennifer [name], thank you for joining Comer LULLA Rouleau, NP for today's virtual visit.  While this provider is not your primary care provider (PCP), if your PCP is located in our provider database this encounter information will be shared with them immediately following your visit.   A [facility] MyChart account gives you access to today's visit and all your visits, tests, and labs performed at [facility]  click here if you don't have a  MyChart account or go to [email or web address]  Consent: (Patient) Jennifer [name] provided verbal consent for this virtual visit at the beginning of the encounter.  Current Medications:  Current Outpatient Medications:    albuterol  (VENTOLIN  HFA) 108 (90 Base) MCG/ACT inhaler, Inhale 2 puffs into the lungs every 6 (six) hours as needed for wheezing or shortness of breath., Disp: 18 g, Rfl: 5   Albuterol -Budesonide (AIRSUPRA ) 90-80 MCG/ACT AERO, Inhale 1-2 puffs into the lungs every 4 (four) hours as needed (shortness of breath, wheezing, coughing spells)., Disp: 10.7 g, Rfl: 0   Brexpiprazole  ([name] ) 4 MG TABS, Take 1 tablet (4 mg total) by mouth in the morning., Disp: 30 tablet, Rfl: 11   clonazePAM  (KLONOPIN ) 0.5 MG tablet, Take 1 tablet (0.5 mg total) by mouth 3 (three) times daily as needed for anxiety., Disp: 30 tablet, Rfl: 1   colestipol  (COLESTID ) 1 g tablet, TAKE 4 TABLETS (4 GRAMS TOTAL) BY MOUTH TWO TIMES DAILY, Disp: 720 tablet, Rfl: 2   esomeprazole  (NEXIUM ) 40 MG capsule, Take 1 capsule (40 mg total) by mouth daily., Disp: 90 capsule, Rfl: 3   Famotidine -Ca Carb-Mag Hydrox (PEPCID  COMPLETE PO), Take 1 tablet by mouth in the morning., Disp: , Rfl:    fluticasone  (FLONASE ) 50 MCG/ACT nasal spray, Place 2 sprays into both nostrils daily., Disp: 18.2 mL, Rfl: 0   fluvoxaMINE  (LUVOX ) 100 MG tablet, Take 2 tablets (200 mg total) by mouth 2 (two) times daily., Disp: 360 tablet, Rfl: 3   gabapentin  (NEURONTIN ) 600 MG tablet,  Take 2 tablets (1,200 mg total) by mouth 2 (two) times daily., Disp: 120 tablet, Rfl: 5   hydrOXYzine  (ATARAX ) 25 MG tablet, Take 1-2 tablets (25-50 mg total) by mouth every 8 (eight) hours as needed., Disp: 90 tablet, Rfl: 11   hyoscyamine  (LEVSIN /SL) 0.125 MG SL tablet, Place 1 tablet (0.125 mg total) under the tongue every 4 (four) hours as needed (for abdominal cramping/pain)., Disp: 60 tablet, Rfl: 1   loratadine (CLARITIN) 10 MG tablet, Take 10 mg by mouth in the morning and at bedtime. Morning & afternoon., Disp: , Rfl:    naproxen  sodium (ALEVE ) 220 MG tablet, Take 660 mg by mouth daily as needed (migraine)., Disp: , Rfl:    ondansetron  (ZOFRAN -ODT) 4 MG disintegrating tablet, Take 1 tablet (4 mg total) by mouth every 8 (eight) hours as needed for nausea or vomiting., Disp: 30 tablet, Rfl: 0   pramipexole  (MIRAPEX ) 0.25 MG tablet, Take 1 tablet (0.25 mg total) by mouth 2 (two) times daily., Disp: 180 tablet, Rfl: 3   Probiotic Product (DAILY PROBIOTIC PO), Take 2 capsules by mouth daily with lunch., Disp: , Rfl:    promethazine -dextromethorphan (PROMETHAZINE -DM) 6.25-15 MG/5ML syrup, Take 5 mLs by mouth 4 (four) times daily as needed for cough., Disp: 180 mL, Rfl: 0   traZODone  (DESYREL ) 100 MG tablet, Take 0.5-2 tablets (50-200 mg total) by mouth at bedtime as needed for sleep., Disp: 60 tablet, Rfl: 11   TYRVAYA 0.03 MG/ACT SOLN, , Disp: , Rfl:  Ubrogepant  (UBRELVY ) 100 MG TABS, Take 100 mg by mouth with or without food. If needed, a second dose may be taken at least 2 hours after the initial dose. The maximum dose in a 24-hour period is 200 mg., Disp: 10 tablet, Rfl: 11   valACYclovir  (VALTREX ) 500 MG tablet, Take 500 mg by mouth daily at 12 noon., Disp: , Rfl:    Medications ordered in this encounter:  Meds ordered this encounter  Medications   fluticasone  (FLONASE ) 50 MCG/ACT nasal spray    Sig: Place 2 sprays into both nostrils daily.    Dispense:  18.2 mL    Refill:  0    promethazine -dextromethorphan (PROMETHAZINE -DM) 6.25-15 MG/5ML syrup    Sig: Take 5 mLs by mouth 4 (four) times daily as needed for cough.    Dispense:  180 mL    Refill:  0   ondansetron  (ZOFRAN -ODT) 4 MG disintegrating tablet    Sig: Take 1 tablet (4 mg total) by mouth every 8 (eight) hours as needed for nausea or vomiting.    Dispense:  30 tablet    Refill:  0   Albuterol -Budesonide (AIRSUPRA ) 90-80 MCG/ACT AERO    Sig: Inhale 1-2 puffs into the lungs every 4 (four) hours as needed (shortness of breath, wheezing, coughing spells).    Dispense:  10.7 g    Refill:  0     *If you need refills on other medications prior to your next appointment, please contact your pharmacy*  Follow-Up: Call back or seek an in-person evaluation if the symptoms worsen or if the condition fails to improve as anticipated.   Other Instructions Based on what you have shared with me, it looks like you may have a viral infection also causing bronchitis, which is inflammation in your airways. Symptoms vary from person to person, with common symptoms including sore throat, cough, and fatigue or lack of energy, and a feeling of general discomfort.  A low-grade fever of up to 100.4 may present, but is often uncommon.  Symptoms vary however, and are closely related to a person's age or underlying illnesses.  The most common symptoms associated with an upper respiratory infection are nasal discharge or congestion, cough, sneezing, headache and pressure in the ears and face.  These symptoms usually persist for about 3 to 10 days, but can last up to 2 weeks.  It is important to know that upper respiratory infections do not cause serious illness or complications in most cases.    For nasal congestion, you may use an oral decongestants such as Mucinex D or if you have glaucoma or high blood pressure use plain Mucinex.  Saline nasal spray or nasal drops can help and can safely be used as often as needed for congestion.  For  your congestion, I have prescribed Fluticasone  nasal spray 2 spray in each nostril once a day  For cough I have prescribed for you A prescription cough medication called Promethazine -DM 6.25-15 mg/5 mL. Take 5 mL every 6 hours as needed for cough. This will also help with your nausea.  For headache, pain, or general discomfort, you can use Ibuprofen  or Tylenol  as directed.   Some authorities believe that zinc sprays or the use of Echinacea may shorten the course of your symptoms.  I have also sent in an inhaler, called Airsupra  for you to use since you have had difficulties with prednisone . Use 1-2 puffs every 4-6 hours as needed for shortness of breath, wheezing, chest tightness, coughing spells. Recommend using  2-3 times a day until symptoms improve then as needed. Do not use your albuterol  within 4 hours of use of the Airsupra  as it has albuterol  in it. Clean mouth well after use.   Based on what you have shared with me it looks like you have a Virus that is irritating your GI tract.   I have prescribed a medication that will help alleviate your symptoms and allow you to stay hydrated: Zofran  4 mg 1 tablet every 8 hours as needed for nausea and vomiting   HOME CARE Only take medications as instructed by your medical team. Be sure to drink plenty of fluids. Water  is fine as well as fruit juices, sodas and electrolyte beverages. You may want to stay away from caffeine or alcohol. If you are nauseated, try taking small sips of liquids. How do you know if you are getting enough fluid? Your urine should be a pale yellow or almost colorless. Get rest. Taking a steamy shower or using a humidifier may help nasal congestion and ease sore throat pain. You can place a towel over your head and breathe in the steam from hot water  coming from a faucet. Using a saline nasal spray works much the same way. Cough drops, hard candies and sore throat lozenges may ease your cough. Avoid close contacts especially  the very young and the elderly Cover your mouth if you cough or sneeze Always remember to wash your hands.  Drink clear liquids.  This is very important! Dehydration (the lack of fluid) can lead to a serious complication.  Start off with 1 tablespoon every 5 minutes for 8 hours. You may begin eating bland foods after 8 hours without vomiting.  Start with saltine crackers, white bread, rice, mashed potatoes, applesauce. After 48 hours on a bland diet, you may resume a normal diet. Try to go to sleep.  Sleep often empties the stomach and relieves the need to vomit.  GET HELP RIGHT AWAY IF: You develop worsening fever. If your symptoms do not improve within 10 days You develop yellow or green discharge from your nose over 3 days. You have coughing fits You develop a severe head ache or visual changes. You develop shortness of breath, difficulty breathing or start having chest pain Your symptoms persist after you have completed your treatment plan       If you have been instructed to have an in-person evaluation today at a local Urgent Care facility, please use the link below. It will take you to a list of all of our available [facility], including address, phone number and hours of operation. Please do not delay care.  [facility]  If you or a family member do not have a primary care provider, use the link below to schedule a visit and establish care. When you choose a [facility] primary care physician or advanced practice provider, you gain a long-term partner in health. Find a Primary Care Provider  Learn more about [facility]'s in-office and virtual care options: [facility] - Get Care Now  "

## 2024-02-23 ENCOUNTER — Ambulatory Visit

## 2024-02-28 ENCOUNTER — Ambulatory Visit: Payer: Self-pay | Admitting: Family Medicine

## 2024-02-29 ENCOUNTER — Ambulatory Visit: Admitting: Gastroenterology

## 2024-03-20 ENCOUNTER — Ambulatory Visit: Payer: Medicare PPO

## 2024-03-27 ENCOUNTER — Telehealth: Admitting: Physician Assistant

## 2024-04-07 ENCOUNTER — Ambulatory Visit: Admitting: Internal Medicine

## 2024-12-05 ENCOUNTER — Ambulatory Visit: Admitting: Family Medicine
